# Patient Record
Sex: Male | Born: 1955 | Race: Black or African American | Hispanic: No | Marital: Married | Smoking: Former smoker
Health system: Southern US, Community
[De-identification: ages and names within clinical notes are randomized; demographics above are authoritative.]

---

## 1998-05-21 ENCOUNTER — Emergency Department (HOSPITAL_COMMUNITY): Admission: EM | Admit: 1998-05-21 | Discharge: 1998-05-21 | Payer: Self-pay | Admitting: Emergency Medicine

## 1998-05-21 ENCOUNTER — Encounter: Payer: Self-pay | Admitting: Emergency Medicine

## 1998-08-21 ENCOUNTER — Inpatient Hospital Stay (HOSPITAL_COMMUNITY): Admission: EM | Admit: 1998-08-21 | Discharge: 1998-08-31 | Payer: Self-pay | Admitting: Emergency Medicine

## 1998-08-21 ENCOUNTER — Encounter: Payer: Self-pay | Admitting: Emergency Medicine

## 1998-09-10 ENCOUNTER — Inpatient Hospital Stay (HOSPITAL_COMMUNITY): Admission: EM | Admit: 1998-09-10 | Discharge: 1998-09-25 | Payer: Self-pay | Admitting: Emergency Medicine

## 1998-09-10 ENCOUNTER — Encounter: Admission: RE | Admit: 1998-09-10 | Discharge: 1998-09-10 | Payer: Self-pay | Admitting: Internal Medicine

## 1998-09-10 ENCOUNTER — Ambulatory Visit (HOSPITAL_COMMUNITY): Admission: RE | Admit: 1998-09-10 | Discharge: 1998-09-10 | Payer: Self-pay | Admitting: Internal Medicine

## 1998-10-01 ENCOUNTER — Encounter: Admission: RE | Admit: 1998-10-01 | Discharge: 1998-10-01 | Payer: Self-pay

## 1998-11-12 ENCOUNTER — Encounter: Admission: RE | Admit: 1998-11-12 | Discharge: 1998-11-12 | Payer: Self-pay | Admitting: Internal Medicine

## 1999-01-21 ENCOUNTER — Ambulatory Visit (HOSPITAL_COMMUNITY): Admission: RE | Admit: 1999-01-21 | Discharge: 1999-01-21 | Payer: Self-pay | Admitting: Internal Medicine

## 1999-01-21 ENCOUNTER — Encounter: Admission: RE | Admit: 1999-01-21 | Discharge: 1999-01-21 | Payer: Self-pay | Admitting: Internal Medicine

## 1999-02-18 ENCOUNTER — Encounter: Admission: RE | Admit: 1999-02-18 | Discharge: 1999-02-18 | Payer: Self-pay | Admitting: Internal Medicine

## 1999-03-05 ENCOUNTER — Emergency Department (HOSPITAL_COMMUNITY): Admission: EM | Admit: 1999-03-05 | Discharge: 1999-03-05 | Payer: Self-pay | Admitting: *Deleted

## 1999-03-05 ENCOUNTER — Encounter: Payer: Self-pay | Admitting: *Deleted

## 1999-04-22 ENCOUNTER — Encounter: Admission: RE | Admit: 1999-04-22 | Discharge: 1999-04-22 | Payer: Self-pay | Admitting: Internal Medicine

## 1999-04-22 ENCOUNTER — Ambulatory Visit (HOSPITAL_COMMUNITY): Admission: RE | Admit: 1999-04-22 | Discharge: 1999-04-22 | Payer: Self-pay | Admitting: Internal Medicine

## 1999-04-27 ENCOUNTER — Inpatient Hospital Stay (HOSPITAL_COMMUNITY): Admission: EM | Admit: 1999-04-27 | Discharge: 1999-05-02 | Payer: Self-pay | Admitting: Emergency Medicine

## 1999-04-27 ENCOUNTER — Encounter: Payer: Self-pay | Admitting: Internal Medicine

## 1999-08-28 ENCOUNTER — Inpatient Hospital Stay (HOSPITAL_COMMUNITY): Admission: EM | Admit: 1999-08-28 | Discharge: 1999-08-31 | Payer: Self-pay | Admitting: Emergency Medicine

## 1999-08-28 ENCOUNTER — Encounter: Payer: Self-pay | Admitting: Internal Medicine

## 1999-08-29 ENCOUNTER — Encounter: Payer: Self-pay | Admitting: Internal Medicine

## 1999-10-19 ENCOUNTER — Inpatient Hospital Stay (HOSPITAL_COMMUNITY): Admission: EM | Admit: 1999-10-19 | Discharge: 1999-10-22 | Payer: Self-pay | Admitting: *Deleted

## 1999-10-19 ENCOUNTER — Encounter: Payer: Self-pay | Admitting: Emergency Medicine

## 1999-11-10 ENCOUNTER — Inpatient Hospital Stay (HOSPITAL_COMMUNITY): Admission: EM | Admit: 1999-11-10 | Discharge: 1999-11-13 | Payer: Self-pay | Admitting: Emergency Medicine

## 1999-11-10 ENCOUNTER — Encounter: Payer: Self-pay | Admitting: Emergency Medicine

## 1999-11-14 ENCOUNTER — Inpatient Hospital Stay (HOSPITAL_COMMUNITY): Admission: EM | Admit: 1999-11-14 | Discharge: 1999-11-20 | Payer: Self-pay | Admitting: Emergency Medicine

## 1999-11-25 ENCOUNTER — Encounter: Payer: Self-pay | Admitting: Emergency Medicine

## 1999-11-25 ENCOUNTER — Inpatient Hospital Stay (HOSPITAL_COMMUNITY): Admission: EM | Admit: 1999-11-25 | Discharge: 1999-11-29 | Payer: Self-pay | Admitting: *Deleted

## 1999-11-25 ENCOUNTER — Encounter: Payer: Self-pay | Admitting: *Deleted

## 1999-11-28 ENCOUNTER — Encounter: Payer: Self-pay | Admitting: Internal Medicine

## 2020-03-02 ENCOUNTER — Emergency Department (HOSPITAL_COMMUNITY)
Admission: EM | Admit: 2020-03-02 | Discharge: 2020-03-03 | Disposition: A | Discharge status: Home / Self Care | Payer: Medicaid Other | Attending: Emergency Medicine | Admitting: Emergency Medicine

## 2020-03-02 DIAGNOSIS — F10129 Alcohol abuse with intoxication, unspecified: Secondary | ICD-10-CM | POA: Diagnosis not present

## 2020-03-02 DIAGNOSIS — F1010 Alcohol abuse, uncomplicated: Secondary | ICD-10-CM

## 2020-03-02 DIAGNOSIS — U071 COVID-19: Secondary | ICD-10-CM | POA: Diagnosis not present

## 2020-03-02 DIAGNOSIS — T50901A Poisoning by unspecified drugs, medicaments and biological substances, accidental (unintentional), initial encounter: Secondary | ICD-10-CM

## 2020-03-02 DIAGNOSIS — T40601A Poisoning by unspecified narcotics, accidental (unintentional), initial encounter: Secondary | ICD-10-CM | POA: Diagnosis present

## 2020-03-02 NOTE — ED Provider Notes (Signed)
[facility] EMERGENCY DEPARTMENT Provider Note   CSN: [id number] Arrival date & time: 03/02/20  2341     History Chief Complaint - overdose  Level 5 caveat due to altered mental status  Ricky [name] is a 65 y.o. male.  The history is provided by the EMS personnel and the patient. The history is limited by the condition of the patient.  Drug Overdose This is a new problem. The problem occurs constantly. The problem has been gradually improving. Nothing aggravates the symptoms. Relieved by: Narcan.  Patient presents for presumed opiate overdose.  EMS reports they were called to the house by family and patient was unresponsive.  He may have received compressions from family.  EMS reports the patient had agonal respirations and was given assisted ventilation and then given Narcan.  After several minutes he began to wake up and became mildly agitated  Patient reports he "likes to party" but is unable to provide any other details and appears intoxicated        PMH-unknown Soc hx - unknown Home Medications Prior to Admission medications   Not on File    Allergies    Patient has no allergy information on record.  Review of Systems   Review of Systems  Unable to perform ROS: Mental status change    Physical Exam Updated Vital Signs There were no vitals taken for this visit.  Physical Exam CONSTITUTIONAL: Disheveled, mildly anxious HEAD: Normocephalic/atraumatic EYES: Pinpoint pupils bilaterally ENMT: Mucous membranes moist NECK: supple no meningeal signs SPINE/BACK:entire spine nontender CV: S1/S2 noted, no murmurs/rubs/gallops noted LUNGS: Lungs are clear to auscultation bilaterally, no apparent distress Chest-no tenderness or crepitus ABDOMEN: soft, nontender, no rebound or guarding, bowel sounds noted throughout abdomen GU:no cva tenderness NEURO: Pt is awake/alert, moves all extremitiesx4.  No facial droop.  Patient is slurring his speech and  appears intoxicated EXTREMITIES: pulses normal/equal, full ROM, no deformities SKIN: warm, color normal PSYCH: Anxious  ED Results / Procedures / Treatments   Labs (all labs ordered are listed, but only abnormal results are displayed) Labs Reviewed  SARS CORONAVIRUS 2 BY RT PCR (HOSPITAL ORDER, [facility]) - Abnormal; Notable for the following components:      Result Value   SARS Coronavirus 2 POSITIVE (*)    All other components within normal limits  COMPREHENSIVE METABOLIC PANEL - Abnormal; Notable for the following components:   Glucose, Bld 150 (*)    Calcium 8.7 (*)    Total Protein 8.2 (*)    Albumin 3.3 (*)    AST 45 (*)    Alkaline Phosphatase 132 (*)    All other components within normal limits  SALICYLATE LEVEL - Abnormal; Notable for the following components:   Salicylate Lvl <5.9 (*)    All other components within normal limits  ACETAMINOPHEN LEVEL - Abnormal; Notable for the following components:   Acetaminophen (Tylenol), Serum <10 (*)    All other components within normal limits  ETHANOL - Abnormal; Notable for the following components:   Alcohol, Ethyl (B) 124 (*)    All other components within normal limits  RAPID URINE DRUG SCREEN, HOSP PERFORMED - Abnormal; Notable for the following components:   Cocaine POSITIVE (*)    Amphetamines POSITIVE (*)    All other components within normal limits  CBC WITH DIFFERENTIAL/PLATELET - Abnormal; Notable for the following components:   RBC 3.95 (*)    MCH 34.4 (*)    Lymphs Abs 0.5 (*)  Abs Immature Granulocytes 0.08 (*)    All other components within normal limits    EKG EKG Interpretation  Date/Time:  Saturday March 03 2020 00:06:25 EST Ventricular Rate:  85 PR Interval:    QRS Duration: 84 QT Interval:  372 QTC Calculation: 443 R Axis:   81 Text Interpretation: Sinus rhythm Borderline right axis deviation Borderline repolarization abnormality No significant change since last  tracing Confirmed by Ripley Fraise [phone]) on 03/03/2020 12:12:29 AM   Radiology DG Chest Port 1 View  Result Date: 03/03/2020 CLINICAL DATA:  Pt states has had dizziness, chest pain, shortness of breath and possibly a fever for a week. EXAM: PORTABLE CHEST 1 VIEW.  Patient is rotated. COMPARISON:  Chest x-ray 10/19/1999 report without images FINDINGS: The heart size and mediastinal contours are within normal limits. Aortic arch calcifications. Left upper lobe patchy airspace opacities. Associated bronchial wall cuffing. No focal consolidation. No pulmonary edema. No pleural effusion. No pneumothorax. No acute osseous abnormality. IMPRESSION: Left upper lobe findings likely represent aspiration pneumonia. Electronically Signed   By: Iven Finn M.D.   On: 03/03/2020 00:28    Procedures Procedures   Medications Ordered in ED Medications  remdesivir 200 mg in sodium chloride 0.9% 250 mL IVPB (has no administration in time range)    Followed by  remdesivir 100 mg in sodium chloride 0.9 % 100 mL IVPB (has no administration in time range)  diphenhydrAMINE (BENADRYL) capsule 25 mg (25 mg Oral Given 03/03/20 0228)    ED Course  I have reviewed the triage vital signs and the nursing notes.  Pertinent labs & imaging results that were available during my care of the patient were reviewed by me and considered in my medical decision making (see chart for details).    MDM Rules/Calculators/A&P                          11:59 PM Patient presents after presumed narcotic overdose as he improved with Narcan.  He is awake alert and speaking but appears intoxicated.  Will obtain labs and chest x-ray since he may have received CPR.  We will continue to monitor at this time Prehospital EKG was reviewed and no acute findings 2:18 AM Patient is now awake and alert.  His speech is much more clear.  He is not agitated.  He does report itching throughout his body, will give Benadryl.  X-ray findings are  noted.  Patient will be tested for COVID-19 5:32 AM Patient positive for COVID-19.  This is likely the cause of his pneumonia on CXR.  Overall he feels improved.  He walks around the room in no acute distress. No cp/sob is reported He has no significant sustained hypoxia.  On room air while at rest he is in the mid 90s. He now tells me that he was vaccinated for Covid with [place] vaccine last year I feel he is appropriate for discharge home.  Will give 1 dose of remdesivir and refer to outpatient infusion center Patient advised to isolate for the next 5 days as he is unsure when his symptoms started. Counseled patient to avoid illegal substances  Ricky [name] was evaluated in Emergency Department on 03/03/2020 for the symptoms described in the history of present illness. He was evaluated in the context of the global COVID-19 pandemic, which necessitated consideration that the patient might be at risk for infection with the SARS-CoV-2 virus that causes COVID-19. Institutional protocols and algorithms  that pertain to the evaluation of patients at risk for COVID-19 are in a state of rapid change based on information released by regulatory bodies including the CDC and federal and state organizations. These policies and algorithms were followed during the patient's care in the ED.  Final Clinical Impression(s) / ED Diagnoses Final diagnoses:  Accidental drug overdose, initial encounter  Alcohol abuse  COVID-19    Rx / DC Orders ED Discharge Orders    None       Ripley Fraise, MD 03/03/20 [phone]

## 2020-03-03 ENCOUNTER — Emergency Department (HOSPITAL_COMMUNITY): Payer: Medicaid Other

## 2020-03-03 ENCOUNTER — Other Ambulatory Visit (HOSPITAL_COMMUNITY): Payer: Self-pay

## 2020-03-03 LAB — CBC WITH DIFFERENTIAL/PLATELET
Abs Immature Granulocytes: 0.08 10*3/uL — ABNORMAL HIGH (ref 0.00–0.07)
Basophils Absolute: 0 10*3/uL (ref 0.0–0.1)
Basophils Relative: 0 %
Eosinophils Absolute: 0.1 10*3/uL (ref 0.0–0.5)
Eosinophils Relative: 3 %
HCT: 39.5 % (ref 39.0–52.0)
Hemoglobin: 13.6 g/dL (ref 13.0–17.0)
Immature Granulocytes: 2 %
Lymphocytes Relative: 12 %
Lymphs Abs: 0.5 10*3/uL — ABNORMAL LOW (ref 0.7–4.0)
MCH: 34.4 pg — ABNORMAL HIGH (ref 26.0–34.0)
MCHC: 34.4 g/dL (ref 30.0–36.0)
MCV: 100 fL (ref 80.0–100.0)
Monocytes Absolute: 0.5 10*3/uL (ref 0.1–1.0)
Monocytes Relative: 11 %
Neutro Abs: 3.1 10*3/uL (ref 1.7–7.7)
Neutrophils Relative %: 72 %
Platelets: 311 10*3/uL (ref 150–400)
RBC: 3.95 MIL/uL — ABNORMAL LOW (ref 4.22–5.81)
RDW: 12.6 % (ref 11.5–15.5)
WBC: 4.3 10*3/uL (ref 4.0–10.5)
nRBC: 0 % (ref 0.0–0.2)

## 2020-03-03 LAB — COMPREHENSIVE METABOLIC PANEL
ALT: 22 U/L (ref 0–44)
AST: 45 U/L — ABNORMAL HIGH (ref 15–41)
Albumin: 3.3 g/dL — ABNORMAL LOW (ref 3.5–5.0)
Alkaline Phosphatase: 132 U/L — ABNORMAL HIGH (ref 38–126)
Anion gap: 12 (ref 5–15)
BUN: 10 mg/dL (ref 8–23)
CO2: 23 mmol/L (ref 22–32)
Calcium: 8.7 mg/dL — ABNORMAL LOW (ref 8.9–10.3)
Chloride: 102 mmol/L (ref 98–111)
Creatinine, Ser: 0.96 mg/dL (ref 0.61–1.24)
GFR, Estimated: >60 mL/min (ref >60)
Glucose, Bld: 150 mg/dL — ABNORMAL HIGH (ref 70–99)
Potassium: 3.5 mmol/L (ref 3.5–5.1)
Sodium: 137 mmol/L (ref 135–145)
Total Bilirubin: 0.7 mg/dL (ref 0.3–1.2)
Total Protein: 8.2 g/dL — ABNORMAL HIGH (ref 6.5–8.1)

## 2020-03-03 LAB — ACETAMINOPHEN LEVEL: Acetaminophen (Tylenol), Serum: <10 ug/mL — ABNORMAL LOW (ref 10–30)

## 2020-03-03 LAB — RAPID URINE DRUG SCREEN, HOSP PERFORMED
Amphetamines: POSITIVE — AB
Barbiturates: NOT DETECTED
Benzodiazepines: NOT DETECTED
Cocaine: POSITIVE — AB
Opiates: NOT DETECTED
Tetrahydrocannabinol: NOT DETECTED

## 2020-03-03 LAB — SALICYLATE LEVEL: Salicylate Lvl: <7 mg/dL — ABNORMAL LOW (ref 7.0–30.0)

## 2020-03-03 LAB — ETHANOL: Alcohol, Ethyl (B): 124 mg/dL — ABNORMAL HIGH (ref <10)

## 2020-03-03 LAB — SARS CORONAVIRUS 2 BY RT PCR (HOSPITAL ORDER, PERFORMED IN ~~LOC~~ HOSPITAL LAB): SARS Coronavirus 2: POSITIVE — AB

## 2020-03-03 IMAGING — DX DG CHEST 1V PORT
1 series · 1 of 1 positions shown · non-contrast
Comparison: Chest x-ray [DATE] report without images

CLINICAL DATA: Pt states has had dizziness, chest pain, shortness
of breath and possibly a fever for a week.

EXAM:
PORTABLE CHEST 1 VIEW.  Patient is rotated.

[chest]
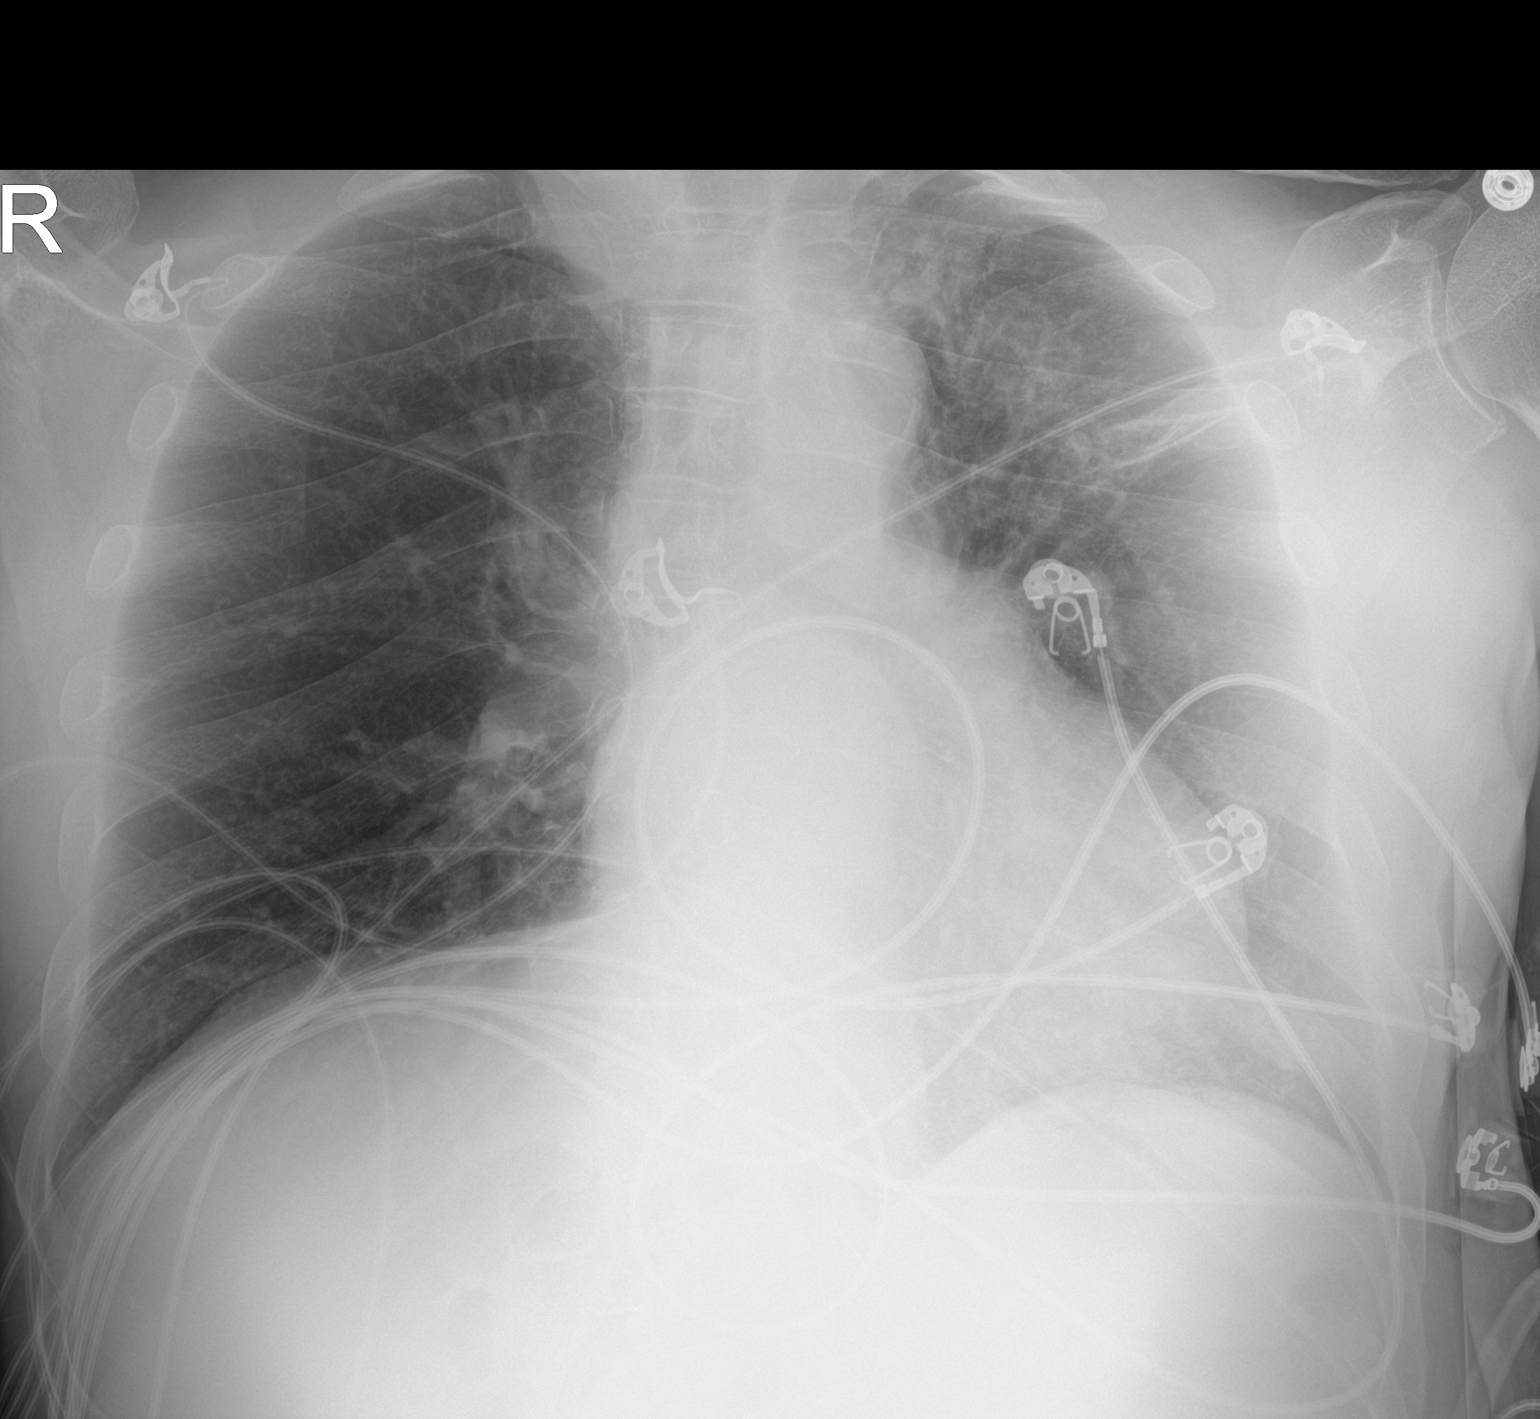

[1 of 1 positions shown; findings below may reference images not displayed]

FINDINGS: The heart size and mediastinal contours are within normal limits.
Aortic arch calcifications.

Left upper lobe patchy airspace opacities. Associated bronchial wall
cuffing. No focal consolidation. No pulmonary edema. No pleural
effusion. No pneumothorax.

No acute osseous abnormality.
IMPRESSION: Left upper lobe findings likely represent aspiration pneumonia.

## 2020-03-03 MED ORDER — DIPHENHYDRAMINE HCL 25 MG PO CAPS
25.0000 mg | ORAL_CAPSULE | Freq: Once | ORAL | Status: AC
  Administered 2020-03-03: 25 mg via ORAL
  Filled 2020-03-03: qty 1

## 2020-03-03 MED ORDER — SODIUM CHLORIDE 0.9 % IV SOLN: 100.0000 mg | Freq: Every day | INTRAVENOUS | Status: DC

## 2020-03-03 MED ORDER — DIPHENHYDRAMINE HCL 50 MG/ML IJ SOLN: 25.0000 mg | Freq: Once | INTRAMUSCULAR | Status: DC

## 2020-03-03 MED ORDER — NYSTATIN 100000 UNIT/ML MT SUSP: 500000.0000 [IU] | Freq: Four times a day (QID) | OROMUCOSAL | 0 refills | Status: DC

## 2020-03-03 MED ORDER — SODIUM CHLORIDE 0.9 % IV SOLN
200.0000 mg | Freq: Once | INTRAVENOUS | Status: AC
  Administered 2020-03-03: 200 mg via INTRAVENOUS
  Filled 2020-03-03: qty 200

## 2020-03-03 NOTE — ED Triage Notes (Signed)
Pt BIB EMS for an overdose, unknown substance. EMS reports that pt was found unresponsive and not breathing, strong carotid pulse. Bagged on scene and given IM narcan 2mg . Pt woke back up after around 6 minutes.  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 BP 152/100 Pulse 80s 94% RA  CBG 233

## 2020-03-03 NOTE — ED Notes (Signed)
This RN ambulated pt in room and monitored his O2. While pt was sitting at bed side his O2 was 93%, when pt ambulated in room his O2 was at 91%. Pt reported that he felt a little lightheaded. When pt sat back down at bed side his O2 was 95%.

## 2020-03-03 NOTE — ED Notes (Signed)
Patient was given his brothers number [name] [phone]. States he lives in the shelter

## 2020-03-03 NOTE — Discharge Instructions (Signed)
Substance Abuse Treatment Programs ° °Intensive Outpatient Programs °[facility]     °[address]      °[place], [place]                   °[phone]      ° °[facility] °[address] °[place], [place] °[phone] ° °[facility]     °(Inpatient and outpatient)     °[address]           °[phone]   ° °[facility] °[phone] (Suboxone and Methadone) ° °[address]      °[place], [place] [address]      °[phone]      ° °[address] °[place], [place] °852-3033 ° °[facility] (Outpatient/Inpatient, Chemical)    °(insurance only) [phone]      °       °[facility] (Groups & Residential) °[place], [place] °[phone] ° °   °[facility]     °[address]     °[place], [place]      °[phone]      ° °[facility] (for caregivers and family) °[address] °[place], [place] °336-299-4655 ° ° ° ° ° °Residential Treatment Programs °[facility]      °[address], [place], [place] [address]  °([phone]      ° °[facility] °[address] [place], [place] [address] °[phone] ° °[facility]        °[phone]      ° °[facility] °1-800-659-3381 ° °[facility]             °[address]                                         °[place], [place]                                                °877-615-2722 or 336-784-9470                              ° °[facility] [place] °[address] °[place], [address] °[phone] ° °[facility]    °[address]      °[place], [place]     °[phone]      ° °[facility] °[address] °, [place] °336-285-9073 ° °[facility]   °[address]     °[place], [place] [address]     °[phone]      °Admissions: 8am-3pm M-F ° °[facility] °[address] °[place],   °336-227-7417 ° °[facility]: Residential Program (90 Days)   °[place],       °336-725-8389 or [phone]    ° °[facility] °[place],  °(Walk in Hours over the weekend or by referral) ° °[facility] °[address], [place],  [address] °([phone] ° °Crisis Mobile: [facility]:  1-877-626-1772 (for crisis response 24 hours a day) °[facility] Hotline:      1-800-256-2452 °Outpatient Psychiatry and Counseling ° °[facility]: Mobile Crisis   Management 24 hours:  1-877-626-1772 ° °[facility] sliding scale fee and walk in schedule: M-F 8am-12pm/1pm-3pm °[address]  °[place], [place] [address] °[phone] ° °[facility] °[address] °[place], [place] [address] °336-703-9650 ° °[facility] (Formerly known [facility])- new patient walk-in appointments available Monday - Friday 8am -3pm.          °[address] °[place], [place] [address] °336-676-6840 or crisis line- 336-676-6905 ° °[facility] Intensive Outpatient Therapy Program °[address] °[place], [place] [address] °336-832-9804 ° °[facility]                  °Crisis Services      °[phone]      °[address]     °[place], [place] [address]                ° °[facility]   °[facility] °[phone] °[address] °[place], [place] [address] ° ° °[facility]          °[address],  °[place], [place] [address]       °([phone] ° °[facility] °[address] °[place], [place] [address] °336-292-1510 ° °[facility]    °[address]    °[place], [place] [address]     °[phone]      ° °Parish McKinney, [place]     °[address]     °[place] [address]     °336-282-1251     °  °[facility] °[address] °[place] [address] ° °[facility]     °[address]     °[place], [place]      °336-542-2076      ° °[facility] °Shamsher Ahluwalia, MD °[address] °[place], [place] [address] °[phone] ° °[facility]     °[address]     °[place], [place] [address]     °336-274-1237      ° °[facility] °[address] °[place], [place] [address] °336-854-4450 °Resources for Temporary Residential Assistance/Crisis Centers ° °DAY CENTERS °[facility] °M-F 8am-3pm   °[address] [place], [place] [address]   336-332-0824 °Services include: laundry, barbering, support groups, case management, phone  & computer access, showers, AA/NA mtgs, mental health/substance abuse nurse, job skills class, disability information, VA assistance, spiritual classes, etc.  ° °HOMELESS SHELTERS ° °[facility]     °[facility]   °[address], [place]     °[phone]       °       °[facility] (women and children)       °[address] °[place], [place] [address] °336-275-0820 °[email or web address] for application and process °Application Required ° °[facility]   °[address]    °[place] [address]     °[phone]       °             °[facility] °[address] °, [place] [address] °[phone] °336-235-0363(schedule application appt.) °Application Required ° °[facility] (women only)    °[address]     °[place], [place] [address]     °336-884-1039      °  Intake starts 6pm daily °Need valid ID, SSC, & Police report °[facility] [place] °[address] °[place], [place] °336-881-5420 °Application Required ° °[facility] (men only)     °[address]      °[place], [place]     °[phone]      ° °[facility] °(Pregnant women only) °[address] °[place], [place] °336-275-0206 ° °[facility]      °[name]      °[place], [place] [address]     °[phone]      °       °[facility] °[address] °[place], [place] °336-723-1848 °90 day commitment/SA/Application process ° °[facility](men only)     °[name]     °[place], [place]     °[phone]       °Check-in at 7pm     °       °[facility] °[address] °[place], [place] [address] °336-248-6684 °Men/Women/Women and Children must be there by 7 pm ° °[facility] °[place], [place] °336-722-8721                ° °

## 2020-03-21 ENCOUNTER — Emergency Department (HOSPITAL_COMMUNITY): Payer: Medicaid Other

## 2020-03-21 ENCOUNTER — Emergency Department (HOSPITAL_COMMUNITY)
Admission: EM | Admit: 2020-03-21 | Discharge: 2020-03-21 | Disposition: A | Discharge status: Home / Self Care | Payer: Medicaid Other | Attending: Emergency Medicine | Admitting: Emergency Medicine

## 2020-03-21 DIAGNOSIS — J189 Pneumonia, unspecified organism: Secondary | ICD-10-CM

## 2020-03-21 DIAGNOSIS — U071 COVID-19: Secondary | ICD-10-CM

## 2020-03-21 DIAGNOSIS — J1282 Pneumonia due to coronavirus disease 2019: Secondary | ICD-10-CM | POA: Insufficient documentation

## 2020-03-21 DIAGNOSIS — R0981 Nasal congestion: Secondary | ICD-10-CM | POA: Diagnosis present

## 2020-03-21 IMAGING — CR DG CHEST 2V
2 series · 3 of 3 positions shown · non-contrast
Comparison: [DATE]

CLINICAL DATA: Cough, recent COVID pneumonia, sinus pressure and
pain, dizziness, smoker, symptoms for 6 days

EXAM:
CHEST - 2 VIEW

[chest lat]
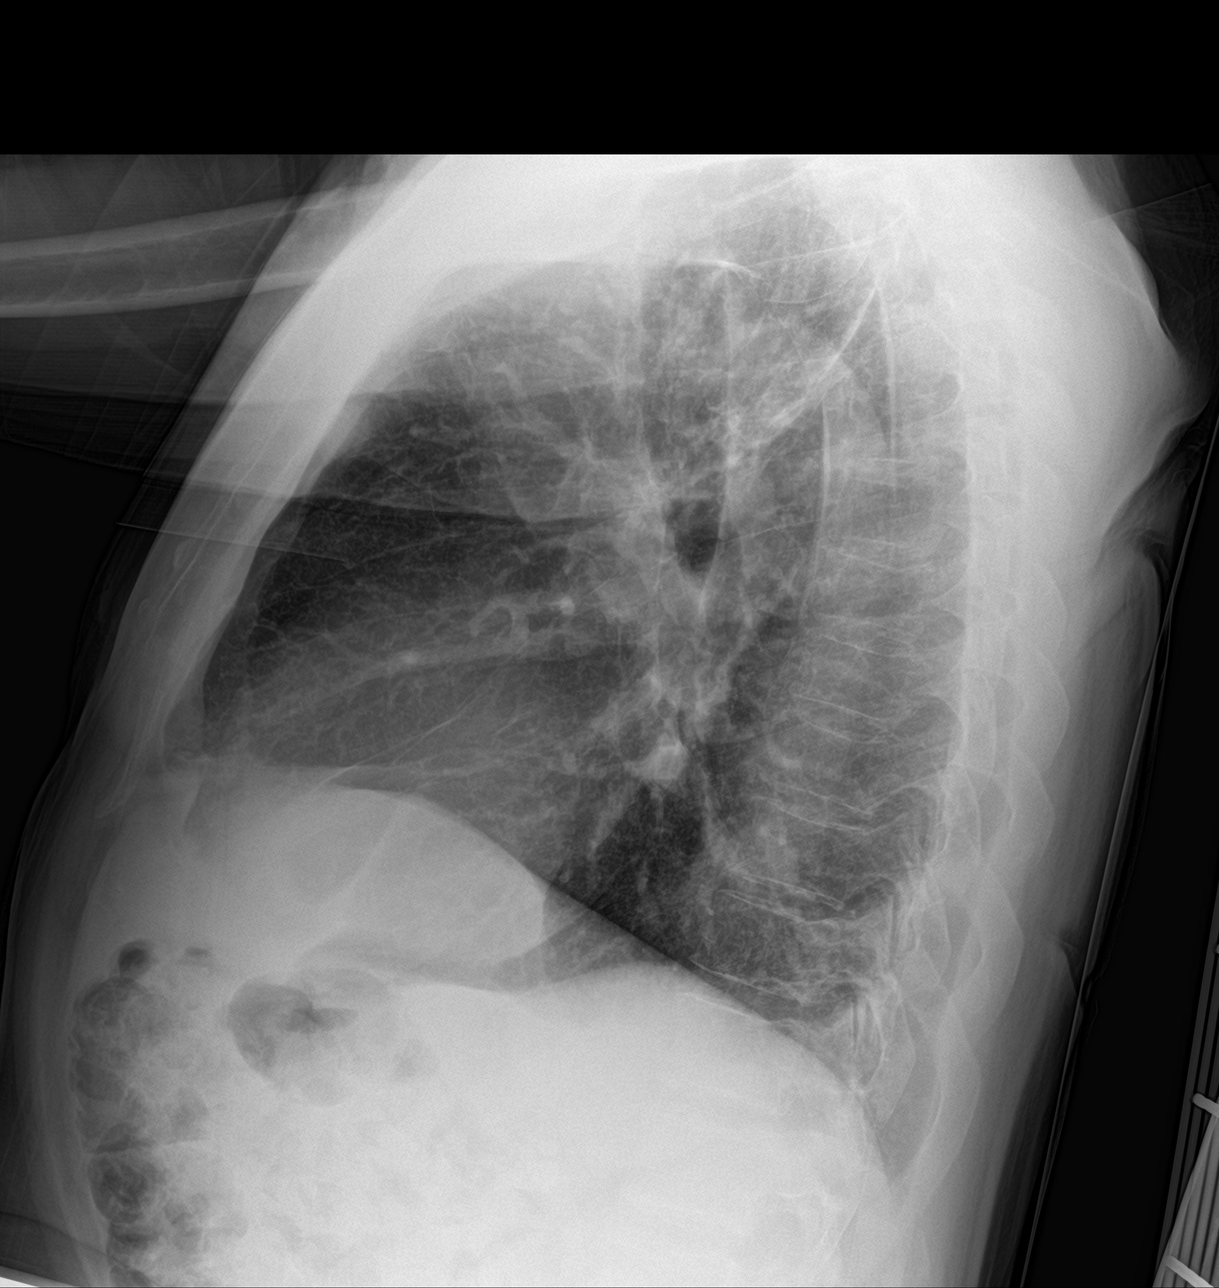

[Series 3: chest ap · 0.14mm/px · 2 of 2 slices shown]
[im 1/2]
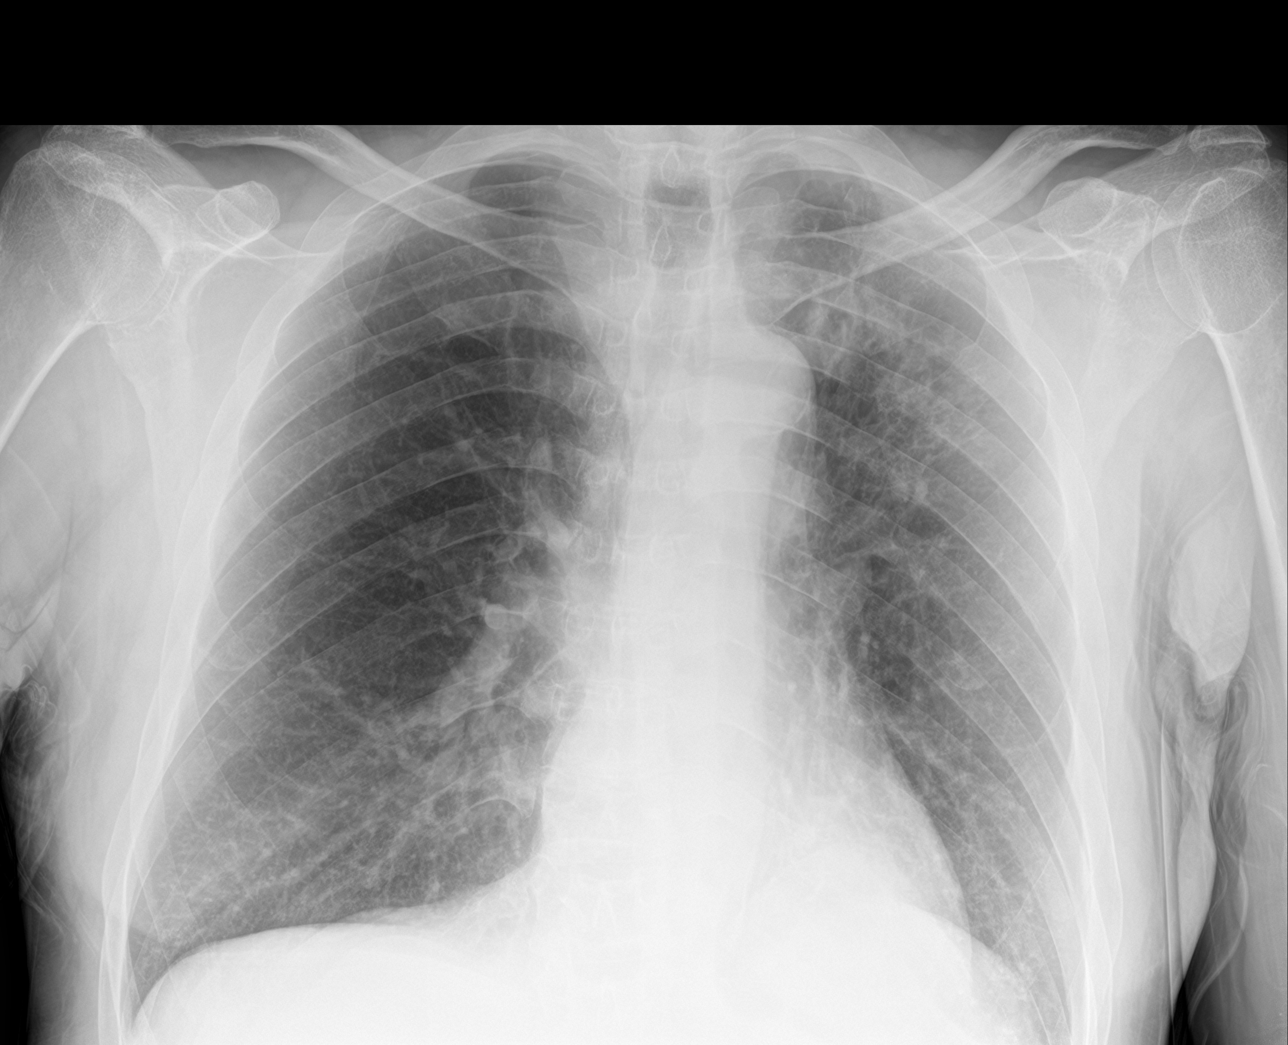
[im 2/2]
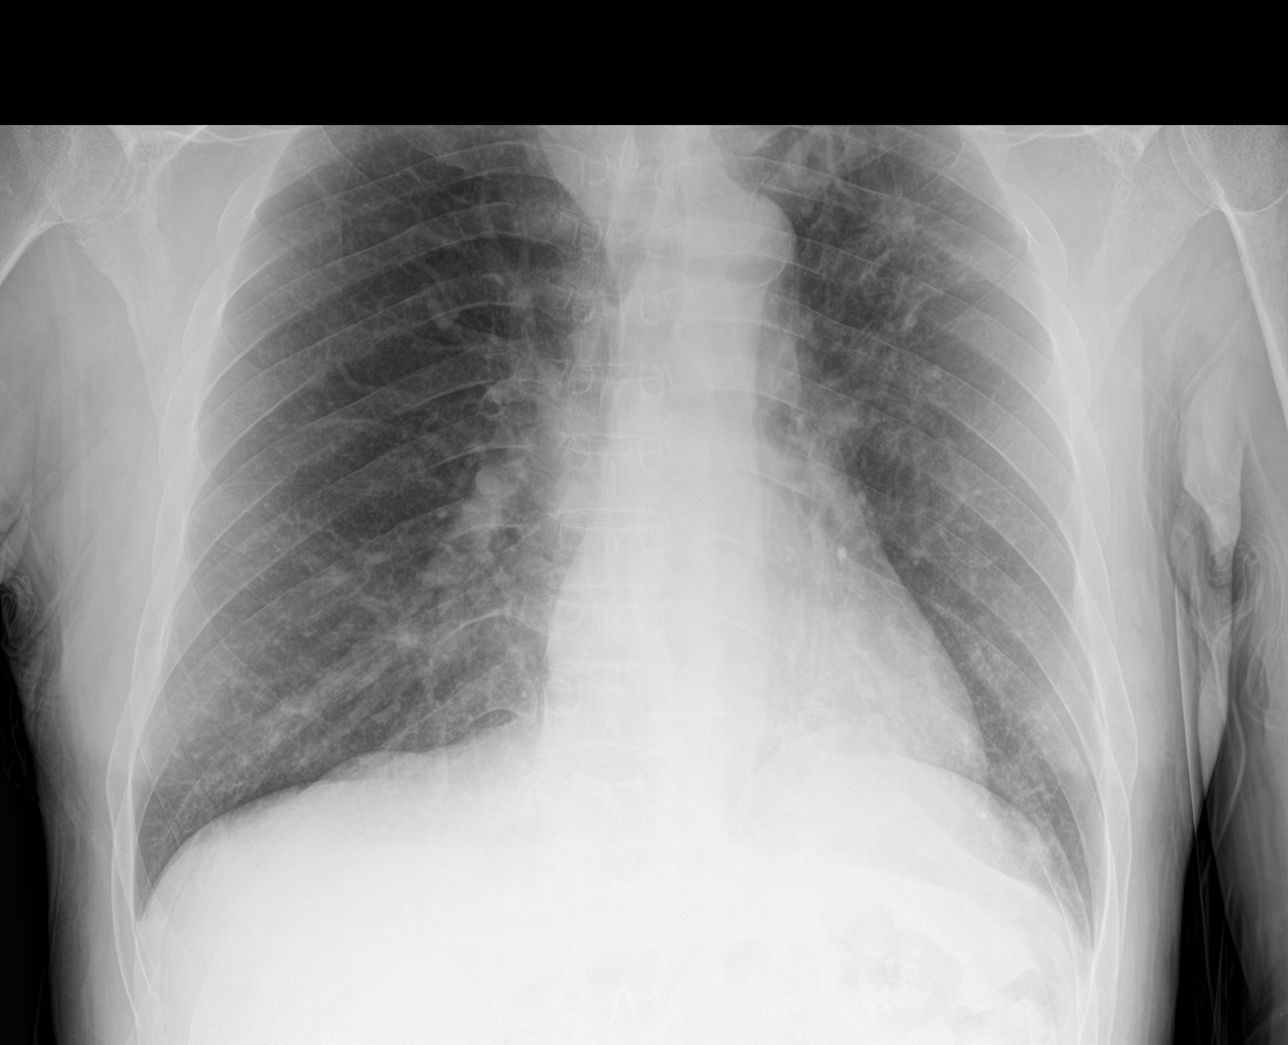

[3 of 3 positions shown; findings below may reference images not displayed]

FINDINGS: Normal heart size, mediastinal contours, and pulmonary vascularity.

Atherosclerotic calcification aorta.

Persistent LEFT upper lobe infiltrate.

Increased markings at lung bases which could reflect atelectasis or
infiltrate.

No pleural effusion or pneumothorax.

Bones demineralized.
IMPRESSION: Persistent LEFT upper lobe infiltrate consistent with pneumonia.

Mild atelectasis versus infiltrate at lung bases.

## 2020-03-21 MED ORDER — ACETAMINOPHEN 500 MG PO TABS
1000.0000 mg | ORAL_TABLET | Freq: Once | ORAL | Status: AC
  Administered 2020-03-21: 1000 mg via ORAL
  Filled 2020-03-21: qty 2

## 2020-03-21 MED ORDER — AMOXICILLIN-POT CLAVULANATE 875-125 MG PO TABS
1.0000 | ORAL_TABLET | Freq: Once | ORAL | Status: AC
  Administered 2020-03-21: 1 via ORAL
  Filled 2020-03-21: qty 1

## 2020-03-21 MED ORDER — BENZONATATE 100 MG PO CAPS: 100.0000 mg | ORAL_CAPSULE | Freq: Three times a day (TID) | ORAL | 0 refills | Status: DC

## 2020-03-21 MED ORDER — AMOXICILLIN-POT CLAVULANATE 875-125 MG PO TABS: 1.0000 | ORAL_TABLET | Freq: Two times a day (BID) | ORAL | 0 refills | Status: DC

## 2020-03-21 NOTE — Discharge Instructions (Addendum)
You have been evaluated for your discomfort.  A repeat chest x-ray shows pneumonia on the left side of your lung.  This is likely due to Covid infection but sometimes you may develop a superimposed bacterial infection.  Take antibiotic as prescribed as treatment for your pneumonia and sinusitis.  Take Tessalon as needed for cough.  Follow instruction below.  Recommendations for at home COVID-19 symptoms management:  Please continue isolation at home. Call [phone] to see whether you might be eligible for therapeutic antibody infusions (leave your name and they will call you back).  If have acute worsening of symptoms please go to ER/urgent care for further evaluation. Check pulse oximetry and if below 90-92% please go to ER. The following supplements MAY help:  Vitamin C 500mg  twice a day and Quercetin 250-500 mg twice a day Vitamin D3 2000 - 4000 u/day B Complex vitamins Zinc 75-100 mg/day Melatonin 6-10 mg at night (the optimal dose is unknown) Aspirin 81mg /day (if no history of bleeding issues)

## 2020-03-21 NOTE — ED Triage Notes (Signed)
Pt reports sinuitis for one month with nasal congestion and nasal drainage. Over the last 1 week he also has a headache that is worse with coughing or bending over.   He has been using OTC cold medication and motrin with no relief. Productive cough with thick white mucus.  * added at end of triage Recent covid in january.

## 2020-03-21 NOTE — ED Provider Notes (Signed)
[facility] EMERGENCY DEPARTMENT Provider Note   CSN: [id number] Arrival date & time: 03/21/20  1030     History Chief Complaint  Patient presents with  . Nasal Congestion    Ricky [name] is a 65 y.o. male.  The history is provided by the patient and medical records. No language interpreter was used.     65 year old male previously diagnosed positive for Covid infection less than a month ago who presents complaining of sinus congestion and cough.  Patient report for the past 2-week he has had fever, chills, sinus congestion, headache, recurrent cough, pain in the left side of his chest.  Symptoms moderate in severity, mildly improved with alternating between Tylenol and ibuprofen as well as over-the-counter sinus medication.  He was diagnosed with Covid infection about 2 weeks ago.  He has had [employer] vaccination last year.  He admits to tobacco and alcohol use.  He also endorsed some dizziness from his congestion.  He denies any significant shortness of breath, loss of taste or smell, vomiting or diarrhea, focal numbness or focal weakness.  No past medical history on file.  There are no problems to display for this patient.   The histories are not reviewed yet. Please review them in the "History" navigator section and refresh this [place].     No family history on file.     Home Medications Prior to Admission medications   Medication Sig Start Date End Date Taking? Authorizing Provider  nystatin (MYCOSTATIN) 100000 UNIT/ML suspension Take 5 mLs (500,000 Units total) by mouth 4 (four) times daily. 03/03/20   Blanchie Dessert, MD    Allergies    Patient has no known allergies.  Review of Systems   Review of Systems  All other systems reviewed and are negative.   Physical Exam Updated Vital Signs BP (!) 150/101 (BP Location: Left Arm)   Pulse 73   Temp 98.6 F (37 C) (Oral)   Resp 17   SpO2 95%   Physical Exam Vitals and  nursing note reviewed.  Constitutional:      General: He is not in acute distress.    Appearance: He is well-developed and well-nourished.  HENT:     Head: Atraumatic.     Comments: Tenderness to percussion of frontal and maxillary sinus    Nose: Nose normal.     Mouth/Throat:     Mouth: Mucous membranes are moist.  Eyes:     Extraocular Movements: Extraocular movements intact.     Conjunctiva/sclera: Conjunctivae normal.     Pupils: Pupils are equal, round, and reactive to light.  Cardiovascular:     Rate and Rhythm: Normal rate and regular rhythm.     Pulses: Normal pulses.     Heart sounds: Normal heart sounds.  Pulmonary:     Effort: Pulmonary effort is normal.     Breath sounds: Normal breath sounds. No wheezing, rhonchi or rales.  Abdominal:     Palpations: Abdomen is soft.     Tenderness: There is no abdominal tenderness.  Musculoskeletal:     Cervical back: Neck supple.     Right lower leg: No edema.     Left lower leg: No edema.  Skin:    Findings: No rash.  Neurological:     Mental Status: He is alert and oriented to person, place, and time.  Psychiatric:        Mood and Affect: Mood and affect and mood normal.  ED Results / Procedures / Treatments   Labs (all labs ordered are listed, but only abnormal results are displayed) Labs Reviewed - No data to display  EKG None  Radiology DG Chest 2 View  Result Date: 03/21/2020 CLINICAL DATA:  Cough, recent COVID pneumonia, sinus pressure and pain, dizziness, smoker, symptoms for 6 days EXAM: CHEST - 2 VIEW COMPARISON:  03/03/2020 FINDINGS: Normal heart size, mediastinal contours, and pulmonary vascularity. Atherosclerotic calcification aorta. Persistent LEFT upper lobe infiltrate. Increased markings at lung bases which could reflect atelectasis or infiltrate. No pleural effusion or pneumothorax. Bones demineralized. IMPRESSION: Persistent LEFT upper lobe infiltrate consistent with pneumonia. Mild atelectasis  versus infiltrate at lung bases. Electronically Signed   By: Lavonia Dana M.D.   On: 03/21/2020 12:12    Procedures Procedures   Medications Ordered in ED Medications - No data to display  ED Course  I have reviewed the triage vital signs and the nursing notes.  Pertinent labs & imaging results that were available during my care of the patient were reviewed by me and considered in my medical decision making (see chart for details).    MDM Rules/Calculators/A&P                          BP (!) 150/101 (BP Location: Left Arm)   Pulse 73   Temp 98.6 F (37 C) (Oral)   Resp 17   SpO2 95%   Final Clinical Impression(s) / ED Diagnoses Final diagnoses:  Community acquired pneumonia of left upper lobe of lung    Rx / DC Orders ED Discharge Orders         Ordered    amoxicillin-clavulanate (AUGMENTIN) 875-125 MG tablet  2 times daily        03/21/20 1350    benzonatate (TESSALON) 100 MG capsule  Every 8 hours        03/21/20 1350         1:06 PM Patient here with 2 weeks ago of congestion cough as well as subjective fever.  He was diagnosed positive for COVID-19 on 03/03/2020.  He had a chest x-ray at that time that shows a left focal infiltrate.  On repeat chest x-ray it demonstrate a persistent left upper lobe infiltrate consistent with pneumonia.  Since patient still have persistent symptom, will prescribe Augmentin as treatment for potential superimposed bacterial lung infection.  Patient is not hypoxic and is stable for discharge.  Ricky [name] was evaluated in Emergency Department on 03/21/2020 for the symptoms described in the history of present illness. He was evaluated in the context of the global COVID-19 pandemic, which necessitated consideration that the patient might be at risk for infection with the SARS-CoV-2 virus that causes COVID-19. Institutional protocols and algorithms that pertain to the evaluation of patients at risk for COVID-19 are in a state of rapid change  based on information released by regulatory bodies including the CDC and federal and state organizations. These policies and algorithms were followed during the patient's care in the ED.     Domenic Moras, PA-C 03/21/20 1355    Truddie Hidden, MD 03/21/20 1538

## 2020-03-26 ENCOUNTER — Emergency Department (HOSPITAL_COMMUNITY): Payer: Medicaid Other

## 2020-03-26 ENCOUNTER — Inpatient Hospital Stay (HOSPITAL_COMMUNITY)
Admission: EM | Admit: 2020-03-26 | Discharge: 2020-04-25 | DRG: 974 | Disposition: A | Discharge status: Home / Self Care | Payer: Medicaid Other | Attending: Internal Medicine | Admitting: Internal Medicine

## 2020-03-26 ENCOUNTER — Encounter (HOSPITAL_COMMUNITY): Payer: Self-pay | Admitting: Emergency Medicine

## 2020-03-26 DIAGNOSIS — F17200 Nicotine dependence, unspecified, uncomplicated: Secondary | ICD-10-CM

## 2020-03-26 DIAGNOSIS — D72819 Decreased white blood cell count, unspecified: Secondary | ICD-10-CM | POA: Insufficient documentation

## 2020-03-26 DIAGNOSIS — B20 Human immunodeficiency virus [HIV] disease: Secondary | ICD-10-CM | POA: Diagnosis present

## 2020-03-26 DIAGNOSIS — E8809 Other disorders of plasma-protein metabolism, not elsewhere classified: Secondary | ICD-10-CM | POA: Diagnosis present

## 2020-03-26 DIAGNOSIS — F1490 Cocaine use, unspecified, uncomplicated: Secondary | ICD-10-CM | POA: Diagnosis not present

## 2020-03-26 DIAGNOSIS — J432 Centrilobular emphysema: Secondary | ICD-10-CM | POA: Diagnosis present

## 2020-03-26 DIAGNOSIS — E440 Moderate protein-calorie malnutrition: Secondary | ICD-10-CM | POA: Diagnosis present

## 2020-03-26 DIAGNOSIS — E861 Hypovolemia: Secondary | ICD-10-CM | POA: Diagnosis present

## 2020-03-26 DIAGNOSIS — E222 Syndrome of inappropriate secretion of antidiuretic hormone: Secondary | ICD-10-CM | POA: Diagnosis present

## 2020-03-26 DIAGNOSIS — I4430 Unspecified atrioventricular block: Secondary | ICD-10-CM | POA: Diagnosis not present

## 2020-03-26 DIAGNOSIS — B9689 Other specified bacterial agents as the cause of diseases classified elsewhere: Secondary | ICD-10-CM | POA: Diagnosis present

## 2020-03-26 DIAGNOSIS — R911 Solitary pulmonary nodule: Secondary | ICD-10-CM | POA: Diagnosis present

## 2020-03-26 DIAGNOSIS — I10 Essential (primary) hypertension: Secondary | ICD-10-CM | POA: Diagnosis not present

## 2020-03-26 DIAGNOSIS — J470 Bronchiectasis with acute lower respiratory infection: Secondary | ICD-10-CM | POA: Diagnosis present

## 2020-03-26 DIAGNOSIS — M545 Low back pain, unspecified: Secondary | ICD-10-CM | POA: Diagnosis present

## 2020-03-26 DIAGNOSIS — Z8616 Personal history of COVID-19: Secondary | ICD-10-CM

## 2020-03-26 DIAGNOSIS — R001 Bradycardia, unspecified: Secondary | ICD-10-CM | POA: Diagnosis present

## 2020-03-26 DIAGNOSIS — R9431 Abnormal electrocardiogram [ECG] [EKG]: Secondary | ICD-10-CM | POA: Diagnosis not present

## 2020-03-26 DIAGNOSIS — K861 Other chronic pancreatitis: Secondary | ICD-10-CM | POA: Diagnosis present

## 2020-03-26 DIAGNOSIS — I700 Atherosclerosis of aorta: Secondary | ICD-10-CM | POA: Diagnosis present

## 2020-03-26 DIAGNOSIS — N179 Acute kidney failure, unspecified: Secondary | ICD-10-CM | POA: Diagnosis present

## 2020-03-26 DIAGNOSIS — A310 Pulmonary mycobacterial infection: Secondary | ICD-10-CM | POA: Diagnosis present

## 2020-03-26 DIAGNOSIS — D7589 Other specified diseases of blood and blood-forming organs: Secondary | ICD-10-CM | POA: Diagnosis present

## 2020-03-26 DIAGNOSIS — R519 Headache, unspecified: Secondary | ICD-10-CM

## 2020-03-26 DIAGNOSIS — F17210 Nicotine dependence, cigarettes, uncomplicated: Secondary | ICD-10-CM | POA: Diagnosis present

## 2020-03-26 DIAGNOSIS — I471 Supraventricular tachycardia, unspecified: Secondary | ICD-10-CM

## 2020-03-26 DIAGNOSIS — J479 Bronchiectasis, uncomplicated: Secondary | ICD-10-CM | POA: Diagnosis not present

## 2020-03-26 DIAGNOSIS — Z91138 Patient's unintentional underdosing of medication regimen for other reason: Secondary | ICD-10-CM

## 2020-03-26 DIAGNOSIS — J984 Other disorders of lung: Secondary | ICD-10-CM | POA: Diagnosis not present

## 2020-03-26 DIAGNOSIS — Z8249 Family history of ischemic heart disease and other diseases of the circulatory system: Secondary | ICD-10-CM

## 2020-03-26 DIAGNOSIS — I1310 Hypertensive heart and chronic kidney disease without heart failure, with stage 1 through stage 4 chronic kidney disease, or unspecified chronic kidney disease: Secondary | ICD-10-CM | POA: Diagnosis present

## 2020-03-26 DIAGNOSIS — Z841 Family history of disorders of kidney and ureter: Secondary | ICD-10-CM

## 2020-03-26 DIAGNOSIS — T375X6A Underdosing of antiviral drugs, initial encounter: Secondary | ICD-10-CM | POA: Diagnosis present

## 2020-03-26 DIAGNOSIS — Z882 Allergy status to sulfonamides status: Secondary | ICD-10-CM

## 2020-03-26 DIAGNOSIS — F1410 Cocaine abuse, uncomplicated: Secondary | ICD-10-CM | POA: Diagnosis present

## 2020-03-26 DIAGNOSIS — Z8611 Personal history of tuberculosis: Secondary | ICD-10-CM | POA: Diagnosis not present

## 2020-03-26 DIAGNOSIS — I441 Atrioventricular block, second degree: Secondary | ICD-10-CM | POA: Diagnosis not present

## 2020-03-26 DIAGNOSIS — I4892 Unspecified atrial flutter: Secondary | ICD-10-CM | POA: Diagnosis present

## 2020-03-26 DIAGNOSIS — N1832 Chronic kidney disease, stage 3b: Secondary | ICD-10-CM | POA: Diagnosis present

## 2020-03-26 DIAGNOSIS — B451 Cerebral cryptococcosis: Secondary | ICD-10-CM | POA: Diagnosis present

## 2020-03-26 DIAGNOSIS — A312 Disseminated mycobacterium avium-intracellulare complex (DMAC): Secondary | ICD-10-CM

## 2020-03-26 DIAGNOSIS — H539 Unspecified visual disturbance: Secondary | ICD-10-CM

## 2020-03-26 DIAGNOSIS — IMO0001 Reserved for inherently not codable concepts without codable children: Secondary | ICD-10-CM

## 2020-03-26 DIAGNOSIS — E875 Hyperkalemia: Secondary | ICD-10-CM | POA: Diagnosis present

## 2020-03-26 DIAGNOSIS — R0602 Shortness of breath: Secondary | ICD-10-CM

## 2020-03-26 DIAGNOSIS — J181 Lobar pneumonia, unspecified organism: Secondary | ICD-10-CM | POA: Diagnosis not present

## 2020-03-26 DIAGNOSIS — H538 Other visual disturbances: Secondary | ICD-10-CM | POA: Diagnosis not present

## 2020-03-26 DIAGNOSIS — D75A Glucose-6-phosphate dehydrogenase (G6PD) deficiency without anemia: Secondary | ICD-10-CM | POA: Diagnosis present

## 2020-03-26 DIAGNOSIS — B370 Candidal stomatitis: Secondary | ICD-10-CM | POA: Diagnosis present

## 2020-03-26 DIAGNOSIS — Z6823 Body mass index (BMI) 23.0-23.9, adult: Secondary | ICD-10-CM

## 2020-03-26 DIAGNOSIS — Z5901 Sheltered homelessness: Secondary | ICD-10-CM

## 2020-03-26 DIAGNOSIS — E876 Hypokalemia: Secondary | ICD-10-CM | POA: Diagnosis not present

## 2020-03-26 HISTORY — DX: Human immunodeficiency virus (HIV) disease: B20

## 2020-03-26 LAB — CBC WITH DIFFERENTIAL/PLATELET
Abs Immature Granulocytes: 0.06 10*3/uL (ref 0.00–0.07)
Basophils Absolute: 0 10*3/uL (ref 0.0–0.1)
Basophils Relative: 1 %
Eosinophils Absolute: 0.2 10*3/uL (ref 0.0–0.5)
Eosinophils Relative: 4 %
HCT: 42.3 % (ref 39.0–52.0)
Hemoglobin: 13.9 g/dL (ref 13.0–17.0)
Immature Granulocytes: 2 %
Lymphocytes Relative: 17 %
Lymphs Abs: 0.6 10*3/uL — ABNORMAL LOW (ref 0.7–4.0)
MCH: 33.3 pg (ref 26.0–34.0)
MCHC: 32.9 g/dL (ref 30.0–36.0)
MCV: 101.2 fL — ABNORMAL HIGH (ref 80.0–100.0)
Monocytes Absolute: 0.7 10*3/uL (ref 0.1–1.0)
Monocytes Relative: 19 %
Neutro Abs: 2.2 10*3/uL (ref 1.7–7.7)
Neutrophils Relative %: 57 %
Platelets: 325 10*3/uL (ref 150–400)
RBC: 4.18 MIL/uL — ABNORMAL LOW (ref 4.22–5.81)
RDW: 12.1 % (ref 11.5–15.5)
WBC: 3.7 10*3/uL — ABNORMAL LOW (ref 4.0–10.5)
nRBC: 0 % (ref 0.0–0.2)

## 2020-03-26 LAB — CSF CELL COUNT WITH DIFFERENTIAL
Eosinophils, CSF: 0 % (ref 0–1)
Lymphs, CSF: 42 % (ref 40–80)
Monocyte-Macrophage-Spinal Fluid: 48 % — ABNORMAL HIGH (ref 15–45)
RBC Count, CSF: 450 /mm3 — ABNORMAL HIGH
Segmented Neutrophils-CSF: 10 % — ABNORMAL HIGH (ref 0–6)
Tube #: 3
WBC, CSF: 17 /mm3 (ref 0–5)

## 2020-03-26 LAB — COMPREHENSIVE METABOLIC PANEL
ALT: 16 U/L (ref 0–44)
AST: 27 U/L (ref 15–41)
Albumin: 2.9 g/dL — ABNORMAL LOW (ref 3.5–5.0)
Alkaline Phosphatase: 104 U/L (ref 38–126)
Anion gap: 9 (ref 5–15)
BUN: 12 mg/dL (ref 8–23)
CO2: 25 mmol/L (ref 22–32)
Calcium: 8.6 mg/dL — ABNORMAL LOW (ref 8.9–10.3)
Chloride: 101 mmol/L (ref 98–111)
Creatinine, Ser: 1.16 mg/dL (ref 0.61–1.24)
GFR, Estimated: >60 mL/min (ref >60)
Glucose, Bld: 117 mg/dL — ABNORMAL HIGH (ref 70–99)
Potassium: 4.2 mmol/L (ref 3.5–5.1)
Sodium: 135 mmol/L (ref 135–145)
Total Bilirubin: 1.1 mg/dL (ref 0.3–1.2)
Total Protein: 7.3 g/dL (ref 6.5–8.1)

## 2020-03-26 LAB — SEDIMENTATION RATE: Sed Rate: 30 mm/hr — ABNORMAL HIGH (ref 0–16)

## 2020-03-26 LAB — HIV ANTIBODY (ROUTINE TESTING W REFLEX): HIV Screen 4th Generation wRfx: REACTIVE — AB

## 2020-03-26 LAB — CRYPTOCOCCAL ANTIGEN, CSF
Crypto Ag: POSITIVE — AB
Cryptococcal Ag Titer: 2560 — AB

## 2020-03-26 LAB — PROTEIN AND GLUCOSE, CSF
Glucose, CSF: 37 mg/dL — ABNORMAL LOW (ref 40–70)
Total  Protein, CSF: 140 mg/dL — ABNORMAL HIGH (ref 15–45)

## 2020-03-26 LAB — MAGNESIUM: Magnesium: 1.8 mg/dL (ref 1.7–2.4)

## 2020-03-26 LAB — LACTIC ACID, PLASMA: Lactic Acid, Venous: 0.9 mmol/L (ref 0.5–1.9)

## 2020-03-26 LAB — PHOSPHORUS: Phosphorus: 2.9 mg/dL (ref 2.5–4.6)

## 2020-03-26 IMAGING — CT CT VENOGRAM HEAD
1 of 10 series · 6 of 47 positions shown · IV contrast (Omni 300)
Comparison: None.

CLINICAL DATA: Dural venous sinus thrombosis suspected. Headache.
On antibiotics for sinusitis.

EXAM:
CT VENOGRAM HEAD
TECHNIQUE: Contiguous axial images were obtained from the base of the skull
through the vertex during the bolus administration of intravenous
contrast using CTV timing. Multiplanar CT image reconstructions and
MIPs were obtained to evaluate the venous anatomy.
CONTRAST:  75mL OMNIPAQUE IOHEXOL 350 MG/ML SOLN

[Series 5: head with · axial · 0.42mm/px · z∈[-78,+32]mm · 6 of 77 slices shown]
[im 11/77  brain]
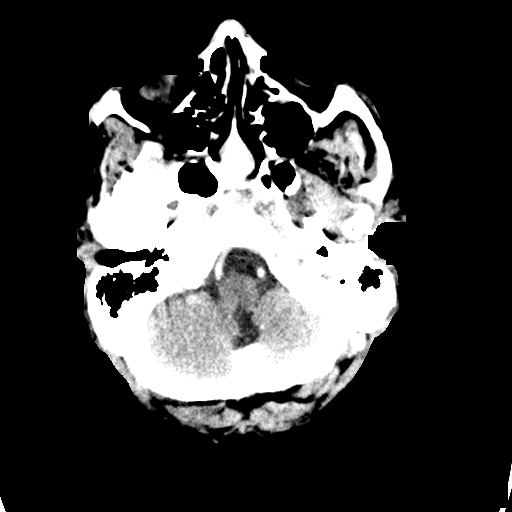
[im 22/77  bone]
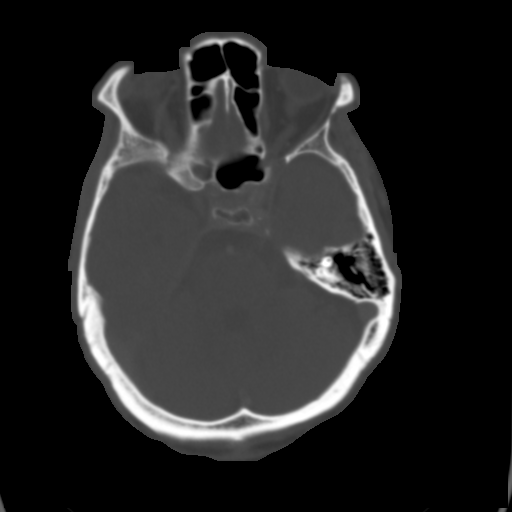
[im 33/77  brain]
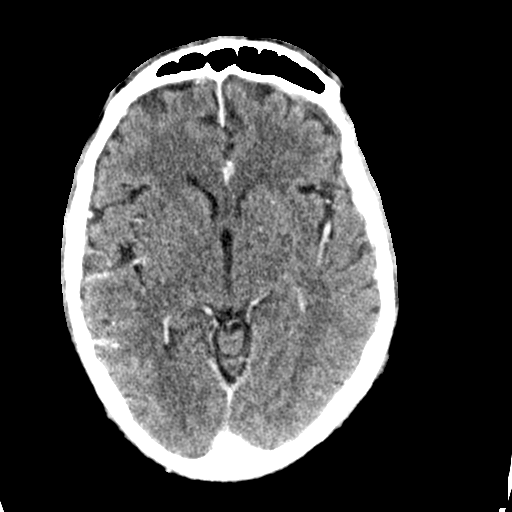
[im 44/77  bone]
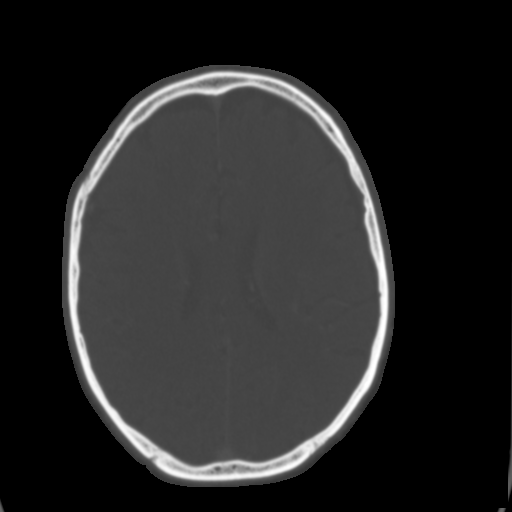
[im 55/77  brain]
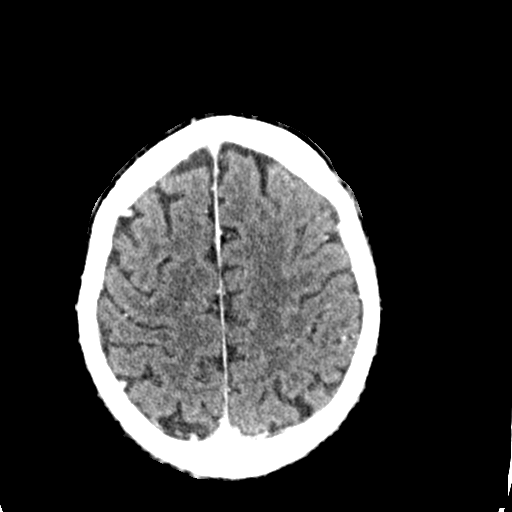
[im 66/77  bone]
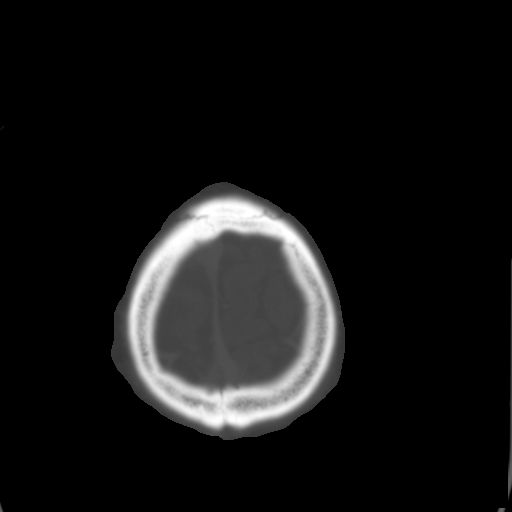

[6 of 47 positions shown; findings below may reference images not displayed]

FINDINGS: Brain: No evidence of acute large vascular territory infarct, acute
hemorrhage, hydrocephalus, extra-axial fluid collection or mass
lesion. Mild patchy white matter hypoattenuation is nonspecific but
most likely related to chronic microvascular ischemic disease.

Vascular: Calcific atherosclerosis. Major arteries are grossly
patent where visualized. Small focal hypodensity along the superior
aspect of the superior sagittal sinus, possibly extradural in
location (see series 10, image 134 and series 5, image 67).
Otherwise, dural sinuses are patent. Small left transverse sinus.

Skull: No acute fracture.

Sinuses/orbits: Scattered ethmoid air cell mucosal thickening
without air-fluid level. Unremarkable orbits.

Other: No mastoid effusions.
IMPRESSION: 1. Small focal hypodensity along the superior aspect of the superior
sagittal sinus, possibly extradural in location and not typical in
appearance/morphology for acute thrombus. Per Dr. NATTOYA an MRI
with and without contrast has already been ordered, which will
further evaluate.
2. Ethmoid air cell mucosal thickening without air-fluid levels.

Findings discussed with Dr. NATTOYA via telephone at [DATE].

## 2020-03-26 MED ORDER — SODIUM CHLORIDE 0.9 % IV BOLUS
1000.0000 mL | Freq: Once | INTRAVENOUS | Status: AC
  Administered 2020-03-26: 1000 mL via INTRAVENOUS

## 2020-03-26 MED ORDER — LABETALOL HCL 5 MG/ML IV SOLN: 10.0000 mg | INTRAVENOUS | Status: DC | PRN

## 2020-03-26 MED ORDER — MORPHINE SULFATE (PF) 4 MG/ML IV SOLN
4.0000 mg | INTRAVENOUS | Status: AC | PRN
  Administered 2020-03-26 – 2020-03-27 (×3): 4 mg via INTRAVENOUS
  Filled 2020-03-26 (×4): qty 1

## 2020-03-26 MED ORDER — HYDRALAZINE HCL 25 MG PO TABS: 25.0000 mg | ORAL_TABLET | ORAL | Status: DC | PRN

## 2020-03-26 MED ORDER — DEXTROSE 5 % IV SOLN
300.0000 mg | INTRAVENOUS | Status: DC
  Filled 2020-03-26: qty 75

## 2020-03-26 MED ORDER — DEXTROSE 5% FOR FLUSHING BEFORE AND AFTER AMBISOME
10.0000 mL | INTRAVENOUS | Status: DC
  Administered 2020-03-27 – 2020-03-28 (×3): 10 mL via INTRAVENOUS
  Filled 2020-03-26 (×4): qty 50

## 2020-03-26 MED ORDER — SODIUM CHLORIDE 0.45 % IV SOLN: INTRAVENOUS | Status: DC

## 2020-03-26 MED ORDER — LABETALOL HCL 5 MG/ML IV SOLN: 20.0000 mg | INTRAVENOUS | Status: DC | PRN

## 2020-03-26 MED ORDER — SODIUM CHLORIDE 0.9 % IV BOLUS FOR AMBISOME
500.0000 mL | INTRAVENOUS | Status: DC
  Administered 2020-03-26 – 2020-03-28 (×3): 500 mL via INTRAVENOUS

## 2020-03-26 MED ORDER — FLUCYTOSINE 500 MG PO CAPS
25.0000 mg/kg | ORAL_CAPSULE | Freq: Four times a day (QID) | ORAL | Status: DC
  Administered 2020-03-26 – 2020-04-11 (×63): 2000 mg via ORAL
  Filled 2020-03-26 (×68): qty 4

## 2020-03-26 MED ORDER — DIPHENHYDRAMINE HCL 50 MG/ML IJ SOLN: 25.0000 mg | Freq: Every day | INTRAMUSCULAR | Status: DC | PRN

## 2020-03-26 MED ORDER — MORPHINE SULFATE (PF) 4 MG/ML IV SOLN
4.0000 mg | Freq: Once | INTRAVENOUS | Status: AC
  Administered 2020-03-26: 4 mg via INTRAVENOUS
  Filled 2020-03-26: qty 1

## 2020-03-26 MED ORDER — LIDOCAINE HCL 2 % IJ SOLN
10.0000 mL | Freq: Once | INTRAMUSCULAR | Status: DC
  Filled 2020-03-26: qty 20

## 2020-03-26 MED ORDER — DEXTROSE 5% FOR FLUSHING BEFORE AND AFTER AMBISOME
10.0000 mL | INTRAVENOUS | Status: DC
  Administered 2020-03-26 – 2020-03-28 (×3): 10 mL via INTRAVENOUS
  Filled 2020-03-26 (×3): qty 50

## 2020-03-26 MED ORDER — METHYLPREDNISOLONE SODIUM SUCC 40 MG IJ SOLR
40.0000 mg | Freq: Once | INTRAMUSCULAR | Status: AC
  Administered 2020-03-26: 40 mg via INTRAVENOUS
  Filled 2020-03-26: qty 1

## 2020-03-26 MED ORDER — ACETAMINOPHEN 650 MG RE SUPP: 650.0000 mg | Freq: Four times a day (QID) | RECTAL | Status: DC | PRN

## 2020-03-26 MED ORDER — DIPHENHYDRAMINE HCL 50 MG/ML IJ SOLN
25.0000 mg | Freq: Once | INTRAMUSCULAR | Status: AC
  Administered 2020-03-26: 25 mg via INTRAVENOUS
  Filled 2020-03-26: qty 1

## 2020-03-26 MED ORDER — LIDOCAINE-EPINEPHRINE 1 %-1:100000 IJ SOLN: 10.0000 mL | Freq: Once | INTRAMUSCULAR | Status: DC

## 2020-03-26 MED ORDER — MAGNESIUM SULFATE 2 GM/50ML IV SOLN
2.0000 g | Freq: Once | INTRAVENOUS | Status: AC
  Administered 2020-03-27: 2 g via INTRAVENOUS
  Filled 2020-03-26: qty 50

## 2020-03-26 MED ORDER — ACETAMINOPHEN 325 MG PO TABS
650.0000 mg | ORAL_TABLET | Freq: Every day | ORAL | Status: DC | PRN
  Administered 2020-03-27 – 2020-04-19 (×2): 650 mg via ORAL
  Filled 2020-03-26 (×11): qty 2

## 2020-03-26 MED ORDER — GADOBUTROL 1 MMOL/ML IV SOLN
8.0000 mL | Freq: Once | INTRAVENOUS | Status: AC | PRN
  Administered 2020-03-26: 8 mL via INTRAVENOUS

## 2020-03-26 MED ORDER — ACETAMINOPHEN 325 MG PO TABS
650.0000 mg | ORAL_TABLET | Freq: Four times a day (QID) | ORAL | Status: DC | PRN
  Administered 2020-03-31 – 2020-04-18 (×16): 650 mg via ORAL
  Filled 2020-03-26 (×7): qty 2

## 2020-03-26 MED ORDER — PROCHLORPERAZINE EDISYLATE 10 MG/2ML IJ SOLN
10.0000 mg | Freq: Once | INTRAMUSCULAR | Status: AC
  Administered 2020-03-26: 10 mg via INTRAVENOUS
  Filled 2020-03-26: qty 2

## 2020-03-26 MED ORDER — SODIUM CHLORIDE 0.9 % IV BOLUS FOR AMBISOME
500.0000 mL | INTRAVENOUS | Status: DC
  Administered 2020-03-27 – 2020-03-28 (×3): 500 mL via INTRAVENOUS

## 2020-03-26 MED ORDER — OXYCODONE HCL 5 MG PO TABS
5.0000 mg | ORAL_TABLET | ORAL | Status: DC | PRN
  Administered 2020-03-26 – 2020-03-29 (×5): 5 mg via ORAL
  Filled 2020-03-26 (×5): qty 1

## 2020-03-26 MED ORDER — DEXTROSE 5 % IV SOLN
300.0000 mg | INTRAVENOUS | Status: DC
  Administered 2020-03-26 – 2020-04-10 (×16): 300 mg via INTRAVENOUS
  Filled 2020-03-26 (×22): qty 75

## 2020-03-26 MED ORDER — DEXTROSE 5 % IV SOLN: 300.0000 mg | INTRAVENOUS | Status: DC

## 2020-03-26 MED ORDER — ONDANSETRON HCL 4 MG/2ML IJ SOLN
4.0000 mg | Freq: Four times a day (QID) | INTRAMUSCULAR | Status: DC | PRN
  Administered 2020-04-13: 4 mg via INTRAVENOUS
  Filled 2020-03-26: qty 2

## 2020-03-26 MED ORDER — IOHEXOL 350 MG/ML SOLN
75.0000 mL | Freq: Once | INTRAVENOUS | Status: AC | PRN
  Administered 2020-03-26: 75 mL via INTRAVENOUS

## 2020-03-26 MED ORDER — ONDANSETRON HCL 4 MG PO TABS: 4.0000 mg | ORAL_TABLET | Freq: Four times a day (QID) | ORAL | Status: DC | PRN

## 2020-03-26 MED ORDER — DIPHENHYDRAMINE HCL 25 MG PO CAPS
25.0000 mg | ORAL_CAPSULE | Freq: Every day | ORAL | Status: DC | PRN
  Administered 2020-04-05 – 2020-04-12 (×4): 25 mg via ORAL
  Filled 2020-03-26 (×4): qty 1

## 2020-03-26 MED ORDER — IPRATROPIUM-ALBUTEROL 0.5-2.5 (3) MG/3ML IN SOLN
3.0000 mL | Freq: Once | RESPIRATORY_TRACT | Status: AC
  Administered 2020-03-26: 3 mL via RESPIRATORY_TRACT

## 2020-03-26 NOTE — ED Provider Notes (Signed)
[facility] EMERGENCY DEPARTMENT Provider Note   CSN: [id number] Arrival date & time: 03/26/20  2683     History Chief Complaint  Patient presents with  . Headache    Ricky [name] is a 65 y.o. male.  The history is provided by the patient and medical records. No language interpreter was used.  Headache Pain location:  R temporal and frontal Quality:  Dull and sharp Severity currently:  10/10 Severity at highest:  10/10 Onset quality:  Gradual Duration:  1 week Timing:  Constant Progression:  Worsening Chronicity:  New Similar to prior headaches: no   Relieved by:  Nothing Worsened by:  Light and sound Associated symptoms: dizziness, facial pain, photophobia, sinus pressure and visual change   Associated symptoms: no abdominal pain, no back pain, no congestion, no cough, no diarrhea, no fatigue, no fever, no focal weakness, no loss of balance, no myalgias, no nausea, no near-syncope, no neck pain, no neck stiffness, no numbness, no paresthesias, no seizures, no syncope, no vomiting and no weakness        History reviewed. No pertinent past medical history.  There are no problems to display for this patient.   History reviewed. No pertinent surgical history.     No family history on file.     Home Medications Prior to Admission medications   Medication Sig Start Date End Date Taking? Authorizing Provider  amoxicillin-clavulanate (AUGMENTIN) 875-125 MG tablet Take 1 tablet by mouth 2 (two) times daily. One po bid x 7 days 03/21/20   Domenic Moras, PA-C  benzonatate (TESSALON) 100 MG capsule Take 1 capsule (100 mg total) by mouth every 8 (eight) hours. 03/21/20   Domenic Moras, PA-C  nystatin (MYCOSTATIN) 100000 UNIT/ML suspension Take 5 mLs (500,000 Units total) by mouth 4 (four) times daily. 03/03/20   Blanchie Dessert, MD    Allergies    Patient has no known allergies.  Review of Systems   Review of Systems  Constitutional: Negative for  chills, fatigue and fever.  HENT: Positive for sinus pressure. Negative for congestion.   Eyes: Positive for photophobia and visual disturbance.  Respiratory: Negative for cough, chest tightness, shortness of breath and wheezing.   Cardiovascular: Negative for chest pain, palpitations, leg swelling, syncope and near-syncope.  Gastrointestinal: Negative for abdominal pain, constipation, diarrhea, nausea and vomiting.  Genitourinary: Negative for dysuria and flank pain.  Musculoskeletal: Negative for back pain, myalgias, neck pain and neck stiffness.  Skin: Negative for rash and wound.  Neurological: Positive for dizziness, light-headedness and headaches. Negative for focal weakness, seizures, facial asymmetry, speech difficulty, weakness, numbness, paresthesias and loss of balance.  Psychiatric/Behavioral: Negative for agitation and confusion.  All other systems reviewed and are negative.   Physical Exam Updated Vital Signs BP (!) 142/95 (BP Location: Right Arm)   Pulse 77   Temp 97.9 F (36.6 C)   Resp 16   SpO2 97%   Physical Exam Vitals and nursing note reviewed.  Constitutional:      General: He is not in acute distress.    Appearance: He is well-developed and well-nourished. He is not ill-appearing, toxic-appearing or diaphoretic.  HENT:     Head: Normocephalic and atraumatic.     Mouth/Throat:     Mouth: Mucous membranes are moist.  Eyes:     General: No visual field deficit.    Extraocular Movements: Extraocular movements intact.     Right eye: Normal extraocular motion and no nystagmus.     Left eye:  Normal extraocular motion and no nystagmus.     Conjunctiva/sclera: Conjunctivae normal.     Pupils: Pupils are equal, round, and reactive to light. Pupils are equal.     Right eye: Pupil is round and reactive.     Left eye: Pupil is round and reactive.  Cardiovascular:     Rate and Rhythm: Normal rate and regular rhythm.     Heart sounds: No murmur  heard.   Pulmonary:     Effort: Pulmonary effort is normal. No respiratory distress.     Breath sounds: Normal breath sounds. No wheezing, rhonchi or rales.  Chest:     Chest wall: No tenderness.  Abdominal:     Palpations: Abdomen is soft.     Tenderness: There is no abdominal tenderness.  Musculoskeletal:        General: No edema.     Cervical back: Neck supple.  Skin:    General: Skin is warm and dry.     Capillary Refill: Capillary refill takes less than 2 seconds.  Neurological:     Mental Status: He is alert.     GCS: GCS eye subscore is 4. GCS verbal subscore is 5. GCS motor subscore is 6.     Cranial Nerves: No cranial nerve deficit, dysarthria or facial asymmetry.     Motor: No weakness, abnormal muscle tone or seizure activity.     Coordination: Coordination normal. Finger-Nose-Finger Test normal.     Comments: No focal neurologic deficits on initial exam.  Normal finger-nose-finger testing, sensation, strength in extremities.  Symmetric smile.  Pupils are symmetric and active normal extraocular movements.  No nystagmus.  Tenderness in the right temporal area and right forehead.  Clear speech.  Psychiatric:        Mood and Affect: Mood and affect and mood normal.     ED Results / Procedures / Treatments   Labs (all labs ordered are listed, but only abnormal results are displayed) Labs Reviewed  CBC WITH DIFFERENTIAL/PLATELET - Abnormal; Notable for the following components:      Result Value   WBC 3.7 (*)    RBC 4.18 (*)    MCV 101.2 (*)    Lymphs Abs 0.6 (*)    All other components within normal limits  COMPREHENSIVE METABOLIC PANEL - Abnormal; Notable for the following components:   Glucose, Bld 117 (*)    Calcium 8.6 (*)    Albumin 2.9 (*)    All other components within normal limits  SEDIMENTATION RATE - Abnormal; Notable for the following components:   Sed Rate 30 (*)    All other components within normal limits  HIV ANTIBODY (ROUTINE TESTING W REFLEX) -  Abnormal; Notable for the following components:   HIV Screen 4th Generation wRfx Reactive (*)    All other components within normal limits  CSF CULTURE W GRAM STAIN  FUNGUS CULTURE WITH STAIN  LACTIC ACID, PLASMA  LACTIC ACID, PLASMA  HIV-1/2 AB - DIFFERENTIATION  CSF CELL COUNT WITH DIFFERENTIAL  PROTEIN AND GLUCOSE, CSF  CRYPTOCOCCAL ANTIGEN, CSF  VDRL, CSF  HERPES SIMPLEX VIRUS(HSV) DNA BY PCR    EKG None  Radiology CT VENOGRAM HEAD  Result Date: 03/26/2020 CLINICAL DATA:  Dural venous sinus thrombosis suspected. Headache. On antibiotics for sinusitis. EXAM: CT VENOGRAM HEAD TECHNIQUE: Contiguous axial images were obtained from the base of the skull through the vertex during the bolus administration of intravenous contrast using CTV timing. Multiplanar CT image reconstructions and MIPs were obtained to evaluate  the venous anatomy. CONTRAST:  55m OMNIPAQUE IOHEXOL 350 MG/ML SOLN COMPARISON:  None. FINDINGS: Brain: No evidence of acute large vascular territory infarct, acute hemorrhage, hydrocephalus, extra-axial fluid collection or mass lesion. Mild patchy white matter hypoattenuation is nonspecific but most likely related to chronic microvascular ischemic disease. Vascular: Calcific atherosclerosis. Major arteries are grossly patent where visualized. Small focal hypodensity along the superior aspect of the superior sagittal sinus, possibly extradural in location (see series 10, image 134 and series 5, image 67). Otherwise, dural sinuses are patent. Small left transverse sinus. Skull: No acute fracture. Sinuses/orbits: Scattered ethmoid air cell mucosal thickening without air-fluid level. Unremarkable orbits. Other: No mastoid effusions. IMPRESSION: 1. Small focal hypodensity along the superior aspect of the superior sagittal sinus, possibly extradural in location and not typical in appearance/morphology for acute thrombus. Per Dr. TSherry Ruffingan MRI with and without contrast has already been  ordered, which will further evaluate. 2. Ethmoid air cell mucosal thickening without air-fluid levels. Findings discussed with Dr. TSherry Ruffingvia telephone at 12:36 PM. Electronically Signed   By: FMargaretha SheffieldMD   On: 03/26/2020 12:42    Procedures Procedures   Medications Ordered in ED Medications  lidocaine-EPINEPHrine (XYLOCAINE W/EPI) 1 %-1:100000 (with pres) injection 10 mL (has no administration in time range)  sodium chloride 0.9 % bolus 1,000 mL (1,000 mLs Intravenous New Bag/Given 03/26/20 1015)  prochlorperazine (COMPAZINE) injection 10 mg (10 mg Intravenous Given 03/26/20 1016)  diphenhydrAMINE (BENADRYL) injection 25 mg (25 mg Intravenous Given 03/26/20 1016)  iohexol (OMNIPAQUE) 350 MG/ML injection 75 mL (75 mLs Intravenous Contrast Given 03/26/20 1143)  gadobutrol (GADAVIST) 1 MMOL/ML injection 8 mL (8 mLs Intravenous Contrast Given 03/26/20 1418)  morphine 4 MG/ML injection 4 mg (4 mg Intravenous Given 03/26/20 1459)    ED Course  I have reviewed the triage vital signs and the nursing notes.  Pertinent labs & imaging results that were available during my care of the patient were reviewed by me and considered in my medical decision making (see chart for details).    MDM Rules/Calculators/A&P                          Ricky WILSEYis a 65y.o. male with no significant past medical history aside from COVID-19 infection last month and recent diagnosis of sinus infection 6 days ago who presents with continued and worsening right-sided headache with now intermittent dizziness and diplopia.  Patient reports that his headache is continued and worsened and is now 10 out of 10 in severity.  He reports he does have some photosensitivity and phonophobia but he reports he does not have history of migraines.  He reports that he is very tender to the touch on his right temple area and has significant headache on the right frontal area.  He denies nausea or vomiting and also denies any new  fevers, chills, ejection, cough, urinary symptoms or GI symptoms.  Denies neck pain or neck stiffness.  Denies recent trauma.  He reports the headache is sharp and aching.  On exam, lungs clear and chest nontender.  Abdomen nontender.  Patient had normal grip strength sensation and pulses in extremities.  Had normal finger-nose-finger testing.  Normal sensation in lower extremities and strength in legs.  Patient had symmetric smile.  He did have some tenderness in the right temporal area, pupils are symmetric and reactive with normal extraocular movements.  Patient had some evidence of thrush on his palate.  Exam otherwise unremarkable.  Given patient's recent diagnosis of Covid, I am concerned that several different etiologies of his discomfort.  We discussed this could still be sinusitis that is not improving with his current antibiotics however with his new neurologic deficits developing, I do feel is reasonable to get some more advanced imaging.  We will get a CTV to look for a venous sinus thrombosis as well as an MRI with and without contrast to look for bleeding, stroke, infection, or other acute abnormalities.  We will give a headache cocktail try to help with his discomfort.  We will get ESR to help rule out giant cell arteritis.  This was felt less likely given his age however if it is significant elevated, will consider further management in this regard.  Low suspicion for meningitis given lack of neck pain or neck stiffness.  Will order HIV test due to the thrush seen in the back of his mouth on exam.  Anticipate reassessment after work-up.  Patient's CT scan showed some abnormality but was not convincing for venous sinus thrombosis.  MRI was also completed and did not show stroke or mass.  There was some artifact we discussed that patient will likely need follow-up CTV in the future to further assess in 1 week.  While awaiting for reassessment, patient's labs also returned showing that he  does have HIV.  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  He reports he was on medications for years but then stopped a little over a year ago because he was "sick of taking them.  He is unsure about his viral load or CD4 count but he did think it was between 500 and 400 at some point in the past.  With the information that he actually has HIV with significant and severe headache and intermittent neurologic deficits with diplopia and dizziness, we do feel that he needs lumbar puncture to further evaluate.  Patient's care will be transferred to Dr. Ayesha Rumpf to help facilitate and complete the lumbar puncture as well as reassessment.  Anticipate touching base with neurology if LP findings are concerning and will disposition per reassessment and completion of work-up.    Final Clinical Impression(s) / ED Diagnoses Final diagnoses:  Acute nonintractable headache, unspecified headache type  Transient vision disturbance     Clinical Impression: 1. Acute nonintractable headache, unspecified headache type   2. Transient vision disturbance     Disposition: Care transferred to oncoming team while awaiting results of lumbar puncture and reassessment.  Anticipate disposition based on these results  This note was prepared with assistance of Dragon voice recognition software. Occasional wrong-word or sound-a-like substitutions may have occurred due to the inherent limitations of voice recognition software.     Savva Beamer, Gwenyth Allegra, MD 03/26/20 [phone]

## 2020-03-26 NOTE — ED Triage Notes (Signed)
Pt arrives to ED with chief complaint of headache.   He was seen here on 2/16 with headache and treated for sinusitis at that time with antibiotic which he is still taking. He has been using tylenol for this headache and states it is unchanged, He point to right frontal side of head and states this has been a constant pain.  Recent covid less than 1 month ago with covid pna.

## 2020-03-26 NOTE — ED Notes (Signed)
PT in MRI.

## 2020-03-26 NOTE — ED Notes (Signed)
MD paged by Network engineer. Patient hypertensive with HR 58-60, labetalol not given r/t same.

## 2020-03-26 NOTE — ED Notes (Signed)
Dr. Olevia Bowens made aware of BP and HR at bedside.

## 2020-03-26 NOTE — H&P (Addendum)
History and Physical    Ricky [name]:[id number] DOB: 07-07-1955 DOA: 03/26/2020  PCP: Pcp, No  Patient coming from: Home.  I have personally briefly reviewed patient's old medical records in [facility]  Chief Complaint: Headache.  HPI: Ricky [name] is a 65 y.o. male with medical history significant of HIV who is coming to the emergency department with complaints of progressively worse headache associated with dizziness, photophobia, sinus pressure and blurry vision for the past 2 weeks.  He denies focal weakness or tingling sensation.  No nuchal rigidity.  No fever, chills, sore throat, rhinorrhea, dyspnea, wheezing or hemoptysis.  No chest pain, palpitations, diaphoresis, PND, orthopnea or pitting edema of the lower extremities.  Denies abdominal pain, nausea, vomiting, diarrhea, constipation, melena hematochezia.  Denies dysuria, frequency or hematuria.  Denies polyuria, believes Acomplia failure blurred vision.  ED Course: Initial vital signs were temperature 97.9 F, pulse 77, pulse 16, BP 142/95 mmHg O2 sat 97% on room air.  The patient received analgesics, 2000 mL of NS bolus, amphotericin B and flucytosine per pharmacy.  Dr. Juleen China from [facility] was contacted and notified the neuro hospitalist service.  Labwork: CBC showed a white count of 3.7, hemoglobin 13.9 g/dL and platelets 325.  Set rate was 30 mm/h.  Lactic acid was normal.  CMP shows an albumin level of 2.9 g/dL, glucose of 117 and calcium of 8.6 mg/dL.  All other values were normal.  HIV screen was positive.  The patient underwent an LP which had results consistent with cryptococcal meningitis.  Imaging: CT venogram shows small focal hypodensity along the superior aspect of the sagittal sinus, possibly extradural in location and not typical in appearance/morphology for acute thrombus.  MRI could not characterize further due to artifact.  Findings not highly suspicious for acute thrombus.  CTV follow-up in about a week  recommended by radiology.  Please see images and full radiology report for further detail.  Review of Systems: As per HPI otherwise all other systems reviewed and are negative.  Past Medical History:  Diagnosis Date  . Human immunodeficiency virus (HIV) disease ([place]) 03/26/2020    History reviewed. No pertinent surgical history.  Social History  reports that he has been smoking cigarettes. He does not have any smokeless tobacco history on file. He reports current alcohol use of about 14.0 - 21.0 standard drinks of alcohol per week. He reports previous drug use.  No Known Allergies  Family History  Problem Relation Age of Onset  . Hypertension Mother   . Kidney disease Mother   . Hypertension Sister   . Hypertension Brother    Prior to Admission medications   Medication Sig Start Date End Date Taking? Authorizing Provider  amoxicillin-clavulanate (AUGMENTIN) 875-125 MG tablet Take 1 tablet by mouth 2 (two) times daily. One po bid x 7 days 03/21/20  Yes Domenic Moras, PA-C  benzonatate (TESSALON) 100 MG capsule Take 1 capsule (100 mg total) by mouth every 8 (eight) hours. 03/21/20  Yes Domenic Moras, PA-C    Physical Exam: Vitals:   03/26/20 1839 03/26/20 2000 03/26/20 2136 03/26/20 2139  BP: (!) 205/108 (!) 198/105 (!) 155/93   Pulse: 68 60 66   Resp: 20 20    Temp:   99.9 F (37.7 C)   TempSrc:   Oral   SpO2: 99% 99% 98% 98%  Weight: 80 kg       Constitutional: NAD, calm, comfortable Eyes: PERRL, lids and conjunctivae mildly injected. ENMT: Mucous membranes are moist.  Posterior pharynx shows oral thrush Neck: normal, supple, no masses, no thyromegaly Respiratory: Decreased breath sounds on bases with mild bilateral wheezing, no rhonchi or crackles. Normal respiratory effort. No accessory muscle use.  Cardiovascular: Regular rate and rhythm, no murmurs / rubs / gallops. No extremity edema. 2+ pedal pulses. No carotid bruits.  Abdomen: No distention.  Bowel sounds positive.   Soft, no tenderness, no masses palpated. No hepatosplenomegaly.  Musculoskeletal: no clubbing / cyanosis. Good ROM, no contractures. Normal muscle tone.  Skin: no rashes, lesions, ulcers on very limited otological examination. Neurologic: CN 2-12 grossly intact. Sensation intact, DTR normal. Strength 5/5 in all 4.  Psychiatric: Normal judgment and insight. Alert and oriented x 3. Normal mood.   Labs on Admission: I have personally reviewed following labs and imaging studies  CBC: Recent Labs  Lab 03/26/20 0958  WBC 3.7*  NEUTROABS 2.2  HGB 13.9  HCT 42.3  MCV 101.2*  PLT 865    Basic Metabolic Panel: Recent Labs  Lab 03/26/20 0958 03/26/20 1829  NA 135  --   K 4.2  --   CL 101  --   CO2 25  --   GLUCOSE 117*  --   BUN 12  --   CREATININE 1.16  --   CALCIUM 8.6*  --   MG  --  1.8  PHOS  --  2.9    GFR: CrCl cannot be calculated (Unknown ideal weight.).  Liver Function Tests: Recent Labs  Lab 03/26/20 0958  AST 27  ALT 16  ALKPHOS 104  BILITOT 1.1  PROT 7.3  ALBUMIN 2.9*    Urine analysis: No results found for: COLORURINE, APPEARANCEUR, LABSPEC, PHURINE, GLUCOSEU, HGBUR, BILIRUBINUR, KETONESUR, PROTEINUR, UROBILINOGEN, NITRITE, LEUKOCYTESUR  Radiological Exams on Admission: MR Brain W and Wo Contrast  Result Date: 03/26/2020 CLINICAL DATA:  Neuro deficit, acute stroke suspected. Vision changes, headache. EXAM: MRI HEAD WITHOUT AND WITH CONTRAST TECHNIQUE: Multiplanar, multiecho pulse sequences of the brain and surrounding structures were obtained without and with intravenous contrast. CONTRAST:  66mL GADAVIST GADOBUTROL 1 MMOL/ML IV SOLN COMPARISON:  Same day CTV. FINDINGS: Motion limited study. Brain: No acute infarction, hemorrhage, hydrocephalus, extra-axial collection or mass lesion. Vascular: Major arterial flow voids are maintained at the skull base. The area of abnormality seen on same-day CTV is poorly characterized on this study secondary to motion and  flow related artifacts. Skull and upper cervical spine: Normal marrow signal. Sinuses/Orbits: Mild ethmoid air cell mucosal thickening without air-fluid levels. Unremarkable orbits. Other: No mastoid effusions. IMPRESSION: 1. No evidence of acute nonvascular abnormality. 2. The area of abnormality seen on same-day CTV is poorly characterized on this study secondary to motion and flow related artifact. While the appearance on the CTV was atypical in appearance/morphology for acute thrombus, a follow up CTV in approximately one week could evaluate for change if the patient's symptoms do not improve or worsen. Electronically Signed   By: Margaretha Sheffield MD   On: 03/26/2020 14:48   CT VENOGRAM HEAD  Result Date: 03/26/2020 CLINICAL DATA:  Dural venous sinus thrombosis suspected. Headache. On antibiotics for sinusitis. EXAM: CT VENOGRAM HEAD TECHNIQUE: Contiguous axial images were obtained from the base of the skull through the vertex during the bolus administration of intravenous contrast using CTV timing. Multiplanar CT image reconstructions and MIPs were obtained to evaluate the venous anatomy. CONTRAST:  51mL OMNIPAQUE IOHEXOL 350 MG/ML SOLN COMPARISON:  None. FINDINGS: Brain: No evidence of acute large vascular territory infarct, acute hemorrhage, hydrocephalus,  extra-axial fluid collection or mass lesion. Mild patchy white matter hypoattenuation is nonspecific but most likely related to chronic microvascular ischemic disease. Vascular: Calcific atherosclerosis. Major arteries are grossly patent where visualized. Small focal hypodensity along the superior aspect of the superior sagittal sinus, possibly extradural in location (see series 10, image 134 and series 5, image 67). Otherwise, dural sinuses are patent. Small left transverse sinus. Skull: No acute fracture. Sinuses/orbits: Scattered ethmoid air cell mucosal thickening without air-fluid level. Unremarkable orbits. Other: No mastoid effusions.  IMPRESSION: 1. Small focal hypodensity along the superior aspect of the superior sagittal sinus, possibly extradural in location and not typical in appearance/morphology for acute thrombus. Per Dr. Sherry Ruffing an MRI with and without contrast has already been ordered, which will further evaluate. 2. Ethmoid air cell mucosal thickening without air-fluid levels. Findings discussed with Dr. Sherry Ruffing via telephone at 12:36 PM. Electronically Signed   By: Margaretha Sheffield MD   On: 03/26/2020 12:42    EKG: Independently reviewed.  Vent. 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 Continue IV fluids. Continue amphotericin B per pharmacy. Continue flucytosine per pharmacy dosing. Analgesics as needed. Consult ID in a.m. Neurology is aware of the patient.  Active Problems:   Atrial flutter CHA?DS?-VASc Score of at least 2. RVR resolved with 5 mg of metoprolol IVP. Diltiazem orders on standby if your recurs. Cardiology will be consulted by [facility] cross coverage. We will defer anticoagulation to then. Check echocardiogram.    Hypertension Continue as needed hydralazine. Monitor blood pressure and heart rate.    Human immunodeficiency virus (HIV) disease ([place]) Infectious diseases aware. HAART puts him at risk for IRIS.    Leukocytopenia In the setting of HIV disease. Monitor WBC.    Macrocytosis Check K53 and folic acid level.    Moderate protein malnutrition (HCC)   Hypoalbuminemia Consult nutritional services. Protein supplementation. Resume HIV treatment.    Aortic atherosclerosis ([place]) Will need statin therapy.   DVT prophylaxis: Lovenox SQ. Code Status:   Full code. Family Communication: Disposition Plan:   Patient is  from:  Home.  Anticipated DC to:  Home.  Anticipated DC date:  TBD.  Anticipated DC barriers: Clinical condition.  Consults called:  Dr. Juleen China (IV) and Dr. Curly [place] (neurology). Admission status:  Inpatient/progressive unit.   Severity of Illness:Very high due to cryptococcal meningitis after the patient has to discontinued antiretroviral therapy for the past year.  The patient will need to remain in the hospital for several days to receive IV antifungals and close monitoring.  Reubin Milan MD [facility]  How to contact the [facility] Attending or Consulting provider [place] or covering provider during after hours [place], for this patient?   1. Check the care team in [facility] and look for a) attending/consulting [facility] provider listed and b) the [facility] team listed 2. Log into [email or web address] and use [place]'s universal password to access. If you do not have the password, please contact the hospital operator. 3. Locate the [facility] provider you are looking for under [facility] and page to a number that you can be directly reached. 4. If you still have difficulty reaching the provider, please page the [facility] (Director on Call) for the Hospitalists listed on amion for assistance.  03/26/2020, 10:27 PM  This document was prepared using Dragon voice recognition software and may contain some unintended transcription errors.

## 2020-03-26 NOTE — ED Notes (Signed)
Lab called with critical  titer- 2560 Cryptococcal- positive

## 2020-03-26 NOTE — Progress Notes (Signed)
Pharmacy Antibiotic Note  Ricky [name] is a 65 y.o. male admitted on 03/26/2020 with meningitis.  Pharmacy has been consulted for Amphotericin B and Flucytosine dosing.  Weight: 80 kg (176 lb 4.8 oz)  Temp (24hrs), Avg:98.9 F (37.2 C), Min:97.9 F (36.6 C), Max:99.8 F (37.7 C)  Recent Labs  Lab 03/26/20 0958  WBC 3.7*  CREATININE 1.16  LATICACIDVEN 0.9    CrCl cannot be calculated (Unknown ideal weight.).    No Known Allergies  Antimicrobials this admission: 2/21 Amphotericin B >>  2/21 Flucytosine >>   Dose adjustments this admission:   Microbiology results: Spinal Cx >> Cryptococcoma  Plan:  - Amphotericin B 300mg  (3.75mg /kg) IV q24h  - Pre and post treatment orders also placed - Flucytosine 2000mg  PO q6h  - Monitor patients renal function, lytes, and liver function    Thank you for allowing pharmacy to be a part of this patient's care.  Duanne Limerick 03/26/2020 6:59 PM

## 2020-03-26 NOTE — ED Provider Notes (Signed)
Patient care assumed at 1500.  Pt with hx HIV here for evaluation of progressive HA.  Plan to obtain LP to further evaluate HA when private room available.    .Lumbar Puncture  Date/Time: 03/26/2020 5:06 PM Performed by: Quintella Reichert, MD Authorized by: Quintella Reichert, MD   Consent:    Consent obtained:  Verbal   Consent given by:  Patient   Risks, benefits, and alternatives were discussed: yes     Risks discussed:  Bleeding, nerve damage, infection, headache, repeat procedure and pain Universal protocol:    Patient identity confirmed:  Verbally with patient Pre-procedure details:    Procedure purpose:  Diagnostic Anesthesia:    Anesthesia method:  Local infiltration   Local anesthetic:  Lidocaine 1% w/o epi Procedure details:    Lumbar space:  L4-L5 interspace   Patient position:  L lateral decubitus   Needle gauge:  20   Needle length (in):  3.5   Ultrasound guidance: no     Number of attempts:  2   Opening pressure (cm H2O):  27   Closing pressure (cm H2O):  21   Fluid appearance:  Clear (21)   Tubes of fluid:  4   Total volume (ml):  6 Post-procedure details:    Puncture site:  Adhesive bandage applied   Procedure completion:  Tolerated well, no immediate complications    CSF is consistent with crypto cockle meningitis. Discussed with Dr. Juleen China with infectious disease, who recommends starting amphotericin B as well as flucytosine and ID will follow the patient in the hospital. Patient updated findings of studies and recommendation for admission and he is in agreement with treatment plan. Hospitalist consulted for admission.  CRITICAL CARE Performed by: Quintella Reichert   Total critical care time: 35 minutes  Critical care time was exclusive of separately billable procedures and treating other patients.  Critical care was necessary to treat or prevent imminent or life-threatening deterioration.  Critical care was time spent personally by me on the following  activities: development of treatment plan with patient and/or surrogate as well as nursing, discussions with consultants, evaluation of patient's response to treatment, examination of patient, obtaining history from patient or surrogate, ordering and performing treatments and interventions, ordering and review of laboratory studies, ordering and review of radiographic studies, pulse oximetry and re-evaluation of patient's condition.     Quintella Reichert, MD 03/27/20 [phone]

## 2020-03-26 NOTE — ED Notes (Signed)
Critical wbc spinal fluid Result- 17 Dr. Ralene Bathe notified.

## 2020-03-26 NOTE — ED Notes (Signed)
Attempted to call report with no answer on unit

## 2020-03-27 ENCOUNTER — Inpatient Hospital Stay (HOSPITAL_COMMUNITY): Payer: Medicaid Other

## 2020-03-27 DIAGNOSIS — E8809 Other disorders of plasma-protein metabolism, not elsewhere classified: Secondary | ICD-10-CM | POA: Diagnosis not present

## 2020-03-27 DIAGNOSIS — I10 Essential (primary) hypertension: Secondary | ICD-10-CM | POA: Diagnosis not present

## 2020-03-27 DIAGNOSIS — I4892 Unspecified atrial flutter: Secondary | ICD-10-CM | POA: Insufficient documentation

## 2020-03-27 DIAGNOSIS — B451 Cerebral cryptococcosis: Secondary | ICD-10-CM

## 2020-03-27 DIAGNOSIS — I700 Atherosclerosis of aorta: Secondary | ICD-10-CM | POA: Diagnosis present

## 2020-03-27 DIAGNOSIS — B20 Human immunodeficiency virus [HIV] disease: Secondary | ICD-10-CM | POA: Diagnosis not present

## 2020-03-27 DIAGNOSIS — J181 Lobar pneumonia, unspecified organism: Secondary | ICD-10-CM

## 2020-03-27 LAB — CBC
HCT: 42.4 % (ref 39.0–52.0)
Hemoglobin: 14.3 g/dL (ref 13.0–17.0)
MCH: 33.6 pg (ref 26.0–34.0)
MCHC: 33.7 g/dL (ref 30.0–36.0)
MCV: 99.8 fL (ref 80.0–100.0)
Platelets: 324 10*3/uL (ref 150–400)
RBC: 4.25 MIL/uL (ref 4.22–5.81)
RDW: 11.8 % (ref 11.5–15.5)
WBC: 5.4 10*3/uL (ref 4.0–10.5)
nRBC: 0 % (ref 0.0–0.2)

## 2020-03-27 LAB — EXPECTORATED SPUTUM ASSESSMENT W GRAM STAIN, RFLX TO RESP C

## 2020-03-27 LAB — ECHOCARDIOGRAM COMPLETE
Area-P 1/2: 3.72 cm2
Height: 73 in
S' Lateral: 3.8 cm
Weight: 2850.11 oz

## 2020-03-27 LAB — CD4/CD8 (T-HELPER/T-SUPPRESSOR CELL)
CD4 absolute: <35 /uL — ABNORMAL LOW (ref 400–1790)
CD4%: 2 % — ABNORMAL LOW (ref 33–65)
CD8 T Cell Abs: 482 /uL (ref 190–1000)
CD8tox: 56 % — ABNORMAL HIGH (ref 12–40)
Total lymphocyte count: 857 /uL — ABNORMAL LOW (ref 1000–4000)

## 2020-03-27 LAB — COMPREHENSIVE METABOLIC PANEL
ALT: 18 U/L (ref 0–44)
AST: 30 U/L (ref 15–41)
Albumin: 3 g/dL — ABNORMAL LOW (ref 3.5–5.0)
Alkaline Phosphatase: 95 U/L (ref 38–126)
Anion gap: 13 (ref 5–15)
BUN: 10 mg/dL (ref 8–23)
CO2: 20 mmol/L — ABNORMAL LOW (ref 22–32)
Calcium: 8.6 mg/dL — ABNORMAL LOW (ref 8.9–10.3)
Chloride: 98 mmol/L (ref 98–111)
Creatinine, Ser: 1.09 mg/dL (ref 0.61–1.24)
GFR, Estimated: >60 mL/min (ref >60)
Glucose, Bld: 234 mg/dL — ABNORMAL HIGH (ref 70–99)
Potassium: 4 mmol/L (ref 3.5–5.1)
Sodium: 131 mmol/L — ABNORMAL LOW (ref 135–145)
Total Bilirubin: 1 mg/dL (ref 0.3–1.2)
Total Protein: 7.6 g/dL (ref 6.5–8.1)

## 2020-03-27 LAB — MAGNESIUM: Magnesium: 2.1 mg/dL (ref 1.7–2.4)

## 2020-03-27 LAB — HIV-1/2 AB - DIFFERENTIATION
HIV 1 Ab: REACTIVE — AB
HIV 2 Ab: NONREACTIVE

## 2020-03-27 LAB — TSH: TSH: 0.628 u[IU]/mL (ref 0.350–4.500)

## 2020-03-27 LAB — T4, FREE: Free T4: 0.75 ng/dL (ref 0.61–1.12)

## 2020-03-27 IMAGING — CT CT CHEST W/ CM
2 of 4 series · 15 of 36 positions shown, 18 images · IV contrast (APPLIED)
Comparison: None.

CLINICAL DATA: Persistent cough.

EXAM:
CT CHEST WITH CONTRAST
TECHNIQUE: Multidetector CT imaging of the chest was performed during
intravenous contrast administration.
CONTRAST:  75mL OMNIPAQUE IOHEXOL 300 MG/ML  SOLN

[Series 3: chest w · axial · 0.78mm/px · z∈[+1163,+1433]mm · 12 of 161 slices shown, 15 images]
[im 13/161  mediastinal]
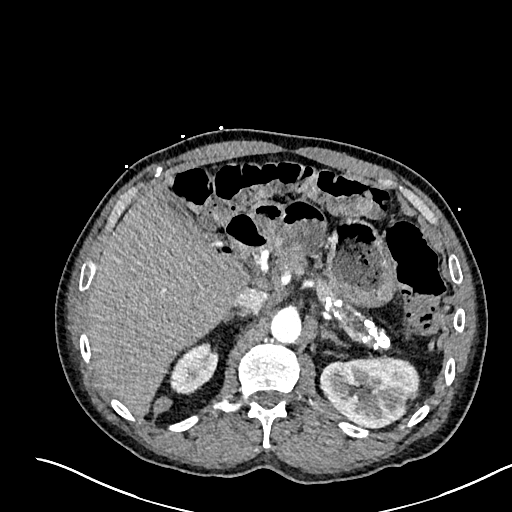
[im 13/161  lung]
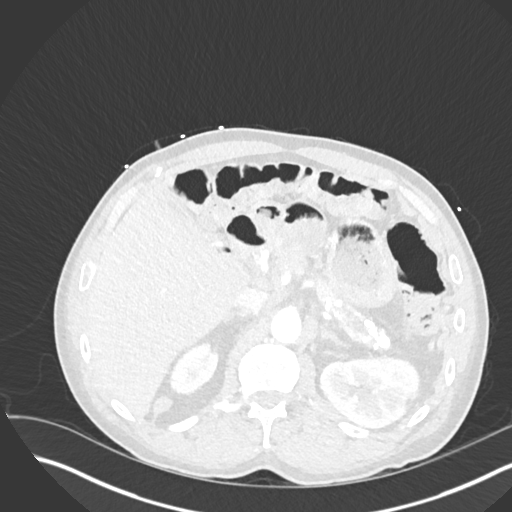
[im 25/161  lung]
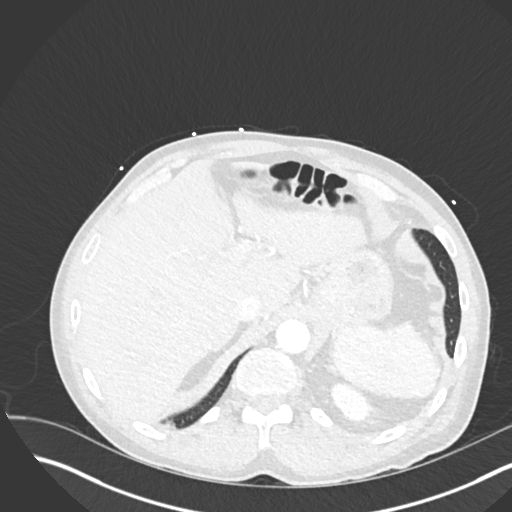
[im 37/161  lung]
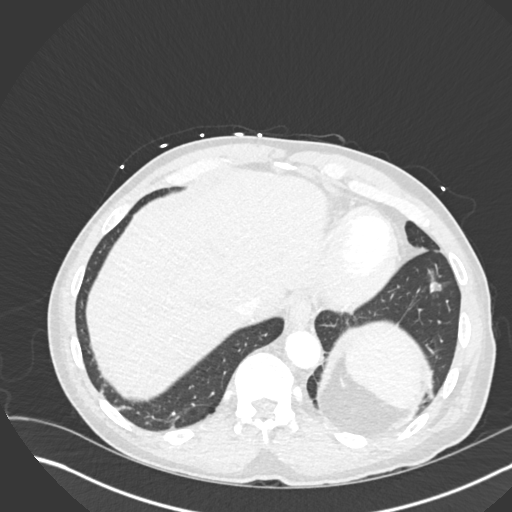
[im 50/161  lung]
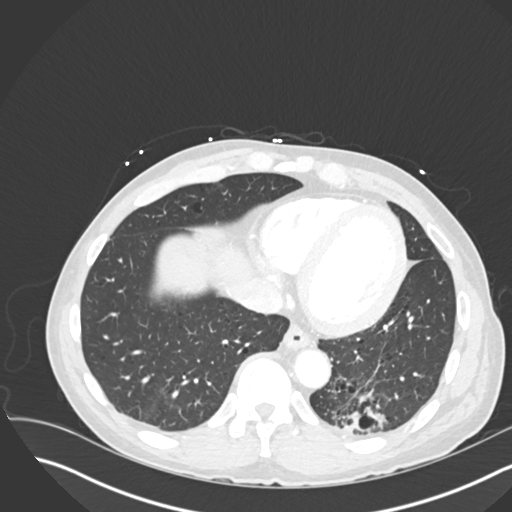
[im 62/161  mediastinal]
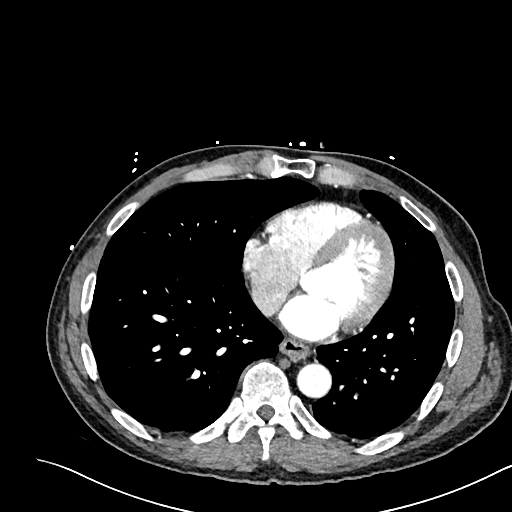
[im 62/161  lung]
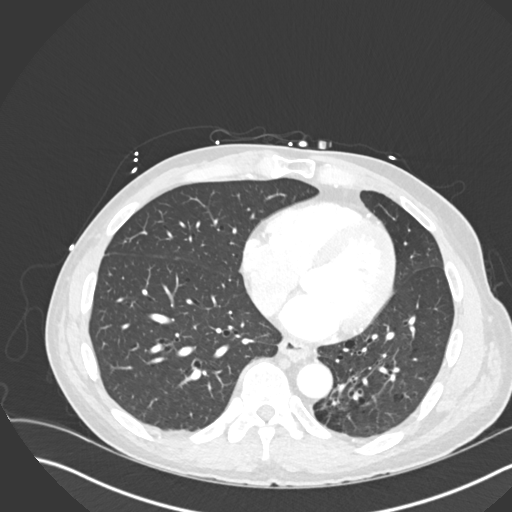
[im 74/161  lung]
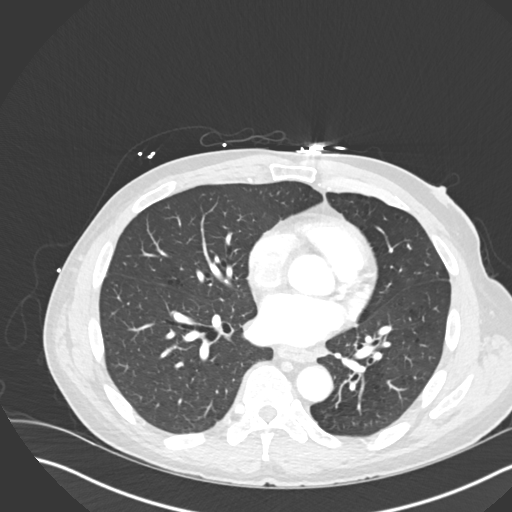
[im 87/161  lung]
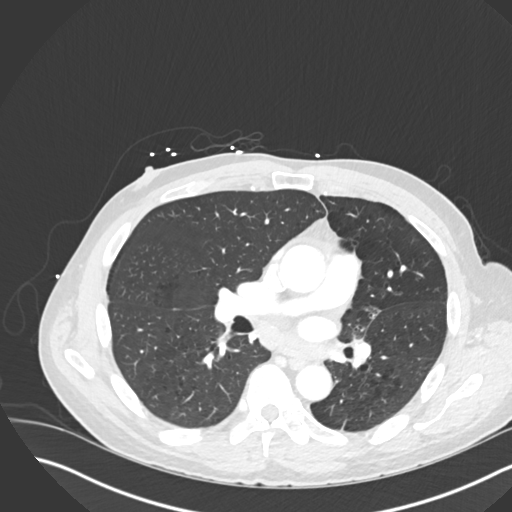
[im 99/161  lung]
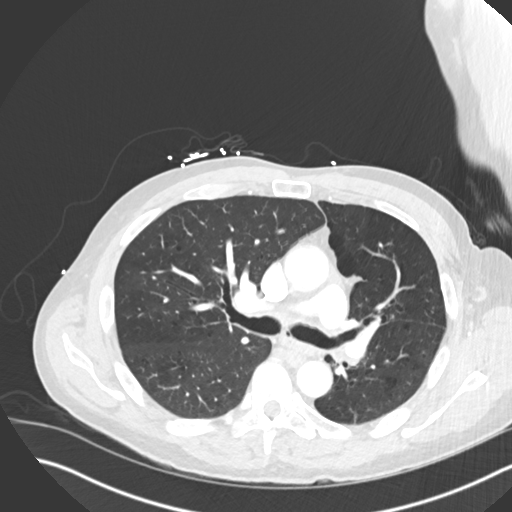
[im 111/161  mediastinal]
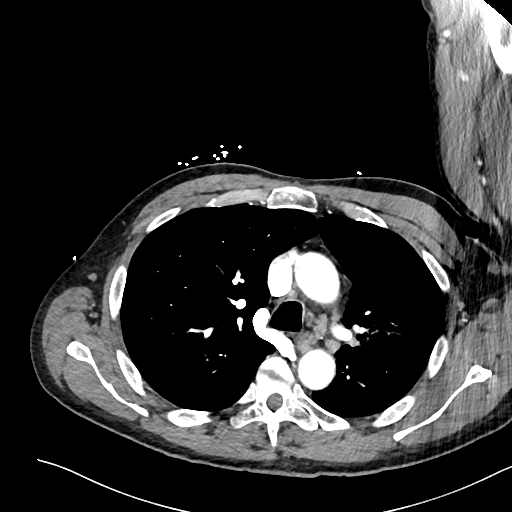
[im 111/161  lung]
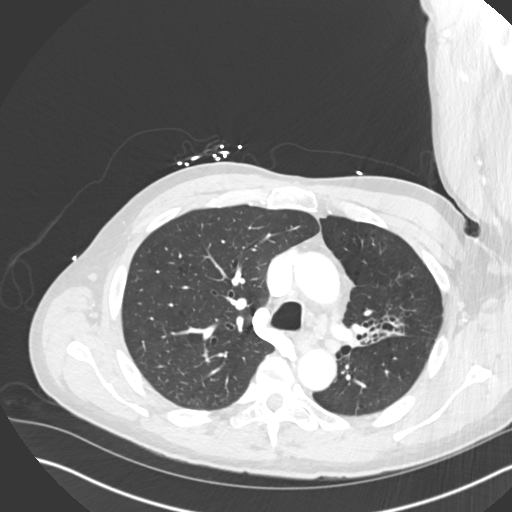
[im 124/161  lung]
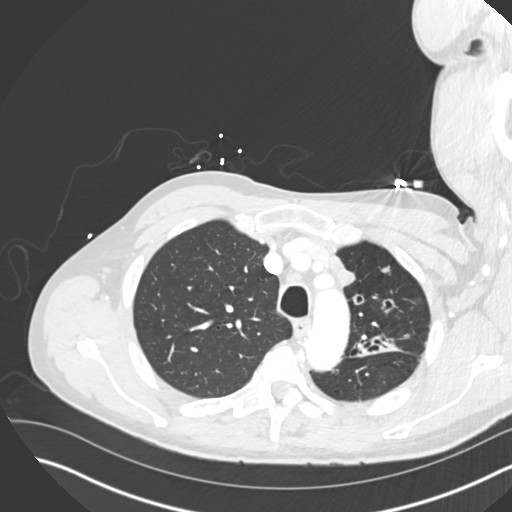
[im 136/161  lung]
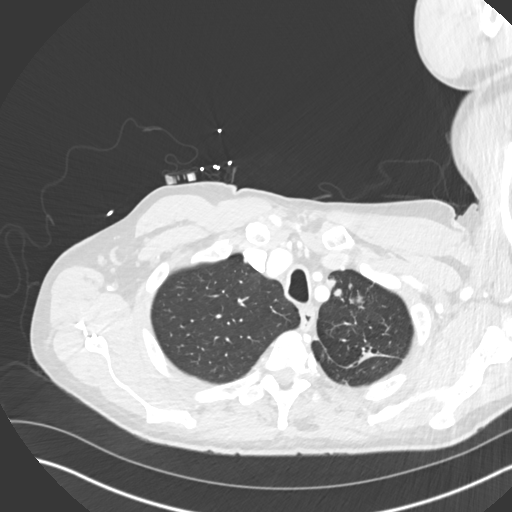
[im 148/161  lung]
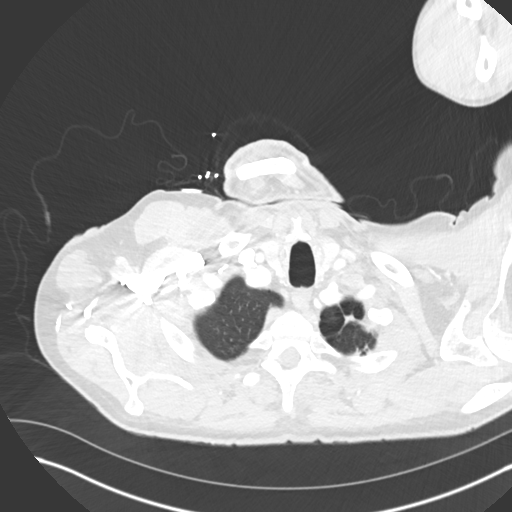

[Series 6: cor · coronal · 0.67mm/px · 3 of 154 slices shown]
[im 31/154  lung]
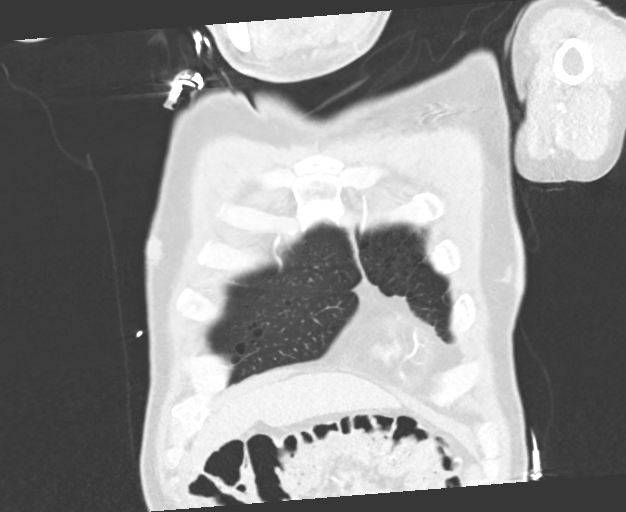
[im 62/154  lung]
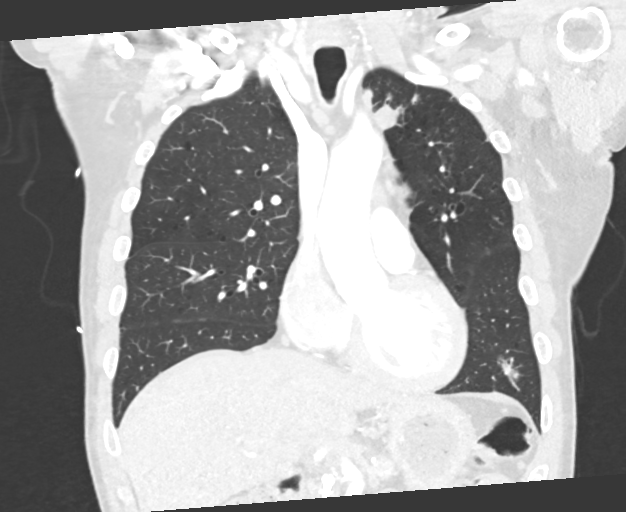
[im 92/154  lung]
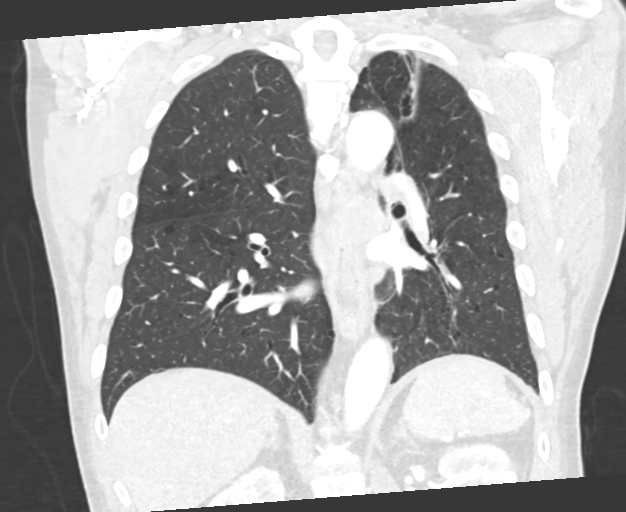

[15 of 36 positions shown; findings below may reference images not displayed]

FINDINGS: Cardiovascular: Atherosclerotic calcification of the aorta. Heart is
at the upper limits of normal to mildly enlarged with left
ventricular dilatation. No pericardial effusion.

Mediastinum/Nodes: Mediastinal lymph nodes measure up to 12 mm in
the low left paratracheal station. Hilar lymph nodes measure up to
11 mm on the left. Subcarinal lymph nodes measure up to 1.7 cm. No
axillary adenopathy. Periesophageal lymph nodes measure up to 12 mm
(3/62). There may be slight distal esophageal wall thickening.

Lungs/Pleura: Centrilobular and paraseptal emphysema. Nodular and
cavitary areas of airspace consolidation in the left upper and left
lower lobes with areas of bronchiectasis and volume loss. Minimal
dependent atelectasis in the right lower lobe. No pleural fluid.
Airway is otherwise unremarkable.

Upper Abdomen: Visualized portions of the liver and adrenal glands
are unremarkable. Scattered scarring in the kidneys. Subcentimeter
low-attenuation lesions in the kidneys are too small to
characterize. Spleen is unremarkable. Chronic calcific pancreatitis.
There may be a small hiatal hernia. Visualized portions of the
stomach and bowel are otherwise grossly unremarkable. Upper
abdominal lymph nodes measure up to 12 mm in the periportal region.

Musculoskeletal: No worrisome lytic or sclerotic lesions. Disc
calcification is seen at T10-11.
IMPRESSION: 1. Nodular and cavitary airspace consolidation in the left upper and
left lower lobes, findings most indicative of an atypical/fungal
infectious process. Underlying malignancy cannot be excluded.
2. Mediastinal and bihilar adenopathy, possibly reactive. Again,
malignancy cannot be excluded.
3. Follow-up CT chest with contrast in 4-6 weeks is recommended in
further evaluation of the above findings, as clinically indicated.
4. Areas of volume loss and bronchiectasis in the left upper and
left lower lobes may be due to resolving infection/post infectious
scarring.
5. Chronic calcific pancreatitis.
6.  Aortic atherosclerosis ([T0]-[T0]).
7.  Emphysema ([T0]-[T0]).

## 2020-03-27 MED ORDER — DILTIAZEM LOAD VIA INFUSION
20.0000 mg | Freq: Once | INTRAVENOUS | Status: DC
  Filled 2020-03-27: qty 20

## 2020-03-27 MED ORDER — LEVALBUTEROL HCL 1.25 MG/0.5ML IN NEBU
1.2500 mg | INHALATION_SOLUTION | Freq: Four times a day (QID) | RESPIRATORY_TRACT | Status: DC | PRN
  Filled 2020-03-27: qty 0.5

## 2020-03-27 MED ORDER — DILTIAZEM HCL-DEXTROSE 125-5 MG/125ML-% IV SOLN (PREMIX)
5.0000 mg/h | INTRAVENOUS | Status: DC
  Filled 2020-03-27: qty 125

## 2020-03-27 MED ORDER — MAGNESIUM SULFATE 2 GM/50ML IV SOLN
2.0000 g | Freq: Once | INTRAVENOUS | Status: DC
  Filled 2020-03-27: qty 50

## 2020-03-27 MED ORDER — AMLODIPINE BESYLATE 5 MG PO TABS
5.0000 mg | ORAL_TABLET | Freq: Every day | ORAL | Status: DC
  Administered 2020-03-27: 5 mg via ORAL
  Filled 2020-03-27 (×2): qty 1

## 2020-03-27 MED ORDER — IOHEXOL 300 MG/ML  SOLN
75.0000 mL | Freq: Once | INTRAMUSCULAR | Status: AC | PRN
  Administered 2020-03-27: 75 mL via INTRAVENOUS

## 2020-03-27 MED ORDER — METOPROLOL TARTRATE 5 MG/5ML IV SOLN
5.0000 mg | Freq: Once | INTRAVENOUS | Status: AC
  Administered 2020-03-27: 5 mg via INTRAVENOUS
  Filled 2020-03-27: qty 5

## 2020-03-27 MED ORDER — ADENOSINE 6 MG/2ML IV SOLN
6.0000 mg | Freq: Once | INTRAVENOUS | Status: DC
  Filled 2020-03-27: qty 2

## 2020-03-27 NOTE — Progress Notes (Signed)
Pt's sister called for update, pt wishes to completely update his sister himself. This RN will not call back to update d/t pt stating "my sister will tell everybody, she can't hold water".

## 2020-03-27 NOTE — Progress Notes (Signed)
Pt given metop as ordered, pt's HR still elevated in the 140's. Dr. Hal Hope informed.

## 2020-03-27 NOTE — Progress Notes (Addendum)
Pt's HR 185 asymptomatic. Dr. Hal Hope informed. EKG being gathered by chrg nurse this shift. Dr. Olevia Bowens informed

## 2020-03-27 NOTE — Progress Notes (Addendum)
PROGRESS NOTE    Ricky [name]   ZOX:[id number]  DOB: [birth year]  DOA: 03/26/2020 PCP: Merryl Hacker, No   Brief Narrative:  Ricky [name] is a 65 year old male with HIV who presented to the hospital with multiple complaints including dizziness, blurred vision, sinus pressure and photophobia.  To above-mentioned symptoms and exam findings, there was a suspicion that the patient may have meningitis and thus an LP was performed in the ED. 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 These findings specifically included a positive cryptococcal antigen and a cryptococcal antigen titer of 2560.  Opening pressure was 27 and closing pressure was 21.  6 cc of CSF was removed. Patient was started on amphotericin B, methylprednisolone and flucytosine in the ED. Also, in the ED, he was found to have a flutter with RVR and started on a Cardizem infusion. He was leukopenic with a WBC count of 3.7.  Subjective: He states all of his symptoms have resolved.    Assessment & Plan:   Principal Problem:   Cryptococcal meningitis  -Continue antifungal treatments per ID -I have ordered neuro checks every 4 hours to assess if his symptoms recur as he may need further drainage of CSF  Active Problems:    Atrial flutter with RVR -He believes he has a prior history of this and thinks he was on medication for it -TSH checked and is within normal limits -2D echo has been ordered and is pending -He has sinus bradycardia at this time-continue to follow on telemetry -Have not ordered any anticoagulation as of yet - CHA2DS2-VASc Score is 1  LUL infiltrate - he states he has a chronic cough and sputums is sometimes clear and sometimes colored - obtain CT chest and sputum culture    Moderate protein malnutrition,  Hypoalbuminemia -Continue dietary supplements    Human immunodeficiency virus (HIV) disease -Awaiting CD4 count and viral load -Patient states that he was previously on medications but  it has been over a year since he has been on them-at this time he does not have a PCP and does not follow-up with an ID physician     Hypertension - BP mildly elevated- start Norvasc 5 mg daily and follow   Time spent in minutes: 35 DVT prophylaxis: SCDs Start: 03/26/20 1946  Code Status: stable Family Communication:  Level of Care: Level of care: Progressive Disposition Plan:  Status is: Inpatient  Remains inpatient appropriate because:IV treatments appropriate due to intensity of illness or inability to take PO   Dispo: The patient is from: Home              Anticipated d/c is to: Home              Anticipated d/c date is: 3 days              Patient currently is not medically stable to d/c.   Difficult to place patient No      Consultants:   ID  Neurology Procedures:   LP Antimicrobials:  Anti-infectives (From admission, onward)   Start     Dose/Rate Route Frequency Ordered Stop   03/27/20 2115  amphotericin B liposome (AMBISOME) 300 mg in dextrose 5 % 500 mL IVPB  Status:  Discontinued        300 mg 287.5 mL/hr over 120 Minutes Intravenous Every 24 hours 03/26/20 2024 03/26/20 2227   03/26/20 2227  amphotericin B liposome (AMBISOME) 300 mg in dextrose 5 % 500 mL IVPB  300 mg 287.5 mL/hr over 120 Minutes Intravenous Every 24 hours 03/26/20 2227     03/26/20 1945  amphotericin B liposome (AMBISOME) 300 mg in dextrose 5 % 500 mL IVPB  Status:  Discontinued        300 mg 287.5 mL/hr over 120 Minutes Intravenous Every 24 hours 03/26/20 1850 03/26/20 2024   03/26/20 1900  flucytosine (ANCOBON) capsule 2,000 mg        25 mg/kg  80 kg Oral Every 6 hours 03/26/20 1850         Objective: Vitals:   03/27/20 0310 03/27/20 0350 03/27/20 0510 03/27/20 0710  BP: 125/70  133/87 (!) 160/93  Pulse: (!) 185 67 60 60  Resp: 18  18   Temp: 98.9 F (37.2 C)  98 F (36.7 C) 97.8 F (36.6 C)  TempSrc: Oral  Oral Oral  SpO2: 96%  96% 96%  Weight:      Height:         Intake/Output Summary (Last 24 hours) at 03/27/2020 0923 Last data filed at 03/27/2020 0631 Gross per 24 hour  Intake 1814.18 ml  Output 1200 ml  Net 614.18 ml   Filed Weights   03/26/20 1839 03/27/20 0141  Weight: 80 kg 80.8 kg    Examination: General exam: Appears comfortable  HEENT: PERRLA, oral mucosa moist, no sclera icterus or thrush Respiratory system: Clear to auscultation. Respiratory effort normal. Cardiovascular system: S1 & S2 heard, RRR.   Gastrointestinal system: Abdomen soft, non-tender, nondistended. Normal bowel sounds. Central nervous system: Alert and oriented. No focal neurological deficits. Extremities: No cyanosis, clubbing or edema Skin: No rashes or ulcers Psychiatry:  Mood & affect appropriate.     Data Reviewed: I have personally reviewed following labs and imaging studies  CBC: Recent Labs  Lab 03/26/20 0958 03/27/20 0336  WBC 3.7* 5.4  NEUTROABS 2.2  --   HGB 13.9 14.3  HCT 42.3 42.4  MCV 101.2* 99.8  PLT 325 962   Basic Metabolic Panel: Recent Labs  Lab 03/26/20 0958 03/26/20 1829 03/27/20 0336  NA 135  --  131*  K 4.2  --  4.0  CL 101  --  98  CO2 25  --  20*  GLUCOSE 117*  --  234*  BUN 12  --  10  CREATININE 1.16  --  1.09  CALCIUM 8.6*  --  8.6*  MG  --  1.8 2.1  PHOS  --  2.9  --    GFR: Estimated Creatinine Clearance: 79.4 mL/min (by C-G formula based on SCr of 1.09 mg/dL). Liver Function Tests: Recent Labs  Lab 03/26/20 0958 03/27/20 0336  AST 27 30  ALT 16 18  ALKPHOS 104 95  BILITOT 1.1 1.0  PROT 7.3 7.6  ALBUMIN 2.9* 3.0*   No results for input(s): LIPASE, AMYLASE in the last 168 hours. No results for input(s): AMMONIA in the last 168 hours. Coagulation Profile: No results for input(s): INR, PROTIME in the last 168 hours. Cardiac Enzymes: No results for input(s): CKTOTAL, CKMB, CKMBINDEX, TROPONINI in the last 168 hours. BNP (last 3 results) No results for input(s): PROBNP in the last 8760  hours. HbA1C: No results for input(s): HGBA1C in the last 72 hours. CBG: No results for input(s): GLUCAP in the last 168 hours. Lipid Profile: No results for input(s): CHOL, HDL, LDLCALC, TRIG, CHOLHDL, LDLDIRECT in the last 72 hours. Thyroid Function Tests: No results for input(s): TSH, T4TOTAL, FREET4, T3FREE, THYROIDAB in the last 72 hours.  Anemia Panel: No results for input(s): VITAMINB12, FOLATE, FERRITIN, TIBC, IRON, RETICCTPCT in the last 72 hours. Urine analysis: No results found for: COLORURINE, APPEARANCEUR, LABSPEC, PHURINE, GLUCOSEU, HGBUR, BILIRUBINUR, KETONESUR, PROTEINUR, UROBILINOGEN, NITRITE, LEUKOCYTESUR Sepsis Labs: @LABRCNTIP (procalcitonin:4,lacticidven:4) ) Recent Results (from the past 240 hour(s))  CSF culture     Status: None (Preliminary result)   Collection Time: 03/26/20  5:05 PM   Specimen: CSF; Cerebrospinal Fluid  Result Value Ref Range Status   Specimen Description CSF  Final   Special Requests NONE  Final   Gram Stain   Final    WBC PRESENT,BOTH PMN AND MONONUCLEAR CRYPTOCOCCUS NEOFORMANS CYTOSPIN SMEAR Performed at [facility], [address] [place], [place] [address]    Culture PENDING  Incomplete   Report Status PENDING  Incomplete         Radiology Studies: MR Brain W and Wo Contrast  Result Date: 03/26/2020 CLINICAL DATA:  Neuro deficit, acute stroke suspected. Vision changes, headache. EXAM: MRI HEAD WITHOUT AND WITH CONTRAST TECHNIQUE: Multiplanar, multiecho pulse sequences of the brain and surrounding structures were obtained without and with intravenous contrast. CONTRAST:  11mL GADAVIST GADOBUTROL 1 MMOL/ML IV SOLN COMPARISON:  Same day CTV. FINDINGS: Motion limited study. Brain: No acute infarction, hemorrhage, hydrocephalus, extra-axial collection or mass lesion. Vascular: Major arterial flow voids are maintained at the skull base. The area of abnormality seen on same-day CTV is poorly characterized on this study secondary to  motion and flow related artifacts. Skull and upper cervical spine: Normal marrow signal. Sinuses/Orbits: Mild ethmoid air cell mucosal thickening without air-fluid levels. Unremarkable orbits. Other: No mastoid effusions. IMPRESSION: 1. No evidence of acute nonvascular abnormality. 2. The area of abnormality seen on same-day CTV is poorly characterized on this study secondary to motion and flow related artifact. While the appearance on the CTV was atypical in appearance/morphology for acute thrombus, a follow up CTV in approximately one week could evaluate for change if the patient's symptoms do not improve or worsen. Electronically Signed   By: Margaretha Sheffield MD   On: 03/26/2020 14:48   CT VENOGRAM HEAD  Result Date: 03/26/2020 CLINICAL DATA:  Dural venous sinus thrombosis suspected. Headache. On antibiotics for sinusitis. EXAM: CT VENOGRAM HEAD TECHNIQUE: Contiguous axial images were obtained from the base of the skull through the vertex during the bolus administration of intravenous contrast using CTV timing. Multiplanar CT image reconstructions and MIPs were obtained to evaluate the venous anatomy. CONTRAST:  79mL OMNIPAQUE IOHEXOL 350 MG/ML SOLN COMPARISON:  None. FINDINGS: Brain: No evidence of acute large vascular territory infarct, acute hemorrhage, hydrocephalus, extra-axial fluid collection or mass lesion. Mild patchy white matter hypoattenuation is nonspecific but most likely related to chronic microvascular ischemic disease. Vascular: Calcific atherosclerosis. Major arteries are grossly patent where visualized. Small focal hypodensity along the superior aspect of the superior sagittal sinus, possibly extradural in location (see series 10, image 134 and series 5, image 67). Otherwise, dural sinuses are patent. Small left transverse sinus. Skull: No acute fracture. Sinuses/orbits: Scattered ethmoid air cell mucosal thickening without air-fluid level. Unremarkable orbits. Other: No mastoid effusions.  IMPRESSION: 1. Small focal hypodensity along the superior aspect of the superior sagittal sinus, possibly extradural in location and not typical in appearance/morphology for acute thrombus. Per Dr. Sherry Ruffing an MRI with and without contrast has already been ordered, which will further evaluate. 2. Ethmoid air cell mucosal thickening without air-fluid levels. Findings discussed with Dr. Sherry Ruffing via telephone at 12:36 PM. Electronically Signed   By:  Margaretha Sheffield MD   On: 03/26/2020 12:42      Scheduled Meds: . dextrose  10 mL Intravenous Q24H  . dextrose  10 mL Intravenous Q24H  . diltiazem  20 mg Intravenous Once  . flucytosine  25 mg/kg Oral Q6H  . lidocaine  10 mL Infiltration Once  . sodium chloride  500 mL Intravenous Q24H  . sodium chloride  500 mL Intravenous Q24H   Continuous Infusions: . sodium chloride 75 mL/hr at 03/27/20 0133  . amphotericin  B  Liposome (AMBISOME) ADULT IV 300 mg (03/26/20 2240)  . diltiazem (CARDIZEM) infusion       LOS: 1 day      Debbe Odea, MD [facility] Pager: [email or web address] 03/27/2020, 9:23 AM

## 2020-03-27 NOTE — Consult Note (Addendum)
Neurology Consultation  Reason for Consult: Headache, dizziness, blurry vision for 2-3 weeks  Referring Physician: Dr. Juleen China  CC: Progressive, constant headache with dizziness and blurry vision for 2-3 weeks  History is obtained from: Patient, chart review  HPI: Ricky [name] is a 65 y.o. male with a medical history significant for HIV not on antiviral medications since the 3154'M, history of illicit drug use, tobacco use, and recent COVID-19 infection (03/03/20), and left upper lobe pneumonia on Augmentin, who presented to the ED 03/26/20 for evaluation of constant, progressive headache with dizziness, photophobia, sinus pressure, diaphoresis, rhinorrhea, productive cough, and blurry vision for the past 2-3 weeks. Patient endorses that his headache had a pulsating quality and states improvement with blood pressure control overnight though he does not take antihypertensive medications at home. He was using over-the-counter acetaminophen for his headaches without relief despite increasing the medication amount and frequency. His headache started as right eye pressure and is worsened with palpation of his sinuses and has spread upwards noting right frontal head pressure. He denies any nuchal rigidity, nausea, vomiting, diarrhea, or shortness of breath.   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    03/27/20: Patient reports subjective improvement in headache. He states headache was 7/10 on hospital arrival and after acetaminophen administration this morning his headache is rated a 2/10 but he remains photophobic. He also reports cessation of visual disturbance today with improvement in headache severity. He does endorse still feeling lightheaded with standing and  feeling uncoordinated with his movements but attributes this to analgesic medications.   ROS: A 14 point ROS was performed and is negative except as noted in the HPI.   Past Medical History:  Diagnosis Date  . Human immunodeficiency virus (HIV) disease ([place]) 03/26/2020   Family History  Problem Relation Age of Onset  . Hypertension Mother   . Kidney disease Mother   . Hypertension Sister   . Hypertension Brother    Social History:   reports that he has been smoking cigarettes. He does not have any smokeless tobacco history on file. He reports current alcohol use of about 14.0 - 21.0 standard drinks of alcohol per week. He reports previous drug use.  Medications Current Outpatient Medications  Medication Instructions  . amoxicillin-clavulanate (AUGMENTIN) 875-125 MG tablet 1 tablet, Oral, 2 times daily, One po bid x 7 days  . benzonatate (TESSALON) 100 mg, Oral, Every 8 hours    Current Facility-Administered Medications:  .  0.45 % sodium chloride infusion, , Intravenous, Continuous, Reubin Milan, MD, Last Rate: 75 mL/hr at 03/27/20 0133, New Bag at 03/27/20 0133 .  acetaminophen (TYLENOL) tablet 650 mg, 650 mg, Oral, Q6H PRN **OR** acetaminophen (TYLENOL) suppository 650 mg, 650 mg, Rectal, Q6H PRN, Reubin Milan, MD .  acetaminophen (TYLENOL) tablet 650 mg, 650 mg, Oral, Daily PRN, Reubin Milan, MD, 650 mg at 03/27/20 0867 .  amphotericin B liposome (AMBISOME) 300 mg in dextrose 5 % 500 mL IVPB, 300 mg, Intravenous, Q24H, Reubin Milan, MD, Last Rate: 287.5 mL/hr at 03/26/20 2240, 300 mg at 03/26/20 2240 .  dextrose 5 % 10 mL, 10 mL, Intravenous, Q24H, Reubin Milan, MD, 10 mL at 03/27/20 0047 .  dextrose 5 % 10 mL, 10 mL, Intravenous, Q24H, Olevia Bowens,  Gerri Lins, MD, 10 mL at 03/26/20 2201 .  diltiazem (CARDIZEM) 1 mg/mL load via infusion 20 mg, 20 mg, Intravenous, Once **AND** diltiazem (CARDIZEM) 125 mg in dextrose 5% 125 mL (1 mg/mL) infusion, 5-15  mg/hr, Intravenous, Continuous, Reubin Milan, MD .  diphenhydrAMINE (BENADRYL) injection 25 mg, 25 mg, Intravenous, Daily PRN **OR** diphenhydrAMINE (BENADRYL) capsule 25 mg, 25 mg, Oral, Daily PRN, Reubin Milan, MD .  flucytosine (ANCOBON) capsule 2,000 mg, 25 mg/kg, Oral, Q6H, Reubin Milan, MD, 2,000 mg at 03/27/20 4765 .  hydrALAZINE (APRESOLINE) tablet 25 mg, 25 mg, Oral, Q4H PRN, Reubin Milan, MD .  levalbuterol Penne Lash) nebulizer solution 1.25 mg, 1.25 mg, Nebulization, Q6H PRN, Reubin Milan, MD .  lidocaine (XYLOCAINE) 2 % (with pres) injection 200 mg, 10 mL, Infiltration, Once, Reubin Milan, MD .  morphine 4 MG/ML injection 4 mg, 4 mg, Intravenous, Q3H PRN, Reubin Milan, MD, 4 mg at 03/27/20 0103 .  ondansetron (ZOFRAN) tablet 4 mg, 4 mg, Oral, Q6H PRN **OR** ondansetron (ZOFRAN) injection 4 mg, 4 mg, Intravenous, Q6H PRN, Reubin Milan, MD .  oxyCODONE (Oxy IR/ROXICODONE) immediate release tablet 5 mg, 5 mg, Oral, Q4H PRN, Reubin Milan, MD, 5 mg at 03/26/20 2251 .  sodium chloride 0.9 % bolus 500 mL, 500 mL, Intravenous, Q24H, Reubin Milan, MD, 500 mL at 03/26/20 2000 .  sodium chloride 0.9 % bolus 500 mL, 500 mL, Intravenous, Q24H, Reubin Milan, MD, 500 mL at 03/27/20 0051  Exam: Current vital signs: BP (!) 160/93 (BP Location: Left Wrist)   Pulse 60   Temp 97.8 F (36.6 C) (Oral)   Resp 18   Ht 6\' 1"  (1.854 m)   Wt 80.8 kg   SpO2 96%   BMI 23.50 kg/m  Vital signs in last 24 hours: Temp:  [97.8 F (36.6 C)-99.9 F (37.7 C)] 97.8 F (36.6 C) (02/22 0710) Pulse Rate:  [53-185] 60 (02/22 0710) Resp:  [17-20] 18 (02/22 0510) BP: (125-205)/(70-120) 160/93 (02/22 0710) SpO2:  [95 %-99 %] 96 % (02/22 0710) Weight:  [80 kg-80.8 kg] 80.8 kg (02/22 0141)  GENERAL: Awake, alert, sitting up in bed eating breakfast, in no acute distress Head: - Normocephalic and atraumatic,without obvious abnormality EENT:  No OP obstruction, normal conjunctiva, photophobia present with assessment of right eye. Oral thrush visualized on posterior pharynx.  LUNGS - Normal respiratory effort, non-labored breathing. Productive cough on assessment.  CV - Extremities warm, without edema ABDOMEN - Soft, non-tender, non-distended Ext: warm, without obvious deformity  NEURO:  Mental Status: alert and oriented to person, place, time, and situation. Patient is able to give a clear and coherent history of present illness. His speech is without dysarthria or aphasia. No neglect is present. Naming, comprehension, and fluency are intact. Repetition is partially intact with omission of 2-3 words in a given phrase. Patient is able to follow all commands.  Cranial Nerves:  II: PERRL 3 mm/brisk. Visual fields full.  III, IV, VI: EOMI. Lid elevation symmetric and full without noted ptosis V: Sensation is intact to light touch and symmetrical to face at the level of the cheeks. Patient reports decreased sensation to light touch of the right forehead compared to the left forehead.  VII: Face is symmetric resting and similing. Able to puff cheeks and raise eyebrows.  VIII: Hearing intact to voice IX, X: Palate elevation is symmetric. Phonation normal.  XI: Normal sternocleidomastoid and trapezius muscle strength XII: Tongue protrudes midline  without fasciculations.   Motor: 5/5 strength is all muscle groups with antigravity movement and without drift.  Tone is normal. Bulk is normal.  Sensation- Intact to light touch bilaterally in all four extremities. Extinction intact. 2 point discrimination.  Coordination: FTN intact bilaterally. HKS intact bilaterally. No pronator drift.  Plantars: Toes downgoing bilaterally  DTRs: 2+ bilateral biceps, 1+ patellae bilaterally.  Gait- deferred  Labs I have reviewed labs in epic and the results pertinent to this consultation are: CBC    Component Value Date/Time   WBC 5.4 03/27/2020 0336    RBC 4.25 03/27/2020 0336   HGB 14.3 03/27/2020 0336   HCT 42.4 03/27/2020 0336   PLT 324 03/27/2020 0336   MCV 99.8 03/27/2020 0336   MCH 33.6 03/27/2020 0336   MCHC 33.7 03/27/2020 0336   RDW 11.8 03/27/2020 0336   LYMPHSABS 0.6 (L) 03/26/2020 0958   MONOABS 0.7 03/26/2020 0958   EOSABS 0.2 03/26/2020 0958   BASOSABS 0.0 03/26/2020 0958  CMP     Component Value Date/Time   NA 131 (L) 03/27/2020 0336   K 4.0 03/27/2020 0336   CL 98 03/27/2020 0336   CO2 20 (L) 03/27/2020 0336   GLUCOSE 234 (H) 03/27/2020 0336   BUN 10 03/27/2020 0336   CREATININE 1.09 03/27/2020 0336   CALCIUM 8.6 (L) 03/27/2020 0336   PROT 7.6 03/27/2020 0336   ALBUMIN 3.0 (L) 03/27/2020 0336   AST 30 03/27/2020 0336   ALT 18 03/27/2020 0336   ALKPHOS 95 03/27/2020 0336   BILITOT 1.0 03/27/2020 0336   GFRNONAA >60 03/27/2020 0336   Results for orders placed or performed during the hospital encounter of 03/26/20  CSF culture     Status: None (Preliminary result)   Collection Time: 03/26/20  5:05 PM   Specimen: CSF; Cerebrospinal Fluid  Result Value Ref Range Status   Specimen Description CSF  Final   Special Requests NONE  Final   Gram Stain   Final    WBC PRESENT,BOTH PMN AND MONONUCLEAR CRYPTOCOCCUS NEOFORMANS CYTOSPIN SMEAR Performed at [facility], [address], [place], [place] [address]    Culture PENDING  Incomplete   Report Status PENDING  Incomplete   HIV: Reactive (02/21 0958)  CSF opening pressure: 27 CSF Crypto Anitgen: positive Cryptococcal Ag Titer: 2,560 CSF glucose 37, RBC 450, WBC 17, Segmented neutrophils 10, lymphocytes 42, monocyte-macrophage 48, total protein 140  Lab Results  Component Value Date   ESRSEDRATE 30 (H) 03/26/2020   Imaging I have reviewed the images obtained: CT Venogram head: 1. Small focal hypodensity along the superior aspect of the superior sagittal sinus, possibly extradural in location and not typical in appearance/morphology for acute  thrombus. Per Dr. Sherry Ruffing an MRI with and without contrast has already been ordered, which will further evaluate. 2. Ethmoid air cell mucosal thickening without air-fluid levels. MRI examination of the brain  MRI brain:  1. No evidence of acute nonvascular abnormality. 2. The area of abnormality seen on same-day CTV is poorly characterized on this study secondary to motion and flow related artifact. While the appearance on the CTV was atypical in appearance/morphology for acute thrombus, a follow up CTV in approximately one week could evaluate for change if the patient's symptoms do not improve or worsen.  Assessment: 65 year old male with history of HIV not on antivirals for over 20-years presented with a progressive, constant headache for 2-3 weeks following COVID-19 infection and was subsequently diagnosed with cryptococcal meningitis. - Examination 2/22  significant for improving headache pressure quality but with persistent photophobia and subjective improvement in visual disturbances. Patient denies current or past nuchal rigidity.  - Brain imaging without acute large vascular territory infarct, acute hemorrhage, hydrocephalus but with mild patchy white matter hypoattenuation that is nonspecific; possibly related to chronic microvascular ischemic disease. CT venogram with small focal hypodensity along the superior aspect of the sagittal sinus, possibly extradural in location and not typical in appearance for acute thrombus. Low suspicion for acute thrombus at this time.  - ID on board; patient on amphotericin B and flucytosine for management of cryptococcal meningitis. Patient also with persistent left upper lobe infiltrate consistent with pneumonia on chest x-ray previously treated with Augmentin.  - Headache felt to be consistent with current infection with improvement after LP and initiation of antifungal therapy without acute intracranial process on initial imaging. Headache also without sudden  onset, nausea, vomiting, persistent visual disturbance, leukocytosis, no evidence of hemorrhage on examination, or resistant hypertension. - Atrial flutter identified on ECG during hospitalization, patient on diltiazem, anticoagulation per cardiology. Pending echocardiogram.   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 I would recommend recalling [place] if he complains of further headaches and at that time decision can be made on performing the LP. -If anticoagulation is warranted, keep him on a heparin drip should he need serial LPs so that heparin drip can be discontinued, tap performed and anticoagulation can be resumed with heparin. - Neurology will be available for further questions on an as needed basis.   Anibal Henderson, AGAC-NP [facility] Pager: [phone]  Attending Neurohospitalist Addendum Patient seen and examined with APP/Resident. Agree with the history and physical as documented above. Agree with the plan as documented, which I helped formulate. I have independently reviewed the chart, obtained history, review of systems and examined the patient.I have personally reviewed pertinent head/neck/spine imaging (CT/MRI).   Case was discussed with me yesterday by the ED provider and provided recommendations for imaging as well as spinal tap. Presentation was very suspicious for cryptococcal  meningitis given headaches, oral thrush and unremarkable imaging studies.   Agree with the plan above. Discussed with Dr. Wynelle Cleveland.   Please feel free to call with any questions. -- Amie Portland, MD Neurologist [facility] Pager: [phone]

## 2020-03-27 NOTE — Progress Notes (Signed)
Around 1910, tele called to say HR is up in the 180s.  I immediately went to room and assessed pt.  He was in no physical distress.  Said he did not really even feel any different.  Full set of vitals were done and his heart rate was in the 170s.  I did call Dr Hal Hope and he called back and said he will order a dose of IV metoprolol and see if that helps.

## 2020-03-27 NOTE — Plan of Care (Signed)

## 2020-03-27 NOTE — Consult Note (Signed)
[facility] for Infectious Disease  Total days of antibiotics 2/ampho plus flucytosine               Reason for Consult:cryptococcal meningitis   Referring Physician: rizwan  Principal Problem:   Cryptococcal meningitis ([place]) Active Problems:   Leukocytopenia   Macrocytosis   Moderate protein malnutrition (HCC)   Hypoalbuminemia   Human immunodeficiency virus (HIV) disease (HCC)   Atrial flutter (HCC)   Aortic atherosclerosis ([place])   Hypertension    HPI: Ricky [name] is a 64 y.o. male with history of HIV disease, last on ART roughly 2-3 years ago. Admitted for 3 week history of headache. Recently recovered from covid, contracted early February 2022. He was admitted to the hospital for suspicion for CM since he has been off of ART, plus elevated OP on LP, with CSF showing +CrAg, titer of 1:2560. CSF fluid analysis only showed WBC of 17, low glucose of 37, and protein of 140. CSF smear + cryptococcus neoformans. He was started on L-ampho plus flucytosine. HIV labs pending. He states that he previously had his HIV care in [place] but has been off of meds for over 2 years. He was unable to recall his full HIV regimen but apears that he was on protease inhibitor regimen. He underwent CXR that showed LUL infiltrate also present in 03/03/20 at time of covid diagnosis, unchanged. Had brain MRI which was limited to movement did not show infarct,but made mention to repeat.  He is homeless/lives in shelter. At the time of covid diagnosis, he was seen in the ED for overdose, treated with narcan. + positive for covid. Where his CXR showed LUL that they attributed to viral pneumonia rather than possible aspiration pneumonia. He did receive amox/clav but unclear if he filled prescription to complete course.   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  Past Medical History:  Diagnosis Date  . Human immunodeficiency virus (HIV) disease ([place]) 03/26/2020    Allergies: No Known  Allergies  MEDICATIONS: . dextrose  10 mL Intravenous Q24H  . dextrose  10 mL Intravenous Q24H  . diltiazem  20 mg Intravenous Once  . flucytosine  25 mg/kg Oral Q6H  . lidocaine  10 mL Infiltration Once  . sodium chloride  500 mL Intravenous Q24H  . sodium chloride  500 mL Intravenous Q24H    Social History   Tobacco Use  . Smoking status: Current Every Day Smoker    Types: Cigarettes  Substance Use Topics  . Alcohol use: Yes    Alcohol/week: 14.0 - 21.0 standard drinks    Types: 14 - 21 Cans of beer per week  . Drug use: Not Currently    Family History  Problem Relation Age of Onset  . Hypertension Mother   . Kidney disease Mother   . Hypertension Sister   . Hypertension Brother      Review of Systems  Constitutional: Negative for fever, chills, diaphoresis, activity change, appetite change, fatigue and unexpected weight change.  HENT: Negative for congestion, sore throat, rhinorrhea, sneezing, trouble swallowing and sinus pressure.  Eyes: Negative for photophobia and visual disturbance.  Respiratory: Negative for cough, chest tightness, shortness of breath, wheezing and stridor.  Cardiovascular: Negative for chest pain, palpitations and leg swelling.  Gastrointestinal: Negative for nausea, vomiting, abdominal pain, diarrhea, constipation, blood in stool, abdominal distention and anal bleeding.  Genitourinary: Negative for dysuria, hematuria, flank pain and difficulty urinating.  Musculoskeletal: Negative for myalgias, back pain,  joint swelling, arthralgias and gait problem.  Skin: Negative for color change, pallor, rash and wound.  Neurological: Positive HA, photophobia. Negative for dizziness, tremors, weakness and light-headedness.  Hematological: Negative for adenopathy. Does not bruise/bleed easily.  Psychiatric/Behavioral: Negative for behavioral problems, confusion, sleep disturbance, dysphoric mood, decreased concentration and agitation.      OBJECTIVE: Temp:  [97.6 F (36.4 C)-99.9 F (37.7 C)] 97.6 F (36.4 C) (02/22 1213) Pulse Rate:  [51-185] 51 (02/22 1213) Resp:  [17-20] 19 (02/22 1213) BP: (125-205)/(70-113) 146/96 (02/22 1213) SpO2:  [95 %-99 %] 97 % (02/22 1213) Weight:  [80 kg-80.8 kg] 80.8 kg (02/22 0141) Physical Exam  Constitutional: He is oriented to person, place, and time. He appears well-developed and well-nourished. No distress.  HENT:  Mouth/Throat: Oropharynx is clear and moist. +mild thrush on tongue and soft palate Cardiovascular: Normal rate, regular rhythm and normal heart sounds. Exam reveals no gallop and no friction rub.  No murmur heard.  Pulmonary/Chest: Effort normal and breath sounds normal. No respiratory distress. He has no wheezes.  Abdominal: Soft. Bowel sounds are normal. He exhibits no distension. There is no tenderness.  Lymphadenopathy:  He has no cervical adenopathy.  Neurological: He is alert and oriented to person, place, and time.  Skin: Skin is warm and dry. No rash noted. No erythema.  Psychiatric: He has a normal mood and affect. His behavior is normal.   LABS: Results for orders placed or performed during the hospital encounter of 03/26/20 (from the past 48 hour(s))  CBC with Differential     Status: Abnormal   Collection Time: 03/26/20  9:58 AM  Result Value Ref Range   WBC 3.7 (L) 4.0 - 10.5 K/uL   RBC 4.18 (L) 4.22 - 5.81 MIL/uL   Hemoglobin 13.9 13.0 - 17.0 g/dL   HCT 42.3 39.0 - 52.0 %   MCV 101.2 (H) 80.0 - 100.0 fL   MCH 33.3 26.0 - 34.0 pg   MCHC 32.9 30.0 - 36.0 g/dL   RDW 12.1 11.5 - 15.5 %   Platelets 325 150 - 400 K/uL   nRBC 0.0 0.0 - 0.2 %   Neutrophils Relative % 57 %   Neutro Abs 2.2 1.7 - 7.7 K/uL   Lymphocytes Relative 17 %   Lymphs Abs 0.6 (L) 0.7 - 4.0 K/uL   Monocytes Relative 19 %   Monocytes Absolute 0.7 0.1 - 1.0 K/uL   Eosinophils Relative 4 %   Eosinophils Absolute 0.2 0.0 - 0.5 K/uL   Basophils Relative 1 %   Basophils  Absolute 0.0 0.0 - 0.1 K/uL   Immature Granulocytes 2 %   Abs Immature Granulocytes 0.06 0.00 - 0.07 K/uL    Comment: Performed at [facility], [address], [place], [place] [address]  Comprehensive metabolic panel     Status: Abnormal   Collection Time: 03/26/20  9:58 AM  Result Value Ref Range   Sodium 135 135 - 145 mmol/L   Potassium 4.2 3.5 - 5.1 mmol/L   Chloride 101 98 - 111 mmol/L   CO2 25 22 - 32 mmol/L   Glucose, Bld 117 (H) 70 - 99 mg/dL    Comment: Glucose reference range applies only to samples taken after fasting for at least 8 hours.   BUN 12 8 - 23 mg/dL   Creatinine, Ser 1.16 0.61 - 1.24 mg/dL   Calcium 8.6 (L) 8.9 - 10.3 mg/dL   Total Protein 7.3 6.5 - 8.1 g/dL   Albumin 2.9 (  L) 3.5 - 5.0 g/dL   AST 27 15 - 41 U/L   ALT 16 0 - 44 U/L   Alkaline Phosphatase 104 38 - 126 U/L   Total Bilirubin 1.1 0.3 - 1.2 mg/dL   GFR, Estimated >60 >60 mL/min    Comment: (NOTE) Calculated using the CKD-EPI Creatinine Equation (2021)    Anion gap 9 5 - 15    Comment: Performed at [facility] [address] [place], [place] [address]  Lactic acid, plasma     Status: None   Collection Time: 03/26/20  9:58 AM  Result Value Ref Range   Lactic Acid, Venous 0.9 0.5 - 1.9 mmol/L    Comment: Performed at [facility] [address], [place], [place] [address]  Sedimentation rate     Status: Abnormal   Collection Time: 03/26/20  9:58 AM  Result Value Ref Range   Sed Rate 30 (H) 0 - 16 mm/hr    Comment: Performed at [facility] [address], [place], [place] [address]  HIV Antibody (routine testing w rflx)     Status: Abnormal   Collection Time: 03/26/20  9:58 AM  Result Value Ref Range   HIV Screen 4th Generation wRfx Reactive (A) Non Reactive    Comment: (NOTE) Reactive result does not distinguish HIV-1 p24 antigen, HIV-1  antibody, HIV-2 antibody, and HIV-1 group O antibody. Results  reactive by HIV Antigen/Antibody EIA must be confirmed. Sent  for  confirmation. Performed at [facility], [place] [address], [place], [place] [address]   CSF cell count with differential     Status: Abnormal   Collection Time: 03/26/20  5:00 PM  Result Value Ref Range   Tube # 3    Color, CSF COLORLESS COLORLESS   Appearance, CSF CLEAR (A) CLEAR   Supernatant NOT INDICATED    RBC Count, CSF 450 (H) 0 /cu mm   WBC, CSF 17 (HH) 0 - 5 /cu mm    Comment: CRITICAL RESULT CALLED TO, READ BACK BY AND VERIFIED WITH: H.ALLONIER,RN @1833  03/26/2020 [email or web address]    Segmented Neutrophils-CSF 10 (H) 0 - 6 %   Lymphs, CSF 42 40 - 80 %   Monocyte-Macrophage-Spinal Fluid 48 (H) 15 - 45 %   Eosinophils, CSF 0 0 - 1 %   Other Cells, CSF SEE MICROBIOLOGY     Comment: Performed at [facility] [address], [place], [place] [address]  Protein and glucose, CSF     Status: Abnormal   Collection Time: 03/26/20  5:00 PM  Result Value Ref Range   Glucose, CSF 37 (L) 40 - 70 mg/dL   Total  Protein, CSF 140 (H) 15 - 45 mg/dL    Comment: Performed at [facility] [address] [place], [place] [address]  Cryptococcal antigen, CSF     Status: Abnormal   Collection Time: 03/26/20  5:00 PM  Result Value Ref Range   Crypto Ag POSITIVE (A) NEGATIVE    Comment: CRITICAL RESULT CALLED TO, READ BACK BY AND VERIFIED WITH: Cecilie Kicks RN [id number] 03/26/20 A BROWNING    Cryptococcal Ag Titer 2,560 (A) NOT INDICATED    Comment: Performed at [facility] [address] [place], [place] [address]  CSF culture     Status: None (Preliminary result)   Collection Time: 03/26/20  5:05 PM   Specimen: CSF; Cerebrospinal Fluid  Result Value Ref Range   Specimen Description  CSF    Special Requests NONE    Gram Stain      WBC PRESENT,BOTH PMN AND MONONUCLEAR CRYPTOCOCCUS NEOFORMANS CYTOSPIN SMEAR    Culture      NO GROWTH < 24 HOURS Performed at [facility], [address] [place], [place] [address]    Report Status PENDING   Magnesium     Status:  None   Collection Time: 03/26/20  6:29 PM  Result Value Ref Range   Magnesium 1.8 1.7 - 2.4 mg/dL    Comment: Performed at [facility], [place] [address] [place], [place] [address]  Phosphorus     Status: None   Collection Time: 03/26/20  6:29 PM  Result Value Ref Range   Phosphorus 2.9 2.5 - 4.6 mg/dL    Comment: Performed at [facility] [address] [place], [place] [address]  Cd4/cd8 (t-helper/t-suppressor cell)     Status: Abnormal   Collection Time: 03/26/20  7:01 PM  Result Value Ref Range   Total lymphocyte count 857 (L) 1,000 - 4,000 /uL   CD4% 2 (L) 33 - 65 %   CD4 absolute <35 (L) 400 - 1,790 /uL   CD8tox 56 (H) 12 - 40 %   CD8 T Cell Abs 482 190 - 1,000 /uL   Ratio NOT CALCULATED 1.0 - 3.0    Comment: Performed at [facility], [place] [address] [place], [place] [address]  Magnesium     Status: None   Collection Time: 03/27/20  3:36 AM  Result Value Ref Range   Magnesium 2.1 1.7 - 2.4 mg/dL    Comment: Performed at [facility], [place] [address], [place],  [address]  Comprehensive metabolic panel     Status: Abnormal   Collection Time: 03/27/20  3:36 AM  Result Value Ref Range   Sodium 131 (L) 135 - 145 mmol/L   Potassium 4.0 3.5 - 5.1 mmol/L   Chloride 98 98 - 111 mmol/L   CO2 20 (L) 22 - 32 mmol/L   Glucose, Bld 234 (H) 70 - 99 mg/dL    Comment: Glucose reference range applies only to samples taken after fasting for at least 8 hours.   BUN 10 8 - 23 mg/dL   Creatinine, Ser 1.09 0.61 - 1.24 mg/dL   Calcium 8.6 (L) 8.9 - 10.3 mg/dL   Total Protein 7.6 6.5 - 8.1 g/dL   Albumin 3.0 (L) 3.5 - 5.0 g/dL   AST 30 15 - 41 U/L   ALT 18 0 - 44 U/L   Alkaline Phosphatase 95 38 - 126 U/L   Total Bilirubin 1.0 0.3 - 1.2 mg/dL   GFR, Estimated >60 >60 mL/min    Comment: (NOTE) Calculated using the CKD-EPI Creatinine Equation (2021)    Anion gap 13 5 - 15    Comment: Performed at [facility], [place] [address]  [place], [place] [address]  CBC     Status: None   Collection Time: 03/27/20  3:36 AM  Result Value Ref Range   WBC 5.4 4.0 - 10.5 K/uL   RBC 4.25 4.22 - 5.81 MIL/uL   Hemoglobin 14.3 13.0 - 17.0 g/dL   HCT 42.4 39.0 - 52.0 %   MCV 99.8 80.0 - 100.0 fL   MCH 33.6 26.0 - 34.0 pg   MCHC 33.7 30.0 - 36.0 g/dL   RDW 11.8 11.5 - 15.5 %   Platelets 324 150 - 400 K/uL  nRBC 0.0 0.0 - 0.2 %    Comment: Performed at [facility], [place] [address] [place], [place] [address]  TSH     Status: None   Collection Time: 03/27/20  9:49 AM  Result Value Ref Range   TSH 0.628 0.350 - 4.500 uIU/mL    Comment: Performed by a 3rd Generation assay with a functional sensitivity of <=0.01 uIU/mL. Performed at [facility], [place] [address], [place], [place] [address]   T4, free     Status: None   Collection Time: 03/27/20  9:49 AM  Result Value Ref Range   Free T4 0.75 0.61 - 1.12 ng/dL    Comment: (NOTE) Biotin ingestion may interfere with free T4 tests. If the results are inconsistent with the TSH level, previous test results, or the clinical presentation, then consider biotin interference. If needed, order repeat testing after stopping biotin. Performed at [facility], [place] [address] [place], [place] [address]     MICRO: reviewed IMAGING: MR Brain W and Wo Contrast  Result Date: 03/26/2020 CLINICAL DATA:  Neuro deficit, acute stroke suspected. Vision changes, headache. EXAM: MRI HEAD WITHOUT AND WITH CONTRAST TECHNIQUE: Multiplanar, multiecho pulse sequences of the brain and surrounding structures were obtained without and with intravenous contrast. CONTRAST:  32mL GADAVIST GADOBUTROL 1 MMOL/ML IV SOLN COMPARISON:  Same day CTV. FINDINGS: Motion limited study. Brain: No acute infarction, hemorrhage, hydrocephalus, extra-axial collection or mass lesion. Vascular: Major arterial flow voids are maintained at the skull base. The area of abnormality seen on same-day CTV is poorly  characterized on this study secondary to motion and flow related artifacts. Skull and upper cervical spine: Normal marrow signal. Sinuses/Orbits: Mild ethmoid air cell mucosal thickening without air-fluid levels. Unremarkable orbits. Other: No mastoid effusions. IMPRESSION: 1. No evidence of acute nonvascular abnormality. 2. The area of abnormality seen on same-day CTV is poorly characterized on this study secondary to motion and flow related artifact. While the appearance on the CTV was atypical in appearance/morphology for acute thrombus, a follow up CTV in approximately one week could evaluate for change if the patient's symptoms do not improve or worsen. Electronically Signed   By: Margaretha Sheffield MD   On: 03/26/2020 14:48   CT VENOGRAM HEAD  Result Date: 03/26/2020 CLINICAL DATA:  Dural venous sinus thrombosis suspected. Headache. On antibiotics for sinusitis. EXAM: CT VENOGRAM HEAD TECHNIQUE: Contiguous axial images were obtained from the base of the skull through the vertex during the bolus administration of intravenous contrast using CTV timing. Multiplanar CT image reconstructions and MIPs were obtained to evaluate the venous anatomy. CONTRAST:  3mL OMNIPAQUE IOHEXOL 350 MG/ML SOLN COMPARISON:  None. FINDINGS: Brain: No evidence of acute large vascular territory infarct, acute hemorrhage, hydrocephalus, extra-axial fluid collection or mass lesion. Mild patchy white matter hypoattenuation is nonspecific but most likely related to chronic microvascular ischemic disease. Vascular: Calcific atherosclerosis. Major arteries are grossly patent where visualized. Small focal hypodensity along the superior aspect of the superior sagittal sinus, possibly extradural in location (see series 10, image 134 and series 5, image 67). Otherwise, dural sinuses are patent. Small left transverse sinus. Skull: No acute fracture. Sinuses/orbits: Scattered ethmoid air cell mucosal thickening without air-fluid level.  Unremarkable orbits. Other: No mastoid effusions. IMPRESSION: 1. Small focal hypodensity along the superior aspect of the superior sagittal sinus, possibly extradural in location and not typical in appearance/morphology for acute thrombus. Per Dr. Sherry Ruffing an MRI with and without contrast has already been ordered,  which will further evaluate. 2. Ethmoid air cell mucosal thickening without air-fluid levels. Findings discussed with Dr. Sherry Ruffing via telephone at 12:36 PM. Electronically Signed   By: Margaretha Sheffield MD   On: 03/26/2020 12:42   Assessment/Plan:  65yo M with advanced HIV disease, CD 4 count <35, off of antiviral therapy admitted for HA found to be consistent with cryptococcal meningitis  - continue with induction therapy with L-amphotericin plus flucytosine x 14 days. We will watch for electrolyte abn, replete potassium wasting. - will check HIV genotype to see if can tailor his antiviral regimen once ready to start. Still likely to be PI based regimen - awaiting VDRL from CSF to see if component of neurosyphilis  hiv disease = will need bactrim ss daily plus azithromycin 1200mg  IV weekly - will check other annual HIV labs including RPR, Hep C ab  LUL pneumonia = per my read, it appears cavitary. Will get sputum culture. Recommend to get chest CT for better evaluation.   Past med hx = will try to get more information when he was treated for mTB  Thrush = currently on antifungals which will treat it

## 2020-03-27 NOTE — Progress Notes (Signed)
  Echocardiogram 2D Echocardiogram has been performed.  Ricky [name] 03/27/2020, 3:39 PM

## 2020-03-28 DIAGNOSIS — E8809 Other disorders of plasma-protein metabolism, not elsewhere classified: Secondary | ICD-10-CM | POA: Diagnosis not present

## 2020-03-28 DIAGNOSIS — I471 Supraventricular tachycardia, unspecified: Secondary | ICD-10-CM

## 2020-03-28 DIAGNOSIS — B20 Human immunodeficiency virus [HIV] disease: Secondary | ICD-10-CM | POA: Diagnosis not present

## 2020-03-28 DIAGNOSIS — I4892 Unspecified atrial flutter: Secondary | ICD-10-CM | POA: Diagnosis not present

## 2020-03-28 DIAGNOSIS — R9431 Abnormal electrocardiogram [ECG] [EKG]: Secondary | ICD-10-CM | POA: Insufficient documentation

## 2020-03-28 DIAGNOSIS — I10 Essential (primary) hypertension: Secondary | ICD-10-CM | POA: Diagnosis not present

## 2020-03-28 LAB — COMPREHENSIVE METABOLIC PANEL
ALT: 16 U/L (ref 0–44)
AST: 25 U/L (ref 15–41)
Albumin: 2.9 g/dL — ABNORMAL LOW (ref 3.5–5.0)
Alkaline Phosphatase: 92 U/L (ref 38–126)
Anion gap: 9 (ref 5–15)
BUN: 13 mg/dL (ref 8–23)
CO2: 25 mmol/L (ref 22–32)
Calcium: 8.6 mg/dL — ABNORMAL LOW (ref 8.9–10.3)
Chloride: 99 mmol/L (ref 98–111)
Creatinine, Ser: 0.96 mg/dL (ref 0.61–1.24)
GFR, Estimated: >60 mL/min (ref >60)
Glucose, Bld: 115 mg/dL — ABNORMAL HIGH (ref 70–99)
Potassium: 4.2 mmol/L (ref 3.5–5.1)
Sodium: 133 mmol/L — ABNORMAL LOW (ref 135–145)
Total Bilirubin: 0.9 mg/dL (ref 0.3–1.2)
Total Protein: 7.1 g/dL (ref 6.5–8.1)

## 2020-03-28 LAB — RAPID URINE DRUG SCREEN, HOSP PERFORMED
Amphetamines: NOT DETECTED
Barbiturates: NOT DETECTED
Benzodiazepines: NOT DETECTED
Cocaine: NOT DETECTED
Opiates: POSITIVE — AB
Tetrahydrocannabinol: NOT DETECTED

## 2020-03-28 LAB — HIV-1 RNA QUANT-NO REFLEX-BLD
HIV 1 RNA Quant: 35500 copies/mL
HIV 1 RNA Quant: 25300 copies/mL
LOG10 HIV-1 RNA: 4.55 log10copy/mL
LOG10 HIV-1 RNA: 4.403 log10copy/mL

## 2020-03-28 LAB — EXPECTORATED SPUTUM ASSESSMENT W GRAM STAIN, RFLX TO RESP C

## 2020-03-28 LAB — HSV 1/2 PCR, CSF
HSV-1 DNA: NEGATIVE
HSV-2 DNA: NEGATIVE

## 2020-03-28 LAB — MAGNESIUM: Magnesium: 1.9 mg/dL (ref 1.7–2.4)

## 2020-03-28 LAB — PATHOLOGIST SMEAR REVIEW

## 2020-03-28 LAB — VDRL, CSF: VDRL Quant, CSF: NONREACTIVE

## 2020-03-28 MED ORDER — MAGNESIUM SULFATE IN D5W 1-5 GM/100ML-% IV SOLN
1.0000 g | Freq: Once | INTRAVENOUS | Status: DC
  Filled 2020-03-28: qty 100

## 2020-03-28 MED ORDER — METOPROLOL TARTRATE 25 MG PO TABS
25.0000 mg | ORAL_TABLET | Freq: Two times a day (BID) | ORAL | Status: DC
  Administered 2020-03-28: 25 mg via ORAL
  Filled 2020-03-28: qty 1

## 2020-03-28 MED ORDER — SODIUM CHLORIDE 3 % IN NEBU
4.0000 mL | INHALATION_SOLUTION | Freq: Once | RESPIRATORY_TRACT | Status: AC | PRN
  Administered 2020-03-28: 4 mL via RESPIRATORY_TRACT
  Filled 2020-03-28: qty 4

## 2020-03-28 MED ORDER — AZITHROMYCIN 600 MG PO TABS
1200.0000 mg | ORAL_TABLET | ORAL | Status: DC
  Administered 2020-03-28 – 2020-04-04 (×2): 1200 mg via ORAL
  Filled 2020-03-28 (×3): qty 2

## 2020-03-28 MED ORDER — MAGNESIUM SULFATE 2 GM/50ML IV SOLN
2.0000 g | Freq: Once | INTRAVENOUS | Status: AC
  Administered 2020-03-28: 2 g via INTRAVENOUS

## 2020-03-28 MED ORDER — DILTIAZEM HCL 30 MG PO TABS
30.0000 mg | ORAL_TABLET | Freq: Three times a day (TID) | ORAL | Status: DC
  Administered 2020-03-28 – 2020-04-07 (×26): 30 mg via ORAL
  Filled 2020-03-28 (×29): qty 1

## 2020-03-28 MED ORDER — SULFAMETHOXAZOLE-TRIMETHOPRIM 400-80 MG PO TABS
1.0000 | ORAL_TABLET | Freq: Every day | ORAL | Status: DC
  Administered 2020-03-28 – 2020-04-02 (×6): 1 via ORAL
  Filled 2020-03-28 (×6): qty 1

## 2020-03-28 NOTE — Progress Notes (Signed)
[name]    Date of Admission:  03/26/2020   Total days of antibiotics 3 of 14 induction          ID: Ricky [name] is a 65 y.o. 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 Has mild productive cough. 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 azithromycin  1,200 mg Oral Weekly  . dextrose  10 mL Intravenous Q24H  . dextrose  10 mL Intravenous Q24H  . flucytosine  25 mg/kg Oral Q6H  . metoprolol tartrate  25 mg Oral BID  . sodium chloride  500 mL Intravenous Q24H  . sodium chloride  500 mL Intravenous Q24H  . sulfamethoxazole-trimethoprim  1 tablet Oral Daily    Objective: Vital signs in last 24 hours: Temp:  [97.6 F (36.4 C)-98.8 F (37.1 C)] 98.8 F (37.1 C) (02/23 0712) Pulse Rate:  [51-180] 58 (02/23 0712) Resp:  [17-19] 18 (02/23 0712) BP: (111-151)/(70-99) 151/97 (02/23 0712) SpO2:  [94 %-100 %] 97 % (02/23 2376) Physical Exam  Constitutional: He is oriented to person, place, and time. He appears well-developed and well-nourished. No distress.  HENT:  Mouth/Throat: Oropharynx is clear and moist. No oropharyngeal exudate.  Cardiovascular: Normal rate, regular rhythm and normal heart sounds. Exam reveals no gallop and no friction rub.  No murmur heard.  Pulmonary/Chest: Effort normal and breath sounds normal. No respiratory distress. He has no wheezes.  Abdominal:  Soft. Bowel sounds are normal. He exhibits no distension. There is no tenderness.  Lymphadenopathy:  He has no cervical adenopathy.  Neurological: He is alert and oriented to person, place, and time.  Skin: Skin is warm and dry. No rash noted. No erythema.  Psychiatric: He has a normal mood and affect. His behavior is normal.     Lab Results Recent Labs    03/26/20 0958 03/27/20 0336 03/28/20 0428  WBC 3.7* 5.4  --   HGB 13.9 14.3  --   HCT 42.3 42.4  --   NA 135 131* 133*  K 4.2 4.0 4.2  CL 101 98 99  CO2 25 20* 25  BUN _0 CREATININE 1.16 1.09 0.96   Liver Panel Recent Labs    03/27/20 0336 03/28/20 0428  PROT 7.6 7.1  ALBUMIN 3.0* 2.9*  AST 30 25  ALT 18 16  ALKPHOS 95 92  BILITOT 1.0 0.9   Sedimentation Rate Recent Labs    03/26/20 0958  ESRSEDRATE 30*   C-Reactive Protein No results for input(s): CRP in the last 72 hours.  Microbiology: reviewed Studies/Results: CT CHEST W CONTRAST  Result Date: 03/28/2020 CLINICAL DATA:  Persistent cough. EXAM: CT CHEST WITH CONTRAST TECHNIQUE: Multidetector CT imaging of the chest was performed during intravenous contrast administration. CONTRAST:  74m OMNIPAQUE IOHEXOL 300 MG/ML  SOLN COMPARISON:  None. FINDINGS: Cardiovascular: Atherosclerotic calcification of the aorta. Heart is at the upper limits of normal to mildly  enlarged with left ventricular dilatation. No pericardial effusion. Mediastinum/Nodes: Mediastinal lymph nodes measure up to 12 mm in the low left paratracheal station. Hilar lymph nodes measure up to 11 mm on the left. Subcarinal lymph nodes measure up to 1.7 cm. No axillary adenopathy. Periesophageal lymph nodes measure up to 12 mm (3/62). There may be slight distal esophageal wall thickening. Lungs/Pleura: Centrilobular and paraseptal emphysema. Nodular and cavitary areas of airspace consolidation in the left upper and left lower lobes with areas of bronchiectasis and volume loss. Minimal dependent  atelectasis in the right lower lobe. No pleural fluid. Airway is otherwise unremarkable. Upper Abdomen: Visualized portions of the liver and adrenal glands are unremarkable. Scattered scarring in the kidneys. Subcentimeter low-attenuation lesions in the kidneys are too small to characterize. Spleen is unremarkable. Chronic calcific pancreatitis. There may be a small hiatal hernia. Visualized portions of the stomach and bowel are otherwise grossly unremarkable. Upper abdominal lymph nodes measure up to 12 mm in the periportal region. Musculoskeletal: No worrisome lytic or sclerotic lesions. Disc calcification is seen at T10-11. IMPRESSION: 1. Nodular and cavitary airspace consolidation in the left upper and left lower lobes, [name], possibly reactive. Again, [name], as clinically indicated. 4. Areas of volume loss and bronchiectasis in the left upper and left lower lobes may be due to resolving infection/post infectious scarring. 5. Chronic calcific pancreatitis. 6.  Aortic atherosclerosis (ICD10-I70.0). 7.  Emphysema (ICD10-J43.9). Electronically Signed   By: Lorin Picket M.D.   On: 03/28/2020 08:22   MR Brain W and Wo Contrast  Result Date: 03/26/2020 CLINICAL DATA:  Neuro deficit, acute stroke suspected. Vision changes, headache. EXAM: MRI HEAD WITHOUT AND WITH CONTRAST TECHNIQUE: Multiplanar, multiecho pulse sequences of the brain and surrounding structures were obtained without and with intravenous contrast. CONTRAST:  37m GADAVIST GADOBUTROL 1 MMOL/ML IV SOLN COMPARISON:  Same day CTV. FINDINGS: Motion limited study. Brain: No acute infarction, hemorrhage, hydrocephalus, extra-axial collection or mass lesion. Vascular: Major arterial  flow voids are maintained at the skull base. The area of abnormality seen on same-day CTV is poorly characterized on this study secondary to motion and flow related artifacts. Skull and upper cervical spine: Normal marrow signal. Sinuses/Orbits: Mild ethmoid air cell mucosal thickening without air-fluid levels. Unremarkable orbits. Other: No mastoid effusions. IMPRESSION: 1. No evidence of acute nonvascular abnormality. 2. The area of abnormality seen on same-day CTV is poorly characterized on this study secondary to motion and flow related artifact. While the appearance on the CTV was atypical in appearance/morphology for acute thrombus, a follow up CTV in approximately one week could evaluate for change if the patient's symptoms do not improve or worsen. Electronically Signed   By: FMargaretha SheffieldMD   On: 03/26/2020 14:48   ECHOCARDIOGRAM COMPLETE  Result Date: 03/27/2020    ECHOCARDIOGRAM REPORT   Patient Name:   Ricky ALLSTONDate of Exam: 03/27/2020 Medical Rec #:  [id number]     Height:       73.0 in Accession #:    [id number]    Weight:       178.1 lb Date of Birth:  105-31-1957    BSA:          2.048 m Patient Age:    89years  BP:           142/70 mmHg Patient Gender: M              HR:           56 bpm. Exam Location:  Inpatient Procedure: 2D Echo, Cardiac Doppler and Color Doppler Indications:    Atrial flutter  History:        Patient has no prior history of Echocardiogram examinations.                 Arrythmias:Atrial Flutter; Risk Factors:Hypertension. HIV+.  Sonographer:    Dustin Flock Referring Phys: [id number] [place]  1. Left ventricular ejection fraction, by estimation, is 50 to 55%. The left ventricle has low normal function. The left ventricle has no regional wall motion abnormalities. There is mild concentric left ventricular hypertrophy. Left ventricular diastolic parameters are indeterminate.  2. Right ventricular systolic function is normal. The right  ventricular size is normal.  3. Left atrial size was mildly dilated.  4. Right atrial size was mildly dilated.  5. The mitral valve is normal in structure. Trivial mitral valve regurgitation. No evidence of mitral stenosis.  6. The aortic valve is tricuspid. Aortic valve regurgitation is not visualized. No aortic stenosis is present.  7. The inferior vena cava is normal in size with greater than 50% respiratory variability, suggesting right atrial pressure of 3 mmHg. Comparison(s): No prior Echocardiogram. Conclusion(s)/Recommendation(s): Normal biventricular function without evidence of hemodynamically significant valvular heart disease. FINDINGS  Left Ventricle: Left ventricular ejection fraction, by estimation, is 50 to 55%. The left ventricle has low normal function. The left ventricle has no regional wall motion abnormalities. The left ventricular internal cavity size was normal in size. There is mild concentric left ventricular hypertrophy. Left ventricular diastolic parameters are indeterminate. Right Ventricle: The right ventricular size is normal. Right vetricular wall thickness was not well visualized. Right ventricular systolic function is normal. Left Atrium: Left atrial size was mildly dilated. Right Atrium: Right atrial size was mildly dilated. Pericardium: There is no evidence of pericardial effusion. Mitral Valve: The mitral valve is normal in structure. Trivial mitral valve regurgitation. No evidence of mitral valve stenosis. Tricuspid Valve: The tricuspid valve is normal in structure. Tricuspid valve regurgitation is not demonstrated. No evidence of tricuspid stenosis. Aortic Valve: The aortic valve is tricuspid. Aortic valve regurgitation is not visualized. No aortic stenosis is present. Pulmonic Valve: The pulmonic valve was grossly normal. Pulmonic valve regurgitation is not visualized. No evidence of pulmonic stenosis. Aorta: The aortic root, ascending aorta and aortic arch are all structurally  normal, with no evidence of dilitation or obstruction. Venous: The inferior vena cava is normal in size with greater than 50% respiratory variability, suggesting right atrial pressure of 3 mmHg. IAS/Shunts: The atrial septum is grossly normal.  LEFT VENTRICLE PLAX 2D LVIDd:         5.00 cm  Diastology LVIDs:         3.80 cm  LV e' medial:    5.77 cm/s LV PW:         1.20 cm  LV E/e' medial:  14.8 LV IVS:        1.30 cm  LV e' lateral:   7.51 cm/s LVOT diam:     2.70 cm  LV E/e' lateral: 11.4 LV SV:         121 LV SV Index:   59 LVOT Area:     5.73 cm  RIGHT VENTRICLE  RV Basal diam:  3.00 cm RV S prime:     13.20 cm/s TAPSE (M-mode): 2.6 cm LEFT ATRIUM             Index       RIGHT ATRIUM           Index LA diam:        3.20 cm 1.56 cm/m  RA Area:     15.60 cm LA Vol (A2C):   42.5 ml 20.75 ml/m RA Volume:   39.30 ml  19.19 ml/m LA Vol (A4C):   44.2 ml 21.58 ml/m LA Biplane Vol: 44.9 ml 21.92 ml/m  AORTIC VALVE LVOT Vmax:   99.80 cm/s LVOT Vmean:  67.700 cm/s LVOT VTI:    0.211 m  AORTA Ao Root diam: 3.20 cm MITRAL VALVE MV Area (PHT): 3.72 cm    SHUNTS MV Decel Time: 204 msec    Systemic VTI:  0.21 m MV E velocity: 85.30 cm/s  Systemic Diam: 2.70 cm MV A velocity: 70.70 cm/s MV E/A ratio:  1.21 Buford Dresser MD Electronically signed by Buford Dresser MD Signature Date/Time: 03/27/2020/7:28:47 PM    Final    CT VENOGRAM HEAD  Result Date: 03/26/2020 CLINICAL DATA:  Dural venous sinus thrombosis suspected. Headache. On antibiotics for sinusitis. EXAM: CT VENOGRAM HEAD TECHNIQUE: Contiguous axial images were obtained from the base of the skull through the vertex during the bolus administration of intravenous contrast using CTV timing. Multiplanar CT image reconstructions and MIPs were obtained to evaluate the venous anatomy. CONTRAST:  65m OMNIPAQUE IOHEXOL 350 MG/ML SOLN COMPARISON:  None. FINDINGS: Brain: No evidence of acute large vascular territory infarct, acute hemorrhage, hydrocephalus,  extra-axial fluid collection or mass lesion. Mild patchy white matter hypoattenuation is nonspecific but most likely related to chronic microvascular ischemic disease. Vascular: Calcific atherosclerosis. Major arteries are grossly patent where visualized. Small focal hypodensity along the superior aspect of the superior sagittal sinus, possibly extradural in location (see series 10, image 134 and series 5, image 67). Otherwise, dural sinuses are patent. Small left transverse sinus. Skull: No acute fracture. Sinuses/orbits: Scattered ethmoid air cell mucosal thickening without air-fluid level. Unremarkable orbits. Other: No mastoid effusions. IMPRESSION: 1. Small focal hypodensity along the superior aspect of the superior sagittal sinus, possibly extradural in location and not typical in appearance/morphology for acute thrombus. Per Dr. TSherry Ruffingan MRI with and without contrast has already been ordered, which will further evaluate. 2. Ethmoid air cell mucosal thickening without air-fluid levels. Findings discussed with Dr. TSherry Ruffingvia telephone at 12:36 PM. 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 Recommend to see if RT can do an induced sputum x 2-3 times. He is at risk for non-tuberculous mycobacterial infection such as MAC given low CD 4 count, also could have disseminated cryptococcal disease, will check fungal culture to see if has cryptococcal lung disease.  Will check with health dept if they have record of his mTB treatment. No need for airborne isolation at this time.  CM = currently on 3rd day of 14 for induction with  L-ampho and flucytosine. Pharmacy is monitoring electrolyte repletion.   [facility] Cell: [phone] Pager: [phone]  03/28/2020, 11:17 AM

## 2020-03-28 NOTE — Progress Notes (Signed)
PROGRESS NOTE    Ricky [name]   YCX:[id number]  DOB: [birth year]  DOA: 03/26/2020 PCP: Merryl Hacker, No   Brief Narrative:  Ricky [name] is a 65 year old male with HIV who presented to the hospital with multiple complaints including dizziness, blurred vision, sinus pressure and photophobia.  To above-mentioned symptoms and exam findings, there was a suspicion that the patient may have meningitis and thus an LP was performed in the ED. 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 These findings specifically included a positive cryptococcal antigen and a cryptococcal antigen titer of 2560.  Opening pressure was 27 and closing pressure was 21.  6 cc of CSF was removed. Patient was started on amphotericin B, methylprednisolone and flucytosine in the ED. Also, in the ED, he was found to have a flutter with RVR and started on a Cardizem infusion. He was leukopenic with a WBC count of 3.7.  Subjective: He has a mild right sided headache and is still coughing. When he coughs, it causes pain in his upper back.     Assessment & Plan:   Principal Problem:   Cryptococcal meningitis  -Continue antifungal treatments per ID -I have ordered neuro checks every 4 hours to assess if his symptoms recur as he may need further drainage of CSF  Active Problems:    SVT -He believes he has a prior history of this and thinks he was on medication for it -TSH checked and is within normal limits -2D echo has been ordered and results reveal a normal EF, undetermined diastolic parameters and dilatation of both atria - Have not ordered any anticoagulation as of yet - CHA2DS2-VASc Score is 1 - As he had another episode of SVT on 2/22, I will start Lopressor BID and follow for further episodes - I will also consult cardiology for an opinion on anticoagulation    Hypertension - BP elevated-  Using Lopressor for SVT and HTN  LUL infiltrate - he states he has a chronic cough and sputums is sometimes  clear and sometimes colored -  CT chest > cavitary airspace consolidation in the left upper and left lower lobes, findings most indicative of an atypical/fungal infectious process. - sputum culture pending - add Delsym for cough supression    Moderate protein malnutrition,  Hypoalbuminemia -Continue dietary supplements Body mass index is 23.5 kg/m.    AIDS -Patient states that he was previously on medications but it has been over a year since he has been on them-at this time he does not have a PCP and does not follow-up with an ID physician - appreciate ID assistance   CD4 absolute   <35     Total lymphocyte count   857    CD4%   2    CD8tox   56    CD8 T Cell Abs   482    Ratio   NOT CA...          Time spent in minutes: 35 DVT prophylaxis: SCDs Start: 03/26/20 1946  Code Status: stable Family Communication:  Level of Care: Level of care: Progressive Disposition Plan:  Status is: Inpatient  Remains inpatient appropriate because:IV treatments appropriate due to intensity of illness or inability to take PO   Dispo: The patient is from: Home              Anticipated d/c is to: Home              Anticipated d/c date is: 3 days  Patient currently is not medically stable to d/c.   Difficult to place patient No      Consultants:   ID  Neurology Procedures:   LP Antimicrobials:  Anti-infectives (From admission, onward)   Start     Dose/Rate Route Frequency Ordered Stop   03/28/20 1000  azithromycin (ZITHROMAX) tablet 1,200 mg        1,200 mg Oral Weekly 03/28/20 0912     03/28/20 1000  sulfamethoxazole-trimethoprim (BACTRIM) 400-80 MG per tablet 1 tablet        1 tablet Oral Daily 03/28/20 0912     03/27/20 2115  amphotericin B liposome (AMBISOME) 300 mg in dextrose 5 % 500 mL IVPB  Status:  Discontinued        300 mg 287.5 mL/hr over 120 Minutes Intravenous Every 24 hours 03/26/20 2024 03/26/20 2227   03/26/20 2227  amphotericin B liposome  (AMBISOME) 300 mg in dextrose 5 % 500 mL IVPB        300 mg 287.5 mL/hr over 120 Minutes Intravenous Every 24 hours 03/26/20 2227     03/26/20 1945  amphotericin B liposome (AMBISOME) 300 mg in dextrose 5 % 500 mL IVPB  Status:  Discontinued        300 mg 287.5 mL/hr over 120 Minutes Intravenous Every 24 hours 03/26/20 1850 03/26/20 2024   03/26/20 1900  flucytosine (ANCOBON) capsule 2,000 mg        25 mg/kg  80 kg Oral Every 6 hours 03/26/20 1850         Objective: Vitals:   03/27/20 2258 03/28/20 0301 03/28/20 0335 03/28/20 0712  BP: (!) 139/99 (!) 151/98 (!) 146/95 (!) 151/97  Pulse: 63 61 (!) 57 (!) 58  Resp: 18 18 17 18   Temp: 98.7 F (37.1 C) 98 F (36.7 C) 97.8 F (36.6 C) 98.8 F (37.1 C)  TempSrc: Oral Oral Oral Oral  SpO2: 94% 98% 97% 97%  Weight:      Height:        Intake/Output Summary (Last 24 hours) at 03/28/2020 1017 Last data filed at 03/28/2020 8563 Gross per 24 hour  Intake 810.38 ml  Output 4000 ml  Net -3189.62 ml   Filed Weights   03/26/20 1839 03/27/20 0141  Weight: 80 kg 80.8 kg    Examination: General exam: Appears comfortable  HEENT: PERRLA, oral mucosa moist, no sclera icterus or thrush Respiratory system: Clear to auscultation. Respiratory effort normal. Cardiovascular system: S1 & S2 heard, RRR.   Gastrointestinal system: Abdomen soft, non-tender, nondistended. Normal bowel sounds. Central nervous system: Alert and oriented. No focal neurological deficits. Extremities: No cyanosis, clubbing or edema Skin: No rashes or ulcers Psychiatry:  Mood & affect appropriate.     Data Reviewed: I have personally reviewed following labs and imaging studies  CBC: Recent Labs  Lab 03/26/20 0958 03/27/20 0336  WBC 3.7* 5.4  NEUTROABS 2.2  --   HGB 13.9 14.3  HCT 42.3 42.4  MCV 101.2* 99.8  PLT 325 149   Basic Metabolic Panel: Recent Labs  Lab 03/26/20 0958 03/26/20 1829 03/27/20 0336 03/28/20 0428  NA 135  --  131* 133*  K 4.2   --  4.0 4.2  CL 101  --  98 99  CO2 25  --  20* 25  GLUCOSE 117*  --  234* 115*  BUN 12  --  10 13  CREATININE 1.16  --  1.09 0.96  CALCIUM 8.6*  --  8.6* 8.6*  MG  --  1.8 2.1 1.9  PHOS  --  2.9  --   --    GFR: Estimated Creatinine Clearance: 90.2 mL/min (by C-G formula based on SCr of 0.96 mg/dL). Liver Function Tests: Recent Labs  Lab 03/26/20 0958 03/27/20 0336 03/28/20 0428  AST 27 30 25   ALT 16 18 16   ALKPHOS 104 95 92  BILITOT 1.1 1.0 0.9  PROT 7.3 7.6 7.1  ALBUMIN 2.9* 3.0* 2.9*   No results for input(s): LIPASE, AMYLASE in the last 168 hours. No results for input(s): AMMONIA in the last 168 hours. Coagulation Profile: No results for input(s): INR, PROTIME in the last 168 hours. Cardiac Enzymes: No results for input(s): CKTOTAL, CKMB, CKMBINDEX, TROPONINI in the last 168 hours. BNP (last 3 results) No results for input(s): PROBNP in the last 8760 hours. HbA1C: No results for input(s): HGBA1C in the last 72 hours. CBG: No results for input(s): GLUCAP in the last 168 hours. Lipid Profile: No results for input(s): CHOL, HDL, LDLCALC, TRIG, CHOLHDL, LDLDIRECT in the last 72 hours. Thyroid Function Tests: Recent Labs    03/27/20 0949  TSH 0.628  FREET4 0.75   Anemia Panel: No results for input(s): VITAMINB12, FOLATE, FERRITIN, TIBC, IRON, RETICCTPCT in the last 72 hours. Urine analysis: No results found for: COLORURINE, APPEARANCEUR, LABSPEC, PHURINE, GLUCOSEU, HGBUR, BILIRUBINUR, KETONESUR, PROTEINUR, UROBILINOGEN, NITRITE, LEUKOCYTESUR Sepsis Labs: @LABRCNTIP (procalcitonin:4,lacticidven:4) ) Recent Results (from the past 240 hour(s))  CSF culture     Status: None (Preliminary result)   Collection Time: 03/26/20  5:05 PM   Specimen: CSF; Cerebrospinal Fluid  Result Value Ref Range Status   Specimen Description CSF  Final   Special Requests NONE  Final   Gram Stain   Final    WBC PRESENT,BOTH PMN AND MONONUCLEAR CRYPTOCOCCUS NEOFORMANS CYTOSPIN  SMEAR    Culture   Final    CULTURE REINCUBATED FOR BETTER GROWTH Performed at [facility], [place] [address] [place], [place] [address]    Report Status PENDING  Incomplete  Expectorated Sputum Assessment w Gram Stain, Rflx to Resp Cult     Status: None   Collection Time: 03/27/20  8:23 PM   Specimen: Sputum  Result Value Ref Range Status   Specimen Description SPUTUM  Final   Special Requests NONE  Final   Sputum evaluation   Final    Sputum specimen not acceptable for testing.  Please recollect.   RESULT CALLED TO, READ BACK BY AND VERIFIED WITH: Radene Knee RN 03/27/20 2225 JDW Performed at [facility], [place] [address] [place], [place] [address]    Report Status 03/27/2020 FINAL  Final  Expectorated Sputum Assessment w Gram Stain, Rflx to Resp Cult     Status: None (Preliminary result)   Collection Time: 03/28/20  1:45 AM   Specimen: Sputum  Result Value Ref Range Status   Specimen Description SPUTUM  Final   Special Requests NONE  Final   Sputum evaluation   Final    Sputum specimen not acceptable for testing.  Please recollect.   RESULT CALLED TO, READ BACK BY AND VERIFIED WITH: P ASIDI RN 03/27/20 0354 JDW Performed at [facility], [place] [address] [place], [place] [address]    Report Status PENDING  Incomplete         Radiology Studies: CT CHEST W CONTRAST  Result Date: 03/28/2020 CLINICAL DATA:  Persistent cough. EXAM: CT CHEST WITH CONTRAST TECHNIQUE: Multidetector CT imaging of the chest was performed during intravenous contrast administration.  CONTRAST:  17mL OMNIPAQUE IOHEXOL 300 MG/ML  SOLN COMPARISON:  None. FINDINGS: Cardiovascular: Atherosclerotic calcification of the aorta. Heart is at the upper limits of normal to mildly enlarged with left ventricular dilatation. No pericardial effusion. Mediastinum/Nodes: Mediastinal lymph nodes measure up to 12 mm in the low left paratracheal station. Hilar lymph nodes measure up to 11 mm on the left. Subcarinal  lymph nodes measure up to 1.7 cm. No axillary adenopathy. Periesophageal lymph nodes measure up to 12 mm (3/62). There may be slight distal esophageal wall thickening. Lungs/Pleura: Centrilobular and paraseptal emphysema. Nodular and cavitary areas of airspace consolidation in the left upper and left lower lobes with areas of bronchiectasis and volume loss. Minimal dependent atelectasis in the right lower lobe. No pleural fluid. Airway is otherwise unremarkable. Upper Abdomen: Visualized portions of the liver and adrenal glands are unremarkable. Scattered scarring in the kidneys. Subcentimeter low-attenuation lesions in the kidneys are too small to characterize. Spleen is unremarkable. Chronic calcific pancreatitis. There may be a small hiatal hernia. Visualized portions of the stomach and bowel are otherwise grossly unremarkable. Upper abdominal lymph nodes measure up to 12 mm in the periportal region. Musculoskeletal: No worrisome lytic or sclerotic lesions. Disc calcification is seen at T10-11. IMPRESSION: 1. Nodular and cavitary airspace consolidation in the left upper and left lower lobes, [name], possibly reactive. Again, [name], as clinically indicated. 4. Areas of volume loss and bronchiectasis in the left upper and left lower lobes may be due to resolving infection/post infectious scarring. 5. Chronic calcific pancreatitis. 6.  Aortic atherosclerosis (ICD10-I70.0). 7.  Emphysema (ICD10-J43.9). Electronically Signed   By: Lorin Picket M.D.   On: 03/28/2020 08:22   MR Brain W and Wo Contrast  Result Date: 03/26/2020 CLINICAL DATA:  Neuro deficit, acute stroke suspected. Vision changes, headache. EXAM: MRI HEAD WITHOUT AND WITH CONTRAST  TECHNIQUE: Multiplanar, multiecho pulse sequences of the brain and surrounding structures were obtained without and with intravenous contrast. CONTRAST:  59mL GADAVIST GADOBUTROL 1 MMOL/ML IV SOLN COMPARISON:  Same day CTV. FINDINGS: Motion limited study. Brain: No acute infarction, hemorrhage, hydrocephalus, extra-axial collection or mass lesion. Vascular: Major arterial flow voids are maintained at the skull base. The area of abnormality seen on same-day CTV is poorly characterized on this study secondary to motion and flow related artifacts. Skull and upper cervical spine: Normal marrow signal. Sinuses/Orbits: Mild ethmoid air cell mucosal thickening without air-fluid levels. Unremarkable orbits. Other: No mastoid effusions. IMPRESSION: 1. No evidence of acute nonvascular abnormality. 2. The area of abnormality seen on same-day CTV is poorly characterized on this study secondary to motion and flow related artifact. While the appearance on the CTV was atypical in appearance/morphology for acute thrombus, a follow up CTV in approximately one week could evaluate for change if the patient's symptoms do not improve or worsen. Electronically Signed   By: Margaretha Sheffield MD   On: 03/26/2020 14:48   ECHOCARDIOGRAM COMPLETE  Result Date: 03/27/2020    ECHOCARDIOGRAM REPORT   Patient Name:   KALIN KYLER Date of Exam: 03/27/2020 Medical Rec #:  [id number]      Height:       73.0 in Accession #:    [id number]     Weight:       178.1 lb Date  of Birth:  20-Sep-1955     BSA:          2.048 m Patient Age:    26 years       BP:           142/70 mmHg Patient Gender: M              HR:           56 bpm. Exam Location:  Inpatient Procedure: 2D Echo, Cardiac Doppler and Color Doppler Indications:    Atrial flutter  History:        Patient has no prior history of Echocardiogram examinations.                 Arrythmias:Atrial Flutter; Risk Factors:Hypertension. HIV+.  Sonographer:    Dustin Flock Referring Phys: [id number]  [place]  1. Left ventricular ejection fraction, by estimation, is 50 to 55%. The left ventricle has low normal function. The left ventricle has no regional wall motion abnormalities. There is mild concentric left ventricular hypertrophy. Left ventricular diastolic parameters are indeterminate.  2. Right ventricular systolic function is normal. The right ventricular size is normal.  3. Left atrial size was mildly dilated.  4. Right atrial size was mildly dilated.  5. The mitral valve is normal in structure. Trivial mitral valve regurgitation. No evidence of mitral stenosis.  6. The aortic valve is tricuspid. Aortic valve regurgitation is not visualized. No aortic stenosis is present.  7. The inferior vena cava is normal in size with greater than 50% respiratory variability, suggesting right atrial pressure of 3 mmHg. Comparison(s): No prior Echocardiogram. Conclusion(s)/Recommendation(s): Normal biventricular function without evidence of hemodynamically significant valvular heart disease. FINDINGS  Left Ventricle: Left ventricular ejection fraction, by estimation, is 50 to 55%. The left ventricle has low normal function. The left ventricle has no regional wall motion abnormalities. The left ventricular internal cavity size was normal in size. There is mild concentric left ventricular hypertrophy. Left ventricular diastolic parameters are indeterminate. Right Ventricle: The right ventricular size is normal. Right vetricular wall thickness was not well visualized. Right ventricular systolic function is normal. Left Atrium: Left atrial size was mildly dilated. Right Atrium: Right atrial size was mildly dilated. Pericardium: There is no evidence of pericardial effusion. Mitral Valve: The mitral valve is normal in structure. Trivial mitral valve regurgitation. No evidence of mitral valve stenosis. Tricuspid Valve: The tricuspid valve is normal in structure. Tricuspid valve regurgitation is not  demonstrated. No evidence of tricuspid stenosis. Aortic Valve: The aortic valve is tricuspid. Aortic valve regurgitation is not visualized. No aortic stenosis is present. Pulmonic Valve: The pulmonic valve was grossly normal. Pulmonic valve regurgitation is not visualized. No evidence of pulmonic stenosis. Aorta: The aortic root, ascending aorta and aortic arch are all structurally normal, with no evidence of dilitation or obstruction. Venous: The inferior vena cava is normal in size with greater than 50% respiratory variability, suggesting right atrial pressure of 3 mmHg. IAS/Shunts: The atrial septum is grossly normal.  LEFT VENTRICLE PLAX 2D LVIDd:         5.00 cm  Diastology LVIDs:         3.80 cm  LV e' medial:    5.77 cm/s LV PW:         1.20 cm  LV E/e' medial:  14.8 LV IVS:        1.30 cm  LV e' lateral:   7.51 cm/s LVOT diam:     2.70 cm  LV E/e' lateral: 11.4 LV SV:         121 LV SV Index:   59 LVOT Area:     5.73 cm  RIGHT VENTRICLE RV Basal diam:  3.00 cm RV S prime:     13.20 cm/s TAPSE (M-mode): 2.6 cm LEFT ATRIUM             Index       RIGHT ATRIUM           Index LA diam:        3.20 cm 1.56 cm/m  RA Area:     15.60 cm LA Vol (A2C):   42.5 ml 20.75 ml/m RA Volume:   39.30 ml  19.19 ml/m LA Vol (A4C):   44.2 ml 21.58 ml/m LA Biplane Vol: 44.9 ml 21.92 ml/m  AORTIC VALVE LVOT Vmax:   99.80 cm/s LVOT Vmean:  67.700 cm/s LVOT VTI:    0.211 m  AORTA Ao Root diam: 3.20 cm MITRAL VALVE MV Area (PHT): 3.72 cm    SHUNTS MV Decel Time: 204 msec    Systemic VTI:  0.21 m MV E velocity: 85.30 cm/s  Systemic Diam: 2.70 cm MV A velocity: 70.70 cm/s MV E/A ratio:  1.21 Buford Dresser MD Electronically signed by Buford Dresser MD Signature Date/Time: 03/27/2020/7:28:47 PM    Final    CT VENOGRAM HEAD  Result Date: 03/26/2020 CLINICAL DATA:  Dural venous sinus thrombosis suspected. Headache. On antibiotics for sinusitis. EXAM: CT VENOGRAM HEAD TECHNIQUE: Contiguous axial images were obtained  from the base of the skull through the vertex during the bolus administration of intravenous contrast using CTV timing. Multiplanar CT image reconstructions and MIPs were obtained to evaluate the venous anatomy. CONTRAST:  11mL OMNIPAQUE IOHEXOL 350 MG/ML SOLN COMPARISON:  None. FINDINGS: Brain: No evidence of acute large vascular territory infarct, acute hemorrhage, hydrocephalus, extra-axial fluid collection or mass lesion. Mild patchy white matter hypoattenuation is nonspecific but most likely related to chronic microvascular ischemic disease. Vascular: Calcific atherosclerosis. Major arteries are grossly patent where visualized. Small focal hypodensity along the superior aspect of the superior sagittal sinus, possibly extradural in location (see series 10, image 134 and series 5, image 67). Otherwise, dural sinuses are patent. Small left transverse sinus. Skull: No acute fracture. Sinuses/orbits: Scattered ethmoid air cell mucosal thickening without air-fluid level. Unremarkable orbits. Other: No mastoid effusions. IMPRESSION: 1. Small focal hypodensity along the superior aspect of the superior sagittal sinus, possibly extradural in location and not typical in appearance/morphology for acute thrombus. Per Dr. Sherry Ruffing an MRI with and without contrast has already been ordered, which will further evaluate. 2. Ethmoid air cell mucosal thickening without air-fluid levels. Findings discussed with Dr. Sherry Ruffing via telephone at 12:36 PM. Electronically Signed   By: Margaretha Sheffield MD   On: 03/26/2020 12:42      Scheduled Meds: . amLODipine  5 mg Oral Daily  . azithromycin  1,200 mg Oral Weekly  . dextrose  10 mL Intravenous Q24H  . dextrose  10 mL Intravenous Q24H  . flucytosine  25 mg/kg Oral Q6H  . lidocaine  10 mL Infiltration Once  . sodium chloride  500 mL Intravenous Q24H  . sodium chloride  500 mL Intravenous Q24H  . sulfamethoxazole-trimethoprim  1 tablet Oral Daily   Continuous Infusions: .  amphotericin  B  Liposome (AMBISOME) ADULT IV 300 mg (03/27/20 2339)  . magnesium sulfate bolus IVPB       LOS: 2 days  Debbe Odea, MD [facility] Pager: [email or web address] 03/28/2020, 10:17 AM

## 2020-03-28 NOTE — Plan of Care (Signed)

## 2020-03-28 NOTE — Progress Notes (Addendum)
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 Patient started on amphotericin B and flucytosine 2/21.   CD4 count <35  Scr 0.96  K 4.2  Mg 1.9 (received 2 g IV)   Antimicrobials: - amphotericin B 2/21-c  - flucytosine 2/21-c - Bactrim SS daily for PJP prophylaxis  - Azithromycin 1,200 mg weekly for MAC prophylaxis   Plan: - continue amphotericin B and flucytosine for 2 weeks of induction (stop date 04/09/20); adjustments per ID team  - Continue 500 mL NS fluid boluses pre- and post- amphotericin B administration- Scr stable, no additional fluids required at this time  - monitor electrolytes (K, Mg) daily and supplement as needed - monitor Scr, and CBC daily (monitor neutropenia)  - hold off on antiretroviral therapy due to IRIS risk  - Follow-up VRDL, HIV viral load, AFB smear   Authorizing provider: Dr. Carlyle Basques, MD   Adria Dill, PharmD-Candidate  03/28/20 Infectious Diseases Consult Service

## 2020-03-28 NOTE — Plan of Care (Signed)
  Problem: Education: Goal: Knowledge of General Education information will improve Description: Including pain rating scale, medication(s)/side effects and non-pharmacologic comfort measures 03/28/2020 0227 by Lennox Grumbles, RN Outcome: Progressing 03/28/2020 0127 by Lennox Grumbles, RN Outcome: Progressing   Problem: Health Behavior/Discharge Planning: Goal: Ability to manage health-related needs will improve 03/28/2020 0227 by Lennox Grumbles, RN Outcome: Progressing 03/28/2020 0127 by Lennox Grumbles, RN Outcome: Progressing   Problem: Clinical Measurements: Goal: Ability to maintain clinical measurements within normal limits will improve 03/28/2020 0227 by Lennox Grumbles, RN Outcome: Progressing 03/28/2020 0127 by Lennox Grumbles, RN Outcome: Progressing Goal: Will remain free from infection 03/28/2020 0227 by Lennox Grumbles, RN Outcome: Progressing 03/28/2020 0127 by Lennox Grumbles, RN Outcome: Progressing Goal: Diagnostic test results will improve 03/28/2020 0227 by Lennox Grumbles, RN Outcome: Progressing 03/28/2020 0127 by Lennox Grumbles, RN Outcome: Progressing Goal: Respiratory complications will improve 03/28/2020 0227 by Lennox Grumbles, RN Outcome: Progressing 03/28/2020 0127 by Lennox Grumbles, RN Outcome: Progressing Goal: Cardiovascular complication will be avoided 03/28/2020 0227 by Lennox Grumbles, RN Outcome: Progressing 03/28/2020 0127 by Lennox Grumbles, RN Outcome: Progressing   Problem: Activity: Goal: Risk for activity intolerance will decrease 03/28/2020 0227 by Lennox Grumbles, RN Outcome: Progressing 03/28/2020 0127 by Lennox Grumbles, RN Outcome: Progressing   Problem: Nutrition: Goal: Adequate nutrition will be maintained 03/28/2020 0227 by Lennox Grumbles, RN Outcome: Progressing 03/28/2020 0127 by Lennox Grumbles, RN Outcome: Progressing   Problem: Coping: Goal: Level of anxiety will decrease 03/28/2020 0227 by Lennox Grumbles, RN Outcome:  Progressing 03/28/2020 0127 by Lennox Grumbles, RN Outcome: Progressing   Problem: Elimination: Goal: Will not experience complications related to bowel motility 03/28/2020 0227 by Lennox Grumbles, RN Outcome: Progressing 03/28/2020 0127 by Lennox Grumbles, RN Outcome: Progressing Goal: Will not experience complications related to urinary retention 03/28/2020 0227 by Lennox Grumbles, RN Outcome: Progressing 03/28/2020 0127 by Lennox Grumbles, RN Outcome: Progressing   Problem: Pain Managment: Goal: General experience of comfort will improve 03/28/2020 0227 by Lennox Grumbles, RN Outcome: Progressing 03/28/2020 0127 by Lennox Grumbles, RN Outcome: Progressing   Problem: Safety: Goal: Ability to remain free from injury will improve 03/28/2020 0227 by Lennox Grumbles, RN Outcome: Progressing 03/28/2020 0127 by Lennox Grumbles, RN Outcome: Progressing   Problem: Skin Integrity: Goal: Risk for impaired skin integrity will decrease 03/28/2020 0227 by Lennox Grumbles, RN Outcome: Progressing 03/28/2020 0127 by Lennox Grumbles, RN Outcome: Progressing

## 2020-03-28 NOTE — Progress Notes (Signed)
HR in 150's per telemetry. HR wnl w/ vs machine. Pt asymptomatic. Dr. Tonie Griffith informed.

## 2020-03-28 NOTE — Consult Note (Signed)
CARDIOLOGY CONSULT NOTE  Patient ID: Ricky [name] MRN: [id number] DOB/AGE: [birth year] 65 y.o.  Admit date: 03/26/2020 Attending physician: Debbe Odea, MD Primary Physician:  Kathyrn Lass Outpatient Cardiologist: NA Inpatient Cardiologist: Rex Kras, DO, [facility]  Chief complaint: Headaches  Reason of consultation: Supraventricular tachycardia Referring physician: Debbe Odea, MD  HPI:  Ricky [name] is a 65 y.o. African-American male who presents with a chief complaint of " headaches." His past medical history and cardiovascular risk factors include: HIV not on antiretroviral medications, noncompliance, cigarette smoking, recent COVID-19 infection (January 2022), hypertension, protein calorie malnutrition, aortic atherosclerosis, cavitary consolidation in the left lung fields on recent CT imaging, emphysema.  Patient presented to the ED on 03/26/2020 for constant/progressive headaches with dizziness, photophobia, sinus pressure, diaphoresis, rhinorrhea, productive cough, blurred vision for weeks prior to seeking medical attention.  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  Cardiology is consulted for further recommendations regarding management of supraventricular tachycardia noted multiple times during this hospitalization.  Review of electronic medical records notes that he had an episode of atrial flutter while in the ED and was started on diltiazem drip.  Per attending physician he is responding very well to beta-blocker therapy and he is restarted on Lopressor as of today.  I reviewed EKGs present in epic which notes supraventricular tachycardia but no clear evidence of atrial flutter.  Rhythm strips documented in the epic from yesterday were also reviewed as a part of this consultation.  Clinically patient is resting in bed comfortably.  He denies any chest pain or shortness  of breath at rest or with effort related activities.  He denies any prior history of CAD, myocardial infarction, DVT or PE.  Patient denies any prior history of atrial fibrillation or atrial flutter.  Patient has a longstanding history of noncompliance with medical therapy in the past as well as for follow-up.  I suspect that he has poor insight regarding his chronic comorbid conditions.  ALLERGIES: No Known Allergies  PAST MEDICAL HISTORY: Past Medical History:  Diagnosis Date  . Human immunodeficiency virus (HIV) disease ([place]) 03/26/2020  COVID-19 infection Hypertension. Protein calorie malnutrition. Aortic atherosclerosis. Cavitary consolidation in the left lung field on the recent CT scan 03/2020 Emphysema Cigarette smoking Alcohol use Recreational cocaine use  PAST SURGICAL HISTORY: None per patient  FAMILY HISTORY: The patient family history includes Hypertension in his brother, mother, and sister; Kidney disease in his mother.   SOCIAL HISTORY:  The patient  reports that he has been smoking cigarettes. He does not have any smokeless tobacco history on file. He reports current alcohol use of about 14.0 - 21.0 standard drinks of alcohol per week. He reports previous drug use.  Recreational use of cocaine.  Last episode 3 weeks ago  MEDICATIONS: Current Outpatient Medications  Medication Instructions  . amoxicillin-clavulanate (AUGMENTIN) 875-125 MG tablet 1 tablet, Oral, 2 times daily, One po bid x 7 days  . benzonatate (TESSALON) 100 mg, Oral, Every 8 hours    REVIEW OF SYSTEMS: Review of Systems  Constitutional: Negative for chills and fever.  HENT: Positive for congestion. Negative for hoarse voice and nosebleeds.   Eyes: Negative for discharge, double vision and pain.  Cardiovascular: Negative for chest pain, claudication, dyspnea on exertion, leg swelling, near-syncope, orthopnea, palpitations, paroxysmal nocturnal dyspnea and syncope.  Respiratory: Positive for  cough. Negative for hemoptysis and shortness of breath.   Musculoskeletal: Negative for muscle  cramps and myalgias.  Gastrointestinal: Negative for abdominal pain, constipation, diarrhea, hematemesis, hematochezia, melena, nausea and vomiting.  Neurological: Positive for dizziness and headaches. Negative for light-headedness.    PHYSICAL EXAM: Vitals with BMI 03/28/2020 03/28/2020 03/28/2020  Height - - -  Weight - - -  BMI - - -  Systolic 563 149 702  Diastolic 97 95 98  Pulse 58 57 61    Intake/Output Summary (Last 24 hours) at 03/28/2020 1046 Last data filed at 03/28/2020 6378 Gross per 24 hour  Intake 810.38 ml  Output 4000 ml  Net -3189.62 ml    Net IO Since Admission: -3,175.44 mL [03/28/20 1046]  CONSTITUTIONAL: Appears older than stated age, hemodynamically stable, no acute distress.   SKIN: Skin is warm and dry. No rash noted. No cyanosis. No pallor. No jaundice HEAD: Normocephalic and atraumatic.  EYES: No scleral icterus MOUTH/THROAT: Dry oral membranes.  NECK: No JVD present. No thyromegaly noted. No carotid bruits  LYMPHATIC: No visible cervical adenopathy.  CHEST Normal respiratory effort. No intercostal retractions  LUNGS: Decreased breath sounds at the bases.  Rhonchi noted left greater than right.  No rales CARDIOVASCULAR: Regular, positive S1-S2, no murmurs rubs or gallops appreciated ABDOMINAL: No apparent ascites.  EXTREMITIES: No peripheral edema  HEMATOLOGIC: No significant bruising NEUROLOGIC: Oriented to person, place, and time. Nonfocal. Normal muscle tone.  PSYCHIATRIC: Normal mood and affect. Normal behavior. Cooperative  RADIOLOGY: CT CHEST W CONTRAST  Result Date: 03/28/2020 CLINICAL DATA:  Persistent cough. EXAM: CT CHEST WITH CONTRAST TECHNIQUE: Multidetector CT imaging of the chest was performed during intravenous contrast administration. CONTRAST:  15mL OMNIPAQUE IOHEXOL 300 MG/ML  SOLN COMPARISON:  None. FINDINGS: Cardiovascular:  Atherosclerotic calcification of the aorta. Heart is at the upper limits of normal to mildly enlarged with left ventricular dilatation. No pericardial effusion. Mediastinum/Nodes: Mediastinal lymph nodes measure up to 12 mm in the low left paratracheal station. Hilar lymph nodes measure up to 11 mm on the left. Subcarinal lymph nodes measure up to 1.7 cm. No axillary adenopathy. Periesophageal lymph nodes measure up to 12 mm (3/62). There may be slight distal esophageal wall thickening. Lungs/Pleura: Centrilobular and paraseptal emphysema. Nodular and cavitary areas of airspace consolidation in the left upper and left lower lobes with areas of bronchiectasis and volume loss. Minimal dependent atelectasis in the right lower lobe. No pleural fluid. Airway is otherwise unremarkable. Upper Abdomen: Visualized portions of the liver and adrenal glands are unremarkable. Scattered scarring in the kidneys. Subcentimeter low-attenuation lesions in the kidneys are too small to characterize. Spleen is unremarkable. Chronic calcific pancreatitis. There may be a small hiatal hernia. Visualized portions of the stomach and bowel are otherwise grossly unremarkable. Upper abdominal lymph nodes measure up to 12 mm in the periportal region. Musculoskeletal: No worrisome lytic or sclerotic lesions. Disc calcification is seen at T10-11. IMPRESSION: 1. Nodular and cavitary airspace consolidation in the left upper and left lower lobes, [name], possibly reactive. Again, [name], as clinically indicated. 4. Areas of volume loss and bronchiectasis in the left upper and left lower lobes may be due to resolving infection/post infectious scarring. 5. Chronic calcific  pancreatitis. 6.  Aortic atherosclerosis (ICD10-I70.0). 7.  Emphysema (ICD10-J43.9). Electronically Signed   By: Lorin Picket M.D.   On: 03/28/2020 08:22  MR Brain W and Wo Contrast  Result Date: 03/26/2020 CLINICAL DATA:  Neuro deficit, acute stroke suspected. Vision changes, headache. EXAM: MRI HEAD WITHOUT AND WITH CONTRAST TECHNIQUE: Multiplanar, multiecho pulse sequences of the brain and surrounding structures were obtained without and with intravenous contrast. CONTRAST:  38mL GADAVIST GADOBUTROL 1 MMOL/ML IV SOLN COMPARISON:  Same day CTV. FINDINGS: Motion limited study. Brain: No acute infarction, hemorrhage, hydrocephalus, extra-axial collection or mass lesion. Vascular: Major arterial flow voids are maintained at the skull base. The area of abnormality seen on same-day CTV is poorly characterized on this study secondary to motion and flow related artifacts. Skull and upper cervical spine: Normal marrow signal. Sinuses/Orbits: Mild ethmoid air cell mucosal thickening without air-fluid levels. Unremarkable orbits. Other: No mastoid effusions. IMPRESSION: 1. No evidence of acute nonvascular abnormality. 2. The area of abnormality seen on same-day CTV is poorly characterized on this study secondary to motion and flow related artifact. While the appearance on the CTV was atypical in appearance/morphology for acute thrombus, a follow up CTV in approximately one week could evaluate for change if the patient's symptoms do not improve or worsen. Electronically Signed   By: Margaretha Sheffield MD   On: 03/26/2020 14:48   ECHOCARDIOGRAM COMPLETE  Result Date: 03/27/2020    ECHOCARDIOGRAM REPORT   Patient Name:   Ricky [name] Date of Exam: 03/27/2020 Medical Rec #:  [id number]      Height:       73.0 in Accession #:    [id number]     Weight:       178.1 lb Date of Birth:  [birth year]     BSA:          2.048 m Patient Age:    10 years       BP:           142/70 mmHg Patient Gender: M              HR:            56 bpm. Exam Location:  Inpatient Procedure: 2D Echo, Cardiac Doppler and Color Doppler Indications:    Atrial flutter  History:        Patient has no prior history of Echocardiogram examinations.                 Arrythmias:Atrial Flutter; Risk Factors:Hypertension. HIV+.  Sonographer:    Dustin Flock Referring Phys: [id number] [place]  1. Left ventricular ejection fraction, by estimation, is 50 to 55%. The left ventricle has low normal function. The left ventricle has no regional wall motion abnormalities. There is mild concentric left ventricular hypertrophy. Left ventricular diastolic parameters are indeterminate.  2. Right ventricular systolic function is normal. The right ventricular size is normal.  3. Left atrial size was mildly dilated.  4. Right atrial size was mildly dilated.  5. The mitral valve is normal in structure. Trivial mitral valve regurgitation. No evidence of mitral stenosis.  6. The aortic valve is tricuspid. Aortic valve regurgitation is not visualized. No aortic stenosis is present.  7. The inferior vena cava is normal in size with greater than 50% respiratory variability, suggesting right atrial pressure of 3 mmHg. Comparison(s): No prior Echocardiogram. Conclusion(s)/Recommendation(s): Normal biventricular function without evidence of hemodynamically significant valvular heart disease. FINDINGS  Left Ventricle: Left ventricular ejection fraction, by estimation, is 50 to 55%. The left ventricle has low normal function. The left ventricle has no regional wall motion abnormalities. The left ventricular internal  cavity size was normal in size. There is mild concentric left ventricular hypertrophy. Left ventricular diastolic parameters are indeterminate. Right Ventricle: The right ventricular size is normal. Right vetricular wall thickness was not well visualized. Right ventricular systolic function is normal. Left Atrium: Left atrial size was mildly dilated. Right  Atrium: Right atrial size was mildly dilated. Pericardium: There is no evidence of pericardial effusion. Mitral Valve: The mitral valve is normal in structure. Trivial mitral valve regurgitation. No evidence of mitral valve stenosis. Tricuspid Valve: The tricuspid valve is normal in structure. Tricuspid valve regurgitation is not demonstrated. No evidence of tricuspid stenosis. Aortic Valve: The aortic valve is tricuspid. Aortic valve regurgitation is not visualized. No aortic stenosis is present. Pulmonic Valve: The pulmonic valve was grossly normal. Pulmonic valve regurgitation is not visualized. No evidence of pulmonic stenosis. Aorta: The aortic root, ascending aorta and aortic arch are all structurally normal, with no evidence of dilitation or obstruction. Venous: The inferior vena cava is normal in size with greater than 50% respiratory variability, suggesting right atrial pressure of 3 mmHg. IAS/Shunts: The atrial septum is grossly normal.  LEFT VENTRICLE PLAX 2D LVIDd:         5.00 cm  Diastology LVIDs:         3.80 cm  LV e' medial:    5.77 cm/s LV PW:         1.20 cm  LV E/e' medial:  14.8 LV IVS:        1.30 cm  LV e' lateral:   7.51 cm/s LVOT diam:     2.70 cm  LV E/e' lateral: 11.4 LV SV:         121 LV SV Index:   59 LVOT Area:     5.73 cm  RIGHT VENTRICLE RV Basal diam:  3.00 cm RV S prime:     13.20 cm/s TAPSE (M-mode): 2.6 cm LEFT ATRIUM             Index       RIGHT ATRIUM           Index LA diam:        3.20 cm 1.56 cm/m  RA Area:     15.60 cm LA Vol (A2C):   42.5 ml 20.75 ml/m RA Volume:   39.30 ml  19.19 ml/m LA Vol (A4C):   44.2 ml 21.58 ml/m LA Biplane Vol: 44.9 ml 21.92 ml/m  AORTIC VALVE LVOT Vmax:   99.80 cm/s LVOT Vmean:  67.700 cm/s LVOT VTI:    0.211 m  AORTA Ao Root diam: 3.20 cm MITRAL VALVE MV Area (PHT): 3.72 cm    SHUNTS MV Decel Time: 204 msec    Systemic VTI:  0.21 m MV E velocity: 85.30 cm/s  Systemic Diam: 2.70 cm MV A velocity: 70.70 cm/s MV E/A ratio:  1.21 Buford Dresser MD Electronically signed by Buford Dresser MD Signature Date/Time: 03/27/2020/7:28:47 PM    Final    CT VENOGRAM HEAD  Result Date: 03/26/2020 CLINICAL DATA:  Dural venous sinus thrombosis suspected. Headache. On antibiotics for sinusitis. EXAM: CT VENOGRAM HEAD TECHNIQUE: Contiguous axial images were obtained from the base of the skull through the vertex during the bolus administration of intravenous contrast using CTV timing. Multiplanar CT image reconstructions and MIPs were obtained to evaluate the venous anatomy. CONTRAST:  26mL OMNIPAQUE IOHEXOL 350 MG/ML SOLN COMPARISON:  None. FINDINGS: Brain: No evidence of acute large vascular territory infarct, acute hemorrhage, hydrocephalus, extra-axial fluid collection or mass  lesion. Mild patchy white matter hypoattenuation is nonspecific but most likely related to chronic microvascular ischemic disease. Vascular: Calcific atherosclerosis. Major arteries are grossly patent where visualized. Small focal hypodensity along the superior aspect of the superior sagittal sinus, possibly extradural in location (see series 10, image 134 and series 5, image 67). Otherwise, dural sinuses are patent. Small left transverse sinus. Skull: No acute fracture. Sinuses/orbits: Scattered ethmoid air cell mucosal thickening without air-fluid level. Unremarkable orbits. Other: No mastoid effusions. IMPRESSION: 1. Small focal hypodensity along the superior aspect of the superior sagittal sinus, possibly extradural in location and not typical in appearance/morphology for acute thrombus. Per Dr. Sherry Ruffing an MRI with and without contrast has already been ordered, which will further evaluate. 2. Ethmoid air cell mucosal thickening without air-fluid levels. Findings discussed with Dr. Sherry Ruffing via telephone at 12:36 PM. Electronically Signed   By: Margaretha Sheffield MD   On: 03/26/2020 12:42    LABORATORY DATA: Lab Results  Component Value Date   WBC 5.4 03/27/2020    HGB 14.3 03/27/2020   HCT 42.4 03/27/2020   MCV 99.8 03/27/2020   PLT 324 03/27/2020    Recent Labs  Lab 03/28/20 0428  NA 133*  K 4.2  CL 99  CO2 25  BUN 13  CREATININE 0.96  CALCIUM 8.6*  PROT 7.1  BILITOT 0.9  ALKPHOS 92  ALT 16  AST 25  GLUCOSE 115*    Lipid Panel  No results found for: CHOL, TRIG, HDL, CHOLHDL, VLDL, LDLCALC  BNP (last 3 results) No results for input(s): BNP in the last 8760 hours.  HEMOGLOBIN A1C No results found for: HGBA1C, MPG  Cardiac Panel (last 3 results) No results for input(s): CKTOTAL, CKMB, TROPONINIHS, RELINDX in the last 72 hours.  TSH Recent Labs    03/27/20 0949  TSH 0.628    CARDIAC DATABASE: EKG: 03/03/2020 00:06: Normal sinus rhythm, 85 bpm, normal axis, borderline repolarization abnormality, without underlying injury pattern.  03/27/2020 at 03:18: Supraventricular tachycardia, 175 bpm, normal axis, LVH per voltage criteria, ST-T changes most likely secondary to repolarization abnormality.  Compared to prior EKG normal sinus rhythm has transition to SVT.  03/27/2020 at 3:19: Supraventricular tachycardia 179 bpm, LVH per voltage criteria, ST-T changes most likely secondary to repolarization abnormality.   03/27/2020 at 20:17: Supraventricular tachycardia 139 bpm, LVH per voltage criteria, ST-T changes most likely secondary to repolarization abnormality and rate dependent.  Echocardiogram: 03/28/2020: 1. Left ventricular ejection fraction, by estimation, is 50 to 55%. The  left ventricle has low normal function. The left ventricle has no regional  wall motion abnormalities. There is mild concentric left ventricular  hypertrophy. Left ventricular  diastolic parameters are indeterminate.  2. Right ventricular systolic function is normal. The right ventricular  size is normal.  3. Left atrial size was mildly dilated.  4. Right atrial size was mildly dilated.  5. The mitral valve is normal in structure. Trivial mitral  valve  regurgitation. No evidence of mitral stenosis.  6. The aortic valve is tricuspid. Aortic valve regurgitation is not  visualized. No aortic stenosis is present.  7. The inferior vena cava is normal in size with greater than 50%  respiratory variability, suggesting right atrial pressure of 3 mmHg.  IMPRESSION & RECOMMENDATIONS: Ricky [name] is a 65 y.o. African-American male whose past medical history and cardiovascular risk factors include: HIV not on antiretroviral medications, noncompliance, cigarette smoking, recent COVID-19 infection (January 2022), hypertension, protein calorie malnutrition, aortic atherosclerosis, cavitary consolidation in the  left lung fields on recent CT imaging, emphysema.  Supraventricular tachycardia: Patient has had multiple EKGs during his hospitalization and the underlying rhythm ranges between normal sinus and supraventricular tachycardia.  There is mention of brief episode of possible atrial flutter in the ED but there is no corresponding EKG or telemetry strips for me to review.  However, given the episodic episodes of SVT and echocardiogram noting preserved LVEF with biatrial enlargement atrial flutter is in the differential diagnoses.  However given the fact that the patient is noncompliant with his medical therapy, a recent lumbar puncture approximately 2 days ago, and no clear evidence of atrial flutter on telemetry or EKG we will hold off on oral anticoagulation at this time as the risks outweigh the benefits.  In addition, patient states that if he did qualify for anticoagulation he would not be too inclined at this time.   Recommend rate control strategy.  He states that he does not recreational cocaine use last episode was 3 weeks ago.   Will discontinue Lopressor and transition him to Cardizem 30 mg p.o. 3 times daily with holding parameters.  In addition, check urine drug screen.  His episodes of SVT/tachycardia may also be associated with his  underlying meningitis nodular/cavitary airspace consolidation in the left lung fields, and AIDS.  Patient does have ST T changes on multiple EKGs which is most likely secondary to LVH and or rate related change.  However given his risk factors and advanced age cannot rule out underlining ischemia. In a patient who has demonstrated noncompliance I suspect that he will not follow-up as outpatient.  Therefore would recommend a Lexiscan prior to discharge to rule out underlying ischemia.  Continue telemetry.  Recommended outpatient follow-up to further evaluate with a extended Holter monitor or mobile cardiac ambulatory telemetry.  Patient verbalizes understanding.  Aortic atherosclerosis on CT imaging:  Recommend aspirin 81 mg po day (when cleared by neurology given the recent LP) and statin therapy.  Recommend checking fasting lipid profile.  Benign essential hypertension: Currently managed by primary team.  Cigarette smoking: Educated on importance of complete smoking cessation.  Cocaine use: Educated on importance of complete cessation of cocaine.  Last use 3 weeks ago per patient. 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 Protein calorie malnutrition: Currently managed by primary team Noncompliance  Total encounter time 87 minutes. *Total Encounter Time as defined by the Centers for Medicare and Medicaid Services includes, in addition to the face-to-face time of a patient visit (documented in the note above) non-face-to-face time: obtaining and reviewing outside history, ordering and reviewing medications, tests or procedures, care coordination (communications with other health care professionals or caregivers) and documentation in the medical record.  Patient's questions and concerns were addressed to his satisfaction. He voices understanding of the instructions provided during this encounter.   This note was created using a voice recognition  software as a result there may be grammatical errors inadvertently enclosed that do not reflect the nature of this encounter. Every attempt is made to correct such errors.  Mechele Claude [facility]  Pager: [phone] Office: [phone] 03/28/2020, 10:46 AM

## 2020-03-28 NOTE — Progress Notes (Signed)
Pt's EKG complete. Dr. Tonie Griffith informed.

## 2020-03-28 NOTE — Progress Notes (Signed)
Pt's sputum sample not correct per Micro lab. Dr. Hal Hope informed and a reorder requested.

## 2020-03-29 ENCOUNTER — Inpatient Hospital Stay (HOSPITAL_COMMUNITY): Payer: Medicaid Other

## 2020-03-29 DIAGNOSIS — R519 Headache, unspecified: Secondary | ICD-10-CM

## 2020-03-29 DIAGNOSIS — E8809 Other disorders of plasma-protein metabolism, not elsewhere classified: Secondary | ICD-10-CM | POA: Diagnosis not present

## 2020-03-29 DIAGNOSIS — F1490 Cocaine use, unspecified, uncomplicated: Secondary | ICD-10-CM

## 2020-03-29 DIAGNOSIS — I10 Essential (primary) hypertension: Secondary | ICD-10-CM | POA: Diagnosis not present

## 2020-03-29 DIAGNOSIS — B20 Human immunodeficiency virus [HIV] disease: Secondary | ICD-10-CM | POA: Diagnosis not present

## 2020-03-29 DIAGNOSIS — F17200 Nicotine dependence, unspecified, uncomplicated: Secondary | ICD-10-CM

## 2020-03-29 DIAGNOSIS — I4892 Unspecified atrial flutter: Secondary | ICD-10-CM | POA: Diagnosis not present

## 2020-03-29 DIAGNOSIS — R9431 Abnormal electrocardiogram [ECG] [EKG]: Secondary | ICD-10-CM

## 2020-03-29 LAB — CSF CELL COUNT WITH DIFFERENTIAL
Eosinophils, CSF: 0 % (ref 0–1)
Eosinophils, CSF: 1 % (ref 0–1)
Lymphs, CSF: 43 % (ref 40–80)
Lymphs, CSF: 54 % (ref 40–80)
Monocyte-Macrophage-Spinal Fluid: 38 % (ref 15–45)
Monocyte-Macrophage-Spinal Fluid: 46 % — ABNORMAL HIGH (ref 15–45)
Other Cells, CSF: 1
Other Cells, CSF: 2
RBC Count, CSF: 153 /mm3 — ABNORMAL HIGH
RBC Count, CSF: 87 /mm3 — ABNORMAL HIGH
Segmented Neutrophils-CSF: 6 % (ref 0–6)
Segmented Neutrophils-CSF: 9 % — ABNORMAL HIGH (ref 0–6)
Tube #: 1
Tube #: 4
WBC, CSF: 87 /mm3 (ref 0–5)
WBC, CSF: 96 /mm3 (ref 0–5)

## 2020-03-29 LAB — COMPREHENSIVE METABOLIC PANEL
ALT: 17 U/L (ref 0–44)
AST: 27 U/L (ref 15–41)
Albumin: 3 g/dL — ABNORMAL LOW (ref 3.5–5.0)
Alkaline Phosphatase: 93 U/L (ref 38–126)
Anion gap: 8 (ref 5–15)
BUN: 8 mg/dL (ref 8–23)
CO2: 27 mmol/L (ref 22–32)
Calcium: 9 mg/dL (ref 8.9–10.3)
Chloride: 96 mmol/L — ABNORMAL LOW (ref 98–111)
Creatinine, Ser: 1.06 mg/dL (ref 0.61–1.24)
GFR, Estimated: >60 mL/min (ref >60)
Glucose, Bld: 105 mg/dL — ABNORMAL HIGH (ref 70–99)
Potassium: 4 mmol/L (ref 3.5–5.1)
Sodium: 131 mmol/L — ABNORMAL LOW (ref 135–145)
Total Bilirubin: 1 mg/dL (ref 0.3–1.2)
Total Protein: 7.7 g/dL (ref 6.5–8.1)

## 2020-03-29 LAB — ACID FAST SMEAR (AFB, MYCOBACTERIA): Acid Fast Smear: NEGATIVE

## 2020-03-29 LAB — PROTIME-INR
INR: 1.1 (ref 0.8–1.2)
Prothrombin Time: 13.5 seconds (ref 11.4–15.2)

## 2020-03-29 LAB — LIPID PANEL
Cholesterol: 141 mg/dL (ref 0–200)
HDL: 31 mg/dL — ABNORMAL LOW (ref >40)
LDL Cholesterol: 86 mg/dL (ref 0–99)
Total CHOL/HDL Ratio: 4.5 RATIO
Triglycerides: 118 mg/dL (ref <150)
VLDL: 24 mg/dL (ref 0–40)

## 2020-03-29 LAB — MAGNESIUM: Magnesium: 1.9 mg/dL (ref 1.7–2.4)

## 2020-03-29 LAB — PROTEIN AND GLUCOSE, CSF
Glucose, CSF: 46 mg/dL (ref 40–70)
Total  Protein, CSF: 112 mg/dL — ABNORMAL HIGH (ref 15–45)

## 2020-03-29 LAB — CSF CULTURE W GRAM STAIN

## 2020-03-29 LAB — CRYPTOCOCCAL ANTIGEN, CSF
Crypto Ag: POSITIVE — AB
Cryptococcal Ag Titer: 640 — AB

## 2020-03-29 IMAGING — CT CT HEAD W/O CM
3 of 4 series · 15 of 47 positions shown, 18 images · non-contrast
Comparison: [DATE] and prior.

CLINICAL DATA: Meningitis/CNS infection suspected

EXAM:
CT HEAD WITHOUT CONTRAST
TECHNIQUE: Contiguous axial images were obtained from the base of the skull
through the vertex without intravenous contrast.

[Series 4: head 2.0 h70h · axial · 0.45mm/px · z∈[+3,+121]mm · 9 of 75 slices shown, 12 images]
[im 8/75  brain]
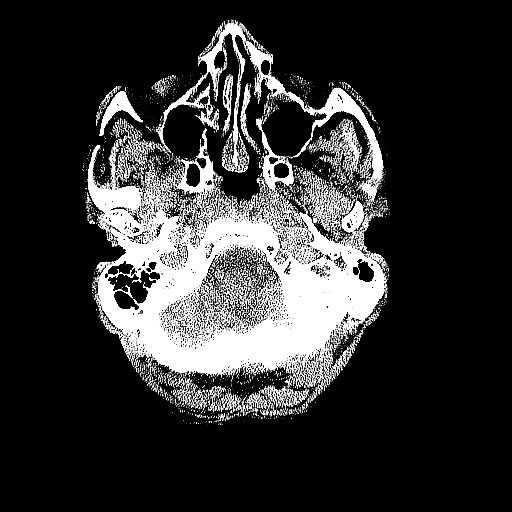
[im 8/75  bone]
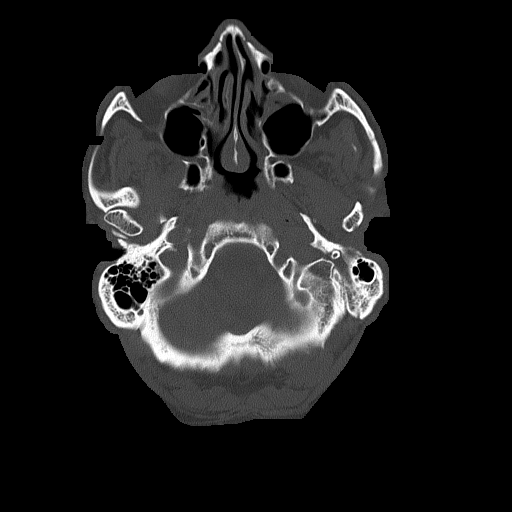
[im 15/75  brain]
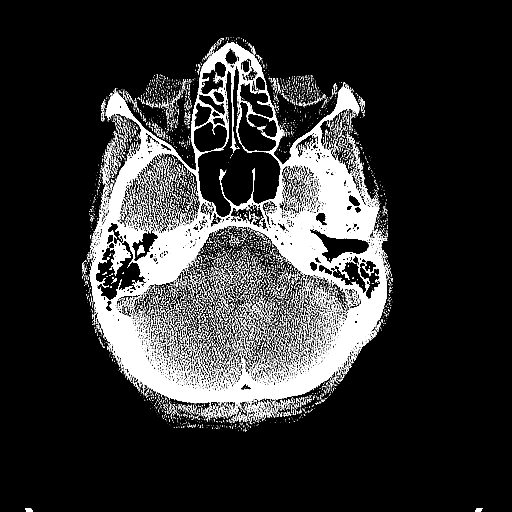
[im 23/75  brain]
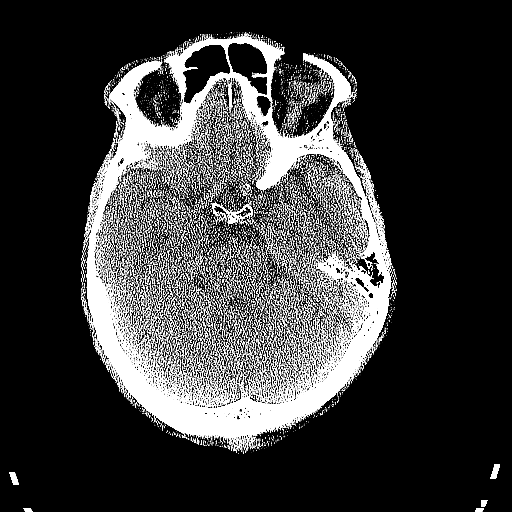
[im 30/75  brain]
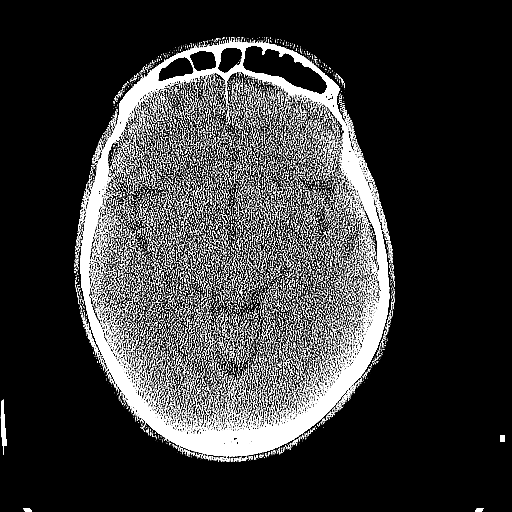
[im 38/75  brain]
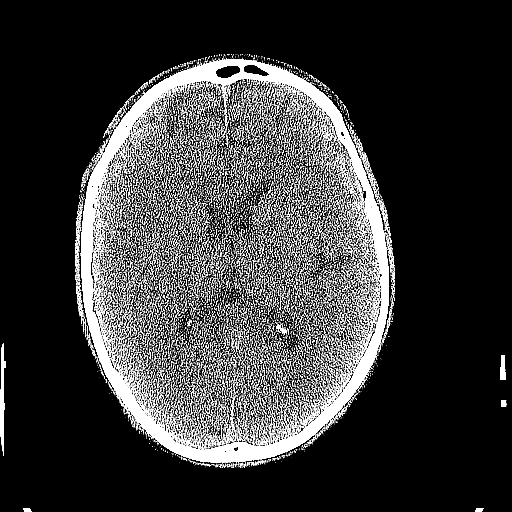
[im 38/75  bone]
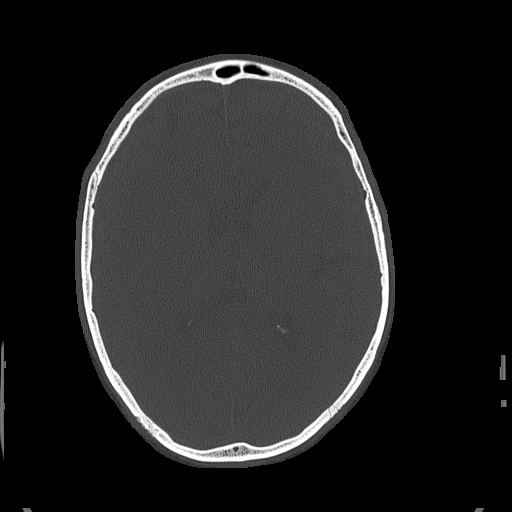
[im 45/75  brain]
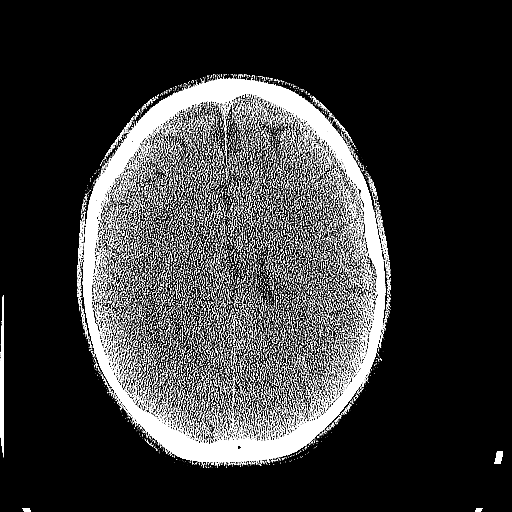
[im 52/75  brain]
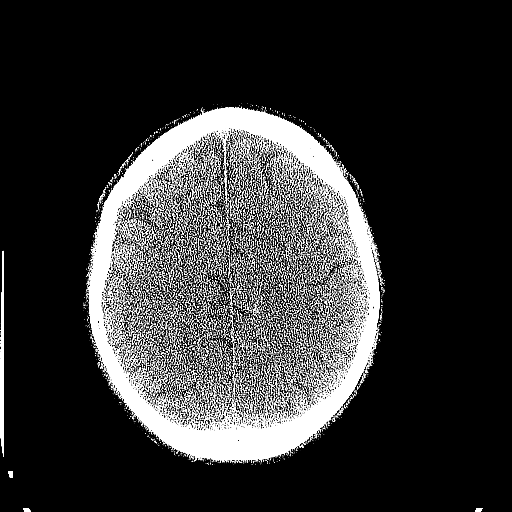
[im 60/75  brain]
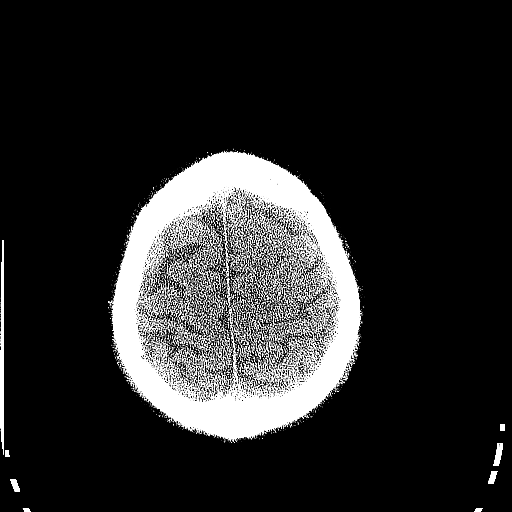
[im 67/75  brain]
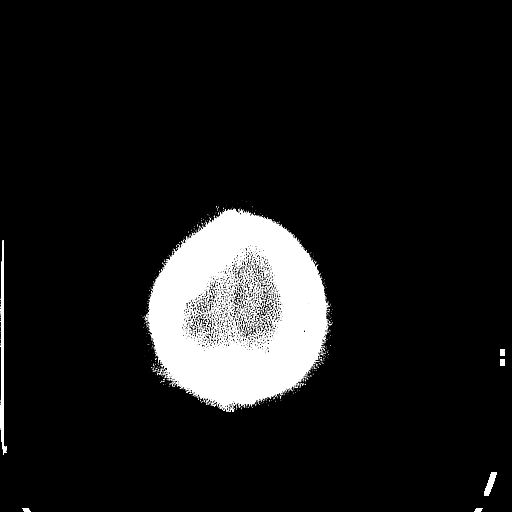
[im 67/75  bone]
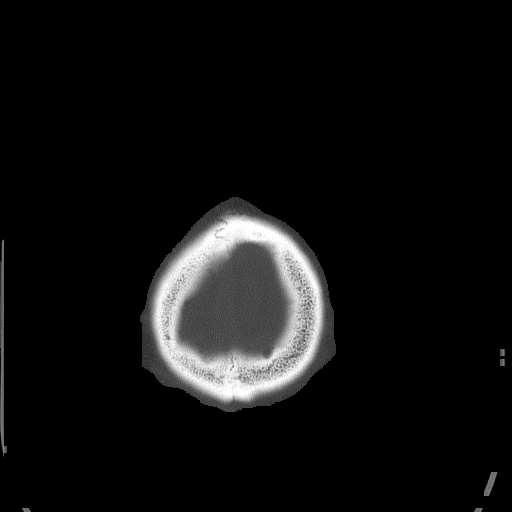

[Series 5: head 3.0 mpr cor · coronal · 0.30mm/px · 3 of 69 slices shown]
[im 23/69  brain]
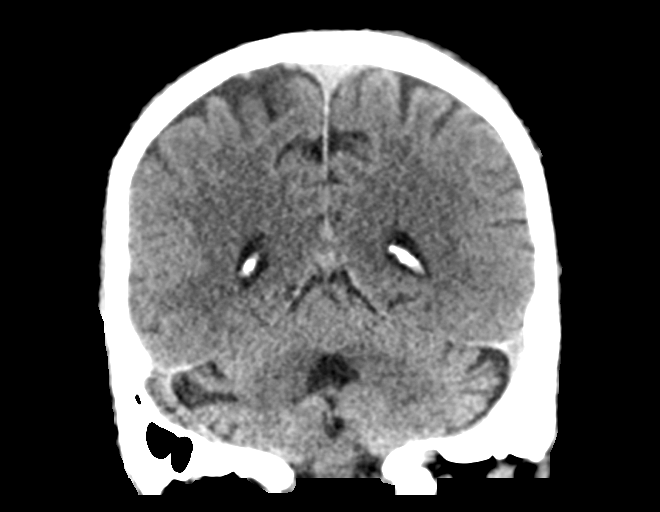
[im 31/69  brain]
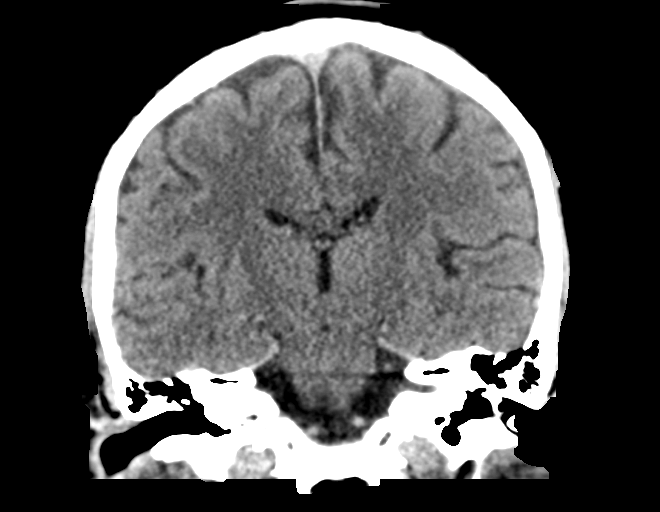
[im 38/69  brain]
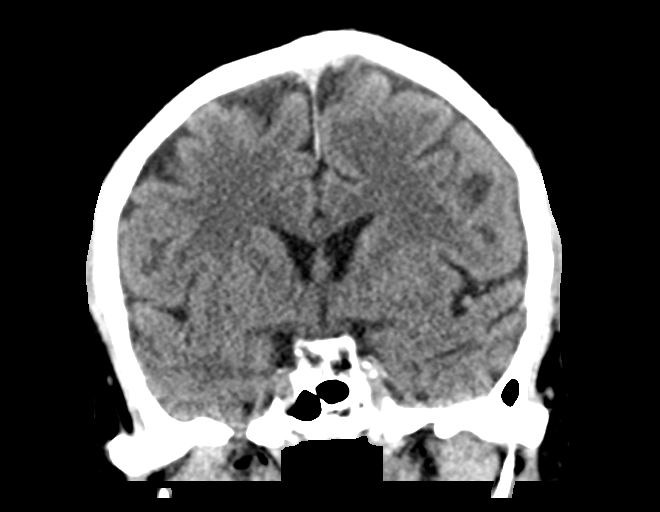

[Series 6: head 3.0 mpr sag · sagittal · 0.32mm/px · 3 of 54 slices shown]
[im 18/54  brain]
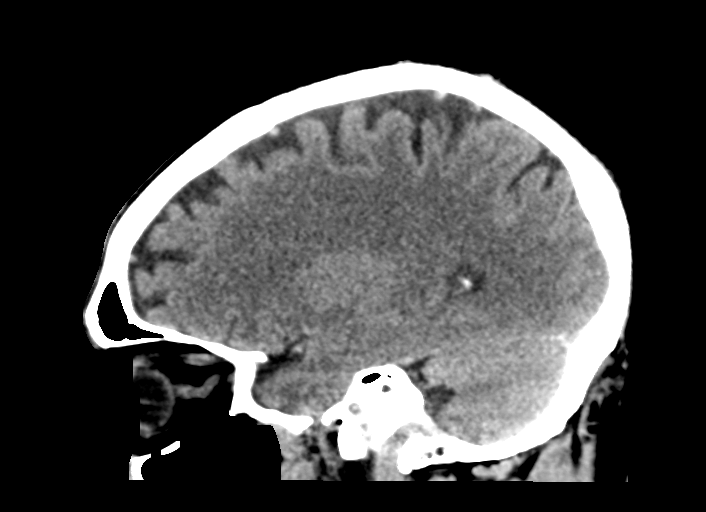
[im 27/54  brain]
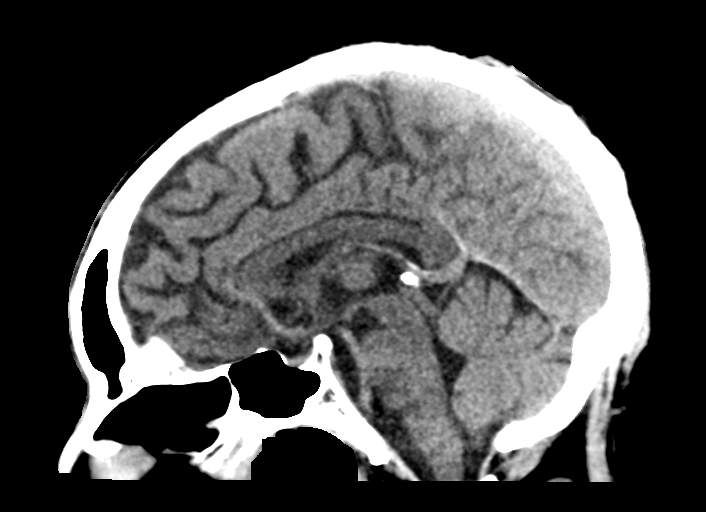
[im 36/54  brain]
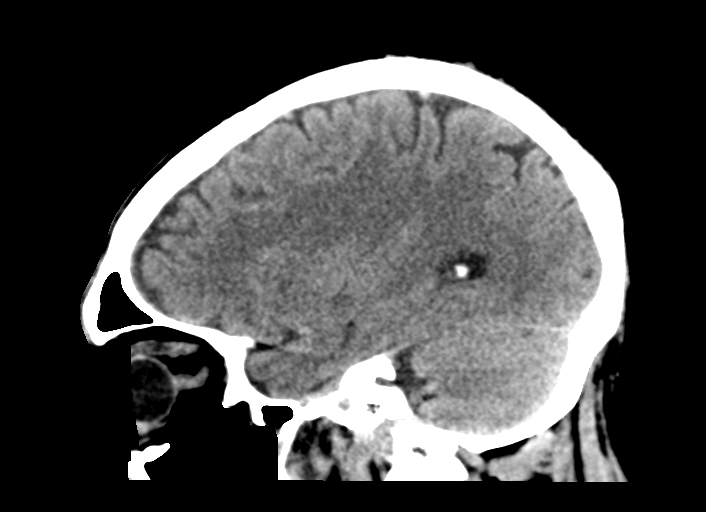

[15 of 47 positions shown; findings below may reference images not displayed]

FINDINGS: Brain: No acute infarct or intracranial hemorrhage. No mass lesion.
No midline shift, ventriculomegaly or extra-axial fluid collection.

Vascular: No hyperdense vessel or unexpected calcification.

Skull: Negative for fracture or focal lesion.

Sinuses/Orbits: Normal orbits. Minimal ethmoid and maxillary sinus
mucosal thickening. No mastoid effusion.

Other: None.
IMPRESSION: No acute intracranial process.

Minimal sinus disease.

## 2020-03-29 MED ORDER — DEXTROSE 5% FOR FLUSHING BEFORE AND AFTER AMBISOME
10.0000 mL | INTRAVENOUS | Status: DC
  Administered 2020-03-29 – 2020-04-10 (×14): 10 mL via INTRAVENOUS
  Filled 2020-03-29 (×16): qty 50

## 2020-03-29 MED ORDER — IBUPROFEN 200 MG PO TABS
400.0000 mg | ORAL_TABLET | ORAL | Status: DC | PRN
  Administered 2020-03-29 – 2020-04-12 (×9): 400 mg via ORAL
  Filled 2020-03-29 (×9): qty 2

## 2020-03-29 MED ORDER — REGADENOSON 0.4 MG/5ML IV SOLN
INTRAVENOUS | Status: AC
  Administered 2020-03-29: 0.4 mg via INTRAVENOUS
  Filled 2020-03-29: qty 5

## 2020-03-29 MED ORDER — MAGNESIUM SULFATE 2 GM/50ML IV SOLN
2.0000 g | Freq: Once | INTRAVENOUS | Status: AC
  Administered 2020-03-29: 2 g via INTRAVENOUS
  Filled 2020-03-29: qty 50

## 2020-03-29 MED ORDER — DEXTROMETHORPHAN POLISTIREX ER 30 MG/5ML PO SUER
30.0000 mg | Freq: Two times a day (BID) | ORAL | Status: DC
  Administered 2020-03-29 – 2020-04-19 (×43): 30 mg via ORAL
  Filled 2020-03-29 (×44): qty 5

## 2020-03-29 MED ORDER — SODIUM CHLORIDE 0.9 % IV BOLUS FOR AMBISOME
500.0000 mL | INTRAVENOUS | Status: DC
  Administered 2020-03-29 – 2020-04-02 (×5): 500 mL via INTRAVENOUS

## 2020-03-29 MED ORDER — OXYCODONE HCL 5 MG PO TABS
5.0000 mg | ORAL_TABLET | Freq: Four times a day (QID) | ORAL | Status: DC | PRN
  Administered 2020-03-29 – 2020-04-19 (×23): 5 mg via ORAL
  Filled 2020-03-29 (×22): qty 1

## 2020-03-29 MED ORDER — DEXTROSE 5% FOR FLUSHING BEFORE AND AFTER AMBISOME
10.0000 mL | INTRAVENOUS | Status: DC
  Filled 2020-03-29: qty 50

## 2020-03-29 MED ORDER — SODIUM CHLORIDE 0.9 % IV BOLUS FOR AMBISOME
500.0000 mL | INTRAVENOUS | Status: DC
  Administered 2020-03-29: 500 mL via INTRAVENOUS

## 2020-03-29 MED ORDER — TECHNETIUM TC 99M TETROFOSMIN IV KIT
30.5000 | PACK | Freq: Once | INTRAVENOUS | Status: AC | PRN
  Administered 2020-03-29: 30.5 via INTRAVENOUS

## 2020-03-29 MED ORDER — REGADENOSON 0.4 MG/5ML IV SOLN: 0.4000 mg | Freq: Once | INTRAVENOUS | Status: AC

## 2020-03-29 MED ORDER — TECHNETIUM TC 99M TETROFOSMIN IV KIT
11.0000 | PACK | Freq: Once | INTRAVENOUS | Status: AC | PRN
  Administered 2020-03-29: 11 via INTRAVENOUS

## 2020-03-29 MED ORDER — DEXTROSE 5% FOR FLUSHING BEFORE AND AFTER AMBISOME
10.0000 mL | INTRAVENOUS | Status: DC
  Administered 2020-03-29 – 2020-04-10 (×17): 10 mL via INTRAVENOUS
  Filled 2020-03-29 (×15): qty 50

## 2020-03-29 NOTE — Procedures (Signed)
LUMBAR PUNCTURE (SPINAL TAP) PROCEDURE NOTE  Indication:  Cryptococcal meningitis   Proceduralists: Amie Portland MD Assistant: Forrest Moron, NP   Risks of the procedure were dicussed with the patient including post-LP headache, bleeding, infection, weakness/numbness of legs(radiculopathy), death.   Consent obtained from: Patient, witnessed by nurse.   Procedure Note The patient was prepped and draped, and using sterile technique a 20 gauge quinke spinal needle was inserted in the L4-5 space.   Opening pressure was 26 cm H2O.    Closing pressure was not obtained because of the monometer at that time being placed out of the sterile field. Approximately 15cc of CSF were obtained and sent for analysis.  Mildly turbid color of the fluid. Patient tolerated the procedure well and blood loss was minimal. Instructions were provided to lay flat for 2 hours post procedure.  CSF sample was walked to the lab.  -- Amie Portland, MD Neurologist [facility] Pager: [phone]

## 2020-03-29 NOTE — Progress Notes (Signed)
[facility] for Infectious Disease    Date of Admission:  03/26/2020   Total days of antibiotics 4/L-ampho & flucytosine           ID: Ricky [name] is a 65 y.o. 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 Hx of covid with LUL infiltrate on CT found to have cavitary nodular lesions with bronchiectasis *Principal Problem:   Cryptococcal meningitis (HCC) Active Problems:   Leukocytopenia   Macrocytosis   Moderate protein malnutrition (HCC)   Hypoalbuminemia   Human immunodeficiency virus (HIV) disease (HCC)   Atrial flutter (HCC)   Aortic atherosclerosis (HCC)   Hypertension   SVT (supraventricular tachycardia) (HCC)   Abnormal EKG   Acute intractable headache    Subjective: Still has significant frontal headache this morning, intermittent. Worse with coughing as well  No fevers.  Medications:  . azithromycin  1,200 mg Oral Weekly  . dextromethorphan  30 mg Oral BID  . dextrose  10 mL Intravenous Q24H  . dextrose  10 mL Intravenous Q24H  . diltiazem  30 mg Oral Q8H  . flucytosine  25 mg/kg Oral Q6H  . sodium chloride  500 mL Intravenous Q24H  . sodium chloride  500 mL Intravenous Q24H  . sulfamethoxazole-trimethoprim  1 tablet Oral Daily    Objective: Vital signs in last 24 hours: Temp:  [98.1 F (36.7 C)-100.3 F (37.9 C)] 98.6 F (37 C) (02/24 1113) Pulse Rate:  [55-79] 64 (02/24 1113) Resp:  [16-18] 16 (02/24 1113) BP: (116-148)/(75-99) 116/80 (02/24 1113) SpO2:  [93 %-100 %] 97 % (02/24 1113) Physical Exam  Constitutional: He is oriented to person, place, and time. He appears chronically ill and well-nourished. No distress.  HENT:  Mouth/Throat: Oropharynx is clear and moist. No oropharyngeal exudate.  Cardiovascular: Normal rate, regular rhythm and normal heart sounds. Exam reveals no gallop and no friction rub.  No murmur heard.  Pulmonary/Chest: Effort normal and  breath sounds normal. No respiratory distress. He has no wheezes.  Abdominal: Soft. Bowel sounds are normal. He exhibits no distension. There is no tenderness.  Lymphadenopathy:  He has no cervical adenopathy.  Neurological: He is alert and oriented to person, place, and time.  Skin: Skin is warm and dry. No rash noted. No erythema.  Psychiatric: He has a normal mood and affect. His behavior is normal.     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 EXAM: CT CHEST WITH CONTRAST TECHNIQUE: Multidetector CT imaging of the chest was performed during intravenous contrast administration. CONTRAST:  60mL OMNIPAQUE IOHEXOL 300 MG/ML  SOLN COMPARISON:  None. FINDINGS: Cardiovascular: Atherosclerotic calcification of the aorta. Heart is at the upper limits of normal to mildly enlarged with left ventricular  dilatation. No pericardial effusion. Mediastinum/Nodes: Mediastinal lymph nodes measure up to 12 mm in the low left paratracheal station. Hilar lymph nodes measure up to 11 mm on the left. Subcarinal lymph nodes measure up to 1.7 cm. No axillary adenopathy. Periesophageal lymph nodes measure up to 12 mm (3/62). There may be slight distal esophageal wall thickening. Lungs/Pleura: Centrilobular and paraseptal emphysema. Nodular and cavitary areas of airspace consolidation in the left upper and left lower lobes with areas of bronchiectasis and volume loss. Minimal dependent atelectasis in the right lower lobe.  No pleural fluid. Airway is otherwise unremarkable. Upper Abdomen: Visualized portions of the liver and adrenal glands are unremarkable. Scattered scarring in the kidneys. Subcentimeter low-attenuation lesions in the kidneys are too small to characterize. Spleen is unremarkable. Chronic calcific pancreatitis. There may be a small hiatal hernia. Visualized portions of the stomach and bowel are otherwise grossly unremarkable. Upper abdominal lymph nodes measure up to 12 mm in the periportal region. Musculoskeletal: No worrisome lytic or sclerotic lesions. Disc calcification is seen at T10-11. IMPRESSION: 1. Nodular and cavitary airspace consolidation in the left upper and left lower lobes, [name], possibly reactive. Again, [name], as clinically indicated. 4. Areas of volume loss and bronchiectasis in the left upper and left lower lobes may be due to resolving infection/post infectious scarring. 5. Chronic calcific pancreatitis. 6.  Aortic atherosclerosis (ICD10-I70.0). 7.  Emphysema (ICD10-J43.9). Electronically Signed   By: Lorin Picket M.D.   On: 03/28/2020 08:22   NM Myocar Multi W/Spect W/Wall Motion / EF  Result Date: 03/29/2020  There was no ST segment deviation noted during stress.  The study is normal.  The left ventricular ejection fraction is mildly decreased (45-54%).  Normal pharmacologic nuclear stress test with no evidence for prior infarct or ischemia. LVEF 51%.  ECHOCARDIOGRAM COMPLETE  Result Date: 03/27/2020    ECHOCARDIOGRAM REPORT   Patient Name:   Ricky [name] Date of Exam: 03/27/2020 Medical Rec #:  [id number]      Height:       73.0 in Accession #:    [id number]     Weight:       178.1 lb Date of Birth:  1955/02/19      BSA:          2.048 m Patient Age:    89 years       BP:           142/70 mmHg Patient Gender: M              HR:           56 bpm. Exam Location:  Inpatient Procedure: 2D Echo, Cardiac Doppler and Color Doppler Indications:    Atrial flutter  History:        Patient has no prior history of Echocardiogram examinations.                 Arrythmias:Atrial Flutter; Risk Factors:Hypertension. HIV+.  Sonographer:    Dustin Flock Referring Phys: [id number] [place]  1. Left ventricular ejection fraction, by estimation, is 50 to 55%. The left ventricle has low normal function. The left ventricle has no regional wall motion abnormalities. There is mild concentric left ventricular hypertrophy. Left ventricular diastolic parameters are indeterminate.  2. Right ventricular  systolic function is normal. The right ventricular size is normal.  3. Left atrial size was mildly dilated.  4. Right atrial size was mildly dilated.  5. The mitral valve is normal in structure. Trivial mitral valve regurgitation. No evidence of mitral stenosis.  6. The aortic valve is tricuspid. Aortic valve regurgitation is not visualized. No aortic stenosis is present.  7. The inferior vena cava is normal in size with greater than 50% respiratory variability, suggesting right atrial pressure of 3 mmHg. Comparison(s): No prior Echocardiogram. Conclusion(s)/Recommendation(s): Normal biventricular function without evidence of hemodynamically significant valvular heart disease. FINDINGS  Left Ventricle: Left ventricular ejection fraction, by estimation, is 50 to 55%. The left ventricle has low normal function. The left ventricle has no regional wall motion abnormalities. The left ventricular internal cavity size was normal in size. There is mild concentric left ventricular hypertrophy. Left ventricular diastolic parameters are indeterminate. Right Ventricle: The right ventricular size is normal. Right vetricular wall thickness was  not well visualized. Right ventricular systolic function is normal. Left Atrium: Left atrial size was mildly dilated. Right Atrium: Right atrial size was mildly dilated. Pericardium: There is no evidence of pericardial effusion. Mitral Valve: The mitral valve is normal in structure. Trivial mitral valve regurgitation. No evidence of mitral valve stenosis. Tricuspid Valve: The tricuspid valve is normal in structure. Tricuspid valve regurgitation is not demonstrated. No evidence of tricuspid stenosis. Aortic Valve: The aortic valve is tricuspid. Aortic valve regurgitation is not visualized. No aortic stenosis is present. Pulmonic Valve: The pulmonic valve was grossly normal. Pulmonic valve regurgitation is not visualized. No evidence of pulmonic stenosis. Aorta: The aortic root, ascending aorta and aortic arch are all structurally normal, with no evidence of dilitation or obstruction. Venous: The inferior vena cava is normal in size with greater than 50% respiratory variability, suggesting right atrial pressure of 3 mmHg. IAS/Shunts: The atrial septum is grossly normal.  LEFT VENTRICLE PLAX 2D LVIDd:         5.00 cm  Diastology LVIDs:         3.80 cm  LV e' medial:    5.77 cm/s LV PW:         1.20 cm  LV E/e' medial:  14.8 LV IVS:        1.30 cm  LV e' lateral:   7.51 cm/s LVOT diam:     2.70 cm  LV E/e' lateral: 11.4 LV SV:         121 LV SV Index:   59 LVOT Area:     5.73 cm  RIGHT VENTRICLE RV Basal diam:  3.00 cm RV S prime:     13.20 cm/s TAPSE (M-mode): 2.6 cm LEFT ATRIUM             Index       RIGHT ATRIUM           Index LA diam:        3.20 cm 1.56 cm/m  RA Area:     15.60 cm LA Vol (A2C):   42.5 ml 20.75 ml/m RA Volume:   39.30 ml  19.19 ml/m LA Vol (A4C):   44.2 ml 21.58 ml/m LA Biplane Vol: 44.9 ml 21.92 ml/m  AORTIC VALVE LVOT Vmax:   99.80 cm/s LVOT Vmean:  67.700 cm/s LVOT VTI:    0.211 m  AORTA Ao Root diam: 3.20 cm MITRAL VALVE MV Area (PHT): 3.72 cm    SHUNTS MV Decel Time: 204 msec     Systemic  VTI:  0.21 m MV E velocity: 85.30 cm/s  Systemic Diam: 2.70 cm MV A velocity: 70.70 cm/s MV E/A ratio:  1.21 Buford Dresser MD Electronically signed by Buford Dresser MD Signature Date/Time: 03/27/2020/7:28:47 PM    Final      Assessment/Plan: hiv disease = awaiting for him to nearly complete induction then to start HIV treatment. In the meantime, will continue on oi proph with bactrim plus azithromycin  Cryptococcal meningitis = currently on day 4 of 14. Continue to replete electrolytes. OP on admit was 27. Recommend to repeat LP since he still has worsening headache, concerning for high OP. Appreciate neurology consult/LP.  Thrush = being treated with antifungal  Underweight/loss of appetite = consider appetite stimulant like remeron  Bronchiectasis/cavitary-nodular pattern = have RT try to do another specimen collection for both aerobic/AFB/fungal culture.   [facility] Cell: [phone] Pager: [phone]  03/29/2020, 2:00 PM

## 2020-03-29 NOTE — Progress Notes (Signed)
Primary hospitalist regarding patient's complaints of headache-has a possible pneumonia and cough and headache gets worse whenever he coughs. Concern for increased ICP and need for LP. I have ordered a PT/INR Have ordered CT head After both those become available, will evaluate the patient and likely perform LP bedside.  -- Amie Portland, MD Neurologist [facility] Pager: [phone]

## 2020-03-29 NOTE — Plan of Care (Signed)

## 2020-03-29 NOTE — Progress Notes (Addendum)
Neurology progress note   Subjective: No acute overnight events. Patient evaluated at bedside this morning, no family present in the room. Patient complains of sinus-like headache started behind eyes spreading to anterior half of head. States he was doing fine Monday Tuesday but yesterday started having this headache. He notes it was 10/10 (10 being the worst) in the morning today but now 7/10 after pain medication. Cough and light exacerbates the headache. Earlier this morning when he tried to get up from the bed, he felt extreme dizziness. He denies any nausea but states he has no appetite because of his headache. Of note: Informed about CT head and lumbar puncture later in the afternoon and he agrees with the plan.  Objective: Current vital signs: BP 116/80 (BP Location: Left Arm)   Pulse 64   Temp 98.6 F (37 C) (Oral)   Resp 16   Ht 6\' 1"  (1.854 m)   Wt 80.8 kg   SpO2 97%   BMI 23.50 kg/m  Vital signs in last 24 hours: Temp:  [98.1 F (36.7 C)-100.3 F (37.9 C)] 98.6 F (37 C) (02/24 1113) Pulse Rate:  [55-79] 64 (02/24 1113) Resp:  [16-18] 16 (02/24 1113) BP: (116-148)/(75-99) 116/80 (02/24 1113) SpO2:  [93 %-100 %] 97 % (02/24 1113)  Physical exam General: NAD HEENT-  Normocephalic, no lesions, without obvious abnormality.   Cardiovascular- S1-S2 audible, regular rate and rhythm Lungs-respiratory effort Abdomen-soft, nontender, nondistended Musculoskeletal-no joint tenderness, deformity or swelling Skin-warm and dry  Neurologic Exam: Mental Status: Alert, oriented, thought content appropriate.  Speech fluent without evidence of aphasia.  Able to follow 3 step commands without difficulty. Cranial Nerves: II:  Visual fields grossly normal, pupils equal, round, reactive to light and accommodation III,IV, VI: ptosis not present, extra-ocular motions intact bilaterally V,VII: smile symmetric, facial light touch sensation  normal bilaterally VIII: hearing normal bilaterally IX,X: uvula rises symmetrically XI: bilateral shoulder shrug XII: midline tongue extension without atrophy or fasciculations  Motor:5/5 in all muscle groups. Tone and bulk:normal tone throughout; no atrophy noted Sensory: Pinprick and light touch intact throughout, bilaterally Deep Tendon Reflexes: 2+ UE. 1+ LE Plantars: Right: downgoing   Left: downgoing Cerebellar: normal finger-to-nose bilaterally Gait: Deferred  Lab Results: Basic Metabolic Panel: Recent Labs  Lab 03/26/20 0958 03/26/20 1829 03/27/20 0336 03/28/20 0428 03/29/20 0255  NA 135  --  131* 133* 131*  K 4.2  --  4.0 4.2 4.0  CL 101  --  98 99 96*  CO2 25  --  20* 25 27  GLUCOSE 117*  --  234* 115* 105*  BUN 12  --  10 13 8   CREATININE 1.16  --  1.09 0.96 1.06  CALCIUM 8.6*  --  8.6* 8.6* 9.0  MG  --  1.8 2.1 1.9 1.9  PHOS  --  2.9  --   --   --    CBC: Recent Labs  Lab 03/26/20 0958 03/27/20 0336  WBC 3.7* 5.4  NEUTROABS 2.2  --   HGB 13.9 14.3  HCT 42.3 42.4  MCV 101.2* 99.8  PLT 325 324   Imaging: CT Venogram 03/26/2020 IMPRESSION: 1. Small focal hypodensity along the superior aspect of the superior sagittal sinus, possibly extradural in location and not typical in appearance/morphology for acute thrombus. Per Dr. Sherry Ruffing an MRI with and without contrast has already been ordered, which will further evaluate. 2. Ethmoid air cell mucosal thickening without air-fluid levels.  MR Brain w wo contrast 03/26/2020 IMPRESSION: 1. No  evidence of acute nonvascular abnormality. 2. The area of abnormality seen on same-day CTV is poorly characterized on this study secondary to motion and flow related artifact. While the appearance on the CTV was atypical in appearance/morphology for acute thrombus, a follow up CTV in approximately one week could evaluate for change if the patient's symptoms do not improve or worsen.  CT Head wo  contrast 2/24/20222  Pending  Medications:  Current Outpatient Medications  Medication Instructions  . amoxicillin-clavulanate (AUGMENTIN) 875-125 MG tablet 1 tablet, Oral, 2 times daily, One po bid x 7 days  . benzonatate (TESSALON) 100 mg, Oral, Every 8 hours    Assessment: 65 year old with h/o HIV for over 20 years admitted after progressive, constant headache for 2 to 3 weeks and admitted with cryptococcal meningitis s/p LP on 03/27/2020. -Patient notes to have headache started yesterday, started behind eyes and aching pain spreading to head. Had improvement in headache on 2/22 and 2/23. On examination today, patient is alert awake and oriented. Photophobia+. No other neurological abnormality noted. -MRI brain on 03/26/2020 did not show any evidence of acute nonvascular abnormality.  CT venogram with small focal hypodensity along the superior aspect of sagittal sinus, possibly extradural in location and not typical in appearance for acute thrombus.  Low suspicion for acute thrombus at this time. -PT 13.5, INR 1.1 on 2/24.  Lumbar puncture on 03/27/2020 elevated pressure, +CrAg, titer of 1: 2560.  CSF fluid analysis showed WBC of 17, low glucose of 37, and protein of 140.  CSF smear + cryptococcus neoformans. -ID on board for treatment of cryptococcal meningitis with antifungals.  Currently on 4th day of 14 for induction of L-ampho and flucytosine.  Headache likely secondary to cryptococcal meningitis and increased ICP.  Impression: -Cryptococcal meningitis -Hypoalbuminemia, moderate protein malnutrition -HIV, was previously on medication, awaiting t/t of CM.   Recommendations: -Follow-up CT head -Orthostatic vital signs as c/o lightheadedness/dizziness. -Lumbar puncture today's afternoon at bedside for the concern of increased ICP -Continue antifungal treatment as per ID and primary team -Neurology will be available for any further questions.  Honor Junes, MD PGY-1,  Resident  03/29/2020, 2:13 PM    Attending addendum Patient seen and examined independently. Exam essentially unremarkable or normal. Reports of headache, but some positional component and especially exacerbated by coughing and heavy breathing. We will benefit from an LP along with measurement of opening pressure We will perform at bedside. Patient consented for the procedure. Please see the procedure note.  -- Amie Portland, MD Neurologist [facility] Pager: [phone]

## 2020-03-29 NOTE — Progress Notes (Signed)
Progress Note  Patient Name: Ricky [name] Date of Encounter: 03/29/2020  Attending physician: Debbe Odea, MD Primary care provider: Pcp, No Primary Cardiologist: NA Consultant:Sunit Terri Skains, DO  Subjective: Ricky [name] is a 65 y.o. male who was seen and examined prior to his nuclear stress test No events overnight. Had a brief episode of SVT. At the time of evaluation patient is sinus bradycardia Denies chest pain or shortness of breath.  Objective: Vital Signs in the last 24 hours: Temp:  [97.8 F (36.6 C)-100.3 F (37.9 C)] 97.8 F (36.6 C) (02/24 1500) Pulse Rate:  [55-79] 58 (02/24 1500) Resp:  [16-18] 16 (02/24 1500) BP: (116-146)/(75-99) 132/76 (02/24 1500) SpO2:  [93 %-100 %] 99 % (02/24 1500)  Intake/Output:  Intake/Output Summary (Last 24 hours) at 03/29/2020 1559 Last data filed at 03/29/2020 1030 Gross per 24 hour  Intake 480 ml  Output 3765 ml  Net -3285 ml    Net IO Since Admission: -6,690.44 mL [03/29/20 1559]  Weights:  Filed Weights   03/26/20 1839 03/27/20 0141  Weight: 80 kg 80.8 kg    Telemetry: Personally reviewed.  Sinus mechanism with rare brief SVT episodes.  Physical examination: PHYSICAL EXAM: Vitals with BMI 03/29/2020 03/29/2020 03/29/2020  Height - - -  Weight - - -  BMI - - -  Systolic 510 258 527  Diastolic 76 80 81  Pulse 58 64 70    CONSTITUTIONAL: Appears older than stated age, hemodynamically stable, no acute distress.   SKIN: Skin is warm and dry. No rash noted. No cyanosis. No pallor. No jaundice HEAD: Normocephalic and atraumatic.  EYES: No scleral icterus MOUTH/THROAT: Dry oral membranes.  NECK: No JVD present. No thyromegaly noted. No carotid bruits  LYMPHATIC: No visible cervical adenopathy.  CHEST Normal respiratory effort. No intercostal retractions  LUNGS: Decreased breath sounds at the bases.  Rhonchi noted left greater than right.  No rales CARDIOVASCULAR: Regular, positive S1-S2, no murmurs rubs or  gallops appreciated ABDOMINAL: No apparent ascites.  EXTREMITIES: No peripheral edema  HEMATOLOGIC: No significant bruising NEUROLOGIC: Oriented to person, place, and time. Nonfocal. Normal muscle tone.  PSYCHIATRIC: Normal mood and affect. Normal behavior. Cooperative   Lab Results: Hematology Recent Labs  Lab 03/26/20 0958 03/27/20 0336  WBC 3.7* 5.4  RBC 4.18* 4.25  HGB 13.9 14.3  HCT 42.3 42.4  MCV 101.2* 99.8  MCH 33.3 33.6  MCHC 32.9 33.7  RDW 12.1 11.8  PLT 325 324    Chemistry Recent Labs  Lab 03/27/20 0336 03/28/20 0428 03/29/20 0255  NA 131* 133* 131*  K 4.0 4.2 4.0  CL 98 99 96*  CO2 20* 25 27  GLUCOSE 234* 115* 105*  BUN 10 13 8   CREATININE 1.09 0.96 1.06  CALCIUM 8.6* 8.6* 9.0  PROT 7.6 7.1 7.7  ALBUMIN 3.0* 2.9* 3.0*  AST 30 25 27   ALT 18 16 17   ALKPHOS 95 92 93  BILITOT 1.0 0.9 1.0  GFRNONAA >60 >60 >60  ANIONGAP 13 9 8      Cardiac Enzymes: Cardiac Panel (last 3 results) No results for input(s): CKTOTAL, CKMB, TROPONINIHS, RELINDX in the last 72 hours.  BNP (last 3 results) No results for input(s): BNP in the last 8760 hours.  ProBNP (last 3 results) No results for input(s): PROBNP in the last 8760 hours.   DDimer No results for input(s): DDIMER in the last 168 hours.   Hemoglobin A1c: No results found for: HGBA1C, MPG  TSH  Recent Labs  03/27/20 0949  TSH 0.628    Lipid Panel     Component Value Date/Time   CHOL 141 03/29/2020 0255   TRIG 118 03/29/2020 0255   HDL 31 (L) 03/29/2020 0255   CHOLHDL 4.5 03/29/2020 0255   VLDL 24 03/29/2020 0255   LDLCALC 86 03/29/2020 0255    Imaging: CT CHEST W CONTRAST  Result Date: 03/28/2020 CLINICAL DATA:  Persistent cough. EXAM: CT CHEST WITH CONTRAST TECHNIQUE: Multidetector CT imaging of the chest was performed during intravenous contrast administration. CONTRAST:  39mL OMNIPAQUE IOHEXOL 300 MG/ML  SOLN COMPARISON:  None. FINDINGS: Cardiovascular: Atherosclerotic  calcification of the aorta. Heart is at the upper limits of normal to mildly enlarged with left ventricular dilatation. No pericardial effusion. Mediastinum/Nodes: Mediastinal lymph nodes measure up to 12 mm in the low left paratracheal station. Hilar lymph nodes measure up to 11 mm on the left. Subcarinal lymph nodes measure up to 1.7 cm. No axillary adenopathy. Periesophageal lymph nodes measure up to 12 mm (3/62). There may be slight distal esophageal wall thickening. Lungs/Pleura: Centrilobular and paraseptal emphysema. Nodular and cavitary areas of airspace consolidation in the left upper and left lower lobes with areas of bronchiectasis and volume loss. Minimal dependent atelectasis in the right lower lobe. No pleural fluid. Airway is otherwise unremarkable. Upper Abdomen: Visualized portions of the liver and adrenal glands are unremarkable. Scattered scarring in the kidneys. Subcentimeter low-attenuation lesions in the kidneys are too small to characterize. Spleen is unremarkable. Chronic calcific pancreatitis. There may be a small hiatal hernia. Visualized portions of the stomach and bowel are otherwise grossly unremarkable. Upper abdominal lymph nodes measure up to 12 mm in the periportal region. Musculoskeletal: No worrisome lytic or sclerotic lesions. Disc calcification is seen at T10-11. IMPRESSION: 1. Nodular and cavitary airspace consolidation in the left upper and left lower lobes, [name], possibly reactive. Again, [name], as clinically indicated. 4. Areas of volume loss and bronchiectasis in the left upper and left lower lobes may be due to resolving infection/post infectious scarring. 5. Chronic calcific pancreatitis. 6.  Aortic  atherosclerosis (ICD10-I70.0). 7.  Emphysema (ICD10-J43.9). Electronically Signed   By: Lorin Picket M.D.   On: 03/28/2020 08:22   NM Myocar Multi W/Spect W/Wall Motion / EF  Result Date: 03/29/2020  There was no ST segment deviation noted during stress.  The study is normal.  The left ventricular ejection fraction is mildly decreased (45-54%).  Normal pharmacologic nuclear stress test with no evidence for prior infarct or ischemia. LVEF 51%.   Cardiac database: EKG: 03/03/2020 00:06: Normal sinus rhythm, 85 bpm, normal axis, borderline repolarization abnormality, without underlying injury pattern.  03/27/2020 at 03:18: Supraventricular tachycardia, 175 bpm, normal axis, LVH per voltage criteria, ST-T changes most likely secondary to repolarization abnormality.  Compared to prior EKG normal sinus rhythm has transition to SVT.  03/27/2020 at 3:19: Supraventricular tachycardia 179 bpm, LVH per voltage criteria, ST-T changes most likely secondary to repolarization abnormality.   03/27/2020 at 20:17: Supraventricular tachycardia 139 bpm, LVH per voltage criteria, ST-T changes most likely secondary to repolarization abnormality and rate dependent.  Echocardiogram: 03/28/2020: 1. Left ventricular ejection fraction, by estimation, is 50 to 55%. The  left ventricle has low normal function. The left ventricle has no regional  wall motion abnormalities. There  is mild concentric left ventricular  hypertrophy. Left ventricular  diastolic parameters are indeterminate.  2. Right ventricular systolic function is normal. The right ventricular  size is normal.  3. Left atrial size was mildly dilated.  4. Right atrial size was mildly dilated.  5. The mitral valve is normal in structure. Trivial mitral valve  regurgitation. No evidence of mitral stenosis.  6. The aortic valve is tricuspid. Aortic valve regurgitation is not  visualized. No aortic stenosis is present.  7. The inferior vena  cava is normal in size with greater than 50%  respiratory variability, suggesting right atrial pressure of 3 mmHg.  Scheduled Meds: . azithromycin  1,200 mg Oral Weekly  . dextromethorphan  30 mg Oral BID  . dextrose  10 mL Intravenous Q24H  . dextrose  10 mL Intravenous Q24H  . diltiazem  30 mg Oral Q8H  . flucytosine  25 mg/kg Oral Q6H  . sodium chloride  500 mL Intravenous Q24H  . sodium chloride  500 mL Intravenous Q24H  . sulfamethoxazole-trimethoprim  1 tablet Oral Daily    Continuous Infusions: . amphotericin  B  Liposome (AMBISOME) ADULT IV 300 mg (03/28/20 1701)    PRN Meds: acetaminophen **OR** acetaminophen, acetaminophen, diphenhydrAMINE **OR** diphenhydrAMINE, ibuprofen, ondansetron **OR** ondansetron (ZOFRAN) IV, oxyCODONE   IMPRESSION & RECOMMENDATIONS: DEMTRIUS ROUNDS is a 65 y.o. male whose past medical history and cardiac risk factors include: HIV not on antiretroviral medications, noncompliance, cigarette smoking, recent COVID-19 infection (January 2022), hypertension, protein calorie malnutrition, aortic atherosclerosis, cavitary consolidation in the left lung fields on recent CT imaging, emphysema.  Supraventricular tachycardia: Patient has been having burst of supraventricular tachycardia during his hospitalization which may be secondary to his underlying meningitis and his underlying immunocompromise state which predisposes him to opportunistic infections.  Given his underlying SVT, echo noting biatrial enlargement, he is at risk of developing atrial flutter/fibrillation.  Would recommend outpatient monitor for further evaluation and management.    Patient has been started on Cardizem 30 mg p.o. 3 times daily and is tolerating well.  His ventricular rate has improved over the last 24 hours.  Max heart rate 79 bpm.  He had a brief episode of SVT at night.  Continue telemetry and monitor  As noted before his episodes of SVT/tachycardia may be associated due to his  underlying infectious process such as meningitis, nodular/cavitary air space consolidation in the left lung fields, and 8.  Abnormal EKG: Multiple EKG shows ST T changes which are most likely secondary to LVH and rate related.  However, given his multiple cardiovascular risk factors, is less likely ability to follow-up as an outpatient, and given his history of HIV/AIDS which predisposes to CAD nuclear stress test was ordered today to rule out reversible ischemia.  As per results, no evidence of prior infarct or ischemia.  Aortic atherosclerosis as per CT imaging: Recommend aspirin 81 mg p.o. daily (when cleared by neurology given the recent LPs) and statin therapy.   Will defer the initiation of statin therapy to primary team after his antiretroviral therapy is determined to decrease drug to drug interactions.  Benign essential hypertension: Currently managed by primary team.  Cigarette smoking: Educated on importance of complete smoking cessation.  Cocaine use: Last use was 3 weeks ago.  Recent drug screen negative for cocaine.  Therefore transition him from beta-blocker therapy to calcium channel blockers.  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 Medical noncompliance  We will sign off for now.  However, please do not hesitate to reach out if any questions or concerns arise.  I will schedule an outpatient follow-up appointment and update his discharge instructions.  He is recommended to follow-up for further evaluation and management.  Patient's questions and concerns were addressed to his satisfaction. He voices understanding of the instructions provided during this encounter.   This note was created using a voice recognition software as a result there may be grammatical errors inadvertently enclosed that do not reflect the nature of this encounter. Every attempt is made to correct such errors.  Total time spent: 35 minutes  Sunit Chapman, DO,  [facility] [place] Office: [phone] 03/29/2020, 3:59 PM

## 2020-03-29 NOTE — Progress Notes (Signed)
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 Patient started on amphotericin B and flucytosine 2/21.   CD4 count <35; VL 25,300 (2/23)   Scr 1.06 K 4  Mg 1.9- scheduled for 2g IV   Antimicrobials: - amphotericin B 2/21-c  - flucytosine 2/21-c - Bactrim SS daily for PJP prophylaxis  - Azithromycin 1,200 mg weekly for MAC prophylaxis   Plan: - continue amphotericin B and flucytosine for 2 weeks of induction (stop date 04/09/20); adjustments per ID team  - Continue 500 mL NS fluid boluses pre- and post- amphotericin B administration- Scr stable, no additional fluids required at this time  - monitor electrolytes (K, Mg) daily and supplement as needed; if patient does not respond to 2g Mg supplementation, increase to 4g IV for Mg<2 - monitor Scr daily - Monitor CBC Q72H for signs of neutropenia  - hold off on antiretroviral therapy due to IRIS risk  - Follow-up  AFB smear   Authorizing provider: Dr. Carlyle Basques, MD   Adria Dill, PharmD-Candidate  03/29/20 Infectious Diseases Consult Service

## 2020-03-29 NOTE — Plan of Care (Signed)

## 2020-03-29 NOTE — Progress Notes (Signed)
PROGRESS NOTE    Ricky [name]   VZC:[id number]  DOB: 27-Dec-1955  DOA: 03/26/2020 PCP: Merryl Hacker, No   Brief Narrative:  Ricky [name] is a 65 year old male with HIV who presented to the hospital with multiple complaints including dizziness, blurred vision, sinus pressure and photophobia.  To above-mentioned symptoms and exam findings, there was a suspicion that the patient may have meningitis and thus an LP was performed in the ED. 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 These findings specifically included a positive cryptococcal antigen and a cryptococcal antigen titer of 2560.  Opening pressure was 27 and closing pressure was 21.  6 cc of CSF was removed. Patient was started on amphotericin B, methylprednisolone and flucytosine in the ED.  Also, in the ED, he was found to have SVT and started on a Cardizem infusion. He was leukopenic with a WBC count of 3.7.  Subjective: He states he still has a headache which is worse with coughing. Pain is severe enough that he does not want to eat.    Assessment & Plan:   Principal Problem:   Cryptococcal meningitis  -Continue antifungal treatments per ID -I have ordered neuro checks every 4 hours to assess if his symptoms recur as he may need further drainage of CSF - as headache appears worse and persistent, will ask for another LP- I have spoken with neurology about this  Active Problems:    SVT - recurrent - spontaneously converted in ED -He believes he has a prior history of this and thinks he was on medication for it -TSH checked and is within normal limits -2D echo has been ordered and results reveal a normal EF, undetermined diastolic parameters and dilatation of both atria - Have not ordered any anticoagulation as of yet - CHA2DS2-VASc Score is 1 - As he had another episode of SVT on 2/22  - consulted cardiology for an opinion on anticoagulation- holding off on anticoagulation as he has been noncompliant in the  past- also, due to possibility for recurrent LPs while receiving treatment for Cryptococcal meningitis  - Cardizem being used to prevent recurrent episodes  LUL infiltrate - he states he has a chronic cough and sputums is sometimes clear and sometimes colored -  CT chest > cavitary airspace consolidation in the left upper and left lower lobes, findings most indicative of an atypical/fungal infectious process. - sputum culture pending - add Delsym for cough supression    Moderate protein malnutrition,  Hypoalbuminemia -Continue dietary supplements Body mass index is 23.5 kg/m.     Hypertension - BP elevated-  Using Lopressor for SVT and HTN    AIDS -Patient states that he was previously on medications but it has been over a year since he has been on them-at this time he does not have a PCP and does not follow-up with an ID physician - appreciate ID assistance   CD4 absolute   <35     Total lymphocyte count   857    CD4%   2    CD8tox   56    CD8 T Cell Abs   482    Ratio   NOT CA...          Time spent in minutes: 35 DVT prophylaxis: SCDs Start: 03/26/20 1946 Code Status: stable Family Communication:  Level of Care: Level of care: Progressive Disposition Plan:  Status is: Inpatient  Remains inpatient appropriate because:IV treatments appropriate due to intensity of illness or inability to take PO  Dispo: The patient is from: Home              Anticipated d/c is to: Home              Anticipated d/c date is: 3 days              Patient currently is not medically stable to d/c.   Difficult to place patient No  Consultants:   ID  Neurology Procedures:   LP Antimicrobials:  Anti-infectives (From admission, onward)   Start     Dose/Rate Route Frequency Ordered Stop   03/28/20 1000  azithromycin (ZITHROMAX) tablet 1,200 mg        1,200 mg Oral Weekly 03/28/20 0912     03/28/20 1000  sulfamethoxazole-trimethoprim (BACTRIM) 400-80 MG per tablet 1 tablet         1 tablet Oral Daily 03/28/20 0912     03/27/20 2115  amphotericin B liposome (AMBISOME) 300 mg in dextrose 5 % 500 mL IVPB  Status:  Discontinued        300 mg 287.5 mL/hr over 120 Minutes Intravenous Every 24 hours 03/26/20 2024 03/26/20 2227   03/26/20 2227  amphotericin B liposome (AMBISOME) 300 mg in dextrose 5 % 500 mL IVPB        300 mg 287.5 mL/hr over 120 Minutes Intravenous Every 24 hours 03/26/20 2227     03/26/20 1945  amphotericin B liposome (AMBISOME) 300 mg in dextrose 5 % 500 mL IVPB  Status:  Discontinued        300 mg 287.5 mL/hr over 120 Minutes Intravenous Every 24 hours 03/26/20 1850 03/26/20 2024   03/26/20 1900  flucytosine (ANCOBON) capsule 2,000 mg        25 mg/kg  80 kg Oral Every 6 hours 03/26/20 1850         Objective: Vitals:   03/29/20 0924 03/29/20 0926 03/29/20 0928 03/29/20 1113  BP: 127/84 124/82 125/81 116/80  Pulse: 75 68 70 64  Resp:    16  Temp:    98.6 F (37 C)  TempSrc:    Oral  SpO2:    97%  Weight:      Height:        Intake/Output Summary (Last 24 hours) at 03/29/2020 1236 Last data filed at 03/29/2020 1030 Gross per 24 hour  Intake 960 ml  Output 4265 ml  Net -3305 ml   Filed Weights   03/26/20 1839 03/27/20 0141  Weight: 80 kg 80.8 kg    Examination: General exam: Appears comfortable  HEENT: PERRLA, oral mucosa moist, no sclera icterus or thrush Respiratory system: Clear to auscultation. Respiratory effort normal. Cardiovascular system: S1 & S2 heard, RRR.   Gastrointestinal system: Abdomen soft, non-tender, nondistended. Normal bowel sounds. Central nervous system: Alert and oriented. No focal neurological deficits. Extremities: No cyanosis, clubbing or edema Skin: No rashes or ulcers Psychiatry:  Mood & affect appropriate.     Data Reviewed: I have personally reviewed following labs and imaging studies  CBC: Recent Labs  Lab 03/26/20 0958 03/27/20 0336  WBC 3.7* 5.4  NEUTROABS 2.2  --   HGB 13.9 14.3   HCT 42.3 42.4  MCV 101.2* 99.8  PLT 325 623   Basic Metabolic Panel: Recent Labs  Lab 03/26/20 0958 03/26/20 1829 03/27/20 0336 03/28/20 0428 03/29/20 0255  NA 135  --  131* 133* 131*  K 4.2  --  4.0 4.2 4.0  CL 101  --  98 99 96*  CO2 25  --  20* 25 27  GLUCOSE 117*  --  234* 115* 105*  BUN 12  --  10 13 8   CREATININE 1.16  --  1.09 0.96 1.06  CALCIUM 8.6*  --  8.6* 8.6* 9.0  MG  --  1.8 2.1 1.9 1.9  PHOS  --  2.9  --   --   --    GFR: Estimated Creatinine Clearance: 81.7 mL/min (by C-G formula based on SCr of 1.06 mg/dL). Liver Function Tests: Recent Labs  Lab 03/26/20 0958 03/27/20 0336 03/28/20 0428 03/29/20 0255  AST 27 30 25 27   ALT 16 18 16 17   ALKPHOS 104 95 92 93  BILITOT 1.1 1.0 0.9 1.0  PROT 7.3 7.6 7.1 7.7  ALBUMIN 2.9* 3.0* 2.9* 3.0*   No results for input(s): LIPASE, AMYLASE in the last 168 hours. No results for input(s): AMMONIA in the last 168 hours. Coagulation Profile: No results for input(s): INR, PROTIME in the last 168 hours. Cardiac Enzymes: No results for input(s): CKTOTAL, CKMB, CKMBINDEX, TROPONINI in the last 168 hours. BNP (last 3 results) No results for input(s): PROBNP in the last 8760 hours. HbA1C: No results for input(s): HGBA1C in the last 72 hours. CBG: No results for input(s): GLUCAP in the last 168 hours. Lipid Profile: Recent Labs    03/29/20 0255  CHOL 141  HDL 31*  LDLCALC 86  TRIG 118  CHOLHDL 4.5   Thyroid Function Tests: Recent Labs    03/27/20 0949  TSH 0.628  FREET4 0.75   Anemia Panel: No results for input(s): VITAMINB12, FOLATE, FERRITIN, TIBC, IRON, RETICCTPCT in the last 72 hours. Urine analysis: No results found for: COLORURINE, APPEARANCEUR, LABSPEC, PHURINE, GLUCOSEU, HGBUR, BILIRUBINUR, KETONESUR, PROTEINUR, UROBILINOGEN, NITRITE, LEUKOCYTESUR Sepsis Labs: @LABRCNTIP (procalcitonin:4,lacticidven:4) ) Recent Results (from the past 240 hour(s))  CSF culture     Status: Abnormal   Collection  Time: 03/26/20  5:05 PM   Specimen: CSF; Cerebrospinal Fluid  Result Value Ref Range Status   Specimen Description CSF  Final   Special Requests NONE  Final   Gram Stain   Final    WBC PRESENT,BOTH PMN AND MONONUCLEAR CRYPTOCOCCUS NEOFORMANS CYTOSPIN SMEAR Performed at [facility], [address], [place], [place] [address]    Culture CRYPTOCOCCUS NEOFORMANS (A)  Final   Report Status 03/29/2020 FINAL  Final  Fungus Culture With Stain     Status: None (Preliminary result)   Collection Time: 03/26/20  5:05 PM   Specimen: Lumbar Puncture; Cerebrospinal Fluid  Result Value Ref Range Status   Fungus Stain Final report  Final    Comment: (NOTE) Performed At: [facility] [address] [place], [place] [id number] [name] MD OV:[id number]    Fungus (Mycology) Culture PENDING  Incomplete   Fungal Source CSF  Final    Comment: Performed at [facility], [place] [address] [place], [place] [address]  Fungus Culture Result     Status: None   Collection Time: 03/26/20  5:05 PM  Result Value Ref Range Status   Result 1 Comment  Final    Comment: (NOTE) KOH/Calcofluor preparation:  no fungus observed. Performed At: [facility] [place], [place] [id number] [name] MD BS:[id number]   Expectorated Sputum Assessment w Gram Stain, Rflx to Resp Cult     Status: None   Collection Time: 03/27/20  8:23 PM   Specimen: Sputum  Result Value Ref Range Status   Specimen Description SPUTUM  Final   Special Requests NONE  Final   Sputum evaluation   Final    Sputum specimen not acceptable for testing.  Please recollect.   RESULT CALLED TO, READ BACK BY AND VERIFIED WITH: Radene Knee RN 03/27/20 2225 JDW Performed at [facility], [place] [address], [place], [place] [address]    Report Status 03/27/2020 FINAL  Final  Expectorated Sputum Assessment w Gram Stain, Rflx to Resp Cult     Status: None   Collection Time: 03/28/20  1:45 AM   Specimen:  Sputum  Result Value Ref Range Status   Specimen Description SPUTUM  Final   Special Requests NONE  Final   Sputum evaluation   Final    Sputum specimen not acceptable for testing.  Please recollect.   RESULT CALLED TO, READ BACK BY AND VERIFIED WITH: P ASIDI RN 03/27/20 0354 JDW Performed at [facility], [place] [address], [place], [place] [address]    Report Status 03/28/2020 FINAL  Final         Radiology Studies: CT CHEST W CONTRAST  Result Date: 03/28/2020 CLINICAL DATA:  Persistent cough. EXAM: CT CHEST WITH CONTRAST TECHNIQUE: Multidetector CT imaging of the chest was performed during intravenous contrast administration. CONTRAST:  82mL OMNIPAQUE IOHEXOL 300 MG/ML  SOLN COMPARISON:  None. FINDINGS: Cardiovascular: Atherosclerotic calcification of the aorta. Heart is at the upper limits of normal to mildly enlarged with left ventricular dilatation. No pericardial effusion. Mediastinum/Nodes: Mediastinal lymph nodes measure up to 12 mm in the low left paratracheal station. Hilar lymph nodes measure up to 11 mm on the left. Subcarinal lymph nodes measure up to 1.7 cm. No axillary adenopathy. Periesophageal lymph nodes measure up to 12 mm (3/62). There may be slight distal esophageal wall thickening. Lungs/Pleura: Centrilobular and paraseptal emphysema. Nodular and cavitary areas of airspace consolidation in the left upper and left lower lobes with areas of bronchiectasis and volume loss. Minimal dependent atelectasis in the right lower lobe. No pleural fluid. Airway is otherwise unremarkable. Upper Abdomen: Visualized portions of the liver and adrenal glands are unremarkable. Scattered scarring in the kidneys. Subcentimeter low-attenuation lesions in the kidneys are too small to characterize. Spleen is unremarkable. Chronic calcific pancreatitis. There may be a small hiatal hernia. Visualized portions of the stomach and bowel are otherwise grossly unremarkable. Upper abdominal lymph nodes  measure up to 12 mm in the periportal region. Musculoskeletal: No worrisome lytic or sclerotic lesions. Disc calcification is seen at T10-11. IMPRESSION: 1. Nodular and cavitary airspace consolidation in the left upper and left lower lobes, [name], possibly reactive. Again, [name], as clinically indicated. 4. Areas of volume loss and bronchiectasis in the left upper and left lower lobes may be due to resolving infection/post infectious scarring. 5. Chronic calcific pancreatitis. 6.  Aortic atherosclerosis (ICD10-I70.0). 7.  Emphysema (ICD10-J43.9). Electronically Signed   By: Lorin Picket M.D.   On: 03/28/2020 08:22   ECHOCARDIOGRAM COMPLETE  Result Date: 03/27/2020    ECHOCARDIOGRAM REPORT   Patient Name:   DERIN GRANQUIST Date of Exam: 03/27/2020 Medical Rec #:  [id number]      Height:       73.0 in Accession #:    [id number]     Weight:  178.1 lb Date of Birth:  [birth year]     BSA:          2.048 m Patient Age:    70 years       BP:           142/70 mmHg Patient Gender: M              HR:           56 bpm. Exam Location:  Inpatient Procedure: 2D Echo, Cardiac Doppler and Color Doppler Indications:    Atrial flutter  History:        Patient has no prior history of Echocardiogram examinations.                 Arrythmias:Atrial Flutter; Risk Factors:Hypertension. HIV+.  Sonographer:    Dustin Flock Referring Phys: [id number] [place]  1. Left ventricular ejection fraction, by estimation, is 50 to 55%. The left ventricle has low normal function. The left ventricle has no regional wall motion abnormalities. There is mild concentric left ventricular hypertrophy. Left ventricular diastolic parameters are indeterminate.   2. Right ventricular systolic function is normal. The right ventricular size is normal.  3. Left atrial size was mildly dilated.  4. Right atrial size was mildly dilated.  5. The mitral valve is normal in structure. Trivial mitral valve regurgitation. No evidence of mitral stenosis.  6. The aortic valve is tricuspid. Aortic valve regurgitation is not visualized. No aortic stenosis is present.  7. The inferior vena cava is normal in size with greater than 50% respiratory variability, suggesting right atrial pressure of 3 mmHg. Comparison(s): No prior Echocardiogram. Conclusion(s)/Recommendation(s): Normal biventricular function without evidence of hemodynamically significant valvular heart disease. FINDINGS  Left Ventricle: Left ventricular ejection fraction, by estimation, is 50 to 55%. The left ventricle has low normal function. The left ventricle has no regional wall motion abnormalities. The left ventricular internal cavity size was normal in size. There is mild concentric left ventricular hypertrophy. Left ventricular diastolic parameters are indeterminate. Right Ventricle: The right ventricular size is normal. Right vetricular wall thickness was not well visualized. Right ventricular systolic function is normal. Left Atrium: Left atrial size was mildly dilated. Right Atrium: Right atrial size was mildly dilated. Pericardium: There is no evidence of pericardial effusion. Mitral Valve: The mitral valve is normal in structure. Trivial mitral valve regurgitation. No evidence of mitral valve stenosis. Tricuspid Valve: The tricuspid valve is normal in structure. Tricuspid valve regurgitation is not demonstrated. No evidence of tricuspid stenosis. Aortic Valve: The aortic valve is tricuspid. Aortic valve regurgitation is not visualized. No aortic stenosis is present. Pulmonic Valve: The pulmonic valve was grossly normal. Pulmonic valve regurgitation is not visualized. No evidence of pulmonic stenosis. Aorta: The  aortic root, ascending aorta and aortic arch are all structurally normal, with no evidence of dilitation or obstruction. Venous: The inferior vena cava is normal in size with greater than 50% respiratory variability, suggesting right atrial pressure of 3 mmHg. IAS/Shunts: The atrial septum is grossly normal.  LEFT VENTRICLE PLAX 2D LVIDd:         5.00 cm  Diastology LVIDs:         3.80 cm  LV e' medial:    5.77 cm/s LV PW:         1.20 cm  LV E/e' medial:  14.8 LV IVS:        1.30 cm  LV e' lateral:   7.51 cm/s LVOT diam:  2.70 cm  LV E/e' lateral: 11.4 LV SV:         121 LV SV Index:   59 LVOT Area:     5.73 cm  RIGHT VENTRICLE RV Basal diam:  3.00 cm RV S prime:     13.20 cm/s TAPSE (M-mode): 2.6 cm LEFT ATRIUM             Index       RIGHT ATRIUM           Index LA diam:        3.20 cm 1.56 cm/m  RA Area:     15.60 cm LA Vol (A2C):   42.5 ml 20.75 ml/m RA Volume:   39.30 ml  19.19 ml/m LA Vol (A4C):   44.2 ml 21.58 ml/m LA Biplane Vol: 44.9 ml 21.92 ml/m  AORTIC VALVE LVOT Vmax:   99.80 cm/s LVOT Vmean:  67.700 cm/s LVOT VTI:    0.211 m  AORTA Ao Root diam: 3.20 cm MITRAL VALVE MV Area (PHT): 3.72 cm    SHUNTS MV Decel Time: 204 msec    Systemic VTI:  0.21 m MV E velocity: 85.30 cm/s  Systemic Diam: 2.70 cm MV A velocity: 70.70 cm/s MV E/A ratio:  1.21 Buford Dresser MD Electronically signed by Buford Dresser MD Signature Date/Time: 03/27/2020/7:28:47 PM    Final       Scheduled Meds: . azithromycin  1,200 mg Oral Weekly  . dextromethorphan  30 mg Oral BID  . dextrose  10 mL Intravenous Q24H  . dextrose  10 mL Intravenous Q24H  . diltiazem  30 mg Oral Q8H  . flucytosine  25 mg/kg Oral Q6H  . sodium chloride  500 mL Intravenous Q24H  . sodium chloride  500 mL Intravenous Q24H  . sulfamethoxazole-trimethoprim  1 tablet Oral Daily   Continuous Infusions: . amphotericin  B  Liposome (AMBISOME) ADULT IV 300 mg (03/28/20 1701)     LOS: 3 days      Debbe Odea, MD [facility] Pager: [email or web address] 03/29/2020, 12:36 PM

## 2020-03-29 NOTE — Progress Notes (Signed)
Lab called with critical labs---Positive cryptococcal antigen titer of 640 and spinal fluid tube 1 wbc 87; tube 4  96.  Paged to notify Dr Rory Percy.

## 2020-03-30 DIAGNOSIS — E8809 Other disorders of plasma-protein metabolism, not elsewhere classified: Secondary | ICD-10-CM

## 2020-03-30 DIAGNOSIS — B451 Cerebral cryptococcosis: Secondary | ICD-10-CM | POA: Diagnosis not present

## 2020-03-30 DIAGNOSIS — I4892 Unspecified atrial flutter: Secondary | ICD-10-CM

## 2020-03-30 DIAGNOSIS — B20 Human immunodeficiency virus [HIV] disease: Secondary | ICD-10-CM | POA: Diagnosis not present

## 2020-03-30 DIAGNOSIS — N179 Acute kidney failure, unspecified: Secondary | ICD-10-CM

## 2020-03-30 DIAGNOSIS — I10 Essential (primary) hypertension: Secondary | ICD-10-CM

## 2020-03-30 LAB — CBC
HCT: 38 % — ABNORMAL LOW (ref 39.0–52.0)
Hemoglobin: 13.3 g/dL (ref 13.0–17.0)
MCH: 33.6 pg (ref 26.0–34.0)
MCHC: 35 g/dL (ref 30.0–36.0)
MCV: 96 fL (ref 80.0–100.0)
Platelets: 321 10*3/uL (ref 150–400)
RBC: 3.96 MIL/uL — ABNORMAL LOW (ref 4.22–5.81)
RDW: 11.1 % — ABNORMAL LOW (ref 11.5–15.5)
WBC: 3.9 10*3/uL — ABNORMAL LOW (ref 4.0–10.5)
nRBC: 0 % (ref 0.0–0.2)

## 2020-03-30 LAB — COMPREHENSIVE METABOLIC PANEL
ALT: 15 U/L (ref 0–44)
AST: 23 U/L (ref 15–41)
Albumin: 2.8 g/dL — ABNORMAL LOW (ref 3.5–5.0)
Alkaline Phosphatase: 82 U/L (ref 38–126)
Anion gap: 8 (ref 5–15)
BUN: 14 mg/dL (ref 8–23)
CO2: 22 mmol/L (ref 22–32)
Calcium: 8.7 mg/dL — ABNORMAL LOW (ref 8.9–10.3)
Chloride: 101 mmol/L (ref 98–111)
Creatinine, Ser: 1.16 mg/dL (ref 0.61–1.24)
GFR, Estimated: >60 mL/min (ref >60)
Glucose, Bld: 107 mg/dL — ABNORMAL HIGH (ref 70–99)
Potassium: 3.8 mmol/L (ref 3.5–5.1)
Sodium: 131 mmol/L — ABNORMAL LOW (ref 135–145)
Total Bilirubin: 1.2 mg/dL (ref 0.3–1.2)
Total Protein: 7.3 g/dL (ref 6.5–8.1)

## 2020-03-30 LAB — PATHOLOGIST SMEAR REVIEW

## 2020-03-30 LAB — MAGNESIUM: Magnesium: 2.2 mg/dL (ref 1.7–2.4)

## 2020-03-30 MED ORDER — POTASSIUM CHLORIDE CRYS ER 20 MEQ PO TBCR
40.0000 meq | EXTENDED_RELEASE_TABLET | Freq: Once | ORAL | Status: AC
  Administered 2020-03-30: 40 meq via ORAL
  Filled 2020-03-30: qty 2

## 2020-03-30 MED ORDER — SODIUM CHLORIDE 0.9 % IV BOLUS FOR AMBISOME
750.0000 mL | INTRAVENOUS | Status: DC
  Administered 2020-03-30 – 2020-04-10 (×14): 750 mL via INTRAVENOUS

## 2020-03-30 NOTE — Plan of Care (Signed)
Pt is alert and oriented. Pt c/o a HA. PRM pain meds given and it decreased the pain. Great urine output.  Problem: Education: Goal: Knowledge of General Education information will improve Description: Including pain rating scale, medication(s)/side effects and non-pharmacologic comfort measures Outcome: Progressing   Problem: Health Behavior/Discharge Planning: Goal: Ability to manage health-related needs will improve Outcome: Progressing   Problem: Clinical Measurements: Goal: Ability to maintain clinical measurements within normal limits will improve Outcome: Progressing Goal: Will remain free from infection Outcome: Progressing Goal: Diagnostic test results will improve Outcome: Progressing Goal: Respiratory complications will improve Outcome: Progressing Goal: Cardiovascular complication will be avoided Outcome: Progressing   Problem: Activity: Goal: Risk for activity intolerance will decrease Outcome: Progressing   Problem: Nutrition: Goal: Adequate nutrition will be maintained Outcome: Progressing   Problem: Coping: Goal: Level of anxiety will decrease Outcome: Progressing   Problem: Elimination: Goal: Will not experience complications related to bowel motility Outcome: Progressing Goal: Will not experience complications related to urinary retention Outcome: Progressing   Problem: Pain Managment: Goal: General experience of comfort will improve Outcome: Progressing   Problem: Safety: Goal: Ability to remain free from injury will improve Outcome: Progressing   Problem: Skin Integrity: Goal: Risk for impaired skin integrity will decrease Outcome: Progressing

## 2020-03-30 NOTE — Progress Notes (Signed)
Antibiotic Monitoring  HPI: Pt is a 37 YOM with PMH significant for HIV who came in 2/21 complaining of 2 week h/o of HA, and increasing sinus pressure, found to have cryptococcal meningitis. Patient started on amphotericin B and flucytosine 2/21.   CD4 count <35; VL 25,300 (2/23)   Scr up to 1.16- Will increase pre-fluids to 750 mL  K 3.8 - Give 40 mEQ X 1  Mg 2.2- No replacement today   Antimicrobials: - amphotericin B 2/21-c  - flucytosine 2/21-c - Bactrim SS daily for PJP prophylaxis  - Azithromycin 1,200 mg weekly for MAC prophylaxis   Plan: - continue amphotericin B and flucytosine for 2 weeks of induction (stop date 04/09/20); adjustments per ID team  - Change pre-fluid NS to 750 mL and keep post fluid at 500 mL  - Potassium 40 mEQ X 1 today  - monitor Scr daily - Monitor CBC Q72H for signs of neutropenia     Jimmy Footman, PharmD, BCPS, BCIDP Infectious Diseases Clinical Pharmacist Phone: [phone] 03/30/2020 3:00 PM

## 2020-03-30 NOTE — Progress Notes (Addendum)
Neurology progress note   Subjective: No acute overnight events.  Patient evaluated at bedside this morning, no family present in the room.  He states his headache is much better now and would put it 5/10 (10 being worse).  Still complains of photophobia and low back pain.  Denies any other complaints.  Objective: Current vital signs: BP 127/80 (BP Location: Right Arm)   Pulse (!) 59   Temp 98 F (36.7 C) (Oral)   Resp 17   Ht 6\' 1"  (1.854 m)   Wt 80.8 kg   SpO2 97%   BMI 23.50 kg/m  Vital signs in last 24 hours: Temp:  [97.7 F (36.5 C)-99.1 F (37.3 C)] 98 F (36.7 C) (02/25 0743) Pulse Rate:  [48-64] 59 (02/25 0743) Resp:  [16-18] 17 (02/25 0743) BP: (116-132)/(76-85) 127/80 (02/25 0743) SpO2:  [97 %-99 %] 97 % (02/25 0743)  Physical exam General: NAD HEENT-  Normocephalic, no lesions, without obvious abnormality.   Cardiovascular- S1-S2 audible, regular rate and rhythm Lungs-respiratory effort Abdomen-soft, nontender, nondistended Musculoskeletal-no joint tenderness, deformity or swelling Skin-warm and dry  Neurologic Exam: Mental Status: Alert, oriented, thought content appropriate.  Speech fluent without evidence of aphasia.  Able to follow 3 step commands without difficulty. Cranial Nerves: II:  Visual fields grossly normal, pupils equal, round, reactive to light and accommodation III,IV, VI: ptosis not present, extra-ocular motions intact bilaterally V,VII: smile symmetric, facial light touch sensation normal bilaterally VIII: hearing normal bilaterally IX,X: uvula rises symmetrically XI: bilateral shoulder shrug XII: midline tongue extension without atrophy or fasciculations  Motor:5/5 in all muscle groups. Tone and bulk:normal tone throughout; no atrophy noted Sensory: Pinprick and light touch intact throughout, bilaterally Deep Tendon Reflexes: 2+ UE. 1+ LE Plantars: Right: downgoing   Left:  downgoing Cerebellar: normal finger-to-nose bilaterally Gait: Deferred  Lab Results: Basic Metabolic Panel: Recent Labs  Lab 03/26/20 0958 03/26/20 1829 03/27/20 0336 03/28/20 0428 03/29/20 0255 03/30/20 0401  NA 135  --  131* 133* 131* 131*  K 4.2  --  4.0 4.2 4.0 3.8  CL 101  --  98 99 96* 101  CO2 25  --  20* 25 27 22   GLUCOSE 117*  --  234* 115* 105* 107*  BUN 12  --  10 13 8 14   CREATININE 1.16  --  1.09 0.96 1.06 1.16  CALCIUM 8.6*  --  8.6* 8.6* 9.0 8.7*  MG  --  1.8 2.1 1.9 1.9  --   PHOS  --  2.9  --   --   --   --    CBC: Recent Labs  Lab 03/26/20 0958 03/27/20 0336 03/30/20 0401  WBC 3.7* 5.4 3.9*  NEUTROABS 2.2  --   --   HGB 13.9 14.3 13.3  HCT 42.3 42.4 38.0*  MCV 101.2* 99.8 96.0  PLT 325 324 321   Imaging: CT Venogram 03/26/2020 IMPRESSION: 1. Small focal hypodensity along the superior aspect of the superior sagittal sinus, possibly extradural in location and not typical in appearance/morphology for acute thrombus. Per Dr. Sherry Ruffing an MRI with and without contrast has already been ordered, which will further evaluate. 2. Ethmoid air cell mucosal thickening without air-fluid levels.  MR Brain w wo contrast 03/26/2020 IMPRESSION: 1. No evidence of acute nonvascular abnormality. 2. The area of abnormality seen on same-day CTV is poorly characterized on this study secondary to motion and flow related artifact. While the appearance on the CTV was atypical in appearance/morphology for acute thrombus, a follow  up CTV in approximately one week could evaluate for change if the patient's symptoms do not improve or worsen.  CT Head wo contrast 2/24/20222 IMPRESSION: No acute intracranial process. Minimal sinus disease.   Medications:  Current Outpatient Medications  Medication Instructions  . amoxicillin-clavulanate (AUGMENTIN) 875-125 MG tablet 1 tablet, Oral, 2 times daily, One po bid x 7 days  . 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 Headache likely secondary to cryptococcal meningitis and increased ICP. -Patient feels significant improvement in headache, denies pain behind his eyes.  Still photophobic and complains of throbbing.  No other neurological abnormality noted. -MRI brain on 03/26/2020 did not show any evidence of acute nonvascular abnormality.  CT venogram with small focal hypodensity along the superior aspect of sagittal sinus, possibly extradural in location and not typical in appearance for acute thrombus.  Low suspicion for acute thrombus at this time.  CT head yesterday did not show any acute intracranial process. -PT 13.5, INR 1.1 on 2/24.  Lumbar puncture on 03/27/2020 elevated pressure, +CrAg, titer of 1: 2560.  CSF fluid analysis showed WBC of 17, low glucose of 37, and protein of 140.  CSF smear + cryptococcus neoformans. Lumbar puncture on 03/29/2020  +CrAG 1:640. CSF fluid analysis shows WBC 87, normal glucose 46, and protein of 112.  -ID on board for treatment of cryptococcal meningitis with antifungals.  Currently on 5th day of 14 for induction of L-ampho and flucytosine.   Impression: -Headache, likely due to Cryptococcal meningitis -Hypoalbuminemia, moderate protein malnutrition -HIV, was previously on medication, awaiting t/t of CM.   Recommendations: -Orthostatic vital signs as c/o of lightheadedness. -Continue antifungal treatment as per ID and primary team -May need serial LPs of headaches keep recurring and worsening. -If he needs further more LPs, consideration might have to be given to a lumbar drain.   Honor Junes, MD PGY-1, Resident  03/30/2020, 9:35 AM   Attending Neurohospitalist Addendum Patient seen and examined with APP/Resident. Agree with the history and physical as  documented above. Agree with the plan as documented, which I helped formulate. I have independently reviewed the chart, obtained history, review of systems and examined the patient.I have personally reviewed pertinent head/neck/spine imaging (CT/MRI). Please feel free to call with any questions. --- Amie Portland, MD [facility] Pager: [phone]  If 7pm to 7am, please call on call as listed on AMION.

## 2020-03-30 NOTE — Progress Notes (Addendum)
Ricky [name] for Infectious Disease    Date of Admission:  03/26/2020   Total days of antibiotics 5          ID: Ricky [name] is a 65 y.o. male with  Principal Problem:   Cryptococcal meningitis ([place]) Active Problems:   Leukocytopenia   Macrocytosis   Moderate protein malnutrition (HCC)   Hypoalbuminemia   Human immunodeficiency virus (HIV) disease (HCC)   Atrial flutter (HCC)   Aortic atherosclerosis (HCC)   Hypertension   SVT (supraventricular tachycardia) (HCC)   Abnormal EKG   Acute intractable headache   Smoking   Cocaine use    Subjective: Felt better with LP yesterday, still slight headache today.   LP yesterday CSF: OP 26: WBC 97, glu 46, pro 112  Medications:  . azithromycin  1,200 mg Oral Weekly  . dextromethorphan  30 mg Oral BID  . dextrose  10 mL Intravenous Q24H  . dextrose  10 mL Intravenous Q24H  . diltiazem  30 mg Oral Q8H  . flucytosine  25 mg/kg Oral Q6H  . potassium chloride  40 mEq Oral Once  . sodium chloride  500 mL Intravenous Q24H  . sodium chloride  750 mL Intravenous Q24H  . sulfamethoxazole-trimethoprim  1 tablet Oral Daily    Objective: Vital signs in last 24 hours: Temp:  [97.7 F (36.5 C)-99.1 F (37.3 C)] 98.3 F (36.8 C) (02/25 1202) Pulse Rate:  [48-62] 62 (02/25 1202) Resp:  [16-18] 18 (02/25 1202) BP: (116-132)/(75-85) 129/75 (02/25 1202) SpO2:  [97 %-99 %] 97 % (02/25 1202)  Physical Exam  Constitutional: He is oriented to person, place, and time. He appears well-developed and well-nourished. No distress.  HENT:  Mouth/Throat: Oropharynx is clear and moist. No oropharyngeal exudate.  Cardiovascular: Normal rate, regular rhythm and normal heart sounds. Exam reveals no gallop and no friction rub.  No murmur heard.  Pulmonary/Chest: Effort normal and breath sounds normal. No respiratory distress. He has no wheezes.  Abdominal: Soft. Bowel sounds are normal. He exhibits no distension. There is no tenderness.   Lymphadenopathy:  He has no cervical adenopathy.  Neurological: He is alert and oriented to person, place, and time.  Skin: Skin is warm and dry. No rash noted. No erythema.  Psychiatric: He has a normal mood and affect. His behavior is normal.     Lab Results Recent Labs    03/29/20 0255 03/30/20 0401  WBC  --  3.9*  HGB  --  13.3  HCT  --  38.0*  NA 131* 131*  K 4.0 3.8  CL 96* 101  CO2 27 22  BUN 8 14  CREATININE 1.06 1.16   Liver Panel Recent Labs    03/29/20 0255 03/30/20 0401  PROT 7.7 7.3  ALBUMIN 3.0* 2.8*  AST 27 23  ALT 17 15  ALKPHOS 93 82  BILITOT 1.0 1.2   Sedimentation Rate No results for input(s): ESRSEDRATE in the last 72 hours. C-Reactive Protein No results for input(s): CRP in the last 72 hours.  Microbiology:  Studies/Results: CT HEAD WO CONTRAST  Result Date: 03/29/2020 CLINICAL DATA:  Meningitis/CNS infection suspected EXAM: CT HEAD WITHOUT CONTRAST TECHNIQUE: Contiguous axial images were obtained from the base of the skull through the vertex without intravenous contrast. COMPARISON:  03/26/2020 and prior. FINDINGS: Brain: No acute infarct or intracranial hemorrhage. No mass lesion. No midline shift, ventriculomegaly or extra-axial fluid collection. Vascular: No hyperdense vessel or unexpected calcification. Skull: Negative for fracture or focal  lesion. Sinuses/Orbits: Normal orbits. Minimal ethmoid and maxillary sinus mucosal thickening. No mastoid effusion. Other: None. IMPRESSION: No acute intracranial process. Minimal sinus disease. Electronically Signed   By: Primitivo Gauze M.D.   On: 03/29/2020 16:58   NM Myocar Multi W/Spect W/[place] Motion / EF  Result Date: 03/29/2020  There was no ST segment deviation noted during stress.  The study is normal.  The left ventricular ejection fraction is mildly decreased (45-54%).  Normal pharmacologic nuclear stress test with no evidence for prior infarct or ischemia. LVEF  51%.    Assessment/Plan: Cryptococcal meningitis = still having elevated OP on LP suggesting that he may need serial LP. Would repeat LP on Monday (sooner if he has worsening symptoms) with consideration for lumbar drain. Continue on L-ampho and flucytosine, currently day 5 of 14.  Medical management = will replete electrolytes  aki = will increase IVF  hiv disease= continue on oi proph of bactrim plus weekly azithromycin. 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  [name] for Infectious Diseases Cell: [phone] Pager: [phone]  03/30/2020, 12:30 PM

## 2020-03-30 NOTE — Plan of Care (Signed)

## 2020-03-30 NOTE — TOC Initial Note (Signed)
Transition of Care [facility]) - Initial/Assessment Note    Patient Details  Name: Ricky [name] MRN: [id number] Date of Birth: Feb 02, 1956  [facility] CM/SW Contact:    Pollie Friar, RN Phone Number: 03/30/2020, 10:16 AM  Clinical Narrative:                 Pt lives with sister who works so doesn't have 24 hour supervision.  Pt is without a PCP. He will need this arranged prior to d/c.  Pt denies any DME in the home.  Pt not taking any medications at home. Pt states he may have issues with transportation. CM provided him number for medicaid transport on [place].  TOC following.  Expected Discharge Plan: Home/Self Care Barriers to Discharge: Continued Medical Work up   Patient Goals and CMS Choice        Expected Discharge Plan and Services Expected Discharge Plan: Home/Self Care   Discharge Planning Services: CM Consult   Living arrangements for the past 2 months: Apartment                                      Prior Living Arrangements/Services Living arrangements for the past 2 months: Apartment Lives with:: Siblings (Sister) Patient language and need for interpreter reviewed:: Yes Do you feel safe going back to the place where you live?: Yes        Care giver support system in place?: No (comment)   Criminal Activity/Legal Involvement Pertinent to Current Situation/Hospitalization: No - Comment as needed  Activities of Daily Living      Permission Sought/Granted                  Emotional Assessment Appearance:: Appears stated age Attitude/Demeanor/Rapport: Engaged Affect (typically observed): Accepting Orientation: : Oriented to Self,Oriented to Place,Oriented to  Time   Psych Involvement: No (comment)  Admission diagnosis:  Transient vision disturbance [H53.9] Cryptococcal meningitis (HCC) [B45.1] Acute nonintractable headache, unspecified headache type [R51.9] Patient Active Problem List   Diagnosis Date Noted  . Acute  intractable headache   . Smoking   . Cocaine use   . SVT (supraventricular tachycardia) ([place])   . Abnormal EKG   . Atrial flutter ([place]) 03/27/2020  . Aortic atherosclerosis ([place]) 03/27/2020  . Hypertension 03/27/2020  . Cryptococcal meningitis ([place]) 03/26/2020  . Leukocytopenia 03/26/2020  . Macrocytosis 03/26/2020  . Moderate protein malnutrition ([place]) 03/26/2020  . Hypoalbuminemia 03/26/2020  . Human immunodeficiency virus (HIV) disease ([place]) 03/26/2020   PCP:  Pcp, No Pharmacy:   [id number] - [place], [place] [address] Phone: [phone] Fax: [phone]     Social Determinants of Health (SDOH) Interventions    Readmission Risk Interventions No flowsheet data found.

## 2020-03-30 NOTE — Progress Notes (Addendum)
PROGRESS NOTE  Ricky [name] JEH:[id number] DOB: 30-Mar-1955 DOA: 03/26/2020 PCP: Pcp, No  HPI/Recap of past 24 hours: Ricky [name] is a 65 year old male with HIV who presented to the hospital with multiple complaints including dizziness, blurred vision, sinus pressure and photophobia.  To above-mentioned symptoms and exam findings, there was a suspicion that the patient may have meningitis and thus an LP was performed in the ED. 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 These findings specifically included a positive cryptococcal antigen and a cryptococcal antigen titer of 2560.  Opening pressure was 27 and closing pressure was 21.  6 cc of CSF was removed. Patient was started on amphotericin B, methylprednisolone and flucytosine in the ED.  Also, in the ED, he was found to have SVT and started on a Cardizem infusion. He was leukopenic with a WBC count of 3.7.  03/30/20: Seen and examined at his bedside.  He reports his headache is much improved this morning.  Assessment/Plan: Principal Problem:   Cryptococcal meningitis (HCC) Active Problems:   Leukocytopenia   Macrocytosis   Moderate protein malnutrition (HCC)   Hypoalbuminemia   Human immunodeficiency virus (HIV) disease (HCC)   Atrial flutter (HCC)   Aortic atherosclerosis (HCC)   Hypertension   SVT (supraventricular tachycardia) (HCC)   Abnormal EKG   Acute intractable headache   Smoking   Cocaine use  Cryptococcal meningitis  -Continue antifungal treatments per ID -Is negative is improving this morning -If recurrent may need repeat LP or LP drain.    Resolved SVT - recurrent - spontaneously converted in ED -TSH within normal range. -2D echo has been ordered and results reveal a normal EF, undetermined diastolic parameters and dilatation of both atria - CHA2DS2-VASc Score is 1 -Continue p.o. Cardizem 30 mg 3 times daily for rate control  LUL infiltrate - he states he has a chronic cough and  sputums is sometimes clear and sometimes colored -  CT chest > cavitary airspace consolidation in the left upper and left lower lobes, findings most indicative of an atypical/fungal infectious process. - sputum culture pending -Antitussives  Mild hypovolemic hyponatremia Serum magnesium 131 Encourage increase oral intake. Repeat labs in the morning    Moderate protein malnutrition,  Hypoalbuminemia -Continue dietary supplements Body mass index is 23.5 kg/m.     Hypertension BP is currently at goal 129/75. Continue p.o. Cardizem   AIDS -Patient states that he was previously on medications but it has been over a year since he has been on them-at this time he does not have a PCP and does not follow-up with an ID physician -Continue medications as recommended by ID.           CD4 absolute   <35     Total lymphocyte count   857    CD4%   2    CD8tox   56    CD8 T Cell Abs   482    Ratio   NOT CA...                DVT prophylaxis: SCDs Start: 03/26/20 1946 Code Status: stable Family Communication:  Level of Care: Progressive. Disposition Plan:  Status is: Inpatient  Remains inpatient appropriate because:IV treatments appropriate due to intensity of illness or inability to take PO   Dispo: The patient is from: Home  Anticipated d/c is to: Home  Anticipated d/c date is: 3 days  Patient currently is not medically stable to d/c.  Difficult to place patient No  Consultants:   ID  Neurology  Procedures:   LP Antimicrobials:             Anti-infectives (From admission, onward)     Start     Dose/Rate Route Frequency Ordered Stop   03/28/20 1000  azithromycin (ZITHROMAX) tablet 1,200 mg        1,200 mg Oral Weekly 03/28/20 0912     03/28/20 1000  sulfamethoxazole-trimethoprim (BACTRIM) 400-80 MG per tablet 1 tablet        1 tablet Oral Daily 03/28/20 0912     03/27/20  2115  amphotericin B liposome (AMBISOME) 300 mg in dextrose 5 % 500 mL IVPB  Status:  Discontinued        300 mg 287.5 mL/hr over 120 Minutes Intravenous Every 24 hours 03/26/20 2024 03/26/20 2227   03/26/20 2227  amphotericin B liposome (AMBISOME) 300 mg in dextrose 5 % 500 mL IVPB        300 mg 287.5 mL/hr over 120 Minutes Intravenous Every 24 hours 03/26/20 2227     03/26/20 1945  amphotericin B liposome (AMBISOME) 300 mg in dextrose 5 % 500 mL IVPB  Status:  Discontinued        300 mg 287.5 mL/hr over 120 Minutes Intravenous Every 24 hours 03/26/20 1850 03/26/20 2024   03/26/20 1900  flucytosine (ANCOBON) capsule 2,000 mg        25 mg/kg  80 kg Oral Every 6 hours 03/26/20 1850            Status is: Inpatient    Dispo:  Patient From: Home  Planned Disposition: Home  Medically stable for discharge: No          Objective: Vitals:   03/29/20 2347 03/30/20 0358 03/30/20 0743 03/30/20 1202  BP: 128/85 116/78 127/80 129/75  Pulse: 62 (!) 48 (!) 59 62  Resp: 18 18 17 18   Temp: 98 F (36.7 C) 97.7 F (36.5 C) 98 F (36.7 C) 98.3 F (36.8 C)  TempSrc: Oral  Oral Oral  SpO2: 98% 97% 97% 97%  Weight:      Height:        Intake/Output Summary (Last 24 hours) at 03/30/2020 1520 Last data filed at 03/30/2020 1300 Gross per 24 hour  Intake 550 ml  Output 2775 ml  Net -2225 ml   Filed Weights   03/26/20 1839 03/27/20 0141  Weight: 80 kg 80.8 kg    Exam:  . General: 65 y.o. year-old male well developed well nourished in no acute distress.  Alert and oriented x3. . Cardiovascular: Regular rate and rhythm with no rubs or gallops.  No thyromegaly or JVD noted.   Marland Kitchen Respiratory: Clear to auscultation with no wheezes or rales. Good inspiratory effort. . Abdomen: Soft nontender nondistended with normal bowel sounds x4 quadrants. . Musculoskeletal: No lower extremity edema bilaterally. . Skin: No ulcerative lesions noted or rashes . Psychiatry: Mood is  appropriate for condition and setting   Data Reviewed: CBC: Recent Labs  Lab 03/26/20 0958 03/27/20 0336 03/30/20 0401  WBC 3.7* 5.4 3.9*  NEUTROABS 2.2  --   --   HGB 13.9 14.3 13.3  HCT 42.3 42.4 38.0*  MCV 101.2* 99.8 96.0  PLT 325 324 158   Basic Metabolic Panel: Recent Labs  Lab 03/26/20 0958 03/26/20 1829 03/27/20 0336 03/28/20 0428 03/29/20 0255 03/30/20 0401  NA 135  --  131* 133* 131* 131*  K 4.2  --  4.0 4.2 4.0 3.8  CL 101  --  98 99 96* 101  CO2 25  --  20* 25 27 22   GLUCOSE 117*  --  234* 115* 105* 107*  BUN 12  --  10 13 8 14   CREATININE 1.16  --  1.09 0.96 1.06 1.16  CALCIUM 8.6*  --  8.6* 8.6* 9.0 8.7*  MG  --  1.8 2.1 1.9 1.9 2.2  PHOS  --  2.9  --   --   --   --    GFR: Estimated Creatinine Clearance: 74.6 mL/min (by C-G formula based on SCr of 1.16 mg/dL). Liver Function Tests: Recent Labs  Lab 03/26/20 0958 03/27/20 0336 03/28/20 0428 03/29/20 0255 03/30/20 0401  AST 27 30 25 27 23   ALT 16 18 16 17 15   ALKPHOS 104 95 92 93 82  BILITOT 1.1 1.0 0.9 1.0 1.2  PROT 7.3 7.6 7.1 7.7 7.3  ALBUMIN 2.9* 3.0* 2.9* 3.0* 2.8*   No results for input(s): LIPASE, AMYLASE in the last 168 hours. No results for input(s): AMMONIA in the last 168 hours. Coagulation Profile: Recent Labs  Lab 03/29/20 1312  INR 1.1   Cardiac Enzymes: No results for input(s): CKTOTAL, CKMB, CKMBINDEX, TROPONINI in the last 168 hours. BNP (last 3 results) No results for input(s): PROBNP in the last 8760 hours. HbA1C: No results for input(s): HGBA1C in the last 72 hours. CBG: No results for input(s): GLUCAP in the last 168 hours. Lipid Profile: Recent Labs    03/29/20 0255  CHOL 141  HDL 31*  LDLCALC 86  TRIG 118  CHOLHDL 4.5   Thyroid Function Tests: No results for input(s): TSH, T4TOTAL, FREET4, T3FREE, THYROIDAB in the last 72 hours. Anemia Panel: No results for input(s): VITAMINB12, FOLATE, FERRITIN, TIBC, IRON, RETICCTPCT in the last 72 hours. Urine  analysis: No results found for: COLORURINE, APPEARANCEUR, LABSPEC, PHURINE, GLUCOSEU, HGBUR, BILIRUBINUR, KETONESUR, PROTEINUR, UROBILINOGEN, NITRITE, LEUKOCYTESUR Sepsis Labs: @LABRCNTIP (procalcitonin:4,lacticidven:4)  ) Recent Results (from the past 240 hour(s))  CSF culture     Status: Abnormal   Collection Time: 03/26/20  5:05 PM   Specimen: CSF; Cerebrospinal Fluid  Result Value Ref Range Status   Specimen Description CSF  Final   Special Requests NONE  Final   Gram Stain   Final    WBC PRESENT,BOTH PMN AND MONONUCLEAR CRYPTOCOCCUS NEOFORMANS CYTOSPIN SMEAR Performed at [facility], [address] [place], [place] [address]    Culture CRYPTOCOCCUS NEOFORMANS (A)  Final   Report Status 03/29/2020 FINAL  Final  Fungus Culture With Stain     Status: None (Preliminary result)   Collection Time: 03/26/20  5:05 PM   Specimen: Lumbar Puncture; Cerebrospinal Fluid  Result Value Ref Range Status   Fungus Stain Final report  Final    Comment: (NOTE) Performed At: [facility] [address] [place], [place] [id number] [name] MD WU:[id number]    Fungus (Mycology) Culture PENDING  Incomplete   Fungal Source CSF  Final    Comment: Performed at [facility], [place] [address], [place], [place] [address]  Fungus Culture Result     Status: None   Collection Time: 03/26/20  5:05 PM  Result Value Ref Range Status   Result 1 Comment  Final    Comment: (NOTE) KOH/Calcofluor preparation:  no fungus observed. Performed At: [facility] [place], [place] [id number] [name] MD ZD:[id number]   Expectorated Sputum Assessment w Gram Stain, Rflx to  Resp Cult     Status: None   Collection Time: 03/27/20  8:23 PM   Specimen: Sputum  Result Value Ref Range Status   Specimen Description SPUTUM  Final   Special Requests NONE  Final   Sputum evaluation   Final    Sputum specimen not acceptable for testing.  Please recollect.   RESULT  CALLED TO, READ BACK BY AND VERIFIED WITH: Radene Knee RN 03/27/20 2225 JDW Performed at [facility], [place] [address], [place], [place] [address]    Report Status 03/27/2020 FINAL  Final  Expectorated Sputum Assessment w Gram Stain, Rflx to Resp Cult     Status: None   Collection Time: 03/28/20  1:45 AM   Specimen: Sputum  Result Value Ref Range Status   Specimen Description SPUTUM  Final   Special Requests NONE  Final   Sputum evaluation   Final    Sputum specimen not acceptable for testing.  Please recollect.   RESULT CALLED TO, READ BACK BY AND VERIFIED WITH: P ASIDI RN 03/27/20 0354 JDW Performed at [facility], [place] [address], [place], [place] [address]    Report Status 03/28/2020 FINAL  Final  Acid Fast Smear (AFB)     Status: None   Collection Time: 03/28/20 12:00 PM   Specimen: Sputum  Result Value Ref Range Status   AFB Specimen Processing Concentration  Final   Acid Fast Smear Negative  Final    Comment: (NOTE) Performed At: [facility] [address] [place], [place] [id number] [name] MD [id number]    Source (AFB) SPU  Final    Comment: Performed at [facility], [place] [address], [place], [place] [address]  CSF culture w Gram Stain     Status: None (Preliminary result)   Collection Time: 03/29/20  4:24 PM   Specimen: CSF; Cerebrospinal Fluid  Result Value Ref Range Status   Specimen Description CSF  Final   Special Requests SAMPLE IN FRIDGE PRIOR TO ADDING ON CSF CULTURE  Final   Gram Stain   Final    WBC PRESENT,BOTH PMN AND MONONUCLEAR CYTOSPIN SMEAR CORRECTED RESULTS NO ORGANISMS SEEN PREVIOUSLY REPORTED AS: CRYPTOCOCCUS NEOFORMANS CORRECTED RESULTS CALLED TO: RN SONYA B 0820 [id number] FCP    Culture   Final    NO GROWTH < 12 HOURS Performed at [facility], [place] [address], [place], [place] [address]    Report Status PENDING  Incomplete      Studies: CT HEAD WO CONTRAST  Result Date: 03/29/2020 CLINICAL DATA:   Meningitis/CNS infection suspected EXAM: CT HEAD WITHOUT CONTRAST TECHNIQUE: Contiguous axial images were obtained from the base of the skull through the vertex without intravenous contrast. COMPARISON:  03/26/2020 and prior. FINDINGS: Brain: No acute infarct or intracranial hemorrhage. No mass lesion. No midline shift, ventriculomegaly or extra-axial fluid collection. Vascular: No hyperdense vessel or unexpected calcification. Skull: Negative for fracture or focal lesion. Sinuses/Orbits: Normal orbits. Minimal ethmoid and maxillary sinus mucosal thickening. No mastoid effusion. Other: None. IMPRESSION: No acute intracranial process. Minimal sinus disease. Electronically Signed   By: Primitivo Gauze M.D.   On: 03/29/2020 16:58    Scheduled Meds: . azithromycin  1,200 mg Oral Weekly  . dextromethorphan  30 mg Oral BID  . dextrose  10 mL Intravenous Q24H  . dextrose  10 mL Intravenous Q24H  . diltiazem  30 mg Oral Q8H  . flucytosine  25 mg/kg Oral Q6H  . sodium chloride  500 mL Intravenous Q24H  . sodium chloride  750 mL Intravenous Q24H  . sulfamethoxazole-trimethoprim  1 tablet Oral Daily    Continuous Infusions: . amphotericin  B  Liposome (AMBISOME) ADULT IV 300 mg (03/29/20 1728)     LOS: 4 days     Kayleen Memos, MD [facility] Pager [phone]  If 7PM-7AM, please contact night-coverage [email or web address] Password [email or web address] 03/30/2020, 3:20 PM

## 2020-03-31 DIAGNOSIS — R519 Headache, unspecified: Secondary | ICD-10-CM | POA: Diagnosis not present

## 2020-03-31 DIAGNOSIS — E440 Moderate protein-calorie malnutrition: Secondary | ICD-10-CM | POA: Diagnosis not present

## 2020-03-31 DIAGNOSIS — I471 Supraventricular tachycardia: Secondary | ICD-10-CM

## 2020-03-31 DIAGNOSIS — I10 Essential (primary) hypertension: Secondary | ICD-10-CM | POA: Diagnosis not present

## 2020-03-31 LAB — EXPECTORATED SPUTUM ASSESSMENT W GRAM STAIN, RFLX TO RESP C

## 2020-03-31 LAB — COMPREHENSIVE METABOLIC PANEL
ALT: 17 U/L (ref 0–44)
AST: 24 U/L (ref 15–41)
Albumin: 2.8 g/dL — ABNORMAL LOW (ref 3.5–5.0)
Alkaline Phosphatase: 100 U/L (ref 38–126)
Anion gap: 7 (ref 5–15)
BUN: 13 mg/dL (ref 8–23)
CO2: 21 mmol/L — ABNORMAL LOW (ref 22–32)
Calcium: 8.8 mg/dL — ABNORMAL LOW (ref 8.9–10.3)
Chloride: 102 mmol/L (ref 98–111)
Creatinine, Ser: 1.05 mg/dL (ref 0.61–1.24)
GFR, Estimated: >60 mL/min (ref >60)
Glucose, Bld: 102 mg/dL — ABNORMAL HIGH (ref 70–99)
Potassium: 4 mmol/L (ref 3.5–5.1)
Sodium: 130 mmol/L — ABNORMAL LOW (ref 135–145)
Total Bilirubin: 0.8 mg/dL (ref 0.3–1.2)
Total Protein: 7.2 g/dL (ref 6.5–8.1)

## 2020-03-31 LAB — CBC
HCT: 35.3 % — ABNORMAL LOW (ref 39.0–52.0)
Hemoglobin: 12.6 g/dL — ABNORMAL LOW (ref 13.0–17.0)
MCH: 33.9 pg (ref 26.0–34.0)
MCHC: 35.7 g/dL (ref 30.0–36.0)
MCV: 94.9 fL (ref 80.0–100.0)
Platelets: 329 10*3/uL (ref 150–400)
RBC: 3.72 MIL/uL — ABNORMAL LOW (ref 4.22–5.81)
RDW: 11.4 % — ABNORMAL LOW (ref 11.5–15.5)
WBC: 4.6 10*3/uL (ref 4.0–10.5)
nRBC: 0 % (ref 0.0–0.2)

## 2020-03-31 LAB — ACID FAST SMEAR (AFB, MYCOBACTERIA): Acid Fast Smear: NEGATIVE

## 2020-03-31 LAB — OSMOLALITY: Osmolality: 283 mOsm/kg (ref 275–295)

## 2020-03-31 LAB — MAGNESIUM: Magnesium: 1.8 mg/dL (ref 1.7–2.4)

## 2020-03-31 LAB — OSMOLALITY, URINE: Osmolality, Ur: 367 mOsm/kg (ref 300–900)

## 2020-03-31 MED ORDER — MAGNESIUM SULFATE 2 GM/50ML IV SOLN
2.0000 g | Freq: Once | INTRAVENOUS | Status: AC
  Administered 2020-03-31: 2 g via INTRAVENOUS
  Filled 2020-03-31: qty 50

## 2020-03-31 NOTE — Progress Notes (Signed)
[facility]  PROGRESS NOTE  Ricky [name] WIO:[id number] DOB: July 06, 1955 DOA: 03/26/2020 PCP: Pcp, No   Brief HPI:   65 year old male with history of HIV who presented to hospital with multiple complaints including dizziness, blurred vision, sinus pressure and photophobia.  There was suspicion that patient may have meningitis, LP was performed in the ED.  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  LP findings were consistent with positive cryptococcal antigen and cryptococcal antigen titer of 2560.  Opening pressure was 27, closing pressure 21.  Patient started on amphotericin B, methylprednisolone and flucytosine in the ED. Also in the ED he was found to have SVT and was started on Cardizem infusion.  He was mildly leukopenic with WBC count of 3.7.    Subjective   Patient seen and examined, denies headache at this time.   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  Headache has improved.  If headache recurs may need repeat LP or LP drain as per ID. 2. SVT-resolved, spontaneously converted to normal sinus rhythm.  TSH within normal range.  2D echo showed normal EF, undetermined diastolic parameters and dilatation of both atria.  Continue Cardizem 30 mg 3 times daily for rate control. 3. Left upper lobe infiltrate-CT chest showed cavitary airspace consolidation in the left upper and left lower lobes.  Most indicative of atypical/fungal infectious process.  Sputum culture is pending.  Patient is already on antifungal treatment as above.  ID following. 4. Hypovolemic hyponatremia-serum sodium level 130.  Will obtain serum and urine osmolality. 5. Hypertension-blood pressure stable, continue Cardizem 6. Moderate protein calorie malnutrition-continue diet dietary supplements.  BMI 23.5 kg/m scale. 7. AIDS-CD4 count less than 35, patient says he was on medications previously but over the past 1 year he has not taken  medications.  He does not have a PCP and does not follow ID physician.  ID following.       COVID-19 Labs  No results for input(s): DDIMER, FERRITIN, LDH, CRP in the last 72 hours.  Lab Results  Component Value Date   SARSCOV2NAA POSITIVE (A) 03/03/2020     Scheduled medications:   . azithromycin  1,200 mg Oral Weekly  . dextromethorphan  30 mg Oral BID  . dextrose  10 mL Intravenous Q24H  . dextrose  10 mL Intravenous Q24H  . diltiazem  30 mg Oral Q8H  . flucytosine  25 mg/kg Oral Q6H  . sodium chloride  500 mL Intravenous Q24H  . sodium chloride  750 mL Intravenous Q24H  . sulfamethoxazole-trimethoprim  1 tablet Oral Daily         CBG: No results for input(s): GLUCAP in the last 168 hours.  SpO2: 99 %    CBC: Recent Labs  Lab 03/26/20 0958 03/27/20 0336 03/30/20 0401 03/31/20 0259  WBC 3.7* 5.4 3.9* 4.6  NEUTROABS 2.2  --   --   --   HGB 13.9 14.3 13.3 12.6*  HCT 42.3 42.4 38.0* 35.3*  MCV 101.2* 99.8 96.0 94.9  PLT 325 324 321 384    Basic Metabolic Panel: Recent Labs  Lab 03/26/20 1829 03/27/20 0336 03/28/20 0428 03/29/20 0255 03/30/20 0401 03/31/20 0259  NA  --  131* 133* 131* 131* 130*  K  --  4.0 4.2 4.0 3.8 4.0  CL  --  98 99 96* 101 102  CO2  --  20* 25 27 22  21*  GLUCOSE  --  234* 115* 105* 107* 102*  BUN  --  10 13 8 14 13   CREATININE  --  1.09 0.96 1.06 1.16 1.05  CALCIUM  --  8.6* 8.6* 9.0 8.7* 8.8*  MG 1.8 2.1 1.9 1.9 2.2 1.8  PHOS 2.9  --   --   --   --   --      Liver Function Tests: Recent Labs  Lab 03/27/20 0336 03/28/20 0428 03/29/20 0255 03/30/20 0401 03/31/20 0259  AST 30 25 27 23 24   ALT 18 16 17 15 17   ALKPHOS 95 92 93 82 100  BILITOT 1.0 0.9 1.0 1.2 0.8  PROT 7.6 7.1 7.7 7.3 7.2  ALBUMIN 3.0* 2.9* 3.0* 2.8* 2.8*     Antibiotics: Anti-infectives (From admission, onward)   Start     Dose/Rate Route Frequency Ordered Stop   03/28/20 1000  azithromycin (ZITHROMAX) tablet 1,200 mg        1,200 mg Oral  Weekly 03/28/20 0912     03/28/20 1000  sulfamethoxazole-trimethoprim (BACTRIM) 400-80 MG per tablet 1 tablet        1 tablet Oral Daily 03/28/20 0912     03/27/20 2115  amphotericin B liposome (AMBISOME) 300 mg in dextrose 5 % 500 mL IVPB  Status:  Discontinued        300 mg 287.5 mL/hr over 120 Minutes Intravenous Every 24 hours 03/26/20 2024 03/26/20 2227   03/26/20 2227  amphotericin B liposome (AMBISOME) 300 mg in dextrose 5 % 500 mL IVPB        300 mg 287.5 mL/hr over 120 Minutes Intravenous Every 24 hours 03/26/20 2227     03/26/20 1945  amphotericin B liposome (AMBISOME) 300 mg in dextrose 5 % 500 mL IVPB  Status:  Discontinued        300 mg 287.5 mL/hr over 120 Minutes Intravenous Every 24 hours 03/26/20 1850 03/26/20 2024   03/26/20 1900  flucytosine (ANCOBON) capsule 2,000 mg        25 mg/kg  80 kg Oral Every 6 hours 03/26/20 1850         DVT prophylaxis: SCDs  Code Status: Full code  Family Communication: No family at bedside   Consultants:  ID  Nephrology  Procedures:  Lumbar puncture    Objective   Vitals:   03/30/20 2349 03/31/20 0337 03/31/20 0754 03/31/20 1138  BP: (!) 145/91 128/82 126/83 127/81  Pulse: (!) 59 (!) 59 69 61  Resp: 18 17 19 20   Temp: 98.6 F (37 C) 98.6 F (37 C) 98 F (36.7 C) 97.9 F (36.6 C)  TempSrc: Oral Oral Oral Oral  SpO2: 99% 96% 98% 99%  Weight:      Height:        Intake/Output Summary (Last 24 hours) at 03/31/2020 1451 Last data filed at 03/31/2020 1413 Gross per 24 hour  Intake 6078.67 ml  Output 3195 ml  Net 2883.67 ml    02/24 1901 - 02/26 0700 In: 5680.7 [P.O.:1390] Out: 2094 [Urine:4395]  Filed Weights   03/26/20 1839 03/27/20 0141  Weight: 80 kg 80.8 kg    Physical Examination:    General-appears in no acute distress  Heart-S1-S2, regular, no murmur auscultated  Lungs-clear to auscultation bilaterally, no wheezing or crackles auscultated  Abdomen-soft, nontender, no  organomegaly  Extremities-no edema in the lower extremities  Neuro-alert, oriented x3, no focal deficit noted   Status is: Inpatient  Dispo: The patient is from: Home  Anticipated d/c is to: Home              Anticipated d/c date is: 3 days              Patient currently not medically stable for discharge  Barrier to discharge-ongoing treatment for cryptococcal meningitis       Data Reviewed:   Recent Results (from the past 240 hour(s))  CSF culture     Status: Abnormal   Collection Time: 03/26/20  5:05 PM   Specimen: CSF; Cerebrospinal Fluid  Result Value Ref Range Status   Specimen Description CSF  Final   Special Requests NONE  Final   Gram Stain   Final    WBC PRESENT,BOTH PMN AND MONONUCLEAR CRYPTOCOCCUS NEOFORMANS CYTOSPIN SMEAR Performed at [facility], [address], [place], [place] [address]    Culture CRYPTOCOCCUS NEOFORMANS (A)  Final   Report Status 03/29/2020 FINAL  Final  Fungus Culture With Stain     Status: None (Preliminary result)   Collection Time: 03/26/20  5:05 PM   Specimen: Lumbar Puncture; Cerebrospinal Fluid  Result Value Ref Range Status   Fungus Stain Final report  Final    Comment: (NOTE) Performed At: [facility] [address] [place], [place] [id number] [name] MD [id number]    Fungus (Mycology) Culture PENDING  Incomplete   Fungal Source CSF  Final    Comment: Performed at [facility], [place] [address] [place], [place] [address]  Fungus Culture Result     Status: None   Collection Time: 03/26/20  5:05 PM  Result Value Ref Range Status   Result 1 Comment  Final    Comment: (NOTE) KOH/Calcofluor preparation:  no fungus observed. Performed At: [facility] [place], [place] [id number] [name] MD [id number]   Expectorated Sputum Assessment w Gram Stain, Rflx to Resp Cult     Status: None   Collection Time: 03/27/20  8:23 PM   Specimen: Sputum  Result  Value Ref Range Status   Specimen Description SPUTUM  Final   Special Requests NONE  Final   Sputum evaluation   Final    Sputum specimen not acceptable for testing.  Please recollect.   RESULT CALLED TO, READ BACK BY AND VERIFIED WITH: Radene Knee RN 03/27/20 2225 JDW Performed at [facility], [place] [address] [place], [place] [address]    Report Status 03/27/2020 FINAL  Final  Expectorated Sputum Assessment w Gram Stain, Rflx to Resp Cult     Status: None   Collection Time: 03/28/20  1:45 AM   Specimen: Sputum  Result Value Ref Range Status   Specimen Description SPUTUM  Final   Special Requests NONE  Final   Sputum evaluation   Final    Sputum specimen not acceptable for testing.  Please recollect.   RESULT CALLED TO, READ BACK BY AND VERIFIED WITH: P ASIDI RN 03/27/20 0354 JDW Performed at [facility], [place] [address], [place], [place] [address]    Report Status 03/28/2020 FINAL  Final  Acid Fast Smear (AFB)     Status: None   Collection Time: 03/28/20 12:00 PM   Specimen: Sputum  Result Value Ref Range Status   AFB Specimen Processing Concentration  Final   Acid Fast Smear Negative  Final    Comment: (NOTE) Performed At: [facility] [address] [place], [place] [id number] [name] MD [id number]    Source (AFB) SPU  Final    Comment: Performed at [facility], [place] [address] [place], [place] [address]  Acid Fast Smear (AFB)     Status: None   Collection Time: 03/29/20  1:56 PM   Specimen: Sputum  Result Value Ref Range Status   AFB Specimen Processing Concentration  Final   Acid Fast Smear Negative  Final    Comment: (NOTE) Performed At: [facility] [place], [place] [id number] [name] MD [id number]    Source (AFB) SPUTUM  Final    Comment: Performed at [facility], [place] [address], [place], [place] [address]  CSF culture w Gram Stain     Status: None (Preliminary result)   Collection Time:  03/29/20  4:24 PM   Specimen: CSF; Cerebrospinal Fluid  Result Value Ref Range Status   Specimen Description CSF  Final   Special Requests SAMPLE IN FRIDGE PRIOR TO ADDING ON CSF CULTURE  Final   Gram Stain   Final    WBC PRESENT,BOTH PMN AND MONONUCLEAR CYTOSPIN SMEAR CORRECTED RESULTS NO ORGANISMS SEEN PREVIOUSLY REPORTED AS: CRYPTOCOCCUS NEOFORMANS CORRECTED RESULTS CALLED TO: RN Davy Pique B 0820 [id number] FCP    Culture   Final    NO GROWTH 2 DAYS Performed at [facility], [place] [address] [place], [place] [address]    Report Status PENDING  Incomplete  Expectorated Sputum Assessment w Gram Stain, Rflx to Resp Cult     Status: None   Collection Time: 03/30/20 10:50 AM   Specimen: Expectorated Sputum  Result Value Ref Range Status   Specimen Description EXPECTORATED SPUTUM  Final   Special Requests NONE  Final   Sputum evaluation   Final    Sputum specimen not acceptable for testing.  Please recollect.   RESULT CALLED TO, READ BACK BY AND VERIFIED WITH: ORODOSA RN 03/31/20 0539 JDW Performed at [facility] [address], [place], [place] [address]    Report Status 03/31/2020 FINAL  Final    No results for input(s): LIPASE, AMYLASE in the last 168 hours. No results for input(s): AMMONIA in the last 168 hours.  Cardiac Enzymes: No results for input(s): CKTOTAL, CKMB, CKMBINDEX, TROPONINI in the last 168 hours. BNP (last 3 results) No results for input(s): BNP in the last 8760 hours.  ProBNP (last 3 results) No results for input(s): PROBNP in the last 8760 hours.  Studies:  CT HEAD WO CONTRAST  Result Date: 03/29/2020 CLINICAL DATA:  Meningitis/CNS infection suspected EXAM: CT HEAD WITHOUT CONTRAST TECHNIQUE: Contiguous axial images were obtained from the base of the skull through the vertex without intravenous contrast. COMPARISON:  03/26/2020 and prior. FINDINGS: Brain: No acute infarct or intracranial hemorrhage. No mass lesion. No midline shift,  ventriculomegaly or extra-axial fluid collection. Vascular: No hyperdense vessel or unexpected calcification. Skull: Negative for fracture or focal lesion. Sinuses/Orbits: Normal orbits. Minimal ethmoid and maxillary sinus mucosal thickening. No mastoid effusion. Other: None. IMPRESSION: No acute intracranial process. Minimal sinus disease. Electronically Signed   By: Primitivo Gauze M.D.   On: 03/29/2020 16:58       [place]   [facility] If 7PM-7AM, please contact night-coverage at [email or web address], Office  [phone]   03/31/2020, 2:51 PM  LOS: 5 days

## 2020-03-31 NOTE — Progress Notes (Addendum)
Patient briefly seen today. No focal deficits on exam. Reports of minor discomfort behind the right eye.  No gross headache. We will check back on him tomorrow and if headaches start to happen, will have to consider another spinal tap.   -- Amie Portland, MD Neurologist [facility] Pager: [phone]

## 2020-04-01 DIAGNOSIS — Z8611 Personal history of tuberculosis: Secondary | ICD-10-CM

## 2020-04-01 DIAGNOSIS — I10 Essential (primary) hypertension: Secondary | ICD-10-CM | POA: Diagnosis not present

## 2020-04-01 DIAGNOSIS — E440 Moderate protein-calorie malnutrition: Secondary | ICD-10-CM | POA: Diagnosis not present

## 2020-04-01 DIAGNOSIS — B20 Human immunodeficiency virus [HIV] disease: Secondary | ICD-10-CM | POA: Diagnosis not present

## 2020-04-01 DIAGNOSIS — N179 Acute kidney failure, unspecified: Secondary | ICD-10-CM | POA: Diagnosis not present

## 2020-04-01 DIAGNOSIS — B451 Cerebral cryptococcosis: Secondary | ICD-10-CM | POA: Diagnosis not present

## 2020-04-01 DIAGNOSIS — R519 Headache, unspecified: Secondary | ICD-10-CM | POA: Diagnosis not present

## 2020-04-01 DIAGNOSIS — J479 Bronchiectasis, uncomplicated: Secondary | ICD-10-CM

## 2020-04-01 DIAGNOSIS — I471 Supraventricular tachycardia: Secondary | ICD-10-CM | POA: Diagnosis not present

## 2020-04-01 LAB — BASIC METABOLIC PANEL
Anion gap: 8 (ref 5–15)
BUN: 14 mg/dL (ref 8–23)
CO2: 22 mmol/L (ref 22–32)
Calcium: 8.8 mg/dL — ABNORMAL LOW (ref 8.9–10.3)
Chloride: 103 mmol/L (ref 98–111)
Creatinine, Ser: 1.08 mg/dL (ref 0.61–1.24)
GFR, Estimated: >60 mL/min (ref >60)
Glucose, Bld: 112 mg/dL — ABNORMAL HIGH (ref 70–99)
Potassium: 4.2 mmol/L (ref 3.5–5.1)
Sodium: 133 mmol/L — ABNORMAL LOW (ref 135–145)

## 2020-04-01 LAB — MAGNESIUM: Magnesium: 2.2 mg/dL (ref 1.7–2.4)

## 2020-04-01 NOTE — Progress Notes (Signed)
[facility]  PROGRESS NOTE  Ricky [name] HWE:[id number] DOB: Jun 24, 1955 DOA: 03/26/2020 PCP: Pcp, No   Brief HPI:   65 year old male with history of HIV who presented to hospital with multiple complaints including dizziness, blurred vision, sinus pressure and photophobia.  There was suspicion that patient may have meningitis, LP was performed in the ED.  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  LP findings were consistent with positive cryptococcal antigen and cryptococcal antigen titer of 2560.  Opening pressure was 27, closing pressure 21.  Patient started on amphotericin B, methylprednisolone and flucytosine in the ED. Also in the ED he was found to have SVT and was started on Cardizem infusion.  He was mildly leukopenic with WBC count of 3.7.    Subjective   Patient seen and examined, denies headache this morning.   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  Headache has improved.  If headache recurs may need repeat LP or LP drain as per ID. 2. SVT-resolved, spontaneously converted to normal sinus rhythm.  TSH within normal range.  2D echo showed normal EF, undetermined diastolic parameters and dilatation of both atria.  Continue Cardizem 30 mg 3 times daily for rate control. 3. Left upper lobe infiltrate-CT chest showed cavitary airspace consolidation in the left upper and left lower lobes.  Most indicative of atypical/fungal infectious process.  Sputum culture is pending.  Patient is already on antifungal treatment as above.  ID following. 4. Hypovolemic hyponatremia-serum sodium level has improved to 133.  Serum osmolality 283, urine osmolality 367.  5. Hypertension-blood pressure stable, continue Cardizem 6. Moderate protein calorie malnutrition-continue diet dietary supplements.  BMI 23.5 kg/m scale. 7. AIDS-CD4 count less than 35, patient says he was on medications previously but over the past 1 year he  has not taken medications.  He does not have a PCP and does not follow ID physician.  ID following.  Continue Bactrim for PCP prophylaxis, azithromycin for MAC prophylaxis.     COVID-19 Labs  No results for input(s): DDIMER, FERRITIN, LDH, CRP in the last 72 hours.  Lab Results  Component Value Date   SARSCOV2NAA POSITIVE (A) 03/03/2020     Scheduled medications:   . azithromycin  1,200 mg Oral Weekly  . dextromethorphan  30 mg Oral BID  . dextrose  10 mL Intravenous Q24H  . dextrose  10 mL Intravenous Q24H  . diltiazem  30 mg Oral Q8H  . flucytosine  25 mg/kg Oral Q6H  . sodium chloride  500 mL Intravenous Q24H  . sodium chloride  750 mL Intravenous Q24H  . sulfamethoxazole-trimethoprim  1 tablet Oral Daily         CBG: No results for input(s): GLUCAP in the last 168 hours.  SpO2: 97 %    CBC: Recent Labs  Lab 03/26/20 0958 03/27/20 0336 03/30/20 0401 03/31/20 0259  WBC 3.7* 5.4 3.9* 4.6  NEUTROABS 2.2  --   --   --   HGB 13.9 14.3 13.3 12.6*  HCT 42.3 42.4 38.0* 35.3*  MCV 101.2* 99.8 96.0 94.9  PLT 325 324 321 893    Basic Metabolic Panel: Recent Labs  Lab 03/26/20 1829 03/27/20 0336 03/28/20 0428 03/29/20 0255 03/30/20 0401 03/31/20 0259 04/01/20 0227  NA  --    < > 133* 131* 131* 130* 133*  K  --    < > 4.2 4.0 3.8 4.0 4.2  CL  --    < > 99  96* 101 102 103  CO2  --    < > _0 21* 22  GLUCOSE  --    < > 115* 105* 107* 102* 112*  BUN  --    < > _1 CREATININE  --    < > 0.96 1.06 1.16 1.05 1.08  CALCIUM  --    < > 8.6* 9.0 8.7* 8.8* 8.8*  MG 1.8   < > 1.9 1.9 2.2 1.8 2.2  PHOS 2.9  --   --   --   --   --   --    < > = values in this interval not displayed.     Liver Function Tests: Recent Labs  Lab 03/27/20 0336 03/28/20 0428 03/29/20 0255 03/30/20 0401 03/31/20 0259  AST _2 ALT _3 ALKPHOS 95 92 93 82 100  BILITOT 1.0 0.9 1.0 1.2 0.8  PROT 7.6 7.1 7.7 7.3 7.2  ALBUMIN 3.0* 2.9* 3.0*  2.8* 2.8*     Antibiotics: Anti-infectives (From admission, onward)   Start     Dose/Rate Route Frequency Ordered Stop   03/28/20 1000  azithromycin (ZITHROMAX) tablet 1,200 mg        1,200 mg Oral Weekly 03/28/20 0912     03/28/20 1000  sulfamethoxazole-trimethoprim (BACTRIM) 400-80 MG per tablet 1 tablet        1 tablet Oral Daily 03/28/20 0912     03/27/20 2115  amphotericin B liposome (AMBISOME) 300 mg in dextrose 5 % 500 mL IVPB  Status:  Discontinued        300 mg 287.5 mL/hr over 120 Minutes Intravenous Every 24 hours 03/26/20 2024 03/26/20 2227   03/26/20 2227  amphotericin B liposome (AMBISOME) 300 mg in dextrose 5 % 500 mL IVPB        300 mg 287.5 mL/hr over 120 Minutes Intravenous Every 24 hours 03/26/20 2227     03/26/20 1945  amphotericin B liposome (AMBISOME) 300 mg in dextrose 5 % 500 mL IVPB  Status:  Discontinued        300 mg 287.5 mL/hr over 120 Minutes Intravenous Every 24 hours 03/26/20 1850 03/26/20 2024   03/26/20 1900  flucytosine (ANCOBON) capsule 2,000 mg        25 mg/kg  80 kg Oral Every 6 hours 03/26/20 1850         DVT prophylaxis: SCDs  Code Status: Full code  Family Communication: No family at bedside   Consultants:  ID  Nephrology  Procedures:  Lumbar puncture    Objective   Vitals:   03/31/20 2136 04/01/20 0000 04/01/20 0546 04/01/20 0807  BP: (!) 129/99 124/88 130/81 124/83  Pulse: (!) 57 62 60 61  Resp:  _4 Temp:  97.9 F (36.6 C) 97.8 F (36.6 C) 98.5 F (36.9 C)  TempSrc:  Oral Oral Oral  SpO2:  99% 98% 97%  Weight:      Height:        Intake/Output Summary (Last 24 hours) at 04/01/2020 1110 Last data filed at 04/01/2020 0753 Gross per 24 hour  Intake 290 ml  Output 4555 ml  Net -4265 ml    02/25 1901 - 02/27 0700 In: 3933.7 [P.O.:1308] Out: 8315 [Urine:5475]  Filed Weights   03/26/20 1839 03/27/20 0141  Weight: 80 kg 80.8 kg    Physical Examination:   General-appears in no acute  distress  Heart-S1-S2,  regular, no murmur auscultated  Lungs-clear to auscultation bilaterally, no wheezing or crackles auscultated  Abdomen-soft, nontender, no organomegaly  Extremities-no edema in the lower extremities  Neuro-alert, oriented x3, no focal deficit noted   Status is: Inpatient  Dispo: The patient is from: Home              Anticipated d/c is to: Home              Anticipated d/c date is: 3 days              Patient currently not medically stable for discharge  Barrier to discharge-ongoing treatment for cryptococcal meningitis       Data Reviewed:   Recent Results (from the past 240 hour(s))  CSF culture     Status: Abnormal   Collection Time: 03/26/20  5:05 PM   Specimen: CSF; Cerebrospinal Fluid  Result Value Ref Range Status   Specimen Description CSF  Final   Special Requests NONE  Final   Gram Stain   Final    WBC PRESENT,BOTH PMN AND MONONUCLEAR CRYPTOCOCCUS NEOFORMANS CYTOSPIN SMEAR Performed at [facility], [address] [place], [place] [address]    Culture CRYPTOCOCCUS NEOFORMANS (A)  Final   Report Status 03/29/2020 FINAL  Final  Fungus Culture With Stain     Status: None (Preliminary result)   Collection Time: 03/26/20  5:05 PM   Specimen: Lumbar Puncture; Cerebrospinal Fluid  Result Value Ref Range Status   Fungus Stain Final report  Final    Comment: (NOTE) Performed At: [facility] [address] [place], [place] [id number] [name] MD [id number]    Fungus (Mycology) Culture PENDING  Incomplete   Fungal Source CSF  Final    Comment: Performed at [facility], [place] [address] [place], [place] [address]  Fungus Culture Result     Status: None   Collection Time: 03/26/20  5:05 PM  Result Value Ref Range Status   Result 1 Comment  Final    Comment: (NOTE) KOH/Calcofluor preparation:  no fungus observed. Performed At: [facility] [place], [place] [id number] [name]  MD [id number]   Expectorated Sputum Assessment w Gram Stain, Rflx to Resp Cult     Status: None   Collection Time: 03/27/20  8:23 PM   Specimen: Sputum  Result Value Ref Range Status   Specimen Description SPUTUM  Final   Special Requests NONE  Final   Sputum evaluation   Final    Sputum specimen not acceptable for testing.  Please recollect.   RESULT CALLED TO, READ BACK BY AND VERIFIED WITH: Radene Knee RN 03/27/20 2225 JDW Performed at [facility], [place] [address], [place], [place] [address]    Report Status 03/27/2020 FINAL  Final  Expectorated Sputum Assessment w Gram Stain, Rflx to Resp Cult     Status: None   Collection Time: 03/28/20  1:45 AM   Specimen: Sputum  Result Value Ref Range Status   Specimen Description SPUTUM  Final   Special Requests NONE  Final   Sputum evaluation   Final    Sputum specimen not acceptable for testing.  Please recollect.   RESULT CALLED TO, READ BACK BY AND VERIFIED WITH: P ASIDI RN 03/27/20 0354 JDW Performed at [facility], [place] [address] [place], [place] [address]    Report Status 03/28/2020 FINAL  Final  Acid Fast Smear (AFB)     Status: None  Collection Time: 03/28/20 12:00 PM   Specimen: Sputum  Result Value Ref Range Status   AFB Specimen Processing Concentration  Final   Acid Fast Smear Negative  Final    Comment: (NOTE) Performed At: [facility] [place], [place] [id number] [name] MD [id number]    Source (AFB) SPU  Final    Comment: Performed at [facility], [place] [address], [place], [place] [address]  Acid Fast Smear (AFB)     Status: None   Collection Time: 03/29/20  1:56 PM   Specimen: Sputum  Result Value Ref Range Status   AFB Specimen Processing Concentration  Final   Acid Fast Smear Negative  Final    Comment: (NOTE) Performed At: [facility] [place], [place] [id number] [name] MD [id number]    Source (AFB) SPUTUM  Final     Comment: Performed at [facility], [place] [address], [place], [place] [address]  CSF culture w Gram Stain     Status: None (Preliminary result)   Collection Time: 03/29/20  4:24 PM   Specimen: CSF; Cerebrospinal Fluid  Result Value Ref Range Status   Specimen Description CSF  Final   Special Requests SAMPLE IN FRIDGE PRIOR TO ADDING ON CSF CULTURE  Final   Gram Stain   Final    WBC PRESENT,BOTH PMN AND MONONUCLEAR CYTOSPIN SMEAR CORRECTED RESULTS NO ORGANISMS SEEN PREVIOUSLY REPORTED AS: CRYPTOCOCCUS NEOFORMANS CORRECTED RESULTS CALLED TO: RN Davy Pique B 0820 [id number] FCP    Culture   Final    NO GROWTH 3 DAYS Performed at [facility], [place] [address] [place], [place] [address]    Report Status PENDING  Incomplete  Expectorated Sputum Assessment w Gram Stain, Rflx to Resp Cult     Status: None   Collection Time: 03/30/20 10:50 AM   Specimen: Expectorated Sputum  Result Value Ref Range Status   Specimen Description EXPECTORATED SPUTUM  Final   Special Requests NONE  Final   Sputum evaluation   Final    Sputum specimen not acceptable for testing.  Please recollect.   RESULT CALLED TO, READ BACK BY AND VERIFIED WITH: ORODOSA RN 03/31/20 0539 JDW Performed at [facility] [address] [place], [place] [address]    Report Status 03/31/2020 FINAL  Final  Expectorated Sputum Assessment w Gram Stain, Rflx to Resp Cult     Status: None   Collection Time: 03/31/20  9:12 AM   Specimen: Expectorated Sputum  Result Value Ref Range Status   Specimen Description EXPECTORATED SPUTUM  Final   Special Requests Immunocompromised  Final   Sputum evaluation   Final    Sputum specimen not acceptable for testing.  Please recollect.   Gram Stain Report Called to,Read Back By and Verified With: RN A LEWIS AT 1717 03/31/2020 BY L BENFIELD Performed at [facility], [place] [address] [place], [place] [address]    Report Status 03/31/2020 FINAL  Final      Oswald Hillock   [facility] If 7PM-7AM, please contact night-coverage at [email or web address], Office  [phone]   04/01/2020, 11:10 AM  LOS: 6 days

## 2020-04-01 NOTE — Progress Notes (Signed)
°   ° °  [facility]  Date of Admission:  03/26/2020           Reason for visit: Follow up on cryptococcal meningitis  Current antibiotics: Amphotericin B 2/21--present Flucytosine 2/21--present TMP-SMX SS daily for PCP prophylaxis Azithromycin 1200 mg weekly for MAC prophylaxis  Principal Problem:   Cryptococcal meningitis ([place]) Active Problems:   Leukocytopenia   Macrocytosis   Moderate protein malnutrition (HCC)   Hypoalbuminemia   Human immunodeficiency virus (HIV) disease (HCC)   Atrial flutter (HCC)   Aortic atherosclerosis (HCC)   Hypertension   SVT (supraventricular tachycardia) (HCC)   Abnormal EKG   Acute intractable headache   Smoking   Cocaine use   ASSESSMENT:    Cryptococcal meningitis HIV disease AKI 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  Still has elevated opening pressure on last LP with some continued headache complaints.  Would repeat LP tomorrow and sooner if he has any worsening symptoms today. Replete electrolytes as needed.  Continue fluids with amphotericin Continue OI prophylaxis Respiratory cultures pending Holding ART until after completion of induction phase Dr. Gale Journey to assume care tomorrow  SUBJECTIVE:   24 hour events:  No acute events noted overnight Afebrile Renal function improved Continues on treatment for cryptococcal meningitis and prophylaxis for PCP and MAC  Patient complains of some minor discomfort behind his eye.  He had a headache last night but this is improved this morning.  He currently feels pretty good.   OBJECTIVE:   Blood pressure 124/83, pulse 61, temperature 98.5 F (36.9 C), temperature source Oral, resp. rate 20, height _0  (1.854 m), weight 80.8 kg, SpO2 97 %. Body mass index is 23.5 kg/m.  Physical Exam  General: Alert,  cooperative, [name], non-tender, [name], clubbing, [name], [name], strength grossly intact   Lab Results: Lab Results  Component Value Date   WBC 4.6 03/31/2020   HGB 12.6 (L) 03/31/2020   HCT 35.3 (L) 03/31/2020   MCV 94.9 03/31/2020   PLT 329 03/31/2020    Lab Results  Component Value Date   NA 133 (L) 04/01/2020   K 4.2 04/01/2020   CO2 22 04/01/2020   GLUCOSE 112 (H) 04/01/2020   BUN 14 04/01/2020   CREATININE 1.08 04/01/2020   CALCIUM 8.8 (L) 04/01/2020   GFRNONAA >60 04/01/2020    Lab Results  Component Value Date   ALT 17 03/31/2020   AST 24 03/31/2020   ALKPHOS 100 03/31/2020   BILITOT 0.8 03/31/2020    No results found for: CRP     Component Value Date/Time   ESRSEDRATE 30 (H) 03/26/2020 5449     I have reviewed the micro and lab results in Epic.  Imaging: No results found.   Imaging  independently reviewed in Epic.    Raynelle Highland for Infectious Disease [facility] [phone] pager 04/01/2020, 10:41 AM

## 2020-04-01 NOTE — Plan of Care (Signed)

## 2020-04-01 NOTE — Progress Notes (Signed)
Neurology progress note   Subjective: Overall feeling better.  Reports some pressure behind the right eye but no throbbing headaches.  Objective: Current vital signs: BP 124/83 (BP Location: Right Arm)   Pulse 61   Temp 98.5 F (36.9 C) (Oral)   Resp 20   Ht 6\' 1"  (1.854 m)   Wt 80.8 kg   SpO2 97%   BMI 23.50 kg/m  Vital signs in last 24 hours: Temp:  [97.8 F (36.6 C)-98.5 F (36.9 C)] 98.5 F (36.9 C) (02/27 0807) Pulse Rate:  [55-62] 61 (02/27 0807) Resp:  [18-20] 20 (02/27 0807) BP: (119-130)/(73-99) 124/83 (02/27 0807) SpO2:  [97 %-99 %] 97 % (02/27 0807)  Physical exam General: NAD HEENT-  Normocephalic, no lesions, without obvious abnormality.   Cardiovascular- S1-S2 audible, regular rate and rhythm Lungs-respiratory effort Abdomen-soft, nontender, nondistended Musculoskeletal-no joint tenderness, deformity or swelling Skin-warm and dry  Neurologic Exam: Mental Status: Alert, oriented, thought content appropriate.  Speech fluent without evidence of aphasia.  Able to follow 3 step commands without difficulty. Cranial Nerves: II:  Visual fields grossly normal, pupils equal, round, reactive to light and accommodation III,IV, VI: ptosis not present, extra-ocular motions intact bilaterally V,VII: smile symmetric, facial light touch sensation normal bilaterally VIII: hearing normal bilaterally IX,X: uvula rises symmetrically XI: bilateral shoulder shrug XII: midline tongue extension without atrophy or fasciculations  Motor:5/5 in all muscle groups. Tone and bulk:normal tone throughout; no atrophy noted Sensory: Pinprick and light touch intact throughout, bilaterally Deep Tendon Reflexes: 2+ UE. 1+ LE Plantars: Right: downgoing   Left: downgoing Cerebellar: normal finger-to-nose bilaterally Gait: Deferred  Lab Results: Basic Metabolic Panel: Recent Labs  Lab 03/26/20 1829 03/27/20 0336 03/28/20 0428  03/29/20 0255 03/30/20 0401 03/31/20 0259 04/01/20 0227  NA  --    < > 133* 131* 131* 130* 133*  K  --    < > 4.2 4.0 3.8 4.0 4.2  CL  --    < > 99 96* 101 102 103  CO2  --    < > 25 27 22  21* 22  GLUCOSE  --    < > 115* 105* 107* 102* 112*  BUN  --    < > 13 8 14 13 14   CREATININE  --    < > 0.96 1.06 1.16 1.05 1.08  CALCIUM  --    < > 8.6* 9.0 8.7* 8.8* 8.8*  MG 1.8   < > 1.9 1.9 2.2 1.8 2.2  PHOS 2.9  --   --   --   --   --   --    < > = values in this interval not displayed.   CBC: Recent Labs  Lab 03/26/20 0958 03/27/20 0336 03/30/20 0401 03/31/20 0259  WBC 3.7* 5.4 3.9* 4.6  NEUTROABS 2.2  --   --   --   HGB 13.9 14.3 13.3 12.6*  HCT 42.3 42.4 38.0* 35.3*  MCV 101.2* 99.8 96.0 94.9  PLT 325 324 321 329   Imaging: CT Venogram 03/26/2020 IMPRESSION: 1. Small focal hypodensity along the superior aspect of the superior sagittal sinus, possibly extradural in location and not typical in appearance/morphology for acute thrombus. Per Dr. Sherry Ruffing an MRI with and without contrast has already been ordered, which will further evaluate. 2. Ethmoid air cell mucosal thickening without air-fluid levels.  MR Brain w wo contrast 03/26/2020 IMPRESSION: 1. No evidence of acute nonvascular abnormality. 2. The area of abnormality seen on same-day CTV is poorly characterized on  this study secondary to motion and flow related artifact. While the appearance on the CTV was atypical in appearance/morphology for acute thrombus, a follow up CTV in approximately one week could evaluate for change if the patient's symptoms do not improve or worsen.  CT Head wo contrast 2/24/20222 IMPRESSION: No acute intracranial process. Minimal sinus disease.   Medications:  Current Outpatient Medications  Medication Instructions  . amoxicillin-clavulanate (AUGMENTIN) 875-125 MG tablet 1 tablet, Oral, 2 times daily, One po bid x 7 days  . benzonatate (TESSALON) 100 mg, Oral, Every 8 hours     Assessment: 65 year old with h/o HIV for over 20 years admitted after progressive, constant headache for 2 to 3 weeks and admitted with cryptococcal meningitis s/p LP on 03/27/2020 and 03/29/2020.  Do not see a need for LP today. Headache likely secondary to cryptococcal meningitis and increased ICP.  Last spinal tap with opening pressure of 26 done on 03/29/2020.  Crypto antigen remains positive on last CSF.   Impression: -Cryptococcal meningitis -Headache, likely due to Cryptococcal meningitis -HIV, was previously on medication, awaiting t/t of CM.   Recommendations: -Continue antifungal treatment as per ID and primary team -If has worsening headache, might need repeat LP.  No indication of an LP from an increased ICP or headache worsening standpoint clinically today. -If he needs further more LPs, consideration might have to be given to a lumbar drain.  Discussed with Dr. Juleen China, ID via secure chat.  -- Amie Portland, MD Neurologist [facility] Pager: [phone]

## 2020-04-02 ENCOUNTER — Telehealth: Payer: Self-pay

## 2020-04-02 DIAGNOSIS — B20 Human immunodeficiency virus [HIV] disease: Secondary | ICD-10-CM | POA: Diagnosis not present

## 2020-04-02 DIAGNOSIS — I10 Essential (primary) hypertension: Secondary | ICD-10-CM | POA: Diagnosis not present

## 2020-04-02 DIAGNOSIS — B451 Cerebral cryptococcosis: Secondary | ICD-10-CM | POA: Diagnosis not present

## 2020-04-02 LAB — CSF CELL COUNT WITH DIFFERENTIAL
Eosinophils, CSF: 0 % (ref 0–1)
Lymphs, CSF: 82 % — ABNORMAL HIGH (ref 40–80)
Monocyte-Macrophage-Spinal Fluid: 18 % (ref 15–45)
RBC Count, CSF: 3 /mm3 — ABNORMAL HIGH
Segmented Neutrophils-CSF: 0 % (ref 0–6)
Tube #: 3
WBC, CSF: 45 /mm3 (ref 0–5)

## 2020-04-02 LAB — BASIC METABOLIC PANEL
Anion gap: 8 (ref 5–15)
BUN: 15 mg/dL (ref 8–23)
CO2: 22 mmol/L (ref 22–32)
Calcium: 8.7 mg/dL — ABNORMAL LOW (ref 8.9–10.3)
Chloride: 104 mmol/L (ref 98–111)
Creatinine, Ser: 1.13 mg/dL (ref 0.61–1.24)
GFR, Estimated: >60 mL/min (ref >60)
Glucose, Bld: 123 mg/dL — ABNORMAL HIGH (ref 70–99)
Potassium: 4 mmol/L (ref 3.5–5.1)
Sodium: 134 mmol/L — ABNORMAL LOW (ref 135–145)

## 2020-04-02 LAB — MAGNESIUM: Magnesium: 1.8 mg/dL (ref 1.7–2.4)

## 2020-04-02 MED ORDER — MAGNESIUM SULFATE 2 GM/50ML IV SOLN
2.0000 g | Freq: Once | INTRAVENOUS | Status: AC
  Administered 2020-04-02: 2 g via INTRAVENOUS
  Filled 2020-04-02: qty 50

## 2020-04-02 NOTE — Progress Notes (Signed)
Neurology Progress Note  S: No overnight events. He feels a lot better compared to admission. Patient is a poor historian and vague when asked to define his symptoms. C/o pain behind right eye and calls this leftover pain from original pain. HAs, if he has them, occur in the pm. These feel more throbbing in nature than sharp. He says he has blurred vision in the OD, but states he has always had issues with his OD from getting hit in his eye playing basketball years ago. No n/v or pain elsewhere. Last had Tylenol and Oxycodone at 2029 hrs and and 1900 hrs 04/01/20, respectively.   Patient states he is open to r/p LP.   O: Current vital signs: BP 126/83 (BP Location: Right Arm)   Pulse 71   Temp 98 F (36.7 C) (Oral)   Resp 18   Ht 6\' 1"  (1.854 m)   Wt 80.8 kg   SpO2 97%   BMI 23.50 kg/m  Vital signs in last 24 hours: Temp:  [97.5 F (36.4 C)-98.7 F (37.1 C)] 98 F (36.7 C) (02/28 0724) Pulse Rate:  [59-71] 71 (02/28 0724) Resp:  [17-20] 18 (02/28 0724) BP: (114-150)/(70-102) 126/83 (02/28 0724) SpO2:  [96 %-99 %] 97 % (02/28 0724)  GENERAL: Awake, alert in NAD HEENT: Normocephalic and atraumatic. No pain with palpation to forehead.  LUNGS: Normal respiratory effort.  CV: RRR Ext: warm  NEURO:  Mental Status: AA&Ox3  Speech/Language: speech is without aphasia or dysarthria.  Naming, repetition, fluency, and comprehension intact.  Cranial Nerves:  II: PERRL. Visual fields full.  III, IV, VI: EOMI. Eyelids elevate symmetrically.  V: Sensation is intact to light touch and symmetrical to face.  VII: Smile is symmetrical. Able to puff cheeks and raise eyebrows.  VIII: hearing intact to voice. IX, X: Palate elevates symmetrically. Phonation is normal.  ZO:[id number] shrug 5/5. XII: tongue is midline without fasciculations. Motor: 5/5 strength to all muscle groups tested.  Tone: is normal and bulk is normal Sensation- Intact to light touch bilaterally. Extinction absent to light  touch to DSS.    Coordination: FTN intact bilaterally. No drift.  Gait- deferred  Medications  Current Facility-Administered Medications:  .  acetaminophen (TYLENOL) tablet 650 mg, 650 mg, Oral, Q6H PRN, 650 mg at 04/01/20 2029 **OR** acetaminophen (TYLENOL) suppository 650 mg, 650 mg, Rectal, Q6H PRN, Reubin Milan, MD .  acetaminophen (TYLENOL) tablet 650 mg, 650 mg, Oral, Daily PRN, Reubin Milan, MD, 650 mg at 03/27/20 0981 .  amphotericin B liposome (AMBISOME) 300 mg in dextrose 5 % 500 mL IVPB, 300 mg, Intravenous, Q24H, Reubin Milan, MD, Last Rate: 287.5 mL/hr at 04/01/20 2010, 300 mg at 04/01/20 2010 .  azithromycin (ZITHROMAX) tablet 1,200 mg, 1,200 mg, Oral, Weekly, Carlyle Basques, MD, 1,200 mg at 03/28/20 1100 .  dextromethorphan (DELSYM) 30 MG/5ML liquid 30 mg, 30 mg, Oral, BID, Rizwan, Saima, MD, 30 mg at 04/01/20 2141 .  dextrose 5 % 10 mL, 10 mL, Intravenous, Q24H, Rizwan, Saima, MD, 10 mL at 04/01/20 1855 .  dextrose 5 % 10 mL, 10 mL, Intravenous, Q24H, Rizwan, Saima, MD, 10 mL at 04/01/20 2221 .  diltiazem (CARDIZEM) tablet 30 mg, 30 mg, Oral, Q8H, Tolia, Sunit, DO, 30 mg at 04/02/20 0520 .  diphenhydrAMINE (BENADRYL) injection 25 mg, 25 mg, Intravenous, Daily PRN **OR** diphenhydrAMINE (BENADRYL) capsule 25 mg, 25 mg, Oral, Daily PRN, Reubin Milan, MD .  flucytosine (ANCOBON) capsule 2,000 mg, 25 mg/kg, Oral, Q6H, Olevia Bowens,  Gerri Lins, MD, 2,000 mg at 04/02/20 0520 .  ibuprofen (ADVIL) tablet 400 mg, 400 mg, Oral, Q4H PRN, Debbe Odea, MD, 400 mg at 03/31/20 1503 .  ondansetron (ZOFRAN) tablet 4 mg, 4 mg, Oral, Q6H PRN **OR** ondansetron (ZOFRAN) injection 4 mg, 4 mg, Intravenous, Q6H PRN, Reubin Milan, MD .  oxyCODONE (Oxy IR/ROXICODONE) immediate release tablet 5 mg, 5 mg, Oral, Q6H PRN, Debbe Odea, MD, 5 mg at 04/01/20 1900 .  sodium chloride 0.9 % bolus 500 mL, 500 mL, Intravenous, Q24H, Millen, Jessica B, RPH, 500 mL at 04/01/20 2325 .   sodium chloride 0.9 % bolus 750 mL, 750 mL, Intravenous, Q24H, Hall, Carole N, DO, 750 mL at 04/01/20 1748 .  sulfamethoxazole-trimethoprim (BACTRIM) 400-80 MG per tablet 1 tablet, 1 tablet, Oral, Daily, Carlyle Basques, MD, 1 tablet at 04/01/20 0929  Pertinent Labs: CSF on 03/29/20 WBC PRESENT,BOTH PMN AND MONONUCLEAR  CYTOSPIN SMEAR  CORRECTED RESULTS NO ORGANISMS SEEN  PREVIOUSLY REPORTED AS: CRYPTOCOCCUS NEOFORMANS  CORRECTED RESULTS CALLED TO: RN SONYA B 0820 [id number] FCP    Culture NO GROWTH 3 DAYS   Opening pressure 26 on 03/29/20  Imaging MD has reviewed images in epic and the results pertinent to this consultation are:  CT Head--no acute abnormality  MRI Brain  1. No evidence of acute nonvascular abnormality. 2. The area of abnormality seen on same-day CTV is poorly characterized on this study secondary to motion and flow related artifact. While the appearance on the CTV was atypical in appearance/morphology for acute thrombus, a follow up CTV in approximately one week could evaluate for change if the patient's symptoms do not improve or worsen.  Assessment: 65 yo male who was admitted one week ago with HA and found on LP to be CSF cryptococcal antigen positive. His infection and HIV has been managed by primary team and ID. His HAs have decreased overall with a chronic blurred vision in the OD. He has had 2 LPs with opening pressure falling from 27 to 26.   Impression: 1. Cryptococcal meningitis  2. HIV  Plan:  1. We will r/p LP today as Up to Date recommends r/p LPs until opening pressure is normal/<25.   2. Continue management of HIV and infection by primary team and ID as you are doing.    Pt seen by Clance Boll, MSN, APN-BC/Nurse Practitioner/Neuro and later by MD. Note and plan to be edited as needed by MD.  Pager: [phone]  Is seen the patient reviewed the above note.  Given that his previous LPs have all demonstrated elevated pressures, I do think that I  would favor continuing until he has a normal opening pressure.  Roland Rack, MD [facility] [phone]  If 7pm- 7am, please page neurology on call as listed in [place].

## 2020-04-02 NOTE — Telephone Encounter (Signed)
-----   Message from Kem Boroughs sent at 03/30/2020 11:59 AM EST ----- Regarding: toc Scheduled for 05/11/20 10:30

## 2020-04-02 NOTE — Progress Notes (Signed)
Antibiotic Monitoring  HPI: Pt is a 64 YOM with PMH significant for HIV who came in 2/21 complaining of 2 week h/o of HA, and increasing sinus pressure, found to have cryptococcal meningitis. Patient started on amphotericin B and flucytosine 2/21.   CD4 count <35; VL 25,300 (2/23)   Scr stable at 1.13 K 4.0 - No replacement today Mg 1.8  - 2 gm IV x 1   Antimicrobials: - amphotericin B 2/21-c  - flucytosine 2/21-c - Azithromycin 1,200 mg weekly for MAC prophylaxis   Plan: - continue amphotericin B and flucytosine for 2 weeks of induction (stop date 04/09/20); adjustments per ID team  - Continue  pre-fluid NS  750 mL and post fluid at 500 mL  - Mg 2 gm IV x 1  - monitor Scr daily - Monitor CBC Q72H for signs of neutropenia     Jimmy Footman, PharmD, BCPS, BCIDP Infectious Diseases Clinical Pharmacist Phone: [phone] 04/02/2020 10:40 AM

## 2020-04-02 NOTE — Procedures (Signed)
LUMBAR PUNCTURE (SPINAL TAP) PROCEDURE NOTE  Indication: cryptococcal meningitis   Proceduralists: Dr. Leonel Ramsay, Clance Boll, NP   Risks of the procedure were dicussed with the patient including post-LP headache, bleeding, infection, weakness/numbness of legs(radiculopathy), death.    Consent obtained from: patient   Procedure Note The patient was prepped and draped, and using sterile technique a 20 gauge quinke spinal needle was inserted in the L4-5 space.   Opening pressure was 14 cm H2O.  Approximately 4 cc of CSF were obtained and sent for analysis.  Patient tolerated the procedure well and blood loss was minimal.   I was present for the entire procedure.  Roland Rack, MD [facility] [phone]  If 7pm- 7am, please page neurology on call as listed in [place].

## 2020-04-02 NOTE — Plan of Care (Signed)

## 2020-04-02 NOTE — Progress Notes (Signed)
[facility]  Date of Admission:  03/26/2020           Reason for visit: Follow up on cryptococcal meningitis  ABX: 2/21-c Amphotericin B 2/21-c Flucytosine 2/21  TMP-SMX SS daily for PCP prophylaxis; hold 2/28 while on amphotericin Azithromycin 1200 mg weekly for MAC prophylaxis  Principal Problem:   Cryptococcal meningitis ([place]) Active Problems:   Leukocytopenia   Macrocytosis   Moderate protein malnutrition (HCC)   Hypoalbuminemia   Human immunodeficiency virus (HIV) disease (HCC)   Atrial flutter (HCC)   Aortic atherosclerosis (HCC)   Hypertension   SVT (supraventricular tachycardia) (HCC)   Abnormal EKG   Acute intractable headache   Smoking   Cocaine use   Assessment 65 yo male with hiv/aids here for cryptococcal meningitis  #Cryptococcal meningitis On induction phase of tx with ampho/flucytosine. Plan 2 weeks at least prior to transitioning to consolidation fluconazole; day 1 = 2/21. 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   -send  histo/blasto serology; send hepatitis serology -daily magnesium/potassium level/renal function while on amphotericin -hold bactrim prophy for now while on amphotericin -continue azith weekly for mac prophylaxis (although could hold for now if severe gi sx precludes intake of other meds/nutrition) -given headache/crypt meningitis, holding ART until after completion of induction phase   --------------------------------- Subjective Afebrile No n/v No headache, visual/hearing disturbance Labs reviewed Cr stable 1.1; mg, k acceptable levels  Getting another LP today by neurology    Objective Blood pressure 126/83, pulse 71, temperature 98 F (36.7 C), temperature source Oral, resp. rate 18, height _0  (1.854 m), weight 80.8 kg, SpO2 97 %. Body mass index is 23.5 kg/m.  Physical Exam  General: Alert, cooperative, [name], non-tender, [name], clubbing, [name], [name], strength grossly intact   Lab Results: Lab Results  Component Value Date   WBC 4.6 03/31/2020   HGB 12.6 (L) 03/31/2020   HCT 35.3 (L) 03/31/2020   MCV 94.9 03/31/2020   PLT 329 03/31/2020    Lab Results  Component Value Date   NA 134 (L) 04/02/2020   K 4.0 04/02/2020   CO2 22 04/02/2020   GLUCOSE 123 (H) 04/02/2020   BUN 15 04/02/2020   CREATININE 1.13 04/02/2020   CALCIUM 8.7 (L) 04/02/2020   GFRNONAA >60 04/02/2020    Lab Results  Component Value Date   ALT 17 03/31/2020   AST 24 03/31/2020  ALKPHOS 100 03/31/2020   BILITOT 0.8 03/31/2020    No results found for: CRP     Component Value Date/Time   ESRSEDRATE 30 (H) 03/26/2020 0958     Serology: 2/24 csf CrAg titer 1:640 2/21 csf vdrl, hsv pcr negative 2/21 csf CrAg titer 1:2560  HIV            VL   /    CD4 (%) 02/22        25k   /    <35 (2)  HIV genotype pending from 2/24  Micro: 2/26 (& 2/22, 23, 25) sputum cx not acceptable 2/24 csf cx yeast 2/24 sputum afb smear negative; cx in progress 2/24 sputum fungal stain negative; fungal cx in progress 2/23 sputum afb smear negative; cx in progress 2/23 sputum fungal stain negative; cx in progress 2/21 csf bacterial cx cryptococcus neoforman; [name]; [name], [name], possibly reactive. Again, [name], as clinically indicated. 4. Areas of volume loss and bronchiectasis in the left upper and left lower lobes may be due to resolving infection/post infectious scarring. 5. Chronic calcific pancreatitis. 6.  Aortic atherosclerosis 7.  Emphysema  [facility] [phone] pager 04/02/2020, 10:05 AM

## 2020-04-02 NOTE — Progress Notes (Signed)
[facility]  PROGRESS NOTE  Ricky [name] BJY:[id number] DOB: 1955/03/13 DOA: 03/26/2020 PCP: Pcp, No   Brief HPI:   65 year old male with history of HIV who presented to hospital with multiple complaints including dizziness, blurred vision, sinus pressure and photophobia.  There was suspicion that patient may have meningitis, LP was performed in the ED.  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  LP findings were consistent with positive cryptococcal antigen and cryptococcal antigen titer of 2560.  Opening pressure was 27, closing pressure 21.  Patient started on amphotericin B, methylprednisolone and flucytosine in the ED. Also in the ED he was found to have SVT and was started on Cardizem infusion.  He was mildly leukopenic with WBC count of 3.7.  Subjective   No acute issues or events overnight denies nausea vomiting diarrhea constipation headache fevers or chills.   Assessment/Plan:   Cryptococcal meningitis, POA - Continue antifungal treatment with Ampho B per ID. Headache has improved. If headache recurs may need repeat LP or LP drain as per ID.  SVT-resolved - Spontaneously converted to normal sinus rhythm. TSH within normal range. 2D echo showed normal EF, undetermined diastolic parameters and dilatation of both atria.  Continue Cardizem 30 mg 3 times daily for rate control.  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 - Azithromycin/Bactrim as below for prophylaxis given AIDS diagnosis and low CD4 count.  Hypovolemic hyponatremia - Serum sodium level has improved to 133.  Serum osmolality 283, urine osmolality 367.   Hypertension - Blood pressure stable, continue Cardizem  Moderate protein calorie malnutrition - Continue diet dietary supplements.  BMI 23.5 kg/m  scale.  AIDS  - CD4 count less than 35, patient says he was on medications previously but over the past 1 year he has not taken medications.  He does not have a PCP and does not follow ID physician.  ID following.  Continue Bactrim for PCP prophylaxis, azithromycin for MAC prophylaxis.  Covid 19 positive history - Positive 03/03/20 - outside of quarantine window at this time, no indication for treatment  Scheduled medications:   . azithromycin  1,200 mg Oral Weekly  . dextromethorphan  30 mg Oral BID  . dextrose  10 mL Intravenous Q24H  . dextrose  10 mL Intravenous Q24H  . diltiazem  30 mg Oral Q8H  . flucytosine  25 mg/kg Oral Q6H  . sodium chloride  500 mL Intravenous Q24H  . sodium chloride  750 mL Intravenous Q24H  . sulfamethoxazole-trimethoprim  1 tablet Oral Daily   CBC: Recent Labs  Lab 03/26/20 0958 03/27/20 0336 03/30/20 0401 03/31/20 0259  WBC 3.7* 5.4 3.9* 4.6  NEUTROABS 2.2  --   --   --   HGB 13.9 14.3 13.3 12.6*  HCT 42.3 42.4 38.0* 35.3*  MCV 101.2* 99.8 96.0 94.9  PLT 325 324 321 086    Basic Metabolic Panel: Recent Labs  Lab 03/26/20 1829 03/27/20 0336 03/29/20 0255 03/30/20 0401 03/31/20 0259 04/01/20 0227 04/02/20 0208  NA  --    < > 131* 131* 130* 133* 134*  K  --    < > 4.0 3.8 4.0 4.2 4.0  CL  --    < > 96* 101 102 103 104  CO2  --    < > 27 22 21* 22 22  GLUCOSE  --    < > 105* 107* 102* 112* 123*  BUN  --    < > _0 CREATININE  --    < > 1.06 1.16 1.05 1.08 1.13  CALCIUM  --    < > 9.0 8.7* 8.8* 8.8* 8.7*  MG 1.8   < > 1.9 2.2 1.8 2.2 1.8  PHOS 2.9  --   --   --   --   --   --    < > = values in this interval not displayed.   Liver Function Tests: Recent Labs  Lab 03/27/20 0336 03/28/20 0428 03/29/20 0255 03/30/20 0401 03/31/20 0259  AST _1 ALT _2 ALKPHOS 95 92 93 82 100  BILITOT 1.0 0.9 1.0 1.2 0.8  PROT 7.6 7.1 7.7 7.3 7.2  ALBUMIN 3.0* 2.9* 3.0* 2.8* 2.8*    Antibiotics: Anti-infectives (From admission, onward)   Start     Dose/Rate Route Frequency Ordered Stop   03/28/20 1000  azithromycin (ZITHROMAX) tablet 1,200 mg        1,200 mg Oral Weekly 03/28/20 0912     03/28/20 1000  sulfamethoxazole-trimethoprim (BACTRIM) 400-80 MG per tablet 1 tablet        1 tablet Oral Daily 03/28/20 0912     03/27/20 2115  amphotericin B liposome (AMBISOME) 300 mg in dextrose 5 % 500 mL IVPB  Status:  Discontinued        300 mg 287.5 mL/hr over 120 Minutes Intravenous Every 24 hours 03/26/20 2024 03/26/20 2227   03/26/20 2227  amphotericin B liposome (AMBISOME) 300 mg in dextrose 5 % 500 mL IVPB        300 mg 287.5 mL/hr over 120 Minutes Intravenous Every 24 hours 03/26/20 2227     03/26/20 1945  amphotericin B liposome (AMBISOME) 300 mg in dextrose 5 % 500 mL IVPB  Status:  Discontinued        300 mg 287.5 mL/hr over 120 Minutes Intravenous Every 24 hours 03/26/20 1850 03/26/20 2024   03/26/20 1900  flucytosine (ANCOBON) capsule 2,000 mg        25 mg/kg  80 kg Oral Every 6 hours 03/26/20 1850       DVT prophylaxis: SCDs Code Status: Full code Family Communication: No family present  Consultants:  ID  Nephrology  Procedures:  Lumbar puncture 03/27/20  Objective   Vitals:   04/01/20 1957 04/01/20 2335 04/02/20 0349 04/02/20 0724  BP: (!) 150/97 (!) 144/102 132/75 126/83  Pulse: 69 68 63 71  Resp: _3 Temp: 98.7 F (37.1 C) 98 F (36.7 C) 98 F (36.7 C) 98 F (36.7 C)  TempSrc: Oral Oral Oral Oral  SpO2: 99% 96% 97% 97%  Weight:      Height:        Intake/Output Summary (Last 24 hours) at 04/02/2020 0727 Last data filed at 04/02/2020 0725 Gross per 24 hour  Intake 297 ml  Output 4275 ml  Net -3978 ml    02/26 1901 - 02/28 0700 In: 297 [P.O.:297] Out: 6755 [Urine:5755]  Filed Weights   03/26/20 1839 03/27/20 0141  Weight: 80 kg 80.8 kg    Physical Examination:   General-appears in no acute  distress  Heart-S1-S2, regular, no murmur auscultated  Lungs-clear to auscultation bilaterally, no wheezing or crackles auscultated  Abdomen-soft, nontender, no organomegaly  Extremities-no edema in the lower extremities  Neuro-alert, oriented x3, no focal deficit noted  Status is: Inpatient  Dispo: The patient is from: Home              Anticipated d/c is to: Home              Anticipated d/c date is: >3 days              Patient currently not medically stable for discharge  Barrier to discharge - ongoing treatment for cryptococcal meningitis  Data Reviewed:   Recent Results (from the past 240 hour(s))  CSF culture     Status: Abnormal   Collection Time: 03/26/20  5:05 PM   Specimen: CSF; Cerebrospinal Fluid  Result Value Ref Range Status   Specimen Description CSF  Final   Special Requests NONE  Final   Gram Stain   Final    WBC PRESENT,BOTH PMN AND MONONUCLEAR CRYPTOCOCCUS NEOFORMANS CYTOSPIN SMEAR Performed at [facility], [address] [place], [place] [address]    Culture CRYPTOCOCCUS NEOFORMANS (A)  Final   Report Status 03/29/2020 FINAL  Final  Fungus Culture With Stain     Status: None (Preliminary result)   Collection Time: 03/26/20  5:05 PM   Specimen: Lumbar Puncture; Cerebrospinal Fluid  Result Value Ref Range Status   Fungus Stain Final report  Final    Comment: (NOTE) Performed At: [facility] [address] [place], [place] [id number] [name] MD [id number]    Fungus (Mycology) Culture PENDING  Incomplete   Fungal Source CSF  Final    Comment: Performed at [facility], [place] [address] [place], [place] [address]  Fungus Culture Result     Status: None   Collection Time: 03/26/20  5:05 PM  Result Value Ref Range Status   Result 1 Comment  Final    Comment: (NOTE) KOH/Calcofluor preparation:  no fungus observed. Performed At: [facility] [place], [place] [id number] [name] MD  [id number]   Expectorated Sputum Assessment w Gram Stain, Rflx to Resp Cult     Status: None   Collection Time: 03/27/20  8:23 PM   Specimen: Sputum  Result Value Ref Range Status   Specimen Description SPUTUM  Final   Special Requests NONE  Final   Sputum evaluation   Final    Sputum specimen not acceptable for testing.  Please recollect.   RESULT CALLED TO, READ BACK BY AND VERIFIED WITH: Radene Knee RN 03/27/20 2225 JDW Performed at [facility], [place] [address], [place], [place] [address]    Report Status 03/27/2020 FINAL  Final  Expectorated Sputum Assessment w Gram Stain, Rflx to Resp Cult     Status: None   Collection Time: 03/28/20  1:45 AM   Specimen: Sputum  Result Value Ref Range Status   Specimen Description SPUTUM  Final   Special Requests NONE  Final   Sputum evaluation   Final    Sputum specimen not acceptable for testing.  Please recollect.   RESULT CALLED TO, READ BACK BY AND VERIFIED WITH: P ASIDI RN 03/27/20 0354 JDW Performed at [facility], [place] [address] [place], [place] [address]    Report Status 03/28/2020 FINAL  Final  Acid Fast Smear (AFB)     Status: None   Collection Time: 03/28/20 12:00 PM   Specimen: Sputum  Result Value Ref Range Status   AFB Specimen Processing Concentration  Final   Acid Fast Smear Negative  Final    Comment: (NOTE) Performed At: [facility] [place], [name]    Source (AFB) SPU  Final    Comment: Performed at [facility], [place] [address] [place], [place] [address]  Acid Fast Smear (AFB)     Status: None   Collection Time: 03/29/20  1:56 PM   Specimen: Sputum  Result Value Ref Range Status   AFB Specimen Processing Concentration  Final   Acid Fast Smear Negative  Final    Comment: (NOTE) Performed At: [facility] [place], [name]    Source (AFB) SPUTUM  Final    Comment:  Performed at [facility], [place] [address], [place], [place] [address]  CSF culture w Gram Stain     Status: None (Preliminary result)   Collection Time: 03/29/20  4:24 PM   Specimen: CSF; Cerebrospinal Fluid  Result Value Ref Range Status   Specimen Description CSF  Final   Special Requests SAMPLE IN FRIDGE PRIOR TO ADDING ON CSF CULTURE  Final   Gram Stain   Final    WBC PRESENT,BOTH PMN AND MONONUCLEAR CYTOSPIN SMEAR CORRECTED RESULTS NO ORGANISMS SEEN PREVIOUSLY REPORTED AS: CRYPTOCOCCUS NEOFORMANS CORRECTED RESULTS CALLED TO: RN Davy Pique B 0820 [id number] FCP    Culture   Final    NO GROWTH 3 DAYS Performed at [facility], [place] [address], [place], [place] [address]    Report Status PENDING  Incomplete  Expectorated Sputum Assessment w Gram Stain, Rflx to Resp Cult     Status: None   Collection Time: 03/30/20 10:50 AM   Specimen: Expectorated Sputum  Result Value Ref Range Status   Specimen Description EXPECTORATED SPUTUM  Final   Special Requests NONE  Final   Sputum evaluation   Final    Sputum specimen not acceptable for testing.  Please recollect.   RESULT CALLED TO, READ BACK BY AND VERIFIED WITH: ORODOSA RN 03/31/20 0539 JDW Performed at [place] [address] [place], [place] [address]    Report Status 03/31/2020 FINAL  Final  Expectorated Sputum Assessment w Gram Stain, Rflx to Resp Cult     Status: None   Collection Time: 03/31/20  9:12 AM   Specimen: Expectorated Sputum  Result Value Ref Range Status   Specimen Description EXPECTORATED SPUTUM  Final   Special Requests Immunocompromised  Final   Sputum evaluation   Final    Sputum specimen not acceptable for testing.  Please recollect.   Gram Stain Report Called to,Read Back By and Verified With: RN A LEWIS AT 1717 03/31/2020 BY L BENFIELD Performed at [facility], [place] [address] [place], [place] [address]    Report Status 03/31/2020 FINAL  Final    Little Ishikawa DO  [facility]  If 7PM-7AM, please contact night-coverage at [email or web address],  Office  [phone]  04/02/2020, 7:27 AM  LOS: 7 days

## 2020-04-03 DIAGNOSIS — N179 Acute kidney failure, unspecified: Secondary | ICD-10-CM | POA: Diagnosis not present

## 2020-04-03 DIAGNOSIS — J984 Other disorders of lung: Secondary | ICD-10-CM | POA: Diagnosis not present

## 2020-04-03 DIAGNOSIS — B451 Cerebral cryptococcosis: Secondary | ICD-10-CM | POA: Diagnosis not present

## 2020-04-03 DIAGNOSIS — B20 Human immunodeficiency virus [HIV] disease: Secondary | ICD-10-CM | POA: Diagnosis not present

## 2020-04-03 DIAGNOSIS — I10 Essential (primary) hypertension: Secondary | ICD-10-CM | POA: Diagnosis not present

## 2020-04-03 LAB — HEPATITIS A ANTIBODY, TOTAL: hep A Total Ab: REACTIVE — AB

## 2020-04-03 LAB — HEPATITIS B SURFACE ANTIGEN: Hepatitis B Surface Ag: NONREACTIVE

## 2020-04-03 LAB — PATHOLOGIST SMEAR REVIEW

## 2020-04-03 LAB — CBC
HCT: 36.2 % — ABNORMAL LOW (ref 39.0–52.0)
Hemoglobin: 13 g/dL (ref 13.0–17.0)
MCH: 34.2 pg — ABNORMAL HIGH (ref 26.0–34.0)
MCHC: 35.9 g/dL (ref 30.0–36.0)
MCV: 95.3 fL (ref 80.0–100.0)
Platelets: 384 10*3/uL (ref 150–400)
RBC: 3.8 MIL/uL — ABNORMAL LOW (ref 4.22–5.81)
RDW: 11.4 % — ABNORMAL LOW (ref 11.5–15.5)
WBC: 3.6 10*3/uL — ABNORMAL LOW (ref 4.0–10.5)
nRBC: 0 % (ref 0.0–0.2)

## 2020-04-03 LAB — BASIC METABOLIC PANEL
Anion gap: 8 (ref 5–15)
BUN: 15 mg/dL (ref 8–23)
CO2: 22 mmol/L (ref 22–32)
Calcium: 9.1 mg/dL (ref 8.9–10.3)
Chloride: 103 mmol/L (ref 98–111)
Creatinine, Ser: 1.27 mg/dL — ABNORMAL HIGH (ref 0.61–1.24)
GFR, Estimated: >60 mL/min (ref >60)
Glucose, Bld: 109 mg/dL — ABNORMAL HIGH (ref 70–99)
Potassium: 4.7 mmol/L (ref 3.5–5.1)
Sodium: 133 mmol/L — ABNORMAL LOW (ref 135–145)

## 2020-04-03 LAB — HEPATITIS C ANTIBODY: HCV Ab: NONREACTIVE

## 2020-04-03 LAB — HEPATITIS B SURFACE ANTIBODY, QUANTITATIVE: Hep B S AB Quant (Post): <3.1 m[IU]/mL — ABNORMAL LOW (ref >9.9)

## 2020-04-03 LAB — MAGNESIUM: Magnesium: 2 mg/dL (ref 1.7–2.4)

## 2020-04-03 LAB — HEPATITIS B CORE ANTIBODY, TOTAL: Hep B Core Total Ab: REACTIVE — AB

## 2020-04-03 MED ORDER — SODIUM CHLORIDE 0.9 % IV BOLUS FOR AMBISOME
750.0000 mL | INTRAVENOUS | Status: DC
  Administered 2020-04-03 – 2020-04-11 (×10): 750 mL via INTRAVENOUS

## 2020-04-03 NOTE — Progress Notes (Signed)
[facility]  Date of Admission:  03/26/2020           Reason for visit: Follow up on cryptococcal meningitis  ABX: 2/21-c Amphotericin B 2/21-c Flucytosine 2/21  TMP-SMX SS daily for PCP prophylaxis; hold 2/28 while on amphotericin Azithromycin 1200 mg weekly for MAC prophylaxis  Principal Problem:   Cryptococcal meningitis ([place]) Active Problems:   Leukocytopenia   Macrocytosis   Moderate protein malnutrition (HCC)   Hypoalbuminemia   AIDS (acquired immune deficiency syndrome) (HCC)   Atrial flutter (HCC)   Aortic atherosclerosis (HCC)   Hypertension   SVT (supraventricular tachycardia) (HCC)   Abnormal EKG   Acute intractable headache   Smoking   Cocaine use   Assessment 65 yo male with hiv/aids here for cryptococcal meningitis  #Cryptococcal meningitis On induction phase of tx with ampho/flucytosine. Plan 2 weeks at least prior to transitioning to consolidation fluconazole; day 1 = 2/21. 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  #nodular cavitary process on chest ct Wide ddx in this patient with aids including infectious, hiv related noninfectious ([place]) and malignancy Unclear prior TB process if was cavitary/nodular as well Pending fungal/afb sputum cx Will add serology for endemic fungi as well  #AIDS/HIV disease Long dx; [name]; previously seeking care in [place], [name]; vl at dx 35k Delaying early ART in setting cryptococcal meningitis  #AKI Would hold bactrim while on amphotericin  #History of TB Treated through the [facility], 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   -f/u histo/blasto serology;  send hepatitis serology -daily magnesium/potassium level/renal function while on amphotericin -hold bactrim prophy for now while on amphotericin -continue azith weekly for mac prophylaxis (although could hold for now if severe gi sx precludes intake of other meds/nutrition)   --------------------------------- Subjective Afebrile No n/v No headache, visual/hearing disturbance LP yesterday OP 14    Objective Blood pressure 132/89, pulse 67, temperature 98 F (36.7 C), temperature source Oral, resp. rate 18, height _0  (1.854 m), weight 80.8 kg, SpO2 97 %. Body mass index is 23.5 kg/m.  Physical Exam  General: Alert, cooperative, [name], non-tender, [name], clubbing, [name], [name], strength grossly intact   Lab Results: Lab Results  Component Value Date   WBC 3.6 (L) 04/03/2020   HGB 13.0 04/03/2020   HCT 36.2 (L) 04/03/2020   MCV 95.3 04/03/2020   PLT 384 04/03/2020    Lab Results  Component Value Date   NA 133 (L) 04/03/2020   K 4.7 04/03/2020   CO2 22 04/03/2020   GLUCOSE 109 (H) 04/03/2020   BUN 15 04/03/2020   CREATININE 1.27 (H) 04/03/2020   CALCIUM 9.1 04/03/2020   GFRNONAA >60 04/03/2020    Lab Results  Component Value Date   ALT 17 03/31/2020   AST 24 03/31/2020   ALKPHOS 100 03/31/2020   BILITOT 0.8 03/31/2020    No results found for: CRP     Component Value Date/Time   ESRSEDRATE 30 (H)  03/26/2020 2683     Serology: 2/24 csf CrAg titer 1:640 2/21 csf vdrl, [name], 23, 25) sputum cx not acceptable 2/24 csf cx yeast 2/24 sputum afb smear negative; cx  in progress 2/24 sputum fungal stain negative; fungal cx in progress 2/23 sputum afb smear negative; cx in progress 2/23 sputum fungal stain negative; cx in progress 2/21 csf bacterial cx cryptococcus neoforman; [name]; [name], [name], possibly reactive. Again, [name], as clinically indicated. 4. Areas of volume loss and bronchiectasis in the left upper and left lower lobes may be due to resolving infection/post infectious scarring. 5. Chronic calcific pancreatitis. 6.  Aortic atherosclerosis 7.  Emphysema  [facility] [phone] pager 04/03/2020, 1:11 PM

## 2020-04-03 NOTE — Progress Notes (Signed)
Neurology Progress Note  S: No overnight events. Overall, he says he is much better than when he arrived. HAs none except maybe one in the afternoon. He is pleased with his progress. No new vision changes. C/o some dizziness associated with standing.   O: Current vital signs: BP 125/87 (BP Location: Right Arm)   Pulse (!) 54   Temp 97.6 F (36.4 C) (Oral)   Resp 18   Ht 6\' 1"  (1.854 m)   Wt 80.8 kg   SpO2 98%   BMI 23.50 kg/m  Vital signs in last 24 hours: Temp:  [97.6 F (36.4 C)-98.3 F (36.8 C)] 97.6 F (36.4 C) (03/01 0329) Pulse Rate:  [54-71] 54 (03/01 0329) Resp:  [18] 18 (03/01 0329) BP: (117-134)/(74-92) 125/87 (03/01 0329) SpO2:  [97 %-100 %] 98 % (03/01 0329)  GENERAL: Awake, alert in NAD HEENT: Normocephalic and atraumatic LUNGS: Normal respiratory effort.  CV: RRR ABDOMEN: Soft, nontender Ext: warm   NEURO:  Mental Status: AA&Ox3  Speech/Language: speech is without aphasia or dysarthria.  Naming, repetition, fluency, and comprehension intact.  Cranial Nerves:  II: PERRL. Visual fields full.  III, IV, VI: EOMI. Eyelids elevate symmetrically.  V: Sensation is intact to light touch and symmetrical to face.  VII: Smile is symmetrical. Able to puff cheeks and raise eyebrows.  VIII: hearing intact to voice. IX, X: Palate elevates symmetrically. Phonation is normal.  SW:NIOEVOJJ shrug 5/5. XII: tongue is midline without fasciculations. Motor: 5/5 strength to all muscle groups tested.  Tone: is normal and bulk is normal Sensation- Intact to light touch bilaterally. Extinction absent to light touch to DSS.    Coordination: FTN intact bilaterally. No drift.  Gait- deferred  Medications  Current Facility-Administered Medications:  .  acetaminophen (TYLENOL) tablet 650 mg, 650 mg, Oral, Q6H PRN, 650 mg at 04/01/20 2029 **OR** acetaminophen (TYLENOL) suppository 650 mg, 650 mg, Rectal, Q6H PRN, Reubin Milan, MD .  acetaminophen (TYLENOL) tablet 650 mg,  650 mg, Oral, Daily PRN, Reubin Milan, MD, 650 mg at 03/27/20 0093 .  amphotericin B liposome (AMBISOME) 300 mg in dextrose 5 % 500 mL IVPB, 300 mg, Intravenous, Q24H, Reubin Milan, MD, Stopped at 04/02/20 2221 .  azithromycin (ZITHROMAX) tablet 1,200 mg, 1,200 mg, Oral, Weekly, Carlyle Basques, MD, 1,200 mg at 03/28/20 1100 .  dextromethorphan (DELSYM) 30 MG/5ML liquid 30 mg, 30 mg, Oral, BID, Rizwan, Saima, MD, 30 mg at 04/02/20 2020 .  dextrose 5 % 10 mL, 10 mL, Intravenous, Q24H, Rizwan, Saima, MD, 10 mL at 04/02/20 1912 .  dextrose 5 % 10 mL, 10 mL, Intravenous, Q24H, Rizwan, Saima, MD, 10 mL at 04/02/20 2225 .  diltiazem (CARDIZEM) tablet 30 mg, 30 mg, Oral, Q8H, Tolia, Sunit, DO, 30 mg at 04/02/20 2020 .  diphenhydrAMINE (BENADRYL) injection 25 mg, 25 mg, Intravenous, Daily PRN **OR** diphenhydrAMINE (BENADRYL) capsule 25 mg, 25 mg, Oral, Daily PRN, Reubin Milan, MD .  flucytosine (ANCOBON) capsule 2,000 mg, 25 mg/kg, Oral, Q6H, Reubin Milan, MD, 2,000 mg at 04/03/20 0514 .  ibuprofen (ADVIL) tablet 400 mg, 400 mg, Oral, Q4H PRN, Debbe Odea, MD, 400 mg at 04/02/20 2020 .  ondansetron (ZOFRAN) tablet 4 mg, 4 mg, Oral, Q6H PRN **OR** ondansetron (ZOFRAN) injection 4 mg, 4 mg, Intravenous, Q6H PRN, Reubin Milan, MD .  oxyCODONE (Oxy IR/ROXICODONE) immediate release tablet 5 mg, 5 mg, Oral, Q6H PRN, Debbe Odea, MD, 5 mg at 04/02/20 2311 .  sodium chloride 0.9 %  bolus 500 mL, 500 mL, Intravenous, Q24H, Millen, Jessica B, RPH, 500 mL at 04/02/20 2314 .  sodium chloride 0.9 % bolus 750 mL, 750 mL, Intravenous, Q24H, Hall, Carole N, DO, 750 mL at 04/02/20 1807  Pertinent Labs 04/02/20 CSF-Opening pressure 14    Clear   WBCC 45    RBCC  3    Lymphs   82  Culture pending  Imaging No new imaging  Assessment: 65 yo male who presented with HA, dizziness and blurry vision for 2-3 weeks. Found to have cryptococcal meningitis. Has had 3 LPs with opening pressure of  27, 26, 14 respectively. His HA has almost completely dissipated. Vast improvement overall compared to when he arrived. He has chronic blurred vision to the OD due to basketball injury. He also had HIV and ID has followed.    Impression:  1. Cryptococcal meningitis-with fast improvement of HA 2. HIV  Plan:  F/up culture of CSF performed 04/02/20. He will need no further LPs as opening pressure is normal now.  HIV and treatment for meningitis per ID and primary team.  Check orthostatic VS since dizzy with standing.   Neurology will be available for questions or if patient declines.    Pt seen by Clance Boll, MSN, APN-BC/Nurse Practitioner/Neuro

## 2020-04-03 NOTE — Progress Notes (Signed)
Antibiotic Monitoring  HPI: Pt is a 27 YOM with PMH significant for HIV who came in 2/21 complaining of 2 week h/o of HA, and increasing sinus pressure, found to have cryptococcal meningitis. Patient started on amphotericin B and flucytosine 2/21.   CD4 count <35; VL 25,300 (2/23)   Scr slightly increased from 1.13 to 1.27 today will continue to monitor closely K 4.7 - No replacement today Mg 2.0 - No replacement today  Antimicrobials: - amphotericin B 2/21-c  - flucytosine 2/21-c - Azithromycin 1,200 mg weekly for MAC prophylaxis   Plan: - continue amphotericin B and flucytosine for 2 weeks of induction (stop date 04/09/20); adjustments per ID team  - Continue  pre-fluid NS  750 mL and increase post fluid to 750 mL  - monitor Scr daily - Monitor CBC Q72H for signs of neutropenia    Nicoletta Dress, PharmD, BCIDP Infectious Disease Pharmacist  Phone: [phone] 04/03/2020 1:24 PM

## 2020-04-03 NOTE — Progress Notes (Signed)
[facility]  PROGRESS NOTE  Ricky [name] BEM:[id number] DOB: [birth year] DOA: 03/26/2020 PCP: Pcp, No   Brief HPI:   65 year old male with history of HIV who presented to hospital with multiple complaints including dizziness, blurred vision, sinus pressure and photophobia.  There was suspicion that patient may have meningitis, LP was performed in the ED.  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  LP findings were consistent with positive cryptococcal antigen and cryptococcal antigen titer of 2560.  Opening pressure was 27, closing pressure 21.  Patient started on amphotericin B, methylprednisolone and flucytosine in the ED. Also in the ED he was found to have SVT and was started on Cardizem infusion.  He was mildly leukopenic with WBC count of 3.7.  Subjective   No acute issues or events overnight denies nausea vomiting diarrhea constipation headache fevers or chills.   Assessment/Plan:   Cryptococcal meningitis, POA - Continue antifungal treatment with Ampho B per ID. Headache has improved -status post 3 LPs since admission with decreasing opening pressure.   -Neurology recommending against further LP at this time given decreased opening pressure. - LP 2/28 results/culture pending  SVT-resolved - Spontaneously converted to normal sinus rhythm. TSH within normal range. 2D echo showed normal EF, undetermined diastolic parameters and dilatation of both atria.  Continue Cardizem 30 mg 3 times daily for rate control.  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 - Azithromycin/Bactrim as below for prophylaxis given AIDS diagnosis and low CD4 count.  Hypovolemic hyponatremia - Serum sodium level has improved to 133, continue to follow -Increasing p.o. intake daily essentially back to  baseline   Hypertension - Blood pressure stable, continue Cardizem  Moderate protein calorie malnutrition - Continue diet dietary supplements.  BMI 23.5 kg/m scale.  AIDS  - CD4 count less than 35, patient says he was on medications previously but over the past 1 year he has not taken medications.  He does not have a PCP and does not follow ID physician.  ID following -recommend holding Bactrim while on ampho (PCP prophylaxis), azithromycin 1.2g weekly for MAC prophylaxis ongoing.  Covid 19 positive history; not acutely infected or contagious - Positive 03/03/20 - outside of quarantine window at this time, no indication for treatment  Scheduled medications:   . azithromycin  1,200 mg Oral Weekly  . dextromethorphan  30 mg Oral BID  . dextrose  10 mL Intravenous Q24H  . dextrose  10 mL Intravenous Q24H  . diltiazem  30 mg Oral Q8H  . flucytosine  25 mg/kg Oral Q6H  . sodium chloride  500 mL Intravenous Q24H  . sodium chloride  750 mL Intravenous Q24H   CBC: Recent Labs  Lab 03/30/20 0401 03/31/20 0259 04/03/20 0442  WBC 3.9* 4.6 3.6*  HGB 13.3 12.6* 13.0  HCT 38.0* 35.3* 36.2*  MCV 96.0 94.9 95.3  PLT 321 329 071    Basic Metabolic Panel: Recent Labs  Lab 03/30/20 0401 03/31/20 0259 04/01/20 0227 04/02/20 0208 04/03/20 0442  NA 131* 130* 133* 134* 133*  K 3.8 4.0 4.2 4.0 4.7  CL 101 102 103 104 103  CO2 22 21* _0 GLUCOSE 107* 102* 112* 123* 109*  BUN _1 CREATININE 1.16 1.05 1.08 1.13 1.27*  CALCIUM 8.7* 8.8* 8.8* 8.7* 9.1  MG 2.2 1.8 2.2 1.8 2.0   Liver Function Tests: Recent Labs  Lab 03/28/20 0428 03/29/20 0255 03/30/20 0401 03/31/20 0259  AST _0 ALT _1 ALKPHOS 92 93 82 100  BILITOT 0.9 1.0 1.2 0.8  PROT 7.1 7.7 7.3 7.2  ALBUMIN 2.9* 3.0* 2.8* 2.8*   Antibiotics: Anti-infectives (From admission, onward)   Start     Dose/Rate Route Frequency Ordered Stop   03/28/20 1000  azithromycin (ZITHROMAX) tablet  1,200 mg        1,200 mg Oral Weekly 03/28/20 0912     03/28/20 1000  sulfamethoxazole-trimethoprim (BACTRIM) 400-80 MG per tablet 1 tablet  Status:  Discontinued        1 tablet Oral Daily 03/28/20 0912 04/02/20 0919   03/27/20 2115  amphotericin B liposome (AMBISOME) 300 mg in dextrose 5 % 500 mL IVPB  Status:  Discontinued        300 mg 287.5 mL/hr over 120 Minutes Intravenous Every 24 hours 03/26/20 2024 03/26/20 2227   03/26/20 2227  amphotericin B liposome (AMBISOME) 300 mg in dextrose 5 % 500 mL IVPB        300 mg 287.5 mL/hr over 120 Minutes Intravenous Every 24 hours 03/26/20 2227     03/26/20 1945  amphotericin B liposome (AMBISOME) 300 mg in dextrose 5 % 500 mL IVPB  Status:  Discontinued        300 mg 287.5 mL/hr over 120 Minutes Intravenous Every 24 hours 03/26/20 1850 03/26/20 2024   03/26/20 1900  flucytosine (ANCOBON) capsule 2,000 mg        25 mg/kg  80 kg Oral Every 6 hours 03/26/20 1850       DVT prophylaxis: SCDs Code Status: Full code Family Communication: No family present  Consultants:  ID  Nephrology  Procedures:  Lumbar puncture 03/27/20  Objective   Vitals:   04/02/20 1545 04/02/20 2014 04/02/20 2331 04/03/20 0329  BP: 117/79 (!) 134/92 128/74 125/87  Pulse: 62 68 (!) 55 (!) 54  Resp: _2 Temp: 98 F (36.7 C) 97.7 F (36.5 C) 98.1 F (36.7 C) 97.6 F (36.4 C)  TempSrc: Oral Oral Oral Oral  SpO2: 98% 100% 98% 98%  Weight:      Height:        Intake/Output Summary (Last 24 hours) at 04/03/2020 0737 Last data filed at 04/03/2020 0517 Gross per 24 hour  Intake 2824.61 ml  Output 4300 ml  Net -1475.39 ml    02/27 1901 - 03/01 0700 In: 2944.6 [P.O.:1140] Out: 7000 [Urine:7000]  Filed Weights   03/26/20 1839 03/27/20 0141  Weight: 80 kg 80.8 kg    Physical Examination:  General:  Pleasantly resting in bed, No acute distress. HEENT:  Normocephalic atraumatic.  Sclerae nonicteric, noninjected.  Extraocular movements intact  bilaterally. Neck:  Without mass or deformity.  Trachea is midline. Lungs:  Clear to auscultate bilaterally without rhonchi, wheeze, or rales. Heart:  Regular rate and rhythm.  Without murmurs, rubs, or gallops. Abdomen:  Soft, nontender, nondistended.  Without guarding or rebound. Extremities: Without cyanosis, clubbing, edema, or obvious deformity. Vascular:  Dorsalis pedis and posterior tibial pulses palpable bilaterally. Skin:  Warm and dry, no erythema, no ulcerations.   Status is: Inpatient  Dispo: The patient is from: Home              Anticipated d/c is to: Home  Anticipated d/c date is: >3 days              Patient currently not medically stable for discharge  Barrier to discharge - ongoing treatment with IV antifungal for cryptococcal meningitis  Data Reviewed:   Recent Results (from the past 240 hour(s))  CSF culture     Status: Abnormal   Collection Time: 03/26/20  5:05 PM   Specimen: CSF; Cerebrospinal Fluid  Result Value Ref Range Status   Specimen Description CSF  Final   Special Requests NONE  Final   Gram Stain   Final    WBC PRESENT,BOTH PMN AND MONONUCLEAR CRYPTOCOCCUS NEOFORMANS CYTOSPIN SMEAR Performed at [facility], [address], [place], [place] [address]    Culture CRYPTOCOCCUS NEOFORMANS (A)  Final   Report Status 03/29/2020 FINAL  Final  Fungus Culture With Stain     Status: None (Preliminary result)   Collection Time: 03/26/20  5:05 PM   Specimen: Lumbar Puncture; Cerebrospinal Fluid  Result Value Ref Range Status   Fungus Stain Final report  Final    Comment: (NOTE) Performed At: [facility] [address] [place], [place] [id number] [name] MD IR:[id number]    Fungus (Mycology) Culture PENDING  Incomplete   Fungal Source CSF  Final    Comment: Performed at [facility], [place] [address] [place], [place] [address]  Fungus Culture Result     Status: None   Collection Time: 03/26/20  5:05 PM   Result Value Ref Range Status   Result 1 Comment  Final    Comment: (NOTE) KOH/Calcofluor preparation:  no fungus observed. Performed At: [facility] [place], [place] [id number] [name] MD RC:[id number]   Expectorated Sputum Assessment w Gram Stain, Rflx to Resp Cult     Status: None   Collection Time: 03/27/20  8:23 PM   Specimen: Sputum  Result Value Ref Range Status   Specimen Description SPUTUM  Final   Special Requests NONE  Final   Sputum evaluation   Final    Sputum specimen not acceptable for testing.  Please recollect.   RESULT CALLED TO, READ BACK BY AND VERIFIED WITH: Radene Knee RN 03/27/20 2225 JDW Performed at [facility], [place] [address], [place], [place] [address]    Report Status 03/27/2020 FINAL  Final  Expectorated Sputum Assessment w Gram Stain, Rflx to Resp Cult     Status: None   Collection Time: 03/28/20  1:45 AM   Specimen: Sputum  Result Value Ref Range Status   Specimen Description SPUTUM  Final   Special Requests NONE  Final   Sputum evaluation   Final    Sputum specimen not acceptable for testing.  Please recollect.   RESULT CALLED TO, READ BACK BY AND VERIFIED WITH: P ASIDI RN 03/27/20 0354 JDW Performed at [facility], [place] [address], [place], [place] [address]    Report Status 03/28/2020 FINAL  Final  Acid Fast Smear (AFB)     Status: None   Collection Time: 03/28/20 12:00 PM   Specimen: Sputum  Result Value Ref Range Status   AFB Specimen Processing Concentration  Final   Acid Fast Smear Negative  Final    Comment: (NOTE) Performed At: [facility] [address] [place], [place] [id number] [name] MD ER:[id number]    Source (AFB) SPU  Final    Comment: Performed at [facility], [place] [address] [place], [place] [address]  Fungus Culture With Stain     Status: Abnormal (Preliminary result)   Collection Time: 03/28/20 12:00 PM   Specimen: Sputum  Result Value Ref Range Status    Fungus Stain Final report (A)  Final    Comment: (NOTE) Performed At: [facility] [place], [place] [id number] [name] MD [id number]    Fungus (Mycology) Culture PENDING  Incomplete   Fungal Source SPU  Final    Comment: Performed at [facility], [place] [address] [place], [place] [address]  Fungus Culture Result     Status: Abnormal   Collection Time: 03/28/20 12:00 PM  Result Value Ref Range Status   Result 1 Comment (A)  Final    Comment: (NOTE) Fungal elements, such as arthroconidia, hyphal fragments, chlamydoconidia, observed. Performed At: [facility] [place], [place] [id number] [name] MD DZ:[id number]   Acid Fast Smear (AFB)     Status: None   Collection Time: 03/29/20  1:56 PM   Specimen: Sputum  Result Value Ref Range Status   AFB Specimen Processing Concentration  Final   Acid Fast Smear Negative  Final    Comment: (NOTE) Performed At: [facility] [place], [place] [id number] [name] MD LG:[id number]    Source (AFB) SPUTUM  Final    Comment: Performed at [facility], [place] [address] [place], [place] [address]  Fungus Culture With Stain     Status: None (Preliminary result)   Collection Time: 03/29/20  1:56 PM  Result Value Ref Range Status   Fungus Stain Final report  Final    Comment: (NOTE) Performed At: [facility] [place], [place] [id number] [name] MD WY:[id number]    Fungus (Mycology) Culture PENDING  Incomplete   Fungal Source SPUTUM  Final    Comment: Performed at [facility], [place] [address] [place], [place] [address]  Fungus Culture Result     Status: None   Collection Time: 03/29/20  1:56 PM  Result Value Ref Range Status   Result 1 Comment  Final    Comment: (NOTE) KOH/Calcofluor preparation:  no fungus observed. Performed At: [facility] [place], [place] [id number] [name] MD NO:[id number]   CSF culture w Gram Stain     Status: None (Preliminary result)   Collection Time: 03/29/20  4:24 PM   Specimen: CSF; Cerebrospinal Fluid  Result Value Ref Range Status   Specimen Description CSF  Final   Special Requests SAMPLE IN FRIDGE PRIOR TO ADDING ON CSF CULTURE  Final   Gram Stain   Final    WBC PRESENT,BOTH PMN AND MONONUCLEAR CYTOSPIN SMEAR CORRECTED RESULTS NO ORGANISMS SEEN PREVIOUSLY REPORTED AS: CRYPTOCOCCUS NEOFORMANS CORRECTED RESULTS CALLED TO: RN Davy Pique B 0820 [id number] FCP    Culture   Final    YEAST CULTURE REINCUBATED FOR BETTER GROWTH Performed at [facility], [place] [address] [place], [place] [address]    Report Status PENDING  Incomplete  Expectorated Sputum Assessment w Gram Stain, Rflx to Resp Cult     Status: None   Collection Time: 03/30/20 10:50 AM   Specimen: Expectorated Sputum  Result Value Ref Range Status   Specimen Description EXPECTORATED SPUTUM  Final   Special Requests NONE  Final   Sputum evaluation   Final    Sputum specimen not acceptable for testing.  Please recollect.   RESULT CALLED TO, READ BACK BY AND VERIFIED WITH:  ORODOSA RN 03/31/20 0539 JDW Performed at [place] [address], [place], [place] [address]    Report Status 03/31/2020 FINAL  Final  Expectorated Sputum Assessment w Gram Stain, Rflx to Resp Cult     Status: None   Collection Time: 03/31/20  9:12 AM   Specimen: Expectorated Sputum  Result Value Ref Range Status   Specimen Description EXPECTORATED SPUTUM  Final   Special Requests Immunocompromised  Final   Sputum evaluation   Final    Sputum specimen not acceptable for testing.  Please recollect.   Gram Stain Report Called to,Read Back By and Verified With: RN A LEWIS AT 1717 03/31/2020 BY L BENFIELD Performed at [facility], [place] [address], [place], [place] [address]    Report Status 03/31/2020 FINAL  Final  CSF culture w Gram Stain     Status: None (Preliminary result)    Collection Time: 04/02/20  3:00 PM   Specimen: CSF  Result Value Ref Range Status   Specimen Description CSF  Final   Special Requests NONE  Final   Gram Stain   Final    WBC PRESENT, PREDOMINANTLY MONONUCLEAR NO ORGANISMS SEEN CYTOSPIN SMEAR Performed at [facility], [place] [address], [place], [place] [address]    Culture PENDING  Incomplete   Report Status PENDING  Incomplete    Little Ishikawa DO  [facility]  If 7PM-7AM, please contact night-coverage at [email or web address],  Office  [phone]  04/03/2020, 7:37 AM  LOS: 8 days

## 2020-04-03 NOTE — Telephone Encounter (Signed)
Patient still admitted in the hospital. No Vmbox to leave a message.

## 2020-04-04 DIAGNOSIS — B451 Cerebral cryptococcosis: Secondary | ICD-10-CM | POA: Diagnosis not present

## 2020-04-04 DIAGNOSIS — I10 Essential (primary) hypertension: Secondary | ICD-10-CM | POA: Diagnosis not present

## 2020-04-04 DIAGNOSIS — B20 Human immunodeficiency virus [HIV] disease: Secondary | ICD-10-CM | POA: Diagnosis not present

## 2020-04-04 LAB — CSF CULTURE W GRAM STAIN

## 2020-04-04 LAB — BASIC METABOLIC PANEL
Anion gap: 9 (ref 5–15)
BUN: 19 mg/dL (ref 8–23)
CO2: 19 mmol/L — ABNORMAL LOW (ref 22–32)
Calcium: 8.8 mg/dL — ABNORMAL LOW (ref 8.9–10.3)
Chloride: 103 mmol/L (ref 98–111)
Creatinine, Ser: 1.21 mg/dL (ref 0.61–1.24)
GFR, Estimated: >60 mL/min (ref >60)
Glucose, Bld: 128 mg/dL — ABNORMAL HIGH (ref 70–99)
Potassium: 5.5 mmol/L — ABNORMAL HIGH (ref 3.5–5.1)
Sodium: 131 mmol/L — ABNORMAL LOW (ref 135–145)

## 2020-04-04 LAB — POTASSIUM: Potassium: 3.9 mmol/L (ref 3.5–5.1)

## 2020-04-04 LAB — CBC
HCT: 38.2 % — ABNORMAL LOW (ref 39.0–52.0)
Hemoglobin: 13.1 g/dL (ref 13.0–17.0)
MCH: 33.2 pg (ref 26.0–34.0)
MCHC: 34.3 g/dL (ref 30.0–36.0)
MCV: 96.7 fL (ref 80.0–100.0)
Platelets: 379 10*3/uL (ref 150–400)
RBC: 3.95 MIL/uL — ABNORMAL LOW (ref 4.22–5.81)
RDW: 11.4 % — ABNORMAL LOW (ref 11.5–15.5)
WBC: 4.8 10*3/uL (ref 4.0–10.5)
nRBC: 0 % (ref 0.0–0.2)

## 2020-04-04 LAB — HISTOPLASMA ANTIGEN, URINE: Histoplasma Antigen, urine: <0.5 (ref <0.5)

## 2020-04-04 LAB — MAGNESIUM: Magnesium: 2 mg/dL (ref 1.7–2.4)

## 2020-04-04 MED ORDER — POTASSIUM CHLORIDE CRYS ER 20 MEQ PO TBCR
40.0000 meq | EXTENDED_RELEASE_TABLET | Freq: Once | ORAL | Status: AC
  Administered 2020-04-04: 40 meq via ORAL
  Filled 2020-04-04: qty 4

## 2020-04-04 NOTE — Progress Notes (Signed)
Antibiotic Monitoring  HPI: Pt is a 9 YOM with PMH significant for HIV who came in 2/21 complaining of 2 week h/o of HA, and increasing sinus pressure, found to have cryptococcal meningitis. Patient started on amphotericin B and flucytosine 2/21.   CD4 count <35; VL 25,300 (2/23)   Scr stable at 1.21 today will continue to monitor closely K 3.9 on repeat, was originally 5.5 but likely hemolyzed - K 40 mEq x1 Mg 2.0 - No replacement today  Antimicrobials: - amphotericin B 2/21-c  - flucytosine 2/21-c - Azithromycin 1,200 mg weekly for MAC prophylaxis   Plan: - continue amphotericin B and flucytosine for 2 weeks of induction (stop date 04/09/20); adjustments per ID team  - Continue  pre-fluid NS  750 mL and post fluid to 750 mL  - K 40 mEq x1 - monitor Scr daily - Monitor CBC Q72H for signs of neutropenia    Nicoletta Dress, PharmD, BCIDP Infectious Disease Pharmacist  Phone: [phone] 04/04/2020 8:57 AM

## 2020-04-04 NOTE — Progress Notes (Signed)
PROGRESS NOTE        PATIENT DETAILS Name: Ricky [name] Age: 65 y.o. Sex: male Date of Birth: [birth year] Admit Date: 03/26/2020 Admitting Physician Reubin Milan, MD PCP:Pcp, No  Brief Narrative: Patient is a 65 y.o. 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 2/22>> CT chest with contrast: Nodular/cavitary airspace consolidation in the left upper/lower lobes 2/22>> Echo: EF 50-55% 2/24>> CT head: No acute intracranial process 2/24>> nuclear stress test: No ischemia  Antimicrobial therapy: Amphotericin/flucytosine: 2/21>>  Microbiology data: 2/21>> CSF culture: Cryptococcus neoformans 2/23>> sputum AFB smear negative 2/24>> sputum AFB smear: Negative 2/24>> CSF culture: Cryptococcus neoformans 2/28>> CSF culture: No growth  Procedures : LP>> 2/22, 2/24, 2/28  Consults: Neurology, cardiology Terri Skains), ID  DVT Prophylaxis : SCDs Start: 03/26/20 1946   Subjective: No headache  Assessment/Plan: Cryptococcal meningitis: Headache better-on induction therapy with IV ampho B/flucytosine-ID following.  Left lung cavitary lesion: AFB x2 - ID following-awaiting further work-up-May require bronchoscopy at some point.  HIV/AIDS: Noncompliant-timing for initiation of ART defer to ID.  SVT: Continue telemetry monitoring-on Cardizem.  Nuclear stress test negative-echo with preserved EF.  TSH within normal limits.  Recent COVID-19 infection: Asymptomatic-out of quarantine window.   Lab Results  Component Value Date   SARSCOV2NAA POSITIVE (A) 03/03/2020    Chronic  calcific pancreatitis: Seen incidentally on CT chest  Moderate protein calorie malnutrition  History of cocaine use  Tobacco abuse   Diet: Diet Order            Diet Heart Room service appropriate? Yes; Fluid consistency: Thin  Diet effective now                  Code Status: Full code  Family Communication: Will reach out to family over the next few days.  Disposition Plan: Status is: Inpatient  Remains inpatient appropriate because:Inpatient level of care appropriate due to severity of illness   Dispo:  Patient From: Home  Planned Disposition: Home  Medically stable for discharge: No     Barriers to Discharge: Cryptococcal meningitis-induction therapy with IV amphotericin B.   Antimicrobial agents: Anti-infectives (From admission, onward)   Start     Dose/Rate Route Frequency Ordered Stop   03/28/20 1000  azithromycin (ZITHROMAX) tablet 1,200 mg        1,200 mg Oral Weekly 03/28/20 0912     03/28/20 1000  sulfamethoxazole-trimethoprim (BACTRIM) 400-80 MG per tablet 1 tablet  Status:  Discontinued        1 tablet Oral Daily 03/28/20 0912 04/02/20 0919   03/27/20 2115  amphotericin B liposome (AMBISOME) 300 mg in dextrose 5 % 500 mL IVPB  Status:  Discontinued        300 mg 287.5 mL/hr over 120 Minutes Intravenous Every 24 hours 03/26/20 2024 03/26/20 2227   03/26/20 2227  amphotericin B liposome (AMBISOME) 300 mg in dextrose 5 % 500 mL IVPB        300 mg 287.5 mL/hr over 120 Minutes Intravenous Every 24 hours 03/26/20 2227     03/26/20 1945  amphotericin B liposome (AMBISOME) 300 mg in dextrose 5 %  500 mL IVPB  Status:  Discontinued        300 mg 287.5 mL/hr over 120 Minutes Intravenous Every 24 hours 03/26/20 1850 03/26/20 2024   03/26/20 1900  flucytosine (ANCOBON) capsule 2,000 mg        25 mg/kg  80 kg Oral Every 6 hours 03/26/20 1850         Time spent: 25- minutes-Greater than 50% of this time was spent in counseling, explanation of diagnosis,  planning of further management, and coordination of care.  MEDICATIONS: Scheduled Meds: . azithromycin  1,200 mg Oral Weekly  . dextromethorphan  30 mg Oral BID  . dextrose  10 mL Intravenous Q24H  . dextrose  10 mL Intravenous Q24H  . diltiazem  30 mg Oral Q8H  . flucytosine  25 mg/kg Oral Q6H  . sodium chloride  750 mL Intravenous Q24H  . sodium chloride  750 mL Intravenous Q24H   Continuous Infusions: . amphotericin  B  Liposome (AMBISOME) ADULT IV Stopped (04/03/20 2335)   PRN Meds:.acetaminophen **OR** acetaminophen, acetaminophen, diphenhydrAMINE **OR** diphenhydrAMINE, ibuprofen, ondansetron **OR** ondansetron (ZOFRAN) IV, oxyCODONE   PHYSICAL EXAM: Vital signs: Vitals:   04/03/20 1946 04/03/20 2328 04/04/20 0400 04/04/20 0755  BP: 126/85 125/81 (!) 141/85 123/74  Pulse: 66 (!) 55 (!) 54 63  Resp: 18 18 18 20   Temp: 97.8 F (36.6 C) 97.9 F (36.6 C) 97.6 F (36.4 C) 98.1 F (36.7 C)  TempSrc: Oral Oral  Oral  SpO2: 100% 98% 98% 99%  Weight:      Height:       Filed Weights   03/26/20 1839 03/27/20 0141  Weight: 80 kg 80.8 kg   Body mass index is 23.5 kg/m.   Gen Exam:Alert awake-not in any distress HEENT:atraumatic, normocephalic Chest: B/L clear to auscultation anteriorly CVS:S1S2 regular Abdomen:soft non tender, non distended Extremities:no edema Neurology: Non focal Skin: no rash  I have personally reviewed following labs and imaging studies  LABORATORY DATA: CBC: Recent Labs  Lab 03/30/20 0401 03/31/20 0259 04/03/20 0442 04/04/20 0304  WBC 3.9* 4.6 3.6* 4.8  HGB 13.3 12.6* 13.0 13.1  HCT 38.0* 35.3* 36.2* 38.2*  MCV 96.0 94.9 95.3 96.7  PLT 321 329 384 659    Basic Metabolic Panel: Recent Labs  Lab 03/31/20 0259 04/01/20 0227 04/02/20 0208 04/03/20 0442 04/04/20 0304 04/04/20 0613  NA 130* 133* 134* 133* 131*  --   K 4.0 4.2 4.0 4.7 5.5* 3.9  CL 102 103 104 103 103  --   CO2 21* 22 22 22  19*  --   GLUCOSE 102* 112* 123* 109*  128*  --   BUN 13 14 15 15 19   --   CREATININE 1.05 1.08 1.13 1.27* 1.21  --   CALCIUM 8.8* 8.8* 8.7* 9.1 8.8*  --   MG 1.8 2.2 1.8 2.0 2.0  --     GFR: Estimated Creatinine Clearance: 71.5 mL/min (by C-G formula based on SCr of 1.21 mg/dL).  Liver Function Tests: Recent Labs  Lab 03/29/20 0255 03/30/20 0401 03/31/20 0259  AST 27 23 24   ALT 17 15 17   ALKPHOS 93 82 100  BILITOT 1.0 1.2 0.8  PROT 7.7 7.3 7.2  ALBUMIN 3.0* 2.8* 2.8*   No results for input(s): LIPASE, AMYLASE in the last 168 hours. No results for input(s): AMMONIA in the last 168 hours.  Coagulation Profile: Recent Labs  Lab 03/29/20 1312  INR 1.1    Cardiac Enzymes: No results for input(s):  CKTOTAL, CKMB, CKMBINDEX, TROPONINI in the last 168 hours.  BNP (last 3 results) No results for input(s): PROBNP in the last 8760 hours.  Lipid Profile: No results for input(s): CHOL, HDL, LDLCALC, TRIG, CHOLHDL, LDLDIRECT in the last 72 hours.  Thyroid Function Tests: No results for input(s): TSH, T4TOTAL, FREET4, T3FREE, THYROIDAB in the last 72 hours.  Anemia Panel: No results for input(s): VITAMINB12, FOLATE, FERRITIN, TIBC, IRON, RETICCTPCT in the last 72 hours.  Urine analysis: No results found for: COLORURINE, APPEARANCEUR, LABSPEC, PHURINE, GLUCOSEU, HGBUR, BILIRUBINUR, KETONESUR, PROTEINUR, UROBILINOGEN, NITRITE, LEUKOCYTESUR  Sepsis Labs: Lactic Acid, Venous    Component Value Date/Time   LATICACIDVEN 0.9 03/26/2020 0958    MICROBIOLOGY: Recent Results (from the past 240 hour(s))  CSF culture     Status: Abnormal   Collection Time: 03/26/20  5:05 PM   Specimen: CSF; Cerebrospinal Fluid  Result Value Ref Range Status   Specimen Description CSF  Final   Special Requests NONE  Final   Gram Stain   Final    WBC PRESENT,BOTH PMN AND MONONUCLEAR CRYPTOCOCCUS NEOFORMANS CYTOSPIN SMEAR Performed at [facility], [address] [place], [place] [address]    Culture CRYPTOCOCCUS  NEOFORMANS (A)  Final   Report Status 03/29/2020 FINAL  Final  Fungus Culture With Stain     Status: None (Preliminary result)   Collection Time: 03/26/20  5:05 PM   Specimen: Lumbar Puncture; Cerebrospinal Fluid  Result Value Ref Range Status   Fungus Stain Final report  Final    Comment: (NOTE) Performed At: [facility] [address] [place], [place] [id number] [name] MD [id number]    Fungus (Mycology) Culture PENDING  Incomplete   Fungal Source CSF  Final    Comment: Performed at [facility], [place] [address] [place], [place] [address]  Fungus Culture Result     Status: None   Collection Time: 03/26/20  5:05 PM  Result Value Ref Range Status   Result 1 Comment  Final    Comment: (NOTE) KOH/Calcofluor preparation:  no fungus observed. Performed At: [facility] [place], [place] [id number] [name] MD YP:[id number]   Expectorated Sputum Assessment w Gram Stain, Rflx to Resp Cult     Status: None   Collection Time: 03/27/20  8:23 PM   Specimen: Sputum  Result Value Ref Range Status   Specimen Description SPUTUM  Final   Special Requests NONE  Final   Sputum evaluation   Final    Sputum specimen not acceptable for testing.  Please recollect.   RESULT CALLED TO, READ BACK BY AND VERIFIED WITH: Radene Knee RN 03/27/20 2225 JDW Performed at [facility], [place] [address] [place], [place] [address]    Report Status 03/27/2020 FINAL  Final  Expectorated Sputum Assessment w Gram Stain, Rflx to Resp Cult     Status: None   Collection Time: 03/28/20  1:45 AM   Specimen: Sputum  Result Value Ref Range Status   Specimen Description SPUTUM  Final   Special Requests NONE  Final   Sputum evaluation   Final    Sputum specimen not acceptable for testing.  Please recollect.   RESULT CALLED TO, READ BACK BY AND VERIFIED WITH: P ASIDI RN 03/27/20 0354 JDW Performed at [facility], [place] [address], [place], [place] [address]     Report Status 03/28/2020 FINAL  Final  Acid Fast Smear (AFB)     Status: None  Collection Time: 03/28/20 12:00 PM   Specimen: Sputum  Result Value Ref Range Status   AFB Specimen Processing Concentration  Final   Acid Fast Smear Negative  Final    Comment: (NOTE) Performed At: [facility] [place], [place] [id number] [name] MD PQ:[id number]    Source (AFB) SPU  Final    Comment: Performed at [facility], [place] [address], [place], [place] [address]  Fungus Culture With Stain     Status: Abnormal (Preliminary result)   Collection Time: 03/28/20 12:00 PM   Specimen: Sputum  Result Value Ref Range Status   Fungus Stain Final report (A)  Final    Comment: (NOTE) Performed At: [facility] [address] [place], [place] [id number] [name] MD DS:[id number]    Fungus (Mycology) Culture PENDING  Incomplete   Fungal Source SPU  Final    Comment: Performed at [facility], [place] [address], [place], [place] [address]  Fungus Culture Result     Status: Abnormal   Collection Time: 03/28/20 12:00 PM  Result Value Ref Range Status   Result 1 Comment (A)  Final    Comment: (NOTE) Fungal elements, such as arthroconidia, hyphal fragments, chlamydoconidia, observed. Performed At: [facility] [place], [place] [id number] [name] MD [id number]   Acid Fast Smear (AFB)     Status: None   Collection Time: 03/29/20  1:56 PM   Specimen: Sputum  Result Value Ref Range Status   AFB Specimen Processing Concentration  Final   Acid Fast Smear Negative  Final    Comment: (NOTE) Performed At: [facility] [place], [place] [id number] [name] MD WU:[id number]    Source (AFB) SPUTUM  Final    Comment: Performed at [facility], [place] [address], [place], [place] [address]  Fungus Culture With Stain     Status: None (Preliminary result)   Collection Time: 03/29/20  1:56 PM   Result Value Ref Range Status   Fungus Stain Final report  Final    Comment: (NOTE) Performed At: [facility] [place], [place] [id number] [name] MD TA:[id number]    Fungus (Mycology) Culture PENDING  Incomplete   Fungal Source SPUTUM  Final    Comment: Performed at [facility], [place] [address] [place], [place] [address]  Fungus Culture Result     Status: None   Collection Time: 03/29/20  1:56 PM  Result Value Ref Range Status   Result 1 Comment  Final    Comment: (NOTE) KOH/Calcofluor preparation:  no fungus observed. Performed At: [facility] [address] [place], [place] [id number] [name] MD QG:[id number]   Fungus Culture With Stain     Status: None (Preliminary result)   Collection Time: 03/29/20  4:24 PM  Result Value Ref Range Status   Fungus Stain Final report  Final    Comment: (NOTE) Performed At: [facility] [address] [place], [place] [id number] [name] MD EB:[id number]    Fungus (Mycology) Culture PENDING  Incomplete   Fungal Source CSF  Final    Comment: Performed at [facility], [place] [address], [place],  [address]  CSF culture w Gram Stain     Status: Abnormal   Collection Time: 03/29/20  4:24 PM   Specimen: CSF; Cerebrospinal Fluid  Result Value Ref Range Status   Specimen Description CSF  Final   Special Requests SAMPLE IN FRIDGE  PRIOR TO ADDING ON CSF CULTURE  Final   Gram Stain   Final    WBC PRESENT,BOTH PMN AND MONONUCLEAR CYTOSPIN SMEAR CORRECTED RESULTS NO ORGANISMS SEEN PREVIOUSLY REPORTED AS: CRYPTOCOCCUS NEOFORMANS CORRECTED RESULTS CALLED TO: RN SONYA B [id number] FCP    Culture (A)  Final    CRYPTOCOCCUS NEOFORMANS CRITICAL VALUE NOTED.  VALUE IS CONSISTENT WITH PREVIOUSLY REPORTED AND CALLED VALUE. Performed at [facility], [place] [address], [place], [place] [address]    Report Status 04/04/2020 FINAL  Final  Fungus Culture Result      Status: None   Collection Time: 03/29/20  4:24 PM  Result Value Ref Range Status   Result 1 Comment  Final    Comment: (NOTE) KOH/Calcofluor preparation:  no fungus observed. Performed At: [facility] [place], [place] [id number] [name] MD EF:[id number]   Expectorated Sputum Assessment w Gram Stain, Rflx to Resp Cult     Status: None   Collection Time: 03/30/20 10:50 AM   Specimen: Expectorated Sputum  Result Value Ref Range Status   Specimen Description EXPECTORATED SPUTUM  Final   Special Requests NONE  Final   Sputum evaluation   Final    Sputum specimen not acceptable for testing.  Please recollect.   RESULT CALLED TO, READ BACK BY AND VERIFIED WITH: ORODOSA RN 03/31/20 0539 JDW Performed at [facility] [address], [place], [place] [address]    Report Status 03/31/2020 FINAL  Final  Expectorated Sputum Assessment w Gram Stain, Rflx to Resp Cult     Status: None   Collection Time: 03/31/20  9:12 AM   Specimen: Expectorated Sputum  Result Value Ref Range Status   Specimen Description EXPECTORATED SPUTUM  Final   Special Requests Immunocompromised  Final   Sputum evaluation   Final    Sputum specimen not acceptable for testing.  Please recollect.   Gram Stain Report Called to,Read Back By and Verified With: RN A LEWIS AT 1717 03/31/2020 BY L BENFIELD Performed at [facility], [place] [address], [place], [place] [address]    Report Status 03/31/2020 FINAL  Final  CSF culture w Gram Stain     Status: None (Preliminary result)   Collection Time: 04/02/20  3:00 PM   Specimen: CSF  Result Value Ref Range Status   Specimen Description CSF  Final   Special Requests NONE  Final   Gram Stain   Final    WBC PRESENT, PREDOMINANTLY MONONUCLEAR NO ORGANISMS SEEN CYTOSPIN SMEAR    Culture   Final    NO GROWTH 2 DAYS Performed at [facility], [place] [address], [place], [place] [address]    Report Status PENDING  Incomplete     RADIOLOGY STUDIES/RESULTS: No results found.   LOS: 9 days   Oren Binet, MD  [facility]    To contact the attending provider between 7A-7P or the covering provider during after hours 7P-7A, please log into the web site [email or web address] and access using universal [place] password for that web site. If you do not have the password, please call the hospital operator.  04/04/2020, 10:16 AM

## 2020-04-04 NOTE — Progress Notes (Signed)
Pt K noted to be 5.5. MD on-call paged and notified. MD returned call back and ordered for lab to recollect sample. Pt asymptomatic and resting in bed with call light within reach. Will continue to closely monitor. Delia Heady RN

## 2020-04-04 NOTE — Telephone Encounter (Signed)
3rd attempt: Patient still admitted in the hospital. No Vmbox set up to leave a message.

## 2020-04-04 NOTE — Progress Notes (Signed)
[facility] for Infectious Disease  Date of Admission:  03/26/2020           Reason for visit: Follow up on cryptococcal meningitis  ABX: 2/21-c Amphotericin B 2/21-c Flucytosine 2/21  TMP-SMX SS daily for PCP prophylaxis; hold 2/28 while on amphotericin Azithromycin 1200 mg weekly for MAC prophylaxis  Principal Problem:   Cryptococcal meningitis (Ricky [name]) Active Problems:   Leukocytopenia   Macrocytosis   Moderate protein malnutrition (HCC)   Hypoalbuminemia   AIDS (acquired immune deficiency syndrome) (HCC)   Atrial flutter (HCC)   Aortic atherosclerosis (HCC)   Hypertension   SVT (supraventricular tachycardia) (HCC)   Abnormal EKG   Acute intractable headache   Smoking   Cocaine use   Cavitary lesion of lung   Assessment 65 yo Ricky [name] with hiv/aids here for cryptococcal meningitis  #Cryptococcal meningitis On induction phase of tx with ampho/flucytosine. Plan 2 weeks at least prior to transitioning to consolidation fluconazole; day 1 = 2/21. 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  #nodular cavitary process on chest ct Wide ddx in this patient with aids including infectious, [name]; [name]; [name], [name]; 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; [name], visual/hearing disturbance   Reviewed labs    Objective Blood pressure 123/74, pulse 63, [name], temperature source Oral, resp. rate 20, height _0  (1.854 m), weight 80.8 kg, [name], cooperative, [name], non-tender, [name], clubbing, [name], [name], strength grossly intact   Lab Results: Lab Results  Component Value Date   WBC 4.8 04/04/2020   HGB 13.1 04/04/2020   HCT 38.2 (L) 04/04/2020   MCV 96.7 04/04/2020   PLT 379 04/04/2020    Lab Results  Component Value Date   NA 131 (L) 04/04/2020   K 3.9 04/04/2020   CO2 19 (L) 04/04/2020   GLUCOSE 128 (H) 04/04/2020   BUN 19 04/04/2020   CREATININE 1.21 04/04/2020   CALCIUM 8.8 (L) 04/04/2020   GFRNONAA >60 04/04/2020    Lab Results  Component Value Date   ALT 17 03/31/2020   AST 24 03/31/2020   ALKPHOS 100 03/31/2020   BILITOT 0.8 03/31/2020    No results found for: CRP  Component Value Date/Time   ESRSEDRATE 30 (H) 03/26/2020 0958     Serology: 2/24 csf CrAg titer 1:640 2/21 csf vdrl, [name], 23, 25) sputum cx not  acceptable 2/24 csf cx yeast 2/24 sputum afb smear negative; cx in progress 2/24 sputum fungal stain negative; fungal cx in progress 2/23 sputum afb smear negative; cx in progress 2/23 sputum fungal stain negative; cx in progress 2/21 csf bacterial cx cryptococcus neoforman; [name]; [name], [name], possibly reactive. Again, [name], as clinically indicated. 4. Areas of volume loss and bronchiectasis in the left upper and left lower lobes may be due to resolving infection/post infectious scarring. 5. Chronic calcific pancreatitis. 6.  Aortic atherosclerosis 7.  Emphysema  [facility] [phone] pager 04/04/2020, 11:23 AM

## 2020-04-04 NOTE — Progress Notes (Signed)
Pt recollected potassium is 3.9. reported off to oncoming RN. Delia Heady RN

## 2020-04-05 DIAGNOSIS — B451 Cerebral cryptococcosis: Secondary | ICD-10-CM | POA: Diagnosis not present

## 2020-04-05 DIAGNOSIS — J984 Other disorders of lung: Secondary | ICD-10-CM | POA: Diagnosis not present

## 2020-04-05 DIAGNOSIS — B20 Human immunodeficiency virus [HIV] disease: Secondary | ICD-10-CM | POA: Diagnosis not present

## 2020-04-05 DIAGNOSIS — I10 Essential (primary) hypertension: Secondary | ICD-10-CM | POA: Diagnosis not present

## 2020-04-05 LAB — BASIC METABOLIC PANEL
Anion gap: 8 (ref 5–15)
BUN: 15 mg/dL (ref 8–23)
CO2: 20 mmol/L — ABNORMAL LOW (ref 22–32)
Calcium: 8.8 mg/dL — ABNORMAL LOW (ref 8.9–10.3)
Chloride: 104 mmol/L (ref 98–111)
Creatinine, Ser: 1.23 mg/dL (ref 0.61–1.24)
GFR, Estimated: >60 mL/min (ref >60)
Glucose, Bld: 168 mg/dL — ABNORMAL HIGH (ref 70–99)
Potassium: 4.2 mmol/L (ref 3.5–5.1)
Sodium: 132 mmol/L — ABNORMAL LOW (ref 135–145)

## 2020-04-05 LAB — CBC
HCT: 34.3 % — ABNORMAL LOW (ref 39.0–52.0)
Hemoglobin: 12 g/dL — ABNORMAL LOW (ref 13.0–17.0)
MCH: 33.9 pg (ref 26.0–34.0)
MCHC: 35 g/dL (ref 30.0–36.0)
MCV: 96.9 fL (ref 80.0–100.0)
Platelets: 367 10*3/uL (ref 150–400)
RBC: 3.54 MIL/uL — ABNORMAL LOW (ref 4.22–5.81)
RDW: 11.5 % (ref 11.5–15.5)
WBC: 4.8 10*3/uL (ref 4.0–10.5)
nRBC: 0 % (ref 0.0–0.2)

## 2020-04-05 LAB — MAGNESIUM: Magnesium: 1.6 mg/dL — ABNORMAL LOW (ref 1.7–2.4)

## 2020-04-05 LAB — CSF CULTURE W GRAM STAIN: Culture: NO GROWTH

## 2020-04-05 MED ORDER — MAGNESIUM SULFATE 2 GM/50ML IV SOLN
2.0000 g | Freq: Once | INTRAVENOUS | Status: AC
  Administered 2020-04-05: 2 g via INTRAVENOUS
  Filled 2020-04-05: qty 50

## 2020-04-05 NOTE — Progress Notes (Signed)
Ricky Ricky [name] for Infectious Disease  Date of Admission:  03/26/2020           Reason for visit: Follow up on cryptococcal meningitis  ABX: 2/21-c Amphotericin B 2/21-c Flucytosine 2/21  TMP-SMX SS daily for PCP prophylaxis; hold 2/28 while on amphotericin Azithromycin 1200 mg weekly for MAC prophylaxis  Principal Problem:   Cryptococcal meningitis ([place]) Active Problems:   Leukocytopenia   Macrocytosis   Moderate protein malnutrition (HCC)   Hypoalbuminemia   AIDS (acquired immune deficiency syndrome) (HCC)   Atrial flutter (HCC)   Aortic atherosclerosis (HCC)   Hypertension   SVT (supraventricular tachycardia) (HCC)   Abnormal EKG   Acute intractable headache   Smoking   Cocaine use   Cavitary lesion of lung   Assessment 65 yo Ricky [name] with hiv/aids here for cryptococcal meningitis  #Cryptococcal meningitis Brain mri no acute pathology  On induction phase of tx with ampho/flucytosine. Plan 2 weeks at least prior to transitioning to consolidation fluconazole; day 1 = 2/21. 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  #nodular cavitary process on chest ct Wide ddx in this patient with aids including infectious, hiv related noninfectious ([place]) and malignancy Unclear prior TB process if was cavitary/nodular as well await fungal/afb sputum cx -- ngtd No quantiferon gold sent given prior hx tb and afb cx already cooking Urine histoplasma ag negative; blastomyces ag in progress  #AIDS/HIV disease Long dx; [name]; previously seeking care in [place], [name]; vl at dx 35k Delaying early ART in setting cryptococcal meningitis  #[employer] prophylactic bactrim while on amphotericin Cr stable  #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   -f/u histo/blasto serology; send hepatitis serology -continue to monitor daily magnesium/potassium level/renal function while on amphotericin -repeat LP on Monday 04/09/2020 and send for csf culture along with cell count/glucose/protein -hold bactrim prophy for now while on amphotericin -continue azith weekly for mac prophylaxis (although could hold for now if severe gi sx precludes intake of other meds/nutrition)  I spent more than 30 minute reviewing data/chart, and coordinating care and >50% direct face to face time providing counseling/discussing diagnostics/treatment plan with patient  --------------------------------- Subjective Afebrile No n/v No headache, visual/hearing disturbance  Remains on room air for the duration of this admission No cough, chest pain, sob   Reviewed labs    Objective Blood pressure 129/77, pulse 64, temperature (!) 97.4 F (36.3 C), temperature source Oral, resp. rate 20, height _0  (1.854 m), weight 80.8 kg, SpO2 100 %. Body mass index is 23.5 kg/m.  Physical Exam  General: Alert, cooperative, [name], non-tender, [name], clubbing, [name], [name], strength grossly intact   Lab Results: Lab Results  Component Value Date   WBC 4.8 04/05/2020   HGB 12.0 (L) 04/05/2020   HCT 34.3 (L) 04/05/2020   MCV 96.9 04/05/2020   PLT 367 04/05/2020    Lab Results  Component Value Date   NA 132 (L) 04/05/2020   K 4.2 04/05/2020   CO2 20 (L) 04/05/2020   GLUCOSE 168 (H) 04/05/2020   BUN 15 04/05/2020   CREATININE 1.23  04/05/2020   CALCIUM 8.8 (L) 04/05/2020   GFRNONAA >60 04/05/2020    Lab Results  Component Value Date   ALT 17 03/31/2020   AST 24 03/31/2020   ALKPHOS 100 03/31/2020   BILITOT 0.8 03/31/2020    No results found for: CRP     Component Value Date/Time   ESRSEDRATE 30 (H) 03/26/2020 0958     Serology: Endemic fungal serology in progress 2/24 csf CrAg titer 1:640 2/21 csf vdrl, [name], 23, 25) sputum cx not acceptable 2/24 csf cx yeast 2/24 sputum afb smear negative; cx in progress 2/24 sputum fungal stain negative; fungal cx in progress 2/23 sputum afb smear negative; cx in progress 2/23 sputum fungal stain negative; cx in progress 2/21 csf bacterial cx cryptococcus neoforman; fungal cx ngtd     Imaging: I have personally reviewed imaging/results in epic  2/21 brain mri 1. No evidence of acute nonvascular abnormality. 2. The area of abnormality seen on same-day CTV is poorly characterized on this study secondary to motion and flow related artifact. While the appearance on the CTV was atypical in appearance/morphology for acute thrombus, a follow up CTV in approximately one week could evaluate for change if the patient's symptoms do not improve or worsen.  2/24 ct head No acute process; [name], [name], possibly reactive. Again, [name], as clinically indicated. 4. Areas of volume loss and bronchiectasis in the left upper  and left lower lobes may be due to resolving infection/post infectious scarring. 5. Chronic calcific pancreatitis. 6.  Aortic atherosclerosis 7.  Emphysema  Tiney Rouge for Infectious Disease [facility] [phone] pager 04/05/2020, 12:44 PM

## 2020-04-05 NOTE — Progress Notes (Signed)
Pt brady-sinus on monitor during shift and HR down to 45 but sustained in the 50's and remained stable through the shift. Will report off to oncoming RN. Delia Heady RN

## 2020-04-05 NOTE — Progress Notes (Signed)
Antibiotic Monitoring  HPI: Pt is a 20 YOM with PMH significant for HIV who came in 2/21 complaining of 2 week h/o of HA, and increasing sinus pressure, found to have cryptococcal meningitis. Patient started on amphotericin B and flucytosine 2/21.   CD4 count <35; VL 25,300 (2/23)   Scr stable at 1.23 today will continue to monitor closely K 4.2 after 40 mEQ yesterday Mg 1.6 - 2g Mg replacement today  Antimicrobials: - amphotericin B 2/21-c  - flucytosine 2/21-c - Azithromycin 1,200 mg weekly for MAC prophylaxis   Plan: - continue amphotericin B and flucytosine for 2 weeks of induction (stop date 04/09/20); adjustments per ID team  - Continue  pre-fluid NS  750 mL and post fluid to 750 mL  - Mg 2 g x1 - monitor Scr daily - Monitor CBC Q72H for signs of neutropenia    Nicoletta Dress, PharmD, BCIDP Infectious Disease Pharmacist  Phone: [phone] 04/05/2020 9:15 AM

## 2020-04-05 NOTE — Progress Notes (Signed)
PROGRESS NOTE        PATIENT DETAILS Name: Ricky [name] Age: 65 y.o. Sex: male Date of Birth: [birth year] Admit Date: 03/26/2020 Admitting Physician Reubin Milan, MD PCP:Pcp, No  Brief Narrative: Patient is a 65 y.o. 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 2/22>> CT chest with contrast: Nodular/cavitary airspace consolidation in the left upper/lower lobes 2/22>> Echo: EF 50-55% 2/24>> CT head: No acute intracranial process 2/24>> nuclear stress test: No ischemia  Antimicrobial therapy: Amphotericin/flucytosine: 2/21>>  Microbiology data: 2/21>> CSF culture: Cryptococcus neoformans 2/23>> sputum AFB smear negative 2/24>> sputum AFB smear: Negative 2/24>> CSF culture: Cryptococcus neoformans 2/28>> CSF culture: No growth  Procedures : LP>> 2/22, 2/24, 2/28  Consults: Neurology, cardiology Terri Skains), ID  DVT Prophylaxis : SCDs Start: 03/26/20 1946   Subjective: Lying comfortably in bed-no headache.  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    Left lung cavitary lesion: AFB x2 - ID following-awaiting further work-up-May require bronchoscopy at some point.  HIV/AIDS: Noncompliant-timing for initiation of ART defer to ID.  SVT: Continue telemetry monitoring-on Cardizem.  Nuclear stress test negative-echo with preserved EF.  TSH within normal limits.  Recent COVID-19 infection: Asymptomatic-out of quarantine window.   Lab Results  Component Value Date   SARSCOV2NAA  POSITIVE (A) 03/03/2020    Chronic calcific pancreatitis: Seen incidentally on CT chest  Moderate protein calorie malnutrition  History of cocaine use  Tobacco abuse   Diet: Diet Order            Diet Heart Room service appropriate? Yes; Fluid consistency: Thin  Diet effective now                  Code Status: Full code  Family Communication: Will reach out to family over the next few days.  Disposition Plan: Status is: Inpatient  Remains inpatient appropriate because:Inpatient level of care appropriate due to severity of illness   Dispo:  Patient From: Home  Planned Disposition: Home  Medically stable for discharge: No     Barriers to Discharge: Cryptococcal meningitis-induction therapy with IV amphotericin B.   Antimicrobial agents: Anti-infectives (From admission, onward)   Start     Dose/Rate Route Frequency Ordered Stop   03/28/20 1000  azithromycin (ZITHROMAX) tablet 1,200 mg        1,200 mg Oral Weekly 03/28/20 0912     03/28/20 1000  sulfamethoxazole-trimethoprim (BACTRIM) 400-80 MG per tablet 1 tablet  Status:  Discontinued        1 tablet Oral Daily 03/28/20 0912 04/02/20 0919   03/27/20 2115  amphotericin B liposome (AMBISOME) 300 mg in dextrose 5 % 500 mL IVPB  Status:  Discontinued        300 mg 287.5 mL/hr over 120 Minutes Intravenous Every 24 hours 03/26/20 2024 03/26/20 2227   03/26/20 2227  amphotericin B liposome (AMBISOME) 300 mg in dextrose 5 % 500 mL IVPB        300 mg 287.5 mL/hr over 120 Minutes Intravenous Every 24 hours 03/26/20 2227     03/26/20 1945  amphotericin B liposome (AMBISOME)  300 mg in dextrose 5 % 500 mL IVPB  Status:  Discontinued        300 mg 287.5 mL/hr over 120 Minutes Intravenous Every 24 hours 03/26/20 1850 03/26/20 2024   03/26/20 1900  flucytosine (ANCOBON) capsule 2,000 mg        25 mg/kg  80 kg Oral Every 6 hours 03/26/20 1850         Time spent: 15- minutes-Greater than 50% of this time was spent in  counseling, explanation of diagnosis, planning of further management, and coordination of care.  MEDICATIONS: Scheduled Meds: . azithromycin  1,200 mg Oral Weekly  . dextromethorphan  30 mg Oral BID  . dextrose  10 mL Intravenous Q24H  . dextrose  10 mL Intravenous Q24H  . diltiazem  30 mg Oral Q8H  . flucytosine  25 mg/kg Oral Q6H  . sodium chloride  750 mL Intravenous Q24H  . sodium chloride  750 mL Intravenous Q24H   Continuous Infusions: . amphotericin  B  Liposome (AMBISOME) ADULT IV Stopped (04/04/20 2326)  . magnesium sulfate bolus IVPB 2 g (04/05/20 1040)   PRN Meds:.acetaminophen **OR** acetaminophen, acetaminophen, diphenhydrAMINE **OR** diphenhydrAMINE, ibuprofen, ondansetron **OR** ondansetron (ZOFRAN) IV, oxyCODONE   PHYSICAL EXAM: Vital signs: Vitals:   04/04/20 2040 04/05/20 0003 04/05/20 0442 04/05/20 0801  BP: 126/80 128/76 140/82 134/75  Pulse: 65 (!) 56 61 63  Resp: 18 18 18 18   Temp: 98.2 F (36.8 C) 97.7 F (36.5 C) 97.6 F (36.4 C) 98.2 F (36.8 C)  TempSrc: Oral Oral Oral   SpO2: 100% 99% 99% 100%  Weight:      Height:       Filed Weights   03/26/20 1839 03/27/20 0141  Weight: 80 kg 80.8 kg   Body mass index is 23.5 kg/m.   Gen Exam:Alert awake-not in any distress HEENT:atraumatic, normocephalic Chest: B/L clear to auscultation anteriorly CVS:S1S2 regular Abdomen:soft non tender, non distended Extremities:no edema Neurology: Non focal Skin: no rash  I have personally reviewed following labs and imaging studies  LABORATORY DATA: CBC: Recent Labs  Lab 03/30/20 0401 03/31/20 0259 04/03/20 0442 04/04/20 0304 04/05/20 0407  WBC 3.9* 4.6 3.6* 4.8 4.8  HGB 13.3 12.6* 13.0 13.1 12.0*  HCT 38.0* 35.3* 36.2* 38.2* 34.3*  MCV 96.0 94.9 95.3 96.7 96.9  PLT 321 329 384 379 196    Basic Metabolic Panel: Recent Labs  Lab 04/01/20 0227 04/02/20 0208 04/03/20 0442 04/04/20 0304 04/04/20 0613 04/05/20 0407  NA 133* 134* 133* 131*   --  132*  K 4.2 4.0 4.7 5.5* 3.9 4.2  CL 103 104 103 103  --  104  CO2 22 22 22  19*  --  20*  GLUCOSE 112* 123* 109* 128*  --  168*  BUN 14 15 15 19   --  15  CREATININE 1.08 1.13 1.27* 1.21  --  1.23  CALCIUM 8.8* 8.7* 9.1 8.8*  --  8.8*  MG 2.2 1.8 2.0 2.0  --  1.6*    GFR: Estimated Creatinine Clearance: 70.4 mL/min (by C-G formula based on SCr of 1.23 mg/dL).  Liver Function Tests: Recent Labs  Lab 03/30/20 0401 03/31/20 0259  AST 23 24  ALT 15 17  ALKPHOS 82 100  BILITOT 1.2 0.8  PROT 7.3 7.2  ALBUMIN 2.8* 2.8*   No results for input(s): LIPASE, AMYLASE in the last 168 hours. No results for input(s): AMMONIA in the last 168 hours.  Coagulation Profile: Recent Labs  Lab 03/29/20  1312  INR 1.1    Cardiac Enzymes: No results for input(s): CKTOTAL, CKMB, CKMBINDEX, TROPONINI in the last 168 hours.  BNP (last 3 results) No results for input(s): PROBNP in the last 8760 hours.  Lipid Profile: No results for input(s): CHOL, HDL, LDLCALC, TRIG, CHOLHDL, LDLDIRECT in the last 72 hours.  Thyroid Function Tests: No results for input(s): TSH, T4TOTAL, FREET4, T3FREE, THYROIDAB in the last 72 hours.  Anemia Panel: No results for input(s): VITAMINB12, FOLATE, FERRITIN, TIBC, IRON, RETICCTPCT in the last 72 hours.  Urine analysis: No results found for: COLORURINE, APPEARANCEUR, LABSPEC, PHURINE, GLUCOSEU, HGBUR, BILIRUBINUR, KETONESUR, PROTEINUR, UROBILINOGEN, NITRITE, LEUKOCYTESUR  Sepsis Labs: Lactic Acid, Venous    Component Value Date/Time   LATICACIDVEN 0.9 03/26/2020 0958    MICROBIOLOGY: Recent Results (from the past 240 hour(s))  CSF culture     Status: Abnormal   Collection Time: 03/26/20  5:05 PM   Specimen: CSF; Cerebrospinal Fluid  Result Value Ref Range Status   Specimen Description CSF  Final   Special Requests NONE  Final   Gram Stain   Final    WBC PRESENT,BOTH PMN AND MONONUCLEAR CRYPTOCOCCUS NEOFORMANS CYTOSPIN SMEAR Performed at [facility], [address] [place], [place] [address]    Culture CRYPTOCOCCUS NEOFORMANS (A)  Final   Report Status 03/29/2020 FINAL  Final  Fungus Culture With Stain     Status: None (Preliminary result)   Collection Time: 03/26/20  5:05 PM   Specimen: Lumbar Puncture; Cerebrospinal Fluid  Result Value Ref Range Status   Fungus Stain Final report  Final    Comment: (NOTE) Performed At: [facility] [address] [place], [place] [id number] [name] MD [id number]    Fungus (Mycology) Culture PENDING  Incomplete   Fungal Source CSF  Final    Comment: Performed at [facility], [place] [address], [place], [place] [address]  Fungus Culture Result     Status: None   Collection Time: 03/26/20  5:05 PM  Result Value Ref Range Status   Result 1 Comment  Final    Comment: (NOTE) KOH/Calcofluor preparation:  no fungus observed. Performed At: [facility] [place], [place] [id number] [name] MD FU:[id number]   Expectorated Sputum Assessment w Gram Stain, Rflx to Resp Cult     Status: None   Collection Time: 03/27/20  8:23 PM   Specimen: Sputum  Result Value Ref Range Status   Specimen Description SPUTUM  Final   Special Requests NONE  Final   Sputum evaluation   Final    Sputum specimen not acceptable for testing.  Please recollect.   RESULT CALLED TO, READ BACK BY AND VERIFIED WITH: Radene Knee RN 03/27/20 2225 JDW Performed at [facility], [place] [address], [place], [place] [address]    Report Status 03/27/2020 FINAL  Final  Expectorated Sputum Assessment w Gram Stain, Rflx to Resp Cult     Status: None   Collection Time: 03/28/20  1:45 AM   Specimen: Sputum  Result Value Ref Range Status   Specimen Description SPUTUM  Final   Special Requests NONE  Final   Sputum evaluation   Final    Sputum specimen not acceptable for testing.  Please recollect.   RESULT CALLED TO, READ BACK BY AND VERIFIED WITH: P ASIDI RN 03/27/20 0354  JDW Performed at [facility], [place] [address], [place],  [address]    Report Status 03/28/2020 FINAL  Final  Acid Fast Smear (AFB)     Status: None   Collection Time: 03/28/20 12:00 PM   Specimen: Sputum  Result Value Ref Range Status   AFB Specimen Processing Concentration  Final   Acid Fast Smear Negative  Final    Comment: (NOTE) Performed At: [facility] [place], [place] [id number] [name] MD DJ:[id number]    Source (AFB) SPU  Final    Comment: Performed at [facility], [place] [address], [place], [place] [address]  Fungus Culture With Stain     Status: Abnormal (Preliminary result)   Collection Time: 03/28/20 12:00 PM   Specimen: Sputum  Result Value Ref Range Status   Fungus Stain Final report (A)  Final    Comment: (NOTE) Performed At: [facility] [address] [place], [place] [id number] [name] MD VE:[id number]    Fungus (Mycology) Culture PENDING  Incomplete   Fungal Source SPU  Final    Comment: Performed at [facility], [place] [address] [place], [place] [address]  Fungus Culture Result     Status: Abnormal   Collection Time: 03/28/20 12:00 PM  Result Value Ref Range Status   Result 1 Comment (A)  Final    Comment: (NOTE) Fungal elements, such as arthroconidia, hyphal fragments, chlamydoconidia, observed. Performed At: [facility] [place], [place] [id number] [name] MD [id number]   Acid Fast Smear (AFB)     Status: None   Collection Time: 03/29/20  1:56 PM   Specimen: Sputum  Result Value Ref Range Status   AFB Specimen Processing Concentration  Final   Acid Fast Smear Negative  Final    Comment: (NOTE) Performed At: [facility] [place], [place] [id number] [name] MD [id number]    Source (AFB) SPUTUM  Final    Comment: Performed at [facility], [place] [address], [place], [place] [address]  Fungus Culture With  Stain     Status: None (Preliminary result)   Collection Time: 03/29/20  1:56 PM  Result Value Ref Range Status   Fungus Stain Final report  Final    Comment: (NOTE) Performed At: [facility] [place], [place] [id number] [name] MD [id number]    Fungus (Mycology) Culture PENDING  Incomplete   Fungal Source SPUTUM  Final    Comment: Performed at [facility], [place] [address], [place], [place] [address]  Fungus Culture Result     Status: None   Collection Time: 03/29/20  1:56 PM  Result Value Ref Range Status   Result 1 Comment  Final    Comment: (NOTE) KOH/Calcofluor preparation:  no fungus observed. Performed At: [facility] [address] [place], [place] [id number] [name] MD LH:[id number]   Fungus Culture With Stain     Status: None (Preliminary result)   Collection Time: 03/29/20  4:24 PM  Result Value Ref Range Status   Fungus Stain Final report  Final    Comment: (NOTE) Performed At: [facility] [address] [place], [place] [id number] [name] MD [id number]    Fungus (Mycology) Culture PENDING  Incomplete   Fungal Source CSF  Final    Comment: Performed at [facility], [place] [address], [place], [place] [address]  CSF culture w Gram Stain     Status: Abnormal   Collection Time: 03/29/20  4:24 PM   Specimen: CSF; Cerebrospinal Fluid  Result Value Ref Range Status  Specimen Description CSF  Final   Special Requests SAMPLE IN FRIDGE PRIOR TO ADDING ON CSF CULTURE  Final   Gram Stain   Final    WBC PRESENT,BOTH PMN AND MONONUCLEAR CYTOSPIN SMEAR CORRECTED RESULTS NO ORGANISMS SEEN PREVIOUSLY REPORTED AS: CRYPTOCOCCUS NEOFORMANS CORRECTED RESULTS CALLED TO: RN SONYA B 90 [id number] FCP    Culture (A)  Final    CRYPTOCOCCUS NEOFORMANS CRITICAL VALUE NOTED.  VALUE IS CONSISTENT WITH PREVIOUSLY REPORTED AND CALLED VALUE. Performed at [facility], [place] [address] [place], [place]  [address]    Report Status 04/04/2020 FINAL  Final  Fungus Culture Result     Status: None   Collection Time: 03/29/20  4:24 PM  Result Value Ref Range Status   Result 1 Comment  Final    Comment: (NOTE) KOH/Calcofluor preparation:  no fungus observed. Performed At: [facility] [place], [place] [id number] [name] MD JK:[id number]   Expectorated Sputum Assessment w Gram Stain, Rflx to Resp Cult     Status: None   Collection Time: 03/30/20 10:50 AM   Specimen: Expectorated Sputum  Result Value Ref Range Status   Specimen Description EXPECTORATED SPUTUM  Final   Special Requests NONE  Final   Sputum evaluation   Final    Sputum specimen not acceptable for testing.  Please recollect.   RESULT CALLED TO, READ BACK BY AND VERIFIED WITH: ORODOSA RN 03/31/20 0539 JDW Performed at [place] [address] [place], [place] [address]    Report Status 03/31/2020 FINAL  Final  Expectorated Sputum Assessment w Gram Stain, Rflx to Resp Cult     Status: None   Collection Time: 03/31/20  9:12 AM   Specimen: Expectorated Sputum  Result Value Ref Range Status   Specimen Description EXPECTORATED SPUTUM  Final   Special Requests Immunocompromised  Final   Sputum evaluation   Final    Sputum specimen not acceptable for testing.  Please recollect.   Gram Stain Report Called to,Read Back By and Verified With: RN A LEWIS AT 1717 03/31/2020 BY L BENFIELD Performed at [facility], [place] [address], [place], [place] [address]    Report Status 03/31/2020 FINAL  Final  CSF culture w Gram Stain     Status: None   Collection Time: 04/02/20  3:00 PM   Specimen: CSF  Result Value Ref Range Status   Specimen Description CSF  Final   Special Requests NONE  Final   Gram Stain   Final    WBC PRESENT, PREDOMINANTLY MONONUCLEAR NO ORGANISMS SEEN CYTOSPIN SMEAR    Culture   Final    NO GROWTH 3 DAYS Performed at [facility], [place] [address], [place],  [place] [address]    Report Status 04/05/2020 FINAL  Final    RADIOLOGY STUDIES/RESULTS: No results found.   LOS: 10 days   Oren Binet, MD  [facility]    To contact the attending provider between 7A-7P or the covering provider during after hours 7P-7A, please log into the web site [email or web address] and access using universal [place] password for that web site. If you do not have the password, please call the hospital operator.  04/05/2020, 10:53 AM

## 2020-04-06 DIAGNOSIS — N179 Acute kidney failure, unspecified: Secondary | ICD-10-CM

## 2020-04-06 DIAGNOSIS — B20 Human immunodeficiency virus [HIV] disease: Secondary | ICD-10-CM | POA: Diagnosis not present

## 2020-04-06 DIAGNOSIS — B451 Cerebral cryptococcosis: Secondary | ICD-10-CM | POA: Diagnosis not present

## 2020-04-06 DIAGNOSIS — R519 Headache, unspecified: Secondary | ICD-10-CM | POA: Diagnosis not present

## 2020-04-06 DIAGNOSIS — I4892 Unspecified atrial flutter: Secondary | ICD-10-CM | POA: Diagnosis not present

## 2020-04-06 LAB — CBC
HCT: 35.9 % — ABNORMAL LOW (ref 39.0–52.0)
Hemoglobin: 12.2 g/dL — ABNORMAL LOW (ref 13.0–17.0)
MCH: 32.8 pg (ref 26.0–34.0)
MCHC: 34 g/dL (ref 30.0–36.0)
MCV: 96.5 fL (ref 80.0–100.0)
Platelets: 374 10*3/uL (ref 150–400)
RBC: 3.72 MIL/uL — ABNORMAL LOW (ref 4.22–5.81)
RDW: 11.4 % — ABNORMAL LOW (ref 11.5–15.5)
WBC: 4 10*3/uL (ref 4.0–10.5)
nRBC: 0 % (ref 0.0–0.2)

## 2020-04-06 LAB — BASIC METABOLIC PANEL
Anion gap: 8 (ref 5–15)
BUN: 14 mg/dL (ref 8–23)
CO2: 18 mmol/L — ABNORMAL LOW (ref 22–32)
Calcium: 9 mg/dL (ref 8.9–10.3)
Chloride: 107 mmol/L (ref 98–111)
Creatinine, Ser: 1.11 mg/dL (ref 0.61–1.24)
GFR, Estimated: >60 mL/min (ref >60)
Glucose, Bld: 107 mg/dL — ABNORMAL HIGH (ref 70–99)
Potassium: 4.3 mmol/L (ref 3.5–5.1)
Sodium: 133 mmol/L — ABNORMAL LOW (ref 135–145)

## 2020-04-06 LAB — BLASTOMYCES ANTIGEN: Blastomyces Antigen: NOT DETECTED ng/mL

## 2020-04-06 LAB — MAGNESIUM: Magnesium: 1.8 mg/dL (ref 1.7–2.4)

## 2020-04-06 MED ORDER — MAGNESIUM SULFATE 2 GM/50ML IV SOLN
2.0000 g | Freq: Once | INTRAVENOUS | Status: AC
  Administered 2020-04-07: 2 g via INTRAVENOUS
  Filled 2020-04-06: qty 50

## 2020-04-06 MED ORDER — MAGNESIUM SULFATE 2 GM/50ML IV SOLN
2.0000 g | Freq: Once | INTRAVENOUS | Status: AC
  Administered 2020-04-06: 2 g via INTRAVENOUS
  Filled 2020-04-06: qty 50

## 2020-04-06 NOTE — Progress Notes (Signed)
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 Patient started on amphotericin B and flucytosine 2/21.   CD4 count <35; VL 25,300 (2/23)   Scr stable at 1.11 today will continue to monitor closely K 4.2 after 40 mEQ yesterday Mg 1.8 after 2 g replacement yesterday - 2g Mg replacement today  Antimicrobials: - amphotericin B 2/21-c  - flucytosine 2/21-c - Azithromycin 1,200 mg weekly for MAC prophylaxis   Plan: - continue amphotericin B and flucytosine for 2 weeks of induction (stop date 04/09/20); adjustments per ID team  - Continue  pre-fluid NS  750 mL and post fluid to 750 mL  - Mg 2 g x1 - monitor Scr daily - Monitor CBC Q72H for signs of neutropenia    Nicoletta Dress, PharmD, BCIDP Infectious Disease Pharmacist  Phone: [phone] 04/06/2020 7:48 AM

## 2020-04-06 NOTE — Progress Notes (Signed)
PROGRESS NOTE        PATIENT DETAILS Name: Ricky [name] Age: 65 y.o. Sex: male Date of Birth: November 29, 1955 Admit Date: 03/26/2020 Admitting Physician Reubin Milan, MD PCP:Pcp, No  Brief Narrative: Patient is a 65 y.o. 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 2/22>> CT chest with contrast: Nodular/cavitary airspace consolidation in the left upper/lower lobes 2/22>> Echo: EF 50-55% 2/24>> CT head: No acute intracranial process 2/24>> nuclear stress test: No ischemia  Antimicrobial therapy: Amphotericin/flucytosine: 2/21>>  Microbiology data: 2/21>> CSF culture: Cryptococcus neoformans 2/23>> sputum AFB smear negative 2/24>> sputum AFB smear: Negative 2/24>> CSF culture: Cryptococcus neoformans 2/28>> CSF culture: No growth  Procedures : LP>> 2/22, 2/24, 2/28  Consults: Neurology, cardiology Terri Skains), ID  DVT Prophylaxis : SCDs Start: 03/26/20 1946   Subjective: Lying comfortably in bed-no major issues.  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  ID recommending repeat LP on Monday.  Left lung cavitary lesion: AFB x2 - ID following-awaiting further work-up-May require bronchoscopy at some point.  HIV/AIDS: Noncompliant-timing for initiation of ART defer to ID.  SVT: Continue telemetry monitoring-on Cardizem.  Nuclear stress test negative-echo with preserved EF.  TSH within normal limits.  Recent COVID-19 infection: Asymptomatic-out of quarantine window.   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 Yes; Fluid consistency: Thin  Diet effective now                  Code Status: Full code  Family Communication: Will reach out to family over the next few days.  Disposition Plan: Status is: Inpatient  Remains inpatient appropriate because:Inpatient level of care appropriate due to severity of illness   Dispo:  Patient From: Home  Planned Disposition: Home  Medically stable for discharge: No     Barriers to Discharge: Cryptococcal meningitis-induction therapy with IV amphotericin B.   Antimicrobial agents: Anti-infectives (From admission, onward)   Start     Dose/Rate Route Frequency Ordered Stop   03/28/20 1000  azithromycin (ZITHROMAX) tablet 1,200 mg        1,200 mg Oral Weekly 03/28/20 0912     03/28/20 1000  sulfamethoxazole-trimethoprim (BACTRIM) 400-80 MG per tablet 1 tablet  Status:  Discontinued        1 tablet Oral Daily 03/28/20 0912 04/02/20 0919   03/27/20 2115  amphotericin B liposome (AMBISOME) 300 mg in dextrose 5 % 500 mL IVPB  Status:  Discontinued        300 mg 287.5 mL/hr over 120 Minutes Intravenous Every 24 hours 03/26/20 2024 03/26/20 2227   03/26/20 2227  amphotericin B liposome (AMBISOME) 300 mg in dextrose 5 % 500 mL IVPB        300 mg 287.5 mL/hr over 120 Minutes Intravenous Every 24 hours 03/26/20 2227     03/26/20  1945  amphotericin B liposome (AMBISOME) 300 mg in dextrose 5 % 500 mL IVPB  Status:  Discontinued        300 mg 287.5 mL/hr over 120 Minutes Intravenous Every 24 hours 03/26/20 1850 03/26/20 2024   03/26/20 1900  flucytosine (ANCOBON) capsule 2,000 mg        25 mg/kg  80 kg Oral Every 6 hours 03/26/20 1850         Time spent: 15- minutes-Greater  than 50% of this time was spent in counseling, explanation of diagnosis, planning of further management, and coordination of care.  MEDICATIONS: Scheduled Meds: . azithromycin  1,200 mg Oral Weekly  . dextromethorphan  30 mg Oral BID  . dextrose  10 mL Intravenous Q24H  . dextrose  10 mL Intravenous Q24H  . diltiazem  30 mg Oral Q8H  . flucytosine  25 mg/kg Oral Q6H  . sodium chloride  750 mL Intravenous Q24H  . sodium chloride  750 mL Intravenous Q24H   Continuous Infusions: . amphotericin  B  Liposome (AMBISOME) ADULT IV Stopped (04/05/20 2319)   PRN Meds:.acetaminophen **OR** acetaminophen, acetaminophen, diphenhydrAMINE **OR** diphenhydrAMINE, ibuprofen, ondansetron **OR** ondansetron (ZOFRAN) IV, oxyCODONE   PHYSICAL EXAM: Vital signs: Vitals:   04/05/20 2003 04/05/20 2358 04/06/20 0420 04/06/20 0810  BP: 132/82 136/88 128/84 136/77  Pulse: 69 61 (!) 54 64  Resp:  18 18 16   Temp: (!) 97.5 F (36.4 C) 98.3 F (36.8 C) 97.7 F (36.5 C) 98 F (36.7 C)  TempSrc: Oral Oral Oral Oral  SpO2: 100% 98% 99% 100%  Weight:      Height:       Filed Weights   03/26/20 1839 03/27/20 0141  Weight: 80 kg 80.8 kg   Body mass index is 23.5 kg/m.   Gen Exam:Alert awake-not in any distress HEENT:atraumatic, normocephalic Chest: B/L clear to auscultation anteriorly CVS:S1S2 regular Abdomen:soft non tender, non distended Extremities:no edema Neurology: Non focal Skin: no rash  I have personally reviewed following labs and imaging studies  LABORATORY DATA: CBC: Recent Labs  Lab 03/31/20 0259 04/03/20 0442 04/04/20 0304 04/05/20 0407 04/06/20 0457  WBC 4.6 3.6* 4.8 4.8 4.0  HGB 12.6* 13.0 13.1 12.0* 12.2*  HCT 35.3* 36.2* 38.2* 34.3* 35.9*  MCV 94.9 95.3 96.7 96.9 96.5  PLT 329 384 379 367 761    Basic Metabolic Panel: Recent Labs  Lab 04/02/20 0208 04/03/20 0442 04/04/20 0304 04/04/20 0613 04/05/20 0407 04/06/20 0457  NA 134* 133* 131*  --  132* 133*  K  4.0 4.7 5.5* 3.9 4.2 4.3  CL 104 103 103  --  104 107  CO2 22 22 19*  --  20* 18*  GLUCOSE 123* 109* 128*  --  168* 107*  BUN 15 15 19   --  15 14  CREATININE 1.13 1.27* 1.21  --  1.23 1.11  CALCIUM 8.7* 9.1 8.8*  --  8.8* 9.0  MG 1.8 2.0 2.0  --  1.6* 1.8    GFR: Estimated Creatinine Clearance: 78 mL/min (by C-G formula based on SCr of 1.11 mg/dL).  Liver Function Tests: Recent Labs  Lab 03/31/20 0259  AST 24  ALT 17  ALKPHOS 100  BILITOT 0.8  PROT 7.2  ALBUMIN 2.8*   No results for input(s): LIPASE, AMYLASE in the last 168 hours. No results for input(s): AMMONIA in the last 168 hours.  Coagulation Profile: No results for input(s): INR, PROTIME in the last 168 hours.  Cardiac Enzymes: No results  for input(s): CKTOTAL, CKMB, CKMBINDEX, TROPONINI in the last 168 hours.  BNP (last 3 results) No results for input(s): PROBNP in the last 8760 hours.  Lipid Profile: No results for input(s): CHOL, HDL, LDLCALC, TRIG, CHOLHDL, LDLDIRECT in the last 72 hours.  Thyroid Function Tests: No results for input(s): TSH, T4TOTAL, FREET4, T3FREE, THYROIDAB in the last 72 hours.  Anemia Panel: No results for input(s): VITAMINB12, FOLATE, FERRITIN, TIBC, IRON, RETICCTPCT in the last 72 hours.  Urine analysis: No results found for: COLORURINE, APPEARANCEUR, LABSPEC, PHURINE, GLUCOSEU, HGBUR, BILIRUBINUR, KETONESUR, PROTEINUR, UROBILINOGEN, NITRITE, LEUKOCYTESUR  Sepsis Labs: Lactic Acid, Venous    Component Value Date/Time   LATICACIDVEN 0.9 03/26/2020 0958    MICROBIOLOGY: Recent Results (from the past 240 hour(s))  Expectorated Sputum Assessment w Gram Stain, Rflx to Resp Cult     Status: None   Collection Time: 03/27/20  8:23 PM   Specimen: Sputum  Result Value Ref Range Status   Specimen Description SPUTUM  Final   Special Requests NONE  Final   Sputum evaluation   Final    Sputum specimen not acceptable for testing.  Please recollect.   RESULT CALLED TO, READ BACK BY  AND VERIFIED WITH: Radene Knee RN 03/27/20 2225 JDW Performed at [facility], [place] [address] [place], [place] [address]    Report Status 03/27/2020 FINAL  Final  Expectorated Sputum Assessment w Gram Stain, Rflx to Resp Cult     Status: None   Collection Time: 03/28/20  1:45 AM   Specimen: Sputum  Result Value Ref Range Status   Specimen Description SPUTUM  Final   Special Requests NONE  Final   Sputum evaluation   Final    Sputum specimen not acceptable for testing.  Please recollect.   RESULT CALLED TO, READ BACK BY AND VERIFIED WITH: P ASIDI RN 03/27/20 0354 JDW Performed at [facility], [place] [address] [place], [place] [address]    Report Status 03/28/2020 FINAL  Final  Acid Fast Smear (AFB)     Status: None   Collection Time: 03/28/20 12:00 PM   Specimen: Sputum  Result Value Ref Range Status   AFB Specimen Processing Concentration  Final   Acid Fast Smear Negative  Final    Comment: (NOTE) Performed At: [facility] [address] [place], [place] [id number] [name] MD [id number]    Source (AFB) SPU  Final    Comment: Performed at [facility], [place] [address], [place], [place] [address]  Fungus Culture With Stain     Status: Abnormal (Preliminary result)   Collection Time: 03/28/20 12:00 PM   Specimen: Sputum  Result Value Ref Range Status   Fungus Stain Final report (A)  Final    Comment: (NOTE) Performed At: [facility] [address] [place], [place] [id number] [name] MD [id number]    Fungus (Mycology) Culture PENDING  Incomplete   Fungal Source SPU  Final    Comment: Performed at [facility], [place] [address] [place], [place] [address]  Fungus Culture Result     Status: Abnormal   Collection Time: 03/28/20 12:00 PM  Result Value Ref Range Status   Result 1 Comment (A)  Final    Comment: (NOTE) Fungal elements, such as arthroconidia, hyphal fragments, chlamydoconidia, observed. Performed At: [facility] [place], [place] [id number] [name] MD [id number]   Acid Fast Smear (AFB)     Status: None   Collection  Time: 03/29/20  1:56 PM   Specimen: Sputum  Result Value Ref Range Status   AFB Specimen Processing Concentration  Final   Acid Fast Smear Negative  Final    Comment: (NOTE) Performed At: [facility] [place], [place] [id number] [name] MD [id number]    Source (AFB) SPUTUM  Final    Comment: Performed at [facility], [place] [address], [place], [place] [address]  Fungus Culture With Stain     Status: None (Preliminary result)   Collection Time: 03/29/20  1:56 PM  Result Value Ref Range Status   Fungus Stain Final report  Final    Comment: (NOTE) Performed At: [facility] [place], [place] [id number] [name] MD [id number]    Fungus (Mycology) Culture PENDING  Incomplete   Fungal Source SPUTUM  Final    Comment: Performed at [facility], [place] [address], [place], [place] [address]  Fungus Culture Result     Status: None   Collection Time: 03/29/20  1:56 PM  Result Value Ref Range Status   Result 1 Comment  Final    Comment: (NOTE) KOH/Calcofluor preparation:  no fungus observed. Performed At: [facility] [address] [place], [place] [id number] [name] MD [id number]   Fungus Culture With Stain     Status: None (Preliminary result)   Collection Time: 03/29/20  4:24 PM  Result Value Ref Range Status   Fungus Stain Final report  Final    Comment: (NOTE) Performed At: [facility] [address] [place], [place] [id number] [name] MD [id number]    Fungus (Mycology) Culture PENDING  Incomplete   Fungal Source CSF  Final    Comment: Performed at [facility], [place] [address] [place], [place] [address]  CSF culture w Gram Stain     Status: Abnormal   Collection Time: 03/29/20  4:24 PM   Specimen: CSF; Cerebrospinal  Fluid  Result Value Ref Range Status   Specimen Description CSF  Final   Special Requests SAMPLE IN FRIDGE PRIOR TO ADDING ON CSF CULTURE  Final   Gram Stain   Final    WBC PRESENT,BOTH PMN AND MONONUCLEAR CYTOSPIN SMEAR CORRECTED RESULTS NO ORGANISMS SEEN PREVIOUSLY REPORTED AS: CRYPTOCOCCUS NEOFORMANS CORRECTED RESULTS CALLED TO: RN SONYA B [id number] FCP    Culture (A)  Final    CRYPTOCOCCUS NEOFORMANS CRITICAL VALUE NOTED.  VALUE IS CONSISTENT WITH PREVIOUSLY REPORTED AND CALLED VALUE. Performed at [facility], [place] [address], [place], [place] [address]    Report Status 04/04/2020 FINAL  Final  Fungus Culture Result     Status: None   Collection Time: 03/29/20  4:24 PM  Result Value Ref Range Status   Result 1 Comment  Final    Comment: (NOTE) KOH/Calcofluor preparation:  no fungus observed. Performed At: [facility] [place], [place] [id number] [name] MD [id number]   Expectorated Sputum Assessment w Gram Stain, Rflx to Resp Cult     Status: None   Collection Time: 03/30/20 10:50 AM   Specimen: Expectorated Sputum  Result Value Ref Range Status   Specimen Description EXPECTORATED SPUTUM  Final   Special Requests NONE  Final   Sputum evaluation   Final    Sputum specimen not acceptable for testing.  Please recollect.   RESULT CALLED TO, READ BACK BY AND VERIFIED WITH: ORODOSA RN 03/31/20 0539 JDW Performed at [place]  [address] [place], [place] [address]    Report Status 03/31/2020 FINAL  Final  Expectorated Sputum Assessment w Gram Stain, Rflx to Resp Cult     Status: None   Collection Time: 03/31/20  9:12 AM   Specimen: Expectorated Sputum  Result Value Ref Range Status   Specimen Description EXPECTORATED SPUTUM  Final   Special Requests Immunocompromised  Final   Sputum evaluation   Final    Sputum specimen not acceptable for testing.  Please recollect.   Gram Stain Report Called to,Read Back By and  Verified With: RN A LEWIS AT 1717 03/31/2020 BY L BENFIELD Performed at [facility], [place] [address], [place], [place] [address]    Report Status 03/31/2020 FINAL  Final  CSF culture w Gram Stain     Status: None   Collection Time: 04/02/20  3:00 PM   Specimen: CSF  Result Value Ref Range Status   Specimen Description CSF  Final   Special Requests NONE  Final   Gram Stain   Final    WBC PRESENT, PREDOMINANTLY MONONUCLEAR NO ORGANISMS SEEN CYTOSPIN SMEAR    Culture   Final    NO GROWTH 3 DAYS Performed at [facility], [place] [address] [place], [place] [address]    Report Status 04/05/2020 FINAL  Final    RADIOLOGY STUDIES/RESULTS: No results found.   LOS: 11 days   Oren Binet, MD  [facility]    To contact the attending provider between 7A-7P or the covering provider during after hours 7P-7A, please log into the web site [email or web address] and access using universal [place] password for that web site. If you do not have the password, please call the hospital operator.  04/06/2020, 11:25 AM

## 2020-04-06 NOTE — Progress Notes (Addendum)
Subjective: No new complaints   Antibiotics:  Anti-infectives (From admission, onward)   Start     Dose/Rate Route Frequency Ordered Stop   03/28/20 1000  azithromycin (ZITHROMAX) tablet 1,200 mg        1,200 mg Oral Weekly 03/28/20 0912     03/28/20 1000  sulfamethoxazole-trimethoprim (BACTRIM) 400-80 MG per tablet 1 tablet  Status:  Discontinued        1 tablet Oral Daily 03/28/20 0912 04/02/20 0919   03/27/20 2115  amphotericin B liposome (AMBISOME) 300 mg in dextrose 5 % 500 mL IVPB  Status:  Discontinued        300 mg 287.5 mL/hr over 120 Minutes Intravenous Every 24 hours 03/26/20 2024 03/26/20 2227   03/26/20 2227  amphotericin B liposome (AMBISOME) 300 mg in dextrose 5 % 500 mL IVPB        300 mg 287.5 mL/hr over 120 Minutes Intravenous Every 24 hours 03/26/20 2227     03/26/20 1945  amphotericin B liposome (AMBISOME) 300 mg in dextrose 5 % 500 mL IVPB  Status:  Discontinued        300 mg 287.5 mL/hr over 120 Minutes Intravenous Every 24 hours 03/26/20 1850 03/26/20 2024   03/26/20 1900  flucytosine (ANCOBON) capsule 2,000 mg        25 mg/kg  80 kg Oral Every 6 hours 03/26/20 1850        Medications: Scheduled Meds: . azithromycin  1,200 mg Oral Weekly  . dextromethorphan  30 mg Oral BID  . dextrose  10 mL Intravenous Q24H  . dextrose  10 mL Intravenous Q24H  . diltiazem  30 mg Oral Q8H  . flucytosine  25 mg/kg Oral Q6H  . sodium chloride  750 mL Intravenous Q24H  . sodium chloride  750 mL Intravenous Q24H   Continuous Infusions: . amphotericin  B  Liposome (AMBISOME) ADULT IV Stopped (04/05/20 2319)   PRN Meds:.acetaminophen **OR** acetaminophen, acetaminophen, diphenhydrAMINE **OR** diphenhydrAMINE, ibuprofen, ondansetron **OR** ondansetron (ZOFRAN) IV, oxyCODONE    Objective: Weight change:   Intake/Output Summary (Last 24 hours) at 04/06/2020 1538 Last data filed at 04/05/2020 2327 Gross per 24 hour  Intake 4667.63 ml  Output 200 ml  Net  4467.63 ml   Blood pressure 133/85, pulse 63, temperature 98.5 F (36.9 C), temperature source Oral, resp. rate 18, height 6\' 1"  (1.854 m), weight 80.8 kg, SpO2 100 %. Temp:  [97.5 F (36.4 C)-98.6 F (37 C)] 98.5 F (36.9 C) (03/04 1236) Pulse Rate:  [54-80] 63 (03/04 1236) Resp:  [16-20] 18 (03/04 1236) BP: (128-136)/(77-88) 133/85 (03/04 1236) SpO2:  [98 %-100 %] 100 % (03/04 1236)  Physical Exam: Physical Exam Constitutional:      Appearance: He is well-developed and well-nourished.  HENT:     Head: Normocephalic and atraumatic.  Eyes:     Extraocular Movements: EOM normal.     Conjunctiva/sclera: Conjunctivae normal.  Cardiovascular:     Rate and Rhythm: Normal rate and regular rhythm.  Pulmonary:     Effort: Pulmonary effort is normal. No respiratory distress.     Breath sounds: No wheezing.  Abdominal:     General: There is no distension.     Palpations: Abdomen is soft.  Musculoskeletal:        General: No edema. Normal range of motion.     Cervical back: Normal range of motion and neck supple.  Skin:    General: Skin is warm and dry.  Findings: No erythema or rash.  Neurological:     Mental Status: He is alert and oriented to person, place, and time.  Psychiatric:        Mood and Affect: Mood and affect and mood normal. Mood is not anxious or depressed.        Speech: Speech normal.        Behavior: Behavior normal. Behavior is not agitated.        Thought Content: Thought content normal.        Cognition and Memory: Cognition is not impaired. Memory is not impaired.        Judgment: Judgment normal.      CBC:    BMET Recent Labs    04/05/20 0407 04/06/20 0457  NA 132* 133*  K 4.2 4.3  CL 104 107  CO2 20* 18*  GLUCOSE 168* 107*  BUN 15 14  CREATININE 1.23 1.11  CALCIUM 8.8* 9.0     Liver Panel  No results for input(s): PROT, ALBUMIN, AST, ALT, ALKPHOS, BILITOT, BILIDIR, IBILI in the last 72 hours.     Sedimentation Rate No  results for input(s): ESRSEDRATE in the last 72 hours. C-Reactive Protein No results for input(s): CRP in the last 72 hours.  Micro Results: Recent Results (from the past 720 hour(s))  CSF culture     Status: Abnormal   Collection Time: 03/26/20  5:05 PM   Specimen: CSF; Cerebrospinal Fluid  Result Value Ref Range Status   Specimen Description CSF  Final   Special Requests NONE  Final   Gram Stain   Final    WBC PRESENT,BOTH PMN AND MONONUCLEAR CRYPTOCOCCUS NEOFORMANS CYTOSPIN SMEAR Performed at [facility], [address] [place], [place] [address]    Culture CRYPTOCOCCUS NEOFORMANS (A)  Final   Report Status 03/29/2020 FINAL  Final  Fungus Culture With Stain     Status: Abnormal   Collection Time: 03/26/20  5:05 PM   Specimen: Lumbar Puncture; Cerebrospinal Fluid  Result Value Ref Range Status   Fungus Stain Final report  Final   Fungus (Mycology) Culture Preliminary report (A)  Final    Comment: (NOTE) Called prelim result of yeast isolated to Camelia on 04-06-20 at 0950. Faxed to [phone]. LL Performed At: [facility] [place], [place] [id number] [name] MD VP:[id number]    Fungal Source CSF  Final    Comment: Performed at [facility], [place] [address] [place], [place] [address]  Fungus Culture Result     Status: None   Collection Time: 03/26/20  5:05 PM  Result Value Ref Range Status   Result 1 Comment  Final    Comment: (NOTE) KOH/Calcofluor preparation:  no fungus observed. Performed At: [facility] [place], [place] [id number] [name] MD EX:[id number]   Fungal organism reflex     Status: Abnormal   Collection Time: 03/26/20  5:05 PM  Result Value Ref Range Status   Fungal result 1 Comment (A)  Final    Comment: (NOTE) Yeast isolated, identification in progress. Performed At: [facility] [place], [place] [id number] [name] MD [id number]    Expectorated Sputum Assessment w Gram Stain, Rflx to Resp Cult     Status: None   Collection Time: 03/27/20  8:23 PM   Specimen: Sputum  Result Value Ref Range Status   Specimen Description SPUTUM  Final   Special Requests NONE  Final   Sputum evaluation  Final    Sputum specimen not acceptable for testing.  Please recollect.   RESULT CALLED TO, READ BACK BY AND VERIFIED WITH: Radene Knee RN 03/27/20 2225 JDW Performed at [facility], [place] [address] [place], [place] [address]    Report Status 03/27/2020 FINAL  Final  Expectorated Sputum Assessment w Gram Stain, Rflx to Resp Cult     Status: None   Collection Time: 03/28/20  1:45 AM   Specimen: Sputum  Result Value Ref Range Status   Specimen Description SPUTUM  Final   Special Requests NONE  Final   Sputum evaluation   Final    Sputum specimen not acceptable for testing.  Please recollect.   RESULT CALLED TO, READ BACK BY AND VERIFIED WITH: P ASIDI RN 03/27/20 0354 JDW Performed at [facility], [place] [address], [place], [place] [address]    Report Status 03/28/2020 FINAL  Final  Acid Fast Smear (AFB)     Status: None   Collection Time: 03/28/20 12:00 PM   Specimen: Sputum  Result Value Ref Range Status   AFB Specimen Processing Concentration  Final   Acid Fast Smear Negative  Final    Comment: (NOTE) Performed At: [facility] [address] [place], [place] [id number] [name] MD [id number]    Source (AFB) SPU  Final    Comment: Performed at [facility], [place] [address], [place], [place] [address]  Fungus Culture With Stain     Status: Abnormal (Preliminary result)   Collection Time: 03/28/20 12:00 PM   Specimen: Sputum  Result Value Ref Range Status   Fungus Stain Final report (A)  Final    Comment: (NOTE) Performed At: [facility] [address] [place], [place] [id number] [name] MD [id number]    Fungus (Mycology) Culture PENDING  Incomplete   Fungal Source SPU   Final    Comment: Performed at [facility], [place] [address], [place], [place] [address]  Fungus Culture Result     Status: Abnormal   Collection Time: 03/28/20 12:00 PM  Result Value Ref Range Status   Result 1 Comment (A)  Final    Comment: (NOTE) Fungal elements, such as arthroconidia, hyphal fragments, chlamydoconidia, observed. Performed At: [facility] [place], [place] [id number] [name] MD OE:[id number]   Acid Fast Smear (AFB)     Status: None   Collection Time: 03/29/20  1:56 PM   Specimen: Sputum  Result Value Ref Range Status   AFB Specimen Processing Concentration  Final   Acid Fast Smear Negative  Final    Comment: (NOTE) Performed At: [facility] [place], [place] [id number] [name] MD CV:[id number]    Source (AFB) SPUTUM  Final    Comment: Performed at [facility], [place] [address] [place], [place] [address]  Fungus Culture With Stain     Status: None (Preliminary result)   Collection Time: 03/29/20  1:56 PM  Result Value Ref Range Status   Fungus Stain Final report  Final    Comment: (NOTE) Performed At: [facility] [place], [place] [id number] [name] MD [id number]    Fungus (Mycology) Culture PENDING  Incomplete   Fungal Source SPUTUM  Final    Comment: Performed at [facility], [place] [address], [place], [place] [address]  Fungus Culture Result     Status: None   Collection Time: 03/29/20  1:56 PM  Result  Value Ref Range Status   Result 1 Comment  Final    Comment: (NOTE) KOH/Calcofluor preparation:  no fungus observed. Performed At: [facility] [address] [place], [place] [id number] [name] MD VH:[id number]   Fungus Culture With Stain     Status: None (Preliminary result)   Collection Time: 03/29/20  4:24 PM  Result Value Ref Range Status   Fungus Stain Final report  Final    Comment: (NOTE) Performed At: [facility] [address] [place], [place] [id number] [name] MD [id number]    Fungus (Mycology) Culture PENDING  Incomplete   Fungal Source CSF  Final    Comment: Performed at [facility], [place] [address], [place], [place] [address]  CSF culture w Gram Stain     Status: Abnormal   Collection Time: 03/29/20  4:24 PM   Specimen: CSF; Cerebrospinal Fluid  Result Value Ref Range Status   Specimen Description CSF  Final   Special Requests SAMPLE IN FRIDGE PRIOR TO ADDING ON CSF CULTURE  Final   Gram Stain   Final    WBC PRESENT,BOTH PMN AND MONONUCLEAR CYTOSPIN SMEAR CORRECTED RESULTS NO ORGANISMS SEEN PREVIOUSLY REPORTED AS: CRYPTOCOCCUS NEOFORMANS CORRECTED RESULTS CALLED TO: RN SONYA B [id number] FCP    Culture (A)  Final    CRYPTOCOCCUS NEOFORMANS CRITICAL VALUE NOTED.  VALUE IS CONSISTENT WITH PREVIOUSLY REPORTED AND CALLED VALUE. Performed at [facility], [place] [address], [place], [place] [address]    Report Status 04/04/2020 FINAL  Final  Fungus Culture Result     Status: None   Collection Time: 03/29/20  4:24 PM  Result Value Ref Range Status   Result 1 Comment  Final    Comment: (NOTE) KOH/Calcofluor preparation:  no fungus observed. Performed At: [facility] [place], [place] [id number] [name] MD YS:[id number]   Expectorated Sputum Assessment w Gram Stain, Rflx to Resp Cult     Status: None   Collection Time: 03/30/20 10:50 AM   Specimen: Expectorated Sputum  Result Value Ref Range Status   Specimen Description EXPECTORATED SPUTUM  Final   Special Requests NONE  Final   Sputum evaluation   Final    Sputum specimen not acceptable for testing.  Please recollect.   RESULT CALLED TO, READ BACK BY AND VERIFIED WITH: ORODOSA RN 03/31/20 0539 JDW Performed at [place] [address], [place], [place] [address]    Report Status 03/31/2020 FINAL  Final  Expectorated Sputum Assessment w Gram Stain, Rflx to Resp  Cult     Status: None   Collection Time: 03/31/20  9:12 AM   Specimen: Expectorated Sputum  Result Value Ref Range Status   Specimen Description EXPECTORATED SPUTUM  Final   Special Requests Immunocompromised  Final   Sputum evaluation   Final    Sputum specimen not acceptable for testing.  Please recollect.   Gram Stain Report Called to,Read Back By and Verified With: RN A LEWIS AT 1717 03/31/2020 BY L BENFIELD Performed at [facility], [place] [address], [place], [place] [address]    Report Status 03/31/2020 FINAL  Final  CSF culture w Gram Stain     Status: None   Collection Time: 04/02/20  3:00 PM   Specimen: CSF  Result Value Ref Range Status   Specimen Description CSF  Final   Special Requests NONE  Final   Gram Stain   Final    WBC PRESENT,  PREDOMINANTLY MONONUCLEAR NO ORGANISMS SEEN CYTOSPIN SMEAR    Culture   Final    NO GROWTH 3 DAYS Performed at [facility], [place] [address], [place], [place] [address]    Report Status 04/05/2020 FINAL  Final  Blastomyces Antigen     Status: None   Collection Time: 04/03/20  4:42 AM   Specimen: Blood  Result Value Ref Range Status   Blastomyces Antigen None Detected None Detected ng/mL Final    Comment: (NOTE) Results reported as ng/mL in 0.2 - 14.7 ng/mL range Results above the limit of detection but below 0.2 ng/mL are reported as 'Positive, Below the Limit of Quantification' Results above 14.7 ng/mL are reported as 'Positive, Above the Limit of Quantification'    Specimen Type SERUM  Final    Comment: (NOTE) Performed At: [facility] [place], [place] [id number] Bruce Donath MD BM:[id number]     Studies/Results: No results found.    Assessment/Plan:  INTERVAL HISTORY:  Dr Leonel Ramsay called and we discussed case   Principal Problem:   Cryptococcal meningitis ([place]) Active Problems:   Leukocytopenia   Macrocytosis   Moderate protein malnutrition ([place])    Hypoalbuminemia   AIDS (acquired immune deficiency syndrome) ([facility])   Atrial flutter ([facility])   Aortic atherosclerosis ([facility])   Hypertension   SVT (supraventricular tachycardia) (H[facility])   Abnormal EKG   Acute intractable headache   Smoking   Cocaine use   Cavitary lesion of lung    Ricky [name] is a 65 y.o. 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  He has a history of prior tuberculosis that was treated.  He has acute kidney injury the context of amphotericin.  1.  Cryptococcal meningitis:  He had high intracranial pressures on the first 2 lumbar punctures Dr. Leonel Ramsay pointed out that his pressure was normal at 14 cm of water 4 days after his second lumbar puncture.  The patient currently tells me he is continuing to feel better.  He initially had severe headaches and visual changes with his vision having a waving appearance to him that he described to me as being like when a TV is giving out.  He currently does not have a headache but says at the end of the day he sometimes develops a little bit of a headache particular behind his right eye.  I do not think we need to do a lumbar puncture today and we may not have to do one next week either if he continues to improve.  Please monitor him closely for any evidence of worsening headache or visual changes.  We will continue amphotericin and flucytosine.  I will send a G6PD level in case you want to use dapsone as an alternative for PCP prevention.  Want to defer his antiretroviral therapy to at least 5 weeks post treatment of his cryptococcal meningitis.  My partner Dr. Linus Salmons is on call this week and will ask him to check in on the patient on Sunday  I spent greater than 35 minutes with the patient including greater than 50% of time in face to face counsel of the patient and in coordination of his care.   LOS: 11 days    Alcide Evener 04/06/2020, 3:38 PM

## 2020-04-07 LAB — BASIC METABOLIC PANEL
Anion gap: 6 (ref 5–15)
BUN: 12 mg/dL (ref 8–23)
CO2: 23 mmol/L (ref 22–32)
Calcium: 9.2 mg/dL (ref 8.9–10.3)
Chloride: 104 mmol/L (ref 98–111)
Creatinine, Ser: 1.12 mg/dL (ref 0.61–1.24)
GFR, Estimated: >60 mL/min (ref >60)
Glucose, Bld: 126 mg/dL — ABNORMAL HIGH (ref 70–99)
Potassium: 4.3 mmol/L (ref 3.5–5.1)
Sodium: 133 mmol/L — ABNORMAL LOW (ref 135–145)

## 2020-04-07 LAB — CBC
HCT: 36.4 % — ABNORMAL LOW (ref 39.0–52.0)
Hemoglobin: 12.9 g/dL — ABNORMAL LOW (ref 13.0–17.0)
MCH: 33.9 pg (ref 26.0–34.0)
MCHC: 35.4 g/dL (ref 30.0–36.0)
MCV: 95.5 fL (ref 80.0–100.0)
Platelets: 380 10*3/uL (ref 150–400)
RBC: 3.81 MIL/uL — ABNORMAL LOW (ref 4.22–5.81)
RDW: 11.5 % (ref 11.5–15.5)
WBC: 4.4 10*3/uL (ref 4.0–10.5)
nRBC: 0 % (ref 0.0–0.2)

## 2020-04-07 LAB — MAGNESIUM: Magnesium: 1.8 mg/dL (ref 1.7–2.4)

## 2020-04-07 MED ORDER — PRAVASTATIN SODIUM 10 MG PO TABS
20.0000 mg | ORAL_TABLET | Freq: Every evening | ORAL | Status: DC
  Administered 2020-04-07 – 2020-04-24 (×16): 20 mg via ORAL
  Filled 2020-04-07 (×3): qty 1
  Filled 2020-04-07: qty 2
  Filled 2020-04-07 (×5): qty 1
  Filled 2020-04-07: qty 2
  Filled 2020-04-07 (×8): qty 1

## 2020-04-07 MED ORDER — DILTIAZEM HCL ER COATED BEADS 180 MG PO CP24
180.0000 mg | ORAL_CAPSULE | Freq: Every day | ORAL | Status: DC
  Administered 2020-04-07 – 2020-04-15 (×9): 180 mg via ORAL
  Filled 2020-04-07 (×9): qty 1

## 2020-04-07 NOTE — Progress Notes (Signed)
Telemetry called to inform RN pt HR brady down to 41 and rhythm is second degree heart block type 2. Pt was resting comfortably in bed asymptomatic. MD notified, new order received for EKG. Completed and placed in pt chart. Will continue to closely monitor. Delia Heady RN

## 2020-04-07 NOTE — Progress Notes (Signed)
Antibiotic Monitoring  HPI: Pt is a 66 YOM with PMH significant for HIV who came in 2/21 complaining of 2 week h/o of HA, and increasing sinus pressure, found to have cryptococcal meningitis. Patient started on amphotericin B and flucytosine 2/21.   CD4 count <35; VL 25,300 (2/23)   Scr stable at 1.12 today -will continue to monitor closely K 4.3  Mg 1.8 after 2 g replacement yesterday - 2g Mg replacement today  Antimicrobials: - amphotericin B 2/21-c  - flucytosine 2/21-c - Azithromycin 1,200 mg weekly for MAC prophylaxis   Plan: - continue amphotericin B and flucytosine for 2 weeks of induction (stop date 04/09/20); adjustments per ID team  - Continue pre-fluid NS  750 mL and post fluid NS 750 mL  - Mg 2 g x1 - monitor Scr daily - Monitor CBC Q72H for signs of neutropenia    Dimple Nanas, PharmD PGY-1 Acute Care Pharmacy Resident Office: [phone] 04/07/2020 8:11 AM

## 2020-04-07 NOTE — Progress Notes (Signed)
PROGRESS NOTE        PATIENT DETAILS Name: Ricky [name] Age: 65 y.o. Sex: male Date of Birth: December 07, 1955 Admit Date: 03/26/2020 Admitting Physician Reubin Milan, MD PCP:Pcp, No  Brief Narrative: Patient is a 65 y.o. 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 2/22>> CT chest with contrast: Nodular/cavitary airspace consolidation in the left upper/lower lobes 2/22>> Echo: EF 50-55% 2/24>> CT head: No acute intracranial process 2/24>> nuclear stress test: No ischemia  Antimicrobial therapy: Amphotericin/flucytosine: 2/21>>  Microbiology data: 2/21>> CSF culture: Cryptococcus neoformans 2/23>> sputum AFB smear negative 2/24>> sputum AFB smear: Negative 2/24>> CSF culture: Cryptococcus neoformans 2/28>> CSF culture: No growth  Procedures : LP>> 2/22, 2/24, 2/28  Consults: Neurology, cardiology Terri Skains), ID  DVT Prophylaxis : SCDs Start: 03/26/20 1946   Subjective: Lying comfortably in bed-no major issues.  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  ID recommending repeat LP on Monday.  Left lung cavitary lesion: AFB x2 - ID following-awaiting further work-up-May require bronchoscopy at some point.  Transient second-degree heart block: Asymptomatic-spoke with cardiology-Dr. Gardiner Fanti will evaluate and provide further recommendations.  HIV/AIDS: Noncompliant-timing for initiation of ART defer to ID.  SVT: Continue telemetry monitoring-on Cardizem-now with transient second-degree  heart block-await cardiology eval.  Nuclear stress test negative-echo with preserved EF.  TSH within normal limit  Recent COVID-19 infection: Asymptomatic-out of quarantine window.   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 Yes; Fluid consistency: Thin  Diet effective now                  Code Status: Full code  Family Communication: Will reach out to family over the next few days.  Disposition Plan: Status is: Inpatient  Remains inpatient appropriate because:Inpatient level of care appropriate due to severity of illness   Dispo:  Patient From: Home  Planned Disposition: Home  Medically stable for discharge: No     Barriers to Discharge: Cryptococcal meningitis-induction therapy with IV amphotericin B.   Antimicrobial agents: Anti-infectives (From admission, onward)   Start     Dose/Rate Route Frequency Ordered Stop   03/28/20 1000  azithromycin (ZITHROMAX) tablet 1,200 mg        1,200 mg Oral Weekly 03/28/20 0912     03/28/20 1000  sulfamethoxazole-trimethoprim (BACTRIM) 400-80 MG per tablet 1 tablet  Status:  Discontinued        1 tablet Oral Daily 03/28/20 0912 04/02/20 0919   03/27/20 2115  amphotericin B liposome (AMBISOME) 300 mg in dextrose 5 % 500 mL IVPB  Status:  Discontinued        300 mg 287.5 mL/hr over 120 Minutes Intravenous Every 24 hours 03/26/20 2024 03/26/20 2227   03/26/20 2227  amphotericin B liposome (AMBISOME) 300 mg in dextrose 5 % 500 mL IVPB  300 mg 287.5 mL/hr over 120 Minutes Intravenous Every 24 hours 03/26/20 2227     03/26/20 1945  amphotericin B liposome (AMBISOME) 300 mg in dextrose 5 % 500 mL IVPB  Status:  Discontinued        300 mg 287.5 mL/hr over 120 Minutes Intravenous  Every 24 hours 03/26/20 1850 03/26/20 2024   03/26/20 1900  flucytosine (ANCOBON) capsule 2,000 mg        25 mg/kg  80 kg Oral Every 6 hours 03/26/20 1850         Time spent: 15- minutes-Greater than 50% of this time was spent in counseling, explanation of diagnosis, planning of further management, and coordination of care.  MEDICATIONS: Scheduled Meds: . azithromycin  1,200 mg Oral Weekly  . dextromethorphan  30 mg Oral BID  . dextrose  10 mL Intravenous Q24H  . dextrose  10 mL Intravenous Q24H  . diltiazem  30 mg Oral Q8H  . flucytosine  25 mg/kg Oral Q6H  . sodium chloride  750 mL Intravenous Q24H  . sodium chloride  750 mL Intravenous Q24H   Continuous Infusions: . amphotericin  B  Liposome (AMBISOME) ADULT IV Stopped (04/07/20 0105)   PRN Meds:.acetaminophen **OR** acetaminophen, acetaminophen, diphenhydrAMINE **OR** diphenhydrAMINE, ibuprofen, ondansetron **OR** ondansetron (ZOFRAN) IV, oxyCODONE   PHYSICAL EXAM: Vital signs: Vitals:   04/06/20 2017 04/06/20 2339 04/07/20 0425 04/07/20 0753  BP: (!) 137/95 (!) 143/94 127/89 119/78  Pulse: 61 64 66 84  Resp: 16 16 15 16   Temp: 97.6 F (36.4 C) 98 F (36.7 C) 98.1 F (36.7 C) 97.9 F (36.6 C)  TempSrc: Oral Oral Oral Oral  SpO2: 100% 100% 99% 99%  Weight:      Height:       Filed Weights   03/26/20 1839 03/27/20 0141  Weight: 80 kg 80.8 kg   Body mass index is 23.5 kg/m.   Gen Exam:Alert awake-not in any distress HEENT:atraumatic, normocephalic Chest: B/L clear to auscultation anteriorly CVS:S1S2 regular Abdomen:soft non tender, non distended Extremities:no edema Neurology: Non focal Skin: no rash  I have personally reviewed following labs and imaging studies  LABORATORY DATA: CBC: Recent Labs  Lab 04/03/20 0442 04/04/20 0304 04/05/20 0407 04/06/20 0457 04/07/20 0301  WBC 3.6* 4.8 4.8 4.0 4.4  HGB 13.0 13.1 12.0* 12.2* 12.9*  HCT 36.2* 38.2* 34.3* 35.9* 36.4*  MCV 95.3 96.7 96.9 96.5 95.5   PLT 384 379 367 374 235    Basic Metabolic Panel: Recent Labs  Lab 04/03/20 0442 04/04/20 0304 04/04/20 0613 04/05/20 0407 04/06/20 0457 04/07/20 0301  NA 133* 131*  --  132* 133* 133*  K 4.7 5.5* 3.9 4.2 4.3 4.3  CL 103 103  --  104 107 104  CO2 22 19*  --  20* 18* 23  GLUCOSE 109* 128*  --  168* 107* 126*  BUN 15 19  --  15 14 12   CREATININE 1.27* 1.21  --  1.23 1.11 1.12  CALCIUM 9.1 8.8*  --  8.8* 9.0 9.2  MG 2.0 2.0  --  1.6* 1.8 1.8    GFR: Estimated Creatinine Clearance: 77.3 mL/min (by C-G formula based on SCr of 1.12 mg/dL).  Liver Function Tests: No results for input(s): AST, ALT, ALKPHOS, BILITOT, PROT, ALBUMIN in the last 168 hours. No results for input(s): LIPASE, AMYLASE in the last 168 hours. No results for input(s): AMMONIA in the last 168 hours.  Coagulation Profile: No results for input(s): INR, PROTIME in the last  168 hours.  Cardiac Enzymes: No results for input(s): CKTOTAL, CKMB, CKMBINDEX, TROPONINI in the last 168 hours.  BNP (last 3 results) No results for input(s): PROBNP in the last 8760 hours.  Lipid Profile: No results for input(s): CHOL, HDL, LDLCALC, TRIG, CHOLHDL, LDLDIRECT in the last 72 hours.  Thyroid Function Tests: No results for input(s): TSH, T4TOTAL, FREET4, T3FREE, THYROIDAB in the last 72 hours.  Anemia Panel: No results for input(s): VITAMINB12, FOLATE, FERRITIN, TIBC, IRON, RETICCTPCT in the last 72 hours.  Urine analysis: No results found for: COLORURINE, APPEARANCEUR, LABSPEC, [place], GLUCOSEU, HGBUR, BILIRUBINUR, KETONESUR, PROTEINUR, UROBILINOGEN, NITRITE, LEUKOCYTESUR  Sepsis Labs: Lactic Acid, Venous    Component Value Date/Time   LATICACIDVEN 0.9 03/26/2020 0958    MICROBIOLOGY: Recent Results (from the past 240 hour(s))  Acid Fast Smear (AFB)     Status: None   Collection Time: 03/28/20 12:00 PM   Specimen: Sputum  Result Value Ref Range Status   AFB Specimen Processing Concentration  Final   Acid  Fast Smear Negative  Final    Comment: (NOTE) Performed At: [facility] [address] [place], [place] [id number] [name] MD [id number]    Source (AFB) SPU  Final    Comment: Performed at [facility], [place] [address], [place], [place] [address]  Fungus Culture With Stain     Status: Abnormal (Preliminary result)   Collection Time: 03/28/20 12:00 PM   Specimen: Sputum  Result Value Ref Range Status   Fungus Stain Final report (A)  Final    Comment: (NOTE) Performed At: [facility] [address] [place], [place] [id number] [name] MD [id number]    Fungus (Mycology) Culture PENDING  Incomplete   Fungal Source SPU  Final    Comment: Performed at [facility], [place] [address] [place], [place] [address]  Fungus Culture Result     Status: Abnormal   Collection Time: 03/28/20 12:00 PM  Result Value Ref Range Status   Result 1 Comment (A)  Final    Comment: (NOTE) Fungal elements, such as arthroconidia, hyphal fragments, chlamydoconidia, observed. Performed At: [facility] [place], [place] [id number] [name] MD [id number]   Acid Fast Smear (AFB)     Status: None   Collection Time: 03/29/20  1:56 PM   Specimen: Sputum  Result Value Ref Range Status   AFB Specimen Processing Concentration  Final   Acid Fast Smear Negative  Final    Comment: (NOTE) Performed At: [facility] [place], [place] [id number] [name] MD [id number]    Source (AFB) SPUTUM  Final    Comment: Performed at [facility], [place] [address] [place], [place] [address]  Fungus Culture With Stain     Status: None (Preliminary result)   Collection Time: 03/29/20  1:56 PM  Result Value Ref Range Status   Fungus Stain Final report  Final    Comment: (NOTE) Performed At: [facility] [place], [place] [id number] [name] MD [id number]    Fungus (Mycology)  Culture PENDING  Incomplete   Fungal Source SPUTUM  Final    Comment: Performed at [facility], [place] [address], [place], [place] [address]  Fungus Culture Result     Status: None   Collection Time: 03/29/20  1:56 PM  Result Value Ref Range Status   Result 1 Comment  Final    Comment: (NOTE) KOH/Calcofluor preparation:  no fungus observed. Performed  At: [facility] [address] [place], [place] [id number] [name] MD [id number]   Fungus Culture With Stain     Status: None (Preliminary result)   Collection Time: 03/29/20  4:24 PM  Result Value Ref Range Status   Fungus Stain Final report  Final    Comment: (NOTE) Performed At: [facility] [address] [place], [place] [id number] [name] MD [id number]    Fungus (Mycology) Culture PENDING  Incomplete   Fungal Source CSF  Final    Comment: Performed at [facility], [place] [address], [place], [place] [address]  CSF culture w Gram Stain     Status: Abnormal   Collection Time: 03/29/20  4:24 PM   Specimen: CSF; Cerebrospinal Fluid  Result Value Ref Range Status   Specimen Description CSF  Final   Special Requests SAMPLE IN FRIDGE PRIOR TO ADDING ON CSF CULTURE  Final   Gram Stain   Final    WBC PRESENT,BOTH PMN AND MONONUCLEAR CYTOSPIN SMEAR CORRECTED RESULTS NO ORGANISMS SEEN PREVIOUSLY REPORTED AS: CRYPTOCOCCUS NEOFORMANS CORRECTED RESULTS CALLED TO: RN SONYA B [id number] FCP    Culture (A)  Final    CRYPTOCOCCUS NEOFORMANS CRITICAL VALUE NOTED.  VALUE IS CONSISTENT WITH PREVIOUSLY REPORTED AND CALLED VALUE. Performed at [facility], [place] [address], [place], [place] [address]    Report Status 04/04/2020 FINAL  Final  Fungus Culture Result     Status: None   Collection Time: 03/29/20  4:24 PM  Result Value Ref Range Status   Result 1 Comment  Final    Comment: (NOTE) KOH/Calcofluor preparation:  no fungus observed. Performed At: [facility] [place], [place] [id number] [name] MD [id number]   Expectorated Sputum Assessment w Gram Stain, Rflx to Resp Cult     Status: None   Collection Time: 03/30/20 10:50 AM   Specimen: Expectorated Sputum  Result Value Ref Range Status   Specimen Description EXPECTORATED SPUTUM  Final   Special Requests NONE  Final   Sputum evaluation   Final    Sputum specimen not acceptable for testing.  Please recollect.   RESULT CALLED TO, READ BACK BY AND VERIFIED WITH: ORODOSA RN 03/31/20 0539 JDW Performed at [place] [address] [place], [place] [address]    Report Status 03/31/2020 FINAL  Final  Expectorated Sputum Assessment w Gram Stain, Rflx to Resp Cult     Status: None   Collection Time: 03/31/20  9:12 AM   Specimen: Expectorated Sputum  Result Value Ref Range Status   Specimen Description EXPECTORATED SPUTUM  Final   Special Requests Immunocompromised  Final   Sputum evaluation   Final    Sputum specimen not acceptable for testing.  Please recollect.   Gram Stain Report Called to,Read Back By and Verified With: RN A LEWIS AT 1717 03/31/2020 BY L BENFIELD Performed at [facility], [place] [address], [place], [place] [address]    Report Status 03/31/2020 FINAL  Final  CSF culture w Gram Stain     Status: None   Collection Time: 04/02/20  3:00 PM   Specimen: CSF  Result Value Ref Range Status   Specimen Description CSF  Final   Special Requests NONE  Final   Gram Stain   Final    WBC PRESENT, PREDOMINANTLY MONONUCLEAR NO ORGANISMS SEEN CYTOSPIN SMEAR    Culture   Final    NO GROWTH 3 DAYS Performed at [facility], [place] [address] [place], [place] [address]    Report Status 04/05/2020 FINAL  Final  Blastomyces Antigen     Status: None   Collection Time: 04/03/20  4:42 AM   Specimen: Blood  Result Value Ref Range Status   Blastomyces Antigen None Detected None Detected ng/mL Final    Comment: (NOTE) Results reported as ng/mL in 0.2 - 14.7  ng/mL range Results above the limit of detection but below 0.2 ng/mL are reported as 'Positive, Below the Limit of Quantification' Results above 14.7 ng/mL are reported as 'Positive, Above the Limit of Quantification'    Specimen Type SERUM  Final    Comment: (NOTE) Performed At: [facility] [place], [place] [id number] Bruce Donath MD GR:[id number]     RADIOLOGY STUDIES/RESULTS: No results found.   LOS: 12 days   Oren Binet, MD  [facility]    To contact the attending provider between 7A-7P or the covering provider during after hours 7P-7A, please log into the web site [email or web address] and access using universal [place] password for that web site. If you do not have the password, please call the hospital operator.  04/07/2020, 10:29 AM

## 2020-04-07 NOTE — Progress Notes (Signed)
Subjective:  Presently doing well, no dyspnea, complaints of cough.  States that he slept well.  No dizziness or syncope or palpitations.  Intake/Output from previous day:  I/O last 3 completed shifts: In: 4667.6 [IV Piggyback:4667.6] Out: 200 [Urine:200] Total I/O In: 24696.1 [IV Piggyback:24696.1] Out: -   Blood pressure 119/78, pulse 84, temperature 97.9 F (36.6 C), temperature source Oral, resp. rate 16, height 6' 1"  (1.854 m), weight 80.8 kg, SpO2 99 %. Physical Exam Constitutional:      Appearance: He is normal weight.  HENT:     Head: Atraumatic.  Cardiovascular:     Rate and Rhythm: Normal rate and regular rhythm.     Pulses: Intact distal pulses.     Heart sounds: Normal heart sounds. No murmur heard. No gallop.      Comments: No leg edema, no JVD. Pulmonary:     Effort: Pulmonary effort is normal.     Breath sounds: Wheezing (left base) and rales (left base) present.  Abdominal:     General: Bowel sounds are normal.     Palpations: Abdomen is soft.  Musculoskeletal:        General: Normal range of motion.     Cervical back: Neck supple.  Neurological:     Mental Status: He is alert.     Lab Results: BNP (last 3 results) No results for input(s): BNP in the last 8760 hours.  ProBNP (last 3 results) No results for input(s): PROBNP in the last 8760 hours. BMP Latest Ref Rng & Units 04/07/2020 04/06/2020 04/05/2020  Glucose 70 - 99 mg/dL 126(H) 107(H) 168(H)  BUN 8 - 23 mg/dL 12 14 15   Creatinine 0.61 - 1.24 mg/dL 1.12 1.11 1.23  Sodium 135 - 145 mmol/L 133(L) 133(L) 132(L)  Potassium 3.5 - 5.1 mmol/L 4.3 4.3 4.2  Chloride 98 - 111 mmol/L 104 107 104  CO2 22 - 32 mmol/L 23 18(L) 20(L)  Calcium 8.9 - 10.3 mg/dL 9.2 9.0 8.8(L)   Hepatic Function Latest Ref Rng & Units 03/31/2020 03/30/2020 03/29/2020  Total Protein 6.5 - 8.1 g/dL 7.2 7.3 7.7  Albumin 3.5 - 5.0 g/dL 2.8(L) 2.8(L) 3.0(L)  AST 15 - 41 U/L 24 23 27   ALT 0 - 44 U/L 17 15 17   Alk Phosphatase 38 - 126  U/L 100 82 93  Total Bilirubin 0.3 - 1.2 mg/dL 0.8 1.2 1.0   CBC Latest Ref Rng & Units 04/07/2020 04/06/2020 04/05/2020  WBC 4.0 - 10.5 K/uL 4.4 4.0 4.8  Hemoglobin 13.0 - 17.0 g/dL 12.9(L) 12.2(L) 12.0(L)  Hematocrit 39.0 - 52.0 % 36.4(L) 35.9(L) 34.3(L)  Platelets 150 - 400 K/uL 380 374 367   Lipid Panel     Component Value Date/Time   CHOL 141 03/29/2020 0255   TRIG 118 03/29/2020 0255   HDL 31 (L) 03/29/2020 0255   CHOLHDL 4.5 03/29/2020 0255   VLDL 24 03/29/2020 0255   LDLCALC 86 03/29/2020 0255   Cardiac Panel (last 3 results) No results for input(s): CKTOTAL, CKMB, TROPONINI, RELINDX in the last 72 hours.  HEMOGLOBIN A1C No results found for: HGBA1C, MPG TSH Recent Labs    03/27/20 0949  TSH 0.628   Cardiac Studies:  Telemetry: Normal sinus rhythm, sinus arrhythmia, occasional PACs.  No further SVT as of 04/07/2020.  EKG 03/27/2020 at 3:19: Supraventricular tachycardia 179 bpm, LVH per voltage criteria, ST-T changes most likely secondary to repolarization abnormality.   Echocardiogram 03/28/2020: 1. Left ventricular ejection fraction, by estimation, is 50 to 55%. The  left ventricle has low normal function. The left ventricle has no regional  wall motion abnormalities. There is mild concentric left ventricular  hypertrophy. Left ventricular  diastolic parameters are indeterminate.  2. Right ventricular systolic function is normal. The right ventricular  size is normal.  3. Left atrial size was mildly dilated.  4. Right atrial size was mildly dilated.  5. The mitral valve is normal in structure. Trivial mitral valve  regurgitation. No evidence of mitral stenosis.  6. The aortic valve is tricuspid. Aortic valve regurgitation is not  visualized. No aortic stenosis is present.  7. The inferior vena cava is normal in size with greater than 50%  respiratory variability, suggesting right atrial pressure of 3 mmHg.  Scheduled Meds: . azithromycin  1,200 mg Oral  Weekly  . dextromethorphan  30 mg Oral BID  . dextrose  10 mL Intravenous Q24H  . dextrose  10 mL Intravenous Q24H  . diltiazem  30 mg Oral Q8H  . flucytosine  25 mg/kg Oral Q6H  . sodium chloride  750 mL Intravenous Q24H  . sodium chloride  750 mL Intravenous Q24H   Continuous Infusions: . amphotericin  B  Liposome (AMBISOME) ADULT IV Stopped (04/07/20 0105)   PRN Meds:.acetaminophen **OR** acetaminophen, acetaminophen, diphenhydrAMINE **OR** diphenhydrAMINE, ibuprofen, ondansetron **OR** ondansetron (ZOFRAN) IV, oxyCODONE  Assessment/Plan:  Ricky [name] is a 65 y.o. AAM patient male whose past medical history and cardiac risk factors include: HIV, noncompliance, cigarette smoking, recent COVID-19 infection (January 2022), hypertension, protein calorie malnutrition, aortic atherosclerosis, cavitary consolidation in the left lung fields on recent CT imaging, emphysema.  1.  PSVT presently stable on diltiazem, would recommend switching to long-acting diltiazem to improve compliance in a patient who has got noncompliance issues. I reviewed the telemetry today, there is no significant heart block, no Mobitz block, over the past 24 hours he has not had any recurrence of SVT.  Telemetry rhythm strips reveal sinus arrhythmia, PACs and patient was asleep and there is no significant pause or missed beat.  Order placed. 2.  Aortic atherosclerosis, with regard to aspirin or statin, in view of no established significant atherosclerotic cardiovascular disease except for aortic atherosclerosis, do not feel it is necessary and also potentially can increase risk of GI bleed, statin therapy has been deferred however we could certainly consider starting him on pravastatin 20 mg daily at a low dose for primary prevention.  Order placed. 3.  Primary hypertension, well controlled on diltiazem.  Continue the same. Call if questions.   Adrian Prows, MD, [facility] 04/07/2020, 11:05 AM Office: [phone] Pager:  [phone]

## 2020-04-07 NOTE — Progress Notes (Signed)
Telemetry called to report pt having 6 beats run of SVT. Pt asymptomatic in bed watching TV. MD notified, no new orders received. Will continue to closely monitor. Delia Heady RN

## 2020-04-07 NOTE — Progress Notes (Signed)
Notified On call MD of HR 38 unsustained, Magnesium stopped per provider. MD advised event to be passed to Day RN to notify MD for Cardiology consult. Patient has no abnormal symptoms at this time, resting in bed . Will continue to monitor patient.

## 2020-04-08 DIAGNOSIS — B20 Human immunodeficiency virus [HIV] disease: Secondary | ICD-10-CM | POA: Diagnosis not present

## 2020-04-08 DIAGNOSIS — B451 Cerebral cryptococcosis: Secondary | ICD-10-CM | POA: Diagnosis not present

## 2020-04-08 LAB — BASIC METABOLIC PANEL
Anion gap: 10 (ref 5–15)
BUN: 16 mg/dL (ref 8–23)
CO2: 21 mmol/L — ABNORMAL LOW (ref 22–32)
Calcium: 9.3 mg/dL (ref 8.9–10.3)
Chloride: 102 mmol/L (ref 98–111)
Creatinine, Ser: 1.03 mg/dL (ref 0.61–1.24)
GFR, Estimated: >60 mL/min (ref >60)
Glucose, Bld: 112 mg/dL — ABNORMAL HIGH (ref 70–99)
Potassium: 4.1 mmol/L (ref 3.5–5.1)
Sodium: 133 mmol/L — ABNORMAL LOW (ref 135–145)

## 2020-04-08 LAB — CBC
HCT: 35.8 % — ABNORMAL LOW (ref 39.0–52.0)
Hemoglobin: 12.4 g/dL — ABNORMAL LOW (ref 13.0–17.0)
MCH: 33.2 pg (ref 26.0–34.0)
MCHC: 34.6 g/dL (ref 30.0–36.0)
MCV: 96 fL (ref 80.0–100.0)
Platelets: 352 10*3/uL (ref 150–400)
RBC: 3.73 MIL/uL — ABNORMAL LOW (ref 4.22–5.81)
RDW: 11.4 % — ABNORMAL LOW (ref 11.5–15.5)
WBC: 5.2 10*3/uL (ref 4.0–10.5)
nRBC: 0 % (ref 0.0–0.2)

## 2020-04-08 LAB — MAGNESIUM: Magnesium: 1.6 mg/dL — ABNORMAL LOW (ref 1.7–2.4)

## 2020-04-08 MED ORDER — MAGNESIUM SULFATE 4 GM/100ML IV SOLN
4.0000 g | Freq: Once | INTRAVENOUS | Status: AC
  Administered 2020-04-08: 4 g via INTRAVENOUS
  Filled 2020-04-08: qty 100

## 2020-04-08 NOTE — Progress Notes (Signed)
[place] for Infectious Disease   Reason for visit: Follow up on cryptococcal meningitis  Interval History: no headache, WBC wnl, creat, K wnl; Mag near normal and replaced.  No new vision changes.  Some dizziness with standing up but does not lose his balance.  Day 14 induction with ampho/flucytosine   Physical Exam: Constitutional:  Vitals:   04/08/20 0422 04/08/20 0757  BP: 114/71 109/76  Pulse: (!) 59 80  Resp: 15 16  Temp: 98.2 F (36.8 C) 98 F (36.7 C)  SpO2: 97% 98%   patient appears in NAD Respiratory: Normal respiratory effort; CTA B Cardiovascular: RRR GI: soft, nt, nd  Review of Systems: Constitutional: negative for fevers and chills Respiratory: negative for cough Gastrointestinal: negative for nausea and diarrhea Integument/breast: negative for rash Neurological: positive for dizziness, negative for headaches  Lab Results  Component Value Date   WBC 5.2 04/08/2020   HGB 12.4 (L) 04/08/2020   HCT 35.8 (L) 04/08/2020   MCV 96.0 04/08/2020   PLT 352 04/08/2020    Lab Results  Component Value Date   CREATININE 1.03 04/08/2020   BUN 16 04/08/2020   NA 133 (L) 04/08/2020   K 4.1 04/08/2020   CL 102 04/08/2020   CO2 21 (L) 04/08/2020    Lab Results  Component Value Date   ALT 17 03/31/2020   AST 24 03/31/2020   ALKPHOS 100 03/31/2020     Microbiology: Recent Results (from the past 240 hour(s))  Acid Fast Smear (AFB)     Status: None   Collection Time: 03/29/20  1:56 PM   Specimen: Sputum  Result Value Ref Range Status   AFB Specimen Processing Concentration  Final   Acid Fast Smear Negative  Final    Comment: (NOTE) Performed At: [facility] [address] [place], [place] [id number] [name] MD GD:[id number]    Source (AFB) SPUTUM  Final    Comment: Performed at [facility], [place] [address], [place], [place] [address]  Fungus Culture With Stain     Status: None (Preliminary result)   Collection Time:  03/29/20  1:56 PM  Result Value Ref Range Status   Fungus Stain Final report  Final    Comment: (NOTE) Performed At: [facility] [place], [place] [id number] [name] MD [id number]    Fungus (Mycology) Culture PENDING  Incomplete   Fungal Source SPUTUM  Final    Comment: Performed at [facility], [place] [address], [place], [place] [address]  Fungus Culture Result     Status: None   Collection Time: 03/29/20  1:56 PM  Result Value Ref Range Status   Result 1 Comment  Final    Comment: (NOTE) KOH/Calcofluor preparation:  no fungus observed. Performed At: [facility] [address] [place], [place] [id number] [name] MD IF:[id number]   Fungus Culture With Stain     Status: None (Preliminary result)   Collection Time: 03/29/20  4:24 PM  Result Value Ref Range Status   Fungus Stain Final report  Final    Comment: (NOTE) Performed At: [facility] [address], [place] [id number] [name] MD EZ:[id number]    Fungus (Mycology) Culture PENDING  Incomplete   Fungal Source CSF  Final    Comment: Performed at [facility], [place] [address], [place],  [address]  CSF culture w Gram Stain     Status: Abnormal   Collection Time: 03/29/20  4:24 PM  Specimen: CSF; Cerebrospinal Fluid  Result Value Ref Range Status   Specimen Description CSF  Final   Special Requests SAMPLE IN FRIDGE PRIOR TO ADDING ON CSF CULTURE  Final   Gram Stain   Final    WBC PRESENT,BOTH PMN AND MONONUCLEAR CYTOSPIN SMEAR CORRECTED RESULTS NO ORGANISMS SEEN PREVIOUSLY REPORTED AS: CRYPTOCOCCUS NEOFORMANS CORRECTED RESULTS CALLED TO: RN SONYA B 44 [id number] FCP    Culture (A)  Final    CRYPTOCOCCUS NEOFORMANS CRITICAL VALUE NOTED.  VALUE IS CONSISTENT WITH PREVIOUSLY REPORTED AND CALLED VALUE. Performed at [facility], [place] [address], [place], [place] [address]    Report Status 04/04/2020 FINAL  Final  Fungus Culture  Result     Status: None   Collection Time: 03/29/20  4:24 PM  Result Value Ref Range Status   Result 1 Comment  Final    Comment: (NOTE) KOH/Calcofluor preparation:  no fungus observed. Performed At: [facility] [place], [place] [id number] [name] MD AC:[id number]   Expectorated Sputum Assessment w Gram Stain, Rflx to Resp Cult     Status: None   Collection Time: 03/30/20 10:50 AM   Specimen: Expectorated Sputum  Result Value Ref Range Status   Specimen Description EXPECTORATED SPUTUM  Final   Special Requests NONE  Final   Sputum evaluation   Final    Sputum specimen not acceptable for testing.  Please recollect.   RESULT CALLED TO, READ BACK BY AND VERIFIED WITH: ORODOSA RN 03/31/20 0539 JDW Performed at [facility] [address] [place], [place] [address]    Report Status 03/31/2020 FINAL  Final  Expectorated Sputum Assessment w Gram Stain, Rflx to Resp Cult     Status: None   Collection Time: 03/31/20  9:12 AM   Specimen: Expectorated Sputum  Result Value Ref Range Status   Specimen Description EXPECTORATED SPUTUM  Final   Special Requests Immunocompromised  Final   Sputum evaluation   Final    Sputum specimen not acceptable for testing.  Please recollect.   Gram Stain Report Called to,Read Back By and Verified With: RN A LEWIS AT 1717 03/31/2020 BY L BENFIELD Performed at [facility], [place] [address] [place], [place] [address]    Report Status 03/31/2020 FINAL  Final  CSF culture w Gram Stain     Status: None   Collection Time: 04/02/20  3:00 PM   Specimen: CSF  Result Value Ref Range Status   Specimen Description CSF  Final   Special Requests NONE  Final   Gram Stain   Final    WBC PRESENT, PREDOMINANTLY MONONUCLEAR NO ORGANISMS SEEN CYTOSPIN SMEAR    Culture   Final    NO GROWTH 3 DAYS Performed at [facility], [place] [address], [place], [place] [address]    Report Status 04/05/2020 FINAL  Final  Blastomyces  Antigen     Status: None   Collection Time: 04/03/20  4:42 AM   Specimen: Blood  Result Value Ref Range Status   Blastomyces Antigen None Detected None Detected ng/mL Final    Comment: (NOTE) Results reported as ng/mL in 0.2 - 14.7 ng/mL range Results above the limit of detection but below 0.2 ng/mL are reported as 'Positive, Below the Limit of Quantification' Results above 14.7 ng/mL are reported as 'Positive, Above the Limit of Quantification'    Specimen Type SERUM  Final    Comment: (NOTE) Performed At: [facility], [place] [id number] Bruce Donath MD RC:[id number]     Impression/Plan:  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  Will continue with ampho + flucytosine and can reevaluate CSF this week to see if he can transition to maintenance.    2. HIV - holding on ARVs due to #1.    3.  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   Dr. Tommy Medal to follow up tomorrow.

## 2020-04-08 NOTE — Progress Notes (Signed)
PROGRESS NOTE        PATIENT DETAILS Name: Ricky [name] Age: 65 y.o. Sex: male Date of Birth: 10-27-1955 Admit Date: 03/26/2020 Admitting Physician Reubin Milan, MD PCP:Pcp, No  Brief Narrative: Patient is a 65 y.o. 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 2/22>> CT chest with contrast: Nodular/cavitary airspace consolidation in the left upper/lower lobes 2/22>> Echo: EF 50-55% 2/24>> CT head: No acute intracranial process 2/24>> nuclear stress test: No ischemia  Antimicrobial therapy: Amphotericin/flucytosine: 2/21>>  Microbiology data: 2/21>> CSF culture: Cryptococcus neoformans 2/23>> sputum AFB smear negative 2/24>> sputum AFB smear: Negative 2/24>> CSF culture: Cryptococcus neoformans 2/28>> CSF culture: No growth  Procedures : LP>> 2/22, 2/24, 2/28  Consults: Neurology, cardiology Terri Skains), ID  DVT Prophylaxis : SCDs Start: 03/26/20 1946   Subjective: Lying comfortably in bed-no major issues.  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  HIV/AIDS: Noncompliant-timing for initiation of ART defer to ID.  SVT: Continue telemetry monitoring-on Cardizem-now with transient second-degree heart block-await cardiology eval.  Nuclear stress test negative-echo  with preserved EF.  TSH within normal limit  Recent COVID-19 infection: Asymptomatic-out of quarantine window.   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 Yes; Fluid consistency: Thin  Diet effective now                  Code Status: Full code  Family Communication: Will reach out to family over the next few days.  Disposition Plan: Status is: Inpatient  Remains inpatient appropriate because:Inpatient level of care appropriate due to severity of illness   Dispo:  Patient From: Home  Planned Disposition: Home  Medically stable for discharge: No     Barriers to Discharge: Cryptococcal meningitis-induction therapy with IV amphotericin B.   Antimicrobial agents: Anti-infectives (From admission, onward)   Start     Dose/Rate Route Frequency Ordered Stop   03/28/20 1000  azithromycin (ZITHROMAX) tablet 1,200 mg        1,200 mg Oral Weekly 03/28/20 0912     03/28/20 1000  sulfamethoxazole-trimethoprim (BACTRIM) 400-80 MG per tablet 1 tablet  Status:  Discontinued        1 tablet Oral Daily 03/28/20 0912 04/02/20 0919   03/27/20 2115  amphotericin B liposome (AMBISOME) 300 mg in dextrose 5 % 500 mL IVPB  Status:  Discontinued        300 mg 287.5 mL/hr over 120 Minutes Intravenous Every 24 hours 03/26/20 2024 03/26/20 2227   03/26/20 2227  amphotericin B liposome (AMBISOME) 300 mg in dextrose 5 % 500 mL IVPB        300 mg 287.5 mL/hr over 120 Minutes Intravenous Every  24 hours 03/26/20 2227 04/09/20 2359   03/26/20 1945  amphotericin B liposome (AMBISOME) 300 mg in dextrose 5 % 500 mL IVPB  Status:  Discontinued        300 mg 287.5 mL/hr over 120 Minutes Intravenous Every 24 hours 03/26/20 1850 03/26/20 2024   03/26/20 1900   flucytosine (ANCOBON) capsule 2,000 mg        25 mg/kg  80 kg Oral Every 6 hours 03/26/20 1850 04/09/20 2359       Time spent: 15- minutes-Greater than 50% of this time was spent in counseling, explanation of diagnosis, planning of further management, and coordination of care.  MEDICATIONS: Scheduled Meds: . azithromycin  1,200 mg Oral Weekly  . dextromethorphan  30 mg Oral BID  . dextrose  10 mL Intravenous Q24H  . dextrose  10 mL Intravenous Q24H  . diltiazem  180 mg Oral Daily  . flucytosine  25 mg/kg Oral Q6H  . pravastatin  20 mg Oral QPM  . sodium chloride  750 mL Intravenous Q24H  . sodium chloride  750 mL Intravenous Q24H   Continuous Infusions: . amphotericin  B  Liposome (AMBISOME) ADULT IV 300 mg (04/07/20 2104)   PRN Meds:.acetaminophen **OR** acetaminophen, acetaminophen, diphenhydrAMINE **OR** diphenhydrAMINE, ibuprofen, ondansetron **OR** ondansetron (ZOFRAN) IV, oxyCODONE   PHYSICAL EXAM: Vital signs: Vitals:   04/07/20 2002 04/08/20 0035 04/08/20 0422 04/08/20 0757  BP: 127/86 132/85 114/71 109/76  Pulse: 65 63 (!) 59 80  Resp: 15 14 15 16   Temp: 98.3 F (36.8 C) 98.1 F (36.7 C) 98.2 F (36.8 C) 98 F (36.7 C)  TempSrc: Oral Oral Oral Oral  SpO2: 100% 98% 97% 98%  Weight:      Height:       Filed Weights   03/26/20 1839 03/27/20 0141  Weight: 80 kg 80.8 kg   Body mass index is 23.5 kg/m.   Gen Exam:Alert awake-not in any distress HEENT:atraumatic, normocephalic Chest: B/L clear to auscultation anteriorly CVS:S1S2 regular Abdomen:soft non tender, non distended Extremities:no edema Neurology: Non focal Skin: no rash  I have personally reviewed following labs and imaging studies  LABORATORY DATA: CBC: Recent Labs  Lab 04/04/20 0304 04/05/20 0407 04/06/20 0457 04/07/20 0301 04/08/20 0235  WBC 4.8 4.8 4.0 4.4 5.2  HGB 13.1 12.0* 12.2* 12.9* 12.4*  HCT 38.2* 34.3* 35.9* 36.4* 35.8*  MCV 96.7 96.9 96.5 95.5 96.0  PLT 379 367 374  380 132    Basic Metabolic Panel: Recent Labs  Lab 04/04/20 0304 04/04/20 0613 04/05/20 0407 04/06/20 0457 04/07/20 0301 04/08/20 0235  NA 131*  --  132* 133* 133* 133*  K 5.5* 3.9 4.2 4.3 4.3 4.1  CL 103  --  104 107 104 102  CO2 19*  --  20* 18* 23 21*  GLUCOSE 128*  --  168* 107* 126* 112*  BUN 19  --  15 14 12 16   CREATININE 1.21  --  1.23 1.11 1.12 1.03  CALCIUM 8.8*  --  8.8* 9.0 9.2 9.3  MG 2.0  --  1.6* 1.8 1.8 1.6*    GFR: Estimated Creatinine Clearance: 84 mL/min (by C-G formula based on SCr of 1.03 mg/dL).  Liver Function Tests: No results for input(s): AST, ALT, ALKPHOS, BILITOT, PROT, ALBUMIN in the last 168 hours. No results for input(s): LIPASE, AMYLASE in the last 168 hours. No results for input(s): AMMONIA in the last 168 hours.  Coagulation Profile: No results for input(s): INR, PROTIME in the last 168  hours.  Cardiac Enzymes: No results for input(s): CKTOTAL, CKMB, CKMBINDEX, TROPONINI in the last 168 hours.  BNP (last 3 results) No results for input(s): PROBNP in the last 8760 hours.  Lipid Profile: No results for input(s): CHOL, HDL, LDLCALC, TRIG, CHOLHDL, LDLDIRECT in the last 72 hours.  Thyroid Function Tests: No results for input(s): TSH, T4TOTAL, FREET4, T3FREE, THYROIDAB in the last 72 hours.  Anemia Panel: No results for input(s): VITAMINB12, FOLATE, FERRITIN, TIBC, IRON, RETICCTPCT in the last 72 hours.  Urine analysis: No results found for: COLORURINE, APPEARANCEUR, LABSPEC, PHURINE, GLUCOSEU, HGBUR, BILIRUBINUR, KETONESUR, PROTEINUR, UROBILINOGEN, NITRITE, LEUKOCYTESUR  Sepsis Labs: Lactic Acid, Venous    Component Value Date/Time   LATICACIDVEN 0.9 03/26/2020 0958    MICROBIOLOGY: Recent Results (from the past 240 hour(s))  Acid Fast Smear (AFB)     Status: None   Collection Time: 03/29/20  1:56 PM   Specimen: Sputum  Result Value Ref Range Status   AFB Specimen Processing Concentration  Final   Acid Fast Smear Negative   Final    Comment: (NOTE) Performed At: [facility] [address] [place], [place] [id number] [name] MD PO:[id number]    Source (AFB) SPUTUM  Final    Comment: Performed at [facility], [place] [address], [place], [place] [address]  Fungus Culture With Stain     Status: None (Preliminary result)   Collection Time: 03/29/20  1:56 PM  Result Value Ref Range Status   Fungus Stain Final report  Final    Comment: (NOTE) Performed At: [facility] [place], [place] [id number] [name] MD [id number]    Fungus (Mycology) Culture PENDING  Incomplete   Fungal Source SPUTUM  Final    Comment: Performed at [facility], [place] [address], [place], [place] [address]  Fungus Culture Result     Status: None   Collection Time: 03/29/20  1:56 PM  Result Value Ref Range Status   Result 1 Comment  Final    Comment: (NOTE) KOH/Calcofluor preparation:  no fungus observed. Performed At: [facility] [address] [place], [place] [id number] [name] MD HA:[id number]   Fungus Culture With Stain     Status: None (Preliminary result)   Collection Time: 03/29/20  4:24 PM  Result Value Ref Range Status   Fungus Stain Final report  Final    Comment: (NOTE) Performed At: [facility] [address] [place], [place] [id number] [name] MD [id number]    Fungus (Mycology) Culture PENDING  Incomplete   Fungal Source CSF  Final    Comment: Performed at [facility], [place] [address] [place], [place] [address]  CSF culture w Gram Stain     Status: Abnormal   Collection Time: 03/29/20  4:24 PM   Specimen: CSF; Cerebrospinal Fluid  Result Value Ref Range Status   Specimen Description CSF  Final   Special Requests SAMPLE IN FRIDGE PRIOR TO ADDING ON CSF CULTURE  Final   Gram Stain   Final    WBC PRESENT,BOTH PMN AND MONONUCLEAR CYTOSPIN SMEAR CORRECTED RESULTS NO ORGANISMS SEEN PREVIOUSLY REPORTED AS:  CRYPTOCOCCUS NEOFORMANS CORRECTED RESULTS CALLED TO: RN SONYA B 35 [id number] FCP    Culture (A)  Final    CRYPTOCOCCUS NEOFORMANS CRITICAL VALUE NOTED.  VALUE IS CONSISTENT WITH PREVIOUSLY REPORTED AND CALLED VALUE. Performed at [facility], [place] [address], [place], [place] [address]    Report Status 04/04/2020 FINAL  Final  Fungus  Culture Result     Status: None   Collection Time: 03/29/20  4:24 PM  Result Value Ref Range Status   Result 1 Comment  Final    Comment: (NOTE) KOH/Calcofluor preparation:  no fungus observed. Performed At: [facility] [place], [place] [id number] [name] MD [id number]   Expectorated Sputum Assessment w Gram Stain, Rflx to Resp Cult     Status: None   Collection Time: 03/30/20 10:50 AM   Specimen: Expectorated Sputum  Result Value Ref Range Status   Specimen Description EXPECTORATED SPUTUM  Final   Special Requests NONE  Final   Sputum evaluation   Final    Sputum specimen not acceptable for testing.  Please recollect.   RESULT CALLED TO, READ BACK BY AND VERIFIED WITH: ORODOSA RN 03/31/20 0539 JDW Performed at [facility] [address], [place], [place] [address]    Report Status 03/31/2020 FINAL  Final  Expectorated Sputum Assessment w Gram Stain, Rflx to Resp Cult     Status: None   Collection Time: 03/31/20  9:12 AM   Specimen: Expectorated Sputum  Result Value Ref Range Status   Specimen Description EXPECTORATED SPUTUM  Final   Special Requests Immunocompromised  Final   Sputum evaluation   Final    Sputum specimen not acceptable for testing.  Please recollect.   Gram Stain Report Called to,Read Back By and Verified With: RN A LEWIS AT 1717 03/31/2020 BY L BENFIELD Performed at [facility], [place] [address] [place], [place] [address]    Report Status 03/31/2020 FINAL  Final  CSF culture w Gram Stain     Status: None   Collection Time: 04/02/20  3:00 PM   Specimen: CSF  Result Value Ref  Range Status   Specimen Description CSF  Final   Special Requests NONE  Final   Gram Stain   Final    WBC PRESENT, PREDOMINANTLY MONONUCLEAR NO ORGANISMS SEEN CYTOSPIN SMEAR    Culture   Final    NO GROWTH 3 DAYS Performed at [facility], [place] [address] [place],  [address]    Report Status 04/05/2020 FINAL  Final  Blastomyces Antigen     Status: None   Collection Time: 04/03/20  4:42 AM   Specimen: Blood  Result Value Ref Range Status   Blastomyces Antigen None Detected None Detected ng/mL Final    Comment: (NOTE) Results reported as ng/mL in 0.2 - 14.7 ng/mL range Results above the limit of detection but below 0.2 ng/mL are reported as 'Positive, Below the Limit of Quantification' Results above 14.7 ng/mL are reported as 'Positive, Above the Limit of Quantification'    Specimen Type SERUM  Final    Comment: (NOTE) Performed At: [facility] [place],  [id number] Bruce Donath MD SJ:[id number]     RADIOLOGY STUDIES/RESULTS: No results found.   LOS: 13 days   Oren Binet, MD  [facility]    To contact the attending provider between 7A-7P or the covering provider during after hours 7P-7A, please log into the web site [email or web address] and access using universal [facility] password for that web site. If you do not have the password, please call the hospital operator.  04/08/2020, 11:33 AM

## 2020-04-08 NOTE — Progress Notes (Signed)
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 Patient started on amphotericin B and flucytosine 2/21.   CD4 count <35; VL 25,300 (2/23)   Scr stable at 1.03 today -will continue to monitor closely K 4.1 Mg 1.6 after 2 g replacement yesterday - 4g Mg replacement today  Antimicrobials: - amphotericin B 2/21-c  - flucytosine 2/21-c - Azithromycin 1,200 mg weekly for MAC prophylaxis   Plan: - continue amphotericin B and flucytosine for 2 weeks of induction (stop date 04/09/20 in place); adjustments per ID team  - Continue pre-fluid NS  750 mL and post fluid NS 750 mL  - Mg 4 g x1 - monitor Scr daily - Monitor CBC QD for signs of neutropenia    Dimple Nanas, PharmD PGY-1 Acute Care Pharmacy Resident Office: [phone] 04/08/2020 7:51 AM

## 2020-04-09 DIAGNOSIS — N179 Acute kidney failure, unspecified: Secondary | ICD-10-CM | POA: Diagnosis not present

## 2020-04-09 DIAGNOSIS — B451 Cerebral cryptococcosis: Secondary | ICD-10-CM | POA: Diagnosis not present

## 2020-04-09 DIAGNOSIS — R519 Headache, unspecified: Secondary | ICD-10-CM | POA: Diagnosis not present

## 2020-04-09 DIAGNOSIS — B20 Human immunodeficiency virus [HIV] disease: Secondary | ICD-10-CM | POA: Diagnosis not present

## 2020-04-09 DIAGNOSIS — H538 Other visual disturbances: Secondary | ICD-10-CM | POA: Insufficient documentation

## 2020-04-09 DIAGNOSIS — F1490 Cocaine use, unspecified, uncomplicated: Secondary | ICD-10-CM

## 2020-04-09 LAB — BASIC METABOLIC PANEL
Anion gap: 8 (ref 5–15)
BUN: 20 mg/dL (ref 8–23)
CO2: 20 mmol/L — ABNORMAL LOW (ref 22–32)
Calcium: 9 mg/dL (ref 8.9–10.3)
Chloride: 103 mmol/L (ref 98–111)
Creatinine, Ser: 1.21 mg/dL (ref 0.61–1.24)
GFR, Estimated: >60 mL/min (ref >60)
Glucose, Bld: 111 mg/dL — ABNORMAL HIGH (ref 70–99)
Potassium: 4.3 mmol/L (ref 3.5–5.1)
Sodium: 131 mmol/L — ABNORMAL LOW (ref 135–145)

## 2020-04-09 LAB — CBC
HCT: 33.4 % — ABNORMAL LOW (ref 39.0–52.0)
Hemoglobin: 11.9 g/dL — ABNORMAL LOW (ref 13.0–17.0)
MCH: 33.7 pg (ref 26.0–34.0)
MCHC: 35.6 g/dL (ref 30.0–36.0)
MCV: 94.6 fL (ref 80.0–100.0)
Platelets: 324 10*3/uL (ref 150–400)
RBC: 3.53 MIL/uL — ABNORMAL LOW (ref 4.22–5.81)
RDW: 11.4 % — ABNORMAL LOW (ref 11.5–15.5)
WBC: 5.7 10*3/uL (ref 4.0–10.5)
nRBC: 0 % (ref 0.0–0.2)

## 2020-04-09 LAB — MAGNESIUM: Magnesium: 2.1 mg/dL (ref 1.7–2.4)

## 2020-04-09 MED ORDER — TROPICAMIDE 1 % OP SOLN
1.0000 [drp] | Freq: Once | OPHTHALMIC | Status: AC
  Administered 2020-04-09: 1 [drp] via OPHTHALMIC
  Filled 2020-04-09: qty 15

## 2020-04-09 MED ORDER — PHENYLEPHRINE HCL 2.5 % OP SOLN
1.0000 [drp] | Freq: Once | OPHTHALMIC | Status: AC
  Administered 2020-04-09: 1 [drp] via OPHTHALMIC
  Filled 2020-04-09: qty 2

## 2020-04-09 MED ORDER — TROPICAMIDE 0.5 % OP SOLN
1.0000 [drp] | Freq: Once | OPHTHALMIC | Status: DC
  Filled 2020-04-09: qty 0.1

## 2020-04-09 NOTE — Progress Notes (Addendum)
PROGRESS NOTE        PATIENT DETAILS Name: Ricky [name] Age: 65 y.o. Sex: male Date of Birth: November 09, 1955 Admit Date: 03/26/2020 Admitting Physician Reubin Milan, MD PCP:Pcp, No  Brief Narrative: Patient is a 65 y.o. 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 2/22>> CT chest with contrast: Nodular/cavitary airspace consolidation in the left upper/lower lobes 2/22>> Echo: EF 50-55% 2/24>> CT head: No acute intracranial process 2/24>> nuclear stress test: No ischemia  Antimicrobial therapy: Amphotericin/flucytosine: 2/21>>  Microbiology data: 2/21>> CSF culture: Cryptococcus neoformans 2/23>> sputum AFB smear negative 2/24>> sputum AFB smear: Negative 2/24>> CSF culture: Cryptococcus neoformans 2/28>> CSF culture: No growth  Procedures : LP>> 2/22, 2/24, 2/28  Consults: Neurology, cardiology Terri Skains), ID  DVT Prophylaxis : SCDs Start: 03/26/20 1946   Subjective: Sitting at bedside-having breakfast-no headache.  No fever.  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  HIV/AIDS: Noncompliant-timing for initiation of ART defer to ID.  SVT: Continue telemetry monitoring-on Cardizem-now with transient second-degree heart  block-await cardiology eval.  Nuclear stress test negative-echo with preserved EF.  TSH within normal limit  Recent COVID-19 infection: Asymptomatic-out of quarantine window.   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 Yes; Fluid consistency: Thin  Diet effective now                  Code Status: Full code  Family Communication: Will reach out to family over the next few days.  Disposition Plan: Status is: Inpatient  Remains inpatient appropriate because:Inpatient level of care appropriate due to severity of illness   Dispo:  Patient From: Home  Planned Disposition: Home  Medically stable for discharge: No     Barriers to Discharge: Cryptococcal meningitis-induction therapy with IV amphotericin B.   Antimicrobial agents: Anti-infectives (From admission, onward)   Start     Dose/Rate Route Frequency Ordered Stop   03/28/20 1000  azithromycin (ZITHROMAX) tablet 1,200 mg        1,200 mg Oral Weekly 03/28/20 0912     03/28/20 1000  sulfamethoxazole-trimethoprim (BACTRIM) 400-80 MG per tablet 1 tablet  Status:  Discontinued        1 tablet Oral Daily 03/28/20 0912 04/02/20 0919   03/27/20 2115  amphotericin B liposome (AMBISOME) 300 mg in dextrose 5 % 500 mL IVPB  Status:  Discontinued        300 mg 287.5 mL/hr over 120 Minutes Intravenous Every 24 hours 03/26/20 2024 03/26/20 2227   03/26/20 2227  amphotericin B liposome (AMBISOME) 300 mg in dextrose 5 % 500 mL IVPB  300 mg 287.5 mL/hr over 120 Minutes Intravenous Every 24 hours 03/26/20 2227 04/09/20 2359   03/26/20 1945  amphotericin B liposome (AMBISOME) 300 mg in dextrose 5 % 500 mL IVPB  Status:  Discontinued        300 mg 287.5 mL/hr over 120 Minutes  Intravenous Every 24 hours 03/26/20 1850 03/26/20 2024   03/26/20 1900  flucytosine (ANCOBON) capsule 2,000 mg        25 mg/kg  80 kg Oral Every 6 hours 03/26/20 1850 04/09/20 2359       Time spent: 15- minutes-Greater than 50% of this time was spent in counseling, explanation of diagnosis, planning of further management, and coordination of care.  MEDICATIONS: Scheduled Meds: . azithromycin  1,200 mg Oral Weekly  . dextromethorphan  30 mg Oral BID  . dextrose  10 mL Intravenous Q24H  . dextrose  10 mL Intravenous Q24H  . diltiazem  180 mg Oral Daily  . flucytosine  25 mg/kg Oral Q6H  . pravastatin  20 mg Oral QPM  . sodium chloride  750 mL Intravenous Q24H  . sodium chloride  750 mL Intravenous Q24H   Continuous Infusions: . amphotericin  B  Liposome (AMBISOME) ADULT IV Stopped (04/08/20 2132)   PRN Meds:.acetaminophen **OR** acetaminophen, acetaminophen, diphenhydrAMINE **OR** diphenhydrAMINE, ibuprofen, ondansetron **OR** ondansetron (ZOFRAN) IV, oxyCODONE   PHYSICAL EXAM: Vital signs: Vitals:   04/08/20 1955 04/09/20 0015 04/09/20 0413 04/09/20 0754  BP: 108/79 128/85 119/76 136/82  Pulse: 62 (!) 59 62 71  Resp: 14 16 15 16   Temp: 97.8 F (36.6 C) 98.2 F (36.8 C) 98.3 F (36.8 C) 98 F (36.7 C)  TempSrc: Oral Oral Oral Oral  SpO2: 100% 100% 98% 98%  Weight:      Height:       Filed Weights   03/26/20 1839 03/27/20 0141  Weight: 80 kg 80.8 kg   Body mass index is 23.5 kg/m.   Gen Exam:Alert awake-not in any distress HEENT:atraumatic, normocephalic Chest: B/L clear to auscultation anteriorly CVS:S1S2 regular Abdomen:soft non tender, non distended Extremities:no edema Neurology: Non focal Skin: no rash  I have personally reviewed following labs and imaging studies  LABORATORY DATA: CBC: Recent Labs  Lab 04/05/20 0407 04/06/20 0457 04/07/20 0301 04/08/20 0235 04/09/20 0343  WBC 4.8 4.0 4.4 5.2 5.7  HGB 12.0* 12.2* 12.9* 12.4* 11.9*  HCT  34.3* 35.9* 36.4* 35.8* 33.4*  MCV 96.9 96.5 95.5 96.0 94.6  PLT 367 374 380 352 001    Basic Metabolic Panel: Recent Labs  Lab 04/05/20 0407 04/06/20 0457 04/07/20 0301 04/08/20 0235 04/09/20 0343  NA 132* 133* 133* 133* 131*  K 4.2 4.3 4.3 4.1 4.3  CL 104 107 104 102 103  CO2 20* 18* 23 21* 20*  GLUCOSE 168* 107* 126* 112* 111*  BUN 15 14 12 16 20   CREATININE 1.23 1.11 1.12 1.03 1.21  CALCIUM 8.8* 9.0 9.2 9.3 9.0  MG 1.6* 1.8 1.8 1.6* 2.1    GFR: Estimated Creatinine Clearance: 71.5 mL/min (by C-G formula based on SCr of 1.21 mg/dL).  Liver Function Tests: No results for input(s): AST, ALT, ALKPHOS, BILITOT, PROT, ALBUMIN in the last 168 hours. No results for input(s): LIPASE, AMYLASE in the last 168 hours. No results for input(s): AMMONIA in the last 168 hours.  Coagulation Profile: No results for input(s): INR, PROTIME in the last 168 hours.  Cardiac Enzymes: No results for input(s): CKTOTAL, CKMB, CKMBINDEX, TROPONINI in the last 168 hours.  BNP (  last 3 results) No results for input(s): PROBNP in the last 8760 hours.  Lipid Profile: No results for input(s): CHOL, HDL, LDLCALC, TRIG, CHOLHDL, LDLDIRECT in the last 72 hours.  Thyroid Function Tests: No results for input(s): TSH, T4TOTAL, FREET4, T3FREE, THYROIDAB in the last 72 hours.  Anemia Panel: No results for input(s): VITAMINB12, FOLATE, FERRITIN, TIBC, IRON, RETICCTPCT in the last 72 hours.  Urine analysis: No results found for: COLORURINE, APPEARANCEUR, LABSPEC, PHURINE, GLUCOSEU, HGBUR, BILIRUBINUR, KETONESUR, PROTEINUR, UROBILINOGEN, NITRITE, LEUKOCYTESUR  Sepsis Labs: Lactic Acid, Venous    Component Value Date/Time   LATICACIDVEN 0.9 03/26/2020 0958    MICROBIOLOGY: Recent Results (from the past 240 hour(s))  Expectorated Sputum Assessment w Gram Stain, Rflx to Resp Cult     Status: None   Collection Time: 03/30/20 10:50 AM   Specimen: Expectorated Sputum  Result Value Ref Range Status    Specimen Description EXPECTORATED SPUTUM  Final   Special Requests NONE  Final   Sputum evaluation   Final    Sputum specimen not acceptable for testing.  Please recollect.   RESULT CALLED TO, READ BACK BY AND VERIFIED WITH: ORODOSA RN 03/31/20 0539 JDW Performed at [facility] [address], [place], [place] [address]    Report Status 03/31/2020 FINAL  Final  Expectorated Sputum Assessment w Gram Stain, Rflx to Resp Cult     Status: None   Collection Time: 03/31/20  9:12 AM   Specimen: Expectorated Sputum  Result Value Ref Range Status   Specimen Description EXPECTORATED SPUTUM  Final   Special Requests Immunocompromised  Final   Sputum evaluation   Final    Sputum specimen not acceptable for testing.  Please recollect.   Gram Stain Report Called to,Read Back By and Verified With: RN A LEWIS AT 1717 03/31/2020 BY L BENFIELD Performed at [facility], [place] [address] [place], [place] [address]    Report Status 03/31/2020 FINAL  Final  CSF culture w Gram Stain     Status: None   Collection Time: 04/02/20  3:00 PM   Specimen: CSF  Result Value Ref Range Status   Specimen Description CSF  Final   Special Requests NONE  Final   Gram Stain   Final    WBC PRESENT, PREDOMINANTLY MONONUCLEAR NO ORGANISMS SEEN CYTOSPIN SMEAR    Culture   Final    NO GROWTH 3 DAYS Performed at [facility], [place] [address] [place], [place] [address]    Report Status 04/05/2020 FINAL  Final  Blastomyces Antigen     Status: None   Collection Time: 04/03/20  4:42 AM   Specimen: Blood  Result Value Ref Range Status   Blastomyces Antigen None Detected None Detected ng/mL Final    Comment: (NOTE) Results reported as ng/mL in 0.2 - 14.7 ng/mL range Results above the limit of detection but below 0.2 ng/mL are reported as 'Positive, Below the Limit of Quantification' Results above 14.7 ng/mL are reported as 'Positive, Above the Limit of Quantification'    Specimen Type SERUM  Final     Comment: (NOTE) Performed At: [facility] [place], [place] [id number] Bruce Donath MD OZ:[id number]     RADIOLOGY STUDIES/RESULTS: No results found.   LOS: 14 days   Oren Binet, MD  [facility]    To contact the attending provider between 7A-7P or the covering provider during after hours 7P-7A, please log into the web site [email or web address] and access using  universal [place] password for that web site. If you do not have the password, please call the hospital operator.  04/09/2020, 10:40 AM

## 2020-04-09 NOTE — Consult Note (Signed)
OPHTHALMOLOGY CONSULT NOTE  Date: 04/09/20 Time: 4:48 PM  Patient Name: Ricky [name]  DOB: 1955-08-09 MRN: [id number]  Reason for Consult: Blurred vision  HPI:  This is a 65 y.o. male with HIV admitted for cryptococcal meningitis for whom the primary team consulted for 2-3 weeks worth of blurred vision. The patient states that his vision has been blurrier since he developed a headache but does not note any dramatic changes. The patient endorses a lazy eye on the right with best vision of 20/60. Infectious disease recommended funduscopic examination.   Prior to Admission medications   Medication Sig Start Date End Date Taking? Authorizing Provider  amoxicillin-clavulanate (AUGMENTIN) 875-125 MG tablet Take 1 tablet by mouth 2 (two) times daily. One po bid x 7 days 03/21/20  Yes Domenic Moras, PA-C  benzonatate (TESSALON) 100 MG capsule Take 1 capsule (100 mg total) by mouth every 8 (eight) hours. 03/21/20  Yes Domenic Moras, PA-C    Past Medical History:  Diagnosis Date  . Human immunodeficiency virus (HIV) disease ([place]) 03/26/2020    family history includes Hypertension in his brother, mother, and sister; Kidney disease in his mother.  Social History   Occupational History  . Not on file  Tobacco Use  . Smoking status: Current Every Day Smoker    Types: Cigarettes  . Smokeless tobacco: Not on file  Substance and Sexual Activity  . Alcohol use: Yes    Alcohol/week: 14.0 - 21.0 standard drinks    Types: 14 - 21 Cans of beer per week  . Drug use: Not Currently  . Sexual activity: Not on file    No Known Allergies  ROS: Positive as above, otherwise negative.  EXAM:  Mental Status: A&O x 3   Base Exam: Right Eye Left Eye  Visual Acuity (At near) 20/100 20/80  IOP ([place]open) 13 15  Pupillary Exam Dilated   Motility Full  Full   Confrontation VF Full  Full    Anterior Segment Exam    Lids/Lashes WNL WNL  Conjuctiva White and Quiet, ping ou White and Quiet  Cornea Clear Clear   Anterior Chamber Deep and Quiet Deep and Quiet  Iris Round Round  Lens NSC NSC  Vitreous WNL WNL   Poster Segment Exam    Disc Sharp, no edema or pallor No edema or pallor  CD ratio 0.3 0.3  Macula Flat Flat  Vessels WNL WNL  Periphery Attached, no retinitis ou Attached   Radiographic Studies Reviewed: None  Assessment and Recommendation: 1. HIV with cryptococcal meningitis: No signs of nerve pallor or edema at this point.  Patient given office information with instructions to follow up for visual field testing and full examination. With regards to the patients HIV, there are no signs of opportunistic infection at this time. The cause of the patients blurred vision is unable to be assessed at the bedside but I suspect it is a combination of refractive error and mild cataract formation.  Given that discharge date is unclear, primary team should call office to set up follow up appointment at discharge.   Please call with any questions.  Jola Schmidt MD [facility] Ophthalmology [phone]

## 2020-04-09 NOTE — Progress Notes (Signed)
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 Patient started on amphotericin B and flucytosine 2/21.   CD4 count <35; VL 25,300 (2/23)   Scr slightly up from 1.03 to 1.21 today -will continue to monitor closely K 4.3 Mg 2.1 after 4 g replacement yesterday  Antimicrobials: - amphotericin B 2/21-c  - flucytosine 2/21-c - Azithromycin 1,200 mg weekly for MAC prophylaxis   Plan: - continue amphotericin B and flucytosine for 2 weeks of induction (stop date 04/09/20 in place); adjustments per ID team  - Considering transitioning to fluconazole tomorrow vs getting another LP to ensure sterilization of CSF - Continue pre-fluid NS  750 mL and post fluid NS 750 mL  - monitor Scr daily - Monitor CBC QD for signs of neutropenia    Nicoletta Dress, PharmD, BCIDP Infectious Disease Pharmacist  Phone: [phone] 04/09/2020 4:02 PM

## 2020-04-09 NOTE — Progress Notes (Signed)
Subjective:  Still having blurry vision in his right eye.  Antibiotics:  Anti-infectives (From admission, onward)   Start     Dose/Rate Route Frequency Ordered Stop   03/28/20 1000  azithromycin (ZITHROMAX) tablet 1,200 mg        1,200 mg Oral Weekly 03/28/20 0912     03/28/20 1000  sulfamethoxazole-trimethoprim (BACTRIM) 400-80 MG per tablet 1 tablet  Status:  Discontinued        1 tablet Oral Daily 03/28/20 0912 04/02/20 0919   03/27/20 2115  amphotericin B liposome (AMBISOME) 300 mg in dextrose 5 % 500 mL IVPB  Status:  Discontinued        300 mg 287.5 mL/hr over 120 Minutes Intravenous Every 24 hours 03/26/20 2024 03/26/20 2227   03/26/20 2227  amphotericin B liposome (AMBISOME) 300 mg in dextrose 5 % 500 mL IVPB        300 mg 287.5 mL/hr over 120 Minutes Intravenous Every 24 hours 03/26/20 2227 04/09/20 2359   03/26/20 1945  amphotericin B liposome (AMBISOME) 300 mg in dextrose 5 % 500 mL IVPB  Status:  Discontinued        300 mg 287.5 mL/hr over 120 Minutes Intravenous Every 24 hours 03/26/20 1850 03/26/20 2024   03/26/20 1900  flucytosine (ANCOBON) capsule 2,000 mg        25 mg/kg  80 kg Oral Every 6 hours 03/26/20 1850 04/09/20 2359      Medications: Scheduled Meds: . azithromycin  1,200 mg Oral Weekly  . dextromethorphan  30 mg Oral BID  . dextrose  10 mL Intravenous Q24H  . dextrose  10 mL Intravenous Q24H  . diltiazem  180 mg Oral Daily  . flucytosine  25 mg/kg Oral Q6H  . pravastatin  20 mg Oral QPM  . sodium chloride  750 mL Intravenous Q24H  . sodium chloride  750 mL Intravenous Q24H   Continuous Infusions: . amphotericin  B  Liposome (AMBISOME) ADULT IV Stopped (04/08/20 2132)   PRN Meds:.acetaminophen **OR** acetaminophen, acetaminophen, diphenhydrAMINE **OR** diphenhydrAMINE, ibuprofen, ondansetron **OR** ondansetron (ZOFRAN) IV, oxyCODONE    Objective: Weight change:   Intake/Output Summary (Last 24 hours) at 04/09/2020 1233 Last data  filed at 04/09/2020 0600 Gross per 24 hour  Intake 4045.17 ml  Output --  Net 4045.17 ml   Blood pressure 113/72, pulse 60, temperature 97.8 F (36.6 C), temperature source Oral, resp. rate 16, height 6\' 1"  (1.854 m), weight 80.8 kg, SpO2 99 %. Temp:  [97.4 F (36.3 C)-98.3 F (36.8 C)] 97.8 F (36.6 C) (03/07 1136) Pulse Rate:  [53-71] 60 (03/07 1136) Resp:  [14-18] 16 (03/07 1136) BP: (108-136)/(71-85) 113/72 (03/07 1136) SpO2:  [98 %-100 %] 99 % (03/07 1136)  Physical Exam: Physical Exam Constitutional:      Appearance: He is well-developed.  HENT:     Head: Normocephalic and atraumatic.  Eyes:     Conjunctiva/sclera: Conjunctivae normal.  Cardiovascular:     Rate and Rhythm: Normal rate and regular rhythm.  Pulmonary:     Effort: Pulmonary effort is normal. No respiratory distress.     Breath sounds: No wheezing.  Abdominal:     General: There is no distension.     Palpations: Abdomen is soft.  Musculoskeletal:        General: Normal range of motion.     Cervical back: Normal range of motion and neck supple.  Skin:    General: Skin is warm and dry.  Findings: No erythema or rash.  Neurological:     Mental Status: He is alert and oriented to person, place, and time.  Psychiatric:        Mood and Affect: Mood normal. Mood is not anxious or depressed.        Speech: Speech normal.        Behavior: Behavior normal. Behavior is not agitated.        Thought Content: Thought content normal.        Cognition and Memory: Cognition is not impaired. Memory is not impaired.        Judgment: Judgment normal.     BV right eye CBC:    BMET Recent Labs    04/08/20 0235 04/09/20 0343  NA 133* 131*  K 4.1 4.3  CL 102 103  CO2 21* 20*  GLUCOSE 112* 111*  BUN 16 20  CREATININE 1.03 1.21  CALCIUM 9.3 9.0     Liver Panel  No results for input(s): PROT, ALBUMIN, AST, ALT, ALKPHOS, BILITOT, BILIDIR, IBILI in the last 72 hours.     Sedimentation Rate No  results for input(s): ESRSEDRATE in the last 72 hours. C-Reactive Protein No results for input(s): CRP in the last 72 hours.  Micro Results: Recent Results (from the past 720 hour(s))  CSF culture     Status: Abnormal   Collection Time: 03/26/20  5:05 PM   Specimen: CSF; Cerebrospinal Fluid  Result Value Ref Range Status   Specimen Description CSF  Final   Special Requests NONE  Final   Gram Stain   Final    WBC PRESENT,BOTH PMN AND MONONUCLEAR CRYPTOCOCCUS NEOFORMANS CYTOSPIN SMEAR Performed at [name], [name] [name], [name]    Culture CRYPTOCOCCUS NEOFORMANS (A)  Final   Report Status 03/29/2020 FINAL  Final  Fungus Culture With Stain     Status: Abnormal   Collection Time: 03/26/20  5:05 PM   Specimen: Lumbar Puncture; Cerebrospinal Fluid  Result Value Ref Range Status   Fungus Stain Final report  Final   Fungus (Mycology) Culture Preliminary report (A)  Final    Comment: (NOTE) Called prelim result of yeast isolated to Camelia on 04-06-20 at 0950. Faxed to [phone]. LL Performed At: [name], [name]    Fungal Source CSF  Final    Comment: Performed at [name], [name] [name], [name]  Fungus Culture Result     Status: None   Collection Time: 03/26/20  5:05 PM  Result Value Ref Range Status   Result 1 Comment  Final    Comment: (NOTE) KOH/Calcofluor preparation:  no fungus observed. Performed At: [name], [name]   Fungal organism reflex     Status: Abnormal   Collection Time: 03/26/20  5:05 PM  Result Value Ref Range Status   Fungal result 1 Comment (A)  Final    Comment: (NOTE) Yeast isolated, identification in progress. Performed At: [name], [name]    Expectorated Sputum Assessment w Gram Stain, Rflx to Resp Cult     Status: None   Collection Time: 03/27/20  8:23 PM   Specimen: Sputum  Result Value Ref Range Status   Specimen Description SPUTUM  Final   Special Requests NONE  Final   Sputum evaluation   Final  Sputum specimen not acceptable for testing.  Please recollect.   RESULT CALLED TO, READ BACK BY AND VERIFIED WITH: Radene Knee RN 03/27/20 2225 JDW Performed at [facility], [place] [address] [place], [place] [address]    Report Status 03/27/2020 FINAL  Final  Expectorated Sputum Assessment w Gram Stain, Rflx to Resp Cult     Status: None   Collection Time: 03/28/20  1:45 AM   Specimen: Sputum  Result Value Ref Range Status   Specimen Description SPUTUM  Final   Special Requests NONE  Final   Sputum evaluation   Final    Sputum specimen not acceptable for testing.  Please recollect.   RESULT CALLED TO, READ BACK BY AND VERIFIED WITH: P ASIDI RN 03/27/20 0354 JDW Performed at [facility], [place] [address], [place], [place] [address]    Report Status 03/28/2020 FINAL  Final  Acid Fast Smear (AFB)     Status: None   Collection Time: 03/28/20 12:00 PM   Specimen: Sputum  Result Value Ref Range Status   AFB Specimen Processing Concentration  Final   Acid Fast Smear Negative  Final    Comment: (NOTE) Performed At: [facility] [address] [place], [place] [id number] [name] MD PT:[id number]    Source (AFB) SPU  Final    Comment: Performed at [facility], [place] [address] [place], [place] [address]  Fungus Culture With Stain     Status: Abnormal (Preliminary result)   Collection Time: 03/28/20 12:00 PM   Specimen: Sputum  Result Value Ref Range Status   Fungus Stain Final report (A)  Final    Comment: (NOTE) Performed At: [facility] [address] [place], [place] [id number] [name] MD FM:[id number]    Fungus (Mycology) Culture PENDING  Incomplete   Fungal Source SPU   Final    Comment: Performed at [facility], [place] [address] [place], [place] [address]  Fungus Culture Result     Status: Abnormal   Collection Time: 03/28/20 12:00 PM  Result Value Ref Range Status   Result 1 Comment (A)  Final    Comment: (NOTE) Fungal elements, such as arthroconidia, hyphal fragments, chlamydoconidia, observed. Performed At: [facility] [place], [place] [id number] [name] MD ZR:[id number]   Acid Fast Smear (AFB)     Status: None   Collection Time: 03/29/20  1:56 PM   Specimen: Sputum  Result Value Ref Range Status   AFB Specimen Processing Concentration  Final   Acid Fast Smear Negative  Final    Comment: (NOTE) Performed At: [facility] [place], [place] [id number] [name] MD LH:[id number]    Source (AFB) SPUTUM  Final    Comment: Performed at [facility], [place] [address] [place], [place] [address]  Fungus Culture With Stain     Status: None (Preliminary result)   Collection Time: 03/29/20  1:56 PM  Result Value Ref Range Status   Fungus Stain Final report  Final    Comment: (NOTE) Performed At: [facility] [place], [place] [id number] [name] MD [id number]    Fungus (Mycology) Culture PENDING  Incomplete   Fungal Source SPUTUM  Final    Comment: Performed at [facility], [place] [address], [place], [place] [address]  Fungus Culture Result     Status: None   Collection Time: 03/29/20  1:56 PM  Result Value Ref Range Status  Result 1 Comment  Final    Comment: (NOTE) KOH/Calcofluor preparation:  no fungus observed. Performed At: [facility] [address] [place], [place] [id number] [name] MD ID:[id number]   Fungus Culture With Stain     Status: None (Preliminary result)   Collection Time: 03/29/20  4:24 PM  Result Value Ref Range Status   Fungus Stain Final report  Final    Comment: (NOTE) Performed At: [facility] [address] [place], [place] [id number] [name] MD PJ:[id number]    Fungus (Mycology) Culture PENDING  Incomplete   Fungal Source CSF  Final    Comment: Performed at [facility], [place] [address] [place], [place] [address]  CSF culture w Gram Stain     Status: Abnormal   Collection Time: 03/29/20  4:24 PM   Specimen: CSF; Cerebrospinal Fluid  Result Value Ref Range Status   Specimen Description CSF  Final   Special Requests SAMPLE IN FRIDGE PRIOR TO ADDING ON CSF CULTURE  Final   Gram Stain   Final    WBC PRESENT,BOTH PMN AND MONONUCLEAR CYTOSPIN SMEAR CORRECTED RESULTS NO ORGANISMS SEEN PREVIOUSLY REPORTED AS: CRYPTOCOCCUS NEOFORMANS CORRECTED RESULTS CALLED TO: RN SONYA B 3 [id number] FCP    Culture (A)  Final    CRYPTOCOCCUS NEOFORMANS CRITICAL VALUE NOTED.  VALUE IS CONSISTENT WITH PREVIOUSLY REPORTED AND CALLED VALUE. Performed at [facility], [place] [address], [place], [place] [address]    Report Status 04/04/2020 FINAL  Final  Fungus Culture Result     Status: None   Collection Time: 03/29/20  4:24 PM  Result Value Ref Range Status   Result 1 Comment  Final    Comment: (NOTE) KOH/Calcofluor preparation:  no fungus observed. Performed At: [facility] [place], [place] [id number] [name] MD LP:[id number]   Expectorated Sputum Assessment w Gram Stain, Rflx to Resp Cult     Status: None   Collection Time: 03/30/20 10:50 AM   Specimen: Expectorated Sputum  Result Value Ref Range Status   Specimen Description EXPECTORATED SPUTUM  Final   Special Requests NONE  Final   Sputum evaluation   Final    Sputum specimen not acceptable for testing.  Please recollect.   RESULT CALLED TO, READ BACK BY AND VERIFIED WITH: ORODOSA RN 03/31/20 0539 JDW Performed at [place] [address] [place], [place] [address]    Report Status 03/31/2020 FINAL  Final  Expectorated Sputum Assessment w Gram Stain, Rflx to Resp  Cult     Status: None   Collection Time: 03/31/20  9:12 AM   Specimen: Expectorated Sputum  Result Value Ref Range Status   Specimen Description EXPECTORATED SPUTUM  Final   Special Requests Immunocompromised  Final   Sputum evaluation   Final    Sputum specimen not acceptable for testing.  Please recollect.   Gram Stain Report Called to,Read Back By and Verified With: RN A LEWIS AT 1717 03/31/2020 BY L BENFIELD Performed at [facility], [place] [address] [place],  [address]    Report Status 03/31/2020 FINAL  Final  CSF culture w Gram Stain     Status: None   Collection Time: 04/02/20  3:00 PM   Specimen: CSF  Result Value Ref Range Status   Specimen Description CSF  Final   Special Requests NONE  Final   Gram Stain   Final    WBC PRESENT, PREDOMINANTLY MONONUCLEAR NO ORGANISMS SEEN CYTOSPIN  SMEAR    Culture   Final    NO GROWTH 3 DAYS Performed at [facility], [place] [address], [place], [place] [address]    Report Status 04/05/2020 FINAL  Final  Blastomyces Antigen     Status: None   Collection Time: 04/03/20  4:42 AM   Specimen: Blood  Result Value Ref Range Status   Blastomyces Antigen None Detected None Detected ng/mL Final    Comment: (NOTE) Results reported as ng/mL in 0.2 - 14.7 ng/mL range Results above the limit of detection but below 0.2 ng/mL are reported as 'Positive, Below the Limit of Quantification' Results above 14.7 ng/mL are reported as 'Positive, Above the Limit of Quantification'    Specimen Type SERUM  Final    Comment: (NOTE) Performed At: [facility] [place], [place] [id number] Bruce Donath MD ZR:[id number]     Studies/Results: No results found.    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  He has a history of prior tuberculosis that was treated.  He has acute kidney injury the context of amphotericin.  1.  Cryptococcal meningitis:  He had high intracranial pressures on the first 2 lumbar punctures Dr. Leonel Ramsay pointed out that his pressure was normal at 14 cm of water 4 days after his second lumbar puncture.  Last week I was under the impression that his visual field abnormalities had gone but he still has blurry vision on the right.  His headaches have improved and are nearly gone today.  I think it would be prudent to ask Neurology for repeat LP to ensure opening pressure still normal and to send CSF for cultures to ensure his CSF is sterile post "induction"  While he already sterilize his most recent culture that was taken on lumbar puncture the persistent blurry vision bothers me be nice to be reassured that he has normal intracranial pressures.  Want to defer his antiretroviral therapy to at least 5 weeks post treatment of his cryptococcal meningitis.  #2 blurry vision with thinking this was related to his cryptococcal disease but perhaps he has a different opportunistic infection on top of his cryptococcal infection such as ocular toxoplasmosis versus CMV HSV or VZV infection.  PLEASE CONSULT OPHTHALMOLOGY FOR FORMAL DILATED FUNDOSCOPIC EXAM  I spent greater than 35 minutes with the patient including greater than 50% of time in face to face counsel of the patient and in coordination of his  care.   LOS: 14 days   Ricky [name] 04/09/2020, 12:33 PM

## 2020-04-10 DIAGNOSIS — A310 Pulmonary mycobacterial infection: Secondary | ICD-10-CM | POA: Diagnosis not present

## 2020-04-10 DIAGNOSIS — A312 Disseminated mycobacterium avium-intracellulare complex (DMAC): Secondary | ICD-10-CM

## 2020-04-10 DIAGNOSIS — B451 Cerebral cryptococcosis: Secondary | ICD-10-CM | POA: Diagnosis not present

## 2020-04-10 DIAGNOSIS — J984 Other disorders of lung: Secondary | ICD-10-CM | POA: Diagnosis not present

## 2020-04-10 DIAGNOSIS — B20 Human immunodeficiency virus [HIV] disease: Secondary | ICD-10-CM | POA: Diagnosis not present

## 2020-04-10 LAB — BASIC METABOLIC PANEL
Anion gap: 8 (ref 5–15)
BUN: 13 mg/dL (ref 8–23)
CO2: 21 mmol/L — ABNORMAL LOW (ref 22–32)
Calcium: 9.1 mg/dL (ref 8.9–10.3)
Chloride: 103 mmol/L (ref 98–111)
Creatinine, Ser: 1.04 mg/dL (ref 0.61–1.24)
GFR, Estimated: >60 mL/min (ref >60)
Glucose, Bld: 163 mg/dL — ABNORMAL HIGH (ref 70–99)
Potassium: 4.1 mmol/L (ref 3.5–5.1)
Sodium: 132 mmol/L — ABNORMAL LOW (ref 135–145)

## 2020-04-10 LAB — CBC
HCT: 32.6 % — ABNORMAL LOW (ref 39.0–52.0)
Hemoglobin: 11.4 g/dL — ABNORMAL LOW (ref 13.0–17.0)
MCH: 33.4 pg (ref 26.0–34.0)
MCHC: 35 g/dL (ref 30.0–36.0)
MCV: 95.6 fL (ref 80.0–100.0)
Platelets: 307 10*3/uL (ref 150–400)
RBC: 3.41 MIL/uL — ABNORMAL LOW (ref 4.22–5.81)
RDW: 11.4 % — ABNORMAL LOW (ref 11.5–15.5)
WBC: 4.8 10*3/uL (ref 4.0–10.5)
nRBC: 0 % (ref 0.0–0.2)

## 2020-04-10 LAB — MAGNESIUM: Magnesium: 1.7 mg/dL (ref 1.7–2.4)

## 2020-04-10 MED ORDER — MAGNESIUM SULFATE 4 GM/100ML IV SOLN
4.0000 g | Freq: Once | INTRAVENOUS | Status: AC
  Administered 2020-04-10: 4 g via INTRAVENOUS
  Filled 2020-04-10: qty 100

## 2020-04-10 MED ORDER — RIFABUTIN 150 MG PO CAPS
300.0000 mg | ORAL_CAPSULE | Freq: Every day | ORAL | Status: DC
  Administered 2020-04-10 – 2020-04-25 (×16): 300 mg via ORAL
  Filled 2020-04-10 (×16): qty 2

## 2020-04-10 MED ORDER — AZITHROMYCIN 250 MG PO TABS
500.0000 mg | ORAL_TABLET | Freq: Every day | ORAL | Status: DC
  Administered 2020-04-11 – 2020-04-25 (×14): 500 mg via ORAL
  Filled 2020-04-10 (×3): qty 1
  Filled 2020-04-10: qty 2
  Filled 2020-04-10 (×10): qty 1
  Filled 2020-04-10: qty 2

## 2020-04-10 MED ORDER — ETHAMBUTOL HCL 400 MG PO TABS
1200.0000 mg | ORAL_TABLET | Freq: Every day | ORAL | Status: DC
  Administered 2020-04-10 – 2020-04-25 (×16): 1200 mg via ORAL
  Filled 2020-04-10 (×16): qty 3

## 2020-04-10 NOTE — Progress Notes (Signed)
PROGRESS NOTE        PATIENT DETAILS Name: Ricky [name] Age: 65 y.o. Sex: male Date of Birth: 03/24/1955 Admit Date: 03/26/2020 Admitting Physician Reubin Milan, MD PCP:Pcp, No  Brief Narrative: Patient is a 65 y.o. 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 2/22>> CT chest with contrast: Nodular/cavitary airspace consolidation in the left upper/lower lobes 2/22>> Echo: EF 50-55% 2/24>> CT head: No acute intracranial process 2/24>> nuclear stress test: No ischemia  Antimicrobial therapy: Amphotericin/flucytosine: 2/21>> Ethambutol: 3/8>> Rifabutin: 3/8>> Zithromax: 3/8>>  Microbiology data: 2/21>> CSF culture: Cryptococcus neoformans 2/23>> sputum AFB smear negative 2/23>> sputum AFB culture positive 2/24>> sputum AFB smear: Negative 2/24>> CSF culture: Cryptococcus neoformans 2/28>> CSF culture: No growth  Procedures : LP>> 2/22, 2/24, 2/28  Consults: Neurology, cardiology Terri Skains), ID, ophthalmology  DVT Prophylaxis : SCDs Start: 03/26/20 1946   Subjective: Lying comfortably in bed-no major issues overnight.  Assessment/Plan: Cryptococcal meningitis: Overall improved-no headache-has chronic right eye blurriness-remains on IV amphotericin B/flucytosine-ID/neurology following-with plans for repeat LP later this week.    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  Histoplasma/blastomycosis antigen negative.   HIV/AIDS: Noncompliant-timing for initiation of ART defer to  ID.  SVT: Continue telemetry monitoring-on Cardizem-now with transient second-degree heart block-await cardiology eval.  Nuclear stress test negative-echo with preserved EF.  TSH within normal limit  Recent COVID-19 infection: Asymptomatic-out of quarantine window.  Chronic right eye blurriness: Evaluated by ophthalmology on 3/7-no evidence of infection.  History of tuberculosis-apparently treated at the [facility] and April 2001   Lab Results  Component Value Date   [place] (A) 03/03/2020    Chronic calcific pancreatitis: Seen incidentally on CT chest  Moderate protein calorie malnutrition  History of cocaine use  Tobacco abuse   Diet: Diet Order            Diet Heart Room service appropriate? Yes; Fluid consistency: Thin  Diet effective now                  Code Status: Full code  Family Communication: Will reach out to family over the next few days.  Disposition Plan: Status is: Inpatient  Remains inpatient appropriate because:Inpatient level of care appropriate due to severity of illness   Dispo:  Patient From: Home  Planned Disposition: Home  Medically stable for discharge: No     Barriers to Discharge: Cryptococcal meningitis-induction therapy with IV amphotericin B.   Antimicrobial agents: Anti-infectives (From admission, onward)   Start     Dose/Rate Route Frequency Ordered Stop   04/11/20 1000  azithromycin (ZITHROMAX) tablet 500 mg        500 mg Oral Daily 04/10/20 1038     04/10/20 1130  ethambutol (MYAMBUTOL) tablet 1,200 mg        1,200 mg Oral Daily 04/10/20 1038     04/10/20 1130  rifabutin (MYCOBUTIN) capsule 300 mg        300 mg Oral Daily 04/10/20 1038     03/28/20 1000  azithromycin (ZITHROMAX) tablet 1,200 mg  Status:  Discontinued        1,200 mg Oral Weekly 03/28/20 0912 04/10/20 1038   03/28/20 1000  sulfamethoxazole-trimethoprim (BACTRIM) 400-80 MG per tablet 1 tablet  Status:  Discontinued         1 tablet Oral Daily 03/28/20 0912 04/02/20 0919   03/27/20 2115  amphotericin B liposome (AMBISOME) 300 mg in dextrose 5 % 500 mL IVPB  Status:  Discontinued        300 mg 287.5 mL/hr over 120 Minutes Intravenous Every 24 hours 03/26/20 2024 03/26/20 2227   03/26/20 2227  amphotericin B liposome (AMBISOME) 300 mg in dextrose 5 % 500 mL IVPB        300 mg 287.5 mL/hr over 120 Minutes Intravenous Every 24 hours 03/26/20 2227 04/10/20 2359   03/26/20 1945  amphotericin B liposome (AMBISOME) 300 mg in dextrose 5 % 500 mL IVPB  Status:  Discontinued        300 mg 287.5 mL/hr over 120 Minutes Intravenous Every 24 hours 03/26/20 1850 03/26/20 2024   03/26/20 1900  flucytosine (ANCOBON) capsule 2,000 mg        25 mg/kg  80 kg Oral Every 6 hours 03/26/20 1850 04/10/20 2359       Time spent: 15- minutes-Greater than 50% of this time was spent in counseling, explanation of diagnosis, planning of further management, and coordination of care.  MEDICATIONS: Scheduled Meds: . [START ON 04/11/2020] azithromycin  500 mg Oral Daily  . dextromethorphan  30 mg Oral BID  . dextrose  10 mL Intravenous Q24H  . dextrose  10 mL Intravenous Q24H  . diltiazem  180 mg Oral Daily  . ethambutol  1,200 mg Oral Daily  . flucytosine  25 mg/kg Oral Q6H  . pravastatin  20 mg Oral QPM  . rifabutin  300 mg Oral Daily  . sodium chloride  750 mL Intravenous Q24H  . sodium chloride  750 mL Intravenous Q24H   Continuous Infusions: . amphotericin  B  Liposome (AMBISOME) ADULT IV Stopped (04/09/20 2359)   PRN Meds:.acetaminophen **OR** acetaminophen, acetaminophen, diphenhydrAMINE **OR** diphenhydrAMINE, ibuprofen, ondansetron **OR** ondansetron (ZOFRAN) IV, oxyCODONE   PHYSICAL EXAM: Vital signs: Vitals:   04/09/20 2003 04/10/20 0004 04/10/20 0317 04/10/20 0845  BP: 133/83 128/84 138/85 115/78  Pulse: (!) 58 63 60 67  Resp: _0 Temp: 97.8 F (36.6 C) 97.8 F (36.6 C) 98.1 F (36.7 C) 98 F (36.7  C)  TempSrc: Oral Oral Oral Oral  SpO2: 100% 99% 99% 99%  Weight:      Height:       Filed Weights   03/26/20 1839 03/27/20 0141  Weight: 80 kg 80.8 kg   Body mass index is 23.5 kg/m.   Gen Exam:Alert awake-not in any distress HEENT:atraumatic, normocephalic Chest: B/L clear to auscultation anteriorly CVS:S1S2 regular Abdomen:soft non tender, non distended Extremities:no edema Neurology: Non focal Skin: no rash  I have personally reviewed following labs and imaging studies  LABORATORY DATA: CBC: Recent Labs  Lab 04/06/20 0457 04/07/20 0301 04/08/20 0235 04/09/20 0343 04/10/20 0352  WBC 4.0 4.4 5.2 5.7 4.8  HGB 12.2* 12.9* 12.4* 11.9* 11.4*  HCT 35.9* 36.4* 35.8* 33.4* 32.6*  MCV 96.5 95.5 96.0 94.6 95.6  PLT 374 380 352 324 161    Basic Metabolic Panel: Recent Labs  Lab 04/06/20 0457 04/07/20 0301 04/08/20 0235 04/09/20 0343 04/10/20 0352  NA 133* 133* 133* 131* 132*  K 4.3 4.3 4.1 4.3 4.1  CL 107 104 102 103 103  CO2 18* 23 21* 20* 21*  GLUCOSE 107* 126* 112* 111* 163*  BUN _0 CREATININE 1.11 1.12 1.03 1.21 1.04  CALCIUM 9.0 9.2 9.3 9.0 9.1  MG 1.8 1.8 1.6* 2.1 1.7    GFR: Estimated Creatinine Clearance: 83.2 mL/min (by C-G formula based on SCr of 1.04 mg/dL).  Liver Function Tests: No results for input(s): AST, ALT, ALKPHOS, BILITOT, PROT, ALBUMIN in the last 168 hours. No results for input(s): LIPASE, AMYLASE in the last 168 hours. No results for input(s): AMMONIA in the last 168 hours.  Coagulation Profile: No results for input(s): INR, PROTIME in the last 168 hours.  Cardiac Enzymes: No results for input(s): CKTOTAL, CKMB, CKMBINDEX, TROPONINI in the last 168 hours.  BNP (last 3 results) No results for input(s): PROBNP in the last 8760 hours.  Lipid Profile: No results for input(s): CHOL, HDL, LDLCALC, TRIG, CHOLHDL, LDLDIRECT in the last 72 hours.  Thyroid Function Tests: No results for input(s): TSH, T4TOTAL, FREET4,  T3FREE, THYROIDAB in the last 72 hours.  Anemia Panel: No results for input(s): VITAMINB12, FOLATE, FERRITIN, TIBC, IRON, RETICCTPCT in the last 72 hours.  Urine analysis: No results found for: COLORURINE, APPEARANCEUR, LABSPEC, PHURINE, GLUCOSEU, HGBUR, BILIRUBINUR, KETONESUR, PROTEINUR, UROBILINOGEN, NITRITE, LEUKOCYTESUR  Sepsis Labs: Lactic Acid, Venous    Component Value Date/Time   LATICACIDVEN 0.9 03/26/2020 0958    MICROBIOLOGY: Recent Results (from the past 240 hour(s))  CSF culture w Gram Stain     Status: None   Collection Time: 04/02/20  3:00 PM   Specimen: CSF  Result Value Ref Range Status   Specimen Description CSF  Final   Special Requests NONE  Final   Gram Stain   Final    WBC PRESENT, PREDOMINANTLY MONONUCLEAR NO ORGANISMS SEEN CYTOSPIN SMEAR    Culture   Final    NO GROWTH 3 DAYS Performed at [facility], [place] [address], [place], [place] [address]    Report Status 04/05/2020 FINAL  Final  Blastomyces Antigen     Status: None   Collection Time: 04/03/20  4:42 AM   Specimen: Blood  Result Value Ref Range Status   Blastomyces Antigen None Detected None Detected ng/mL Final    Comment: (NOTE) Results reported as ng/mL in 0.2 - 14.7 ng/mL range Results above the limit of detection but below 0.2 ng/mL are reported as 'Positive, Below the Limit of Quantification' Results above 14.7 ng/mL are reported as 'Positive, Above the Limit of Quantification'    Specimen Type SERUM  Final    Comment: (NOTE) Performed At: [facility] [place], [place] [id number] Bruce Donath MD OZ:[id number]     RADIOLOGY STUDIES/RESULTS: No results found.   LOS: 15 days   Oren Binet, MD  [facility]    To contact the attending provider between 7A-7P or the covering provider during after hours 7P-7A, please log into the web site [email or web address] and access using universal [place] password for that web site. If you do  not have the password, please call the hospital operator.  04/10/2020, 10:48 AM

## 2020-04-10 NOTE — Procedures (Signed)
LUMBAR PUNCTURE (SPINAL TAP) PROCEDURE NOTE  Indication: Cryptococcal meningitis, treatment follow-up   Proceduralists: Amie Portland, MD, Anibal Henderson, NP   Risks of the procedure were dicussed with the patient including post-LP headache, bleeding, infection, weakness/numbness of legs(radiculopathy), death.    Consent obtained from: patient    Procedure Note The patient was prepped and draped, and using sterile technique a 20 gauge quinke spinal needle was inserted in the L4-5 space.  Clear fluid return on the 4th attempt. Opening pressure was 18 cm H2O.  Closing pressure was 11 cm H2O. Approximately 16 cc of CSF were obtained and sent for analysis.  Patient tolerated the procedure well and blood loss was minimal.  Attending MD at bedside for entire procedure.     Anibal Henderson, AGAC-NP [facility] Pager: [phone] If 7pm- 7am, please page neurology on call as listed in [place].  Attending Neurohospitalist Addendum Present for the entire duration of the procedure - scrubbed in. Agree with the note above.  I confirmed the above findings.  --- Amie Portland, MD [facility]

## 2020-04-10 NOTE — Plan of Care (Signed)

## 2020-04-10 NOTE — Progress Notes (Addendum)
[facility]  Date of Admission:  03/26/2020           Reason for visit: Follow up on cryptococcal meningitis  ABX: 3/08-c ethambutol 1200 mg po daily 3/08-c rifabutin 300 mg po daily 2/21-c Amphotericin B 2/21-c Flucytosine 2/21 2/21-c azith (changed from prophy dose weekly to treatment dose daily on 3/08)  TMP-SMX SS daily for PCP prophylaxis; hold 2/28 while on amphotericin  Principal Problem:   Cryptococcal meningitis (HCC) Active Problems:   Leukocytopenia   Macrocytosis   Moderate protein malnutrition (HCC)   Hypoalbuminemia   AIDS (acquired immune deficiency syndrome) (HCC)   Atrial flutter (HCC)   Aortic atherosclerosis (HCC)   Hypertension   SVT (supraventricular tachycardia) (HCC)   Abnormal EKG   Acute nonintractable headache   Smoking   Cocaine use   Cavitary lesion of lung   AKI (acute kidney injury) ([place])   Blurry vision, left eye   Assessment 65 yo male with hiv/aids here for cryptococcal meningitis  #Cryptococcal meningitis Brain mri no acute pathology/cryptococcoma On induction phase of tx with ampho/flucytosine. Plan 2 weeks at least prior to transitioning to consolidation fluconazole; day 1 = 2/21. Headache improving with LP x3 Last lp 2/28 by neurology: OP 14 Csf culture 2/21 and 2/24 with cryptococcus neoforman Will need repeat LP this week Has chronic right eye blurriness from distant trauma; ophthalmology exam no intraocular/retinal lesion  #nodular cavitary process on chest ct #MAC #fungal stain positive sputum cx Wide ddx in this patient with aids including infectious, hiv related noninfectious ([place]) and malignancy Unclear prior TB process if was cavitary/nodular as well 2/23 sputum fungal cx ngtd -- stain showed fungal element 2/23 and 2/24 afb sputum cx -- MAC; stain afb positive --> will start full tx dose 3/08 with azith and ethambutol and rifabutin (given severely depressed cd4 and likely high  burden of disease) No quantiferon gold sent given prior hx tb and afb cx already cooking Urine histoplasma ag negative; blastomyces ag negative Mucor and aspergillus infection in hiv is rather low in prevalence/incidence. As endemic fungi and fungal cx remains negative and he appears well, will hold empiric antifungal  #AIDS/HIV disease Long dx; [name]; previously seeking care in [place], [name]; vl at dx 35k Delaying early ART in setting cryptococcal meningitis -- if 2week LP is negative, will start ART in 1-2 weeks given multiple other OI   #AKI Holding prophylactic bactrim while on amphotericin Cr stable aki resolved  #History of TB Treated through the [facility] health department, 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   -discussed with neurology to repeat LP today or sometimes this week to assess sterility of csf cx  -continue to monitor daily magnesium/potassium level/renal function while on amphotericin -continue to hold bactrim prophy while on amphotericin -start azithromycin 500 mg daily; ethambutol 78m/kg daily; rifabutin 300 mg po daily  -plan to start biktarvy in 1-2 weeks from 04/10/2020   I spent more than 30 minute reviewing data/chart, and coordinating care and >50% direct face to face time providing counseling/discussing diagnostics/treatment plan with patient  --------------------------------- Subjective Afebrile No n/v No headache  He has chronic right eye blurriness from distant trauma. Ophthalmology exam without retinal/intraocular concern  Tolerating ambisome; he is a little more than 2 weeks out now  since starting induction abx   Reviewed labs Sputum cx growing mac and has fungal element on fungal stain (cx ngtd    Objective Blood pressure 115/78, pulse 67,  temperature 98 F (36.7 C), temperature source Oral, resp. rate 16, height _0  (1.854 m), weight 80.8 kg, [name], cooperative, [name], non-tender, [name], clubbing, [name], [name], strength grossly intact   Lab Results: Lab Results  Component Value Date   WBC 4.8 04/10/2020   HGB 11.4 (L) 04/10/2020   HCT 32.6 (L) 04/10/2020   MCV 95.6 04/10/2020   PLT 307 04/10/2020    Lab Results  Component Value Date   NA 132 (L) 04/10/2020   K 4.1 04/10/2020   CO2 21 (L) 04/10/2020   GLUCOSE 163 (H) 04/10/2020   BUN 13 04/10/2020   CREATININE 1.04 04/10/2020   CALCIUM 9.1 04/10/2020   GFRNONAA >60 04/10/2020    Lab Results  Component Value Date   ALT 17 03/31/2020   AST 24 03/31/2020   ALKPHOS 100 03/31/2020   BILITOT 0.8 03/31/2020    No results found for: CRP     Component Value Date/Time   ESRSEDRATE 30 (H) 03/26/2020 0958     Serology: Histo/blasto serology negative 2/24 csf CrAg titer 1:640 2/21 csf vdrl, [name], 23, 25) sputum cx not acceptable 2/24 csf cx cryptococcus neoforman; fungal cx negative 2/24 sputum afb smear positive; cx MAC 2/24 sputum fungal stain negative; fungal cx in progress 2/23 sputum afb smear positive; cx ngtd 2/23 sputum fungal stain positive (fungal element); cx ngtd 2/21 csf bacterial cx cryptococcus neoforman; fungal cx negative     Imaging: I have personally reviewed imaging/results in epic  2/21 brain mri 1. No evidence of acute nonvascular abnormality. 2. The area of  abnormality seen on same-day CTV is poorly characterized on this study secondary to motion and flow related artifact. While the appearance on the CTV was atypical in appearance/morphology for acute thrombus, a follow up CTV in approximately one week could evaluate for change if the patient's symptoms do not improve or worsen.  2/24 ct head No acute process; [name], [name], possibly reactive. Again, [name], as clinically indicated. 4. Areas of volume loss and bronchiectasis in the left upper and left lower lobes may be due to resolving infection/post infectious scarring. 5. Chronic calcific pancreatitis. 6.  Aortic atherosclerosis 7.  Emphysema  [facility] [phone] pager 04/10/2020, 9:14 AM

## 2020-04-10 NOTE — Progress Notes (Signed)
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 Patient started on amphotericin B and flucytosine 2/21.   Patient is now positive for MAC infection. Started on azithromycin, ethambutol and rifampin. ID is recommending a repeat lumbar puncture to ensure clearance of the CSF at the end of induction therapy.   CD4 count <35; VL 25,300 (2/23)   Scr stable at 1.04 today -will continue to monitor closely K 4.1 Mg 1.7- plan for 4 g replacement today  Antimicrobials: - amphotericin B 2/21-c  - flucytosine 2/21-c - Azithromycin 500 daily - ethambultol 1261m daily - rifabutin 3042mdaily   Plan: - continue amphotericin B and flucytosine for now - Considering transitioning to fluconazole in the next few days  - Continue pre-fluid NS  750 mL and post fluid NS 750 mL  - Mg 4g IV x1 - monitor Scr daily - Monitor CBC QD for signs of neutropenia    TyNicoletta DressPharmD, BCIDP Infectious Disease Pharmacist  Phone: [phone]/09/2020 11:55 AM

## 2020-04-10 NOTE — Progress Notes (Addendum)
Neurology Progress Note Brief HPI: 65 year old male with HIV/AIDS found to have cryptococcal meningitis with high intracranial pressures s/p 3 lumbar punctures with normalization of pressures and relief of headache. ID requested neurology follow-up for lumbar puncture to ensure opening pressures remain normal and for evaluation of CSF cultures post-treatment.  Subjective: Patient with persistent blurry vision in the right eye but states that this is chronic. Visual fields remain full, states vision is initially clear and with prolonged focus on single object, vision loses clarity OD. He claims subjective improvement in headache compared to admission without further pressure. He now endorses that he only has his residual "normal" headaches that are much less bothersome and typically resolve with Tylenol.  Patient is a poor historian with vague descriptions of impairment of eyesight and headaches.  Exam: Vitals:   04/10/20 0317 04/10/20 0845  BP: 138/85 115/78  Pulse: 60 67  Resp: 18 16  Temp: 98.1 F (36.7 C) 98 F (36.7 C)  SpO2: 99% 99%   Gen: In bed, watching television, in no acute distress Resp: non-labored breathing, no respiratory distress Abd: soft, non-distended, non-tender  Neuro: MS: alert and oriented to person, place, time, and situation. Speech is without aphasia or dysarthria. Naming, comprehension, and fluency are intact. No signs of neglect are present. CN:  II: PERRL. Visual fields full.  III, IV, VI: EOMI. Lid elevation symmetric and full.  V: Sensation is intact to light touch and symmetrical to face.  VII: Face is symmetric resting and smiling  VIII: Hearing intact to voice. IX, X: Palate elevates symmetrically. Phonation is normal.  XI: Shoulder shrug 5/5. XII: Tongue protrudes midline Motor: 5/5 strength to all muscle groups tested.  Tone: is normal and bulk is normal Sensation- Intact and symmetric to light touch in upper and lower extremities.   Coordination: FTN intact bilaterally.  Pertinent Labs: CBC    Component Value Date/Time   WBC 4.8 04/10/2020 0352   RBC 3.41 (L) 04/10/2020 0352   HGB 11.4 (L) 04/10/2020 0352   HCT 32.6 (L) 04/10/2020 0352   PLT 307 04/10/2020 0352   MCV 95.6 04/10/2020 0352   MCH 33.4 04/10/2020 0352   MCHC 35.0 04/10/2020 0352   RDW 11.4 (L) 04/10/2020 0352   LYMPHSABS 0.6 (L) 03/26/2020 0958   MONOABS 0.7 03/26/2020 0958   EOSABS 0.2 03/26/2020 0958   BASOSABS 0.0 03/26/2020 0958   CMP     Component Value Date/Time   NA 132 (L) 04/10/2020 0352   K 4.1 04/10/2020 0352   CL 103 04/10/2020 0352   CO2 21 (L) 04/10/2020 0352   GLUCOSE 163 (H) 04/10/2020 0352   BUN 13 04/10/2020 0352   CREATININE 1.04 04/10/2020 0352   CALCIUM 9.1 04/10/2020 0352   PROT 7.2 03/31/2020 0259   ALBUMIN 2.8 (L) 03/31/2020 0259   AST 24 03/31/2020 0259   ALT 17 03/31/2020 0259   ALKPHOS 100 03/31/2020 0259   BILITOT 0.8 03/31/2020 0259   GFRNONAA >60 04/10/2020 0352   Imaging: CT head- no acute abnormality MRI Brain:  1. No evidence of acute nonvascular abnormality. 2. The area of abnormality seen on same-day CTV is poorly characterized on this study secondary to motion and flow related artifact. While the appearance on the CTV was atypical in appearance/morphology for acute thrombus, a follow up CTV in approximately one week could evaluate for change if the patient's symptoms do not improve or worsen.  Assessment: 65 year old male with HIV initially admitted with HA found to have CSF  with cryptococcal antigen positive. His infection and HIV has been managed by primary team and ID. His HAs have decreased overall with a chronic blurred vision in the OD- per ophthalmology suspect combination of refractive error and mild cataract formation and recommended outpatient follow-up. He has had 3 LPs with opening pressures of 27, 26, and 14 respectively with last LP on 2/28. Per ID 3/8 would like post-treatment CSF  cultures and opening pressure and neurology was asked to assist with this procedure.   Impression: 1. Cryptococcal meningitis  2. HIV  Plan:  1. Plan to repeat LP for CSF bacterial and fungal cultures and evaluation of opening pressures per ID recommendation.    - LP complete 3/8 with opening pressure of 18 cm H2O  - Closing pressure 20 cm H2O  - CSF fungal and bacterial culture ordered per ID 2. Continue management of HIV and infection by primary team and ID.  Anibal Henderson, AGACNP-BC [facility] [phone]  Neurology will be available on an as-needed basis going forward.  CSF studies to be followed up by infectious disease.  Please reconsult [place] if new neurological concerns arise.  Attending Neurologist's note:  I personally saw this patient, gathering history, performing a neurologic examination, reviewing relevant labs, and formulated the assessment and plan, adding the note above for completeness and clarity to accurately reflect my thoughts  Lesleigh Noe MD-PhD [facility] [phone] Available 7 AM to 7 PM, outside these hours please contact Neurologist on call listed on AMION

## 2020-04-11 DIAGNOSIS — B20 Human immunodeficiency virus [HIV] disease: Secondary | ICD-10-CM | POA: Diagnosis not present

## 2020-04-11 DIAGNOSIS — B451 Cerebral cryptococcosis: Secondary | ICD-10-CM | POA: Diagnosis not present

## 2020-04-11 DIAGNOSIS — A310 Pulmonary mycobacterial infection: Secondary | ICD-10-CM | POA: Diagnosis not present

## 2020-04-11 LAB — BASIC METABOLIC PANEL
Anion gap: 10 (ref 5–15)
BUN: 17 mg/dL (ref 8–23)
CO2: 17 mmol/L — ABNORMAL LOW (ref 22–32)
Calcium: 9 mg/dL (ref 8.9–10.3)
Chloride: 104 mmol/L (ref 98–111)
Creatinine, Ser: 0.98 mg/dL (ref 0.61–1.24)
GFR, Estimated: >60 mL/min (ref >60)
Glucose, Bld: 137 mg/dL — ABNORMAL HIGH (ref 70–99)
Potassium: 4.5 mmol/L (ref 3.5–5.1)
Sodium: 131 mmol/L — ABNORMAL LOW (ref 135–145)

## 2020-04-11 LAB — CBC
HCT: 33.9 % — ABNORMAL LOW (ref 39.0–52.0)
Hemoglobin: 11.7 g/dL — ABNORMAL LOW (ref 13.0–17.0)
MCH: 33.2 pg (ref 26.0–34.0)
MCHC: 34.5 g/dL (ref 30.0–36.0)
MCV: 96.3 fL (ref 80.0–100.0)
Platelets: 290 10*3/uL (ref 150–400)
RBC: 3.52 MIL/uL — ABNORMAL LOW (ref 4.22–5.81)
RDW: 11.4 % — ABNORMAL LOW (ref 11.5–15.5)
WBC: 3.9 10*3/uL — ABNORMAL LOW (ref 4.0–10.5)
nRBC: 0 % (ref 0.0–0.2)

## 2020-04-11 LAB — AFB ORGANISM ID BY DNA PROBE
M avium complex: 8 — AB
M tuberculosis complex: NEGATIVE

## 2020-04-11 LAB — MAGNESIUM: Magnesium: 2 mg/dL (ref 1.7–2.4)

## 2020-04-11 LAB — ACID FAST CULTURE WITH REFLEXED SENSITIVITIES (MYCOBACTERIA): Acid Fast Culture: POSITIVE — AB

## 2020-04-11 MED ORDER — SULFAMETHOXAZOLE-TRIMETHOPRIM 800-160 MG PO TABS
1.0000 | ORAL_TABLET | ORAL | Status: DC
  Administered 2020-04-11 – 2020-04-13 (×2): 1 via ORAL
  Filled 2020-04-11 (×2): qty 1

## 2020-04-11 MED ORDER — FLUCONAZOLE 200 MG PO TABS
800.0000 mg | ORAL_TABLET | Freq: Every day | ORAL | Status: DC
  Administered 2020-04-11 – 2020-04-15 (×5): 800 mg via ORAL
  Filled 2020-04-11 (×5): qty 4

## 2020-04-11 NOTE — Progress Notes (Addendum)
[facility]  Date of Admission:  03/26/2020           Reason for visit: Follow up on cryptococcal meningitis  ABX: 3/08-c ethambutol 1200 mg po daily 3/08-c rifabutin 300 mg po daily 2/21-c Amphotericin B 2/21-c Flucytosine 2/21 2/21-c azith (changed from prophy dose weekly to treatment dose daily on 3/08)  TMP-SMX SS daily for PCP prophylaxis; hold 2/28 while on amphotericin  Principal Problem:   Cryptococcal meningitis (HCC) Active Problems:   Leukocytopenia   Macrocytosis   Moderate protein malnutrition (HCC)   Hypoalbuminemia   AIDS (acquired immune deficiency syndrome) (HCC)   Atrial flutter (HCC)   Aortic atherosclerosis (HCC)   Hypertension   SVT (supraventricular tachycardia) (HCC)   Abnormal EKG   Acute nonintractable headache   Smoking   Cocaine use   Cavitary lesion of lung   AKI (acute kidney injury) ([place])   Blurry vision, left eye   Mycobacterium avium infection ([place])   Assessment 66 yo male with hiv/aids here for cryptococcal meningitis  #Cryptococcal meningitis Brain mri no acute pathology/cryptococcoma On induction phase of tx with ampho/flucytosine. Plan 2 weeks at least prior to transitioning to consolidation fluconazole; day 1 = 2/21. Headache improving with LP x3 Last lp 2/28 by neurology: OP 14 Csf culture 2/21 and 2/24 with cryptococcus neoforman Repeat LPL 3/08 culture in progress.  Has chronic right eye blurriness from distant trauma; ophthalmology exam no intraocular/retinal lesion Will transition to high dose fluconazole pending 3/08 csf cx  #nodular cavitary process on chest ct #MAC #fungal stain positive sputum cx Wide ddx in this patient with aids including infectious, hiv related noninfectious ([place]) and malignancy Unclear prior TB process if was cavitary/nodular as well 2/23 sputum fungal cx ngtd -- stain showed fungal element 2/23 and 2/24 afb sputum cx -- MAC; stain afb positive --> will start  full tx dose 3/08 with azith and ethambutol and rifabutin (given severely depressed cd4 and likely high burden of disease) No quantiferon gold sent given prior hx tb and afb cx already cooking Urine histoplasma ag negative; blastomyces ag negative Mucor and aspergillus infection in hiv is rather low in prevalence/incidence. As endemic fungi and fungal cx remains negative and he appears well, will hold empiric antifungal  #AIDS/HIV disease Long dx; [name]; previously seeking care in [place], [name]; vl at dx 35k Delaying early ART in setting cryptococcal meningitis -- if 2week LP is negative, will start ART in 1-2 weeks given multiple other OI    #AKI Holding prophylactic bactrim while on amphotericin Cr stable aki resolved  #History of TB Treated through the [facility], completed treatment April 2001 after 29 weeks  #hcm Will send hepatitis serology   Recommendation  -stop ambisome/flucytosine; start high dose fluconazole 817m -if csf cx negative for cryptococcus on 3/08, will transition to consolidation therapy fluconazole 400 mg -continue azithromycin 500 mg daily; ethambutol 160mkg daily; rifabutin 300 mg po daily  -plan to start biktarvy in 1-2 weeks from 04/10/2020 if csf cx sterilized -will resume bactrim prophy as well   I spent more than 30 minute reviewing data/chart, and coordinating care and >50% direct face to face time providing counseling/discussing diagnostics/treatment plan with patient  --------------------------------- Subjective Afebrile No n/v No headache Stable right eye blurriness Tolerating azith/ethambutol/rifabutin   Objective Blood pressure 135/77, pulse (!) 59, temperature 97.6 F (36.4  C), temperature source Oral, resp. rate 18, height _0  (1.854 m), weight 80.8 kg, SpO2 99 %. Body mass index is 23.5 kg/m.  Physical  Exam  General: Alert, cooperative, [name], non-tender, [name], clubbing, [name], [name], strength grossly intact   Lab Results: Lab Results  Component Value Date   WBC 3.9 (L) 04/11/2020   HGB 11.7 (L) 04/11/2020   HCT 33.9 (L) 04/11/2020   MCV 96.3 04/11/2020   PLT 290 04/11/2020    Lab Results  Component Value Date   NA 131 (L) 04/11/2020   K 4.5 04/11/2020   CO2 17 (L) 04/11/2020   GLUCOSE 137 (H) 04/11/2020   BUN 17 04/11/2020   CREATININE 0.98 04/11/2020   CALCIUM 9.0 04/11/2020   GFRNONAA >60 04/11/2020    Lab Results  Component Value Date   ALT 17 03/31/2020   AST 24 03/31/2020   ALKPHOS 100 03/31/2020   BILITOT 0.8 03/31/2020    No results found for: CRP     Component Value Date/Time   ESRSEDRATE 30 (H) 03/26/2020 0958     Serology: Histo/blasto serology negative 2/24 csf CrAg titer 1:640 2/21 csf vdrl, [name], 23, 25) sputum cx not acceptable 2/24 csf cx cryptococcus neoforman; fungal cx negative 2/24 sputum afb smear positive; cx MAC 2/24 sputum fungal stain negative; fungal cx in progress 2/23 sputum afb smear positive; cx ngtd 2/23 sputum fungal stain positive (fungal element); cx ngtd 2/21 csf bacterial cx cryptococcus neoforman; fungal cx negative     Imaging: I have personally reviewed imaging/results in epic  2/21 brain mri 1. No evidence of acute nonvascular abnormality. 2. The area of abnormality seen on same-day CTV is poorly characterized on this study secondary to motion and flow related artifact. While the appearance on the CTV was atypical  in appearance/morphology for acute thrombus, a follow up CTV in approximately one week could evaluate for change if the patient's symptoms do not improve or worsen.  2/24 ct head No acute process; [name], [name], possibly reactive. Again, [name], as clinically indicated. 4. Areas of volume loss and bronchiectasis in the left upper and left lower lobes may be due to resolving infection/post infectious scarring. 5. Chronic calcific pancreatitis. 6.  Aortic atherosclerosis 7.  Emphysema  [facility] [phone] pager 04/11/2020, 5:19 PM

## 2020-04-11 NOTE — Progress Notes (Signed)
PROGRESS NOTE        PATIENT DETAILS Name: Ricky [name] Age: 65 y.o. Sex: male Date of Birth: [birth year] Admit Date: 03/26/2020 Admitting Physician Reubin Milan, MD PCP:Pcp, No  Brief Narrative: Patient is a 65 y.o. 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 2/22>> CT chest with contrast: Nodular/cavitary airspace consolidation in the left upper/lower lobes 2/22>> Echo: EF 50-55% 2/24>> CT head: No acute intracranial process 2/24>> nuclear stress test: No ischemia  Antimicrobial therapy: Amphotericin/flucytosine: 2/21>>3/9 Ethambutol: 3/8>> Rifabutin: 3/8>> Zithromax: 3/8>> Fluconazole: 3/9>>  Microbiology data: 2/21>> CSF culture: Cryptococcus neoformans 2/23>> sputum AFB smear negative 2/23>> sputum AFB culture positive 2/24>> sputum AFB smear: Negative 2/24>> CSF culture: Cryptococcus neoformans 2/28>> CSF culture: No growth 3/8>> CSF Gram stain: Cryptococcus neoformans 3/8>> CSF culture: No growth  Procedures : LP>> 2/22, 2/24, 2/28, 3/8  Consults: Neurology, cardiology Terri Skains), ID, ophthalmology  DVT Prophylaxis : SCDs Start: 03/26/20 1946   Subjective: Lying comfortably in bed-no major issues overnight.  Assessment/Plan: Cryptococcal meningitis: Overall improved-completed a course of IV amphotericin/flucytosine-has been transitioned to fluconazole.  Await final culture results of LP from 3/8.  Await further recommendations from infectious disease.    Left lung cavitary lesion-: AFB smear negative-however AFB culture on 2/23+ for MAC-started on  ethambutol/rifabutin and Zithromax.  Histoplasma/blastomycosis antigen negative.   HIV/AIDS: Noncompliant-timing for initiation of ART defer to ID.  SVT: Continue telemetry monitoring-on Cardizem-now with transient second-degree heart block-await cardiology eval.  Nuclear stress test negative-echo with preserved EF.  TSH within normal limit  Recent COVID-19 infection: Asymptomatic-out of quarantine window.  Chronic right eye blurriness: Evaluated by ophthalmology on 3/7-no evidence of infection.  History of tuberculosis-apparently treated at the [facility] and April 2001   Lab Results  Component Value Date   [place] (A) 03/03/2020    Chronic calcific pancreatitis: Seen incidentally on CT chest  Moderate protein calorie malnutrition  History of cocaine use  Tobacco abuse   Diet: Diet Order            Diet Heart Room service appropriate? Yes; Fluid consistency: Thin  Diet effective now                  Code Status: Full code  Family Communication: Will reach out to family over the next few days.  Disposition Plan: Status is: Inpatient  Remains inpatient appropriate because:Inpatient level of care appropriate due to severity of illness   Dispo:  Patient From: Home  Planned Disposition: Home  Medically stable for discharge: No     Barriers to Discharge: Cryptococcal meningitis-induction therapy with IV amphotericin B.   Antimicrobial agents: Anti-infectives (From admission, onward)   Start     Dose/Rate Route Frequency Ordered Stop   04/11/20 1200  fluconazole (DIFLUCAN) tablet 800 mg        800 mg Oral Daily 04/11/20 1052     04/11/20 1000  azithromycin (ZITHROMAX) tablet 500 mg        500 mg Oral Daily 04/10/20 1038     04/10/20 1130  ethambutol (MYAMBUTOL) tablet 1,200 mg  1,200 mg Oral Daily 04/10/20 1038     04/10/20 1130  rifabutin (MYCOBUTIN) capsule 300 mg        300 mg Oral Daily 04/10/20 1038      03/28/20 1000  azithromycin (ZITHROMAX) tablet 1,200 mg  Status:  Discontinued        1,200 mg Oral Weekly 03/28/20 0912 04/10/20 1038   03/28/20 1000  sulfamethoxazole-trimethoprim (BACTRIM) 400-80 MG per tablet 1 tablet  Status:  Discontinued        1 tablet Oral Daily 03/28/20 0912 04/02/20 0919   03/27/20 2115  amphotericin B liposome (AMBISOME) 300 mg in dextrose 5 % 500 mL IVPB  Status:  Discontinued        300 mg 287.5 mL/hr over 120 Minutes Intravenous Every 24 hours 03/26/20 2024 03/26/20 2227   03/26/20 2227  amphotericin B liposome (AMBISOME) 300 mg in dextrose 5 % 500 mL IVPB  Status:  Discontinued        300 mg 287.5 mL/hr over 120 Minutes Intravenous Every 24 hours 03/26/20 2227 04/11/20 1052   03/26/20 1945  amphotericin B liposome (AMBISOME) 300 mg in dextrose 5 % 500 mL IVPB  Status:  Discontinued        300 mg 287.5 mL/hr over 120 Minutes Intravenous Every 24 hours 03/26/20 1850 03/26/20 2024   03/26/20 1900  flucytosine (ANCOBON) capsule 2,000 mg  Status:  Discontinued        25 mg/kg  80 kg Oral Every 6 hours 03/26/20 1850 04/11/20 1052       Time spent: 15- minutes-Greater than 50% of this time was spent in counseling, explanation of diagnosis, planning of further management, and coordination of care.  MEDICATIONS: Scheduled Meds: . azithromycin  500 mg Oral Daily  . dextromethorphan  30 mg Oral BID  . dextrose  10 mL Intravenous Q24H  . dextrose  10 mL Intravenous Q24H  . diltiazem  180 mg Oral Daily  . ethambutol  1,200 mg Oral Daily  . fluconazole  800 mg Oral Daily  . pravastatin  20 mg Oral QPM  . rifabutin  300 mg Oral Daily   Continuous Infusions:  PRN Meds:.acetaminophen **OR** acetaminophen, acetaminophen, diphenhydrAMINE **OR** diphenhydrAMINE, ibuprofen, ondansetron **OR** ondansetron (ZOFRAN) IV, oxyCODONE   PHYSICAL EXAM: Vital signs: Vitals:   04/10/20 2348 04/11/20 0423 04/11/20 0808 04/11/20 1141  BP: 131/88 135/85 (!) 136/92 (!) 133/93   Pulse: 63 60 70 63  Resp: _0 Temp: 98 F (36.7 C) 97.8 F (36.6 C) 98.2 F (36.8 C) 97.8 F (36.6 C)  TempSrc: Oral Oral Oral Oral  SpO2: 97% 97% 100% 99%  Weight:      Height:       Filed Weights   03/26/20 1839 03/27/20 0141  Weight: 80 kg 80.8 kg   Body mass index is 23.5 kg/m.   Gen Exam:Alert awake-not in any distress HEENT:atraumatic, normocephalic Chest: B/L clear to auscultation anteriorly CVS:S1S2 regular Abdomen:soft non tender, non distended Extremities:no edema Neurology: Non focal Skin: no rash  I have personally reviewed following labs and imaging studies  LABORATORY DATA: CBC: Recent Labs  Lab 04/07/20 0301 04/08/20 0235 04/09/20 0343 04/10/20 0352 04/11/20 0434  WBC 4.4 5.2 5.7 4.8 3.9*  HGB 12.9* 12.4* 11.9* 11.4* 11.7*  HCT 36.4* 35.8* 33.4* 32.6* 33.9*  MCV 95.5 96.0 94.6 95.6 96.3  PLT 380 352 324 307 947    Basic Metabolic Panel: Recent Labs  Lab 04/07/20 0301 04/08/20 0235 04/09/20 0343  04/10/20 0352 04/11/20 0434  NA 133* 133* 131* 132* 131*  K 4.3 4.1 4.3 4.1 4.5  CL 104 102 103 103 104  CO2 23 21* 20* 21* 17*  GLUCOSE 126* 112* 111* 163* 137*  BUN _0 CREATININE 1.12 1.03 1.21 1.04 0.98  CALCIUM 9.2 9.3 9.0 9.1 9.0  MG 1.8 1.6* 2.1 1.7 2.0    GFR: Estimated Creatinine Clearance: 88.3 mL/min (by C-G formula based on SCr of 0.98 mg/dL).  Liver Function Tests: No results for input(s): AST, ALT, ALKPHOS, BILITOT, PROT, ALBUMIN in the last 168 hours. No results for input(s): LIPASE, AMYLASE in the last 168 hours. No results for input(s): AMMONIA in the last 168 hours.  Coagulation Profile: No results for input(s): INR, PROTIME in the last 168 hours.  Cardiac Enzymes: No results for input(s): CKTOTAL, CKMB, CKMBINDEX, TROPONINI in the last 168 hours.  BNP (last 3 results) No results for input(s): PROBNP in the last 8760 hours.  Lipid Profile: No results for input(s): CHOL, HDL, LDLCALC, TRIG,  CHOLHDL, LDLDIRECT in the last 72 hours.  Thyroid Function Tests: No results for input(s): TSH, T4TOTAL, FREET4, T3FREE, THYROIDAB in the last 72 hours.  Anemia Panel: No results for input(s): VITAMINB12, FOLATE, FERRITIN, TIBC, IRON, RETICCTPCT in the last 72 hours.  Urine analysis: No results found for: COLORURINE, APPEARANCEUR, LABSPEC, PHURINE, GLUCOSEU, HGBUR, BILIRUBINUR, KETONESUR, PROTEINUR, UROBILINOGEN, NITRITE, LEUKOCYTESUR  Sepsis Labs: Lactic Acid, Venous    Component Value Date/Time   LATICACIDVEN 0.9 03/26/2020 0958    MICROBIOLOGY: Recent Results (from the past 240 hour(s))  CSF culture w Gram Stain     Status: None   Collection Time: 04/02/20  3:00 PM   Specimen: CSF  Result Value Ref Range Status   Specimen Description CSF  Final   Special Requests NONE  Final   Gram Stain   Final    WBC PRESENT, PREDOMINANTLY MONONUCLEAR NO ORGANISMS SEEN CYTOSPIN SMEAR    Culture   Final    NO GROWTH 3 DAYS Performed at [facility], [place] [address], [place], [place] [address]    Report Status 04/05/2020 FINAL  Final  Blastomyces Antigen     Status: None   Collection Time: 04/03/20  4:42 AM   Specimen: Blood  Result Value Ref Range Status   Blastomyces Antigen None Detected None Detected ng/mL Final    Comment: (NOTE) Results reported as ng/mL in 0.2 - 14.7 ng/mL range Results above the limit of detection but below 0.2 ng/mL are reported as 'Positive, Below the Limit of Quantification' Results above 14.7 ng/mL are reported as 'Positive, Above the Limit of Quantification'    Specimen Type SERUM  Final    Comment: (NOTE) Performed At: [facility], [place] [id number] Bruce Donath MD QM:[id number]   CSF culture w Stat Gram Stain     Status: None (Preliminary result)   Collection Time: 04/10/20  5:18 PM   Specimen: CSF; Cerebrospinal Fluid  Result Value Ref Range Status   Specimen Description CSF  Final   Special  Requests NONE  Final   Gram Stain   Final    WBC PRESENT, PREDOMINANTLY MONONUCLEAR CRYPTOCOCCUS NEOFORMANS CYTOSPIN SMEAR    Culture   Final    NO GROWTH < 24 HOURS Performed at [facility], [address] [place], [place] [address]    Report Status PENDING  Incomplete    RADIOLOGY STUDIES/RESULTS: No results found.  LOS: 16 days   Oren Binet, MD  [facility]    To contact the attending provider between 7A-7P or the covering provider during after hours 7P-7A, please log into the web site [email or web address] and access using universal [place] password for that web site. If you do not have the password, please call the hospital operator.  04/11/2020, 11:56 AM

## 2020-04-11 NOTE — Progress Notes (Signed)
Antibiotic Monitoring  HPI: Pt is a 16 YOM with PMH significant for HIV who came in 2/21 complaining of 2 week h/o of HA, and increasing sinus pressure, found to have cryptococcal meningitis. Patient started on amphotericin B and flucytosine 2/21.   Patient is now positive for MAC infection. Started on azithromycin, ethambutol and rifampin. Repeat lumbar puncture has been performed to ensure sterilization of the CSF at the end of induction therapy. Will now begin the consolidation phase of therapy with high dose fluconazole. Will continue this dose at least until his repeat lumbar puncture is confirmed clear, then can consider going to 477m PO daily to complete the 2 months of consolidation. The final phase will be maintenance where the dose can be reduced to 2080mdaily and will likely be continued for at least a year.   Will hold on starting [place] a few more weeks for the concern of IRIS in this infection.   CD4 count <35; VL 25,300 (2/23)   Scr stable at 0.98 K 4.5 Mg 2.0  Antimicrobials: - amphotericin B 2/21-3/8 - flucytosine 2/21-3/8 - fluconazole 8008mO daily  - Azithromycin 500 daily - ethambultol 1200m35mily - rifabutin 300mg23mly   Plan: - Stop amphotericin B and flucytosine for now - Start high dose fluconazole 800mg 72maily  - Stop pre-fluid NS  750 mL and post fluid NS 750 mL  - Mg 4g IV x1 - monitor Scr, qtc, LFTs, and signs of worsening vision    Loletha Bertini Nicoletta DressmD, BCIDP [facility] Pharmacist  Phone: [phone] 10:52 AM

## 2020-04-12 DIAGNOSIS — B451 Cerebral cryptococcosis: Secondary | ICD-10-CM | POA: Diagnosis not present

## 2020-04-12 DIAGNOSIS — J984 Other disorders of lung: Secondary | ICD-10-CM | POA: Diagnosis not present

## 2020-04-12 DIAGNOSIS — A310 Pulmonary mycobacterial infection: Secondary | ICD-10-CM | POA: Diagnosis not present

## 2020-04-12 DIAGNOSIS — B20 Human immunodeficiency virus [HIV] disease: Secondary | ICD-10-CM | POA: Diagnosis not present

## 2020-04-12 LAB — BASIC METABOLIC PANEL
Anion gap: 8 (ref 5–15)
BUN: 20 mg/dL (ref 8–23)
CO2: 22 mmol/L (ref 22–32)
Calcium: 9.4 mg/dL (ref 8.9–10.3)
Chloride: 104 mmol/L (ref 98–111)
Creatinine, Ser: 1.41 mg/dL — ABNORMAL HIGH (ref 0.61–1.24)
GFR, Estimated: 56 mL/min — ABNORMAL LOW (ref >60)
Glucose, Bld: 102 mg/dL — ABNORMAL HIGH (ref 70–99)
Potassium: 4.6 mmol/L (ref 3.5–5.1)
Sodium: 134 mmol/L — ABNORMAL LOW (ref 135–145)

## 2020-04-12 LAB — CBC
HCT: 33.9 % — ABNORMAL LOW (ref 39.0–52.0)
Hemoglobin: 12.2 g/dL — ABNORMAL LOW (ref 13.0–17.0)
MCH: 34 pg (ref 26.0–34.0)
MCHC: 36 g/dL (ref 30.0–36.0)
MCV: 94.4 fL (ref 80.0–100.0)
Platelets: 278 10*3/uL (ref 150–400)
RBC: 3.59 MIL/uL — ABNORMAL LOW (ref 4.22–5.81)
RDW: 11.4 % — ABNORMAL LOW (ref 11.5–15.5)
WBC: 3.3 10*3/uL — ABNORMAL LOW (ref 4.0–10.5)
nRBC: 0 % (ref 0.0–0.2)

## 2020-04-12 LAB — MAGNESIUM: Magnesium: 1.8 mg/dL (ref 1.7–2.4)

## 2020-04-12 MED ORDER — SODIUM CHLORIDE 0.9 % IV SOLN: INTRAVENOUS | Status: AC

## 2020-04-12 NOTE — Progress Notes (Addendum)
PROGRESS NOTE        PATIENT DETAILS Name: Ricky [name] Age: 65 y.o. Sex: male Date of Birth: 1955/06/16 Admit Date: 03/26/2020 Admitting Physician Reubin Milan, MD PCP:Pcp, No  Brief Narrative: Patient is a 65 y.o. 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 2/22>> CT chest with contrast: Nodular/cavitary airspace consolidation in the left upper/lower lobes 2/22>> Echo: EF 50-55% 2/24>> CT head: No acute intracranial process 2/24>> nuclear stress test: No ischemia  Antimicrobial therapy: Amphotericin/flucytosine: 2/21>>3/9 Ethambutol: 3/8>> Rifabutin: 3/8>> Zithromax: 3/8>> Fluconazole: 3/9>>  Microbiology data: 2/21>> CSF culture: Cryptococcus neoformans 2/23>> sputum AFB smear negative 2/23>> sputum AFB DNA probe: Positive for M avium complex 2/24>> sputum AFB smear: Negative 2/24>> CSF culture: Cryptococcus neoformans 2/28>> CSF culture: No growth 3/8>> CSF Gram stain: Cryptococcus neoformans 3/8>> CSF culture: No growth  Procedures : LP>> 2/22, 2/24, 2/28, 3/8  Consults: Neurology, cardiology Terri Skains), ID, ophthalmology  DVT Prophylaxis : SCDs Start: 03/26/20 1946   Subjective: Lying comfortably in bed-no major issues overnight.  Assessment/Plan: Cryptococcal meningitis: Overall improved-completed a course of IV amphotericin/flucytosine-has been transitioned to fluconazole.  Await final culture results of LP from 3/8.  Discussed with ID-Dr. Johnny Bridge on 3/10-recommending continued inpatient until 3/14-to ensure that CSF cultures are negative before  consideration of discharge.    Mild AKI: Start IV fluids today-reevaluate electrolytes tomorrow.  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  Histoplasma/blastomycosis antigen negative.   HIV/AIDS: Noncompliant-timing for initiation of ART defer to ID.  SVT: Continue telemetry monitoring-on Cardizem-now with transient second-degree heart block-await cardiology eval.  Nuclear stress test negative-echo with preserved EF.  TSH within normal limit  Recent COVID-19 infection: Asymptomatic-out of quarantine window.  Chronic right eye blurriness: Evaluated by ophthalmology on 3/7-no evidence of infection.  History of tuberculosis-apparently treated at the [facility] and April 2001   Lab Results  Component Value Date   Lynn (A) 03/03/2020    Chronic calcific pancreatitis: Seen incidentally on CT chest  Moderate protein calorie malnutrition  History of cocaine use  Tobacco abuse   Diet: Diet Order            Diet Heart Room service appropriate? Yes; Fluid consistency: Thin  Diet effective now                  Code Status: Full code  Family Communication: Will reach out to family over the next few days.  Disposition Plan: Status is: Inpatient  Remains inpatient appropriate because:Inpatient level of care appropriate due to severity of illness   Dispo:  Patient From: Home  Planned Disposition: Home  Medically stable for discharge: No     Barriers to Discharge: Cryptococcal meningitis-awaiting CSF cultures to be final before consideration of discharge-Per discussion with ID on 3/10-potential discharge on 3/14.  Antimicrobial agents: Anti-infectives (From admission, onward)   Start     Dose/Rate Route Frequency Ordered Stop   04/11/20 1815  sulfamethoxazole-trimethoprim (BACTRIM DS) 800-160 MG per tablet 1 tablet        1 tablet Oral Once per day on  Mon Wed Fri 04/11/20  1722     04/11/20 1200  fluconazole (DIFLUCAN) tablet 800 mg        800 mg Oral Daily 04/11/20 1052     04/11/20 1000  azithromycin (ZITHROMAX) tablet 500 mg        500 mg Oral Daily 04/10/20 1038     04/10/20 1130  ethambutol (MYAMBUTOL) tablet 1,200 mg        1,200 mg Oral Daily 04/10/20 1038     04/10/20 1130  rifabutin (MYCOBUTIN) capsule 300 mg        300 mg Oral Daily 04/10/20 1038     03/28/20 1000  azithromycin (ZITHROMAX) tablet 1,200 mg  Status:  Discontinued        1,200 mg Oral Weekly 03/28/20 0912 04/10/20 1038   03/28/20 1000  sulfamethoxazole-trimethoprim (BACTRIM) 400-80 MG per tablet 1 tablet  Status:  Discontinued        1 tablet Oral Daily 03/28/20 0912 04/02/20 0919   03/27/20 2115  amphotericin B liposome (AMBISOME) 300 mg in dextrose 5 % 500 mL IVPB  Status:  Discontinued        300 mg 287.5 mL/hr over 120 Minutes Intravenous Every 24 hours 03/26/20 2024 03/26/20 2227   03/26/20 2227  amphotericin B liposome (AMBISOME) 300 mg in dextrose 5 % 500 mL IVPB  Status:  Discontinued        300 mg 287.5 mL/hr over 120 Minutes Intravenous Every 24 hours 03/26/20 2227 04/11/20 1052   03/26/20 1945  amphotericin B liposome (AMBISOME) 300 mg in dextrose 5 % 500 mL IVPB  Status:  Discontinued        300 mg 287.5 mL/hr over 120 Minutes Intravenous Every 24 hours 03/26/20 1850 03/26/20 2024   03/26/20 1900  flucytosine (ANCOBON) capsule 2,000 mg  Status:  Discontinued        25 mg/kg  80 kg Oral Every 6 hours 03/26/20 1850 04/11/20 1052       Time spent: 15- minutes-Greater than 50% of this time was spent in counseling, explanation of diagnosis, planning of further management, and coordination of care.  MEDICATIONS: Scheduled Meds: . azithromycin  500 mg Oral Daily  . dextromethorphan  30 mg Oral BID  . diltiazem  180 mg Oral Daily  . ethambutol  1,200 mg Oral Daily  . fluconazole  800 mg Oral Daily  . pravastatin  20 mg Oral QPM  . rifabutin  300 mg Oral Daily  .  sulfamethoxazole-trimethoprim  1 tablet Oral Once per day on Mon Wed Fri   Continuous Infusions: . sodium chloride     PRN Meds:.acetaminophen **OR** acetaminophen, acetaminophen, diphenhydrAMINE **OR** diphenhydrAMINE, ibuprofen, ondansetron **OR** ondansetron (ZOFRAN) IV, oxyCODONE   PHYSICAL EXAM: Vital signs: Vitals:   04/11/20 2010 04/11/20 2321 04/12/20 0414 04/12/20 0840  BP: (!) 144/100 131/83 127/82 127/80  Pulse: 66 61 63 (!) 59  Resp: _0 Temp: 97.6 F (36.4 C) 97.9 F (36.6 C) 97.9 F (36.6 C) 97.7 F (36.5 C)  TempSrc: Oral Oral Oral Oral  SpO2: 100% 99% 98% 98%  Weight:      Height:       Filed Weights   03/26/20 1839 03/27/20 0141  Weight: 80 kg 80.8 kg   Body mass index is 23.5 kg/m.   Gen Exam:Alert awake-not in any distress HEENT:atraumatic, normocephalic Chest: B/L clear to auscultation anteriorly CVS:S1S2 regular Abdomen:soft non tender, non distended Extremities:no edema Neurology: Non focal Skin: no rash  I have personally reviewed following labs and imaging studies  LABORATORY DATA: CBC: Recent Labs  Lab 04/08/20 0235 04/09/20 0343 04/10/20 0352 04/11/20 0434 04/12/20 0409  WBC 5.2 5.7 4.8 3.9* 3.3*  HGB 12.4* 11.9* 11.4* 11.7* 12.2*  HCT 35.8* 33.4* 32.6* 33.9* 33.9*  MCV 96.0 94.6 95.6 96.3 94.4  PLT 352 324 307 290 675    Basic Metabolic Panel: Recent Labs  Lab 04/08/20 0235 04/09/20 0343 04/10/20 0352 04/11/20 0434 04/12/20 0409  NA 133* 131* 132* 131* 134*  K 4.1 4.3 4.1 4.5 4.6  CL 102 103 103 104 104  CO2 21* 20* 21* 17* 22  GLUCOSE 112* 111* 163* 137* 102*  BUN _0 CREATININE 1.03 1.21 1.04 0.98 1.41*  CALCIUM 9.3 9.0 9.1 9.0 9.4  MG 1.6* 2.1 1.7 2.0 1.8    GFR: Estimated Creatinine Clearance: 61.4 mL/min (A) (by C-G formula based on SCr of 1.41 mg/dL (H)).  Liver Function Tests: No results for input(s): AST, ALT, ALKPHOS, BILITOT, PROT, ALBUMIN in the last 168 hours. No results for  input(s): LIPASE, AMYLASE in the last 168 hours. No results for input(s): AMMONIA in the last 168 hours.  Coagulation Profile: No results for input(s): INR, PROTIME in the last 168 hours.  Cardiac Enzymes: No results for input(s): CKTOTAL, CKMB, CKMBINDEX, TROPONINI in the last 168 hours.  BNP (last 3 results) No results for input(s): PROBNP in the last 8760 hours.  Lipid Profile: No results for input(s): CHOL, HDL, LDLCALC, TRIG, CHOLHDL, LDLDIRECT in the last 72 hours.  Thyroid Function Tests: No results for input(s): TSH, T4TOTAL, FREET4, T3FREE, THYROIDAB in the last 72 hours.  Anemia Panel: No results for input(s): VITAMINB12, FOLATE, FERRITIN, TIBC, IRON, RETICCTPCT in the last 72 hours.  Urine analysis: No results found for: COLORURINE, APPEARANCEUR, LABSPEC, PHURINE, GLUCOSEU, HGBUR, BILIRUBINUR, KETONESUR, PROTEINUR, UROBILINOGEN, NITRITE, LEUKOCYTESUR  Sepsis Labs: Lactic Acid, Venous    Component Value Date/Time   LATICACIDVEN 0.9 03/26/2020 0958    MICROBIOLOGY: Recent Results (from the past 240 hour(s))  CSF culture w Gram Stain     Status: None   Collection Time: 04/02/20  3:00 PM   Specimen: CSF  Result Value Ref Range Status   Specimen Description CSF  Final   Special Requests NONE  Final   Gram Stain   Final    WBC PRESENT, PREDOMINANTLY MONONUCLEAR NO ORGANISMS SEEN CYTOSPIN SMEAR    Culture   Final    NO GROWTH 3 DAYS Performed at [facility], [place] [address], [place], [place] [address]    Report Status 04/05/2020 FINAL  Final  Blastomyces Antigen     Status: None   Collection Time: 04/03/20  4:42 AM   Specimen: Blood  Result Value Ref Range Status   Blastomyces Antigen None Detected None Detected ng/mL Final    Comment: (NOTE) Results reported as ng/mL in 0.2 - 14.7 ng/mL range Results above the limit of detection but below 0.2 ng/mL are reported as 'Positive, Below the Limit of Quantification' Results above 14.7 ng/mL are reported  as 'Positive, Above the Limit of Quantification'    Specimen Type SERUM  Final    Comment: (NOTE) Performed At: [facility] [place], [place] [id number] Bruce Donath MD JO:[id number]   CSF culture w Stat Gram Stain     Status: None (Preliminary result)   Collection Time: 04/10/20  5:18 PM   Specimen: CSF; Cerebrospinal Fluid  Result Value  Ref Range Status   Specimen Description CSF  Final   Special Requests NONE  Final   Gram Stain   Final    WBC PRESENT, PREDOMINANTLY MONONUCLEAR CRYPTOCOCCUS NEOFORMANS CYTOSPIN SMEAR    Culture   Final    NO GROWTH 2 DAYS Performed at [facility], [address], [place], [place] [address]    Report Status PENDING  Incomplete  Culture, fungus without smear     Status: None (Preliminary result)   Collection Time: 04/10/20  5:18 PM   Specimen: CSF; Other  Result Value Ref Range Status   Specimen Description CSF  Final   Special Requests NONE  Final   Culture   Final    NO GROWTH 2 DAYS Performed at [facility], [address] [place], [place] [address]    Report Status PENDING  Incomplete    RADIOLOGY STUDIES/RESULTS: No results found.   LOS: 17 days   Oren Binet, MD  [facility]    To contact the attending provider between 7A-7P or the covering provider during after hours 7P-7A, please log into the web site [email or web address] and access using universal [place] password for that web site. If you do not have the password, please call the hospital operator.  04/12/2020, 11:40 AM

## 2020-04-12 NOTE — Progress Notes (Signed)
[facility]  Date of Admission:  03/26/2020           Reason for visit: Follow up on cryptococcal meningitis  ABX: 3/08-c ethambutol 1200 mg po daily 3/08-c rifabutin 300 mg po daily 2/21-c Amphotericin B 2/21-c Flucytosine 2/21 2/21-c azith (changed from prophy dose weekly to treatment dose daily on 3/08)  TMP-SMX SS daily for PCP prophylaxis; hold 2/28 while on amphotericin  Principal Problem:   Cryptococcal meningitis (HCC) Active Problems:   Leukocytopenia   Macrocytosis   Moderate protein malnutrition (HCC)   Hypoalbuminemia   AIDS (acquired immune deficiency syndrome) (HCC)   Atrial flutter (HCC)   Aortic atherosclerosis (HCC)   Hypertension   SVT (supraventricular tachycardia) (HCC)   Abnormal EKG   Acute nonintractable headache   Smoking   Cocaine use   Cavitary lesion of lung   AKI (acute kidney injury) ([place])   Blurry vision, left eye   Mycobacterium avium infection ([place])   Assessment 65 yo male with hiv/aids here for cryptococcal meningitis  #Cryptococcal meningitis Brain mri no acute pathology/cryptococcoma On induction phase of tx with ampho/flucytosine. Plan 2 weeks at least prior to transitioning to consolidation fluconazole; day 1 = 2/21. Headache improving with LP x3 Last lp 2/28 by neurology: OP 14 Csf culture 2/21 and 2/24 with cryptococcus neoforman Repeat LPL 3/08 culture in progress.  Has chronic right eye blurriness from distant trauma; ophthalmology exam no intraocular/retinal lesion Will transition to high dose fluconazole pending 3/08 csf cx  #nodular cavitary process on chest ct #MAC #fungal stain positive sputum cx Wide ddx in this patient with aids including infectious, hiv related noninfectious ([place]) and malignancy Unclear prior TB process if was cavitary/nodular as well 2/23 sputum fungal cx ngtd -- stain showed fungal element 2/23 and 2/24 afb sputum cx -- MAC; stain afb positive --> will start  full tx dose 3/08 with azith and ethambutol and rifabutin (given severely depressed cd4 and likely high burden of disease) No quantiferon gold sent given prior hx tb and afb cx already cooking Urine histoplasma ag negative; blastomyces ag negative Mucor and aspergillus infection in hiv is rather low in prevalence/incidence. As endemic fungi and fungal cx remains negative and he appears well, will hold empiric antifungal  #AIDS/HIV disease Long dx; [name]; previously seeking care in [place], [name]; vl at dx 35k Delaying early ART in setting cryptococcal meningitis -- if 2week LP is negative, will start ART in 1-2 weeks given multiple other OI    #AKI aki resolved Now off amphotericin, restarted bactrim prophy for pjp  #History of TB Treated through the [facility] health department, completed treatment April 2001 after 29 weeks  #hcm Prior hep b infection, cleared hcv ab nonreactiev   Recommendation -continue prophy bactrim ds tiw -3/08 csf cx ngtd (stain positive could be just dead cryptococcus); continue high dose fluconazole 888m pending final csf culture -if csf cx negative for cryptococcus on 3/08, will transition to consolidation therapy fluconazole 400 mg -continue azithromycin 500 mg daily; ethambutol 173mkg daily; rifabutin 300 mg po daily -plan to start biktarvy in 1-2 weeks from 04/10/2020 if csf cx sterilized    I spent more than 30 minute reviewing data/chart, and coordinating care and >50% direct face to face time providing counseling/discussing diagnostics/treatment plan with patient  --------------------------------- Subjective Afebrile No n/v No headache Stable right eye blurriness  Tolerating azith/ethambutol/rifabutin and since yesterday fluconazole   Objective Blood pressure 127/80, pulse (!) 59, temperature 97.7 F (36.5 C), temperature source Oral, resp. rate 16,  height _0  (1.854 m), weight 80.8 kg, SpO2 98 %. Body mass index is 23.5 kg/m.  Physical Exam  General: Alert, cooperative, [name], non-tender, [name], clubbing, [name], nonfocal   Lab Results: Lab Results  Component Value Date   WBC 3.3 (L) 04/12/2020   HGB 12.2 (L) 04/12/2020   HCT 33.9 (L) 04/12/2020   MCV 94.4 04/12/2020   PLT 278 04/12/2020    Lab Results  Component Value Date   NA 134 (L) 04/12/2020   K 4.6 04/12/2020   CO2 22 04/12/2020   GLUCOSE 102 (H) 04/12/2020   BUN 20 04/12/2020   CREATININE 1.41 (H) 04/12/2020   CALCIUM 9.4 04/12/2020   GFRNONAA 56 (L) 04/12/2020    Lab Results  Component Value Date   ALT 17 03/31/2020   AST 24 03/31/2020   ALKPHOS 100 03/31/2020   BILITOT 0.8 03/31/2020    No results found for: CRP     Component Value Date/Time   ESRSEDRATE 30 (H) 03/26/2020 0958     Serology: Histo/blasto serology negative 2/24 csf CrAg titer 1:640 2/21 csf vdrl, hsv pcr negative 2/21 csf CrAg titer 1:2560  HIV            VL   /   CD4 (%) 02/22        25k   /    <35 (2)  HIV genotype pending from 2/24  Micro: 3/08 csf cx ngtd (stain crypto) 2/26 (& 2/22, 23, 25) sputum cx not acceptable 2/24 csf cx cryptococcus neoforman; fungal cx negative 2/24 sputum afb smear positive; cx MAC 2/24 sputum fungal stain negative; fungal cx in progress 2/23 sputum afb smear positive; cx ngtd 2/23 sputum fungal stain positive (fungal element); cx ngtd 2/21 csf bacterial cx cryptococcus neoforman; fungal cx negative     Imaging: I have personally reviewed imaging/results in epic  2/21 brain mri 1. No evidence of acute nonvascular abnormality. 2. The area of abnormality seen on same-day CTV is poorly characterized on this study  secondary to motion and flow related artifact. While the appearance on the CTV was atypical in appearance/morphology for acute thrombus, a follow up CTV in approximately one week could evaluate for change if the patient's symptoms do not improve or worsen.  2/24 ct head No acute process; [name], [name], possibly reactive. Again, [name], as clinically indicated. 4. Areas of volume loss and bronchiectasis in the left upper and left lower lobes may be due to resolving infection/post infectious scarring. 5. Chronic calcific pancreatitis. 6.  Aortic atherosclerosis 7.  Emphysema  [facility] [phone] pager 04/12/2020, 9:56 AM

## 2020-04-12 NOTE — Plan of Care (Signed)

## 2020-04-13 ENCOUNTER — Inpatient Hospital Stay (HOSPITAL_COMMUNITY): Payer: Medicaid Other

## 2020-04-13 DIAGNOSIS — B451 Cerebral cryptococcosis: Secondary | ICD-10-CM | POA: Diagnosis not present

## 2020-04-13 DIAGNOSIS — B20 Human immunodeficiency virus [HIV] disease: Secondary | ICD-10-CM | POA: Diagnosis not present

## 2020-04-13 DIAGNOSIS — A310 Pulmonary mycobacterial infection: Secondary | ICD-10-CM | POA: Diagnosis not present

## 2020-04-13 DIAGNOSIS — J984 Other disorders of lung: Secondary | ICD-10-CM | POA: Diagnosis not present

## 2020-04-13 LAB — MAGNESIUM: Magnesium: 1.8 mg/dL (ref 1.7–2.4)

## 2020-04-13 LAB — CBC
HCT: 34.6 % — ABNORMAL LOW (ref 39.0–52.0)
Hemoglobin: 12 g/dL — ABNORMAL LOW (ref 13.0–17.0)
MCH: 33.5 pg (ref 26.0–34.0)
MCHC: 34.7 g/dL (ref 30.0–36.0)
MCV: 96.6 fL (ref 80.0–100.0)
Platelets: 282 10*3/uL (ref 150–400)
RBC: 3.58 MIL/uL — ABNORMAL LOW (ref 4.22–5.81)
RDW: 11.5 % (ref 11.5–15.5)
WBC: 2.8 10*3/uL — ABNORMAL LOW (ref 4.0–10.5)
nRBC: 0 % (ref 0.0–0.2)

## 2020-04-13 LAB — REFLEX TO GENOSURE(R) MG EDI: HIV GenoSure(R): 1

## 2020-04-13 LAB — HIV GENOSURE(R) MG

## 2020-04-13 LAB — URINALYSIS, ROUTINE W REFLEX MICROSCOPIC
Bilirubin Urine: NEGATIVE
Glucose, UA: NEGATIVE mg/dL
Hgb urine dipstick: NEGATIVE
Ketones, ur: NEGATIVE mg/dL
Leukocytes,Ua: NEGATIVE
Nitrite: NEGATIVE
Protein, ur: NEGATIVE mg/dL
Specific Gravity, Urine: 1.009 (ref 1.005–1.030)
pH: 5 (ref 5.0–8.0)

## 2020-04-13 LAB — GENOSURE INTEGRASE HIV EDI: HIV Genosure Integrase PDF 2: 1

## 2020-04-13 LAB — HIV GENOSURE REFLEX - HIVGTY - ELECTRONIC RECORD

## 2020-04-13 LAB — BASIC METABOLIC PANEL
Anion gap: 6 (ref 5–15)
BUN: 23 mg/dL (ref 8–23)
CO2: 23 mmol/L (ref 22–32)
Calcium: 9.3 mg/dL (ref 8.9–10.3)
Chloride: 105 mmol/L (ref 98–111)
Creatinine, Ser: 1.61 mg/dL — ABNORMAL HIGH (ref 0.61–1.24)
GFR, Estimated: 48 mL/min — ABNORMAL LOW (ref >60)
Glucose, Bld: 117 mg/dL — ABNORMAL HIGH (ref 70–99)
Potassium: 5.1 mmol/L (ref 3.5–5.1)
Sodium: 134 mmol/L — ABNORMAL LOW (ref 135–145)

## 2020-04-13 LAB — HIV-1 RNA, PCR (GRAPH) RFX/GENO EDI
HIV-1 RNA BY PCR: 68400 copies/mL
HIV-1 RNA Quant, Log: 4.835 log10copy/mL

## 2020-04-13 IMAGING — US US RENAL
1 series · 14 of 25 positions shown · non-contrast
Comparison: None.

CLINICAL DATA: Acute kidney injury.

EXAM:
RENAL / URINARY TRACT ULTRASOUND COMPLETE

[Series 1: us renal · 14 of 34 slices shown]
[im 1/34]
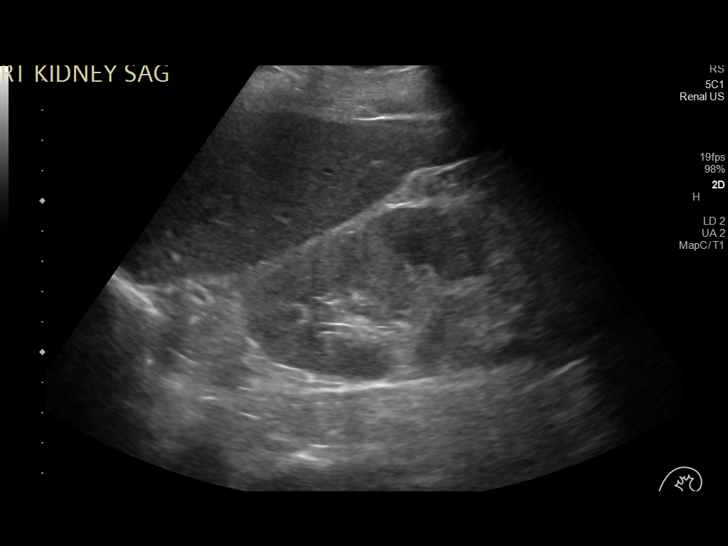
[im 3/34]
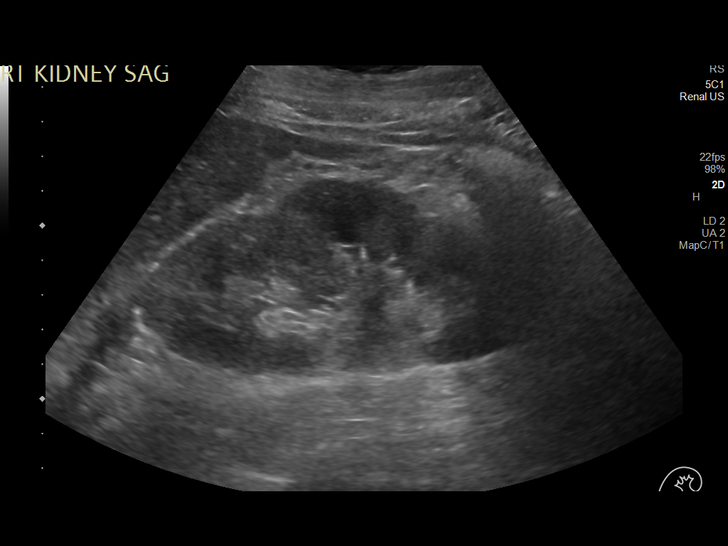
[im 6/34]
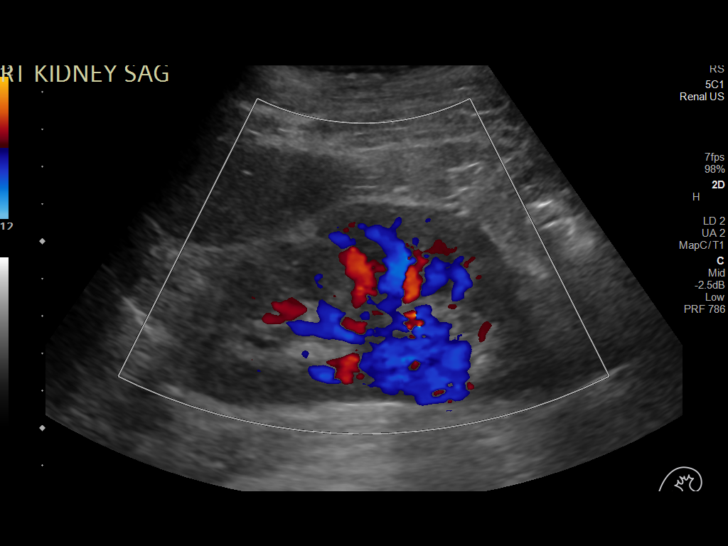
[im 9/34]
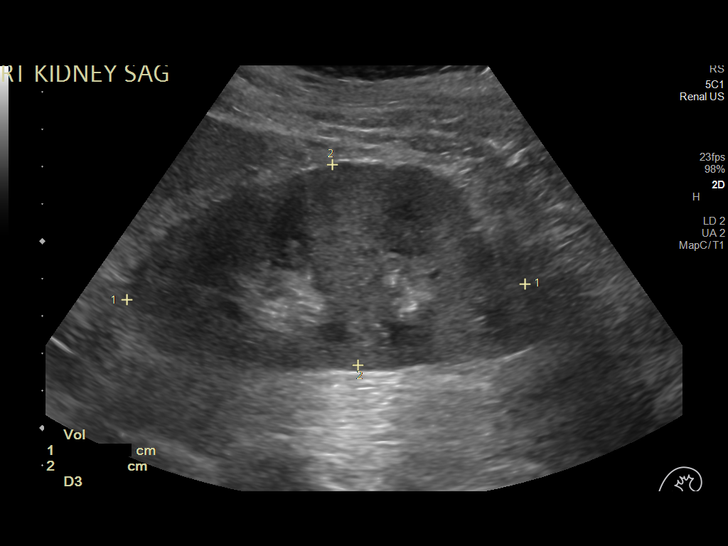
[im 12/34]
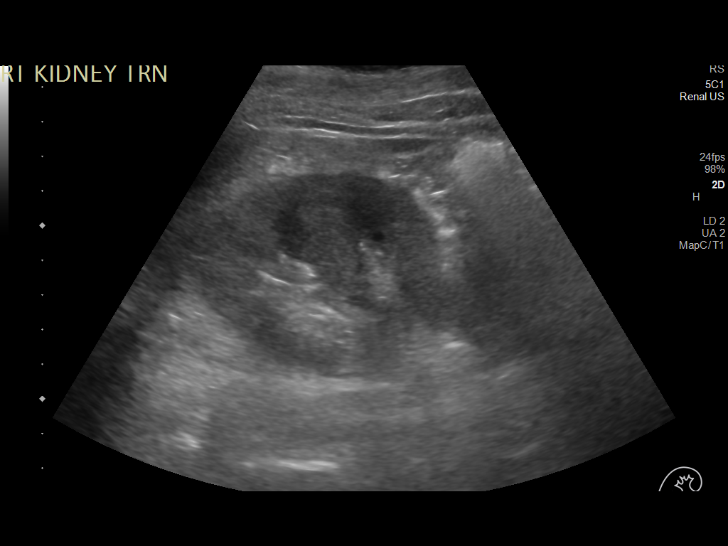
[im 13/34]
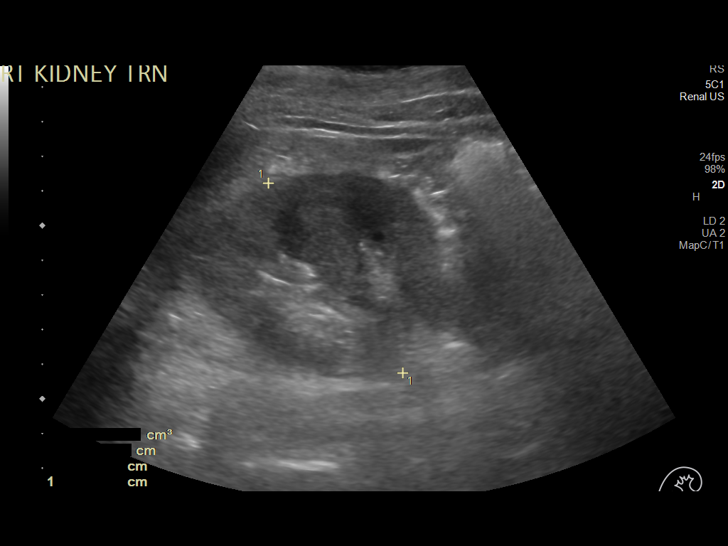
[im 16/34]
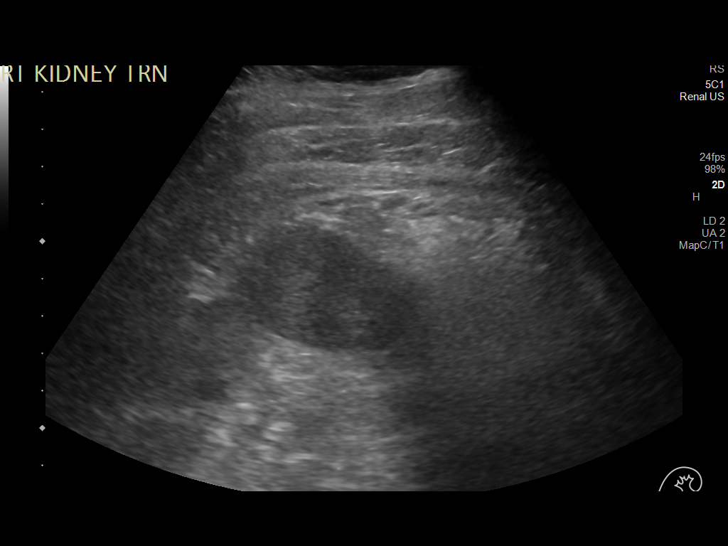
[im 18/34]
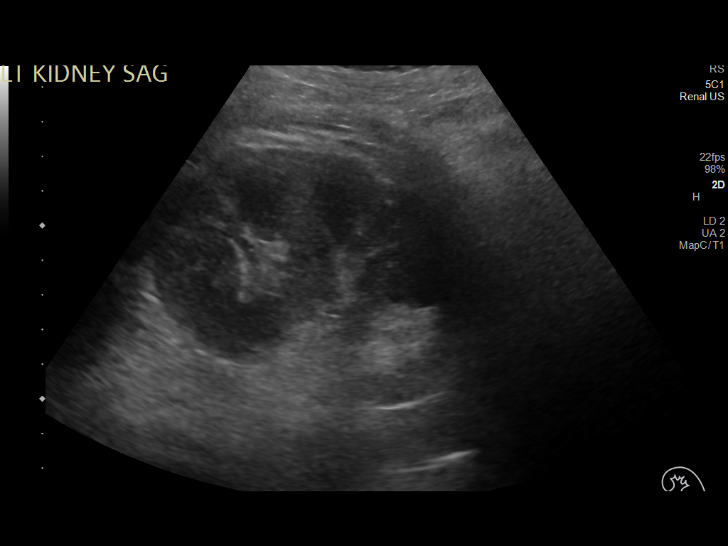
[im 21/34]
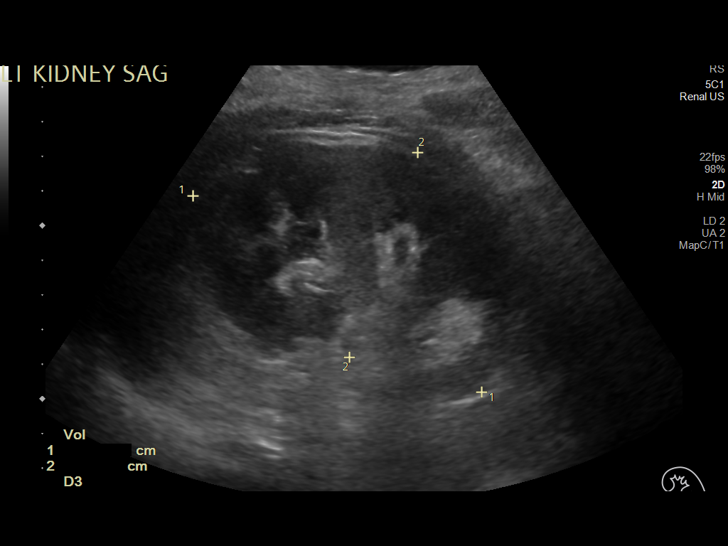
[im 23/34]
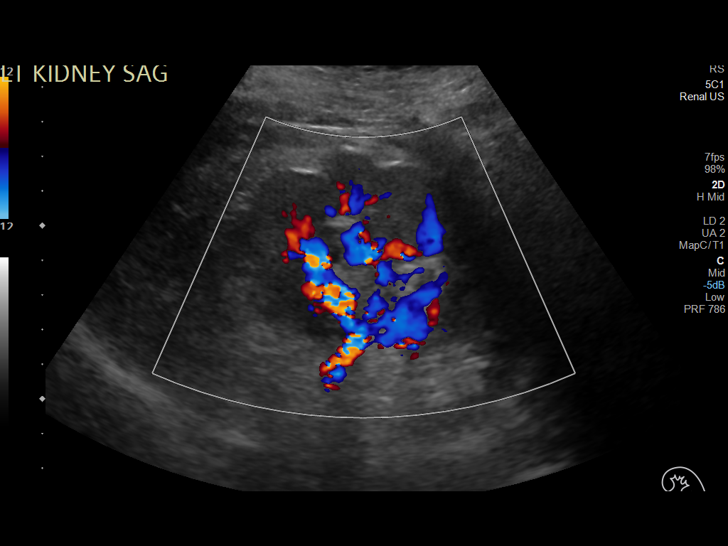
[im 25/34]
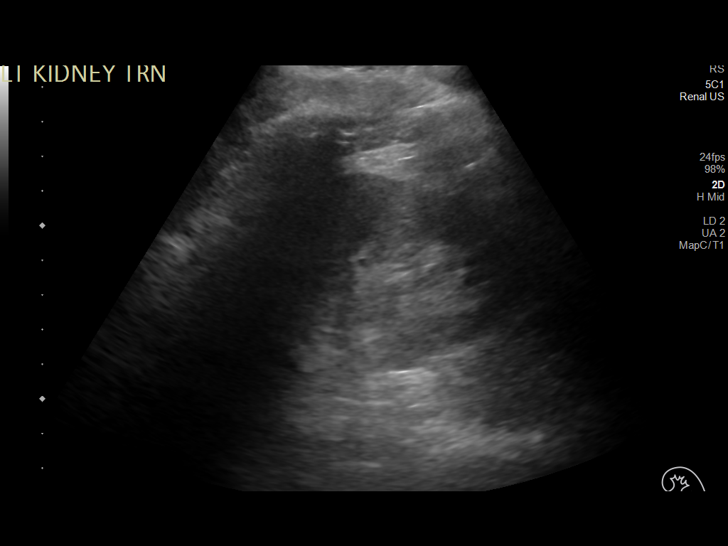
[im 28/34]
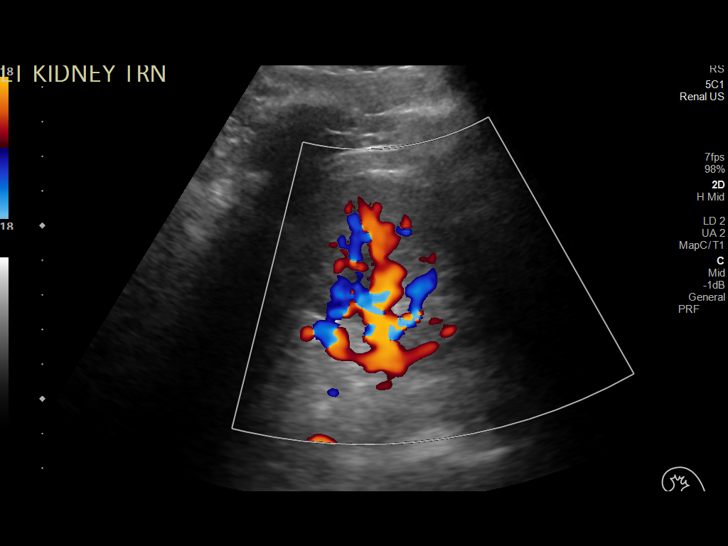
[im 31/34]
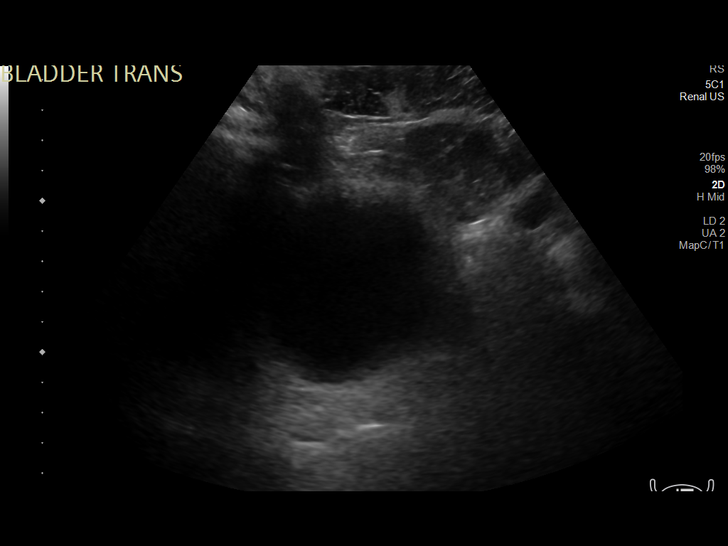
[im 34/34]
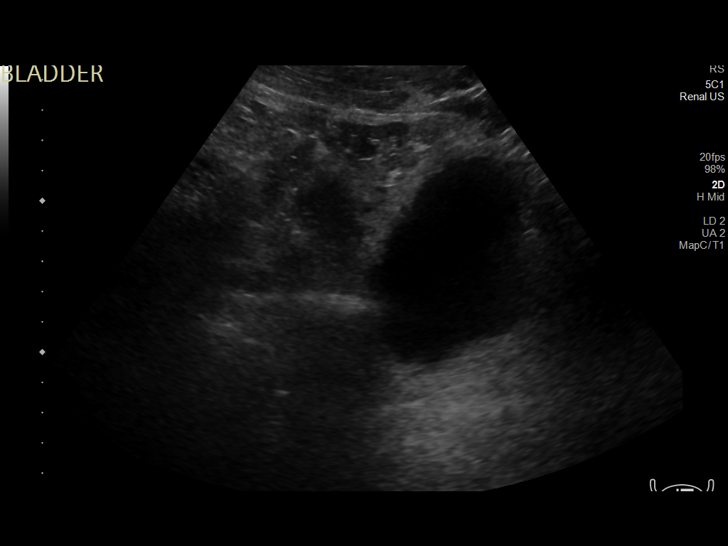

[14 of 25 positions shown; findings below may reference images not displayed]

FINDINGS: Right Kidney:

Renal measurements: 10.7 x 5.4 x 6.7 cm = volume: 203 mL. Borderline
increased renal parenchymal echogenicity. Renal contours are
lobulated. No hydronephrosis. No visualized calculi or focal renal
lesion.

Left Kidney:

Renal measurements: 10.1 x 6.2 x 6.0 cm = volume: 197 mL. Borderline
increased renal parenchymal echogenicity. Lobulated renal contours.
No hydronephrosis. No visualized stone or focal lesion.

Bladder:

Appears normal for degree of bladder distention.

Other:

None.
IMPRESSION: 1. No obstructive uropathy.
2. Lobulated renal contours with borderline increased parenchymal
echogenicity suggesting mild chronic medical renal disease.

## 2020-04-13 MED ORDER — DIPHENHYDRAMINE HCL 50 MG/ML IJ SOLN: 25.0000 mg | Freq: Four times a day (QID) | INTRAMUSCULAR | Status: DC | PRN

## 2020-04-13 MED ORDER — MAGNESIUM SULFATE IN D5W 1-5 GM/100ML-% IV SOLN
1.0000 g | Freq: Once | INTRAVENOUS | Status: AC
  Administered 2020-04-13: 1 g via INTRAVENOUS
  Filled 2020-04-13: qty 100

## 2020-04-13 MED ORDER — SODIUM CHLORIDE 0.9 % IV SOLN: INTRAVENOUS | Status: AC

## 2020-04-13 MED ORDER — CLOBETASOL PROPIONATE 0.05 % EX CREA
TOPICAL_CREAM | Freq: Two times a day (BID) | CUTANEOUS | Status: DC
  Administered 2020-04-20: 1 via TOPICAL
  Filled 2020-04-13 (×3): qty 15

## 2020-04-13 MED ORDER — DIPHENHYDRAMINE HCL 25 MG PO CAPS
25.0000 mg | ORAL_CAPSULE | Freq: Four times a day (QID) | ORAL | Status: DC | PRN
  Administered 2020-04-14: 25 mg via ORAL
  Filled 2020-04-13: qty 1

## 2020-04-13 MED ORDER — MAGNESIUM SULFATE 2 GM/50ML IV SOLN
2.0000 g | Freq: Once | INTRAVENOUS | Status: AC
  Administered 2020-04-13: 2 g via INTRAVENOUS
  Filled 2020-04-13: qty 50

## 2020-04-13 NOTE — Plan of Care (Signed)

## 2020-04-13 NOTE — Progress Notes (Signed)
Telemetry called to report pt having pauses, 2.23secs and 2.17secs but pt remained asymptomatic watching TV in bed. MD on call paged and notified. New order received and MD requested to be notified if pauses hit 4secs or higher. Will continue to closely monitor pt. Delia Heady RN

## 2020-04-13 NOTE — Progress Notes (Signed)
Pt c/o itching to arms and legs, prn benadryl was ineffective and MD on call was notified. New order received. Delia Heady RN

## 2020-04-13 NOTE — Progress Notes (Signed)
PROGRESS NOTE        PATIENT DETAILS Name: Ricky [name] Age: 65 y.o. Sex: male Date of Birth: August 03, 1955 Admit Date: 03/26/2020 Admitting Physician Reubin Milan, MD PCP:Pcp, No  Brief Narrative: Patient is a 65 y.o. 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 2/22>> CT chest with contrast: Nodular/cavitary airspace consolidation in the left upper/lower lobes 2/22>> Echo: EF 50-55% 2/24>> CT head: No acute intracranial process 2/24>> nuclear stress test: No ischemia  Antimicrobial therapy: Amphotericin/flucytosine: 2/21>>3/9 Ethambutol: 3/8>> Rifabutin: 3/8>> Zithromax: 3/8>> Fluconazole: 3/9>>  Microbiology data: 2/21>> CSF culture: Cryptococcus neoformans 2/23>> sputum AFB smear negative 2/23>> sputum AFB DNA probe: Positive for M avium complex 2/24>> sputum AFB smear: Negative 2/24>> CSF culture: Cryptococcus neoformans 2/28>> CSF culture: No growth 3/8>> CSF Gram stain: Cryptococcus neoformans 3/8>> CSF culture: No growth  Procedures : LP>> 2/22, 2/24, 2/28, 3/8  Consults: Neurology, cardiology Terri Skains), ID, ophthalmology  DVT Prophylaxis : SCDs Start: 03/26/20 1946   Subjective: Lying comfortably in bed-no major issues overnight.  Assessment/Plan: Cryptococcal meningitis: Overall improved-completed a course of IV amphotericin/flucytosine-has been transitioned to fluconazole.  Await final culture results of LP from 3/8.  Discussed with ID-Dr. Johnny Bridge on 3/10-recommending continued inpatient until 3/14-to ensure that CSF cultures are negative before  consideration of discharge.    AKI: Probably secondary to-?  Bactrim-check UA-renal ultrasound-continue IVF-if renal failure persists-we will discuss with ID tomorrow.    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  Histoplasma/blastomycosis antigen negative.   HIV/AIDS: Noncompliant-timing for initiation of ART defer to ID.  SVT: Continue telemetry monitoring-on Cardizem-now with transient second-degree heart block-await cardiology eval.  Nuclear stress test negative-echo with preserved EF.  TSH within normal limit  Recent COVID-19 infection: Asymptomatic-out of quarantine window.  Chronic right eye blurriness: Evaluated by ophthalmology on 3/7-no evidence of infection.  History of tuberculosis-apparently treated at the [facility] and April 2001   Lab Results  Component Value Date   [place] (A) 03/03/2020    Chronic calcific pancreatitis: Seen incidentally on CT chest  Moderate protein calorie malnutrition  History of cocaine use  Tobacco abuse   Diet: Diet Order            Diet Heart Room service appropriate? Yes; Fluid consistency: Thin  Diet effective now                  Code Status: Full code  Family Communication: Will reach out to family over the next few days.  Disposition Plan: Status is: Inpatient  Remains inpatient appropriate because:Inpatient level of care appropriate due to severity of illness   Dispo:  Patient From: Home  Planned Disposition: Home  Medically stable for discharge: No     Barriers to Discharge: Cryptococcal meningitis-awaiting CSF cultures to be final before consideration of discharge-Per discussion with ID on 3/10-potential discharge on 3/14.  Antimicrobial agents: Anti-infectives (From admission, onward)   Start     Dose/Rate Route Frequency Ordered Stop   04/11/20 1815  sulfamethoxazole-trimethoprim (BACTRIM DS) 800-160 MG per  tablet 1 tablet  1 tablet Oral Once per day on Mon Wed Fri 04/11/20 1722     04/11/20 1200  fluconazole (DIFLUCAN) tablet 800 mg        800 mg Oral Daily 04/11/20 1052     04/11/20 1000  azithromycin (ZITHROMAX) tablet 500 mg        500 mg Oral Daily 04/10/20 1038     04/10/20 1130  ethambutol (MYAMBUTOL) tablet 1,200 mg        1,200 mg Oral Daily 04/10/20 1038     04/10/20 1130  rifabutin (MYCOBUTIN) capsule 300 mg        300 mg Oral Daily 04/10/20 1038     03/28/20 1000  azithromycin (ZITHROMAX) tablet 1,200 mg  Status:  Discontinued        1,200 mg Oral Weekly 03/28/20 0912 04/10/20 1038   03/28/20 1000  sulfamethoxazole-trimethoprim (BACTRIM) 400-80 MG per tablet 1 tablet  Status:  Discontinued        1 tablet Oral Daily 03/28/20 0912 04/02/20 0919   03/27/20 2115  amphotericin B liposome (AMBISOME) 300 mg in dextrose 5 % 500 mL IVPB  Status:  Discontinued        300 mg 287.5 mL/hr over 120 Minutes Intravenous Every 24 hours 03/26/20 2024 03/26/20 2227   03/26/20 2227  amphotericin B liposome (AMBISOME) 300 mg in dextrose 5 % 500 mL IVPB  Status:  Discontinued        300 mg 287.5 mL/hr over 120 Minutes Intravenous Every 24 hours 03/26/20 2227 04/11/20 1052   03/26/20 1945  amphotericin B liposome (AMBISOME) 300 mg in dextrose 5 % 500 mL IVPB  Status:  Discontinued        300 mg 287.5 mL/hr over 120 Minutes Intravenous Every 24 hours 03/26/20 1850 03/26/20 2024   03/26/20 1900  flucytosine (ANCOBON) capsule 2,000 mg  Status:  Discontinued        25 mg/kg  80 kg Oral Every 6 hours 03/26/20 1850 04/11/20 1052       Time spent: 15- minutes-Greater than 50% of this time was spent in counseling, explanation of diagnosis, planning of further management, and coordination of care.  MEDICATIONS: Scheduled Meds: . azithromycin  500 mg Oral Daily  . clobetasol cream   Topical BID  . dextromethorphan  30 mg Oral BID  . diltiazem  180 mg Oral Daily  . ethambutol  1,200 mg Oral  Daily  . fluconazole  800 mg Oral Daily  . pravastatin  20 mg Oral QPM  . rifabutin  300 mg Oral Daily  . sulfamethoxazole-trimethoprim  1 tablet Oral Once per day on Mon Wed Fri   Continuous Infusions: . sodium chloride    . magnesium sulfate bolus IVPB     PRN Meds:.acetaminophen **OR** acetaminophen, acetaminophen, diphenhydrAMINE **OR** diphenhydrAMINE, ibuprofen, ondansetron **OR** ondansetron (ZOFRAN) IV, oxyCODONE   PHYSICAL EXAM: Vital signs: Vitals:   04/12/20 1954 04/12/20 2337 04/13/20 0324 04/13/20 0824  BP: 134/90 135/81 112/71 116/81  Pulse: (!) 59 (!) 57 (!) 54 (!) 53  Resp: _0 Temp: 97.6 F (36.4 C) (!) 97.5 F (36.4 C) 98.2 F (36.8 C) (!) 97.4 F (36.3 C)  TempSrc: Oral Axillary Axillary Oral  SpO2: 100% 100% 99% 99%  Weight:      Height:       Filed Weights   03/26/20 1839 03/27/20 0141  Weight: 80 kg 80.8 kg   Body mass index is 23.5 kg/m.   Gen Exam:Alert awake-not in  any distress HEENT:atraumatic, normocephalic Chest: B/L clear to auscultation anteriorly CVS:S1S2 regular Abdomen:soft non tender, non distended Extremities:no edema Neurology: Non focal Skin: no rash  I have personally reviewed following labs and imaging studies  LABORATORY DATA: CBC: Recent Labs  Lab 04/09/20 0343 04/10/20 0352 04/11/20 0434 04/12/20 0409 04/13/20 0411  WBC 5.7 4.8 3.9* 3.3* 2.8*  HGB 11.9* 11.4* 11.7* 12.2* 12.0*  HCT 33.4* 32.6* 33.9* 33.9* 34.6*  MCV 94.6 95.6 96.3 94.4 96.6  PLT 324 307 290 278 332    Basic Metabolic Panel: Recent Labs  Lab 04/09/20 0343 04/10/20 0352 04/11/20 0434 04/12/20 0409 04/13/20 0411  NA 131* 132* 131* 134* 134*  K 4.3 4.1 4.5 4.6 5.1  CL 103 103 104 104 105  CO2 20* 21* 17* 22 23  GLUCOSE 111* 163* 137* 102* 117*  BUN _0 CREATININE 1.21 1.04 0.98 1.41* 1.61*  CALCIUM 9.0 9.1 9.0 9.4 9.3  MG 2.1 1.7 2.0 1.8 1.8    GFR: Estimated Creatinine Clearance: 53.8 mL/min (A) (by C-G  formula based on SCr of 1.61 mg/dL (H)).  Liver Function Tests: No results for input(s): AST, ALT, ALKPHOS, BILITOT, PROT, ALBUMIN in the last 168 hours. No results for input(s): LIPASE, AMYLASE in the last 168 hours. No results for input(s): AMMONIA in the last 168 hours.  Coagulation Profile: No results for input(s): INR, PROTIME in the last 168 hours.  Cardiac Enzymes: No results for input(s): CKTOTAL, CKMB, CKMBINDEX, TROPONINI in the last 168 hours.  BNP (last 3 results) No results for input(s): PROBNP in the last 8760 hours.  Lipid Profile: No results for input(s): CHOL, HDL, LDLCALC, TRIG, CHOLHDL, LDLDIRECT in the last 72 hours.  Thyroid Function Tests: No results for input(s): TSH, T4TOTAL, FREET4, T3FREE, THYROIDAB in the last 72 hours.  Anemia Panel: No results for input(s): VITAMINB12, FOLATE, FERRITIN, TIBC, IRON, RETICCTPCT in the last 72 hours.  Urine analysis: No results found for: COLORURINE, APPEARANCEUR, LABSPEC, PHURINE, GLUCOSEU, HGBUR, BILIRUBINUR, KETONESUR, PROTEINUR, UROBILINOGEN, NITRITE, LEUKOCYTESUR  Sepsis Labs: Lactic Acid, Venous    Component Value Date/Time   LATICACIDVEN 0.9 03/26/2020 0958    MICROBIOLOGY: Recent Results (from the past 240 hour(s))  CSF culture w Stat Gram Stain     Status: None (Preliminary result)   Collection Time: 04/10/20  5:18 PM   Specimen: CSF; Cerebrospinal Fluid  Result Value Ref Range Status   Specimen Description CSF  Final   Special Requests NONE  Final   Gram Stain   Final    WBC PRESENT, PREDOMINANTLY MONONUCLEAR CORRECTED RESULTS NO ORGANISMS SEEN PREVIOUSLY REPORTED AS: CRYPTOCOCCUS NEOFORMANS CYTOSPIN SMEAR CRITICAL RESULT CALLED TO, READ BACK BY AND VERIFIED WITH: RN Ethlyn Daniels [phone] FCP    Culture   Final    NO GROWTH 2 DAYS Performed at [facility], [place] [address] [place], [place] [address]    Report Status PENDING  Incomplete  Culture, fungus without smear     Status: None  (Preliminary result)   Collection Time: 04/10/20  5:18 PM   Specimen: CSF; Other  Result Value Ref Range Status   Specimen Description CSF  Final   Special Requests NONE  Final   Culture   Final    NO FUNGUS ISOLATED AFTER 3 DAYS Performed at [facility], [address] [place], [place] [address]    Report Status PENDING  Incomplete    RADIOLOGY STUDIES/RESULTS: No results found.   LOS: 18  days   Oren Binet, MD  [facility]    To contact the attending provider between 7A-7P or the covering provider during after hours 7P-7A, please log into the web site [email or web address] and access using universal [place] password for that web site. If you do not have the password, please call the hospital operator.  04/13/2020, 12:08 PM

## 2020-04-13 NOTE — Progress Notes (Signed)
[facility]  Date of Admission:  03/26/2020           Reason for visit: Follow up on cryptococcal meningitis  ABX: 3/10-c fluconazole 800 mg po daily 3/08-c ethambutol 1200 mg po daily 3/08-c rifabutin 300 mg po daily 2/21-c azith (changed from prophy dose weekly to treatment dose daily on 3/08) TMP-SMX ds PCP prophylaxis; hold 2/28 while on amphotericin; restarted 3/09   2/21-3/10 Amphotericin B 2/21-3/10 Flucytosine 2/21    Principal Problem:   Cryptococcal meningitis (HCC) Active Problems:   Leukocytopenia   Macrocytosis   Moderate protein malnutrition (HCC)   Hypoalbuminemia   AIDS (acquired immune deficiency syndrome) (HCC)   Atrial flutter (HCC)   Aortic atherosclerosis (HCC)   Hypertension   SVT (supraventricular tachycardia) (HCC)   Abnormal EKG   Acute nonintractable headache   Smoking   Cocaine use   Cavitary lesion of lung   AKI (acute kidney injury) ([place])   Blurry vision, left eye   Mycobacterium avium infection ([place])   Assessment 65 yo male with hiv/aids here for cryptococcal meningitis  #Cryptococcal meningitis Brain mri no acute pathology/cryptococcoma S/p induction phase, started fluconazole 3/10 high dose awaiting culture result Csf culture 2/21 and 2/24 with cryptococcus neoforman Repeat LPL 3/08 culture in progress; stain reported as crypto but corrected to no organism Has chronic right eye blurriness from distant trauma; ophthalmology exam no intraocular/retinal lesion Plan to transition to fluconazole consolidation 400 mg once 3/08 csf cx confirmed negative  #nodular cavitary process on chest ct #MAC #fungal stain positive sputum cx Wide ddx in this patient with aids including infectious, hiv related noninfectious ([place]) and malignancy Unclear prior TB process if was cavitary/nodular as well 2/23 sputum fungal cx ngtd -- stain showed fungal element unlikely IFI present 2/23 and 2/24 afb sputum cx --  MAC; stain afb positive --> will start full tx dose 3/08 with azith and ethambutol and rifabutin (given severely depressed cd4 and likely high burden of disease) No quantiferon gold sent given prior hx tb and afb cx already cooking Urine histoplasma ag negative; blastomyces ag negative Mucor and aspergillus infection in hiv is rather low in prevalence/incidence. As endemic fungi and fungal cx remains negative and he appears well, will hold empiric antifungal  #AIDS/HIV disease Long dx; [name]; previously seeking care in [place], [name]; vl at dx 35k Delaying early ART in setting cryptococcal meningitis -- if 2week LP is negative, plan to restart biktarvy once cns cx sterilized after induction therapy Restarted bactrim pjp prophy here   #AKI aki resolved off amphotericin, restarted bactrim prophy for pjp  #History of TB Treated through the [facility], completed treatment April 2001 after 29 weeks  #hcm Prior hep b infection, cleared hcv ab nonreactiev   Recommendation -continue prophy bactrim ds tiw -3/08 csf cx ngtd (stain positive could be just dead cryptococcus); continue high dose fluconazole 851m pending final csf culture -csf cx for crypto typically result in around a week: on 3/14 if 3/08 csf cx remains negative for cryptococcus, will transition to consolidation therapy fluconazole 400 mg  -continue azithromycin 500 mg daily; ethambutol 114mkg daily; rifabutin 300 mg po daily -plan to start biktarvy in 1 weeks from 04/10/2020 if csf cx sterilized  -from id standpoint, if no other complication and patient transitioned to consolidation therapy for cns crypto and starting biktarvy, can  discharge with id f/u   -id f/u with dr Gale Journey @ 330 on 3/22 at [facility] clinic  Address: [address], [place], [place] [address] Phone: [phone]   I spent more than 30 minute  reviewing data/chart, and coordinating care and >50% direct face to face time providing counseling/discussing diagnostics/treatment plan with patient  --------------------------------- Subjective Afebrile No n/v/rash/diarrhea No headache Stable right eye blurriness    Objective Blood pressure 116/81, pulse (!) 53, temperature (!) 97.4 F (36.3 C), temperature source Oral, resp. rate 16, height _0  (1.854 m), weight 80.8 kg, [name], cooperative, [name], non-tender, [name], clubbing, [name], nonfocal   Lab Results: Lab Results  Component Value Date   WBC 2.8 (L) 04/13/2020   HGB 12.0 (L) 04/13/2020   HCT 34.6 (L) 04/13/2020   MCV 96.6 04/13/2020   PLT 282 04/13/2020    Lab Results  Component Value Date   NA 134 (L) 04/13/2020   K 5.1 04/13/2020   CO2 23 04/13/2020   GLUCOSE 117 (H) 04/13/2020   BUN 23 04/13/2020   CREATININE 1.61 (H) 04/13/2020   CALCIUM 9.3 04/13/2020   GFRNONAA 48 (L) 04/13/2020    Lab Results  Component Value Date   ALT 17 03/31/2020   AST 24 03/31/2020   ALKPHOS 100 03/31/2020   BILITOT 0.8 03/31/2020    No results found for: CRP     Component Value Date/Time   ESRSEDRATE 30 (H) 03/26/2020 0958     Serology: Histo/blasto serology negative 2/24 csf CrAg titer 1:640 2/21 csf vdrl, hsv pcr negative 2/21 csf CrAg titer 1:2560  HIV            VL   /   CD4 (%) 02/22        25k   /    <35 (2)  HIV genotype pending from 2/24  Micro: 3/08 csf cx ngtd (stain corrected to no organism) 2/26 (& 2/22, 23, 25) sputum cx not acceptable 2/24 csf cx cryptococcus neoforman; fungal cx negative 2/24 sputum afb smear positive; cx MAC 2/24 sputum fungal stain negative;  fungal cx in progress 2/23 sputum afb smear positive; cx ngtd 2/23 sputum fungal stain positive (fungal element); cx ngtd 2/21 csf bacterial cx cryptococcus neoforman; fungal cx negative     Imaging: I have personally reviewed imaging/results in epic  2/21 brain mri 1. No evidence of acute nonvascular abnormality. 2. The area of abnormality seen on same-day CTV is poorly characterized on this study secondary to motion and flow related artifact. While the appearance on the CTV was atypical in appearance/morphology for acute thrombus, a follow up CTV in approximately one week could evaluate for change if the patient's symptoms do not improve or worsen.  2/24 ct head No acute process; [name], [name], possibly reactive. Again, [name], as clinically indicated. 4. Areas of volume loss and bronchiectasis in the left upper and left  lower lobes may be due to resolving infection/post infectious scarring. 5. Chronic calcific pancreatitis. 6.  Aortic atherosclerosis 7.  Emphysema  [facility] [phone] pager 04/13/2020, 9:56 AM

## 2020-04-14 LAB — MAGNESIUM: Magnesium: 2.1 mg/dL (ref 1.7–2.4)

## 2020-04-14 LAB — BASIC METABOLIC PANEL
Anion gap: 5 (ref 5–15)
BUN: 24 mg/dL — ABNORMAL HIGH (ref 8–23)
CO2: 24 mmol/L (ref 22–32)
Calcium: 9.4 mg/dL (ref 8.9–10.3)
Chloride: 102 mmol/L (ref 98–111)
Creatinine, Ser: 1.46 mg/dL — ABNORMAL HIGH (ref 0.61–1.24)
GFR, Estimated: 54 mL/min — ABNORMAL LOW (ref >60)
Glucose, Bld: 107 mg/dL — ABNORMAL HIGH (ref 70–99)
Potassium: 5.4 mmol/L — ABNORMAL HIGH (ref 3.5–5.1)
Sodium: 131 mmol/L — ABNORMAL LOW (ref 135–145)

## 2020-04-14 LAB — CSF CULTURE W GRAM STAIN: Culture: NO GROWTH

## 2020-04-14 MED ORDER — SODIUM ZIRCONIUM CYCLOSILICATE 10 G PO PACK
10.0000 g | PACK | Freq: Two times a day (BID) | ORAL | Status: AC
  Administered 2020-04-14 (×2): 10 g via ORAL
  Filled 2020-04-14 (×3): qty 1

## 2020-04-14 NOTE — Progress Notes (Signed)
PROGRESS NOTE        PATIENT DETAILS Name: Ricky [name] Age: 65 y.o. Sex: male Date of Birth: 1955/08/10 Admit Date: 03/26/2020 Admitting Physician Reubin Milan, MD PCP:Pcp, No  Brief Narrative: Patient is a 65 y.o. 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 2/22>> CT chest with contrast: Nodular/cavitary airspace consolidation in the left upper/lower lobes 2/22>> Echo: EF 50-55% 2/24>> CT head: No acute intracranial process 2/24>> nuclear stress test: No ischemia  Antimicrobial therapy: Amphotericin/flucytosine: 2/21>>3/9 Ethambutol: 3/8>> Rifabutin: 3/8>> Zithromax: 3/8>> Fluconazole: 3/9>>  Microbiology data: 2/21>> CSF culture: Cryptococcus neoformans 2/23>> sputum AFB smear negative 2/23>> sputum AFB DNA probe: Positive for M avium complex 2/24>> sputum AFB smear: Negative 2/24>> CSF culture: Cryptococcus neoformans 2/28>> CSF culture: No growth 3/8>> CSF Gram stain: Cryptococcus neoformans 3/8>> CSF culture: No growth  Procedures : LP>> 2/22, 2/24, 2/28, 3/8  Consults: Neurology, cardiology Terri Skains), ID, ophthalmology  DVT Prophylaxis : SCDs Start: 03/26/20 1946   Subjective: Lying comfortably in bed-no major issues overnight.  Assessment/Plan: Cryptococcal meningitis: Overall improved-completed a course of IV amphotericin/flucytosine-has been transitioned to fluconazole.  Await final culture results of LP from 3/8.  Discussed with ID-Dr. Johnny Bridge on 3/10-recommending continued inpatient until 3/14-to ensure that CSF cultures are negative before  consideration of discharge.    AKI: Probably secondary to-?  Bactrim-UA unremarkable-renal ultrasound without hydronephrosis-have discussed with ID-Dr. Thurnell Garbe will review chart and provide further recommendations  Hyperkalemia: Probably due to Bactrim-Lokelma x2 doses-repeat electrolytes tomorrow.  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  Histoplasma/blastomycosis antigen negative.   HIV/AIDS: Noncompliant-timing for initiation of ART defer to ID.  SVT: Continue telemetry monitoring-on Cardizem-now with transient second-degree heart block-await cardiology eval.  Nuclear stress test negative-echo with preserved EF.  TSH within normal limit  Recent COVID-19 infection: Asymptomatic-out of quarantine window.  Chronic right eye blurriness: Evaluated by ophthalmology on 3/7-no evidence of infection.  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 Yes; Fluid consistency: Thin  Diet effective now                  Code Status: Full code  Family Communication: Will reach out to family over the next few days.  Disposition Plan: Status is: Inpatient  Remains inpatient appropriate because:Inpatient level of care appropriate due to severity of illness   Dispo:  Patient From: Home  Planned Disposition: Home  Medically stable for discharge: No     Barriers to Discharge: Cryptococcal meningitis-awaiting CSF cultures to be final before consideration of discharge-Per discussion with ID on 3/10-potential discharge on 3/14.  Antimicrobial agents: Anti-infectives (From admission,  onward)   Start     Dose/Rate Route Frequency Ordered Stop   04/11/20 1815  sulfamethoxazole-trimethoprim (BACTRIM DS) 800-160 MG per tablet 1 tablet        1 tablet Oral Once per day on Mon Wed Fri 04/11/20 1722     04/11/20 1200  fluconazole (DIFLUCAN) tablet 800 mg        800 mg Oral Daily 04/11/20 1052     04/11/20 1000  azithromycin (ZITHROMAX) tablet 500 mg        500 mg Oral Daily 04/10/20 1038     04/10/20 1130  ethambutol (MYAMBUTOL) tablet 1,200 mg        1,200 mg Oral Daily 04/10/20 1038     04/10/20 1130  rifabutin (MYCOBUTIN) capsule 300 mg        300 mg Oral Daily 04/10/20 1038     03/28/20 1000  azithromycin (ZITHROMAX) tablet 1,200 mg  Status:  Discontinued        1,200 mg Oral Weekly 03/28/20 0912 04/10/20 1038   03/28/20 1000  sulfamethoxazole-trimethoprim (BACTRIM) 400-80 MG per tablet 1 tablet  Status:  Discontinued        1 tablet Oral Daily 03/28/20 0912 04/02/20 0919   03/27/20 2115  amphotericin B liposome (AMBISOME) 300 mg in dextrose 5 % 500 mL IVPB  Status:  Discontinued        300 mg 287.5 mL/hr over 120 Minutes Intravenous Every 24 hours 03/26/20 2024 03/26/20 2227   03/26/20 2227  amphotericin B liposome (AMBISOME) 300 mg in dextrose 5 % 500 mL IVPB  Status:  Discontinued        300 mg 287.5 mL/hr over 120 Minutes Intravenous Every 24 hours 03/26/20 2227 04/11/20 1052   03/26/20 1945  amphotericin B liposome (AMBISOME) 300 mg in dextrose 5 % 500 mL IVPB  Status:  Discontinued        300 mg 287.5 mL/hr over 120 Minutes Intravenous Every 24 hours 03/26/20 1850 03/26/20 2024   03/26/20 1900  flucytosine (ANCOBON) capsule 2,000 mg  Status:  Discontinued        25 mg/kg  80 kg Oral Every 6 hours 03/26/20 1850 04/11/20 1052       Time spent: 15- minutes-Greater than 50% of this time was spent in counseling, explanation of diagnosis, planning of further management, and coordination of care.  MEDICATIONS: Scheduled Meds: . azithromycin  500 mg Oral Daily  .  clobetasol cream   Topical BID  . dextromethorphan  30 mg Oral BID  . diltiazem  180 mg Oral Daily  . ethambutol  1,200 mg Oral Daily  . fluconazole  800 mg Oral Daily  . pravastatin  20 mg Oral QPM  . rifabutin  300 mg Oral Daily  . sodium zirconium cyclosilicate  10 g Oral BID  . sulfamethoxazole-trimethoprim  1 tablet Oral Once per day on Mon Wed Fri   Continuous Infusions:  PRN Meds:.acetaminophen **OR** acetaminophen, acetaminophen, diphenhydrAMINE **OR** diphenhydrAMINE, ondansetron **OR** ondansetron (ZOFRAN) IV, oxyCODONE   PHYSICAL EXAM: Vital signs: Vitals:   04/13/20 1930 04/14/20 0003 04/14/20 0425 04/14/20 0915  BP: (!) 134/93 133/84 (!) 125/92 125/77  Pulse: (!) 59 (!) 58 (!) 54 (!) 55  Resp: _0 Temp: 97.6 F (36.4 C) 97.9 F (36.6 C) 97.6 F (36.4 C) 97.6 F (36.4 C)  TempSrc:  Oral Oral Oral  SpO2: 100% 99% 98% 98%  Weight:      Height:       Filed Weights   03/26/20 1839 03/27/20 0141  Weight: 80 kg 80.8 kg   Body  mass index is 23.5 kg/m.   Gen Exam:Alert awake-not in any distress HEENT:atraumatic, normocephalic Chest: B/L clear to auscultation anteriorly CVS:S1S2 regular Abdomen:soft non tender, non distended Extremities:no edema Neurology: Non focal Skin: no rash  I have personally reviewed following labs and imaging studies  LABORATORY DATA: CBC: Recent Labs  Lab 04/09/20 0343 04/10/20 0352 04/11/20 0434 04/12/20 0409 04/13/20 0411  WBC 5.7 4.8 3.9* 3.3* 2.8*  HGB 11.9* 11.4* 11.7* 12.2* 12.0*  HCT 33.4* 32.6* 33.9* 33.9* 34.6*  MCV 94.6 95.6 96.3 94.4 96.6  PLT 324 307 290 278 716    Basic Metabolic Panel: Recent Labs  Lab 04/10/20 0352 04/11/20 0434 04/12/20 0409 04/13/20 0411 04/14/20 0236  NA 132* 131* 134* 134* 131*  K 4.1 4.5 4.6 5.1 5.4*  CL 103 104 104 105 102  CO2 21* 17* _0 GLUCOSE 163* 137* 102* 117* 107*  BUN _1 24*  CREATININE 1.04 0.98 1.41* 1.61* 1.46*  CALCIUM 9.1 9.0 9.4 9.3  9.4  MG 1.7 2.0 1.8 1.8 2.1    GFR: Estimated Creatinine Clearance: 59.3 mL/min (A) (by C-G formula based on SCr of 1.46 mg/dL (H)).  Liver Function Tests: No results for input(s): AST, ALT, ALKPHOS, BILITOT, PROT, ALBUMIN in the last 168 hours. No results for input(s): LIPASE, AMYLASE in the last 168 hours. No results for input(s): AMMONIA in the last 168 hours.  Coagulation Profile: No results for input(s): INR, PROTIME in the last 168 hours.  Cardiac Enzymes: No results for input(s): CKTOTAL, CKMB, CKMBINDEX, TROPONINI in the last 168 hours.  BNP (last 3 results) No results for input(s): PROBNP in the last 8760 hours.  Lipid Profile: No results for input(s): CHOL, HDL, LDLCALC, TRIG, CHOLHDL, LDLDIRECT in the last 72 hours.  Thyroid Function Tests: No results for input(s): TSH, T4TOTAL, FREET4, T3FREE, THYROIDAB in the last 72 hours.  Anemia Panel: No results for input(s): VITAMINB12, FOLATE, FERRITIN, TIBC, IRON, RETICCTPCT in the last 72 hours.  Urine analysis:    Component Value Date/Time   COLORURINE YELLOW 04/13/2020 1209   APPEARANCEUR CLEAR 04/13/2020 1209   LABSPEC 1.009 04/13/2020 1209   PHURINE 5.0 04/13/2020 1209   GLUCOSEU NEGATIVE 04/13/2020 1209   HGBUR NEGATIVE 04/13/2020 1209   [place] 04/13/2020 1209   KETONESUR NEGATIVE 04/13/2020 1209   PROTEINUR NEGATIVE 04/13/2020 1209   NITRITE NEGATIVE 04/13/2020 1209   LEUKOCYTESUR NEGATIVE 04/13/2020 1209    Sepsis Labs: Lactic Acid, Venous    Component Value Date/Time   LATICACIDVEN 0.9 03/26/2020 0958    MICROBIOLOGY: Recent Results (from the past 240 hour(s))  CSF culture w Stat Gram Stain     Status: None   Collection Time: 04/10/20  5:18 PM   Specimen: CSF; Cerebrospinal Fluid  Result Value Ref Range Status   Specimen Description CSF  Final   Special Requests NONE  Final   Gram Stain   Final    WBC PRESENT, PREDOMINANTLY MONONUCLEAR CORRECTED RESULTS NO ORGANISMS  SEEN PREVIOUSLY REPORTED AS: CRYPTOCOCCUS NEOFORMANS CYTOSPIN SMEAR CRITICAL RESULT CALLED TO, READ BACK BY AND VERIFIED WITH: RN Ethlyn Daniels 1050 [id number] FCP    Culture   Final    NO GROWTH 3 DAYS Performed at [facility], [place] [address] [place], [place] [address]    Report Status 04/14/2020 FINAL  Final  Culture, fungus without smear     Status: None (Preliminary result)   Collection Time: 04/10/20  5:18 PM   Specimen: CSF; Other  Result Value Ref Range Status   Specimen Description CSF  Final   Special Requests NONE  Final   Culture   Final    NO FUNGUS ISOLATED AFTER 3 DAYS Performed at [facility], [address], [place], [place] [address]    Report Status PENDING  Incomplete    RADIOLOGY STUDIES/RESULTS: US RENAL  Result Date: 04/13/2020 CLINICAL DATA:  Acute kidney injury. EXAM: RENAL / URINARY TRACT ULTRASOUND COMPLETE COMPARISON:  None. FINDINGS: Right Kidney: Renal measurements: 10.7 x 5.4 x 6.7 cm = volume: 203 mL. Borderline increased renal parenchymal echogenicity. Renal contours are lobulated. No hydronephrosis. No visualized calculi or focal renal lesion. Left Kidney: Renal measurements: 10.1 x 6.2 x 6.0 cm = volume: 197 mL. Borderline increased renal parenchymal echogenicity. Lobulated renal contours. No hydronephrosis. No visualized stone or focal lesion. Bladder: Appears normal for degree of bladder distention. Other: None. IMPRESSION: 1. No obstructive uropathy. 2. Lobulated renal contours with borderline increased parenchymal echogenicity suggesting mild chronic medical renal disease. Electronically Signed   By: Keith Rake M.D.   On: 04/13/2020 17:34     LOS: 19 days   Oren Binet, MD  [facility]    To contact the attending provider between 7A-7P or the covering provider during after hours 7P-7A, please log into the web site [email or web address] and access using universal [place] password for that web site. If you do not have the  password, please call the hospital operator.  04/14/2020, 11:28 AM

## 2020-04-15 DIAGNOSIS — E876 Hypokalemia: Secondary | ICD-10-CM | POA: Diagnosis not present

## 2020-04-15 DIAGNOSIS — B20 Human immunodeficiency virus [HIV] disease: Secondary | ICD-10-CM | POA: Diagnosis not present

## 2020-04-15 DIAGNOSIS — B451 Cerebral cryptococcosis: Secondary | ICD-10-CM | POA: Diagnosis not present

## 2020-04-15 LAB — BASIC METABOLIC PANEL
Anion gap: 5 (ref 5–15)
BUN: 21 mg/dL (ref 8–23)
CO2: 23 mmol/L (ref 22–32)
Calcium: 9.4 mg/dL (ref 8.9–10.3)
Chloride: 100 mmol/L (ref 98–111)
Creatinine, Ser: 1.39 mg/dL — ABNORMAL HIGH (ref 0.61–1.24)
GFR, Estimated: 57 mL/min — ABNORMAL LOW (ref >60)
Glucose, Bld: 102 mg/dL — ABNORMAL HIGH (ref 70–99)
Potassium: 5.4 mmol/L — ABNORMAL HIGH (ref 3.5–5.1)
Sodium: 128 mmol/L — ABNORMAL LOW (ref 135–145)

## 2020-04-15 LAB — SODIUM, URINE, RANDOM: Sodium, Ur: 59 mmol/L

## 2020-04-15 LAB — URINALYSIS, ROUTINE W REFLEX MICROSCOPIC
Bilirubin Urine: NEGATIVE
Glucose, UA: NEGATIVE mg/dL
Ketones, ur: NEGATIVE mg/dL
Nitrite: NEGATIVE
Protein, ur: NEGATIVE mg/dL
Specific Gravity, Urine: 1.006 (ref 1.005–1.030)
WBC, UA: >50 WBC/hpf — ABNORMAL HIGH (ref 0–5)
pH: 5 (ref 5.0–8.0)

## 2020-04-15 LAB — OSMOLALITY: Osmolality: 284 mOsm/kg (ref 275–295)

## 2020-04-15 LAB — OSMOLALITY, URINE: Osmolality, Ur: 279 mOsm/kg — ABNORMAL LOW (ref 300–900)

## 2020-04-15 LAB — URIC ACID: Uric Acid, Serum: 5.2 mg/dL (ref 3.7–8.6)

## 2020-04-15 LAB — MAGNESIUM: Magnesium: 1.8 mg/dL (ref 1.7–2.4)

## 2020-04-15 LAB — CREATININE, URINE, RANDOM: Creatinine, Urine: 48.35 mg/dL

## 2020-04-15 MED ORDER — FLUCONAZOLE 200 MG PO TABS
400.0000 mg | ORAL_TABLET | Freq: Every day | ORAL | Status: DC
  Administered 2020-04-16 – 2020-04-25 (×10): 400 mg via ORAL
  Filled 2020-04-15 (×10): qty 2

## 2020-04-15 MED ORDER — DAPSONE 100 MG PO TABS
100.0000 mg | ORAL_TABLET | Freq: Every day | ORAL | Status: DC
Start: 2020-04-15 — End: 2020-04-17
  Administered 2020-04-15 – 2020-04-16 (×2): 100 mg via ORAL
  Filled 2020-04-15 (×3): qty 1

## 2020-04-15 MED ORDER — SODIUM ZIRCONIUM CYCLOSILICATE 10 G PO PACK
10.0000 g | PACK | Freq: Three times a day (TID) | ORAL | Status: AC
  Administered 2020-04-15 (×3): 10 g via ORAL
  Filled 2020-04-15 (×3): qty 1

## 2020-04-15 NOTE — Progress Notes (Signed)
Patient's nurse texted nurse to visit with patient because he is transitioning to end of life care. Chaplain visited with patient who seemed to be in high spirits and asked if Chaplain could come back and visit again. Chaplain is available when needed. There was no family present.

## 2020-04-15 NOTE — Progress Notes (Addendum)
PROGRESS NOTE        PATIENT DETAILS Name: Ricky [name] Age: 65 y.o. Sex: male Date of Birth: Oct 14, 1955 Admit Date: 03/26/2020 Admitting Physician Reubin Milan, MD PCP:Pcp, No  Brief Narrative: Patient is a 65 y.o. 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 2/22>> CT chest with contrast: Nodular/cavitary airspace consolidation in the left upper/lower lobes 2/22>> Echo: EF 50-55% 2/24>> CT head: No acute intracranial process 2/24>> nuclear stress test: No ischemia  Antimicrobial therapy: Amphotericin/flucytosine: 2/21>>3/9 Ethambutol: 3/8>> Rifabutin: 3/8>> Zithromax: 3/8>> Fluconazole: 3/9>>  Microbiology data: 2/21>> CSF culture: Cryptococcus neoformans 2/23>> sputum AFB smear negative 2/23>> sputum AFB DNA probe: Positive for M avium complex 2/24>> sputum AFB smear: Negative 2/24>> CSF culture: Cryptococcus neoformans 2/28>> CSF culture: No growth 3/8>> CSF Gram stain: Cryptococcus neoformans 3/8>> CSF culture: No growth  Procedures : LP>> 2/22, 2/24, 2/28, 3/8  Consults: Neurology, cardiology Terri Skains), ID, ophthalmology  DVT Prophylaxis : SCDs Start: 03/26/20 1946   Subjective:  Patient in bed, appears comfortable, denies any headache, no fever, no chest pain or pressure, no shortness of breath , no abdominal pain. No focal weakness.   Assessment/Plan:  Cryptococcal meningitis: Overall improved-completed a course of IV amphotericin/flucytosine-has been transitioned to fluconazole.  Await final culture results of LP from 3/8.  Discussed with ID-Dr. Johnny Bridge on  3/10-recommending continued inpatient until 3/14-to ensure that CSF cultures are negative before consideration of discharge.  I have requested ID to please clarify HIV and cryptococcal treatment on 04/15/2020 prior to discharge  HIV/AIDS: Noncompliant-timing for initiation of ART defer to ID.  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  Histoplasma/blastomycosis antigen negative.   History of tuberculosis-apparently treated at the [facility] and April 2001  AKI with hyponatremia: Is on Bactrim and also at risk for SIADH, will check serum osmolality along with urine electrolytes and osmolality, uric acid and monitor.  ID has been informed about AKI and hyperkalemia by previous MD on 04/14/2020  Hyperkalemia: Probably due to Bactrim-we will repeat Lokelma 3 doses on 04/15/2020.  SVT: Continue telemetry monitoring-on Cardizem-now with transient second-degree heart block-await cardiology eval.  Nuclear stress test negative-echo with preserved EF.  TSH within normal limit, DW Cards Dr Einar Gip 04/15/20.  Recent COVID-19 infection: Asymptomatic-out of quarantine window.  Chronic right eye blurriness: Evaluated by ophthalmology on 3/7-no evidence of infection.    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  History of cocaine use & Tobacco abuse - counseled to quit.   Diet: Diet Order            Diet Heart Room service appropriate? Yes; Fluid consistency: Thin  Diet effective now                  Code Status: Full code  Family Communication: Will reach out to family over the next few days.  Disposition Plan: Status is: Inpatient  Remains inpatient appropriate because:Inpatient level of care appropriate  due to severity of  illness   Dispo:  Patient From: Home  Planned Disposition: Home  Medically stable for discharge: No     Barriers to Discharge: Cryptococcal meningitis-awaiting CSF cultures to be final before consideration of discharge-Per discussion with ID on 3/10-potential discharge on 3/14.  Antimicrobial agents: Anti-infectives (From admission, onward)   Start     Dose/Rate Route Frequency Ordered Stop   04/15/20 1015  dapsone tablet 100 mg        100 mg Oral Daily 04/15/20 0925     04/11/20 1815  sulfamethoxazole-trimethoprim (BACTRIM DS) 800-160 MG per tablet 1 tablet  Status:  Discontinued        1 tablet Oral Once per day on Mon Wed Fri 04/11/20 1722 04/15/20 0925   04/11/20 1200  fluconazole (DIFLUCAN) tablet 800 mg        800 mg Oral Daily 04/11/20 1052     04/11/20 1000  azithromycin (ZITHROMAX) tablet 500 mg        500 mg Oral Daily 04/10/20 1038     04/10/20 1130  ethambutol (MYAMBUTOL) tablet 1,200 mg        1,200 mg Oral Daily 04/10/20 1038     04/10/20 1130  rifabutin (MYCOBUTIN) capsule 300 mg        300 mg Oral Daily 04/10/20 1038     03/28/20 1000  azithromycin (ZITHROMAX) tablet 1,200 mg  Status:  Discontinued        1,200 mg Oral Weekly 03/28/20 0912 04/10/20 1038   03/28/20 1000  sulfamethoxazole-trimethoprim (BACTRIM) 400-80 MG per tablet 1 tablet  Status:  Discontinued        1 tablet Oral Daily 03/28/20 0912 04/02/20 0919   03/27/20 2115  amphotericin B liposome (AMBISOME) 300 mg in dextrose 5 % 500 mL IVPB  Status:  Discontinued        300 mg 287.5 mL/hr over 120 Minutes Intravenous Every 24 hours 03/26/20 2024 03/26/20 2227   03/26/20 2227  amphotericin B liposome (AMBISOME) 300 mg in dextrose 5 % 500 mL IVPB  Status:  Discontinued        300 mg 287.5 mL/hr over 120 Minutes Intravenous Every 24 hours 03/26/20 2227 04/11/20 1052   03/26/20 1945  amphotericin B liposome (AMBISOME) 300 mg in dextrose 5 % 500 mL IVPB  Status:  Discontinued        300 mg 287.5 mL/hr over  120 Minutes Intravenous Every 24 hours 03/26/20 1850 03/26/20 2024   03/26/20 1900  flucytosine (ANCOBON) capsule 2,000 mg  Status:  Discontinued        25 mg/kg  80 kg Oral Every 6 hours 03/26/20 1850 04/11/20 1052       Time spent: 15- minutes-Greater than 50% of this time was spent in counseling, explanation of diagnosis, planning of further management, and coordination of care.  MEDICATIONS: Scheduled Meds: . azithromycin  500 mg Oral Daily  . clobetasol cream   Topical BID  . dapsone  100 mg Oral Daily  . dextromethorphan  30 mg Oral BID  . diltiazem  180 mg Oral Daily  . ethambutol  1,200 mg Oral Daily  . fluconazole  800 mg Oral Daily  . pravastatin  20 mg Oral QPM  . rifabutin  300 mg Oral Daily  . sodium zirconium cyclosilicate  10 g Oral TID   Continuous Infusions:  PRN Meds:.acetaminophen, acetaminophen **OR** [DISCONTINUED] acetaminophen, diphenhydrAMINE **OR** [DISCONTINUED] diphenhydrAMINE, [DISCONTINUED] ondansetron **OR** ondansetron (ZOFRAN) IV, oxyCODONE   PHYSICAL EXAM:  Vital signs: Vitals:   04/14/20 1956 04/15/20 0000 04/15/20 0402 04/15/20 0900  BP: 129/83 138/89 119/77 126/79  Pulse: (!) 56 60 (!) 51 66  Resp: _0 Temp: 97.7 F (36.5 C) 98.1 F (36.7 C) 97.9 F (36.6 C) 98.2 F (36.8 C)  TempSrc: Oral Oral Oral Oral  SpO2: 100% 99% 98% 98%  Weight:      Height:       Filed Weights   03/26/20 1839 03/27/20 0141  Weight: 80 kg 80.8 kg   Body mass index is 23.5 kg/m.   Gen Exam:  Awake Alert, Oriented X 3, No new F.N deficits, Normal affect [place].AT,PERRAL Supple Neck,No JVD, No cervical lymphadenopathy appriciated.  Symmetrical Chest wall movement, Good air movement bilaterally, CTAB RRR,No Gallops, Rubs or new Murmurs, No Parasternal Heave +ve B.Sounds, Abd Soft, No tenderness, No organomegaly appriciated, No rebound - guarding or rigidity. No Cyanosis, Clubbing or edema, No new Rash or bruise  I have personally reviewed  following labs and imaging studies  LABORATORY DATA: CBC: Recent Labs  Lab 04/09/20 0343 04/10/20 0352 04/11/20 0434 04/12/20 0409 04/13/20 0411  WBC 5.7 4.8 3.9* 3.3* 2.8*  HGB 11.9* 11.4* 11.7* 12.2* 12.0*  HCT 33.4* 32.6* 33.9* 33.9* 34.6*  MCV 94.6 95.6 96.3 94.4 96.6  PLT 324 307 290 278 496    Basic Metabolic Panel: Recent Labs  Lab 04/11/20 0434 04/12/20 0409 04/13/20 0411 04/14/20 0236 04/15/20 0405  NA 131* 134* 134* 131* 128*  K 4.5 4.6 5.1 5.4* 5.4*  CL 104 104 105 102 100  CO2 17* _1 GLUCOSE 137* 102* 117* 107* 102*  BUN _2 24* 21  CREATININE 0.98 1.41* 1.61* 1.46* 1.39*  CALCIUM 9.0 9.4 9.3 9.4 9.4  MG 2.0 1.8 1.8 2.1 1.8    GFR: Estimated Creatinine Clearance: 62.3 mL/min (A) (by C-G formula based on SCr of 1.39 mg/dL (H)).  Liver Function Tests: No results for input(s): AST, ALT, ALKPHOS, BILITOT, PROT, ALBUMIN in the last 168 hours. No results for input(s): LIPASE, AMYLASE in the last 168 hours. No results for input(s): AMMONIA in the last 168 hours.  Coagulation Profile: No results for input(s): INR, PROTIME in the last 168 hours.  Cardiac Enzymes: No results for input(s): CKTOTAL, CKMB, CKMBINDEX, TROPONINI in the last 168 hours.  BNP (last 3 results) No results for input(s): PROBNP in the last 8760 hours.  Lipid Profile: No results for input(s): CHOL, HDL, LDLCALC, TRIG, CHOLHDL, LDLDIRECT in the last 72 hours.  Thyroid Function Tests: No results for input(s): TSH, T4TOTAL, FREET4, T3FREE, THYROIDAB in the last 72 hours.  Anemia Panel: No results for input(s): VITAMINB12, FOLATE, FERRITIN, TIBC, IRON, RETICCTPCT in the last 72 hours.  Urine analysis:    Component Value Date/Time   COLORURINE YELLOW 04/13/2020 1209   APPEARANCEUR CLEAR 04/13/2020 1209   LABSPEC 1.009 04/13/2020 1209   PHURINE 5.0 04/13/2020 1209   GLUCOSEU NEGATIVE 04/13/2020 1209   HGBUR NEGATIVE 04/13/2020 1209   BILIRUBINUR NEGATIVE  04/13/2020 1209   KETONESUR NEGATIVE 04/13/2020 1209   PROTEINUR NEGATIVE 04/13/2020 1209   NITRITE NEGATIVE 04/13/2020 1209   LEUKOCYTESUR NEGATIVE 04/13/2020 1209    Sepsis Labs: Lactic Acid, Venous    Component Value Date/Time   LATICACIDVEN 0.9 03/26/2020 0958    MICROBIOLOGY: Recent Results (from the past 240 hour(s))  CSF culture w Stat Gram Stain     Status: None   Collection Time: 04/10/20  5:18  PM   Specimen: CSF; Cerebrospinal Fluid  Result Value Ref Range Status   Specimen Description CSF  Final   Special Requests NONE  Final   Gram Stain   Final    WBC PRESENT, PREDOMINANTLY MONONUCLEAR CORRECTED RESULTS NO ORGANISMS SEEN PREVIOUSLY REPORTED AS: CRYPTOCOCCUS NEOFORMANS CYTOSPIN SMEAR CRITICAL RESULT CALLED TO, READ BACK BY AND VERIFIED WITH: RN Ethlyn Daniels 1050 [id number] FCP    Culture   Final    NO GROWTH 3 DAYS Performed at [facility], [place] [address] [place], [place] [address]    Report Status 04/14/2020 FINAL  Final  Culture, fungus without smear     Status: None (Preliminary result)   Collection Time: 04/10/20  5:18 PM   Specimen: CSF; Other  Result Value Ref Range Status   Specimen Description CSF  Final   Special Requests NONE  Final   Culture   Final    No Fungi Isolated in 4 Weeks Performed at [facility], [address] [place], [place] [address]    Report Status PENDING  Incomplete    RADIOLOGY STUDIES/RESULTS: US RENAL  Result Date: 04/13/2020 CLINICAL DATA:  Acute kidney injury. EXAM: RENAL / URINARY TRACT ULTRASOUND COMPLETE COMPARISON:  None. FINDINGS: Right Kidney: Renal measurements: 10.7 x 5.4 x 6.7 cm = volume: 203 mL. Borderline increased renal parenchymal echogenicity. Renal contours are lobulated. No hydronephrosis. No visualized calculi or focal renal lesion. Left Kidney: Renal measurements: 10.1 x 6.2 x 6.0 cm = volume: 197 mL. Borderline increased renal parenchymal echogenicity. Lobulated renal contours. No  hydronephrosis. No visualized stone or focal lesion. Bladder: Appears normal for degree of bladder distention. Other: None. IMPRESSION: 1. No obstructive uropathy. 2. Lobulated renal contours with borderline increased parenchymal echogenicity suggesting mild chronic medical renal disease. Electronically Signed   By: Keith Rake M.D.   On: 04/13/2020 17:34     LOS: 20 days   Lala Lund, MD  [facility]   04/15/2020, 11:46 AM

## 2020-04-15 NOTE — Progress Notes (Signed)
Due to hyperkalemia associated with Bactrim, Dr. [place] Bali has ordered dapsone for PJP prophylaxis. G6PD has been ordered.   Dapsone 100mg  PO qday F/u with G6PD result  Onnie Boer, PharmD, BCIDP, AAHIVP, CPP Infectious Disease Pharmacist 04/15/2020 9:27 AM

## 2020-04-15 NOTE — Plan of Care (Signed)
  Problem: Clinical Measurements: Goal: Diagnostic test results will improve Outcome: Progressing   Problem: Clinical Measurements: Goal: Will remain free from infection Outcome: Progressing   Problem: Clinical Measurements: Goal: Ability to maintain clinical measurements within normal limits will improve Outcome: Progressing   Problem: Health Behavior/Discharge Planning: Goal: Ability to manage health-related needs will improve Outcome: Progressing   Problem: Education: Goal: Knowledge of General Education information will improve Description: Including pain rating scale, medication(s)/side effects and non-pharmacologic comfort measures 04/15/2020 2341 by Carin Hock I, RN Outcome: Progressing 04/15/2020 2341 by Marcos Eke, RN Outcome: Progressing

## 2020-04-15 NOTE — Progress Notes (Signed)
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 Problems:   Leukocytopenia   Macrocytosis   Moderate protein malnutrition (HCC)   Hypoalbuminemia   AIDS (acquired immune deficiency syndrome) (HCC)   Atrial flutter (HCC)   Aortic atherosclerosis (HCC)   Hypertension   SVT (supraventricular tachycardia) (HCC)   Abnormal EKG   Acute nonintractable headache   Smoking   Cocaine use   Cavitary lesion of lung   AKI (acute kidney injury) ([place])   Blurry vision, left eye   Mycobacterium avium infection ([place])   Antibiotics:  3/10-c fluconazole 800 mg po daily 3/08-c ethambutol 1200 mg po daily 3/08-c rifabutin 300 mg po daily 2/21-c azith (changed from prophy dose weekly to treatment dose daily on 3/08) TMP-SMX ds PCP prophylaxis; hold 2/28 while on amphotericin; restarted 3/09 - stopped 3/13 due to hyperkalemia and increaisng Cr    2/21-3/10 Amphotericin B 2/21-3/10 Flucytosine 2/21   Lines/Tubes: PIV   Interval Events: afebrile, Cr gradually downtrending with IVF but hyperkalemia noted.    Assessment Cryptococcal meningitis  MAC/nodular cavitary lesions in Chest CT - on tx as above  AIDS H/o TB, treated   Recommendations -CSF cx 3/9 with no organisms on Gram stain and cx NG in 3 days ( finalized). Patient has already completed more than 2 weeks of induction therapy. Can transition to consolidation therapy with Fluconazole 447m PO daily  -Continue MAC therapy as is per Dr VGale Journey-Given worsening hyperkalemia and fluctuating Serum Cr, Will change Bactrim ppx to dapsone. F/u g6pd levels ( ordered) -Refer to Dr VHart Rochesternote on 3/11 for ID follow up and plan to start ART   New ID team  will follow starting tomorrow.   Rest of the management as per the primary team. Thank you for the consult. Please page with pertinent questions or concerns.  ______________________________________________________________________ Subjective patient seen and examined at the bedside. Lying in bed. Headache is mainly aorund his eyes and sinuses - thinks related to sinus issue but definitely thinks his headache is way better than when he came in No worsening eye symptoms ( Rt eye blurriness) Denies N/V/D/rashes   Vitals BP 126/79 (BP Location: Right Arm)   Pulse 66   Temp 98.2 F (36.8 C) (Oral)   Resp 18   Ht _0  (1.854 m)   Wt 80.8 kg   SpO2 98%   BMI 23.50 kg/m     Physical Exam lyin in bed with no acute distress PERRLA, no pallor and icterus, no thrush Chest - breathing without difficulty CVS- Normal heart sounds Extremities - no edema,  Neuro - awake, alert, oriented and non focal Skin - no rashes   Pertinent Microbiology Results for orders placed or performed during the hospital encounter of 03/26/20  CSF culture     Status: Abnormal   Collection Time: 03/26/20  5:05 PM   Specimen: CSF; Cerebrospinal Fluid  Result Value Ref Range Status   Specimen Description CSF  Final   Special Requests NONE  Final   Gram Stain   Final    WBC PRESENT,BOTH PMN AND MONONUCLEAR CRYPTOCOCCUS NEOFORMANS CYTOSPIN SMEAR [facility]formed [facility], [address], [place] [address]   Culture CRYPTOCOCCUS NEOFORMANS (A)  Final   Report Status 03/29/2020 FINAL  Final  Fungus Culture With Stain     Status: Abnormal   Collection Time: 03/26/20  5:05 PM   Specimen: Lumbar Puncture; Cerebrospinal Fluid  Result Value Ref Range Status   Fungus Stain Final report  Final   Fungus (Mycology) Culture Preliminary report (A)  Final    Comment: (NOTE) Called prelim result of yeast isolated to [place] on 04-06-20 at 0950. Faxed to [phone]. LL Performed At: [facility] [place], [place] [id number] [name] MD YO:[id number]    Fungal Source CSF  Final    Comment: Performed at [facility], [place] [address] [place], [place] [address]  Fungus Culture Result     Status: None   Collection Time: 03/26/20  5:05 PM  Result Value Ref Range Status   Result 1 Comment  Final    Comment: (NOTE) KOH/Calcofluor preparation:  no fungus observed. Performed At: [facility] [place], [place] [id number] [name] MD OB:[id number]   Fungal organism reflex     Status: Abnormal   Collection Time: 03/26/20  5:05 PM  Result Value Ref Range Status   Fungal result 1 Comment (A)  Final    Comment: (NOTE) Cryptococcus neoformans Light growth Performed At: [facility] [place], [place] [id number] [name] MD TW:[id number]   Expectorated Sputum Assessment w Gram Stain, Rflx to Resp Cult     Status: None   Collection Time: 03/27/20  8:23 PM   Specimen: Sputum  Result Value Ref Range Status   Specimen Description SPUTUM  Final   Special Requests NONE  Final   Sputum evaluation   Final    Sputum specimen not acceptable for testing.  Please recollect.   RESULT CALLED TO, READ BACK BY AND VERIFIED WITH: Radene Knee RN 03/27/20 2225 JDW Performed at [facility], [place] [address] [place], [place] [address]    Report Status 03/27/2020 FINAL  Final  Expectorated Sputum Assessment w Gram Stain, Rflx to Resp Cult     Status: None   Collection Time: 03/28/20  1:45 AM   Specimen: Sputum  Result Value Ref Range Status   Specimen Description SPUTUM  Final   Special Requests NONE  Final   Sputum evaluation   Final    Sputum specimen not acceptable for testing.  Please recollect.   RESULT CALLED TO, READ BACK BY AND VERIFIED WITH: P ASIDI RN 03/27/20 0354 JDW Performed at [facility], [place] [address], [place], [place] [address]    Report Status 03/28/2020 FINAL  Final  Acid Fast  Smear (AFB)     Status: None   Collection Time: 03/28/20 12:00 PM   Specimen: Sputum  Result Value Ref Range Status   AFB Specimen Processing Concentration  Final   Acid Fast Smear Negative  Final    Comment: (NOTE) Performed At: [facility] [address] [place], [place] [id number] [name] MD KZ:[id number]    Source (AFB) SPU  Final    Comment: Performed at [facility], [place] [address], [place], [place] [address]  Acid Fast Culture with reflexed sensitivities     Status: Abnormal   Collection Time: 03/28/20 12:00 PM   Specimen: Sputum  Result Value Ref Range Status   Acid Fast Culture Positive (A)  Final    Comment: (NOTE) Acid-fast bacilli have been detected in culture at 2 weeks; see AFB Organism ID by DNA probe Performed At: [place] [address]  [place], [place] [id number] [name] MD AS:[id number]    Source of Sample SPU  Final    Comment: Performed at [facility], [place] [address], [place], [place] [address]  Fungus Culture With Stain     Status: Abnormal (Preliminary result)   Collection Time: 03/28/20 12:00 PM   Specimen: Sputum  Result Value Ref Range Status   Fungus Stain Final report (A)  Final    Comment: (NOTE) Performed At: [facility] [address] [place], [place] [id number] [name] MD MB:[id number]    Fungus (Mycology) Culture PENDING  Incomplete   Fungal Source SPU  Final    Comment: Performed at [facility], [place] [address], [place], [place] [address]  Fungus Culture Result     Status: Abnormal   Collection Time: 03/28/20 12:00 PM  Result Value Ref Range Status   Result 1 Comment (A)  Final    Comment: (NOTE) Fungal elements, such as arthroconidia, hyphal fragments, chlamydoconidia, observed. Performed At: [facility] [place], [place] [id number] [name] MD OV:[id number]   AFB Organism ID By DNA Probe     Status: Abnormal   Collection Time:  03/28/20 12:00 PM  Result Value Ref Range Status   M tuberculosis complex Negative  Final   M avium complex 8 (A)  Final    Comment: Positive NOTIFIED ON 03 22 @ 44 DS    M kansasii Not Indicated  Final   M gordonae Not Indicated  Final    Comment: (NOTE) Performed At: [facility] [place], [place] [id number] [name] MD ET:[id number]    OtherElnora Morrison  Final    Comment: No additional identification testing is necessary.   Susceptibility Testing REFERT  Corrected    Comment: (NOTE) Please refer to the following specimen for additional lab results. Susceptibility testing performed under accession #[id number] CORRECTED ON 03/09 AT 0837: PREVIOUSLY REPORTED AS See reflex.   Acid Fast Smear (AFB)     Status: None   Collection Time: 03/29/20  1:56 PM   Specimen: Sputum  Result Value Ref Range Status   AFB Specimen Processing Concentration  Final   Acid Fast Smear Negative  Final    Comment: (NOTE) Performed At: [facility] [place], [place] [id number] [name] MD FP:[id number]    Source (AFB) SPUTUM  Final    Comment: Performed at [facility], [place] [address], [place], [place] [address]  Acid Fast Culture with reflexed sensitivities     Status: Abnormal   Collection Time: 03/29/20  1:56 PM   Specimen: Sputum  Result Value Ref Range Status   Acid Fast Culture Positive (A)  Final    Comment: (NOTE) Acid-fast bacilli have been detected in culture at 2 weeks; see AFB Organism ID by DNA probe REPORTED TO TOMADER S. ON 04-10-2020 AT 2:10 PM BY AG Performed At: [facility] [place], [place] [id number] [name] MD VG:[id number]    Source of Sample SPUTUM  Final    Comment: Performed at [facility], [place] [address], [place], [place] [address]  Fungus Culture With Stain     Status: None (Preliminary result)   Collection Time: 03/29/20  1:56 PM  Result Value Ref Range Status    Fungus Stain Final report  Final    Comment: (NOTE) Performed At: [facility] [place], [place] [id number] [name] MD OE:[id number]    Fungus (Mycology)  Culture PENDING  Incomplete   Fungal Source SPUTUM  Final    Comment: Performed at [facility], [place] [address] [place], [place] [address]  Fungus Culture Result     Status: None   Collection Time: 03/29/20  1:56 PM  Result Value Ref Range Status   Result 1 Comment  Final    Comment: (NOTE) KOH/Calcofluor preparation:  no fungus observed. Performed At: [facility] [place], [place] [id number] [name] MD VQ:[id number]   AFB Organism ID By DNA Probe     Status: Abnormal   Collection Time: 03/29/20  1:56 PM  Result Value Ref Range Status   M tuberculosis complex Negative  Final   M avium complex Positive (A)  Final   M kansasii Not Indicated  Final   M gordonae Not Indicated  Final   Other: NAIDN  Final    Comment: No additional identification testing is necessary.   Susceptibility Testing See reflex.  Final    Comment: (NOTE) Performed At: [facility] [place], [place] [id number] [name] MD YK:[id number]   Fungus Culture With Stain     Status: None (Preliminary result)   Collection Time: 03/29/20  4:24 PM  Result Value Ref Range Status   Fungus Stain Final report  Final    Comment: (NOTE) Performed At: [facility] [address] [place], [place] [id number] [name] MD WI:[id number]    Fungus (Mycology) Culture PENDING  Incomplete   Fungal Source CSF  Final    Comment: Performed at [facility], [place] [address] [place], [place] [address]  CSF culture w Gram Stain     Status: Abnormal   Collection Time: 03/29/20  4:24 PM   Specimen: CSF; Cerebrospinal Fluid  Result Value Ref Range Status   Specimen Description CSF  Final   Special Requests SAMPLE IN [place]IDGE PRIOR TO ADDING ON CSF CULTURE  Final   Gram  Stain   Final    WBC PRESENT,BOTH PMN AND MONONUCLEAR CYTOSPIN SMEAR CORRECTED RESULTS NO ORGANISMS SEEN PREVIOUSLY REPORTED AS: CRYPTOCOCCUS NEOFORMANS CORRECTED RESULTS CALLED TO: RN SONYA B 52 [id number] FCP    Culture (A)  Final    CRYPTOCOCCUS NEOFORMANS CRITICAL VALUE NOTED.  VALUE IS CONSISTENT WITH PREVIOUSLY REPORTED AND CALLED VALUE. Performed at [facility], [place] [address], [place], [place] [address]    Report Status 04/04/2020 FINAL  Final  Fungus Culture Result     Status: None   Collection Time: 03/29/20  4:24 PM  Result Value Ref Range Status   Result 1 Comment  Final    Comment: (NOTE) KOH/Calcofluor preparation:  no fungus observed. Performed At: [facility] [place], [place] [id number] [name] MD JH:[id number]   Expectorated Sputum Assessment w Gram Stain, Rflx to Resp Cult     Status: None   Collection Time: 03/30/20 10:50 AM   Specimen: Expectorated Sputum  Result Value Ref Range Status   Specimen Description EXPECTORATED SPUTUM  Final   Special Requests NONE  Final   Sputum evaluation   Final    Sputum specimen not acceptable for testing.  Please recollect.   RESULT CALLED TO, READ BACK BY AND VERIFIED WITH: ORODOSA RN 03/31/20 0539 JDW Performed at [place] [address] [place], [place] [address]    Report Status 03/31/2020 FINAL  Final  Expectorated Sputum Assessment w Gram Stain, Rflx to Resp Cult     Status: None  Collection Time: 03/31/20  9:12 AM   Specimen: Expectorated Sputum  Result Value Ref Range Status   Specimen Description EXPECTORATED SPUTUM  Final   Special Requests Immunocompromised  Final   Sputum evaluation   Final    Sputum specimen not acceptable for testing.  Please recollect.   Gram Stain Report Called to,Read Back By and Verified With: RN A LEWIS AT 1717 03/31/2020 BY L BENFIELD Performed at [facility], [place] [address] [place], [place] [address]    Report Status  03/31/2020 FINAL  Final  CSF culture w Gram Stain     Status: None   Collection Time: 04/02/20  3:00 PM   Specimen: CSF  Result Value Ref Range Status   Specimen Description CSF  Final   Special Requests NONE  Final   Gram Stain   Final    WBC PRESENT, PREDOMINANTLY MONONUCLEAR NO ORGANISMS SEEN CYTOSPIN SMEAR    Culture   Final    NO GROWTH 3 DAYS Performed at [facility], [place] [address] [place], [place] [address]    Report Status 04/05/2020 FINAL  Final  Blastomyces Antigen     Status: None   Collection Time: 04/03/20  4:42 AM   Specimen: Blood  Result Value Ref Range Status   Blastomyces Antigen None Detected None Detected ng/mL Final    Comment: (NOTE) Results reported as ng/mL in 0.2 - 14.7 ng/mL range Results above the limit of detection but below 0.2 ng/mL are reported as 'Positive, Below the Limit of Quantification' Results above 14.7 ng/mL are reported as 'Positive, Above the Limit of Quantification'    Specimen Type SERUM  Final    Comment: (NOTE) Performed At: [place], [place] [id number] Bruce Donath MD ZD:[id number]   CSF culture w Stat Gram Stain     Status: None   Collection Time: 04/10/20  5:18 PM   Specimen: CSF; Cerebrospinal Fluid  Result Value Ref Range Status   Specimen Description CSF  Final   Special Requests NONE  Final   Gram Stain   Final    WBC PRESENT, PREDOMINANTLY MONONUCLEAR CORRECTED RESULTS NO ORGANISMS SEEN PREVIOUSLY REPORTED AS: CRYPTOCOCCUS NEOFORMANS CYTOSPIN SMEAR CRITICAL RESULT CALLED TO, READ BACK BY AND VERIFIED WITH: RN Ethlyn Daniels 1050 [id number] FCP    Culture   Final    NO GROWTH 3 DAYS Performed at [facility], [place] [address], [place], [place] [address]    Report Status 04/14/2020 FINAL  Final  Culture, fungus without smear     Status: None (Preliminary result)   Collection Time: 04/10/20  5:18 PM   Specimen: CSF; Other  Result Value Ref Range Status   Specimen  Description CSF  Final   Special Requests NONE  Final   Culture   Final    No Fungi Isolated in 4 Weeks Performed at [facility], [address] [place], [place] [address]    Report Status PENDING  Incomplete    Pertinent Lab. CBC Latest Ref Rng & Units 04/13/2020 04/12/2020 04/11/2020  WBC 4.0 - 10.5 K/uL 2.8(L) 3.3(L) 3.9(L)  Hemoglobin 13.0 - 17.0 g/dL 12.0(L) 12.2(L) 11.7(L)  Hematocrit 39.0 - 52.0 % 34.6(L) 33.9(L) 33.9(L)  Platelets 150 - 400 K/uL 282 278 290   CMP Latest Ref Rng & Units 04/15/2020 04/14/2020 04/13/2020  Glucose 70 - 99 mg/dL 102(H) 107(H) 117(H)  BUN 8 - 23 mg/dL 21 24(H) 23  Creatinine 0.61 -  1.24 mg/dL 1.39(H) 1.46(H) 1.61(H)  Sodium 135 - 145 mmol/L 128(L) 131(L) 134(L)  Potassium 3.5 - 5.1 mmol/L 5.4(H) 5.4(H) 5.1  Chloride 98 - 111 mmol/L 100 102 105  CO2 22 - 32 mmol/L _0 Calcium 8.9 - 10.3 mg/dL 9.4 9.4 9.3  Total Protein 6.5 - 8.1 g/dL - - -  Total Bilirubin 0.3 - 1.2 mg/dL - - -  Alkaline Phos 38 - 126 U/L - - -  AST 15 - 41 U/L - - -  ALT 0 - 44 U/L - - -     Pertinent Imaging today Plain films and CT images have been personally visualized and interpreted; radiology reports have been reviewed. Decision making incorporated into the Impression / Recommendations.  I have spent approx 30 minutes for this patient encounter including review of prior medical records, coordination of care  with greater than 50% of time being face to face/counseling and discussing diagnostics/treatment plan with the patient/family.  Electronically signed by:   Rosiland Oz, MD Infectious Disease Physician [facility] for Infectious Disease Pager: [phone]

## 2020-04-16 ENCOUNTER — Other Ambulatory Visit (HOSPITAL_COMMUNITY): Payer: Medicaid Other

## 2020-04-16 DIAGNOSIS — B20 Human immunodeficiency virus [HIV] disease: Secondary | ICD-10-CM | POA: Diagnosis not present

## 2020-04-16 DIAGNOSIS — B451 Cerebral cryptococcosis: Secondary | ICD-10-CM | POA: Diagnosis not present

## 2020-04-16 DIAGNOSIS — A310 Pulmonary mycobacterial infection: Secondary | ICD-10-CM | POA: Diagnosis not present

## 2020-04-16 LAB — BASIC METABOLIC PANEL
Anion gap: 7 (ref 5–15)
BUN: 26 mg/dL — ABNORMAL HIGH (ref 8–23)
CO2: 22 mmol/L (ref 22–32)
Calcium: 9.3 mg/dL (ref 8.9–10.3)
Chloride: 99 mmol/L (ref 98–111)
Creatinine, Ser: 1.63 mg/dL — ABNORMAL HIGH (ref 0.61–1.24)
GFR, Estimated: 47 mL/min — ABNORMAL LOW (ref >60)
Glucose, Bld: 126 mg/dL — ABNORMAL HIGH (ref 70–99)
Potassium: 4.7 mmol/L (ref 3.5–5.1)
Sodium: 128 mmol/L — ABNORMAL LOW (ref 135–145)

## 2020-04-16 LAB — MAGNESIUM: Magnesium: 1.8 mg/dL (ref 1.7–2.4)

## 2020-04-16 LAB — GLUCOSE 6 PHOSPHATE DEHYDROGENASE
G6PDH: 1 U/g{Hb} — ABNORMAL LOW (ref 4.8–15.7)
Hemoglobin: 12.9 g/dL — ABNORMAL LOW (ref 13.0–17.7)

## 2020-04-16 MED ORDER — FUROSEMIDE 40 MG PO TABS: 40.0000 mg | ORAL_TABLET | Freq: Once | ORAL | Status: DC

## 2020-04-16 MED ORDER — LACTATED RINGERS IV SOLN: INTRAVENOUS | Status: AC

## 2020-04-16 NOTE — Progress Notes (Signed)
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 Problems:   Leukocytopenia   Macrocytosis   Moderate protein malnutrition (HCC)   Hypoalbuminemia   AIDS (acquired immune deficiency syndrome) (HCC)   Atrial flutter (HCC)   Aortic atherosclerosis (HCC)   Hypertension   SVT (supraventricular tachycardia) (HCC)   Abnormal EKG   Acute nonintractable headache   Smoking   Cocaine use   Cavitary lesion of lung   AKI (acute kidney injury) ([place])   Blurry vision, left eye   Mycobacterium avium infection ([place])   ASSESSMENT & PLAN:    #Cryptooccal meningitis S/P induction phase with amphotericin/flucytosine 03/26/20-04/11/20; fluconazole 860m daily 04/12/20-04/15/20.  Most recent LP done 04/10/20 no growth and opening pressure of 18cm H2O.  Transitioned 04/15/20 to consolidation phase of treatment with fluconazole 4010mdaily. . Continue consolidation phase treatment fluconazole 40060maily.  He will be on this for at least 8 weeks (through 06/10/20) before beginning maintenance therapy   #Pulmonary MAC Started on 3-drug regimen with azithromycin, ethambutol, and rifabutin by Dr Vu Gale Journey 04/10/20 given patients immunosuppression and burden of disease. . Continue current MAC therapy . Azithromycin 500m19mily . Rifabutin 300mg22mly . Ethambutol 1200mg 39my . He will continue this regimen until outpatient follow up and repeat sputum cultures (to be done outpatient)   #HIV/AIDS [place] 2-4 years prior to this admission with CD4 count <35 (2%) and viral load 35,500 copies.  ART being held in setting of cryptococcal meningitis.  Currently on Dapsone for PCP prophylaxis in setting of hyperkalemia, AKI on Bactrim. . Continue to hold ART  . Continue Dapsone for PCP prophylaxis.   Follow up G6PD level . Check HLA-[id number] in anticipation of possibly starting Triumeq at a later date . [facility] to [facility] . Will continue to follow   SUBJECTIVE:   24 hour events:  No acute events  Patient reports stable sinus congestion, left eye discomfort that is unchanged.  Reports no cough, chest pain, SOB.   OBJECTIVE:   Blood pressure (!) 119/56, pulse 72, temperature 98.2 F (36.8 C), temperature source Oral, resp. rate 18, height _0  (1.854 m), weight 80.8 kg, SpO2 98 %. Body mass index is 23.5 kg/m.  Physical Exam Constitutional:      General: He is not in acute distress.    Appearance: He is well-developed.  HENT:     Head: Normocephalic and atraumatic.  Neurological:     General: No focal deficit present.     Mental Status: He is alert and oriented to person, place, and time.  Psychiatric:        Mood and Affect: Mood normal.        Behavior: Behavior normal.      Lab Results: Lab Results  Component Value Date   WBC 2.8 (L) 04/13/2020   HGB 12.0 (L) 04/13/2020   HCT 34.6 (L) 04/13/2020   MCV 96.6 04/13/2020   PLT 282 04/13/2020    Lab Results  Component Value Date   NA 128 (L) 04/16/2020   K 4.7 04/16/2020   CO2 22 04/16/2020   GLUCOSE 126 (H) 04/16/2020   BUN 26 (H) 04/16/2020   CREATININE 1.63 (H) 04/16/2020   CALCIUM  9.3 04/16/2020   GFRNONAA 47 (L) 04/16/2020    Lab Results  Component Value Date   ALT 17 03/31/2020   AST 24 03/31/2020   ALKPHOS 100 03/31/2020   BILITOT 0.8 03/31/2020    No results found for: CRP     Component Value Date/Time   ESRSEDRATE 30 (H) 03/26/2020 4888     I have reviewed the micro and lab results in Epic.  Imaging: No results found.   Imaging  independently reviewed in Epic.    Raynelle Highland for Infectious Disease [place] [phone] pager 04/16/2020, 1:01 PM  I spent greater than 35 minutes with the patient including  greater than 50% of time in face to face counsel of the patient and in coordination of their care.

## 2020-04-16 NOTE — Progress Notes (Signed)
Subjective:  I was asked by Dr. Candiss Norse to evaluate heart block.  Patient had a 2.4-second pause.  Presently doing well, no dyspnea, complaints of cough.  States that he slept well.  No dizziness or syncope or palpitations.  He has been trying to walk in the room and except for generalized weakness in his legs, no specific complaints today.  Intake/Output from previous day:  I/O last 3 completed shifts: In: 480 [P.O.:480] Out: 1 [Urine:1] No intake/output data recorded.  Blood pressure 108/77, pulse 68, temperature 97.7 F (36.5 C), temperature source Oral, resp. rate 18, height 6' 1"  (1.854 m), weight 80.8 kg, SpO2 98 %. Physical Exam Constitutional:      Appearance: He is normal weight.  HENT:     Head: Atraumatic.  Cardiovascular:     Rate and Rhythm: Normal rate and regular rhythm.     Pulses: Intact distal pulses.     Heart sounds: Normal heart sounds. No murmur heard. No gallop.      Comments: No leg edema, no JVD. Pulmonary:     Effort: Pulmonary effort is normal.     Breath sounds: No wheezing (left base) or rales.  Abdominal:     General: Bowel sounds are normal.     Palpations: Abdomen is soft.  Musculoskeletal:        General: Normal range of motion.     Cervical back: Neck supple.  Neurological:     Mental Status: He is alert.     Lab Results: BNP (last 3 results) No results for input(s): BNP in the last 8760 hours.  ProBNP (last 3 results) No results for input(s): PROBNP in the last 8760 hours. BMP Latest Ref Rng & Units 04/16/2020 04/15/2020 04/14/2020  Glucose 70 - 99 mg/dL 126(H) 102(H) 107(H)  BUN 8 - 23 mg/dL 26(H) 21 24(H)  Creatinine 0.61 - 1.24 mg/dL 1.63(H) 1.39(H) 1.46(H)  Sodium 135 - 145 mmol/L 128(L) 128(L) 131(L)  Potassium 3.5 - 5.1 mmol/L 4.7 5.4(H) 5.4(H)  Chloride 98 - 111 mmol/L 99 100 102  CO2 22 - 32 mmol/L 22 23 24   Calcium 8.9 - 10.3 mg/dL 9.3 9.4 9.4   Hepatic Function Latest Ref Rng & Units 03/31/2020 03/30/2020 03/29/2020  Total  Protein 6.5 - 8.1 g/dL 7.2 7.3 7.7  Albumin 3.5 - 5.0 g/dL 2.8(L) 2.8(L) 3.0(L)  AST 15 - 41 U/L 24 23 27   ALT 0 - 44 U/L 17 15 17   Alk Phosphatase 38 - 126 U/L 100 82 93  Total Bilirubin 0.3 - 1.2 mg/dL 0.8 1.2 1.0   CBC Latest Ref Rng & Units 04/13/2020 04/12/2020 04/11/2020  WBC 4.0 - 10.5 K/uL 2.8(L) 3.3(L) 3.9(L)  Hemoglobin 13.0 - 17.0 g/dL 12.0(L) 12.2(L) 11.7(L)  Hematocrit 39.0 - 52.0 % 34.6(L) 33.9(L) 33.9(L)  Platelets 150 - 400 K/uL 282 278 290   Lipid Panel     Component Value Date/Time   CHOL 141 03/29/2020 0255   TRIG 118 03/29/2020 0255   HDL 31 (L) 03/29/2020 0255   CHOLHDL 4.5 03/29/2020 0255   VLDL 24 03/29/2020 0255   LDLCALC 86 03/29/2020 0255   Cardiac Panel (last 3 results) No results for input(s): CKTOTAL, CKMB, TROPONINI, RELINDX in the last 72 hours.  HEMOGLOBIN A1C No results found for: HGBA1C, MPG TSH Recent Labs    03/27/20 0949  TSH 0.628   Cardiac Studies:  Echocardiogram 03/28/2020: 1. Left ventricular ejection fraction, by estimation, is 50 to 55%. The  left ventricle has low normal function.  The left ventricle has no regional  wall motion abnormalities. There is mild concentric left ventricular  hypertrophy. Left ventricular  diastolic parameters are indeterminate.  2. Right ventricular systolic function is normal. The right ventricular  size is normal.  3. Left atrial size was mildly dilated.  4. Right atrial size was mildly dilated.  5. The mitral valve is normal in structure. Trivial mitral valve  regurgitation. No evidence of mitral stenosis.  6. The aortic valve is tricuspid. Aortic valve regurgitation is not  visualized. No aortic stenosis is present.  7. The inferior vena cava is normal in size with greater than 50%  respiratory variability, suggesting right atrial pressure of 3 mmHg.  Telemetry: Normal sinus rhythm, sinus arrhythmia, occasional PACs.  No further SVT as of 04/07/2020.  Telemetry 04/16/2020: Normal sinus  rhythm.  Brief episodes of atrial tachycardia, followed by  EKG 03/27/2020 at 3:19: Supraventricular tachycardia 179 bpm, LVH per voltage criteria, ST-T changes most likely secondary to repolarization abnormality.   Scheduled Meds: . azithromycin  500 mg Oral Daily  . clobetasol cream   Topical BID  . dapsone  100 mg Oral Daily  . dextromethorphan  30 mg Oral BID  . ethambutol  1,200 mg Oral Daily  . fluconazole  400 mg Oral Daily  . furosemide  40 mg Oral Once  . pravastatin  20 mg Oral QPM  . rifabutin  300 mg Oral Daily   Continuous Infusions:  PRN Meds:.acetaminophen, acetaminophen **OR** [DISCONTINUED] acetaminophen, diphenhydrAMINE **OR** [DISCONTINUED] diphenhydrAMINE, [DISCONTINUED] ondansetron **OR** ondansetron (ZOFRAN) IV, oxyCODONE  Assessment/Plan:  Ricky [name] is a 65 y.o. AAM patient male whose past medical history and cardiac risk factors include: HIV, noncompliance, cigarette smoking, recent COVID-19 infection (January 2022), hypertension, protein calorie malnutrition, aortic atherosclerosis, cavitary consolidation in the left lung fields on recent CT imaging, emphysema.  1.  PSVT, most consistent with probably atrial tachycardia.  He has not had any further sustained episodes.  Over the past 2 weeks he has had brief episodes of atrial tachycardia followed by conversion pauses and also episodes of nonconducted PACs.  There is no significant high degree AV block.   2.  Primary hypertension, diltiazem discontinued yesterday due to pauses although asymptomatic.  Recommendations: Would not recommend any change in therapy for now, we could certainly watch and if he has persistence or recurrence of SVT, then consider restarting diltiazem.  In view of his age at 57, concern for high degree AV block is very low.  Please call if questions.   Adrian Prows, MD, [facility] 04/16/2020, 8:17 AM Office: [phone] Pager: [phone]

## 2020-04-16 NOTE — Progress Notes (Addendum)
PROGRESS NOTE        PATIENT DETAILS Name: Ricky [name] Age: 65 y.o. Sex: male Date of Birth: February 25, 1955 Admit Date: 03/26/2020 Admitting Physician Reubin Milan, MD PCP:Pcp, No  Brief Narrative: Patient is a 65 y.o. 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 2/22>> CT chest with contrast: Nodular/cavitary airspace consolidation in the left upper/lower lobes 2/22>> Echo: EF 50-55% 2/24>> CT head: No acute intracranial process 2/24>> nuclear stress test: No ischemia  Antimicrobial therapy: Amphotericin/flucytosine: 2/21>>3/9 Ethambutol: 3/8>> Rifabutin: 3/8>> Zithromax: 3/8>> Fluconazole: 3/9>>  Microbiology data: 2/21>> CSF culture: Cryptococcus neoformans 2/23>> sputum AFB smear negative 2/23>> sputum AFB DNA probe: Positive for M avium complex 2/24>> sputum AFB smear: Negative 2/24>> CSF culture: Cryptococcus neoformans 2/28>> CSF culture: No growth 3/8>> CSF Gram stain: Cryptococcus neoformans 3/8>> CSF culture: No growth  Procedures : LP>> 2/22, 2/24, 2/28, 3/8  Consults: Neurology, cardiology Terri Skains), ID, ophthalmology  DVT Prophylaxis : SCDs Start: 03/26/20 1946   Subjective: Patient in bed appears to be in no discomfort, no significant headache except mild pressure behind his eyeballs which has been present for several weeks, no chest or abdominal pain, no shortness of breath or cough   Assessment/Plan:  Cryptococcal meningitis: Overall improved-completed a course of IV amphotericin/flucytosine-has been transitioned to fluconazole.  Await final culture  results of LP from 3/8.  Discussed with ID-Dr. Johnny Bridge on 3/10-recommending continued inpatient until 3/14-to ensure that CSF cultures are negative before consideration of discharge.   We will request ID to please clarify oral antifungals and antibiotics that he needs to be on upon discharge with the desired duration  HIV/AIDS: Noncompliant-timing for initiation of ART defer to ID.  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  Histoplasma/blastomycosis antigen negative.   History of tuberculosis-apparently treated at the [facility] and April 2001  AKI with hyponatremia:  AKI could have been due to Bactrim, this was stopped by ID on 04/15/2020, however there is a possibility he might have developed some dehydration, urine electrolytes inconclusive, will challenge with IV fluids on 04/16/2020 and monitor, will check renal ultrasound as well.  Hyperkalemia: Probably due to Bactrim-we will repeat Lokelma 3 doses on 04/15/2020.  SVT: Some sinus pauses, discussed with Dr. Einar Gip cardiologist on 04/15/2020, for now monitor patient is asymptomatic, if SVT becomes an issue will be placed on Cardizem, currently SVT has resolved.  Chronic right eye blurriness: Evaluated by ophthalmology on 3/7-no evidence of infection.  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  History of cocaine use & Tobacco abuse - counseled to quit.  Recent COVID-19 infection: Asymptomatic-out of quarantine window.    Lab Results  Component Value Date   SARSCOV2NAA POSITIVE (A) 03/03/2020   Diet: Diet Order            Diet Heart Room service appropriate? Yes; Fluid consistency: Thin  Diet effective now                  Code Status: Full code  Family Communication: Patient will  update his family himself.  Disposition Plan: Status is: Inpatient  Remains  inpatient appropriate because:Inpatient level of care appropriate due to severity of illness   Dispo:  Patient From: Home  Planned Disposition: Home  Medically stable for discharge: No     Barriers to Discharge: Cryptococcal meningitis-awaiting CSF cultures to be final before consideration of discharge-Per discussion with ID on 3/10-potential discharge on 04/17/2020  Antimicrobial agents: Anti-infectives (From admission, onward)   Start     Dose/Rate Route Frequency Ordered Stop   04/16/20 1000  fluconazole (DIFLUCAN) tablet 400 mg        400 mg Oral Daily 04/15/20 1741     04/15/20 1015  dapsone tablet 100 mg        100 mg Oral Daily 04/15/20 0925     04/11/20 1815  sulfamethoxazole-trimethoprim (BACTRIM DS) 800-160 MG per tablet 1 tablet  Status:  Discontinued        1 tablet Oral Once per day on Mon Wed Fri 04/11/20 1722 04/15/20 0925   04/11/20 1200  fluconazole (DIFLUCAN) tablet 800 mg  Status:  Discontinued        800 mg Oral Daily 04/11/20 1052 04/15/20 1741   04/11/20 1000  azithromycin (ZITHROMAX) tablet 500 mg        500 mg Oral Daily 04/10/20 1038     04/10/20 1130  ethambutol (MYAMBUTOL) tablet 1,200 mg        1,200 mg Oral Daily 04/10/20 1038     04/10/20 1130  rifabutin (MYCOBUTIN) capsule 300 mg        300 mg Oral Daily 04/10/20 1038     03/28/20 1000  azithromycin (ZITHROMAX) tablet 1,200 mg  Status:  Discontinued        1,200 mg Oral Weekly 03/28/20 0912 04/10/20 1038   03/28/20 1000  sulfamethoxazole-trimethoprim (BACTRIM) 400-80 MG per tablet 1 tablet  Status:  Discontinued        1 tablet Oral Daily 03/28/20 0912 04/02/20 0919   03/27/20 2115  amphotericin B liposome (AMBISOME) 300 mg in dextrose 5 % 500 mL IVPB  Status:  Discontinued        300 mg 287.5 mL/hr over 120 Minutes Intravenous Every 24 hours 03/26/20 2024 03/26/20 2227   03/26/20 2227  amphotericin B liposome (AMBISOME) 300 mg in dextrose 5 % 500 mL IVPB  Status:  Discontinued        300 mg 287.5  mL/hr over 120 Minutes Intravenous Every 24 hours 03/26/20 2227 04/11/20 1052   03/26/20 1945  amphotericin B liposome (AMBISOME) 300 mg in dextrose 5 % 500 mL IVPB  Status:  Discontinued        300 mg 287.5 mL/hr over 120 Minutes Intravenous Every 24 hours 03/26/20 1850 03/26/20 2024   03/26/20 1900  flucytosine (ANCOBON) capsule 2,000 mg  Status:  Discontinued        25 mg/kg  80 kg Oral Every 6 hours 03/26/20 1850 04/11/20 1052       Time spent: 15- minutes-Greater than 50% of this time was spent in counseling, explanation of diagnosis, planning of further management, and coordination of care.  MEDICATIONS: Scheduled Meds: . azithromycin  500 mg Oral Daily  . clobetasol cream   Topical BID  . dapsone  100 mg Oral Daily  . dextromethorphan  30 mg Oral BID  . ethambutol  1,200 mg Oral Daily  . fluconazole  400 mg Oral Daily  . furosemide  40 mg Oral Once  . pravastatin  20 mg Oral QPM  . rifabutin  300 mg Oral Daily   Continuous Infusions:  PRN Meds:.acetaminophen, acetaminophen **OR** [DISCONTINUED] acetaminophen, diphenhydrAMINE **OR** [DISCONTINUED] diphenhydrAMINE, [DISCONTINUED] ondansetron **OR** ondansetron (ZOFRAN) IV, oxyCODONE   PHYSICAL EXAM: Vital signs: Vitals:   04/15/20 1952 04/16/20 0100 04/16/20 0437 04/16/20 0812  BP: 120/68 110/82 120/66 108/77  Pulse: 61 63 60 68  Resp: _0 Temp: 97.9 F (36.6 C) 98 F (36.7 C) 97.7 F (36.5 C) 97.7 F (36.5 C)  TempSrc: Oral Oral Oral Oral  SpO2: 98% 97% 98% 98%  Weight:      Height:       Filed Weights   03/26/20 1839 03/27/20 0141  Weight: 80 kg 80.8 kg   Body mass index is 23.5 kg/m.   Gen Exam:  Awake Alert, No new F.N deficits, Normal affect [place].AT,PERRAL Supple Neck,No JVD, No cervical lymphadenopathy appriciated.  Symmetrical Chest wall movement, Good air movement bilaterally, CTAB RRR,No Gallops, Rubs or new Murmurs, No Parasternal Heave +ve B.Sounds, Abd Soft, No tenderness, No  organomegaly appriciated, No rebound - guarding or rigidity. No Cyanosis, Clubbing or edema, No new Rash or bruise   I have personally reviewed following labs and imaging studies  LABORATORY DATA: CBC: Recent Labs  Lab 04/10/20 0352 04/11/20 0434 04/12/20 0409 04/13/20 0411  WBC 4.8 3.9* 3.3* 2.8*  HGB 11.4* 11.7* 12.2* 12.0*  HCT 32.6* 33.9* 33.9* 34.6*  MCV 95.6 96.3 94.4 96.6  PLT 307 290 278 175    Basic Metabolic Panel: Recent Labs  Lab 04/12/20 0409 04/13/20 0411 04/14/20 0236 04/15/20 0405 04/16/20 0227  NA 134* 134* 131* 128* 128*  K 4.6 5.1 5.4* 5.4* 4.7  CL 104 105 102 100 99  CO2 _1 GLUCOSE 102* 117* 107* 102* 126*  BUN 20 23 24* 21 26*  CREATININE 1.41* 1.61* 1.46* 1.39* 1.63*  CALCIUM 9.4 9.3 9.4 9.4 9.3  MG 1.8 1.8 2.1 1.8 1.8    GFR: Estimated Creatinine Clearance: 53.1 mL/min (A) (by C-G formula based on SCr of 1.63 mg/dL (H)).  Liver Function Tests: No results for input(s): AST, ALT, ALKPHOS, BILITOT, PROT, ALBUMIN in the last 168 hours. No results for input(s): LIPASE, AMYLASE in the last 168 hours. No results for input(s): AMMONIA in the last 168 hours.  Coagulation Profile: No results for input(s): INR, PROTIME in the last 168 hours.  Cardiac Enzymes: No results for input(s): CKTOTAL, CKMB, CKMBINDEX, TROPONINI in the last 168 hours.  BNP (last 3 results) No results for input(s): PROBNP in the last 8760 hours.  Lipid Profile: No results for input(s): CHOL, HDL, LDLCALC, TRIG, CHOLHDL, LDLDIRECT in the last 72 hours.  Thyroid Function Tests: No results for input(s): TSH, T4TOTAL, FREET4, T3FREE, THYROIDAB in the last 72 hours.  Anemia Panel: No results for input(s): VITAMINB12, FOLATE, FERRITIN, TIBC, IRON, RETICCTPCT in the last 72 hours.  Urine analysis:    Component Value Date/Time   COLORURINE YELLOW 04/15/2020 1455   APPEARANCEUR HAZY (A) 04/15/2020 1455   LABSPEC 1.006 04/15/2020 1455   PHURINE 5.0  04/15/2020 1455   GLUCOSEU NEGATIVE 04/15/2020 1455   HGBUR SMALL (A) 04/15/2020 1455   BILIRUBINUR NEGATIVE 04/15/2020 1455   KETONESUR NEGATIVE 04/15/2020 1455   PROTEINUR NEGATIVE 04/15/2020 1455   NITRITE NEGATIVE 04/15/2020 1455   LEUKOCYTESUR LARGE (A) 04/15/2020 1455    Sepsis Labs: Lactic Acid, Venous    Component Value Date/Time   LATICACIDVEN 0.9 03/26/2020 1025  MICROBIOLOGY: Recent Results (from the past 240 hour(s))  CSF culture w Stat Gram Stain     Status: None   Collection Time: 04/10/20  5:18 PM   Specimen: CSF; Cerebrospinal Fluid  Result Value Ref Range Status   Specimen Description CSF  Final   Special Requests NONE  Final   Gram Stain   Final    WBC PRESENT, PREDOMINANTLY MONONUCLEAR CORRECTED RESULTS NO ORGANISMS SEEN PREVIOUSLY REPORTED AS: CRYPTOCOCCUS NEOFORMANS CYTOSPIN SMEAR CRITICAL RESULT CALLED TO, READ BACK BY AND VERIFIED WITH: RN Ethlyn Daniels 1050 [id number] FCP    Culture   Final    NO GROWTH 3 DAYS Performed at [facility], [place] [address] [place], [place] [address]    Report Status 04/14/2020 FINAL  Final  Culture, fungus without smear     Status: None (Preliminary result)   Collection Time: 04/10/20  5:18 PM   Specimen: CSF; Other  Result Value Ref Range Status   Specimen Description CSF  Final   Special Requests NONE  Final   Culture   Final    NO FUNGUS ISOLATED AFTER 5 DAYS Performed at [facility], [address] [place], [place] [address]    Report Status PENDING  Incomplete    RADIOLOGY STUDIES/RESULTS: No results found.   LOS: 21 days   Lala Lund, MD  [facility]   04/16/2020, 9:57 AM

## 2020-04-17 ENCOUNTER — Other Ambulatory Visit: Payer: Self-pay | Admitting: Internal Medicine

## 2020-04-17 DIAGNOSIS — B20 Human immunodeficiency virus [HIV] disease: Secondary | ICD-10-CM | POA: Diagnosis not present

## 2020-04-17 DIAGNOSIS — R9431 Abnormal electrocardiogram [ECG] [EKG]: Secondary | ICD-10-CM | POA: Diagnosis not present

## 2020-04-17 DIAGNOSIS — F17200 Nicotine dependence, unspecified, uncomplicated: Secondary | ICD-10-CM

## 2020-04-17 DIAGNOSIS — I700 Atherosclerosis of aorta: Secondary | ICD-10-CM

## 2020-04-17 DIAGNOSIS — B451 Cerebral cryptococcosis: Secondary | ICD-10-CM | POA: Diagnosis not present

## 2020-04-17 DIAGNOSIS — R519 Headache, unspecified: Secondary | ICD-10-CM | POA: Diagnosis not present

## 2020-04-17 LAB — BASIC METABOLIC PANEL
Anion gap: 8 (ref 5–15)
BUN: 27 mg/dL — ABNORMAL HIGH (ref 8–23)
CO2: 23 mmol/L (ref 22–32)
Calcium: 9.2 mg/dL (ref 8.9–10.3)
Chloride: 97 mmol/L — ABNORMAL LOW (ref 98–111)
Creatinine, Ser: 1.29 mg/dL — ABNORMAL HIGH (ref 0.61–1.24)
GFR, Estimated: >60 mL/min (ref >60)
Glucose, Bld: 134 mg/dL — ABNORMAL HIGH (ref 70–99)
Potassium: 4.4 mmol/L (ref 3.5–5.1)
Sodium: 128 mmol/L — ABNORMAL LOW (ref 135–145)

## 2020-04-17 LAB — HEPATIC FUNCTION PANEL
ALT: 26 U/L (ref 0–44)
AST: 30 U/L (ref 15–41)
Albumin: 3.1 g/dL — ABNORMAL LOW (ref 3.5–5.0)
Alkaline Phosphatase: 162 U/L — ABNORMAL HIGH (ref 38–126)
Bilirubin, Direct: 0.1 mg/dL (ref 0.0–0.2)
Indirect Bilirubin: 0.8 mg/dL (ref 0.3–0.9)
Total Bilirubin: 0.9 mg/dL (ref 0.3–1.2)
Total Protein: 8 g/dL (ref 6.5–8.1)

## 2020-04-17 LAB — SODIUM: Sodium: 132 mmol/L — ABNORMAL LOW (ref 135–145)

## 2020-04-17 LAB — MAGNESIUM: Magnesium: 1.6 mg/dL — ABNORMAL LOW (ref 1.7–2.4)

## 2020-04-17 MED ORDER — FLUCONAZOLE 200 MG PO TABS: 400.0000 mg | ORAL_TABLET | Freq: Every day | ORAL | 1 refills | Status: AC

## 2020-04-17 MED ORDER — MAGNESIUM SULFATE 2 GM/50ML IV SOLN
2.0000 g | Freq: Once | INTRAVENOUS | Status: AC
  Administered 2020-04-17: 2 g via INTRAVENOUS
  Filled 2020-04-17: qty 50

## 2020-04-17 MED ORDER — ETHAMBUTOL HCL 400 MG PO TABS
1200.0000 mg | ORAL_TABLET | Freq: Every day | ORAL | 11 refills | Status: DC
Start: 2020-04-17 — End: 2020-10-26

## 2020-04-17 MED ORDER — TOLVAPTAN 15 MG PO TABS
15.0000 mg | ORAL_TABLET | Freq: Once | ORAL | Status: AC
  Administered 2020-04-17: 15 mg via ORAL
  Filled 2020-04-17: qty 1

## 2020-04-17 MED ORDER — SULFAMETHOXAZOLE-TRIMETHOPRIM 400-80 MG PO TABS
1.0000 | ORAL_TABLET | Freq: Every day | ORAL | Status: DC
  Administered 2020-04-17 – 2020-04-18 (×2): 1 via ORAL
  Filled 2020-04-17 (×2): qty 1

## 2020-04-17 MED ORDER — AZITHROMYCIN 500 MG PO TABS: 500.0000 mg | ORAL_TABLET | Freq: Every day | ORAL | 11 refills | Status: DC

## 2020-04-17 MED ORDER — SULFAMETHOXAZOLE-TRIMETHOPRIM 400-80 MG PO TABS: 1.0000 | ORAL_TABLET | Freq: Every day | ORAL | 11 refills | Status: DC

## 2020-04-17 MED ORDER — RIFABUTIN 150 MG PO CAPS: 300.0000 mg | ORAL_CAPSULE | Freq: Every day | ORAL | 11 refills | Status: DC

## 2020-04-17 NOTE — Progress Notes (Signed)
[facility]  Date of Admission:  03/26/2020     Total days of antibiotics 22         ASSESSMENT:  Ricky [name] has completed induction therapy for cryptococcal meningitis and currently on consolidation therapy with fluconazole. Course is complicated by findings of MAC infection which is currently being treated with azithromycin, ethambutol and rifampin. For PCP prophylaxis he has G6PD deficiency and unable to use dapsone. Interactions with other medications with atovaquone. Will retry single strength Bactrim daily and monitor renal function. Thus will be discharged with cryptococcal treatment with fluconazole, MAC treatment with ethambutol, azithromycin, and rifampin, and PCP prophylaxis with Bactrim. Arrange close follow up in 1 week with Dr. Gale Journey. Pharmacy working on medications for discharge.   PLAN:  1. Continue fluconazole, ethambutol, azithromycin, rifampin, and Bactrim. 2. Hold ART medications.  3. Sodium management per primary team 4. Close follow up with ID scheduled in 1 week with Dr. Gale Journey.  Principal Problem:   Cryptococcal meningitis ([place]) Active Problems:   Leukocytopenia   Macrocytosis   Moderate protein malnutrition (H[facility])   Hypoalbuminemia   AIDS (acquired immune deficiency syndrome) (HCC)   Atrial flutter (HCC)   Aortic atherosclerosis (HCC)   Hypertension   SVT (supraventricular tachycardia) (HCC)   Abnormal EKG   Acute nonintractable headache   Smoking   Cocaine use   Cavitary lesion of lung   AKI (acute kidney injury) ([place])   Blurry vision, left eye   Mycobacterium avium infection ([place])   . azithromycin  500 mg Oral Daily  . clobetasol cream   Topical BID  . dextromethorphan  30 mg Oral BID  . ethambutol  1,200 mg Oral Daily  . fluconazole  400 mg Oral Daily  . pravastatin  20 mg Oral QPM  . rifabutin  300 mg Oral Daily  . sulfamethoxazole-trimethoprim  1 tablet Oral Daily    SUBJECTIVE:  Afebrile overnight with no acute  events. Having pressure around left eye.   No Known Allergies   Review of Systems: Review of Systems  Constitutional: Negative for chills, fever and weight loss.  Respiratory: Negative for cough, shortness of breath and wheezing.   Cardiovascular: Negative for chest pain and leg swelling.  Gastrointestinal: Negative for abdominal pain, constipation, diarrhea, nausea and vomiting.  Skin: Negative for rash.      OBJECTIVE: Vitals:   04/16/20 2339 04/17/20 0402 04/17/20 0828 04/17/20 1219  BP: 126/86 123/89 130/87 133/85  Pulse: 67 73 74 76  Resp: _0 Temp: 97.6 F (36.4 C) (!) 97.4 F (36.3 C) 98 F (36.7 C) 98 F (36.7 C)  TempSrc: Oral Oral Oral Oral  SpO2: 96% 95% 98% 98%  Weight:      Height:       Body mass index is 23.5 kg/m.  Physical Exam Constitutional:      General: He is not in acute distress.    Appearance: He is well-developed.  Cardiovascular:     Rate and Rhythm: Normal rate and regular rhythm.     Heart sounds: Normal heart sounds.  Pulmonary:     Effort: Pulmonary effort is normal.     Breath sounds: Normal breath sounds.  Skin:    General: Skin is warm and dry.  Neurological:     Mental Status: He is alert and oriented to person, place, and time.  Psychiatric:        Behavior: Behavior normal.  Thought Content: Thought content normal.        Judgment: Judgment normal.     Lab Results Lab Results  Component Value Date   WBC 2.8 (L) 04/13/2020   HGB 12.9 (L) 04/15/2020   HCT 34.6 (L) 04/13/2020   MCV 96.6 04/13/2020   PLT 282 04/13/2020    Lab Results  Component Value Date   CREATININE 1.29 (H) 04/17/2020   BUN 27 (H) 04/17/2020   NA 128 (L) 04/17/2020   K 4.4 04/17/2020   CL 97 (L) 04/17/2020   CO2 23 04/17/2020    Lab Results  Component Value Date   ALT 26 04/17/2020   AST 30 04/17/2020   ALKPHOS 162 (H) 04/17/2020   BILITOT 0.9 04/17/2020     Microbiology: Recent Results (from the past 240 hour(s))   CSF culture w Stat Gram Stain     Status: None   Collection Time: 04/10/20  5:18 PM   Specimen: CSF; Cerebrospinal Fluid  Result Value Ref Range Status   Specimen Description CSF  Final   Special Requests NONE  Final   Gram Stain   Final    WBC PRESENT, PREDOMINANTLY MONONUCLEAR CORRECTED RESULTS NO ORGANISMS SEEN PREVIOUSLY REPORTED AS: CRYPTOCOCCUS NEOFORMANS CYTOSPIN SMEAR CRITICAL RESULT CALLED TO, READ BACK BY AND VERIFIED WITH: RN Ethlyn Daniels 1050 [id number] FCP    Culture   Final    NO GROWTH 3 DAYS Performed at [facility], [address] [place], [place] [address]    Report Status 04/14/2020 FINAL  Final  Culture, fungus without smear     Status: None (Preliminary result)   Collection Time: 04/10/20  5:18 PM   Specimen: CSF; Other  Result Value Ref Range Status   Specimen Description CSF  Final   Special Requests NONE  Final   Culture   Final    NO FUNGUS ISOLATED AFTER 7 DAYS Performed at [facility], [address], [place], [place] [address]    Report Status PENDING  Incomplete     Terri Piedra, [facility]  04/17/2020  12:34 PM

## 2020-04-17 NOTE — Plan of Care (Signed)

## 2020-04-17 NOTE — Progress Notes (Signed)
PROGRESS NOTE        PATIENT DETAILS Name: Ricky [name] Age: 65 y.o. Sex: male Date of Birth: 24-Aug-1955 Admit Date: 03/26/2020 Admitting Physician Reubin Milan, MD PCP:Pcp, No  Brief Narrative: Patient is a 65 y.o. 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 2/22>> CT chest with contrast: Nodular/cavitary airspace consolidation in the left upper/lower lobes 2/22>> Echo: EF 50-55% 2/24>> CT head: No acute intracranial process 2/24>> nuclear stress test: No ischemia  Antimicrobial therapy: Amphotericin/flucytosine: 2/21>>3/9 Ethambutol: 3/8>> Rifabutin: 3/8>> Zithromax: 3/8>> Fluconazole: 3/9>>  Microbiology data: 2/21>> CSF culture: Cryptococcus neoformans 2/23>> sputum AFB smear negative 2/23>> sputum AFB DNA probe: Positive for M avium complex 2/24>> sputum AFB smear: Negative 2/24>> CSF culture: Cryptococcus neoformans 2/28>> CSF culture: No growth 3/8>> CSF Gram stain: Cryptococcus neoformans 3/8>> CSF culture: No growth  Procedures : LP>> 2/22, 2/24, 2/28, 3/8  Consults: Neurology, cardiology Terri Skains), ID, ophthalmology  DVT Prophylaxis : SCDs Start: 03/26/20 1946   Subjective: Patient in bed, appears comfortable, denies any headache currently continues to have some fullness above her eyes bilaterally, no fever, no chest pain or pressure, no shortness of breath , no abdominal pain. No focal weakness.  Assessment/Plan:  Cryptococcal meningitis: Overall improved-completed a course of IV amphotericin/flucytosine-has been transitioned to fluconazole.  Await  final culture results of LP from 3/8.  Appreciate ID follow-up currently on fluconazole with end date 06/10/2020.  Outpatient follow-up with Dr. Gale Journey ID.   HIV/AIDS: Noncompliant-timing for initiation of ART defer to ID.  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  Currently on triple therapy.  Will follow with ID in 2 to 3 weeks post discharge for follow-up post DC.   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  SVT: Some sinus pauses, discussed with Dr. GanEinar Giprdiologist on 04/15/2020, for now monitor patient is asymptomatic, if SVT becomes an issue will be placed on Cardizem, currently SVT has resolved.  Chronic right eye blurriness: Evaluated by ophthalmology on 3/7-no evidence of infection.  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  History of cocaine use & Tobacco abuse - counseled to quit.  Recent COVID-19 infection: Asymptomatic-out of quarantine window.    Lab Results  Component Value Date   SARSCOV2NAA POSITIVE (A) 03/03/2020   Diet: Diet Order            Diet Heart Room service appropriate? Yes; Fluid consistency: Thin  Diet effective now                  Code Status: Full code  Family Communication: Patient will update his family himself.  Disposition Plan: Status is: Inpatient  Remains inpatient appropriate because:Inpatient level of care appropriate due to severity of illness  Dispo:  Patient From:    Planned Disposition:     Medically stable for discharge:       Barriers to Discharge: Cryptococcal meningitis-awaiting CSF cultures to be final before consideration of discharge-Per discussion with ID on 3/10-potential discharge on 04/18/2020 if sodium levels are better.  Antimicrobial agents: Anti-infectives (From admission, onward)   Start     Dose/Rate Route Frequency Ordered Stop   04/16/20 1000  fluconazole (DIFLUCAN) tablet 400 mg        400 mg Oral Daily 04/15/20 1741     04/15/20 1015  dapsone tablet 100 mg        100 mg Oral Daily 04/15/20 0925     04/11/20 1815  sulfamethoxazole-trimethoprim (BACTRIM DS) 800-160 MG per tablet 1 tablet  Status:  Discontinued        1 tablet Oral Once per day on Mon Wed Fri 04/11/20 1722 04/15/20 0925   04/11/20 1200  fluconazole (DIFLUCAN) tablet 800 mg  Status:  Discontinued        800 mg Oral Daily 04/11/20 1052 04/15/20 1741   04/11/20 1000  azithromycin (ZITHROMAX) tablet 500 mg        500 mg Oral Daily 04/10/20 1038     04/10/20 1130  ethambutol (MYAMBUTOL) tablet 1,200 mg        1,200 mg Oral Daily 04/10/20 1038     04/10/20 1130  rifabutin (MYCOBUTIN) capsule 300 mg        300 mg Oral Daily 04/10/20 1038     03/28/20 1000  azithromycin (ZITHROMAX) tablet 1,200 mg  Status:  Discontinued        1,200 mg Oral Weekly 03/28/20 0912 04/10/20 1038   03/28/20 1000  sulfamethoxazole-trimethoprim (BACTRIM) 400-80 MG per tablet 1 tablet  Status:  Discontinued        1 tablet Oral Daily 03/28/20 0912 04/02/20 0919   03/27/20 2115  amphotericin B liposome (AMBISOME) 300 mg in dextrose 5 % 500 mL IVPB  Status:  Discontinued        300 mg 287.5 mL/hr over 120 Minutes Intravenous Every 24 hours 03/26/20 2024 03/26/20 2227   03/26/20 2227  amphotericin B liposome (AMBISOME) 300 mg in dextrose 5 % 500 mL IVPB  Status:  Discontinued        300 mg 287.5 mL/hr over 120 Minutes Intravenous Every 24 hours 03/26/20 2227 04/11/20 1052   03/26/20 1945  amphotericin B liposome  (AMBISOME) 300 mg in dextrose 5 % 500 mL IVPB  Status:  Discontinued        300 mg 287.5 mL/hr over 120 Minutes Intravenous Every 24 hours 03/26/20 1850 03/26/20 2024   03/26/20 1900  flucytosine (ANCOBON) capsule 2,000 mg  Status:  Discontinued        25 mg/kg  80 kg Oral Every 6 hours 03/26/20 1850 04/11/20 1052       Time spent: 15- minutes-Greater than 50% of this time was spent in counseling, explanation of diagnosis, planning of further management, and coordination of care.  MEDICATIONS: Scheduled Meds: . azithromycin  500 mg Oral Daily  . clobetasol cream   Topical BID  . dapsone  100 mg Oral Daily  . dextromethorphan  30 mg Oral BID  . ethambutol  1,200 mg Oral Daily  . fluconazole  400 mg Oral Daily  . pravastatin  20 mg Oral QPM  . rifabutin  300 mg Oral Daily   Continuous Infusions: . lactated ringers 100 mL/hr at 04/17/20 0141  .  magnesium sulfate bolus IVPB 2 g (04/17/20 0823)   PRN Meds:.acetaminophen, acetaminophen **OR** [DISCONTINUED] acetaminophen, diphenhydrAMINE **OR** [DISCONTINUED] diphenhydrAMINE, [DISCONTINUED] ondansetron **OR** ondansetron (ZOFRAN) IV, oxyCODONE   PHYSICAL EXAM: Vital signs: Vitals:   04/16/20 2005 04/16/20 2339 04/17/20 0402 04/17/20 0828  BP: 118/84 126/86 123/89 130/87  Pulse: 66 67 73 74  Resp: _0 Temp: 97.9 F (36.6 C) 97.6 F (36.4 C) (!) 97.4 F (36.3 C) 98 F (36.7 C)  TempSrc: Oral Oral Oral Oral  SpO2: 98% 96% 95% 98%  Weight:      Height:       Filed Weights   03/26/20 1839 03/27/20 0141  Weight: 80 kg 80.8 kg   Body mass index is 23.5 kg/m.   Gen Exam:  Awake Alert, No new F.N deficits, Normal affect [place].AT,PERRAL Supple Neck,No JVD, No cervical lymphadenopathy appriciated.  Symmetrical Chest wall movement, Good air movement bilaterally, CTAB RRR,No Gallops, Rubs or new Murmurs, No Parasternal Heave +ve B.Sounds, Abd Soft, No tenderness, No organomegaly appriciated, No rebound - guarding or  rigidity. No Cyanosis, Clubbing or edema, No new Rash or bruise    I have personally reviewed following labs and imaging studies  LABORATORY DATA: CBC: Recent Labs  Lab 04/11/20 0434 04/12/20 0409 04/13/20 0411 04/15/20 0405  WBC 3.9* 3.3* 2.8*  --   HGB 11.7* 12.2* 12.0* 12.9*  HCT 33.9* 33.9* 34.6*  --   MCV 96.3 94.4 96.6  --   PLT 290 278 282  --     Basic Metabolic Panel: Recent Labs  Lab 04/13/20 0411 04/14/20 0236 04/15/20 0405 04/16/20 0227 04/17/20 0143  NA 134* 131* 128* 128* 128*  K 5.1 5.4* 5.4* 4.7 4.4  CL 105 102 100 99 97*  CO2 _1 GLUCOSE 117* 107* 102* 126* 134*  BUN 23 24* 21 26* 27*  CREATININE 1.61* 1.46* 1.39* 1.63* 1.29*  CALCIUM 9.3 9.4 9.4 9.3 9.2  MG 1.8 2.1 1.8 1.8 1.6*    GFR: Estimated Creatinine Clearance: 67.1 mL/min (A) (by C-G formula based on SCr of 1.29 mg/dL (H)).  Liver Function Tests: No results for input(s): AST, ALT, ALKPHOS, BILITOT, PROT, ALBUMIN in the last 168 hours. No results for input(s): LIPASE, AMYLASE in the last 168 hours. No results for input(s): AMMONIA in the last 168 hours.  Coagulation Profile: No results for input(s): INR, PROTIME in the last 168 hours.  Cardiac Enzymes: No results for input(s): CKTOTAL, CKMB, CKMBINDEX, TROPONINI in the last 168 hours.  BNP (last 3 results) No results for input(s): PROBNP in the last 8760 hours.  Lipid Profile: No results for input(s): CHOL, HDL, LDLCALC, TRIG, CHOLHDL, LDLDIRECT in the last 72 hours.  Thyroid Function Tests: No results for input(s): TSH, T4TOTAL, FREET4, T3FREE, THYROIDAB in the last 72 hours.  Anemia Panel: No results for input(s): VITAMINB12, FOLATE, FERRITIN, TIBC, IRON, RETICCTPCT in the last 72 hours.  Urine analysis:    Component Value Date/Time   COLORURINE YELLOW 04/15/2020 1455   APPEARANCEUR HAZY (A) 04/15/2020 1455   LABSPEC 1.006 04/15/2020 1455   PHURINE 5.0 04/15/2020 1455   GLUCOSEU NEGATIVE 04/15/2020 1455    HGBUR SMALL (A) 04/15/2020 1455   BILIRUBINUR NEGATIVE 04/15/2020 1455   KETONESUR NEGATIVE 04/15/2020 1455   PROTEINUR NEGATIVE 04/15/2020 1455   NITRITE NEGATIVE 04/15/2020 1455   LEUKOCYTESUR LARGE (A) 04/15/2020 1455    Sepsis Labs: Lactic Acid, Venous    Component Value Date/Time   LATICACIDVEN 0.9  03/26/2020 0958    MICROBIOLOGY: Recent Results (from the past 240 hour(s))  CSF culture w Stat Gram Stain     Status: None   Collection Time: 04/10/20  5:18 PM   Specimen: CSF; Cerebrospinal Fluid  Result Value Ref Range Status   Specimen Description CSF  Final   Special Requests NONE  Final   Gram Stain   Final    WBC PRESENT, PREDOMINANTLY MONONUCLEAR CORRECTED RESULTS NO ORGANISMS SEEN PREVIOUSLY REPORTED AS: CRYPTOCOCCUS NEOFORMANS CYTOSPIN SMEAR CRITICAL RESULT CALLED TO, READ BACK BY AND VERIFIED WITH: RN Ethlyn Daniels 1050 [id number] FCP    Culture   Final    NO GROWTH 3 DAYS Performed at [facility], [place] [address] [place], [place] [address]    Report Status 04/14/2020 FINAL  Final  Culture, fungus without smear     Status: None (Preliminary result)   Collection Time: 04/10/20  5:18 PM   Specimen: CSF; Other  Result Value Ref Range Status   Specimen Description CSF  Final   Special Requests NONE  Final   Culture   Final    NO FUNGUS ISOLATED AFTER 6 DAYS Performed at [facility], [address], [place], [place] [address]    Report Status PENDING  Incomplete    RADIOLOGY STUDIES/RESULTS: No results found.   LOS: 22 days   Lala Lund, MD  [facility]   04/17/2020, 8:45 AM

## 2020-04-18 DIAGNOSIS — E875 Hyperkalemia: Secondary | ICD-10-CM

## 2020-04-18 DIAGNOSIS — B20 Human immunodeficiency virus [HIV] disease: Secondary | ICD-10-CM | POA: Diagnosis not present

## 2020-04-18 DIAGNOSIS — A310 Pulmonary mycobacterial infection: Secondary | ICD-10-CM | POA: Diagnosis not present

## 2020-04-18 DIAGNOSIS — B451 Cerebral cryptococcosis: Secondary | ICD-10-CM | POA: Diagnosis not present

## 2020-04-18 LAB — BASIC METABOLIC PANEL
Anion gap: 8 (ref 5–15)
BUN: 21 mg/dL (ref 8–23)
CO2: 22 mmol/L (ref 22–32)
Calcium: 9.6 mg/dL (ref 8.9–10.3)
Chloride: 102 mmol/L (ref 98–111)
Creatinine, Ser: 1.4 mg/dL — ABNORMAL HIGH (ref 0.61–1.24)
GFR, Estimated: 57 mL/min — ABNORMAL LOW (ref >60)
Glucose, Bld: 97 mg/dL (ref 70–99)
Potassium: 5.4 mmol/L — ABNORMAL HIGH (ref 3.5–5.1)
Sodium: 132 mmol/L — ABNORMAL LOW (ref 135–145)

## 2020-04-18 LAB — FUNGUS CULTURE WITH STAIN

## 2020-04-18 LAB — AFB ORGANISM ID BY DNA PROBE
M avium complex: POSITIVE — AB
M tuberculosis complex: NEGATIVE

## 2020-04-18 LAB — MAC SUSCEPTIBILITY BROTH
Amikacin: 8
Ciprofloxacin: >8
Clarithromycin: 2
Linezolid: >32
Rifampin: 4
Streptomycin: 32

## 2020-04-18 LAB — MAGNESIUM: Magnesium: 1.9 mg/dL (ref 1.7–2.4)

## 2020-04-18 LAB — SODIUM: Sodium: 131 mmol/L — ABNORMAL LOW (ref 135–145)

## 2020-04-18 LAB — ACID FAST CULTURE WITH REFLEXED SENSITIVITIES (MYCOBACTERIA): Acid Fast Culture: POSITIVE — AB

## 2020-04-18 LAB — FUNGAL ORGANISM REFLEX

## 2020-04-18 LAB — FUNGUS CULTURE RESULT

## 2020-04-18 MED ORDER — ATOVAQUONE 750 MG/5ML PO SUSP
1500.0000 mg | Freq: Every day | ORAL | Status: DC
  Administered 2020-04-19 – 2020-04-25 (×7): 1500 mg via ORAL
  Filled 2020-04-18 (×7): qty 10

## 2020-04-18 MED ORDER — DILTIAZEM HCL 25 MG/5ML IV SOLN
10.0000 mg | Freq: Once | INTRAVENOUS | Status: AC
  Administered 2020-04-18: 10 mg via INTRAVENOUS
  Filled 2020-04-18: qty 5

## 2020-04-18 MED ORDER — SODIUM POLYSTYRENE SULFONATE 15 GM/60ML PO SUSP
30.0000 g | Freq: Once | ORAL | Status: AC
  Administered 2020-04-18: 30 g via ORAL
  Filled 2020-04-18: qty 120

## 2020-04-18 MED ORDER — DILTIAZEM HCL 30 MG PO TABS
30.0000 mg | ORAL_TABLET | Freq: Two times a day (BID) | ORAL | Status: DC
  Administered 2020-04-18 – 2020-04-22 (×9): 30 mg via ORAL
  Filled 2020-04-18 (×10): qty 1

## 2020-04-18 MED ORDER — LACTATED RINGERS IV BOLUS: 1000.0000 mL | Freq: Once | INTRAVENOUS | Status: DC

## 2020-04-18 MED ORDER — ATOVAQUONE 750 MG/5ML PO SUSP
1500.0000 mg | Freq: Every day | ORAL | 11 refills | Status: DC
Start: 2020-04-18 — End: 2020-10-26

## 2020-04-18 MED ORDER — RIFABUTIN 150 MG PO CAPS: 300.0000 mg | ORAL_CAPSULE | Freq: Every day | ORAL | 11 refills | Status: DC

## 2020-04-18 NOTE — Plan of Care (Signed)

## 2020-04-18 NOTE — Progress Notes (Signed)
RN paged with patient HR 180, diaphoresis, light headedness. Per progress note: "Some sinus pauses, discussed with Dr. Einar Gip cardiologist on 04/15/2020, for now monitor patient is asymptomatic, if SVT becomes an issue will be placed on Cardizem, currently SVT has resolved."  Will give cardizem 10mg  IV, followed by cardizem PO. BP is low normal at the moment, may improve with control of HR, but if it decreases with cardizem, will follow up with bolus.

## 2020-04-18 NOTE — Progress Notes (Signed)
PROGRESS NOTE        PATIENT DETAILS Name: Ricky [name] Age: 65 y.o. Sex: male Date of Birth: 1955-07-12 Admit Date: 03/26/2020 Admitting Physician Reubin Milan, MD PCP:Pcp, No  Brief Narrative: Patient is a 65 y.o. 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 2/22>> CT chest with contrast: Nodular/cavitary airspace consolidation in the left upper/lower lobes 2/22>> Echo: EF 50-55% 2/24>> CT head: No acute intracranial process 2/24>> nuclear stress test: No ischemia  Antimicrobial therapy: Amphotericin/flucytosine: 2/21>>3/9 Ethambutol: 3/8>> Rifabutin: 3/8>> Zithromax: 3/8>> Fluconazole: 3/9>>  Microbiology data: 2/21>> CSF culture: Cryptococcus neoformans 2/23>> sputum AFB smear negative 2/23>> sputum AFB DNA probe: Positive for M avium complex 2/24>> sputum AFB smear: Negative 2/24>> CSF culture: Cryptococcus neoformans 2/28>> CSF culture: No growth 3/8>> CSF Gram stain: Cryptococcus neoformans 3/8>> CSF culture: No growth  Procedures : LP>> 2/22, 2/24, 2/28, 3/8  Consults: Neurology, cardiology Terri Skains), ID, ophthalmology  DVT Prophylaxis : SCDs Start: 03/26/20 1946   Subjective: Patient in bed, appears comfortable, denies any headache currently continues to have some fullness above her eyes bilaterally, no fever, no chest pain or pressure, no shortness of breath , no abdominal pain. No focal weakness.  Assessment/Plan:  Cryptococcal meningitis: Overall improved-completed a course of IV amphotericin/flucytosine-has been transitioned to fluconazole.  Await  final culture results of LP from 3/8.  Appreciate ID follow-up currently on fluconazole with end date 06/10/2020.  Outpatient follow-up with Dr. Gale Journey ID.   HIV/AIDS: Noncompliant-timing for initiation of ART defer to ID.  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  Currently on triple therapy.  Will follow with ID in 2 to 3 weeks post discharge for follow-up post DC.   Azithromycin 517m daily  Rifabutin 3081mdaily  Ethambutol 120059maily  History of tuberculosis-apparently treated at the [facility] April 2001  AKI with hyponatremia: Likely SIADH much [place] the [place] will be continued.  SVT: Some sinus pauses, discussed with Dr. GanEinar Giprdiologist on 04/15/2020, for now monitor patient is asymptomatic, if SVT becomes an issue will be placed on Cardizem, currently SVT has resolved.  Chronic right eye blurriness: Evaluated by ophthalmology on 3/7-no evidence of infection.  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  History of cocaine use & Tobacco abuse - counseled to quit.  Recent COVID-19 infection: Asymptomatic-out of quarantine window.  Hyperkalemia.  Due to Bactrim.  Lokelma and Kayexalate, ID has Bactrim outcome 04/19/2018 completed we monitor.   Lab Results  Component Value Date   SARSCOV2NAA POSITIVE (A) 03/03/2020   Diet: Diet Order            Diet Heart Room service appropriate? Yes; Fluid consistency: Thin  Diet effective now                  Code Status: Full code  Family Communication: Patient will update his family himself.  Disposition Plan: Status is: Inpatient  Remains inpatient appropriate because:Inpatient level of care appropriate due to severity of illness   Dispo:  Patient  From:    Planned Disposition:    Medically stable for discharge:       Barriers to  Discharge: Cryptococcal meningitis-awaiting CSF cultures to be final before consideration of discharge-Per discussion with ID on 3/10-potential discharge on 04/18/2020 if sodium levels are better.  Antimicrobial agents: Anti-infectives (From admission, onward)   Start     Dose/Rate Route Frequency Ordered Stop   04/19/20 1000  atovaquone (MEPRON) 750 MG/5ML suspension 1,500 mg        1,500 mg Oral Daily 04/18/20 0941     04/18/20 0000  rifabutin (MYCOBUTIN) 150 MG capsule        300 mg Oral Daily 04/18/20 0935     04/18/20 0000  atovaquone (MEPRON) 750 MG/5ML suspension        1,500 mg Oral Daily 04/18/20 0935     04/17/20 1015  sulfamethoxazole-trimethoprim (BACTRIM) 400-80 MG per tablet 1 tablet  Status:  Discontinued        1 tablet Oral Daily 04/17/20 0923 04/18/20 0941   04/17/20 0000  fluconazole (DIFLUCAN) 200 MG tablet        400 mg Oral Daily 04/17/20 1140 06/10/20 2359   04/17/20 0000  rifabutin (MYCOBUTIN) 150 MG capsule  Status:  Discontinued        300 mg Oral Daily 04/17/20 1140 04/18/20    04/17/20 0000  azithromycin (ZITHROMAX) 500 MG tablet        500 mg Oral Daily 04/17/20 1140     04/17/20 0000  ethambutol (MYAMBUTOL) 400 MG tablet        1,200 mg Oral Daily 04/17/20 1140     04/17/20 0000  sulfamethoxazole-trimethoprim (BACTRIM) 400-80 MG tablet  Status:  Discontinued        1 tablet Oral Daily 04/17/20 1140 04/18/20    04/16/20 1000  fluconazole (DIFLUCAN) tablet 400 mg        400 mg Oral Daily 04/15/20 1741     04/15/20 1015  dapsone tablet 100 mg  Status:  Discontinued        100 mg Oral Daily 04/15/20 0925 04/17/20 0923   04/11/20 1815  sulfamethoxazole-trimethoprim (BACTRIM DS) 800-160 MG per tablet 1 tablet  Status:  Discontinued        1 tablet Oral Once per day on Mon Wed Fri 04/11/20 1722 04/15/20 0925   04/11/20 1200  fluconazole (DIFLUCAN) tablet 800 mg  Status:  Discontinued        800 mg Oral Daily 04/11/20 1052 04/15/20 1741   04/11/20 1000   azithromycin (ZITHROMAX) tablet 500 mg        500 mg Oral Daily 04/10/20 1038     04/10/20 1130  ethambutol (MYAMBUTOL) tablet 1,200 mg        1,200 mg Oral Daily 04/10/20 1038     04/10/20 1130  rifabutin (MYCOBUTIN) capsule 300 mg        300 mg Oral Daily 04/10/20 1038     03/28/20 1000  azithromycin (ZITHROMAX) tablet 1,200 mg  Status:  Discontinued        1,200 mg Oral Weekly 03/28/20 0912 04/10/20 1038   03/28/20 1000  sulfamethoxazole-trimethoprim (BACTRIM) 400-80 MG per tablet 1 tablet  Status:  Discontinued        1 tablet Oral Daily 03/28/20 0912 04/02/20 0919   03/27/20 2115  amphotericin B liposome (AMBISOME) 300 mg in dextrose 5 % 500 mL IVPB  Status:  Discontinued        300 mg 287.5 mL/hr over  120 Minutes Intravenous Every 24 hours 03/26/20 2024 03/26/20 2227   03/26/20 2227  amphotericin B liposome (AMBISOME) 300 mg in dextrose 5 % 500 mL IVPB  Status:  Discontinued        300 mg 287.5 mL/hr over 120 Minutes Intravenous Every 24 hours 03/26/20 2227 04/11/20 1052   03/26/20 1945  amphotericin B liposome (AMBISOME) 300 mg in dextrose 5 % 500 mL IVPB  Status:  Discontinued        300 mg 287.5 mL/hr over 120 Minutes Intravenous Every 24 hours 03/26/20 1850 03/26/20 2024   03/26/20 1900  flucytosine (ANCOBON) capsule 2,000 mg  Status:  Discontinued        25 mg/kg  80 kg Oral Every 6 hours 03/26/20 1850 04/11/20 1052       Time spent: 15- minutes-Greater than 50% of this time was spent in counseling, explanation of diagnosis, planning of further management, and coordination of care.  MEDICATIONS: Scheduled Meds: . [START ON 04/19/2020] atovaquone  1,500 mg Oral Daily  . azithromycin  500 mg Oral Daily  . clobetasol cream   Topical BID  . dextromethorphan  30 mg Oral BID  . ethambutol  1,200 mg Oral Daily  . fluconazole  400 mg Oral Daily  . pravastatin  20 mg Oral QPM  . rifabutin  300 mg Oral Daily   Continuous Infusions: . lactated ringers Stopped (04/18/20 0743)    PRN Meds:.acetaminophen, acetaminophen **OR** [DISCONTINUED] acetaminophen, diphenhydrAMINE **OR** [DISCONTINUED] diphenhydrAMINE, [DISCONTINUED] ondansetron **OR** ondansetron (ZOFRAN) IV, oxyCODONE   PHYSICAL EXAM: Vital signs: Vitals:   04/17/20 2048 04/17/20 2355 04/18/20 0413 04/18/20 0750  BP: (!) 140/91 116/70 104/83 115/77  Pulse: 72 93 76 77  Resp: _0 Temp: 98.4 F (36.9 C) 98.2 F (36.8 C) 98.6 F (37 C) 98.6 F (37 C)  TempSrc: Oral Oral Oral Oral  SpO2: 97% 96% 95% 95%  Weight:      Height:       Filed Weights   03/26/20 1839 03/27/20 0141  Weight: 80 kg 80.8 kg   Body mass index is 23.5 kg/m.   Gen Exam:  Awake Alert, No new F.N deficits, Normal affect .AT,PERRAL Supple Neck,No JVD, No cervical lymphadenopathy appriciated.  Symmetrical Chest wall movement, Good air movement bilaterally, CTAB RRR,No Gallops, Rubs or new Murmurs, No Parasternal Heave +ve B.Sounds, Abd Soft, No tenderness, No organomegaly appriciated, No rebound - guarding or rigidity. No Cyanosis, Clubbing or edema, No new Rash or bruise    I have personally reviewed following labs and imaging studies  LABORATORY DATA: CBC: Recent Labs  Lab 04/12/20 0409 04/13/20 0411 04/15/20 0405  WBC 3.3* 2.8*  --   HGB 12.2* 12.0* 12.9*  HCT 33.9* 34.6*  --   MCV 94.4 96.6  --   PLT 278 282  --     Basic Metabolic Panel: Recent Labs  Lab 04/14/20 0236 04/15/20 0405 04/16/20 0227 04/17/20 0143 04/17/20 1422 04/18/20 0006 04/18/20 0343  NA 131* 128* 128* 128* 132* 131* 132*  K 5.4* 5.4* 4.7 4.4  --   --  5.4*  CL 102 100 99 97*  --   --  102  CO2 _1 --   --  22  GLUCOSE 107* 102* 126* 134*  --   --  97  BUN 24* 21 26* 27*  --   --  21  CREATININE 1.46* 1.39* 1.63* 1.29*  --   --  1.40*  CALCIUM 9.4 9.4 9.3 9.2  --   --  9.6  MG 2.1 1.8 1.8 1.6*  --   --  1.9    GFR: Estimated Creatinine Clearance: 61.8 mL/min (A) (by C-G formula based on SCr of 1.4  mg/dL (H)).  Liver Function Tests: Recent Labs  Lab 04/17/20 0750  AST 30  ALT 26  ALKPHOS 162*  BILITOT 0.9  PROT 8.0  ALBUMIN 3.1*   No results for input(s): LIPASE, AMYLASE in the last 168 hours. No results for input(s): AMMONIA in the last 168 hours.  Coagulation Profile: No results for input(s): INR, PROTIME in the last 168 hours.  Cardiac Enzymes: No results for input(s): CKTOTAL, CKMB, CKMBINDEX, TROPONINI in the last 168 hours.  BNP (last 3 results) No results for input(s): PROBNP in the last 8760 hours.  Lipid Profile: No results for input(s): CHOL, HDL, LDLCALC, TRIG, CHOLHDL, LDLDIRECT in the last 72 hours.  Thyroid Function Tests: No results for input(s): TSH, T4TOTAL, FREET4, T3FREE, THYROIDAB in the last 72 hours.  Anemia Panel: No results for input(s): VITAMINB12, FOLATE, FERRITIN, TIBC, IRON, RETICCTPCT in the last 72 hours.  Urine analysis:    Component Value Date/Time   COLORURINE YELLOW 04/15/2020 1455   APPEARANCEUR HAZY (A) 04/15/2020 1455   LABSPEC 1.006 04/15/2020 1455   PHURINE 5.0 04/15/2020 1455   GLUCOSEU NEGATIVE 04/15/2020 1455   HGBUR SMALL (A) 04/15/2020 1455   BILIRUBINUR NEGATIVE 04/15/2020 1455   KETONESUR NEGATIVE 04/15/2020 1455   PROTEINUR NEGATIVE 04/15/2020 1455   NITRITE NEGATIVE 04/15/2020 1455   LEUKOCYTESUR LARGE (A) 04/15/2020 1455    Sepsis Labs: Lactic Acid, Venous    Component Value Date/Time   LATICACIDVEN 0.9 03/26/2020 0958    MICROBIOLOGY: Recent Results (from the past 240 hour(s))  CSF culture w Stat Gram Stain     Status: None   Collection Time: 04/10/20  5:18 PM   Specimen: CSF; Cerebrospinal Fluid  Result Value Ref Range Status   Specimen Description CSF  Final   Special Requests NONE  Final   Gram Stain   Final    WBC PRESENT, PREDOMINANTLY MONONUCLEAR CORRECTED RESULTS NO ORGANISMS SEEN PREVIOUSLY REPORTED AS: CRYPTOCOCCUS NEOFORMANS CYTOSPIN SMEAR CRITICAL RESULT CALLED TO, READ BACK BY AND  VERIFIED WITH: RN Ethlyn Daniels 1050 [id number] FCP    Culture   Final    NO GROWTH 3 DAYS Performed at [facility], [place] [address], [place], [place] [address]    Report Status 04/14/2020 FINAL  Final  Culture, fungus without smear     Status: None (Preliminary result)   Collection Time: 04/10/20  5:18 PM   Specimen: CSF; Other  Result Value Ref Range Status   Specimen Description CSF  Final   Special Requests NONE  Final   Culture   Final    NO FUNGUS ISOLATED AFTER 7 DAYS Performed at [facility], [address], [place], [place] [address]    Report Status PENDING  Incomplete    RADIOLOGY STUDIES/RESULTS: No results found.   LOS: 23 days   Lala Lund, MD  [facility]   04/18/2020, 11:12 AM

## 2020-04-18 NOTE — TOC Progression Note (Signed)
Transition of Care [facility]) - Progression Note    Patient Details  Name: Ricky [name] MRN: [id number] Date of Birth: 11/07/1955  Transition of Care [facility]) CM/SW Contact  Pollie Friar, RN Phone Number: 04/18/2020, 3:38 PM  Clinical Narrative:    Pt without a PCP. CM was able to obtain him an appointment at [facility] with information on the AVS. TOC following.   Expected Discharge Plan: Home/Self Care Barriers to Discharge: Continued Medical Work up  Expected Discharge Plan and Services Expected Discharge Plan: Home/Self Care   Discharge Planning Services: CM Consult   Living arrangements for the past 2 months: Apartment                                       Social Determinants of Health (SDOH) Interventions    Readmission Risk Interventions No flowsheet data found.

## 2020-04-18 NOTE — Progress Notes (Signed)
[facility] for Infectious Disease  Date of Admission:  03/26/2020           Reason for visit: Follow up on cryptococcal meningitis, pulmonary MAC, and HIV/AIDS   Principal Problem:   Cryptococcal meningitis ([place]) Active Problems:   Leukocytopenia   Macrocytosis   Moderate protein malnutrition (HCC)   Hypoalbuminemia   AIDS (acquired immune deficiency syndrome) (HCC)   Atrial flutter (HCC)   Aortic atherosclerosis (HCC)   Hypertension   SVT (supraventricular tachycardia) (HCC)   Abnormal EKG   Acute nonintractable headache   Smoking   Cocaine use   Cavitary lesion of lung   AKI (acute kidney injury) ([place])   Blurry vision, left eye   Mycobacterium avium infection ([place])   ASSESSMENT & PLAN:    #Cryptooccal meningitis S/P induction phase with amphotericin/flucytosine 03/26/20-04/11/20; fluconazole 864m daily 04/12/20-04/15/20.  Most recent LP done 04/10/20 no growth and opening pressure of 18cm H2O.  Transitioned 04/15/20 to consolidation phase of treatment with fluconazole 4047mdaily. Continue consolidation phase treatment fluconazole 40058maily.  He will be on this for at least 8 weeks (through 06/10/20) before beginning maintenance therapy   #Pulmonary MAC Started on 3-drug regimen with azithromycin, ethambutol, and rifabutin by Dr Vu Gale Journey 04/10/20 given patients immunosuppression and burden of disease.  As noted in prior notes, patient reports heavy coughing prior to admission. Continue current MAC therapy Azithromycin 500m83mily Rifabutin 300mg73mly Ethambutol 1200mg 73my He will continue this regimen until outpatient follow up and repeat sputum cultures (to be done outpatient)   #HIV/AIDS Previously [place] for 2-4 years prior to this admission with CD4 count <35 (2%) and viral load 35,500 copies.  ART being held in setting of cryptococcal meningitis.  PCP prophylaxis complicated by drug-drug interactions, hyperkalemia/elevated  creatinine, and low G6PD level. Continue to hold ART  Stop Bactrim SS daily given rise in K and Cr.  Will initiate atovaquone recognizing potential DDI with rifabutin given no other good options [facility] follow up scheduled with Dr Vu wheGale Journey further discussion can be held regarding getting started on ART  SUBJECTIVE:   24 hour events:  No acute events. Restarted Bactrim SS daily and had K increase and Cr increase.  Patient reports ready to leave and has no energy.  No fevers, chills, cough, SOB today.    OBJECTIVE:   Blood pressure 112/78, pulse 82, temperature 98.3 F (36.8 C), temperature source Oral, resp. rate 18, height _0  (1.854 m), weight 80.8 kg, SpO2 96 %. Body mass index is 23.5 kg/m.  Physical Exam Constitutional:      Comments: Lying in bed, NAD.  HENT:     Head: Normocephalic and atraumatic.  Pulmonary:     Effort: Pulmonary effort is normal. No respiratory distress.  Abdominal:     General: Abdomen is flat. There is no distension.     Palpations: Abdomen is soft.  Neurological:     General: No focal deficit present.     Mental Status: Mental status is at baseline.     Cranial Nerves: No cranial nerve deficit.  Psychiatric:        Mood and Affect: Mood normal.        Behavior: Behavior normal.      Lab Results: Lab Results  Component Value Date   WBC 2.8 (L) 04/13/2020   HGB 12.9 (L) 04/15/2020   HCT 34.6 (L) 04/13/2020   MCV 96.6 04/13/2020   PLT  282 04/13/2020    Lab Results  Component Value Date   NA 132 (L) 04/18/2020   K 5.4 (H) 04/18/2020   CO2 22 04/18/2020   GLUCOSE 97 04/18/2020   BUN 21 04/18/2020   CREATININE 1.40 (H) 04/18/2020   CALCIUM 9.6 04/18/2020   GFRNONAA 57 (L) 04/18/2020    Lab Results  Component Value Date   ALT 26 04/17/2020   AST 30 04/17/2020   ALKPHOS 162 (H) 04/17/2020   BILITOT 0.9 04/17/2020    No results found for: CRP     Component Value Date/Time   ESRSEDRATE 30 (H) 03/26/2020 8110     I have  reviewed the micro and lab results in Epic.  Imaging: No results found.   Imaging  independently reviewed in Epic.    Raynelle [facility] for Infectious Disease [facility] [phone] pager 04/18/2020, 11:22 AM

## 2020-04-19 ENCOUNTER — Inpatient Hospital Stay (HOSPITAL_COMMUNITY): Payer: Medicaid Other

## 2020-04-19 LAB — GLUCOSE, CAPILLARY: Glucose-Capillary: 138 mg/dL — ABNORMAL HIGH (ref 70–99)

## 2020-04-19 LAB — BASIC METABOLIC PANEL
Anion gap: 8 (ref 5–15)
BUN: 30 mg/dL — ABNORMAL HIGH (ref 8–23)
CO2: 22 mmol/L (ref 22–32)
Calcium: 9 mg/dL (ref 8.9–10.3)
Chloride: 100 mmol/L (ref 98–111)
Creatinine, Ser: 1.92 mg/dL — ABNORMAL HIGH (ref 0.61–1.24)
GFR, Estimated: 39 mL/min — ABNORMAL LOW (ref >60)
Glucose, Bld: 132 mg/dL — ABNORMAL HIGH (ref 70–99)
Potassium: 5.3 mmol/L — ABNORMAL HIGH (ref 3.5–5.1)
Sodium: 130 mmol/L — ABNORMAL LOW (ref 135–145)

## 2020-04-19 LAB — MAGNESIUM: Magnesium: 1.8 mg/dL (ref 1.7–2.4)

## 2020-04-19 LAB — BRAIN NATRIURETIC PEPTIDE: B Natriuretic Peptide: 45.9 pg/mL (ref 0.0–100.0)

## 2020-04-19 IMAGING — DX DG CHEST 1V PORT
1 series · 1 of 1 positions shown · non-contrast
Comparison: [DATE] chest radiograph and [DATE] chest CT

CLINICAL DATA: Shortness of breath

EXAM:
PORTABLE CHEST 1 VIEW

[chest]
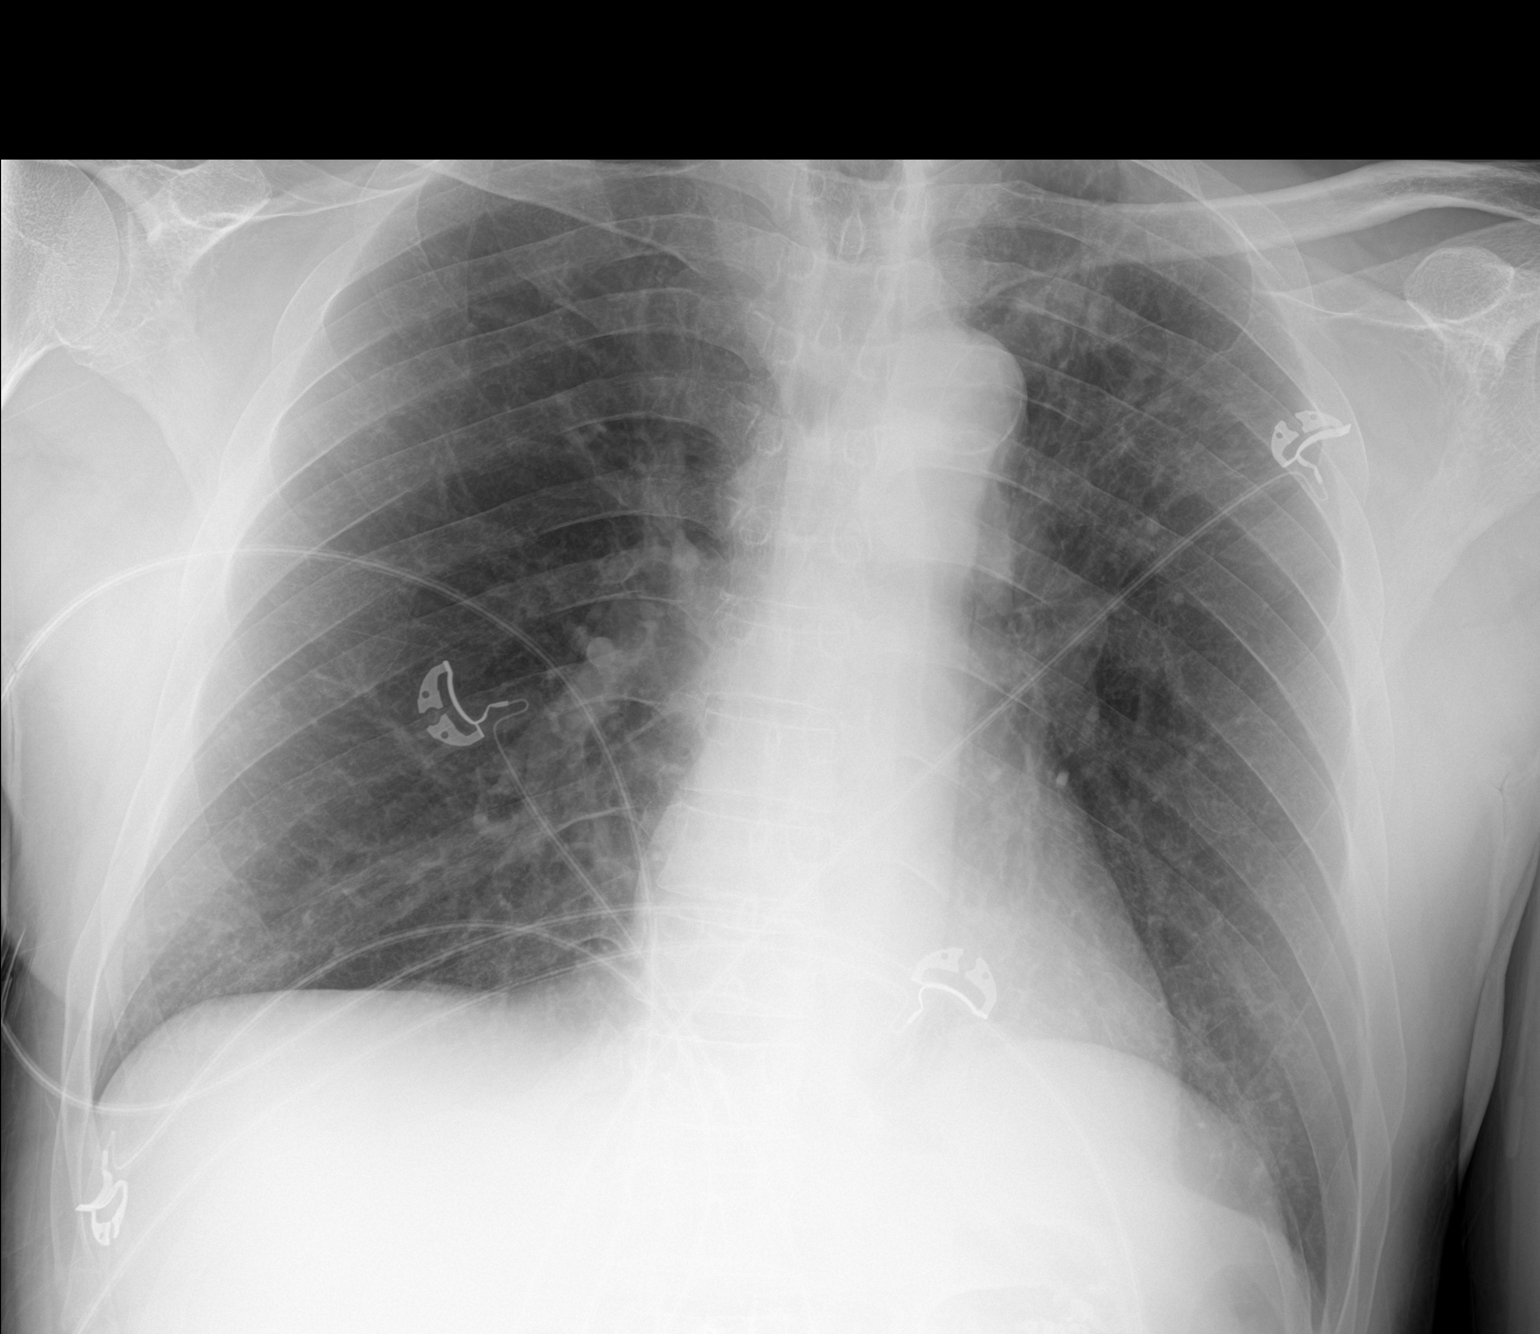

[1 of 1 positions shown; findings below may reference images not displayed]

FINDINGS: There is scarring in the left apical region. There is no edema or
airspace opacity. The heart size and pulmonary vascularity are
normal. No adenopathy. There is aortic atherosclerosis. No bone
lesions.
IMPRESSION: Scarring left apex region. No edema or airspace opacity. Heart size
within normal limits. Aortic Atherosclerosis ([88]-[88]).

## 2020-04-19 MED ORDER — SODIUM ZIRCONIUM CYCLOSILICATE 10 G PO PACK
10.0000 g | PACK | Freq: Three times a day (TID) | ORAL | Status: AC
  Administered 2020-04-19 (×3): 10 g via ORAL
  Filled 2020-04-19 (×3): qty 1

## 2020-04-19 MED ORDER — METOPROLOL TARTRATE 5 MG/5ML IV SOLN
2.5000 mg | Freq: Once | INTRAVENOUS | Status: AC
  Administered 2020-04-19: 2.5 mg via INTRAVENOUS
  Filled 2020-04-19: qty 5

## 2020-04-19 MED ORDER — TRAMADOL HCL 50 MG PO TABS
50.0000 mg | ORAL_TABLET | Freq: Two times a day (BID) | ORAL | Status: DC | PRN
Start: 2020-04-19 — End: 2020-04-25
  Administered 2020-04-19 – 2020-04-24 (×7): 50 mg via ORAL
  Filled 2020-04-19 (×8): qty 1

## 2020-04-19 MED ORDER — ACETAMINOPHEN 325 MG PO TABS
650.0000 mg | ORAL_TABLET | Freq: Four times a day (QID) | ORAL | Status: DC | PRN
  Administered 2020-04-20 – 2020-04-22 (×5): 650 mg via ORAL
  Filled 2020-04-19 (×5): qty 2

## 2020-04-19 MED ORDER — SODIUM CHLORIDE 0.9 % IV BOLUS
500.0000 mL | Freq: Once | INTRAVENOUS | Status: AC
  Administered 2020-04-19: 500 mL via INTRAVENOUS

## 2020-04-19 MED ORDER — AMIODARONE IV BOLUS ONLY 150 MG/100ML
150.0000 mg | Freq: Once | INTRAVENOUS | Status: DC
  Filled 2020-04-19: qty 100

## 2020-04-19 MED ORDER — LACTATED RINGERS IV SOLN: INTRAVENOUS | Status: AC

## 2020-04-19 NOTE — Progress Notes (Signed)
PROGRESS NOTE        PATIENT DETAILS Name: Ricky [name] Age: 65 y.o. Sex: male Date of Birth: Jul 23, 1955 Admit Date: 03/26/2020 Admitting Physician No admitting provider for patient encounter. PCP:Pcp, No  Brief Narrative: Patient is a 65 y.o. 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 2/22>> CT chest with contrast: Nodular/cavitary airspace consolidation in the left upper/lower lobes 2/22>> Echo: EF 50-55% 2/24>> CT head: No acute intracranial process 2/24>> nuclear stress test: No ischemia  Antimicrobial therapy: Amphotericin/flucytosine: 2/21>>3/9 Ethambutol: 3/8>> Rifabutin: 3/8>> Zithromax: 3/8>> Fluconazole: 3/9>>  Microbiology data: 2/21>> CSF culture: Cryptococcus neoformans 2/23>> sputum AFB smear negative 2/23>> sputum AFB DNA probe: Positive for M avium complex 2/24>> sputum AFB smear: Negative 2/24>> CSF culture: Cryptococcus neoformans 2/28>> CSF culture: No growth 3/8>> CSF Gram stain: Cryptococcus neoformans 3/8>> CSF culture: No growth  Procedures : LP>> 2/22, 2/24, 2/28, 3/8  Consults: Neurology, cardiology Terri Skains), ID, ophthalmology  DVT Prophylaxis : SCDs Start: 03/26/20 1946   Subjective: Patient in bed, appears comfortable, mild supraorbital head pressure which has been chronic and gradually improving, no fever, no chest pain or pressure, no shortness of breath , no abdominal pain. No focal weakness.  Assessment/Plan:  Cryptococcal meningitis: Overall improved-completed a course of IV amphotericin/flucytosine-has been transitioned to  fluconazole.  Await final culture results of LP from 3/8.  Appreciate ID follow-up currently on fluconazole with end date 06/10/2020.  Outpatient follow-up with Dr. Gale Journey ID.   HIV/AIDS: Noncompliant-timing for initiation of ART defer to ID.  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  Currently on triple therapy.  Will follow with ID in 2 to 3 weeks post discharge for follow-up post DC.   Azithromycin 565m daily  Rifabutin 3057mdaily  Ethambutol 120069maily  History of tuberculosis-apparently treated at the [facility] April 2001  AKI - on CKD stage IIIb baseline creatinine around 1.5, AKI likely due to hypotension night of 04/18/2020 and recent Bactrim exposure.  Hold Bactrim, hydrate and monitor.  SVT: Some sinus pauses, discussed with Dr. GanEinar Giprdiologist on 04/15/2020, had a brief run of SVT night of 04/18/2020, stable on low-dose Cardizem which has been added continue to monitor on Cardizem..  Marland Kitchenyponatremia.  Due to SIADH.  Improved after 2 doses of Samsca.    Chronic right eye blurriness: Evaluated by ophthalmology on 3/7-no evidence of infection.  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  History of cocaine use & Tobacco abuse - counseled to quit.  Recent COVID-19 infection: Asymptomatic-out of quarantine window.  Hyperkalemia.  Due to Bactrim.  Repeat Lokelma on 04/19/2020, Bactrim has been discontinued.   Lab Results  Component Value Date   SARSCOV2NAA POSITIVE (A) 03/03/2020   Diet: Diet Order            Diet Heart Room service appropriate? Yes; Fluid consistency: Thin  Diet effective now                  Code Status: Full code  Family Communication: Patient will update  his family himself.  Disposition Plan: Status is: Inpatient  Remains inpatient appropriate because:Inpatient level  of care appropriate due to severity of illness   Dispo:  Patient From:    Planned Disposition:    Medically stable for discharge:       Barriers to Discharge: Cryptococcal meningitis-awaiting CSF cultures to be final before consideration of discharge-Per discussion with ID on 3/10-potential discharge on 04/18/2020 if sodium levels are better.  Antimicrobial agents: Anti-infectives (From admission, onward)   Start     Dose/Rate Route Frequency Ordered Stop   04/19/20 1000  atovaquone (MEPRON) 750 MG/5ML suspension 1,500 mg        1,500 mg Oral Daily 04/18/20 0941     04/18/20 0000  rifabutin (MYCOBUTIN) 150 MG capsule        300 mg Oral Daily 04/18/20 0935     04/18/20 0000  atovaquone (MEPRON) 750 MG/5ML suspension        1,500 mg Oral Daily 04/18/20 0935     04/17/20 1015  sulfamethoxazole-trimethoprim (BACTRIM) 400-80 MG per tablet 1 tablet  Status:  Discontinued        1 tablet Oral Daily 04/17/20 0923 04/18/20 0941   04/17/20 0000  fluconazole (DIFLUCAN) 200 MG tablet        400 mg Oral Daily 04/17/20 1140 06/10/20 2359   04/17/20 0000  rifabutin (MYCOBUTIN) 150 MG capsule  Status:  Discontinued        300 mg Oral Daily 04/17/20 1140 04/18/20    04/17/20 0000  azithromycin (ZITHROMAX) 500 MG tablet        500 mg Oral Daily 04/17/20 1140     04/17/20 0000  ethambutol (MYAMBUTOL) 400 MG tablet        1,200 mg Oral Daily 04/17/20 1140     04/17/20 0000  sulfamethoxazole-trimethoprim (BACTRIM) 400-80 MG tablet  Status:  Discontinued        1 tablet Oral Daily 04/17/20 1140 04/18/20    04/16/20 1000  fluconazole (DIFLUCAN) tablet 400 mg        400 mg Oral Daily 04/15/20 1741     04/15/20 1015  dapsone tablet 100 mg  Status:  Discontinued        100 mg Oral Daily 04/15/20 0925 04/17/20 0923   04/11/20 1815  sulfamethoxazole-trimethoprim (BACTRIM DS) 800-160 MG per tablet 1 tablet  Status:  Discontinued        1 tablet Oral Once per day on Mon Wed Fri 04/11/20 1722 04/15/20 0925    04/11/20 1200  fluconazole (DIFLUCAN) tablet 800 mg  Status:  Discontinued        800 mg Oral Daily 04/11/20 1052 04/15/20 1741   04/11/20 1000  azithromycin (ZITHROMAX) tablet 500 mg        500 mg Oral Daily 04/10/20 1038     04/10/20 1130  ethambutol (MYAMBUTOL) tablet 1,200 mg        1,200 mg Oral Daily 04/10/20 1038     04/10/20 1130  rifabutin (MYCOBUTIN) capsule 300 mg        300 mg Oral Daily 04/10/20 1038     03/28/20 1000  azithromycin (ZITHROMAX) tablet 1,200 mg  Status:  Discontinued        1,200 mg Oral Weekly 03/28/20 0912 04/10/20 1038   03/28/20 1000  sulfamethoxazole-trimethoprim (BACTRIM) 400-80 MG per tablet 1 tablet  Status:  Discontinued        1 tablet Oral Daily 03/28/20 0912 04/02/20 0919   03/27/20 2115  amphotericin  B liposome (AMBISOME) 300 mg in dextrose 5 % 500 mL IVPB  Status:  Discontinued        300 mg 287.5 mL/hr over 120 Minutes Intravenous Every 24 hours 03/26/20 2024 03/26/20 2227   03/26/20 2227  amphotericin B liposome (AMBISOME) 300 mg in dextrose 5 % 500 mL IVPB  Status:  Discontinued        300 mg 287.5 mL/hr over 120 Minutes Intravenous Every 24 hours 03/26/20 2227 04/11/20 1052   03/26/20 1945  amphotericin B liposome (AMBISOME) 300 mg in dextrose 5 % 500 mL IVPB  Status:  Discontinued        300 mg 287.5 mL/hr over 120 Minutes Intravenous Every 24 hours 03/26/20 1850 03/26/20 2024   03/26/20 1900  flucytosine (ANCOBON) capsule 2,000 mg  Status:  Discontinued        25 mg/kg  80 kg Oral Every 6 hours 03/26/20 1850 04/11/20 1052       Time spent: 15- minutes-Greater than 50% of this time was spent in counseling, explanation of diagnosis, planning of further management, and coordination of care.  MEDICATIONS: Scheduled Meds: . atovaquone  1,500 mg Oral Daily  . azithromycin  500 mg Oral Daily  . clobetasol cream   Topical BID  . dextromethorphan  30 mg Oral BID  . diltiazem  30 mg Oral Q12H  . ethambutol  1,200 mg Oral Daily  .  fluconazole  400 mg Oral Daily  . pravastatin  20 mg Oral QPM  . rifabutin  300 mg Oral Daily  . sodium zirconium cyclosilicate  10 g Oral TID   Continuous Infusions: . amiodarone    . lactated ringers Stopped (04/18/20 0743)  . lactated ringers     PRN Meds:.acetaminophen, acetaminophen **OR** [DISCONTINUED] acetaminophen, [DISCONTINUED] ondansetron **OR** ondansetron (ZOFRAN) IV, oxyCODONE   PHYSICAL EXAM: Vital signs: Vitals:   04/19/20 0320 04/19/20 0324 04/19/20 0420 04/19/20 0848  BP: (!) 130/100 104/77 101/66 103/75  Pulse: 83 (!) 147 (!) 130 73  Resp: 17  18   Temp: (!) 97.4 F (36.3 C) 97.8 F (36.6 C) 97.7 F (36.5 C)   TempSrc: Oral Oral Oral   SpO2: 97%  100%   Weight:      Height:       Filed Weights   03/26/20 1839 03/27/20 0141  Weight: 80 kg 80.8 kg   Body mass index is 23.5 kg/m.   Gen Exam:  Awake Alert, No new F.N deficits, Normal affect [place].AT,PERRAL Supple Neck,No JVD, No cervical lymphadenopathy appriciated.  Symmetrical Chest wall movement, Good air movement bilaterally, CTAB RRR,No Gallops, Rubs or new Murmurs, No Parasternal Heave +ve B.Sounds, Abd Soft, No tenderness, No organomegaly appriciated, No rebound - guarding or rigidity. No Cyanosis, Clubbing or edema, No new Rash or bruise    I have personally reviewed following labs and imaging studies  LABORATORY DATA: CBC: Recent Labs  Lab 04/13/20 0411 04/15/20 0405  WBC 2.8*  --   HGB 12.0* 12.9*  HCT 34.6*  --   MCV 96.6  --   PLT 282  --     Basic Metabolic Panel: Recent Labs  Lab 04/15/20 0405 04/16/20 0227 04/17/20 0143 04/17/20 1422 04/18/20 0006 04/18/20 0343 04/19/20 0439  NA 128* 128* 128* 132* 131* 132* 130*  K 5.4* 4.7 4.4  --   --  5.4* 5.3*  CL 100 99 97*  --   --  102 100  CO2 _0 --   --  22 22  GLUCOSE 102* 126* 134*  --   --  97 132*  BUN 21 26* 27*  --   --  21 30*  CREATININE 1.39* 1.63* 1.29*  --   --  1.40* 1.92*  CALCIUM 9.4 9.3 9.2  --    --  9.6 9.0  MG 1.8 1.8 1.6*  --   --  1.9 1.8    GFR: Estimated Creatinine Clearance: 45.1 mL/min (A) (by C-G formula based on SCr of 1.92 mg/dL (H)).  Liver Function Tests: Recent Labs  Lab 04/17/20 0750  AST 30  ALT 26  ALKPHOS 162*  BILITOT 0.9  PROT 8.0  ALBUMIN 3.1*   No results for input(s): LIPASE, AMYLASE in the last 168 hours. No results for input(s): AMMONIA in the last 168 hours.  Coagulation Profile: No results for input(s): INR, PROTIME in the last 168 hours.  Cardiac Enzymes: No results for input(s): CKTOTAL, CKMB, CKMBINDEX, TROPONINI in the last 168 hours.  BNP (last 3 results) No results for input(s): PROBNP in the last 8760 hours.  Lipid Profile: No results for input(s): CHOL, HDL, LDLCALC, TRIG, CHOLHDL, LDLDIRECT in the last 72 hours.  Thyroid Function Tests: No results for input(s): TSH, T4TOTAL, FREET4, T3FREE, THYROIDAB in the last 72 hours.  Anemia Panel: No results for input(s): VITAMINB12, FOLATE, FERRITIN, TIBC, IRON, RETICCTPCT in the last 72 hours.  Urine analysis:    Component Value Date/Time   COLORURINE YELLOW 04/15/2020 1455   APPEARANCEUR HAZY (A) 04/15/2020 1455   LABSPEC 1.006 04/15/2020 1455   PHURINE 5.0 04/15/2020 1455   GLUCOSEU NEGATIVE 04/15/2020 1455   HGBUR SMALL (A) 04/15/2020 1455   BILIRUBINUR NEGATIVE 04/15/2020 1455   KETONESUR NEGATIVE 04/15/2020 1455   PROTEINUR NEGATIVE 04/15/2020 1455   NITRITE NEGATIVE 04/15/2020 1455   LEUKOCYTESUR LARGE (A) 04/15/2020 1455    Sepsis Labs: Lactic Acid, Venous    Component Value Date/Time   LATICACIDVEN 0.9 03/26/2020 0958    MICROBIOLOGY: Recent Results (from the past 240 hour(s))  CSF culture w Stat Gram Stain     Status: None   Collection Time: 04/10/20  5:18 PM   Specimen: CSF; Cerebrospinal Fluid  Result Value Ref Range Status   Specimen Description CSF  Final   Special Requests NONE  Final   Gram Stain   Final    WBC PRESENT, PREDOMINANTLY  MONONUCLEAR CORRECTED RESULTS NO ORGANISMS SEEN PREVIOUSLY REPORTED AS: CRYPTOCOCCUS NEOFORMANS CYTOSPIN SMEAR CRITICAL RESULT CALLED TO, READ BACK BY AND VERIFIED WITH: RN Ethlyn Daniels 1050 [id number] FCP    Culture   Final    NO GROWTH 3 DAYS Performed at [facility], [place] [address], [place], [place] [address]    Report Status 04/14/2020 FINAL  Final  Culture, fungus without smear     Status: None (Preliminary result)   Collection Time: 04/10/20  5:18 PM   Specimen: CSF; Other  Result Value Ref Range Status   Specimen Description CSF  Final   Special Requests NONE  Final   Culture   Final    NO FUNGUS ISOLATED AFTER 8 DAYS Performed at [facility], [address], [place], [place] [address]    Report Status PENDING  Incomplete    RADIOLOGY STUDIES/RESULTS: No results found.   LOS: 24 days   Lala Lund, MD  [facility]   04/19/2020, 8:52 AM

## 2020-04-19 NOTE — Progress Notes (Signed)
Cardizem did decrease SBP to 90s. Bolus given BP improved to the 100s. HR continued 170-180. With the soft BP - small dose of metoprolol - 2.5mg  IV - given, and HR improved to High 130s. Another bolus and 2.5mg  IV metoprolol given - and HR improved to low 130s. Patient resting comfortably at low 130s. Will continue to monitor. PO cardizem will be continued 30mg  BID.

## 2020-04-19 NOTE — Progress Notes (Signed)
EKG obtained, but system issue w/ transmitting. Dr. Helyn App updated about EKG reading.

## 2020-04-19 NOTE — Plan of Care (Signed)

## 2020-04-20 LAB — BASIC METABOLIC PANEL
Anion gap: 6 (ref 5–15)
BUN: 22 mg/dL (ref 8–23)
CO2: 24 mmol/L (ref 22–32)
Calcium: 8.8 mg/dL — ABNORMAL LOW (ref 8.9–10.3)
Chloride: 98 mmol/L (ref 98–111)
Creatinine, Ser: 1.31 mg/dL — ABNORMAL HIGH (ref 0.61–1.24)
GFR, Estimated: >60 mL/min (ref >60)
Glucose, Bld: 126 mg/dL — ABNORMAL HIGH (ref 70–99)
Potassium: 4 mmol/L (ref 3.5–5.1)
Sodium: 128 mmol/L — ABNORMAL LOW (ref 135–145)

## 2020-04-20 LAB — CBC WITH DIFFERENTIAL/PLATELET
Abs Immature Granulocytes: 0.06 10*3/uL (ref 0.00–0.07)
Basophils Absolute: 0 10*3/uL (ref 0.0–0.1)
Basophils Relative: 0 %
Eosinophils Absolute: 0.2 10*3/uL (ref 0.0–0.5)
Eosinophils Relative: 4 %
HCT: 34.3 % — ABNORMAL LOW (ref 39.0–52.0)
Hemoglobin: 11.5 g/dL — ABNORMAL LOW (ref 13.0–17.0)
Immature Granulocytes: 1 %
Lymphocytes Relative: 16 %
Lymphs Abs: 0.8 10*3/uL (ref 0.7–4.0)
MCH: 32.4 pg (ref 26.0–34.0)
MCHC: 33.5 g/dL (ref 30.0–36.0)
MCV: 96.6 fL (ref 80.0–100.0)
Monocytes Absolute: 0.7 10*3/uL (ref 0.1–1.0)
Monocytes Relative: 15 %
Neutro Abs: 3.2 10*3/uL (ref 1.7–7.7)
Neutrophils Relative %: 64 %
Platelets: 192 10*3/uL (ref 150–400)
RBC: 3.55 MIL/uL — ABNORMAL LOW (ref 4.22–5.81)
RDW: 11.4 % — ABNORMAL LOW (ref 11.5–15.5)
WBC: 5.1 10*3/uL (ref 4.0–10.5)
nRBC: 0 % (ref 0.0–0.2)

## 2020-04-20 LAB — MAGNESIUM: Magnesium: 1.6 mg/dL — ABNORMAL LOW (ref 1.7–2.4)

## 2020-04-20 LAB — BRAIN NATRIURETIC PEPTIDE: B Natriuretic Peptide: 54.3 pg/mL (ref 0.0–100.0)

## 2020-04-20 MED ORDER — DILTIAZEM HCL 25 MG/5ML IV SOLN
5.0000 mg | Freq: Once | INTRAVENOUS | Status: AC
  Administered 2020-04-20: 5 mg via INTRAVENOUS
  Filled 2020-04-20: qty 5

## 2020-04-20 MED ORDER — DILTIAZEM HCL 25 MG/5ML IV SOLN
10.0000 mg | Freq: Four times a day (QID) | INTRAVENOUS | Status: DC | PRN
  Administered 2020-04-20 (×2): 10 mg via INTRAVENOUS
  Filled 2020-04-20 (×4): qty 5

## 2020-04-20 MED ORDER — METOPROLOL TARTRATE 25 MG PO TABS
25.0000 mg | ORAL_TABLET | Freq: Two times a day (BID) | ORAL | Status: DC
  Administered 2020-04-20 – 2020-04-22 (×5): 25 mg via ORAL
  Filled 2020-04-20 (×6): qty 1

## 2020-04-20 MED ORDER — LACTATED RINGERS IV BOLUS
500.0000 mL | Freq: Once | INTRAVENOUS | Status: AC
  Administered 2020-04-20: 500 mL via INTRAVENOUS

## 2020-04-20 MED ORDER — MAGNESIUM SULFATE 2 GM/50ML IV SOLN
2.0000 g | Freq: Once | INTRAVENOUS | Status: AC
  Administered 2020-04-20: 2 g via INTRAVENOUS
  Filled 2020-04-20: qty 50

## 2020-04-20 MED ORDER — TOLVAPTAN 15 MG PO TABS: 15.0000 mg | ORAL_TABLET | Freq: Once | ORAL | Status: DC

## 2020-04-20 MED ORDER — METOPROLOL TARTRATE 5 MG/5ML IV SOLN
5.0000 mg | Freq: Once | INTRAVENOUS | Status: AC
  Administered 2020-04-20: 5 mg via INTRAVENOUS
  Filled 2020-04-20: qty 5

## 2020-04-20 MED ORDER — TOLVAPTAN 15 MG PO TABS
15.0000 mg | ORAL_TABLET | Freq: Once | ORAL | Status: AC
  Administered 2020-04-20: 15 mg via ORAL
  Filled 2020-04-20: qty 1

## 2020-04-20 MED ORDER — SODIUM CHLORIDE 1 G PO TABS
1.0000 g | ORAL_TABLET | Freq: Three times a day (TID) | ORAL | Status: AC
  Administered 2020-04-20 – 2020-04-21 (×6): 1 g via ORAL
  Filled 2020-04-20 (×6): qty 1

## 2020-04-20 MED ORDER — MEGESTROL ACETATE 40 MG PO TABS
40.0000 mg | ORAL_TABLET | Freq: Every day | ORAL | Status: DC
  Administered 2020-04-20 – 2020-04-25 (×6): 40 mg via ORAL
  Filled 2020-04-20 (×6): qty 1

## 2020-04-20 MED ORDER — SODIUM CHLORIDE 0.9 % IV BOLUS
500.0000 mL | Freq: Once | INTRAVENOUS | Status: AC
  Administered 2020-04-20: 500 mL via INTRAVENOUS

## 2020-04-20 NOTE — Progress Notes (Signed)
Patient with recurrent episodes of SVT/atrial tachycardia.  Although he has had "pauses" he is asymptomatic and these are mostly nonconducted PACs and not really high degree AV block.  Continue diltiazem but I will add metoprolol as well 25 mg p.o. twice daily.  Will evaluate and see how he does with the combination, I will also have an EP to opine regarding possibility of ablation.  It appears that the tachycardia is atrial tachycardia and not PSVT but will have their opinion to see if it is ablateable rhythm.   Adrian Prows, MD, [facility] 04/20/2020, 6:05 PM Office: [phone] Pager: [phone]

## 2020-04-20 NOTE — Progress Notes (Signed)
Patient in SVT HR in 150"s. MD notified.

## 2020-04-20 NOTE — TOC Progression Note (Signed)
Transition of Care [facility]) - Progression Note    Patient Details  Name: LATASHA PUSKAS MRN: [id number] Date of Birth: Sep 08, 1955  [facility]) CM/SW Contact  Pollie Friar, RN Phone Number: 04/20/2020, 10:29 AM  Clinical Narrative:    Medications for home will be locked in main pharmacy. Bedside RN will need to obtain these prior to d/c.  TOC following.   Expected Discharge Plan: Home/Self Care Barriers to Discharge: Continued Medical Work up  Expected Discharge Plan and Services Expected Discharge Plan: Home/Self Care   Discharge Planning Services: CM Consult   Living arrangements for the past 2 months: Apartment                                       Social Determinants of Health (SDOH) Interventions    Readmission Risk Interventions No flowsheet data found.

## 2020-04-20 NOTE — Progress Notes (Signed)
PROGRESS NOTE        PATIENT DETAILS Name: Ricky [name] Age: 65 y.o. Sex: male Date of Birth: Mar 23, 1955 Admit Date: 03/26/2020 Admitting Physician No admitting provider for patient encounter. PCP:Pcp, No  Brief Narrative: Patient is a 65 y.o. 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 2/22>> CT chest with contrast: Nodular/cavitary airspace consolidation in the left upper/lower lobes 2/22>> Echo: EF 50-55% 2/24>> CT head: No acute intracranial process 2/24>> nuclear stress test: No ischemia  Antimicrobial therapy: Amphotericin/flucytosine: 2/21>>3/9 Ethambutol: 3/8>> Rifabutin: 3/8>> Zithromax: 3/8>> Fluconazole: 3/9>>  Microbiology data: 2/21>> CSF culture: Cryptococcus neoformans 2/23>> sputum AFB smear negative 2/23>> sputum AFB DNA probe: Positive for M avium complex 2/24>> sputum AFB smear: Negative 2/24>> CSF culture: Cryptococcus neoformans 2/28>> CSF culture: No growth 3/8>> CSF Gram stain: Cryptococcus neoformans 3/8>> CSF culture: No growth  Procedures : LP>> 2/22, 2/24, 2/28, 3/8  Consults: Neurology, cardiology Terri Skains), ID, ophthalmology  DVT Prophylaxis : SCDs Start: 03/26/20 1946   Subjective:  Patient in bed, appears comfortable, mild supraorbital head pressure which is chronic, poor appetite, no fever, no chest pain or pressure, no shortness of breath , no abdominal pain. No focal weakness.   Assessment/Plan:  Cryptococcal meningitis: Overall improved-completed a course of IV amphotericin/flucytosine-has been transitioned to fluconazole.  Await  final culture results of LP from 3/8.  Appreciate ID follow-up currently on fluconazole with end date 06/10/2020.  Outpatient follow-up with Dr. Gale Journey ID.   HIV/AIDS: Noncompliant-timing for initiation of ART defer to ID.  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  Currently on triple therapy.  Will follow with ID in 2 to 3 weeks post discharge for follow-up post DC.   Azithromycin 552m daily  Rifabutin 3011mdaily  Ethambutol 120046maily  History of tuberculosis-app[facility]  AKI - on CKD stage IIIb baseline creatinine around 1.5, AKI likely due to hypotension night of 04/18/2020 and recent Bactrim exposure.  Improved after Bactrim was discontinued and gentle hydration on 04/19/2020.  SVT: Some sinus pauses, discussed with Dr. GanEinar Giprdiologist on 04/15/2020, had a brief run of SVT night of 04/18/2020, stable on low-dose Cardizem which has been added continue to monitor on Cardizem.  Have added as needed IV Cardizem push.  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  Repeat Samsca on 04/20/2020.    Chronic right eye blurriness: Evaluated by ophthalmology on 3/7-no evidence of infection.  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  Also added Megace on 04/20/2020 due to poor appetite.  History of cocaine use & Tobacco abuse - counseled to quit.  Recent COVID-19 infection: Asymptomatic-out of quarantine window.  Hyperkalemia.  Due to Bactrim, resolved after Lokelma and stoppage of Bactrim.   Lab Results  Component Value Date   SARSCOV2NAA POSITIVE (A) 03/03/2020   Diet: Diet Order            Diet Heart Room service appropriate? Yes; Fluid consistency: Thin  Diet effective now  Code Status: Full code  Family Communication: Patient will update his family  himself.  Disposition Plan: Status is: Inpatient  Remains inpatient appropriate because:Inpatient level of care appropriate due to severity of illness   Dispo:  Patient From:    Planned Disposition:    Medically stable for discharge:       Barriers to Discharge: Cryptococcal meningitis-awaiting CSF cultures to be final before consideration of discharge-Per discussion with ID on 3/10-potential discharge on 04/18/2020 if sodium levels are better.  Antimicrobial agents: Anti-infectives (From admission, onward)   Start     Dose/Rate Route Frequency Ordered Stop   04/19/20 1000  atovaquone (MEPRON) 750 MG/5ML suspension 1,500 mg        1,500 mg Oral Daily 04/18/20 0941     04/18/20 0000  rifabutin (MYCOBUTIN) 150 MG capsule        300 mg Oral Daily 04/18/20 0935     04/18/20 0000  atovaquone (MEPRON) 750 MG/5ML suspension        1,500 mg Oral Daily 04/18/20 0935     04/17/20 1015  sulfamethoxazole-trimethoprim (BACTRIM) 400-80 MG per tablet 1 tablet  Status:  Discontinued        1 tablet Oral Daily 04/17/20 0923 04/18/20 0941   04/17/20 0000  fluconazole (DIFLUCAN) 200 MG tablet        400 mg Oral Daily 04/17/20 1140 06/10/20 2359   04/17/20 0000  rifabutin (MYCOBUTIN) 150 MG capsule  Status:  Discontinued        300 mg Oral Daily 04/17/20 1140 04/18/20    04/17/20 0000  azithromycin (ZITHROMAX) 500 MG tablet        500 mg Oral Daily 04/17/20 1140     04/17/20 0000  ethambutol (MYAMBUTOL) 400 MG tablet        1,200 mg Oral Daily 04/17/20 1140     04/17/20 0000  sulfamethoxazole-trimethoprim (BACTRIM) 400-80 MG tablet  Status:  Discontinued        1 tablet Oral Daily 04/17/20 1140 04/18/20    04/16/20 1000  fluconazole (DIFLUCAN) tablet 400 mg        400 mg Oral Daily 04/15/20 1741     04/15/20 1015  dapsone tablet 100 mg  Status:  Discontinued        100 mg Oral Daily 04/15/20 0925 04/17/20 0923   04/11/20 1815  sulfamethoxazole-trimethoprim (BACTRIM DS) 800-160 MG per tablet 1  tablet  Status:  Discontinued        1 tablet Oral Once per day on Mon Wed Fri 04/11/20 1722 04/15/20 0925   04/11/20 1200  fluconazole (DIFLUCAN) tablet 800 mg  Status:  Discontinued        800 mg Oral Daily 04/11/20 1052 04/15/20 1741   04/11/20 1000  azithromycin (ZITHROMAX) tablet 500 mg        500 mg Oral Daily 04/10/20 1038     04/10/20 1130  ethambutol (MYAMBUTOL) tablet 1,200 mg        1,200 mg Oral Daily 04/10/20 1038     04/10/20 1130  rifabutin (MYCOBUTIN) capsule 300 mg        300 mg Oral Daily 04/10/20 1038     03/28/20 1000  azithromycin (ZITHROMAX) tablet 1,200 mg  Status:  Discontinued        1,200 mg Oral Weekly 03/28/20 0912 04/10/20 1038   03/28/20 1000  sulfamethoxazole-trimethoprim (BACTRIM) 400-80 MG per tablet 1 tablet  Status:  Discontinued        1 tablet Oral Daily  03/28/20 0912 04/02/20 0919   03/27/20 2115  amphotericin B liposome (AMBISOME) 300 mg in dextrose 5 % 500 mL IVPB  Status:  Discontinued        300 mg 287.5 mL/hr over 120 Minutes Intravenous Every 24 hours 03/26/20 2024 03/26/20 2227   03/26/20 2227  amphotericin B liposome (AMBISOME) 300 mg in dextrose 5 % 500 mL IVPB  Status:  Discontinued        300 mg 287.5 mL/hr over 120 Minutes Intravenous Every 24 hours 03/26/20 2227 04/11/20 1052   03/26/20 1945  amphotericin B liposome (AMBISOME) 300 mg in dextrose 5 % 500 mL IVPB  Status:  Discontinued        300 mg 287.5 mL/hr over 120 Minutes Intravenous Every 24 hours 03/26/20 1850 03/26/20 2024   03/26/20 1900  flucytosine (ANCOBON) capsule 2,000 mg  Status:  Discontinued        25 mg/kg  80 kg Oral Every 6 hours 03/26/20 1850 04/11/20 1052       Time spent: 15- minutes-Greater than 50% of this time was spent in counseling, explanation of diagnosis, planning of further management, and coordination of care.  MEDICATIONS: Scheduled Meds: . atovaquone  1,500 mg Oral Daily  . azithromycin  500 mg Oral Daily  . clobetasol cream   Topical BID  .  diltiazem  30 mg Oral Q12H  . ethambutol  1,200 mg Oral Daily  . fluconazole  400 mg Oral Daily  . pravastatin  20 mg Oral QPM  . rifabutin  300 mg Oral Daily  . sodium chloride  1 g Oral TID WC   Continuous Infusions: . amiodarone    . magnesium sulfate bolus IVPB     PRN Meds:.acetaminophen, diltiazem, [DISCONTINUED] ondansetron **OR** ondansetron (ZOFRAN) IV, traMADol   PHYSICAL EXAM: Vital signs: Vitals:   04/19/20 2000 04/19/20 2337 04/20/20 0400 04/20/20 0827  BP: 125/84 116/77 112/72 110/82  Pulse: 73 76 73 74  Resp: _0 Temp: 98.6 F (37 C) 98.5 F (36.9 C)  98.7 F (37.1 C)  TempSrc: Oral Oral Oral Oral  SpO2: 98% 97% 97% 97%  Weight:      Height:       Filed Weights   03/26/20 1839 03/27/20 0141  Weight: 80 kg 80.8 kg   Body mass index is 23.5 kg/m.   Gen Exam:  Awake Alert, No new F.N deficits, Normal affect [place].AT,PERRAL Supple Neck,No JVD, No cervical lymphadenopathy appriciated.  Symmetrical Chest wall movement, Good air movement bilaterally, CTAB RRR,No Gallops, Rubs or new Murmurs, No Parasternal Heave +ve B.Sounds, Abd Soft, No tenderness, No organomegaly appriciated, No rebound - guarding or rigidity. No Cyanosis, Clubbing or edema, No new Rash or bruise  I have personally reviewed following labs and imaging studies  LABORATORY DATA: CBC: Recent Labs  Lab 04/15/20 0405 04/20/20 0357  WBC  --  5.1  NEUTROABS  --  3.2  HGB 12.9* 11.5*  HCT  --  34.3*  MCV  --  96.6  PLT  --  093    Basic Metabolic Panel: Recent Labs  Lab 04/16/20 0227 04/17/20 0143 04/17/20 1422 04/18/20 0006 04/18/20 0343 04/19/20 0439 04/20/20 0357  NA 128* 128* 132* 131* 132* 130* 128*  K 4.7 4.4  --   --  5.4* 5.3* 4.0  CL 99 97*  --   --  102 100 98  CO2 22 23  --   --  _1 GLUCOSE 126*  134*  --   --  97 132* 126*  BUN 26* 27*  --   --  21 30* 22  CREATININE 1.63* 1.29*  --   --  1.40* 1.92* 1.31*  CALCIUM 9.3 9.2  --   --  9.6 9.0 8.8*   MG 1.8 1.6*  --   --  1.9 1.8 1.6*    GFR: Estimated Creatinine Clearance: 66.1 mL/min (A) (by C-G formula based on SCr of 1.31 mg/dL (H)).  Liver Function Tests: Recent Labs  Lab 04/17/20 0750  AST 30  ALT 26  ALKPHOS 162*  BILITOT 0.9  PROT 8.0  ALBUMIN 3.1*   No results for input(s): LIPASE, AMYLASE in the last 168 hours. No results for input(s): AMMONIA in the last 168 hours.  Coagulation Profile: No results for input(s): INR, PROTIME in the last 168 hours.  Cardiac Enzymes: No results for input(s): CKTOTAL, CKMB, CKMBINDEX, TROPONINI in the last 168 hours.  BNP (last 3 results) No results for input(s): PROBNP in the last 8760 hours.  Lipid Profile: No results for input(s): CHOL, HDL, LDLCALC, TRIG, CHOLHDL, LDLDIRECT in the last 72 hours.  Thyroid Function Tests: No results for input(s): TSH, T4TOTAL, FREET4, T3FREE, THYROIDAB in the last 72 hours.  Anemia Panel: No results for input(s): VITAMINB12, FOLATE, FERRITIN, TIBC, IRON, RETICCTPCT in the last 72 hours.  Urine analysis:    Component Value Date/Time   COLORURINE YELLOW 04/15/2020 1455   APPEARANCEUR HAZY (A) 04/15/2020 1455   LABSPEC 1.006 04/15/2020 1455   PHURINE 5.0 04/15/2020 1455   GLUCOSEU NEGATIVE 04/15/2020 1455   HGBUR SMALL (A) 04/15/2020 1455   BILIRUBINUR NEGATIVE 04/15/2020 1455   KETONESUR NEGATIVE 04/15/2020 1455   PROTEINUR NEGATIVE 04/15/2020 1455   NITRITE NEGATIVE 04/15/2020 1455   LEUKOCYTESUR LARGE (A) 04/15/2020 1455    Sepsis Labs: Lactic Acid, Venous    Component Value Date/Time   LATICACIDVEN 0.9 03/26/2020 0958    MICROBIOLOGY: Recent Results (from the past 240 hour(s))  CSF culture w Stat Gram Stain     Status: None   Collection Time: 04/10/20  5:18 PM   Specimen: CSF; Cerebrospinal Fluid  Result Value Ref Range Status   Specimen Description CSF  Final   Special Requests NONE  Final   Gram Stain   Final    WBC PRESENT, PREDOMINANTLY MONONUCLEAR CORRECTED  RESULTS NO ORGANISMS SEEN PREVIOUSLY REPORTED AS: CRYPTOCOCCUS NEOFORMANS CYTOSPIN SMEAR CRITICAL RESULT CALLED TO, READ BACK BY AND VERIFIED WITH: RN Ethlyn Daniels 1050 [id number] FCP    Culture   Final    NO GROWTH 3 DAYS Performed at [facility], [place] [address] [place], [place] [address]    Report Status 04/14/2020 FINAL  Final  Culture, fungus without smear     Status: None (Preliminary result)   Collection Time: 04/10/20  5:18 PM   Specimen: CSF; Other  Result Value Ref Range Status   Specimen Description CSF  Final   Special Requests NONE  Final   Culture   Final    NO FUNGUS ISOLATED AFTER 9 DAYS Performed at [facility], [address], [place], [place] [address]    Report Status PENDING  Incomplete    RADIOLOGY STUDIES/RESULTS: DG Chest Port 1 View  Result Date: 04/19/2020 CLINICAL DATA:  Shortness of breath EXAM: PORTABLE CHEST 1 VIEW COMPARISON:  March 21, 2020 chest radiograph and March 27, 2020 chest CT FINDINGS: There is scarring in the left apical region. There is no  edema or airspace opacity. The heart size and pulmonary vascularity are normal. No adenopathy. There is aortic atherosclerosis. No bone lesions. IMPRESSION: Scarring left apex region. No edema or airspace opacity. Heart size within normal limits. Aortic Atherosclerosis (ICD10-I70.0). Electronically Signed   By: Lowella Grip III M.D.   On: 04/19/2020 09:14     LOS: 25 days   Lala Lund, MD  [facility]   04/20/2020, 9:01 AM

## 2020-04-21 DIAGNOSIS — I4430 Unspecified atrioventricular block: Secondary | ICD-10-CM

## 2020-04-21 LAB — BASIC METABOLIC PANEL
Anion gap: 8 (ref 5–15)
BUN: 19 mg/dL (ref 8–23)
CO2: 22 mmol/L (ref 22–32)
Calcium: 8.7 mg/dL — ABNORMAL LOW (ref 8.9–10.3)
Chloride: 100 mmol/L (ref 98–111)
Creatinine, Ser: 1.19 mg/dL (ref 0.61–1.24)
GFR, Estimated: >60 mL/min (ref >60)
Glucose, Bld: 153 mg/dL — ABNORMAL HIGH (ref 70–99)
Potassium: 3.7 mmol/L (ref 3.5–5.1)
Sodium: 130 mmol/L — ABNORMAL LOW (ref 135–145)

## 2020-04-21 LAB — CBC WITH DIFFERENTIAL/PLATELET
Abs Immature Granulocytes: 0.1 10*3/uL — ABNORMAL HIGH (ref 0.00–0.07)
Basophils Absolute: 0 10*3/uL (ref 0.0–0.1)
Basophils Relative: 1 %
Eosinophils Absolute: 0.2 10*3/uL (ref 0.0–0.5)
Eosinophils Relative: 3 %
HCT: 30.8 % — ABNORMAL LOW (ref 39.0–52.0)
Hemoglobin: 10.8 g/dL — ABNORMAL LOW (ref 13.0–17.0)
Immature Granulocytes: 2 %
Lymphocytes Relative: 13 %
Lymphs Abs: 0.7 10*3/uL (ref 0.7–4.0)
MCH: 33.5 pg (ref 26.0–34.0)
MCHC: 35.1 g/dL (ref 30.0–36.0)
MCV: 95.7 fL (ref 80.0–100.0)
Monocytes Absolute: 0.6 10*3/uL (ref 0.1–1.0)
Monocytes Relative: 12 %
Neutro Abs: 3.8 10*3/uL (ref 1.7–7.7)
Neutrophils Relative %: 69 %
Platelets: 211 10*3/uL (ref 150–400)
RBC: 3.22 MIL/uL — ABNORMAL LOW (ref 4.22–5.81)
RDW: 11.4 % — ABNORMAL LOW (ref 11.5–15.5)
WBC: 5.4 10*3/uL (ref 4.0–10.5)
nRBC: 0 % (ref 0.0–0.2)

## 2020-04-21 LAB — BRAIN NATRIURETIC PEPTIDE: B Natriuretic Peptide: 113.7 pg/mL — ABNORMAL HIGH (ref 0.0–100.0)

## 2020-04-21 LAB — MAGNESIUM: Magnesium: 1.9 mg/dL (ref 1.7–2.4)

## 2020-04-21 NOTE — Progress Notes (Addendum)
PROGRESS NOTE        PATIENT DETAILS Name: Ricky [name] Age: 65 y.o. Sex: male Date of Birth: 1955-09-05 Admit Date: 03/26/2020 Admitting Physician No admitting provider for patient encounter. PCP:Pcp, No  Brief Narrative: Patient is a 65 y.o. 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 2/22>> CT chest with contrast: Nodular/cavitary airspace consolidation in the left upper/lower lobes 2/22>> Echo: EF 50-55% 2/24>> CT head: No acute intracranial process 2/24>> nuclear stress test: No ischemia  Antimicrobial therapy: Amphotericin/flucytosine: 2/21>>3/9 Ethambutol: 3/8>> Rifabutin: 3/8>> Zithromax: 3/8>> Fluconazole: 3/9>>  Microbiology data: 2/21>> CSF culture: Cryptococcus neoformans 2/23>> sputum AFB smear negative 2/23>> sputum AFB DNA probe: Positive for M avium complex 2/24>> sputum AFB smear: Negative 2/24>> CSF culture: Cryptococcus neoformans 2/28>> CSF culture: No growth 3/8>> CSF Gram stain: Cryptococcus neoformans 3/8>> CSF culture: No growth  Procedures : LP>> 2/22, 2/24, 2/28, 3/8  Consults: Neurology, cardiology Terri Skains), ID, ophthalmology  DVT Prophylaxis : SCDs Start: 03/26/20 1946   Subjective: Patient in bed appears to be in no distress denies any chest or abdominal pain, no shortness of breath, mild frontal head pressure.   Assessment/Plan:  Cryptococcal meningitis: Overall improved-completed a course of IV amphotericin/flucytosine-has been transitioned to fluconazole.  Await final culture results of LP from 3/8.  Appreciate ID follow-up currently  on fluconazole with end date 06/10/2020.  Outpatient follow-up with Dr. Gale Journey ID.   HIV/AIDS: Noncompliant-timing for initiation of ART defer to ID.  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  Currently on triple therapy.  Will follow with ID in 2 to 3 weeks post discharge for follow-up post DC.   Azithromycin 562m daily  Rifabutin 3075mdaily  Ethambutol 120021maily  History of tuberculosis-apparently treated at the [facility] April 2001  AKI - on CKD stage IIIb baseline creatinine around 1.5, AKI likely due to hypotension night of 04/18/2020 and recent Bactrim exposure.  Improved after Bactrim was discontinued and gentle hydration on 04/19/2020.  SVT: Some sinus pauses, cardiologist Dr. GanEinar Gipase discussed with him, multiple terms of his bradycardia, has been adequately hydrated, currently on Cardizem and beta-blocker combination, cardiology considering ablation treatment this admission.  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  Repeat Samsca on 04/20/2020.    Chronic right eye blurriness: Evaluated by ophthalmology on 3/7-no evidence of infection.  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  Also added Megace on 04/20/2020 due to poor appetite.  History of cocaine use & Tobacco abuse - counseled to quit.  Recent COVID-19 infection: Asymptomatic-out of quarantine window.  Hyperkalemia.  Due to Bactrim, resolved after Lokelma and stoppage of Bactrim.   Lab Results  Component Value Date   SARSCOV2NAA POSITIVE (A) 03/03/2020   Diet: Diet Order            Diet regular Room service appropriate? Yes; Fluid consistency: Thin  Diet effective now                  Code Status: Full code  Family Communication: Patient will update  his family himself.  Disposition Plan: Status is: Inpatient  Remains inpatient  appropriate because:Inpatient level of care appropriate due to severity of illness   Dispo:  Patient From:    Planned Disposition:    Medically stable for discharge:    Barriers to Discharge: Cryptococcal meningitis-awaiting CSF cultures to be final before consideration of discharge-Per discussion with ID on 3/10-potential discharge on 04/18/2020 if sodium levels are better.  Antimicrobial agents: Anti-infectives (From admission, onward)   Start     Dose/Rate Route Frequency Ordered Stop   04/19/20 1000  atovaquone (MEPRON) 750 MG/5ML suspension 1,500 mg        1,500 mg Oral Daily 04/18/20 0941     04/18/20 0000  rifabutin (MYCOBUTIN) 150 MG capsule        300 mg Oral Daily 04/18/20 0935     04/18/20 0000  atovaquone (MEPRON) 750 MG/5ML suspension        1,500 mg Oral Daily 04/18/20 0935     04/17/20 1015  sulfamethoxazole-trimethoprim (BACTRIM) 400-80 MG per tablet 1 tablet  Status:  Discontinued        1 tablet Oral Daily 04/17/20 0923 04/18/20 0941   04/17/20 0000  fluconazole (DIFLUCAN) 200 MG tablet        400 mg Oral Daily 04/17/20 1140 06/10/20 2359   04/17/20 0000  rifabutin (MYCOBUTIN) 150 MG capsule  Status:  Discontinued        300 mg Oral Daily 04/17/20 1140 04/18/20    04/17/20 0000  azithromycin (ZITHROMAX) 500 MG tablet        500 mg Oral Daily 04/17/20 1140     04/17/20 0000  ethambutol (MYAMBUTOL) 400 MG tablet        1,200 mg Oral Daily 04/17/20 1140     04/17/20 0000  sulfamethoxazole-trimethoprim (BACTRIM) 400-80 MG tablet  Status:  Discontinued        1 tablet Oral Daily 04/17/20 1140 04/18/20    04/16/20 1000  fluconazole (DIFLUCAN) tablet 400 mg        400 mg Oral Daily 04/15/20 1741     04/15/20 1015  dapsone tablet 100 mg  Status:  Discontinued        100 mg Oral Daily 04/15/20 0925 04/17/20 0923   04/11/20 1815  sulfamethoxazole-trimethoprim (BACTRIM DS) 800-160 MG per tablet 1 tablet  Status:  Discontinued        1 tablet Oral Once per day on Mon Wed Fri  04/11/20 1722 04/15/20 0925   04/11/20 1200  fluconazole (DIFLUCAN) tablet 800 mg  Status:  Discontinued        800 mg Oral Daily 04/11/20 1052 04/15/20 1741   04/11/20 1000  azithromycin (ZITHROMAX) tablet 500 mg        500 mg Oral Daily 04/10/20 1038     04/10/20 1130  ethambutol (MYAMBUTOL) tablet 1,200 mg        1,200 mg Oral Daily 04/10/20 1038     04/10/20 1130  rifabutin (MYCOBUTIN) capsule 300 mg        300 mg Oral Daily 04/10/20 1038     03/28/20 1000  azithromycin (ZITHROMAX) tablet 1,200 mg  Status:  Discontinued        1,200 mg Oral Weekly 03/28/20 0912 04/10/20 1038   03/28/20 1000  sulfamethoxazole-trimethoprim (BACTRIM) 400-80 MG per tablet 1 tablet  Status:  Discontinued        1 tablet Oral Daily 03/28/20 0912 04/02/20 0919   03/27/20 2115  amphotericin B liposome (AMBISOME)  300 mg in dextrose 5 % 500 mL IVPB  Status:  Discontinued        300 mg 287.5 mL/hr over 120 Minutes Intravenous Every 24 hours 03/26/20 2024 03/26/20 2227   03/26/20 2227  amphotericin B liposome (AMBISOME) 300 mg in dextrose 5 % 500 mL IVPB  Status:  Discontinued        300 mg 287.5 mL/hr over 120 Minutes Intravenous Every 24 hours 03/26/20 2227 04/11/20 1052   03/26/20 1945  amphotericin B liposome (AMBISOME) 300 mg in dextrose 5 % 500 mL IVPB  Status:  Discontinued        300 mg 287.5 mL/hr over 120 Minutes Intravenous Every 24 hours 03/26/20 1850 03/26/20 2024   03/26/20 1900  flucytosine (ANCOBON) capsule 2,000 mg  Status:  Discontinued        25 mg/kg  80 kg Oral Every 6 hours 03/26/20 1850 04/11/20 1052       Time spent: 15- minutes-Greater than 50% of this time was spent in counseling, explanation of diagnosis, planning of further management, and coordination of care.  MEDICATIONS: Scheduled Meds: . atovaquone  1,500 mg Oral Daily  . azithromycin  500 mg Oral Daily  . clobetasol cream   Topical BID  . diltiazem  30 mg Oral Q12H  . ethambutol  1,200 mg Oral Daily  . fluconazole  400  mg Oral Daily  . megestrol  40 mg Oral Daily  . metoprolol tartrate  25 mg Oral BID  . pravastatin  20 mg Oral QPM  . rifabutin  300 mg Oral Daily  . sodium chloride  1 g Oral TID WC   Continuous Infusions: . amiodarone     PRN Meds:.acetaminophen, diltiazem, [DISCONTINUED] ondansetron **OR** ondansetron (ZOFRAN) IV, traMADol   PHYSICAL EXAM: Vital signs: Vitals:   04/21/20 0751 04/21/20 0821 04/21/20 0851 04/21/20 0920  BP: 108/69 100/70 103/74 109/81  Pulse: 67     Resp: _0 Temp: 98.4 F (36.9 C)     TempSrc: Oral     SpO2: 97%     Weight:      Height:       Filed Weights   03/26/20 1839 03/27/20 0141  Weight: 80 kg 80.8 kg   Body mass index is 23.5 kg/m.   Gen Exam:  Awake Alert, No new F.N deficits, Normal affect [place].AT,PERRAL Supple Neck,No JVD, No cervical lymphadenopathy appriciated.  Symmetrical Chest wall movement, Good air movement bilaterally, CTAB RRR,No Gallops, Rubs or new Murmurs, No Parasternal Heave +ve B.Sounds, Abd Soft, No tenderness, No organomegaly appriciated, No rebound - guarding or rigidity. No Cyanosis, Clubbing or edema, No new Rash or bruise  I have personally reviewed following labs and imaging studies  LABORATORY DATA: CBC: Recent Labs  Lab 04/15/20 0405 04/20/20 0357 04/21/20 0130  WBC  --  5.1 5.4  NEUTROABS  --  3.2 3.8  HGB 12.9* 11.5* 10.8*  HCT  --  34.3* 30.8*  MCV  --  96.6 95.7  PLT  --  192 364    Basic Metabolic Panel: Recent Labs  Lab 04/17/20 0143 04/17/20 1422 04/18/20 0006 04/18/20 0343 04/19/20 0439 04/20/20 0357 04/21/20 0130  NA 128*   < > 131* 132* 130* 128* 130*  K 4.4  --   --  5.4* 5.3* 4.0 3.7  CL 97*  --   --  102 100 98 100  CO2 23  --   --  _1 GLUCOSE  134*  --   --  97 132* 126* 153*  BUN 27*  --   --  21 30* 22 19  CREATININE 1.29*  --   --  1.40* 1.92* 1.31* 1.19  CALCIUM 9.2  --   --  9.6 9.0 8.8* 8.7*  MG 1.6*  --   --  1.9 1.8 1.6* 1.9   < > = values in this  interval not displayed.    GFR: Estimated Creatinine Clearance: 72.7 mL/min (by C-G formula based on SCr of 1.19 mg/dL).  Liver Function Tests: Recent Labs  Lab 04/17/20 0750  AST 30  ALT 26  ALKPHOS 162*  BILITOT 0.9  PROT 8.0  ALBUMIN 3.1*   No results for input(s): LIPASE, AMYLASE in the last 168 hours. No results for input(s): AMMONIA in the last 168 hours.  Coagulation Profile: No results for input(s): INR, PROTIME in the last 168 hours.  Cardiac Enzymes: No results for input(s): CKTOTAL, CKMB, CKMBINDEX, TROPONINI in the last 168 hours.  BNP (last 3 results) No results for input(s): PROBNP in the last 8760 hours.  Lipid Profile: No results for input(s): CHOL, HDL, LDLCALC, TRIG, CHOLHDL, LDLDIRECT in the last 72 hours.  Thyroid Function Tests: No results for input(s): TSH, T4TOTAL, FREET4, T3FREE, THYROIDAB in the last 72 hours.  Anemia Panel: No results for input(s): VITAMINB12, FOLATE, FERRITIN, TIBC, IRON, RETICCTPCT in the last 72 hours.  Urine analysis:    Component Value Date/Time   COLORURINE YELLOW 04/15/2020 1455   APPEARANCEUR HAZY (A) 04/15/2020 1455   LABSPEC 1.006 04/15/2020 1455   PHURINE 5.0 04/15/2020 1455   GLUCOSEU NEGATIVE 04/15/2020 1455   HGBUR SMALL (A) 04/15/2020 1455   BILIRUBINUR NEGATIVE 04/15/2020 1455   KETONESUR NEGATIVE 04/15/2020 1455   PROTEINUR NEGATIVE 04/15/2020 1455   NITRITE NEGATIVE 04/15/2020 1455   LEUKOCYTESUR LARGE (A) 04/15/2020 1455    Sepsis Labs: Lactic Acid, Venous    Component Value Date/Time   LATICACIDVEN 0.9 03/26/2020 0958    MICROBIOLOGY: No results found for this or any previous visit (from the past 240 hour(s)).  RADIOLOGY STUDIES/RESULTS: No results found.   LOS: 26 days   Lala Lund, MD  [facility]   04/21/2020, 9:51 AM

## 2020-04-21 NOTE — Progress Notes (Signed)
Progress Note  Patient Name: Ricky [name] Date of Encounter: 04/21/2020  Attending physician: Thurnell Lose, MD Primary care provider: Pcp, No Primary Cardiologist: NA  Subjective: Ricky [name] is a 65 y.o. male was seen examined at bedside at approximately 3 PM. He is accompanied by his brother at bedside. Telemetry reviewed. Patient denies any chest pain, shortness of breath, orthopnea, paroxysmal nocturnal dyspnea or lower extremity swelling. He has intermittent palpitations.  Objective: Vital Signs in the last 24 hours: Temp:  [97.6 F (36.4 C)-98.4 F (36.9 C)] 97.6 F (36.4 C) (03/19 1238) Pulse Rate:  [61-71] 66 (03/19 1238) Resp:  [15-20] 20 (03/19 1238) BP: (100-123)/(69-81) 100/77 (03/19 1238) SpO2:  [97 %-98 %] 98 % (03/19 1238)  Intake/Output:  Intake/Output Summary (Last 24 hours) at 04/21/2020 1642 Last data filed at 04/21/2020 1300 Gross per 24 hour  Intake 592 ml  Output 2550 ml  Net -1958 ml    Net IO Since Admission: 36,782.35 mL [04/21/20 1642]  Weights:  Filed Weights   03/26/20 1839 03/27/20 0141  Weight: 80 kg 80.8 kg    Telemetry: Personally reviewed.  Sinus mechanism with rare brief SVT episodes.  Physical examination: PHYSICAL EXAM: Vitals with BMI 04/21/2020 04/21/2020 04/21/2020  Height - - -  Weight - - -  BMI - - -  Systolic 765 465 035  Diastolic 77 81 74  Pulse 66 - -    CONSTITUTIONAL: Appears older than stated age, hemodynamically stable, no acute distress.   SKIN: Skin is warm and dry. No rash noted. No cyanosis. No pallor. No jaundice HEAD: Normocephalic and atraumatic.  EYES: No scleral icterus MOUTH/THROAT: Dry oral membranes.  NECK: No JVD present. No thyromegaly noted. No carotid bruits  LYMPHATIC: No visible cervical adenopathy.  CHEST Normal respiratory effort. No intercostal retractions  LUNGS: Decreased breath sounds at the bases.  Rhonchi noted left greater than right.  No rales CARDIOVASCULAR: Regular,  positive S1-S2, no murmurs rubs or gallops appreciated ABDOMINAL: No apparent ascites.  EXTREMITIES: No peripheral edema  HEMATOLOGIC: No significant bruising NEUROLOGIC: Oriented to person, place, and time. Nonfocal. Normal muscle tone.  PSYCHIATRIC: Normal mood and affect. Normal behavior. Cooperative   Lab Results: Hematology Recent Labs  Lab 04/15/20 0405 04/20/20 0357 04/21/20 0130  WBC  --  5.1 5.4  RBC  --  3.55* 3.22*  HGB 12.9* 11.5* 10.8*  HCT  --  34.3* 30.8*  MCV  --  96.6 95.7  MCH  --  32.4 33.5  MCHC  --  33.5 35.1  RDW  --  11.4* 11.4*  PLT  --  192 211    Chemistry Recent Labs  Lab 04/17/20 0750 04/17/20 1422 04/19/20 0439 04/20/20 0357 04/21/20 0130  NA  --    < > 130* 128* 130*  K  --    < > 5.3* 4.0 3.7  CL  --    < > 100 98 100  CO2  --    < > 22 24 22   GLUCOSE  --    < > 132* 126* 153*  BUN  --    < > 30* 22 19  CREATININE  --    < > 1.92* 1.31* 1.19  CALCIUM  --    < > 9.0 8.8* 8.7*  PROT 8.0  --   --   --   --   ALBUMIN 3.1*  --   --   --   --   AST 30  --   --   --   --  ALT 26  --   --   --   --   ALKPHOS 162*  --   --   --   --   BILITOT 0.9  --   --   --   --   GFRNONAA  --    < > 39* >60 >60  ANIONGAP  --    < > 8 6 8    < > = values in this interval not displayed.     Cardiac Enzymes: Cardiac Panel (last 3 results) No results for input(s): CKTOTAL, CKMB, TROPONINIHS, RELINDX in the last 72 hours.  BNP (last 3 results) Recent Labs    04/19/20 0439 04/20/20 0357 04/21/20 0130  BNP 45.9 54.3 113.7*    ProBNP (last 3 results) No results for input(s): PROBNP in the last 8760 hours.   DDimer No results for input(s): DDIMER in the last 168 hours.   Hemoglobin A1c: No results found for: HGBA1C, MPG  TSH  Recent Labs    03/27/20 0949  TSH 0.628    Lipid Panel     Component Value Date/Time   CHOL 141 03/29/2020 0255   TRIG 118 03/29/2020 0255   HDL 31 (L) 03/29/2020 0255   CHOLHDL 4.5 03/29/2020 0255   VLDL  24 03/29/2020 0255   LDLCALC 86 03/29/2020 0255    Imaging: No results found.  Cardiac database: EKG: 03/03/2020 00:06: Normal sinus rhythm, 85 bpm, normal axis, borderline repolarization abnormality, without underlying injury pattern.  03/27/2020 at 20:17: Supraventricular tachycardia 139 bpm, LVH per voltage criteria, ST-T changes most likely secondary to repolarization abnormality and rate dependent.  Echocardiogram: 03/28/2020: 1. Left ventricular ejection fraction, by estimation, is 50 to 55%. The  left ventricle has low normal function. The left ventricle has no regional  wall motion abnormalities. There is mild concentric left ventricular  hypertrophy. Left ventricular  diastolic parameters are indeterminate.  2. Right ventricular systolic function is normal. The right ventricular  size is normal.  3. Left atrial size was mildly dilated.  4. Right atrial size was mildly dilated.  5. The mitral valve is normal in structure. Trivial mitral valve  regurgitation. No evidence of mitral stenosis.  6. The aortic valve is tricuspid. Aortic valve regurgitation is not  visualized. No aortic stenosis is present.  7. The inferior vena cava is normal in size with greater than 50%  respiratory variability, suggesting right atrial pressure of 3 mmHg.  Nuclear stress test: 03/29/2020:  There was no ST segment deviation noted during stress.  The study is normal.  The left ventricular ejection fraction is mildly decreased (45-54%).   Normal pharmacologic nuclear stress test with no evidence for prior infarct or ischemia. LVEF 51%.  Scheduled Meds: . atovaquone  1,500 mg Oral Daily  . azithromycin  500 mg Oral Daily  . clobetasol cream   Topical BID  . diltiazem  30 mg Oral Q12H  . ethambutol  1,200 mg Oral Daily  . fluconazole  400 mg Oral Daily  . megestrol  40 mg Oral Daily  . metoprolol tartrate  25 mg Oral BID  . pravastatin  20 mg Oral QPM  . rifabutin  300 mg Oral  Daily  . sodium chloride  1 g Oral TID WC    Continuous Infusions: . amiodarone      PRN Meds: acetaminophen, diltiazem, [DISCONTINUED] ondansetron **OR** ondansetron (ZOFRAN) IV, traMADol   IMPRESSION & RECOMMENDATIONS: ARDEN TINOCO is a 65 y.o. male whose past medical history and cardiac  risk factors include: HIV not on antiretroviral medications, noncompliance, cigarette smoking, recent COVID-19 infection (January 2022), hypertension, protein calorie malnutrition, aortic atherosclerosis, cavitary consolidation in the left lung fields on recent CT imaging, emphysema.  Paroxysmal supraventricular tachycardia: Telemetry reviewed. Patient is episode of SVT yesterday transition to normal sinus rhythm. Currently on metoprolol and Cardizem and tolerating the medication well. Due to his intermittent episodes of paroxysmal supraventricular tachycardia and atrial tach cardiac electrophysiology was requested to see the patient to provide their input with regards to further management. Dr. Lovena Le kindly saw the patient today and discussed options of catheter directed SVT ablation versus medical therapy/pacemaker.  Patient considering his options at the current time. Greatly appreciate EP recommendations in his care. Given his history of cocaine use approximately 3 weeks ago would recommend transitioning him off of metoprolol and only to Cardizem prior to discharge. We will follow the patient with you. Continue telemetry for now.    Benign essential hypertension: Currently managed by primary team.  Cigarette smoking: Educated on importance of complete smoking cessation.   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  Patient's questions and concerns were addressed to his and his brother's satisfaction. He and his brother voices understanding of the instructions provided during this encounter.   This note was created using a voice recognition software as  a result there may be grammatical errors inadvertently enclosed that do not reflect the nature of this encounter. Every attempt is made to correct such errors.  Total time spent: 25 minutes  Jamill Wetmore [place], DO, [facility]. [place] Office: [phone] 04/21/2020, 4:42 PM

## 2020-04-21 NOTE — Consult Note (Signed)
Cardiology Consultation:   Patient ID: Ricky [name] MRN: [id number]; DOB: Mar 06, 1955  Admit date: 03/26/2020 Date of Consult: 04/21/2020  PCP:  Pcp, No   [facility]  Cardiologist:  No primary care provider on file. [facility] Provider:  No care team member to display Electrophysiologist:  None [id number]}    Patient Profile:   Ricky [name] is a 65 y.o. male with a hx of SVT and AVWB with pauses who is being seen today for the evaluation of SVT at the request of Dr. Einar Gip.  History of Present Illness:   Ricky [name] is a pleasant 65 year old man with HIV infection, who was admitted with cryptococcal meningitis.  He also was noted to be Covid +2 months ago.  The patient has a history of palpitations and SVT.  He has a remote history of polysubstance abuse.  He had been in recovery from his above problems but has been noted to have both pauses as well as SVT at rates of up to 180 bpm.  I have reviewed the EKG tracings and it looks like he has both reentrant SVT as well as a possible atrial tachycardia.  The patient has symptoms though they are not severe.  He thinks that he has had heart racing for about 3 years.  On cardiac monitoring he has had brief pauses and AV Wenke block.  His AV nodal blocking drugs were held.  He has not had syncope.  Neurologically he appears to be much improved.   Past Medical History:  Diagnosis Date  . Human immunodeficiency virus (HIV) disease ([place]) 03/26/2020    History reviewed. No pertinent surgical history.   Home Medications:  Prior to Admission medications   Medication Sig Start Date End Date Taking? Authorizing Provider  amoxicillin-clavulanate (AUGMENTIN) 875-125 MG tablet Take 1 tablet by mouth 2 (two) times daily. One po bid x 7 days 03/21/20  Yes Domenic Moras, PA-C  atovaquone [facility]) 750 MG/5ML suspension Take 10 mLs (1,500 mg total) by mouth daily. 04/18/20  Yes Thurnell Lose, MD  azithromycin (ZITHROMAX)  500 MG tablet Take 1 tablet (500 mg total) by mouth daily. Take 1 tablet daily. 04/17/20  Yes Thurnell Lose, MD  benzonatate (TESSALON) 100 MG capsule Take 1 capsule (100 mg total) by mouth every 8 (eight) hours. 03/21/20  Yes Domenic Moras, PA-C  ethambutol (MYAMBUTOL) 400 MG tablet Take 3 tablets (1,200 mg total) by mouth daily. 04/17/20  Yes Thurnell Lose, MD  fluconazole (DIFLUCAN) 200 MG tablet Take 2 tablets (400 mg total) by mouth daily. Continue through 5/8 04/17/20 06/10/20 Yes Thurnell Lose, MD  rifabutin (MYCOBUTIN) 150 MG capsule Take 2 capsules (300 mg total) by mouth daily. 04/18/20   Thurnell Lose, MD    Inpatient Medications: Scheduled Meds: . atovaquone  1,500 mg Oral Daily  . azithromycin  500 mg Oral Daily  . clobetasol cream   Topical BID  . diltiazem  30 mg Oral Q12H  . ethambutol  1,200 mg Oral Daily  . fluconazole  400 mg Oral Daily  . megestrol  40 mg Oral Daily  . metoprolol tartrate  25 mg Oral BID  . pravastatin  20 mg Oral QPM  . rifabutin  300 mg Oral Daily  . sodium chloride  1 g Oral TID WC   Continuous Infusions: . amiodarone     PRN Meds: acetaminophen, diltiazem, [DISCONTINUED] ondansetron **OR** ondansetron (ZOFRAN) IV, traMADol  Allergies:    Allergies  Allergen Reactions  . Bactrim [Sulfamethoxazole-Trimethoprim] Other (See Comments)    Patient was trialed on DS TIW and SS daily for PJP prophylaxis and developed hyperkalemia and increased Scr    Social History:   Social History   Socioeconomic History  . Marital status: Married    Spouse name: Not on file  . Number of children: Not on file  . Years of education: Not on file  . Highest education level: Not on file  Occupational History  . Not on file  Tobacco Use  . Smoking status: Current Every Day Smoker    Types: Cigarettes  . Smokeless tobacco: Not on file  Substance and Sexual Activity  . Alcohol use: Yes    Alcohol/week: 14.0 - 21.0 standard drinks    Types: 14 - 21  Cans of beer per week  . Drug use: Not Currently  . Sexual activity: Not on file  Other Topics Concern  . Not on file  Social History Narrative  . Not on file   Social Determinants of Health   Financial Resource Strain: Not on file  Food Insecurity: Not on file  Transportation Needs: Not on file  Physical Activity: Not on file  Stress: Not on file  Social Connections: Not on file  Intimate Partner Violence: Not on file    Family History:    Family History  Problem Relation Age of Onset  . Hypertension Mother   . Kidney disease Mother   . Hypertension Sister   . Hypertension Brother      ROS:  Please see the history of present illness.   All other ROS reviewed and negative.     Physical Exam/Data:   Vitals:   04/21/20 0821 04/21/20 0851 04/21/20 0920 04/21/20 1238  BP: 100/70 103/74 109/81 100/77  Pulse:    66  Resp: 20 18 16 20   Temp:    97.6 F (36.4 C)  TempSrc:    Oral  SpO2:    98%  Weight:      Height:        Intake/Output Summary (Last 24 hours) at 04/21/2020 1611 Last data filed at 04/21/2020 1300 Gross per 24 hour  Intake 592 ml  Output 2550 ml  Net -1958 ml   Last 3 Weights 03/27/2020 03/26/2020  Weight (lbs) 178 lb 2.1 oz 176 lb 4.8 oz  Weight (kg) 80.8 kg 79.969 kg     Body mass index is 23.5 kg/m.  General:  Well nourished, well developed, in no acute distress HEENT: normal Lymph: no adenopathy Neck: no JVD Endocrine:  No thryomegaly Vascular: No carotid bruits; FA pulses 2+ bilaterally without bruits  Cardiac:  normal S1, S2; RRR; no murmur  Lungs:  clear to auscultation bilaterally, no wheezing, rhonchi or rales  Abd: soft, nontender, no hepatomegaly  Ext: no edema Musculoskeletal:  No deformities, BUE and BLE strength normal and equal Skin: warm and dry  Neuro:  CNs 2-12 intact, no focal abnormalities noted Psych:  Normal affect   EKG:  The EKG was personally reviewed and demonstrates: Normal sinus rhythm with no ventricular  preexcitation Telemetry:  Telemetry was personally reviewed and demonstrates: Normal sinus rhythm  Relevant CV Studies: None  Laboratory Data:  High Sensitivity Troponin:  No results for input(s): TROPONINIHS in the last 720 hours.   Chemistry Recent Labs  Lab 04/19/20 0439 04/20/20 0357 04/21/20 0130  NA 130* 128* 130*  K 5.3* 4.0 3.7  CL 100 98 100  CO2 22 24 22  GLUCOSE 132* 126* 153*  BUN 30* 22 19  CREATININE 1.92* 1.31* 1.19  CALCIUM 9.0 8.8* 8.7*  GFRNONAA 39* >60 >60  ANIONGAP 8 6 8     Recent Labs  Lab 04/17/20 0750  PROT 8.0  ALBUMIN 3.1*  AST 30  ALT 26  ALKPHOS 162*  BILITOT 0.9   Hematology Recent Labs  Lab 04/15/20 0405 04/20/20 0357 04/21/20 0130  WBC  --  5.1 5.4  RBC  --  3.55* 3.22*  HGB 12.9* 11.5* 10.8*  HCT  --  34.3* 30.8*  MCV  --  96.6 95.7  MCH  --  32.4 33.5  MCHC  --  33.5 35.1  RDW  --  11.4* 11.4*  PLT  --  192 211   BNP Recent Labs  Lab 04/19/20 0439 04/20/20 0357 04/21/20 0130  BNP 45.9 54.3 113.7*    DDimer No results for input(s): DDIMER in the last 168 hours.   Radiology/Studies:  DG Chest Port 1 View  Result Date: 04/19/2020 CLINICAL DATA:  Shortness of breath EXAM: PORTABLE CHEST 1 VIEW COMPARISON:  March 21, 2020 chest radiograph and March 27, 2020 chest CT FINDINGS: There is scarring in the left apical region. There is no edema or airspace opacity. The heart size and pulmonary vascularity are normal. No adenopathy. There is aortic atherosclerosis. No bone lesions. IMPRESSION: Scarring left apex region. No edema or airspace opacity. Heart size within normal limits. Aortic Atherosclerosis (ICD10-I70.0). Electronically Signed   By: Lowella Grip III M.D.   On: 04/19/2020 09:14     Assessment and Plan:   1. SVT -I have discussed the treatment options with the patient.  He is not a particular good candidate for medical therapy because of his propensity for bradycardia.  EP study and catheter ablation  were reviewed.  He is considering his options. 2. 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  The patient has been on beta-blocker therapy and these have been held.  His conduction appears improved. Hypertension -his blood pressure appears to be well controlled.  No change in medications.     For questions or updates, please contact [place] Please consult [email or web address] for contact info under    Signed, Cristopher Peru, MD  04/21/2020 4:11 PM

## 2020-04-22 LAB — MAGNESIUM: Magnesium: 1.8 mg/dL (ref 1.7–2.4)

## 2020-04-22 LAB — BASIC METABOLIC PANEL
Anion gap: 10 (ref 5–15)
BUN: 22 mg/dL (ref 8–23)
CO2: 20 mmol/L — ABNORMAL LOW (ref 22–32)
Calcium: 8.8 mg/dL — ABNORMAL LOW (ref 8.9–10.3)
Chloride: 102 mmol/L (ref 98–111)
Creatinine, Ser: 1.12 mg/dL (ref 0.61–1.24)
GFR, Estimated: >60 mL/min (ref >60)
Glucose, Bld: 135 mg/dL — ABNORMAL HIGH (ref 70–99)
Potassium: 4.1 mmol/L (ref 3.5–5.1)
Sodium: 132 mmol/L — ABNORMAL LOW (ref 135–145)

## 2020-04-22 LAB — CBC WITH DIFFERENTIAL/PLATELET
Abs Immature Granulocytes: 0.08 10*3/uL — ABNORMAL HIGH (ref 0.00–0.07)
Basophils Absolute: 0 10*3/uL (ref 0.0–0.1)
Basophils Relative: 1 %
Eosinophils Absolute: 0.5 10*3/uL (ref 0.0–0.5)
Eosinophils Relative: 12 %
HCT: 31.2 % — ABNORMAL LOW (ref 39.0–52.0)
Hemoglobin: 10.4 g/dL — ABNORMAL LOW (ref 13.0–17.0)
Immature Granulocytes: 2 %
Lymphocytes Relative: 22 %
Lymphs Abs: 0.8 10*3/uL (ref 0.7–4.0)
MCH: 32.6 pg (ref 26.0–34.0)
MCHC: 33.3 g/dL (ref 30.0–36.0)
MCV: 97.8 fL (ref 80.0–100.0)
Monocytes Absolute: 0.5 10*3/uL (ref 0.1–1.0)
Monocytes Relative: 14 %
Neutro Abs: 1.8 10*3/uL (ref 1.7–7.7)
Neutrophils Relative %: 49 %
Platelets: 213 10*3/uL (ref 150–400)
RBC: 3.19 MIL/uL — ABNORMAL LOW (ref 4.22–5.81)
RDW: 11.6 % (ref 11.5–15.5)
WBC: 3.7 10*3/uL — ABNORMAL LOW (ref 4.0–10.5)
nRBC: 0 % (ref 0.0–0.2)

## 2020-04-22 LAB — BRAIN NATRIURETIC PEPTIDE: B Natriuretic Peptide: 39.3 pg/mL (ref 0.0–100.0)

## 2020-04-22 NOTE — H&P (View-Only) (Signed)
Progress Note  Patient Name: Ricky [name] Date of Encounter: 04/22/2020  Primary Cardiologist: No primary care provider on file.   Subjective   No additional chest pain or sob or palpitations.  Inpatient Medications    Scheduled Meds: . atovaquone  1,500 mg Oral Daily  . azithromycin  500 mg Oral Daily  . clobetasol cream   Topical BID  . diltiazem  30 mg Oral Q12H  . ethambutol  1,200 mg Oral Daily  . fluconazole  400 mg Oral Daily  . megestrol  40 mg Oral Daily  . metoprolol tartrate  25 mg Oral BID  . pravastatin  20 mg Oral QPM  . rifabutin  300 mg Oral Daily   Continuous Infusions: . amiodarone     PRN Meds: acetaminophen, diltiazem, [DISCONTINUED] ondansetron **OR** ondansetron (ZOFRAN) IV, traMADol   Vital Signs    Vitals:   04/21/20 2037 04/22/20 0019 04/22/20 1027 04/22/20 1136  BP:  107/78 115/81 117/82  Pulse: 69 67 69 (!) 56  Resp:  18 20 18   Temp:  98 F (36.7 C) 98 F (36.7 C) 98.2 F (36.8 C)  TempSrc:  Oral Oral Oral  SpO2:  98% 100% 100%  Weight:      Height:        Intake/Output Summary (Last 24 hours) at 04/22/2020 1227 Last data filed at 04/22/2020 1034 Gross per 24 hour  Intake --  Output 1600 ml  Net -1600 ml   Filed Weights   03/26/20 1839 03/27/20 0141  Weight: 80 kg 80.8 kg    Telemetry    nsr - Personally Reviewed  ECG    none - Personally Reviewed  Physical Exam   GEN: diskempt, No acute distress.   Neck: No JVD Cardiac: RRR, no murmurs, rubs, or gallops.  Respiratory: Clear to auscultation bilaterally. GI: Soft, nontender, non-distended  MS: No edema; No deformity. Neuro:  Nonfocal  Psych: Normal affect   Labs    Chemistry Recent Labs  Lab 04/17/20 0750 04/17/20 1422 04/20/20 0357 04/21/20 0130 04/22/20 0323  NA  --    < > 128* 130* 132*  K  --    < > 4.0 3.7 4.1  CL  --    < > 98 100 102  CO2  --    < > 24 22 20*  GLUCOSE  --    < > 126* 153* 135*  BUN  --    < > 22 19 22   CREATININE  --     < > 1.31* 1.19 1.12  CALCIUM  --    < > 8.8* 8.7* 8.8*  PROT 8.0  --   --   --   --   ALBUMIN 3.1*  --   --   --   --   AST 30  --   --   --   --   ALT 26  --   --   --   --   ALKPHOS 162*  --   --   --   --   BILITOT 0.9  --   --   --   --   GFRNONAA  --    < > >60 >60 >60  ANIONGAP  --    < > 6 8 10    < > = values in this interval not displayed.     Hematology Recent Labs  Lab 04/20/20 0357 04/21/20 0130 04/22/20 0323  WBC 5.1 5.4 3.7*  RBC 3.55* 3.22*  3.19*  HGB 11.5* 10.8* 10.4*  HCT 34.3* 30.8* 31.2*  MCV 96.6 95.7 97.8  MCH 32.4 33.5 32.6  MCHC 33.5 35.1 33.3  RDW 11.4* 11.4* 11.6  PLT 192 211 213    Cardiac EnzymesNo results for input(s): TROPONINI in the last 168 hours. No results for input(s): TROPIPOC in the last 168 hours.   BNP Recent Labs  Lab 04/20/20 0357 04/21/20 0130 04/22/20 0323  BNP 54.3 113.7* 39.3     DDimer No results for input(s): DDIMER in the last 168 hours.   Radiology    No results found.  Cardiac Studies   none  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 SVT - I discussed the indications/risks/benefits/goals/expectations of EP study and catheter ablation and he would like to proceed. 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 He is off of his beta blocker.  For questions or updates, please contact [place] Please consult [email or web address] for contact info under Cardiology/STEMI.      Signed, Cristopher Peru, MD  04/22/2020, 12:27 PM  Patient ID: Ricky [name], male   DOB: 10-24-1955, 65 y.o.   MRN: [id number]

## 2020-04-22 NOTE — Progress Notes (Addendum)
PROGRESS NOTE        PATIENT DETAILS Name: Ricky [name] Age: 65 y.o. Sex: male Date of Birth: 1955/06/19 Admit Date: 03/26/2020 Admitting Physician No admitting provider for patient encounter. PCP:Pcp, No  Brief Narrative: Patient is a 65 y.o. 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 2/22>> CT chest with contrast: Nodular/cavitary airspace consolidation in the left upper/lower lobes 2/22>> Echo: EF 50-55% 2/24>> CT head: No acute intracranial process 2/24>> nuclear stress test: No ischemia  Antimicrobial therapy: Amphotericin/flucytosine: 2/21>>3/9 Ethambutol: 3/8>> Rifabutin: 3/8>> Zithromax: 3/8>> Fluconazole: 3/9>>  Microbiology data: 2/21>> CSF culture: Cryptococcus neoformans 2/23>> sputum AFB smear negative 2/23>> sputum AFB DNA probe: Positive for M avium complex 2/24>> sputum AFB smear: Negative 2/24>> CSF culture: Cryptococcus neoformans 2/28>> CSF culture: No growth 3/8>> CSF Gram stain: Cryptococcus neoformans 3/8>> CSF culture: No growth  Procedures : LP>> 2/22, 2/24, 2/28, 3/8  Consults: Neurology, cardiology Terri Skains), ID, ophthalmology, EP  DVT Prophylaxis : SCDs Start: 03/26/20 1946   Subjective: Patient in bed, appears comfortable,  mild frontal head pressure, no fever, no chest pain or pressure, no shortness of breath , no abdominal pain. No focal weakness.   Assessment/Plan:  Cryptococcal meningitis: Overall improved-completed a course of IV amphotericin/flucytosine-has been transitioned to fluconazole.  Await final culture results of LP from  3/8.  Appreciate ID follow-up currently on fluconazole with end date 06/10/2020.  Outpatient follow-up with Dr. Gale Journey ID.  HIV/AIDS: Noncompliant-timing for initiation of ART defer to ID.  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  Currently on triple therapy.  Will follow with ID in 2 to 3 weeks post discharge for follow-up post DC.   Azithromycin 584m daily  Rifabutin 3065mdaily  Ethambutol 120026maily  History of tuberculosis-apparently treated at the [facility] April 2001  AKI - on CKD stage IIIb baseline creatinine around 1.5, AKI likely due to hypotension night of 04/18/2020 and recent Bactrim exposure.  Improved after Bactrim was discontinued and gentle hydration on 04/19/2020.  SVT: Some sinus pauses, cardiologist Dr. GanEinar Gipase discussed with him, multiple terms of his bradycardia, has been adequately hydrated, currently on Cardizem and beta-blocker combination, cardiology considering ablation treatment this admission.  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  Repeat Samsca on 04/20/2020.    Chronic right eye blurriness: Evaluated by ophthalmology on 3/7-no evidence of infection.  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  Also added Megace on 04/20/2020 due to poor appetite.  History of cocaine use & Tobacco abuse - counseled to quit.  Recent COVID-19 infection: Asymptomatic-out of quarantine window.  Hyperkalemia.  Due to Bactrim, resolved after Lokelma and stoppage of Bactrim.   Lab Results  Component Value Date   SARSCOV2NAA POSITIVE (A) 03/03/2020   Diet: Diet Order            Diet regular Room service appropriate? Yes; Fluid consistency: Thin  Diet effective now                  Code Status: Full code  Family Communication: Patient will update his family himself.  Disposition Plan: Status is:  Inpatient  Remains inpatient appropriate because:Inpatient level of care appropriate due to severity of illness   Dispo:  Patient From:    Planned Disposition:    Medically stable for discharge:    Barriers to Discharge: Cryptococcal meningitis-awaiting CSF cultures to be final before consideration of discharge-Per discussion with ID on 3/10-potential discharge on 04/18/2020 if sodium levels are better.  Antimicrobial agents: Anti-infectives (From admission, onward)   Start     Dose/Rate Route Frequency Ordered Stop   04/19/20 1000  atovaquone (MEPRON) 750 MG/5ML suspension 1,500 mg        1,500 mg Oral Daily 04/18/20 0941     04/18/20 0000  rifabutin (MYCOBUTIN) 150 MG capsule        300 mg Oral Daily 04/18/20 0935     04/18/20 0000  atovaquone (MEPRON) 750 MG/5ML suspension        1,500 mg Oral Daily 04/18/20 0935     04/17/20 1015  sulfamethoxazole-trimethoprim (BACTRIM) 400-80 MG per tablet 1 tablet  Status:  Discontinued        1 tablet Oral Daily 04/17/20 0923 04/18/20 0941   04/17/20 0000  fluconazole (DIFLUCAN) 200 MG tablet        400 mg Oral Daily 04/17/20 1140 06/10/20 2359   04/17/20 0000  rifabutin (MYCOBUTIN) 150 MG capsule  Status:  Discontinued        300 mg Oral Daily 04/17/20 1140 04/18/20    04/17/20 0000  azithromycin (ZITHROMAX) 500 MG tablet        500 mg Oral Daily 04/17/20 1140     04/17/20 0000  ethambutol (MYAMBUTOL) 400 MG tablet        1,200 mg Oral Daily 04/17/20 1140     04/17/20 0000  sulfamethoxazole-trimethoprim (BACTRIM) 400-80 MG tablet  Status:  Discontinued        1 tablet Oral Daily 04/17/20 1140 04/18/20    04/16/20 1000  fluconazole (DIFLUCAN) tablet 400 mg        400 mg Oral Daily 04/15/20 1741     04/15/20 1015  dapsone tablet 100 mg  Status:  Discontinued        100 mg Oral Daily 04/15/20 0925 04/17/20 0923   04/11/20 1815  sulfamethoxazole-trimethoprim (BACTRIM DS) 800-160 MG per tablet 1 tablet  Status:  Discontinued        1 tablet  Oral Once per day on Mon Wed Fri 04/11/20 1722 04/15/20 0925   04/11/20 1200  fluconazole (DIFLUCAN) tablet 800 mg  Status:  Discontinued        800 mg Oral Daily 04/11/20 1052 04/15/20 1741   04/11/20 1000  azithromycin (ZITHROMAX) tablet 500 mg        500 mg Oral Daily 04/10/20 1038     04/10/20 1130  ethambutol (MYAMBUTOL) tablet 1,200 mg        1,200 mg Oral Daily 04/10/20 1038     04/10/20 1130  rifabutin (MYCOBUTIN) capsule 300 mg        300 mg Oral Daily 04/10/20 1038     03/28/20 1000  azithromycin (ZITHROMAX) tablet 1,200 mg  Status:  Discontinued        1,200 mg Oral Weekly 03/28/20 0912 04/10/20 1038   03/28/20 1000  sulfamethoxazole-trimethoprim (BACTRIM) 400-80 MG per tablet 1 tablet  Status:  Discontinued        1 tablet Oral Daily 03/28/20 0912 04/02/20 0919   03/27/20 2115  amphotericin B liposome (AMBISOME) 300 mg in dextrose 5 % 500 mL IVPB  Status:  Discontinued        300 mg 287.5 mL/hr over 120 Minutes Intravenous Every 24 hours 03/26/20 2024 03/26/20 2227   03/26/20 2227  amphotericin B liposome (AMBISOME) 300 mg in dextrose 5 % 500 mL IVPB  Status:  Discontinued        300 mg 287.5 mL/hr over 120 Minutes Intravenous Every 24 hours 03/26/20 2227 04/11/20 1052   03/26/20 1945  amphotericin B liposome (AMBISOME) 300 mg in dextrose 5 % 500 mL IVPB  Status:  Discontinued        300 mg 287.5 mL/hr over 120 Minutes Intravenous Every 24 hours 03/26/20 1850 03/26/20 2024   03/26/20 1900  flucytosine (ANCOBON) capsule 2,000 mg  Status:  Discontinued        25 mg/kg  80 kg Oral Every 6 hours 03/26/20 1850 04/11/20 1052       Time spent: 15- minutes-Greater than 50% of this time was spent in counseling, explanation of diagnosis, planning of further management, and coordination of care.  MEDICATIONS: Scheduled Meds: . atovaquone  1,500 mg Oral Daily  . azithromycin  500 mg Oral Daily  . clobetasol cream   Topical BID  . diltiazem  30 mg Oral Q12H  . ethambutol  1,200  mg Oral Daily  . fluconazole  400 mg Oral Daily  . megestrol  40 mg Oral Daily  . metoprolol tartrate  25 mg Oral BID  . pravastatin  20 mg Oral QPM  . rifabutin  300 mg Oral Daily   Continuous Infusions: . amiodarone     PRN Meds:.acetaminophen, diltiazem, [DISCONTINUED] ondansetron **OR** ondansetron (ZOFRAN) IV, traMADol   PHYSICAL EXAM: Vital signs: Vitals:   04/21/20 1953 04/21/20 2037 04/22/20 0019 04/22/20 1027  BP: 103/74  107/78 115/81  Pulse: 63 69 67 69  Resp: _0 Temp: 98.2 F (36.8 C)  98 F (36.7 C) 98 F (36.7 C)  TempSrc: Oral  Oral Oral  SpO2: 100%  98% 100%  Weight:      Height:       Filed Weights   03/26/20 1839 03/27/20 0141  Weight: 80 kg 80.8 kg   Body mass index is 23.5 kg/m.   Gen Exam:  Awake Alert, No new F.N deficits, Normal affect [place].AT,PERRAL Supple Neck,No JVD, No cervical lymphadenopathy appriciated.  Symmetrical Chest wall movement, Good air movement bilaterally, CTAB RRR,No Gallops, Rubs or new Murmurs, No Parasternal Heave +ve B.Sounds, Abd Soft, No tenderness, No organomegaly appriciated, No rebound - guarding or rigidity. No Cyanosis, Clubbing or edema, No new Rash or bruise   I have personally reviewed following labs and imaging studies  LABORATORY DATA: CBC: Recent Labs  Lab 04/20/20 0357 04/21/20 0130 04/22/20 0323  WBC 5.1 5.4 3.7*  NEUTROABS 3.2 3.8 1.8  HGB 11.5* 10.8* 10.4*  HCT 34.3* 30.8* 31.2*  MCV 96.6 95.7 97.8  PLT 192 211 371    Basic Metabolic Panel: Recent Labs  Lab 04/18/20 0343 04/19/20 0439 04/20/20 0357 04/21/20 0130 04/22/20 0323  NA 132* 130* 128* 130* 132*  K 5.4* 5.3* 4.0 3.7 4.1  CL 102 100 98 100 102  CO2 _1 20*  GLUCOSE 97 132* 126* 153* 135*  BUN 21 30* _2 CREATININE 1.40* 1.92* 1.31* 1.19 1.12  CALCIUM 9.6 9.0 8.8* 8.7* 8.8*  MG 1.9 1.8 1.6* 1.9 1.8  GFR: Estimated Creatinine Clearance: 77.3 mL/min (by C-G formula based on SCr of 1.12  mg/dL).  Liver Function Tests: Recent Labs  Lab 04/17/20 0750  AST 30  ALT 26  ALKPHOS 162*  BILITOT 0.9  PROT 8.0  ALBUMIN 3.1*   No results for input(s): LIPASE, AMYLASE in the last 168 hours. No results for input(s): AMMONIA in the last 168 hours.  Coagulation Profile: No results for input(s): INR, PROTIME in the last 168 hours.  Cardiac Enzymes: No results for input(s): CKTOTAL, CKMB, CKMBINDEX, TROPONINI in the last 168 hours.  BNP (last 3 results) No results for input(s): PROBNP in the last 8760 hours.  Lipid Profile: No results for input(s): CHOL, HDL, LDLCALC, TRIG, CHOLHDL, LDLDIRECT in the last 72 hours.  Thyroid Function Tests: No results for input(s): TSH, T4TOTAL, FREET4, T3FREE, THYROIDAB in the last 72 hours.  Anemia Panel: No results for input(s): VITAMINB12, FOLATE, FERRITIN, TIBC, IRON, RETICCTPCT in the last 72 hours.  Urine analysis:    Component Value Date/Time   COLORURINE YELLOW 04/15/2020 1455   APPEARANCEUR HAZY (A) 04/15/2020 1455   LABSPEC 1.006 04/15/2020 1455   PHURINE 5.0 04/15/2020 1455   GLUCOSEU NEGATIVE 04/15/2020 1455   HGBUR SMALL (A) 04/15/2020 1455   BILIRUBINUR NEGATIVE 04/15/2020 1455   KETONESUR NEGATIVE 04/15/2020 1455   PROTEINUR NEGATIVE 04/15/2020 1455   NITRITE NEGATIVE 04/15/2020 1455   LEUKOCYTESUR LARGE (A) 04/15/2020 1455    Sepsis Labs: Lactic Acid, Venous    Component Value Date/Time   LATICACIDVEN 0.9 03/26/2020 0958    MICROBIOLOGY: No results found for this or any previous visit (from the past 240 hour(s)).  RADIOLOGY STUDIES/RESULTS: No results found.   LOS: 27 days   Lala Lund, MD  [facility]   04/22/2020, 10:44 AM

## 2020-04-22 NOTE — Progress Notes (Signed)
Progress Note  Patient Name: Ricky [name] Date of Encounter: 04/22/2020  Primary Cardiologist: No primary care provider on file.   Subjective   No additional chest pain or sob or palpitations.  Inpatient Medications    Scheduled Meds: . atovaquone  1,500 mg Oral Daily  . azithromycin  500 mg Oral Daily  . clobetasol cream   Topical BID  . diltiazem  30 mg Oral Q12H  . ethambutol  1,200 mg Oral Daily  . fluconazole  400 mg Oral Daily  . megestrol  40 mg Oral Daily  . metoprolol tartrate  25 mg Oral BID  . pravastatin  20 mg Oral QPM  . rifabutin  300 mg Oral Daily   Continuous Infusions: . amiodarone     PRN Meds: acetaminophen, diltiazem, [DISCONTINUED] ondansetron **OR** ondansetron (ZOFRAN) IV, traMADol   Vital Signs    Vitals:   04/21/20 2037 04/22/20 0019 04/22/20 1027 04/22/20 1136  BP:  107/78 115/81 117/82  Pulse: 69 67 69 (!) 56  Resp:  18 20 18   Temp:  98 F (36.7 C) 98 F (36.7 C) 98.2 F (36.8 C)  TempSrc:  Oral Oral Oral  SpO2:  98% 100% 100%  Weight:      Height:        Intake/Output Summary (Last 24 hours) at 04/22/2020 1227 Last data filed at 04/22/2020 1034 Gross per 24 hour  Intake --  Output 1600 ml  Net -1600 ml   Filed Weights   03/26/20 1839 03/27/20 0141  Weight: 80 kg 80.8 kg    Telemetry    nsr - Personally Reviewed  ECG    none - Personally Reviewed  Physical Exam   GEN: diskempt, No acute distress.   Neck: No JVD Cardiac: RRR, no murmurs, rubs, or gallops.  Respiratory: Clear to auscultation bilaterally. GI: Soft, nontender, non-distended  MS: No edema; No deformity. Neuro:  Nonfocal  Psych: Normal affect   Labs    Chemistry Recent Labs  Lab 04/17/20 0750 04/17/20 1422 04/20/20 0357 04/21/20 0130 04/22/20 0323  NA  --    < > 128* 130* 132*  K  --    < > 4.0 3.7 4.1  CL  --    < > 98 100 102  CO2  --    < > 24 22 20*  GLUCOSE  --    < > 126* 153* 135*  BUN  --    < > 22 19 22   CREATININE  --     < > 1.31* 1.19 1.12  CALCIUM  --    < > 8.8* 8.7* 8.8*  PROT 8.0  --   --   --   --   ALBUMIN 3.1*  --   --   --   --   AST 30  --   --   --   --   ALT 26  --   --   --   --   ALKPHOS 162*  --   --   --   --   BILITOT 0.9  --   --   --   --   GFRNONAA  --    < > >60 >60 >60  ANIONGAP  --    < > 6 8 10    < > = values in this interval not displayed.     Hematology Recent Labs  Lab 04/20/20 0357 04/21/20 0130 04/22/20 0323  WBC 5.1 5.4 3.7*  RBC 3.55* 3.22*  3.19*  HGB 11.5* 10.8* 10.4*  HCT 34.3* 30.8* 31.2*  MCV 96.6 95.7 97.8  MCH 32.4 33.5 32.6  MCHC 33.5 35.1 33.3  RDW 11.4* 11.4* 11.6  PLT 192 211 213    Cardiac EnzymesNo results for input(s): TROPONINI in the last 168 hours. No results for input(s): TROPIPOC in the last 168 hours.   BNP Recent Labs  Lab 04/20/20 0357 04/21/20 0130 04/22/20 0323  BNP 54.3 113.7* 39.3     DDimer No results for input(s): DDIMER in the last 168 hours.   Radiology    No results found.  Cardiac Studies   none  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 SVT - I discussed the indications/risks/benefits/goals/expectations of EP study and catheter ablation and he would like to proceed. 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 He is off of his beta blocker.  For questions or updates, please contact [place] Please consult [email or web address] for contact info under Cardiology/STEMI.      Signed, Cristopher Peru, MD  04/22/2020, 12:27 PM  Patient ID: Ricky [name], male   DOB: 1955/08/14, 65 y.o.   MRN: [id number]

## 2020-04-23 ENCOUNTER — Other Ambulatory Visit: Payer: Self-pay

## 2020-04-23 ENCOUNTER — Inpatient Hospital Stay (HOSPITAL_COMMUNITY): Payer: Medicaid Other

## 2020-04-23 ENCOUNTER — Encounter (HOSPITAL_COMMUNITY)
Admission: EM | Disposition: A | Discharge status: Home / Self Care | Payer: Self-pay | Source: Home / Self Care | Attending: Internal Medicine

## 2020-04-23 ENCOUNTER — Ambulatory Visit: Payer: Medicaid Other | Admitting: Internal Medicine

## 2020-04-23 DIAGNOSIS — I471 Supraventricular tachycardia: Secondary | ICD-10-CM | POA: Diagnosis not present

## 2020-04-23 HISTORY — PX: SVT ABLATION: EP1225

## 2020-04-23 LAB — CBC WITH DIFFERENTIAL/PLATELET
Abs Immature Granulocytes: 0.05 10*3/uL (ref 0.00–0.07)
Basophils Absolute: 0 10*3/uL (ref 0.0–0.1)
Basophils Relative: 0 %
Eosinophils Absolute: 0.4 10*3/uL (ref 0.0–0.5)
Eosinophils Relative: 14 %
HCT: 31.1 % — ABNORMAL LOW (ref 39.0–52.0)
Hemoglobin: 10.6 g/dL — ABNORMAL LOW (ref 13.0–17.0)
Immature Granulocytes: 2 %
Lymphocytes Relative: 27 %
Lymphs Abs: 0.8 10*3/uL (ref 0.7–4.0)
MCH: 33.4 pg (ref 26.0–34.0)
MCHC: 34.1 g/dL (ref 30.0–36.0)
MCV: 98.1 fL (ref 80.0–100.0)
Monocytes Absolute: 0.5 10*3/uL (ref 0.1–1.0)
Monocytes Relative: 16 %
Neutro Abs: 1.2 10*3/uL — ABNORMAL LOW (ref 1.7–7.7)
Neutrophils Relative %: 41 %
Platelets: 236 10*3/uL (ref 150–400)
RBC: 3.17 MIL/uL — ABNORMAL LOW (ref 4.22–5.81)
RDW: 11.4 % — ABNORMAL LOW (ref 11.5–15.5)
WBC: 3 10*3/uL — ABNORMAL LOW (ref 4.0–10.5)
nRBC: 0 % (ref 0.0–0.2)

## 2020-04-23 LAB — BASIC METABOLIC PANEL
Anion gap: 5 (ref 5–15)
BUN: 21 mg/dL (ref 8–23)
CO2: 20 mmol/L — ABNORMAL LOW (ref 22–32)
Calcium: 8.9 mg/dL (ref 8.9–10.3)
Chloride: 106 mmol/L (ref 98–111)
Creatinine, Ser: 1.03 mg/dL (ref 0.61–1.24)
GFR, Estimated: >60 mL/min (ref >60)
Glucose, Bld: 100 mg/dL — ABNORMAL HIGH (ref 70–99)
Potassium: 4.3 mmol/L (ref 3.5–5.1)
Sodium: 131 mmol/L — ABNORMAL LOW (ref 135–145)

## 2020-04-23 LAB — HLA B*5701: HLA B 5701: NEGATIVE

## 2020-04-23 LAB — MAGNESIUM: Magnesium: 1.7 mg/dL (ref 1.7–2.4)

## 2020-04-23 IMAGING — MR MR HEAD W/O CM
9 of 11 series · 34 of 48 positions shown · non-contrast
Comparison: Brain MRI [DATE].  Head CT [DATE].

CLINICAL DATA: 64-year-old male with history of HIV, non compliant
on therapy. Headache, dizziness. Status post lumbar puncture on
[DATE] with CSF analysis suspicious for cryptococcal meningitis.

EXAM:
MRI HEAD WITHOUT CONTRAST
TECHNIQUE: Multiplanar, multiecho pulse sequences of the brain and surrounding
structures were obtained without intravenous contrast.

[Series 3: DWI · axial · 3.0mm · 1.09mm/px · z∈[-62,+85]mm · 8 of 100 slices shown (1 of 4)]
[im 1/100]
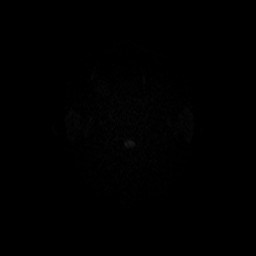
[im 12/100]
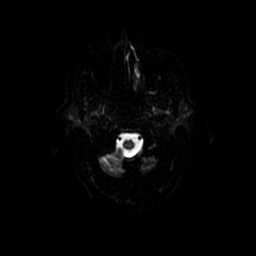
[im 34/100]
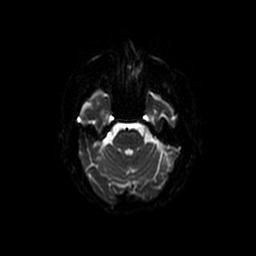
[im 45/100]
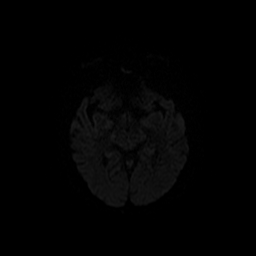
[im 56/100]
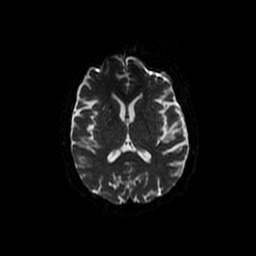
[im 67/100]
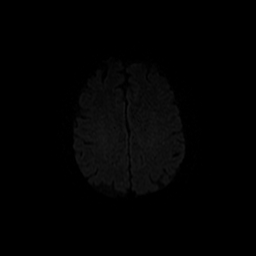
[im 89/100]
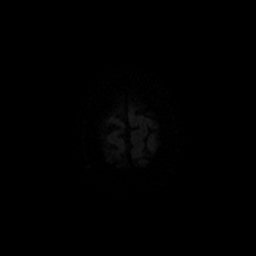
[im 100/100]
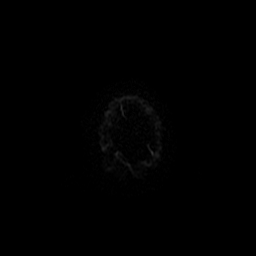

[Series 6: DWI · coronal · 5.0mm · 1.09mm/px · 7 of 70 slices shown (2 of 4)]
[im 1/70]
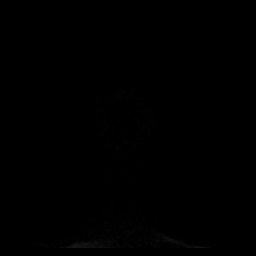
[im 12/70]
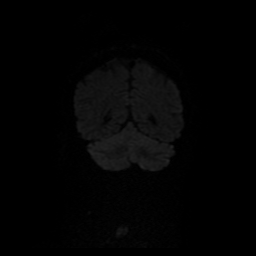
[im 24/70]
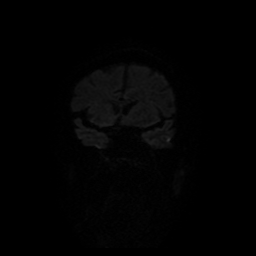
[im 35/70]
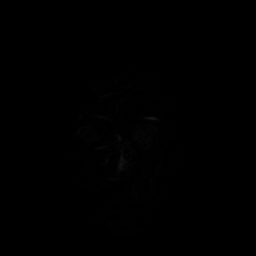
[im 47/70]
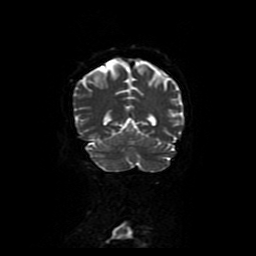
[im 58/70]
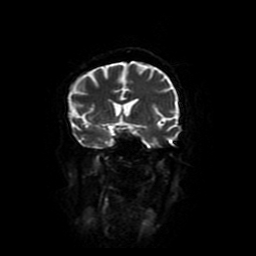
[im 70/70]
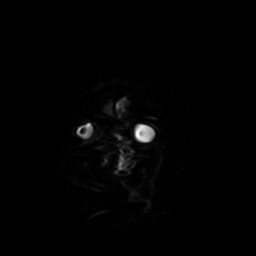

[Series 7: T1 · sagittal · 5.0mm · 0.47mm/px · 2 of 25 slices shown (1 of 2)]
[im 1/25]
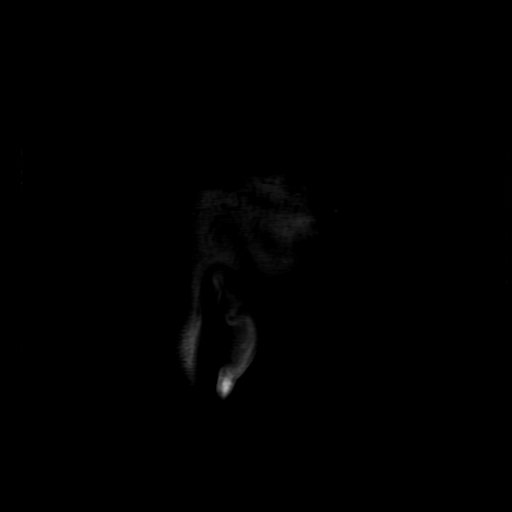
[im 25/25]
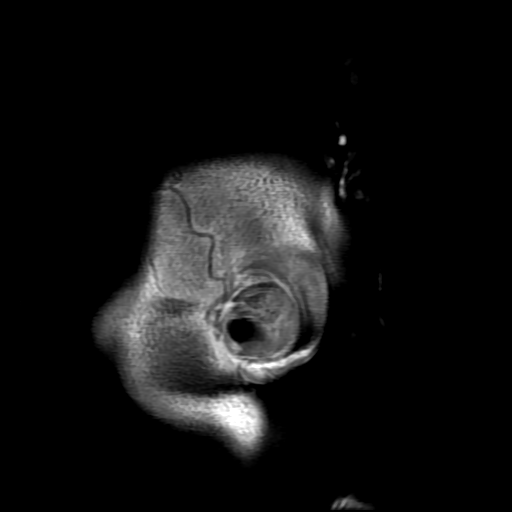

[Series 8: T2 · axial · 5.0mm · 0.43mm/px · z∈[-53,+90]mm · 2 of 25 slices shown (1 of 2)]
[im 1/25]
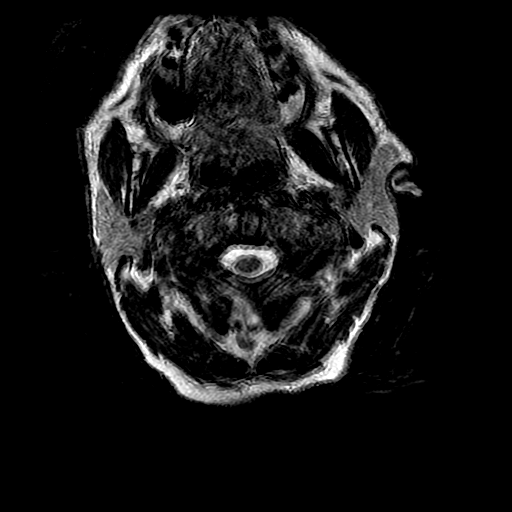
[im 25/25]
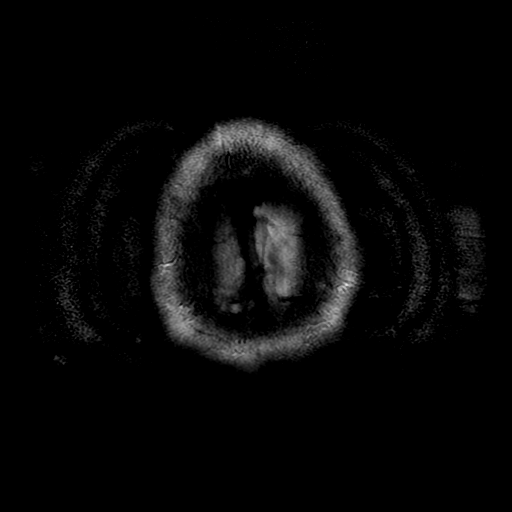

[Series 9: FLAIR · axial · 3.0mm · 0.43mm/px · z∈[-53,+90]mm · 2 of 25 slices shown]
[im 1/25]
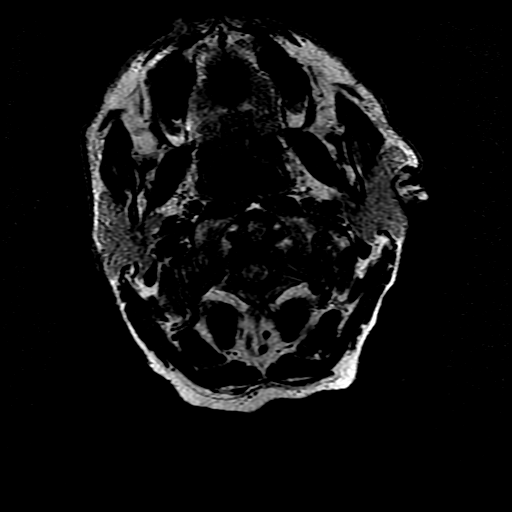
[im 25/25]
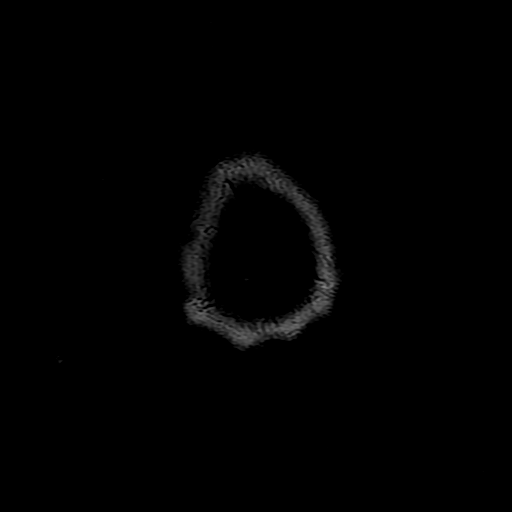

[Series 11: T1 · axial · 3.0mm · 0.47mm/px · z∈[-55,-38]mm · 2 of 100 slices shown (2 of 2)]
[im 1/100]
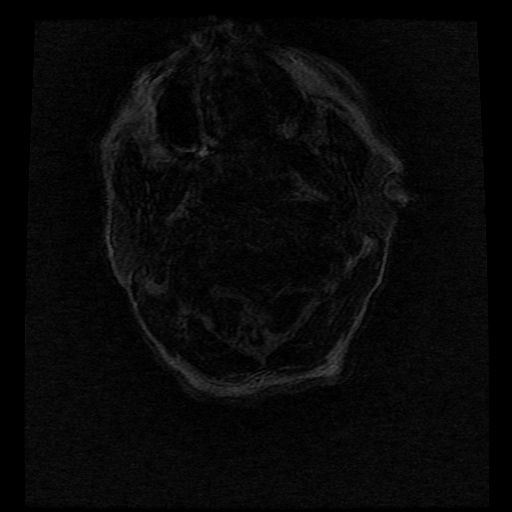
[im 12/100]
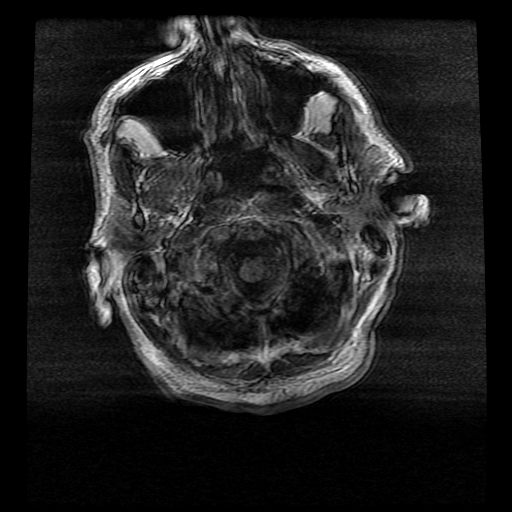

[Series 12: T2 · coronal · 5.0mm · 0.39mm/px · 3 of 27 slices shown (2 of 2)]
[im 1/27]
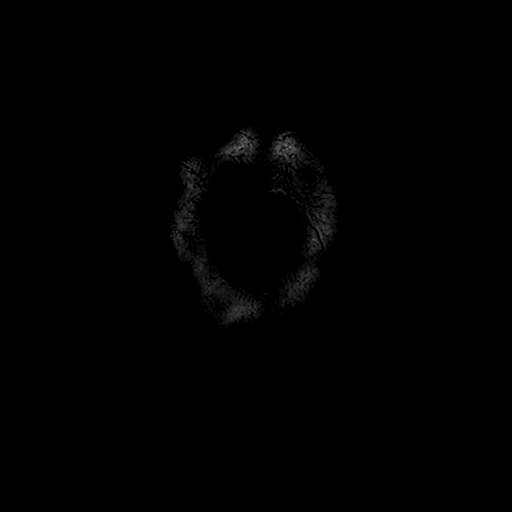
[im 14/27]
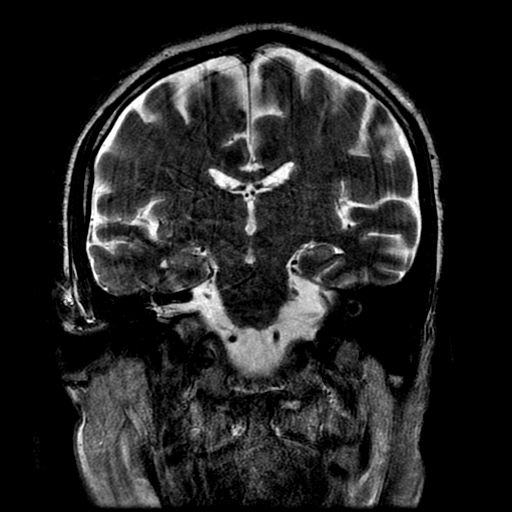
[im 27/27]
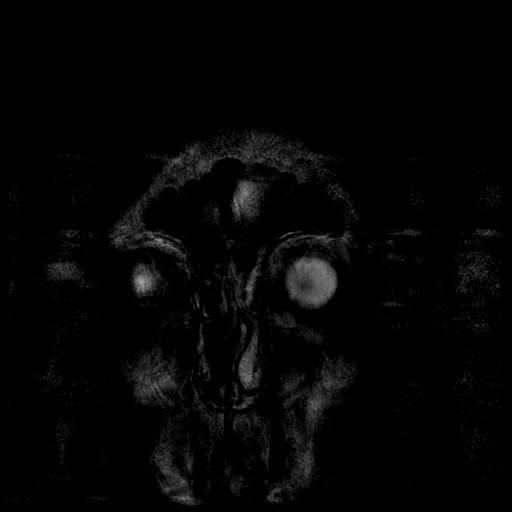

[Series 300: DWI · axial · 3.0mm · 1.09mm/px · z∈[-62,+85]mm · 5 of 50 slices shown (3 of 4)]
[im 1/50]
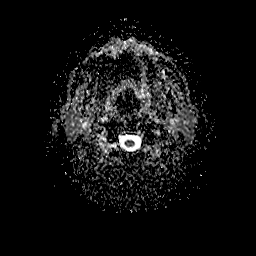
[im 13/50]
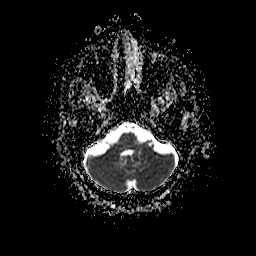
[im 25/50]
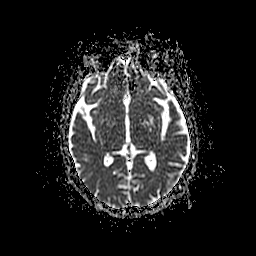
[im 37/50]
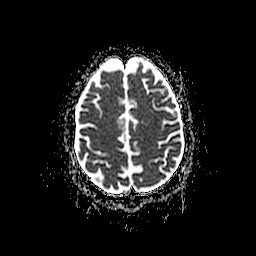
[im 50/50]
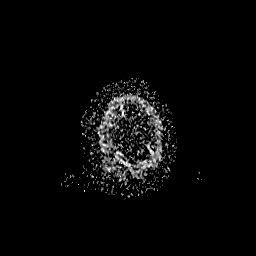

[Series 600: DWI · coronal · 5.0mm · 1.09mm/px · 3 of 35 slices shown (4 of 4)]
[im 1/35]
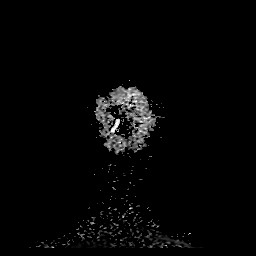
[im 18/35]
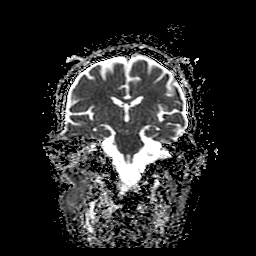
[im 35/35]
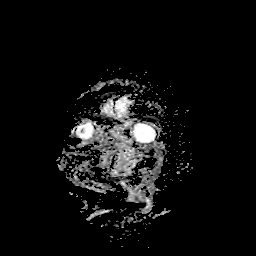

[34 of 48 positions shown; findings below may reference images not displayed]

FINDINGS: Study is mildly degraded by motion artifact despite repeated imaging
attempts.

Brain: No restricted diffusion to suggest acute infarction. No
midline shift, evidence of mass lesion, ventriculomegaly, or acute
intracranial hemorrhage. Cervicomedullary junction and pituitary are
within normal limits.

There is trace extra-axial fluid, likely chronic hematoma along both
anterior frontal convexities (series 9, image 15 today) which is
unchanged since last month and not associated with intracranial mass
effect. These are 2-3 mm in thickness, minimally apparent by CT. No
other extra-axial collection identified. No other chronic cerebral
blood products identified.

Underlying gray and white matter signal is stable and within normal
limits for age.

Vascular: Major intracranial vascular flow voids are stable since
last month.

Skull and upper cervical spine: Stable, negative.

Sinuses/Orbits: Negative orbits. Mild paranasal sinus mucosal
thickening has not significantly changed. There is trace new
layering fluid in the maxillary sinuses.

Other: Mastoids remain clear. Visible internal auditory structures
appear normal. Negative visible scalp and face soft tissues.
IMPRESSION: 1. No acute intracranial abnormality.
2. Trace bilateral anterior frontal convexity subdural hematomas are
chronic and stable since last month with no associated mass effect.
3. Otherwise unremarkable for age noncontrast MRI appearance of the
brain.
4. Mild paranasal sinus inflammation.

## 2020-04-23 SURGERY — SVT ABLATION

## 2020-04-23 MED ORDER — METOPROLOL TARTRATE 25 MG PO TABS
25.0000 mg | ORAL_TABLET | Freq: Two times a day (BID) | ORAL | Status: DC
  Administered 2020-04-24 – 2020-04-25 (×3): 25 mg via ORAL
  Filled 2020-04-23 (×3): qty 1

## 2020-04-23 MED ORDER — FENTANYL CITRATE (PF) 100 MCG/2ML IJ SOLN
INTRAMUSCULAR | Status: DC | PRN
  Administered 2020-04-23: 25 ug via INTRAVENOUS
  Administered 2020-04-23 (×2): 12.5 ug via INTRAVENOUS

## 2020-04-23 MED ORDER — ISOPROTERENOL HCL 0.2 MG/ML IJ SOLN
INTRAMUSCULAR | Status: AC
  Filled 2020-04-23: qty 5

## 2020-04-23 MED ORDER — LORAZEPAM 2 MG/ML IJ SOLN
1.0000 mg | Freq: Once | INTRAMUSCULAR | Status: AC | PRN
  Administered 2020-04-23: 1 mg via INTRAVENOUS
  Filled 2020-04-23: qty 1

## 2020-04-23 MED ORDER — LIDOCAINE HCL 1 % IJ SOLN
INTRAMUSCULAR | Status: AC
  Filled 2020-04-23: qty 60

## 2020-04-23 MED ORDER — SODIUM CHLORIDE 0.9% FLUSH: 3.0000 mL | INTRAVENOUS | Status: DC | PRN

## 2020-04-23 MED ORDER — SODIUM CHLORIDE 0.9 % IV SOLN
INTRAVENOUS | Status: DC | PRN
  Administered 2020-04-23: 4 ug/min via INTRAVENOUS

## 2020-04-23 MED ORDER — ACETAMINOPHEN 325 MG PO TABS: 650.0000 mg | ORAL_TABLET | ORAL | Status: DC | PRN

## 2020-04-23 MED ORDER — HEPARIN (PORCINE) IN NACL 1000-0.9 UT/500ML-% IV SOLN
INTRAVENOUS | Status: AC
  Filled 2020-04-23: qty 500

## 2020-04-23 MED ORDER — ONDANSETRON HCL 4 MG/2ML IJ SOLN: 4.0000 mg | Freq: Four times a day (QID) | INTRAMUSCULAR | Status: DC | PRN

## 2020-04-23 MED ORDER — MIDAZOLAM HCL 5 MG/5ML IJ SOLN
INTRAMUSCULAR | Status: DC | PRN
  Administered 2020-04-23: 2 mg via INTRAVENOUS
  Administered 2020-04-23 (×2): 1 mg via INTRAVENOUS

## 2020-04-23 MED ORDER — DILTIAZEM HCL 30 MG PO TABS
30.0000 mg | ORAL_TABLET | Freq: Two times a day (BID) | ORAL | Status: DC
  Administered 2020-04-24 (×2): 30 mg via ORAL
  Filled 2020-04-23 (×3): qty 1

## 2020-04-23 MED ORDER — MIDAZOLAM HCL 5 MG/5ML IJ SOLN
INTRAMUSCULAR | Status: AC
  Filled 2020-04-23: qty 5

## 2020-04-23 MED ORDER — FENTANYL CITRATE (PF) 100 MCG/2ML IJ SOLN
INTRAMUSCULAR | Status: AC
  Filled 2020-04-23: qty 2

## 2020-04-23 MED ORDER — BUTALBITAL-APAP-CAFFEINE 50-325-40 MG PO TABS
1.0000 | ORAL_TABLET | Freq: Four times a day (QID) | ORAL | Status: DC | PRN
  Administered 2020-04-23 – 2020-04-24 (×3): 1 via ORAL
  Filled 2020-04-23 (×3): qty 1

## 2020-04-23 MED ORDER — SODIUM CHLORIDE 0.9 % IV SOLN: 250.0000 mL | INTRAVENOUS | Status: DC | PRN

## 2020-04-23 MED ORDER — SODIUM CHLORIDE 0.9 % IV SOLN: INTRAVENOUS | Status: DC

## 2020-04-23 MED ORDER — HEPARIN (PORCINE) IN NACL 1000-0.9 UT/500ML-% IV SOLN
INTRAVENOUS | Status: DC | PRN
  Administered 2020-04-23 (×2): 500 mL

## 2020-04-23 MED ORDER — SODIUM CHLORIDE 0.9% FLUSH
3.0000 mL | Freq: Two times a day (BID) | INTRAVENOUS | Status: DC
  Administered 2020-04-23 – 2020-04-25 (×4): 3 mL via INTRAVENOUS

## 2020-04-23 MED ORDER — BUPIVACAINE HCL (PF) 0.25 % IJ SOLN
INTRAMUSCULAR | Status: DC | PRN
  Administered 2020-04-23: 60 mL

## 2020-04-23 SURGICAL SUPPLY — 10 items
CATH EZ STEER NAV 4MM D-F CUR (ABLATOR) ×1 IMPLANT
CATH JOSEPH QUAD ALLRED 6F REP (CATHETERS) ×2 IMPLANT
CATH WEBSTER BI DIR CS D-F CRV (CATHETERS) ×1 IMPLANT
PACK EP LATEX FREE (CUSTOM PROCEDURE TRAY) ×2
PACK EP LF (CUSTOM PROCEDURE TRAY) ×1 IMPLANT
PAD PRO RADIOLUCENT 2001M-C (PAD) ×2 IMPLANT
PATCH CARTO3 (PAD) ×2 IMPLANT
SHEATH PINNACLE 6F 10CM (SHEATH) ×1 IMPLANT
SHEATH PINNACLE 7F 10CM (SHEATH) ×1 IMPLANT
SHEATH PINNACLE 8F 10CM (SHEATH) ×1 IMPLANT

## 2020-04-23 NOTE — Progress Notes (Signed)
PROGRESS NOTE        PATIENT DETAILS Name: Ricky [name] Age: 65 y.o. Sex: male Date of Birth: [birth year] Admit Date: 03/26/2020 Admitting Physician No admitting provider for patient encounter. PCP:Pcp, No  Brief Narrative: Patient is a 65 y.o. 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 2/22>> CT chest with contrast: Nodular/cavitary airspace consolidation in the left upper/lower lobes 2/22>> Echo: EF 50-55% 2/24>> CT head: No acute intracranial process 2/24>> nuclear stress test: No ischemia  Antimicrobial therapy: Amphotericin/flucytosine: 2/21>>3/9 Ethambutol: 3/8>> Rifabutin: 3/8>> Zithromax: 3/8>> Fluconazole: 3/9>>  Microbiology data: 2/21>> CSF culture: Cryptococcus neoformans 2/23>> sputum AFB smear negative 2/23>> sputum AFB DNA probe: Positive for M avium complex 2/24>> sputum AFB smear: Negative 2/24>> CSF culture: Cryptococcus neoformans 2/28>> CSF culture: No growth 3/8>> CSF Gram stain: Cryptococcus neoformans 3/8>> CSF culture: No growth  Procedures : LP>> 2/22, 2/24, 2/28, 3/8  Consults: Neurology, cardiology Terri Skains), ID, ophthalmology, EP  DVT Prophylaxis : SCDs Start: 03/26/20 1946   Subjective: Patient in bed, appears comfortable, L.  Retro-orbital headache gradually getting worse, no fever, no chest pain or pressure, no shortness of breath , no abdominal pain. No focal weakness.    Assessment/Plan:  Cryptococcal meningitis: Overall improved-completed a course of IV amphotericin/flucytosine-has been transitioned to fluconazole.  Await final  culture results of LP from 3/8.  Appreciate ID follow-up currently on fluconazole with end date 06/10/2020.  Outpatient follow-up with Dr. Gale Journey ID.  However his retro-orbital headache on the left side is gradually getting worse, will repeat MRI and request neurology to evaluate on 04/23/2020.   HIV/AIDS: Noncompliant-timing for initiation of ART defer to ID.  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  Currently on triple therapy.  Will follow with ID in 2 to 3 weeks post discharge for follow-up post DC.   Azithromycin 54m daily  Rifabutin 3016mdaily  Ethambutol 120032maily  History of tuberculosis-apparently treated at the [facility] 2001  AKI - on CKD stage IIIb baseline creatinine around 1.5, AKI likely due to hypotension night of 04/18/2020 and recent Bactrim exposure.  Improved after Bactrim was discontinued and gentle hydration on 04/19/2020.  SVT: Some sinus pauses, cardiologist Dr. GanEinar Gipase discussed with him, multiple terms of his bradycardia, has been adequately hydrated, currently on Cardizem and beta-blocker combination, cardiology considering ablation treatment this admission.  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  Repeat Samsca on 04/20/2020.    Chronic right eye blurriness: Evaluated by ophthalmology on 3/7-no evidence of infection.  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  Also added Megace on 04/20/2020 due to poor appetite.  History of cocaine use & Tobacco abuse - counseled to quit.  Recent COVID-19 infection: Asymptomatic-out of quarantine window.  Hyperkalemia.  Due to Bactrim, resolved after Lokelma and stoppage of Bactrim.   Lab Results  Component Value Date   SARSCOV2NAA POSITIVE (A) 03/03/2020   Diet: Diet Order            Diet NPO time specified Except for: Sips with  Meds  Diet  effective midnight                  Code Status: Full code  Family Communication: Patient will update his family himself.  Disposition Plan: Status is: Inpatient  Remains inpatient appropriate because:Inpatient level of care appropriate due to severity of illness   Dispo:  Patient From:    Planned Disposition:    Medically stable for discharge:    Barriers to Discharge: Cryptococcal meningitis-awaiting CSF cultures to be final before consideration of discharge-Per discussion with ID on 3/10-potential discharge on 04/18/2020 if sodium levels are better.  Antimicrobial agents: Anti-infectives (From admission, onward)   Start     Dose/Rate Route Frequency Ordered Stop   04/19/20 1000  atovaquone (MEPRON) 750 MG/5ML suspension 1,500 mg        1,500 mg Oral Daily 04/18/20 0941     04/18/20 0000  rifabutin (MYCOBUTIN) 150 MG capsule        300 mg Oral Daily 04/18/20 0935     04/18/20 0000  atovaquone (MEPRON) 750 MG/5ML suspension        1,500 mg Oral Daily 04/18/20 0935     04/17/20 1015  sulfamethoxazole-trimethoprim (BACTRIM) 400-80 MG per tablet 1 tablet  Status:  Discontinued        1 tablet Oral Daily 04/17/20 0923 04/18/20 0941   04/17/20 0000  fluconazole (DIFLUCAN) 200 MG tablet        400 mg Oral Daily 04/17/20 1140 06/10/20 2359   04/17/20 0000  rifabutin (MYCOBUTIN) 150 MG capsule  Status:  Discontinued        300 mg Oral Daily 04/17/20 1140 04/18/20    04/17/20 0000  azithromycin (ZITHROMAX) 500 MG tablet        500 mg Oral Daily 04/17/20 1140     04/17/20 0000  ethambutol (MYAMBUTOL) 400 MG tablet        1,200 mg Oral Daily 04/17/20 1140     04/17/20 0000  sulfamethoxazole-trimethoprim (BACTRIM) 400-80 MG tablet  Status:  Discontinued        1 tablet Oral Daily 04/17/20 1140 04/18/20    04/16/20 1000  fluconazole (DIFLUCAN) tablet 400 mg        400 mg Oral Daily 04/15/20 1741     04/15/20 1015  dapsone tablet 100 mg  Status:  Discontinued        100 mg Oral  Daily 04/15/20 0925 04/17/20 0923   04/11/20 1815  sulfamethoxazole-trimethoprim (BACTRIM DS) 800-160 MG per tablet 1 tablet  Status:  Discontinued        1 tablet Oral Once per day on Mon Wed Fri 04/11/20 1722 04/15/20 0925   04/11/20 1200  fluconazole (DIFLUCAN) tablet 800 mg  Status:  Discontinued        800 mg Oral Daily 04/11/20 1052 04/15/20 1741   04/11/20 1000  azithromycin (ZITHROMAX) tablet 500 mg        500 mg Oral Daily 04/10/20 1038     04/10/20 1130  ethambutol (MYAMBUTOL) tablet 1,200 mg        1,200 mg Oral Daily 04/10/20 1038     04/10/20 1130  rifabutin (MYCOBUTIN) capsule 300 mg        300 mg Oral Daily 04/10/20 1038     03/28/20 1000  azithromycin (ZITHROMAX) tablet 1,200 mg  Status:  Discontinued        1,200 mg Oral Weekly 03/28/20 0912 04/10/20 1038   03/28/20 1000  sulfamethoxazole-trimethoprim (BACTRIM) 400-80 MG  per tablet 1 tablet  Status:  Discontinued        1 tablet Oral Daily 03/28/20 0912 04/02/20 0919   03/27/20 2115  amphotericin B liposome (AMBISOME) 300 mg in dextrose 5 % 500 mL IVPB  Status:  Discontinued        300 mg 287.5 mL/hr over 120 Minutes Intravenous Every 24 hours 03/26/20 2024 03/26/20 2227   03/26/20 2227  amphotericin B liposome (AMBISOME) 300 mg in dextrose 5 % 500 mL IVPB  Status:  Discontinued        300 mg 287.5 mL/hr over 120 Minutes Intravenous Every 24 hours 03/26/20 2227 04/11/20 1052   03/26/20 1945  amphotericin B liposome (AMBISOME) 300 mg in dextrose 5 % 500 mL IVPB  Status:  Discontinued        300 mg 287.5 mL/hr over 120 Minutes Intravenous Every 24 hours 03/26/20 1850 03/26/20 2024   03/26/20 1900  flucytosine (ANCOBON) capsule 2,000 mg  Status:  Discontinued        25 mg/kg  80 kg Oral Every 6 hours 03/26/20 1850 04/11/20 1052       Time spent: 15- minutes-Greater than 50% of this time was spent in counseling, explanation of diagnosis, planning of further management, and coordination of care.  MEDICATIONS: Scheduled  Meds: . atovaquone  1,500 mg Oral Daily  . azithromycin  500 mg Oral Daily  . clobetasol cream   Topical BID  . [START ON 04/24/2020] diltiazem  30 mg Oral Q12H  . ethambutol  1,200 mg Oral Daily  . fluconazole  400 mg Oral Daily  . megestrol  40 mg Oral Daily  . [START ON 04/24/2020] metoprolol tartrate  25 mg Oral BID  . pravastatin  20 mg Oral QPM  . rifabutin  300 mg Oral Daily   Continuous Infusions: . sodium chloride 50 mL/hr at 04/23/20 0830  . amiodarone     PRN Meds:.acetaminophen, butalbital-acetaminophen-caffeine, diltiazem, LORazepam, [DISCONTINUED] ondansetron **OR** ondansetron (ZOFRAN) IV, traMADol   PHYSICAL EXAM: Vital signs: Vitals:   04/22/20 2116 04/22/20 2352 04/23/20 0456 04/23/20 0700  BP: 125/64 117/75 112/79 115/82  Pulse: 99 (!) 51 (!) 51 (!) 52  Resp: _0 Temp: 99.8 F (37.7 C) 98.3 F (36.8 C) 98.2 F (36.8 C) 98.1 F (36.7 C)  TempSrc: Oral Oral Oral Oral  SpO2: 99% 99% 99% 99%  Weight:      Height:       Filed Weights   03/26/20 1839 03/27/20 0141  Weight: 80 kg 80.8 kg   Body mass index is 23.5 kg/m.   Gen Exam:  Awake Alert, No new F.N deficits, Normal affect [place].AT,PERRAL Supple Neck,No JVD, No cervical lymphadenopathy appriciated.  Symmetrical Chest wall movement, Good air movement bilaterally, CTAB RRR,No Gallops, Rubs or new Murmurs, No Parasternal Heave +ve B.Sounds, Abd Soft, No tenderness, No organomegaly appriciated, No rebound - guarding or rigidity. No Cyanosis, Clubbing or edema, No new Rash or bruise   I have personally reviewed following labs and imaging studies  LABORATORY DATA: CBC: Recent Labs  Lab 04/20/20 0357 04/21/20 0130 04/22/20 0323 04/23/20 0500  WBC 5.1 5.4 3.7* 3.0*  NEUTROABS 3.2 3.8 1.8 1.2*  HGB 11.5* 10.8* 10.4* 10.6*  HCT 34.3* 30.8* 31.2* 31.1*  MCV 96.6 95.7 97.8 98.1  PLT 192 211 213 093    Basic Metabolic Panel: Recent Labs  Lab 04/19/20 0439 04/20/20 0357  04/21/20 0130 04/22/20 0323 04/23/20 0500  NA 130* 128* 130* 132*  131*  K 5.3* 4.0 3.7 4.1 4.3  CL 100 98 100 102 106  CO2 _0 20* 20*  GLUCOSE 132* 126* 153* 135* 100*  BUN 30* _1 CREATININE 1.92* 1.31* 1.19 1.12 1.03  CALCIUM 9.0 8.8* 8.7* 8.8* 8.9  MG 1.8 1.6* 1.9 1.8 1.7    GFR: Estimated Creatinine Clearance: 84 mL/min (by C-G formula based on SCr of 1.03 mg/dL).  Liver Function Tests: Recent Labs  Lab 04/17/20 0750  AST 30  ALT 26  ALKPHOS 162*  BILITOT 0.9  PROT 8.0  ALBUMIN 3.1*   No results for input(s): LIPASE, AMYLASE in the last 168 hours. No results for input(s): AMMONIA in the last 168 hours.  Coagulation Profile: No results for input(s): INR, PROTIME in the last 168 hours.  Cardiac Enzymes: No results for input(s): CKTOTAL, CKMB, CKMBINDEX, TROPONINI in the last 168 hours.  BNP (last 3 results) No results for input(s): PROBNP in the last 8760 hours.  Lipid Profile: No results for input(s): CHOL, HDL, LDLCALC, TRIG, CHOLHDL, LDLDIRECT in the last 72 hours.  Thyroid Function Tests: No results for input(s): TSH, T4TOTAL, FREET4, T3FREE, THYROIDAB in the last 72 hours.  Anemia Panel: No results for input(s): VITAMINB12, FOLATE, FERRITIN, TIBC, IRON, RETICCTPCT in the last 72 hours.  Urine analysis:    Component Value Date/Time   COLORURINE YELLOW 04/15/2020 1455   APPEARANCEUR HAZY (A) 04/15/2020 1455   LABSPEC 1.006 04/15/2020 1455   PHURINE 5.0 04/15/2020 1455   GLUCOSEU NEGATIVE 04/15/2020 1455   HGBUR SMALL (A) 04/15/2020 1455   BILIRUBINUR NEGATIVE 04/15/2020 1455   KETONESUR NEGATIVE 04/15/2020 1455   PROTEINUR NEGATIVE 04/15/2020 1455   NITRITE NEGATIVE 04/15/2020 1455   LEUKOCYTESUR LARGE (A) 04/15/2020 1455    Sepsis Labs: Lactic Acid, Venous    Component Value Date/Time   LATICACIDVEN 0.9 03/26/2020 0958    MICROBIOLOGY: No results found for this or any previous visit (from the past 240  hour(s)).  RADIOLOGY STUDIES/RESULTS: No results found.   LOS: 28 days   Lala Lund, MD  [facility]   04/23/2020, 9:48 AM

## 2020-04-23 NOTE — Progress Notes (Signed)
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  Subjective: Patient c/o approximately 2 weeks of left retro orbital headache described as a "brain freeze" behind the left eye with increase in pain with left lateral eye movement. Pain improved with PRN medications, application of pressure to the left eye, and supine positioning. Pain worsened with position changes such as standing from a laying position. The pressure behind the eye has increased over the past week to constant and throbbing. He states the pain usually begins in the morning as a 7/10 and without constant medications it usually increases to a 9-10/10. The pain moves from left retro orbital upward to the left frontal head on standing. He was given Fioricet this morning at 10:02 with resolution of headache after tramadol at 09:09 this morning without relief. Last LP 3/8 with no growth on CSF culture and with opening pressure of 18 cmH2O.   Exam: Vitals:   04/23/20 0456 04/23/20 0700  BP: 112/79 115/82  Pulse: (!) 51 (!) 52  Resp: 16 17  Temp: 98.2 F (36.8 C) 98.1 F (36.7 C)  SpO2: 99% 99%   Gen: Laying in bed, watching television Resp: non-labored breathing, no acute distress Abd: soft, non-distended, non-tender  Neuro: Ment: Awake, alert, and oriented to person, place, time, and situation. Patient is a poor historian often giving complicated history with differing details. His speech is without dysarthria or aphasia. Naming, repetition, and fluency are intact. Poor attention noted. No signs of neglect noted.  CN:  II: PERRL 3 mm/brisk. Visual fields full.  III, IV, VI:  EOMI.  V: Sensation is intact to light touch and symmetrical to face.  VII: Face is symmetric resting and smiling  VIII: Hearing intact to voice. IX, X: Palate elevates symmetrically. Phonation is normal.  XI: Shoulder shrug 5/5. XII: Tongue protrudes midline Motor: 5/5 strength to all muscle groups tested.  Tone: is normal. Bulk is normal. Sensation- Intact and symmetric to light touch in upper and lower extremities.  Coordination: FTN intact bilaterally.  Pertinent Labs: CBC    Component Value Date/Time   WBC 3.0 (L) 04/23/2020 0500   RBC 3.17 (L) 04/23/2020 0500   HGB 10.6 (L) 04/23/2020 0500   HGB 12.9 (L) 04/15/2020 0405   HCT 31.1 (L) 04/23/2020 0500   PLT 236 04/23/2020 0500   MCV 98.1 04/23/2020 0500   MCH 33.4 04/23/2020 0500   MCHC 34.1 04/23/2020 0500   RDW 11.4 (L) 04/23/2020 0500   LYMPHSABS 0.8 04/23/2020 0500   MONOABS 0.5 04/23/2020 0500   EOSABS 0.4 04/23/2020 0500   BASOSABS 0.0 04/23/2020 0500   CMP     Component Value Date/Time   NA 131 (L) 04/23/2020 0500   K 4.3 04/23/2020 0500   CL 106 04/23/2020 0500   CO2 20 (L) 04/23/2020 0500   GLUCOSE 100 (H) 04/23/2020 0500   BUN 21 04/23/2020 0500   CREATININE 1.03 04/23/2020 0500   CALCIUM 8.9 04/23/2020 0500   PROT 8.0 04/17/2020 0750   ALBUMIN 3.1 (L) 04/17/2020 0750   AST 30 04/17/2020 0750   ALT 26 04/17/2020 0750   ALKPHOS 162 (H) 04/17/2020 0750   BILITOT 0.9 04/17/2020 0750   GFRNONAA >60 04/23/2020 0500  CSF from 3/8: WBC PRESENT, PREDOMINANTLY MONONUCLEAR  CORRECTED RESULTS NO ORGANISMS SEEN  PREVIOUSLY REPORTED AS: CRYPTOCOCCUS NEOFORMANS  Opening pressure was 18 cm H2O at this time  Imaging Reviewed: MRI brain 3/21- 1. No acute intracranial abnormality. 2. 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 3. Otherwise unremarkable for age noncontrast MRI appearance of the brain. 4. Mild paranasal  sinus inflammation.  Assessment: 66 year old male with HIV initially admitted with HA found to have CSF with cryptococcal antigen positive. His infection and HIV has been managed by primary team and ID. His HAs have decreased overall. He has had 4 LPs with opening pressures of 27, 26, 14, and 18 respectively. Last LP on 3/8 without resolution of cryptococcus neoformans. Today (3/21) neurology was asked to reassess patient given fluctuating but gradually worsening left retroorbital headache and to determine whether another LP is indicated for further headache management.  - LP opening pressures with downward trend since treatment of cryptococcus neoformans with CSF culture 3/8 showing no growth. Do not feel strongly that LP will improve headache at this time and will cause further risk of dural puncture headache with repeat LP.  - Differential diagnoses include recurrence of cryptococcal meningitis following treatment versus post dural puncture headache. Overall pattern of unilateral headache worsened with standing would be atypical for a headache due to elevated CSF pressure. Improvement in headache in supine position is more consistent with a post dural puncture headache. Rebound headache due to analgesic overuse is also possible.   Recommendations: - Discontinue Fioricet due to increased risk of rebound headaches - Encourage liberal amounts of PO caffeine intake  - Management of SIADH - On assessment, headache today is with resolution after medication. This feature is also atypical for a headache due to elevated CSF pressure or infection.  - Bedrest for 24 hours - Will reassess tomorrow.   Anibal Henderson, AGACNP-BC [facility] [phone]  Electronically signed: Dr. Kerney Elbe

## 2020-04-23 NOTE — Progress Notes (Signed)
Site area: rt groin fv sheaths x3 Site Prior to Removal:  Level 0 Pressure Applied For: 15 minutes Manual:   yes Patient Status During Pull:  stable Post Pull Site:  Level 0 Post Pull Instructions Given:  yes Post Pull Pulses Present: rt dp palpable Dressing Applied:  Gauze and tegaderm Bedrest begins @ 1850 Comments: IV saline locked

## 2020-04-23 NOTE — Progress Notes (Signed)
Pt CHG bath and bilateral groin and right neck clipped. Reported off to oncoming RN. Delia Heady RN

## 2020-04-23 NOTE — Progress Notes (Addendum)
Electrophysiology Rounding Note  Patient Name: Ricky [name] Date of Encounter: 04/24/20   Primary Cardiologist: No primary care provider on file. Electrophysiologist: Dr. Lovena Le   Subjective   The patient is doing well today.  At this time, the patient denies chest pain, shortness of breath, or any new concerns.  SVT ablation 04/23/20 with 1. Sinus rhythm upon presentation.  2. The patient had a concealed right para-hisian AP and catheter manipulation around the pathway resulted in bumping the pathway and rendered the pathway non-functional such that additional mapping and ablation could not be carried out. 3. No inducible arrhythmias following catheter manipulation.  5. No early apparent complications.    Inpatient Medications    Scheduled Meds:  atovaquone  1,500 mg Oral Daily   azithromycin  500 mg Oral Daily   clobetasol cream   Topical BID   diltiazem  30 mg Oral Q12H   ethambutol  1,200 mg Oral Daily   fluconazole  400 mg Oral Daily   megestrol  40 mg Oral Daily   metoprolol tartrate  25 mg Oral BID   pravastatin  20 mg Oral QPM   rifabutin  300 mg Oral Daily   sodium chloride flush  3 mL Intravenous Q12H   Continuous Infusions:  sodium chloride 50 mL/hr at 04/24/20 0404   sodium chloride     magnesium sulfate bolus IVPB     PRN Meds: sodium chloride, acetaminophen, acetaminophen, butalbital-acetaminophen-caffeine, diltiazem, ondansetron (ZOFRAN) IV, sodium chloride flush, traMADol   Vital Signs    Vitals:   04/23/20 1850 04/23/20 2000 04/24/20 0000 04/24/20 0447  BP: (!) 143/81 (!) 130/97 (!) 124/93 116/77  Pulse:  (!) 59 67 63  Resp: 16 18 20  (!) 21  Temp:  97.6 F (36.4 C) 98.2 F (36.8 C) 98.3 F (36.8 C)  TempSrc:  Oral Oral Oral  SpO2:  99% 99% 99%  Weight:    73.5 kg  Height:    6\' 1"  (1.854 m)    Intake/Output Summary (Last 24 hours) at 04/24/2020 0742 Last data filed at 04/24/2020 0447 Gross per 24 hour  Intake 986.14 ml  Output 600 ml   Net 386.14 ml   Filed Weights   03/26/20 1839 03/27/20 0141 04/24/20 0447  Weight: 80 kg 80.8 kg 73.5 kg    Physical Exam    GEN- The patient is well appearing, alert and oriented x 3 today.   Head- normocephalic, atraumatic Eyes-  Sclera clear, conjunctiva pink Ears- hearing intact Oropharynx- clear Neck- supple Lungs- Clear to ausculation bilaterally, normal work of breathing Heart- Regular rate and rhythm, no murmurs, rubs or gallops GI- soft, NT, ND, + BS Extremities- no clubbing or cyanosis. No edema Skin- no rash or lesion Psych- euthymic mood, full affect Neuro- strength and sensation are intact  Labs    CBC Recent Labs    04/23/20 0500 04/24/20 0405  WBC 3.0* 2.7*  NEUTROABS 1.2* 1.3*  HGB 10.6* 10.3*  HCT 31.1* 30.3*  MCV 98.1 96.8  PLT 236 557   Basic Metabolic Panel Recent Labs    04/23/20 0500 04/24/20 0405  NA 131* 134*  K 4.3 4.4  CL 106 108  CO2 20* 20*  GLUCOSE 100* 136*  BUN 21 22  CREATININE 1.03 1.11  CALCIUM 8.9 9.0  MG 1.7 1.6*   Liver Function Tests No results for input(s): AST, ALT, ALKPHOS, BILITOT, PROT, ALBUMIN in the last 72 hours. No results for input(s): LIPASE, AMYLASE in the last 72  hours. Cardiac Enzymes No results for input(s): CKTOTAL, CKMB, CKMBINDEX, TROPONINI in the last 72 hours.   Telemetry    NSR 60-70s, short SVT overnight (personally reviewed)  Radiology    MR BRAIN WO CONTRAST  Result Date: 04/23/2020 CLINICAL DATA:  65 year old male with history of HIV, non compliant on therapy. Headache, dizziness. Status post lumbar puncture on 04/10/2020 with CSF analysis suspicious for cryptococcal meningitis. EXAM: MRI HEAD WITHOUT CONTRAST TECHNIQUE: Multiplanar, multiecho pulse sequences of the brain and surrounding structures were obtained without intravenous contrast. COMPARISON:  Brain MRI 03/26/2020.  Head CT 03/29/2020. FINDINGS: Study is mildly degraded by motion artifact despite repeated imaging attempts.  Brain: No restricted diffusion to suggest acute infarction. No midline shift, evidence of mass lesion, ventriculomegaly, or acute intracranial hemorrhage. Cervicomedullary junction and pituitary are within normal limits. 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 These are 2-3 mm in thickness, minimally apparent by CT. No other extra-axial collection identified. No other chronic cerebral blood products identified. Underlying gray and white matter signal is stable and within normal limits for age. Vascular: Major intracranial vascular flow voids are stable since last month. Skull and upper cervical spine: Stable, negative. Sinuses/Orbits: Negative orbits. Mild paranasal sinus mucosal thickening has not significantly changed. There is trace new layering fluid in the maxillary sinuses. Other: Mastoids remain clear. Visible internal auditory structures appear normal. Negative visible scalp and face soft tissues. IMPRESSION: 1. No acute intracranial abnormality. 2. 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 3. Otherwise unremarkable for age noncontrast MRI appearance of the brain. 4. Mild paranasal sinus inflammation. Electronically Signed   By: Genevie Ann M.D.   On: 04/23/2020 11:59   EP STUDY  Result Date: 04/23/2020 CONCLUSIONS: 1. Sinus rhythm upon presentation. 2. The patient had a concealed right para-hisian AP and catheter manipulation around the pathway resulted in bumping the pathway and rendered the pathway non-functional such that additional mapping and ablation could not be carried out. 3. No inducible arrhythmias following catheter manipulation. 5. No early apparent complications. Salome Spotted 04/23/2020 6:08 PM    Patient Profile     Ricky [name] is a 65 y.o. male with a past medical history significant for SVT and AVWB.    Assessment & Plan    1. SVT S/p ablation attempt yesterday that showed: 1. Sinus rhythm upon presentation.  2. The patient had a concealed right para-hisian AP and catheter manipulation around the pathway resulted in bumping the pathway and rendered the pathway non-functional such that additional mapping and ablation could not be carried out. 3. No inducible arrhythmias following catheter manipulation.  5. No early apparent complications.   His pathway is directly adjacent to his AV node, so repeat attempts at ablation would very likely involve pacing. Will follow clinically for now.   2. AVWB Minimal asymptomatic. He is on lopressor and diltiazem.  For questions or updates, please contact [place] Please consult [email or web address] for contact info under Cardiology/STEMI.  Signed, Shirley Friar, PA-C  04/24/2020, 7:42 AM   EP Attending  Patient seen and examined. Agree with above. The patient had a brief episode of SVT, but remains stable. His SVT is located next to the AV node and self terminated after bumping the pathway. I have recommended continued beta blocker but no  calcium blocker. He can be discharged from my perspective. I will see him back for arrhythmia followup in 4-6 weeks.  Carleene Overlie Parish [place]ose,MD

## 2020-04-23 NOTE — Interval H&P Note (Signed)
History and Physical Interval Note:  04/23/2020 4:09 PM  [name]  has presented today for surgery, with the diagnosis of SVT.  The various methods of treatment have been discussed with the patient and family. After consideration of risks, benefits and other options for treatment, the patient has consented to  Procedure(s): SVT ABLATION (N/A) as a surgical intervention.  The patient's history has been reviewed, patient examined, no change in status, stable for surgery.  I have reviewed the patient's chart and labs.  Questions were answered to the patient's satisfaction.     Ricky [name]

## 2020-04-23 NOTE — Progress Notes (Addendum)
Electrophysiology Rounding Note  Patient Name: Ricky [name] Date of Encounter: 04/23/2020  Primary Cardiologist: No primary care provider on file. Electrophysiologist: New   Subjective   The patient is doing well today.  At this time, the patient denies chest pain, shortness of breath, or any new concerns.  Inpatient Medications    Scheduled Meds:  atovaquone  1,500 mg Oral Daily   azithromycin  500 mg Oral Daily   clobetasol cream   Topical BID   diltiazem  30 mg Oral Q12H   ethambutol  1,200 mg Oral Daily   fluconazole  400 mg Oral Daily   megestrol  40 mg Oral Daily   metoprolol tartrate  25 mg Oral BID   pravastatin  20 mg Oral QPM   rifabutin  300 mg Oral Daily   Continuous Infusions:  sodium chloride 50 mL/hr at 04/23/20 0830   amiodarone     PRN Meds: acetaminophen, diltiazem, [DISCONTINUED] ondansetron **OR** ondansetron (ZOFRAN) IV, traMADol   Vital Signs    Vitals:   04/22/20 2116 04/22/20 2352 04/23/20 0456 04/23/20 0700  BP: 125/64 117/75 112/79 115/82  Pulse: 99 (!) 51 (!) 51 (!) 52  Resp: 19 16 16 17   Temp: 99.8 F (37.7 C) 98.3 F (36.8 C) 98.2 F (36.8 C) 98.1 F (36.7 C)  TempSrc: Oral Oral Oral Oral  SpO2: 99% 99% 99% 99%  Weight:      Height:        Intake/Output Summary (Last 24 hours) at 04/23/2020 0902 Last data filed at 04/22/2020 1034 Gross per 24 hour  Intake --  Output 275 ml  Net -275 ml   Filed Weights   03/26/20 1839 03/27/20 0141  Weight: 80 kg 80.8 kg    Physical Exam    GEN- The patient is well appearing, alert and oriented x 3 today.   Head- normocephalic, atraumatic Eyes-  Sclera clear, conjunctiva pink Ears- hearing intact Oropharynx- clear Neck- supple Lungs- Clear to ausculation bilaterally, normal work of breathing Heart- Regular rate and rhythm, no murmurs, rubs or gallops GI- soft, NT, ND, + BS Extremities- no clubbing or cyanosis. No edema Skin- no rash or lesion Psych- euthymic mood, full  affect Neuro- strength and sensation are intact  Labs    CBC Recent Labs    04/22/20 0323 04/23/20 0500  WBC 3.7* 3.0*  NEUTROABS 1.8 1.2*  HGB 10.4* 10.6*  HCT 31.2* 31.1*  MCV 97.8 98.1  PLT 213 831   Basic Metabolic Panel Recent Labs    04/22/20 0323 04/23/20 0500  NA 132* 131*  K 4.1 4.3  CL 102 106  CO2 20* 20*  GLUCOSE 135* 100*  BUN 22 21  CREATININE 1.12 1.03  CALCIUM 8.8* 8.9  MG 1.8 1.7   Liver Function Tests No results for input(s): AST, ALT, ALKPHOS, BILITOT, PROT, ALBUMIN in the last 72 hours. No results for input(s): LIPASE, AMYLASE in the last 72 hours. Cardiac Enzymes No results for input(s): CKTOTAL, CKMB, CKMBINDEX, TROPONINI in the last 72 hours.   Telemetry    NSR 50-60s (personally reviewed)  Radiology    No results found.  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 SVT - -Dr. Lovena Le has previously discussed the indications/risks/benefits/goals/expectations of EP study and catheter ablation and he would like to proceed. Pt has no questions this am.  2. AVWB -  Minimal, asymptomatic.  He is ordered for lopressor and diltiazem. Held this am pending SVT ablation.   For questions or updates, please contact [place] Please consult [email or web address] for contact info under Cardiology/STEMI.  Signed, Shirley Friar, PA-C  04/23/2020, 9:02 AM   EP Attending  Patient seen and examined. Agree with the findings as noted above. The patient had a HA this morning and has undergone MRI scanning. He will undergo EP study and catheter ablation.  Carleene Overlie Ari Bernabei,MD

## 2020-04-23 NOTE — Progress Notes (Signed)
Telemetry called to report pt having 2.17secs pause. Pt asymptomatic and sleeping comfortably in bed with call light with in reach. Will continue to monitor closely monitor. Delia Heady RN

## 2020-04-24 ENCOUNTER — Encounter (HOSPITAL_COMMUNITY): Payer: Self-pay | Admitting: Internal Medicine

## 2020-04-24 ENCOUNTER — Inpatient Hospital Stay: Payer: Medicaid Other | Admitting: Internal Medicine

## 2020-04-24 LAB — CBC WITH DIFFERENTIAL/PLATELET
Abs Immature Granulocytes: 0.05 10*3/uL (ref 0.00–0.07)
Basophils Absolute: 0 10*3/uL (ref 0.0–0.1)
Basophils Relative: 1 %
Eosinophils Absolute: 0.3 10*3/uL (ref 0.0–0.5)
Eosinophils Relative: 13 %
HCT: 30.3 % — ABNORMAL LOW (ref 39.0–52.0)
Hemoglobin: 10.3 g/dL — ABNORMAL LOW (ref 13.0–17.0)
Immature Granulocytes: 2 %
Lymphocytes Relative: 21 %
Lymphs Abs: 0.6 10*3/uL — ABNORMAL LOW (ref 0.7–4.0)
MCH: 32.9 pg (ref 26.0–34.0)
MCHC: 34 g/dL (ref 30.0–36.0)
MCV: 96.8 fL (ref 80.0–100.0)
Monocytes Absolute: 0.4 10*3/uL (ref 0.1–1.0)
Monocytes Relative: 14 %
Neutro Abs: 1.3 10*3/uL — ABNORMAL LOW (ref 1.7–7.7)
Neutrophils Relative %: 49 %
Platelets: 232 10*3/uL (ref 150–400)
RBC: 3.13 MIL/uL — ABNORMAL LOW (ref 4.22–5.81)
RDW: 11.6 % (ref 11.5–15.5)
WBC: 2.7 10*3/uL — ABNORMAL LOW (ref 4.0–10.5)
nRBC: 0 % (ref 0.0–0.2)

## 2020-04-24 LAB — BASIC METABOLIC PANEL
Anion gap: 6 (ref 5–15)
BUN: 22 mg/dL (ref 8–23)
CO2: 20 mmol/L — ABNORMAL LOW (ref 22–32)
Calcium: 9 mg/dL (ref 8.9–10.3)
Chloride: 108 mmol/L (ref 98–111)
Creatinine, Ser: 1.11 mg/dL (ref 0.61–1.24)
GFR, Estimated: >60 mL/min (ref >60)
Glucose, Bld: 136 mg/dL — ABNORMAL HIGH (ref 70–99)
Potassium: 4.4 mmol/L (ref 3.5–5.1)
Sodium: 134 mmol/L — ABNORMAL LOW (ref 135–145)

## 2020-04-24 LAB — MAGNESIUM: Magnesium: 1.6 mg/dL — ABNORMAL LOW (ref 1.7–2.4)

## 2020-04-24 MED ORDER — ACETAMINOPHEN 325 MG PO TABS: 650.0000 mg | ORAL_TABLET | ORAL | Status: DC | PRN

## 2020-04-24 MED ORDER — MAGNESIUM SULFATE 2 GM/50ML IV SOLN
2.0000 g | Freq: Once | INTRAVENOUS | Status: AC
  Administered 2020-04-24: 2 g via INTRAVENOUS
  Filled 2020-04-24: qty 50

## 2020-04-24 MED ORDER — KETOROLAC TROMETHAMINE 30 MG/ML IJ SOLN
30.0000 mg | Freq: Three times a day (TID) | INTRAMUSCULAR | Status: DC | PRN
  Administered 2020-04-24 (×2): 30 mg via INTRAVENOUS
  Filled 2020-04-24 (×2): qty 1

## 2020-04-24 NOTE — Progress Notes (Signed)
PROGRESS NOTE        PATIENT DETAILS Name: Ricky [name] Age: 65 y.o. Sex: male Date of Birth: July 04, 1955 Admit Date: 03/26/2020 Admitting Physician Thurnell Lose, MD PCP:Pcp, No  Brief Narrative: Patient is a 65 y.o. 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 2/22>> CT chest with contrast: Nodular/cavitary airspace consolidation in the left upper/lower lobes 2/22>> Echo: EF 50-55% 2/24>> CT head: No acute intracranial process 2/24>> nuclear stress test: No ischemia  Antimicrobial therapy: Amphotericin/flucytosine: 2/21>>3/9 Ethambutol: 3/8>> Rifabutin: 3/8>> Zithromax: 3/8>> Fluconazole: 3/9>>  Microbiology data: 2/21>> CSF culture: Cryptococcus neoformans 2/23>> sputum AFB smear negative 2/23>> sputum AFB DNA probe: Positive for M avium complex 2/24>> sputum AFB smear: Negative 2/24>> CSF culture: Cryptococcus neoformans 2/28>> CSF culture: No growth 3/8>> CSF Gram stain: Cryptococcus neoformans 3/8>> CSF culture: No growth  Procedures : LP>> 2/22, 2/24, 2/28, 3/8  Consults: Neurology, cardiology Terri Skains), ID, ophthalmology, EP  DVT Prophylaxis : SCDs Start: 04/23/20 1919 SCDs Start: 03/26/20 1946   Subjective: Patient sitting in bed eating denies abdominal pain, no shortness of breath does have some left anterolateral line discomfort ongoing for several weeks    Assessment/Plan:  Cryptococcal meningitis: Overall improved-completed a course of IV amphotericin/flucytosine-has been transitioned to fluconazole.  Await final culture results of LP from 3/8.   Appreciate ID follow-up currently on fluconazole with end date 06/10/2020.  Outpatient follow-up with Dr. Gale Journey ID.  However his retro-orbital headache on the left side is gradually getting worse, MRI repeat stable, Neuro on board, IV Toradol x 2 days, defer further care of this problem to Neuro, has been seen by Ophthalmology as well.   HIV/AIDS: Noncompliant-timing for initiation of ART defer to ID.  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  Currently on triple therapy.  Will follow with ID in 2 to 3 weeks post discharge for follow-up post DC.  Note these medications are already sent to [facility] and will be ready on his discharge.   Azithromycin 516m daily  Rifabutin 3035mdaily  Ethambutol 120053maily  History of tuberculosis-apparently treated at the [facility] April 2001  SVT: Some sinus pauses, cardiologist Dr. GanEinar Gipase discussed with him, multiple terms of his bradycardia, has been adequately hydrated, currently on Cardizem and beta-blocker combination, since he was seen by EP and was taken for ablation on 04/23/2020 however this procedure failed due to his anatomy, they want to monitor her closely on nodal blocking agents if SVT recurs he might need complete ablation of the AV node with pacemaker placement.  AKI - on CKD stage IIIb baseline creatinine around 1.5, AKI likely due to hypotension night of 04/18/2020 and recent Bactrim exposure.  Improved after Bactrim was discontinued and gentle hydration on 04/19/2020.  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  Repeat Samsca on 04/20/2020.    Hyperkalemia.  Due to Bactrim, resolved after Lokelma and stoppage of Bactrim.  Hypomagnesemia - replaced.  Chronic right eye blurriness: Evaluated by ophthalmology on 3/7-no evidence of infection.  Chronic calcific pancreatitis: Seen incidentally on CT chest  Moderate protein calorie  malnutrition - protein  supplementation.  Also added Megace on 04/20/2020 due to poor appetite.  History of cocaine use & Tobacco abuse - counseled to quit.  Recent COVID-19 infection: Asymptomatic-out of quarantine window.    Lab Results  Component Value Date   SARSCOV2NAA POSITIVE (A) 03/03/2020   Diet: Diet Order            Diet Heart Room service appropriate? Yes; Fluid consistency: Thin  Diet effective now                  Code Status: Full code  Family Communication: Patient will update his family himself.  Disposition Plan: Status is: Inpatient  Remains inpatient appropriate because:Inpatient level of care appropriate due to severity of illness   Dispo:  Patient From:    Planned Disposition:    Medically stable for discharge:    Barriers to Discharge: Cryptococcal meningitis-awaiting CSF cultures to be final before consideration of discharge-Per discussion with ID on 3/10-potential discharge on 04/18/2020 if sodium levels are better.  Antimicrobial agents: Anti-infectives (From admission, onward)   Start     Dose/Rate Route Frequency Ordered Stop   04/19/20 1000  atovaquone (MEPRON) 750 MG/5ML suspension 1,500 mg        1,500 mg Oral Daily 04/18/20 0941     04/18/20 0000  rifabutin (MYCOBUTIN) 150 MG capsule        300 mg Oral Daily 04/18/20 0935     04/18/20 0000  atovaquone (MEPRON) 750 MG/5ML suspension        1,500 mg Oral Daily 04/18/20 0935     04/17/20 1015  sulfamethoxazole-trimethoprim (BACTRIM) 400-80 MG per tablet 1 tablet  Status:  Discontinued        1 tablet Oral Daily 04/17/20 0923 04/18/20 0941   04/17/20 0000  fluconazole (DIFLUCAN) 200 MG tablet        400 mg Oral Daily 04/17/20 1140 06/10/20 2359   04/17/20 0000  rifabutin (MYCOBUTIN) 150 MG capsule  Status:  Discontinued        300 mg Oral Daily 04/17/20 1140 04/18/20    04/17/20 0000  azithromycin (ZITHROMAX) 500 MG tablet        500 mg Oral Daily 04/17/20 1140     04/17/20 0000  ethambutol (MYAMBUTOL) 400  MG tablet        1,200 mg Oral Daily 04/17/20 1140     04/17/20 0000  sulfamethoxazole-trimethoprim (BACTRIM) 400-80 MG tablet  Status:  Discontinued        1 tablet Oral Daily 04/17/20 1140 04/18/20    04/16/20 1000  fluconazole (DIFLUCAN) tablet 400 mg        400 mg Oral Daily 04/15/20 1741     04/15/20 1015  dapsone tablet 100 mg  Status:  Discontinued        100 mg Oral Daily 04/15/20 0925 04/17/20 0923   04/11/20 1815  sulfamethoxazole-trimethoprim (BACTRIM DS) 800-160 MG per tablet 1 tablet  Status:  Discontinued        1 tablet Oral Once per day on Mon Wed Fri 04/11/20 1722 04/15/20 0925   04/11/20 1200  fluconazole (DIFLUCAN) tablet 800 mg  Status:  Discontinued        800 mg Oral Daily 04/11/20 1052 04/15/20 1741   04/11/20 1000  azithromycin (ZITHROMAX) tablet 500 mg        500 mg Oral Daily 04/10/20 1038     04/10/20 1130  ethambutol (MYAMBUTOL) tablet 1,200 mg  1,200 mg Oral Daily 04/10/20 1038     04/10/20 1130  rifabutin (MYCOBUTIN) capsule 300 mg        300 mg Oral Daily 04/10/20 1038     03/28/20 1000  azithromycin (ZITHROMAX) tablet 1,200 mg  Status:  Discontinued        1,200 mg Oral Weekly 03/28/20 0912 04/10/20 1038   03/28/20 1000  sulfamethoxazole-trimethoprim (BACTRIM) 400-80 MG per tablet 1 tablet  Status:  Discontinued        1 tablet Oral Daily 03/28/20 0912 04/02/20 0919   03/27/20 2115  amphotericin B liposome (AMBISOME) 300 mg in dextrose 5 % 500 mL IVPB  Status:  Discontinued        300 mg 287.5 mL/hr over 120 Minutes Intravenous Every 24 hours 03/26/20 2024 03/26/20 2227   03/26/20 2227  amphotericin B liposome (AMBISOME) 300 mg in dextrose 5 % 500 mL IVPB  Status:  Discontinued        300 mg 287.5 mL/hr over 120 Minutes Intravenous Every 24 hours 03/26/20 2227 04/11/20 1052   03/26/20 1945  amphotericin B liposome (AMBISOME) 300 mg in dextrose 5 % 500 mL IVPB  Status:  Discontinued        300 mg 287.5 mL/hr over 120 Minutes Intravenous Every 24  hours 03/26/20 1850 03/26/20 2024   03/26/20 1900  flucytosine (ANCOBON) capsule 2,000 mg  Status:  Discontinued        25 mg/kg  80 kg Oral Every 6 hours 03/26/20 1850 04/11/20 1052       Time spent: 15- minutes-Greater than 50% of this time was spent in counseling, explanation of diagnosis, planning of further management, and coordination of care.  MEDICATIONS: Scheduled Meds: . atovaquone  1,500 mg Oral Daily  . azithromycin  500 mg Oral Daily  . clobetasol cream   Topical BID  . diltiazem  30 mg Oral Q12H  . ethambutol  1,200 mg Oral Daily  . fluconazole  400 mg Oral Daily  . megestrol  40 mg Oral Daily  . metoprolol tartrate  25 mg Oral BID  . pravastatin  20 mg Oral QPM  . rifabutin  300 mg Oral Daily  . sodium chloride flush  3 mL Intravenous Q12H   Continuous Infusions:  PRN Meds:.acetaminophen, diltiazem, ketorolac, ondansetron (ZOFRAN) IV, traMADol   PHYSICAL EXAM: Vital signs: Vitals:   04/23/20 2000 04/24/20 0000 04/24/20 0447 04/24/20 0752  BP: (!) 130/97 (!) 124/93 116/77 102/74  Pulse: (!) 59 67 63 64  Resp: 18 20 (!) 21 15  Temp: 97.6 F (36.4 C) 98.2 F (36.8 C) 98.3 F (36.8 C) 98 F (36.7 C)  TempSrc: Oral Oral Oral Oral  SpO2: 99% 99% 99% 98%  Weight:   73.5 kg   Height:   _0  (1.854 m)    Filed Weights   03/26/20 1839 03/27/20 0141 04/24/20 0447  Weight: 80 kg 80.8 kg 73.5 kg   Body mass index is 21.39 kg/m.   Gen Exam:  Awake Alert, No new F.N deficits, Normal affect .AT,PERRAL Supple Neck,No JVD, No cervical lymphadenopathy appriciated.  Symmetrical Chest wall movement, Good air movement bilaterally, CTAB RRR,No Gallops, Rubs or new Murmurs, No Parasternal Heave +ve B.Sounds, Abd Soft, No tenderness, No organomegaly appriciated, No rebound - guarding or rigidity. No Cyanosis, Clubbing or edema, No new Rash or bruise  I have personally reviewed following labs and imaging studies  LABORATORY DATA: CBC: Recent Labs  Lab  04/20/20 0357 04/21/20 0130 04/22/20  2094 04/23/20 0500 04/24/20 0405  WBC 5.1 5.4 3.7* 3.0* 2.7*  NEUTROABS 3.2 3.8 1.8 1.2* 1.3*  HGB 11.5* 10.8* 10.4* 10.6* 10.3*  HCT 34.3* 30.8* 31.2* 31.1* 30.3*  MCV 96.6 95.7 97.8 98.1 96.8  PLT 192 211 213 236 709    Basic Metabolic Panel: Recent Labs  Lab 04/20/20 0357 04/21/20 0130 04/22/20 0323 04/23/20 0500 04/24/20 0405  NA 128* 130* 132* 131* 134*  K 4.0 3.7 4.1 4.3 4.4  CL 98 100 102 106 108  CO2 24 22 20* 20* 20*  GLUCOSE 126* 153* 135* 100* 136*  BUN _0 CREATININE 1.31* 1.19 1.12 1.03 1.11  CALCIUM 8.8* 8.7* 8.8* 8.9 9.0  MG 1.6* 1.9 1.8 1.7 1.6*    GFR: Estimated Creatinine Clearance: 71.7 mL/min (by C-G formula based on SCr of 1.11 mg/dL).  Liver Function Tests: No results for input(s): AST, ALT, ALKPHOS, BILITOT, PROT, ALBUMIN in the last 168 hours. No results for input(s): LIPASE, AMYLASE in the last 168 hours. No results for input(s): AMMONIA in the last 168 hours.  Coagulation Profile: No results for input(s): INR, PROTIME in the last 168 hours.  Cardiac Enzymes: No results for input(s): CKTOTAL, CKMB, CKMBINDEX, TROPONINI in the last 168 hours.  BNP (last 3 results) No results for input(s): PROBNP in the last 8760 hours.  Lipid Profile: No results for input(s): CHOL, HDL, LDLCALC, TRIG, CHOLHDL, LDLDIRECT in the last 72 hours.  Thyroid Function Tests: No results for input(s): TSH, T4TOTAL, FREET4, T3FREE, THYROIDAB in the last 72 hours.  Anemia Panel: No results for input(s): VITAMINB12, FOLATE, FERRITIN, TIBC, IRON, RETICCTPCT in the last 72 hours.  Urine analysis:    Component Value Date/Time   COLORURINE YELLOW 04/15/2020 1455   APPEARANCEUR HAZY (A) 04/15/2020 1455   LABSPEC 1.006 04/15/2020 1455   PHURINE 5.0 04/15/2020 1455   GLUCOSEU NEGATIVE 04/15/2020 1455   HGBUR SMALL (A) 04/15/2020 1455   BILIRUBINUR NEGATIVE 04/15/2020 1455   KETONESUR NEGATIVE 04/15/2020 1455    PROTEINUR NEGATIVE 04/15/2020 1455   NITRITE NEGATIVE 04/15/2020 1455   LEUKOCYTESUR LARGE (A) 04/15/2020 1455    Sepsis Labs: Lactic Acid, Venous    Component Value Date/Time   LATICACIDVEN 0.9 03/26/2020 0958    MICROBIOLOGY: No results found for this or any previous visit (from the past 240 hour(s)).  RADIOLOGY STUDIES/RESULTS: MR BRAIN WO CONTRAST  Result Date: 04/23/2020 CLINICAL DATA:  65 year old male with history of HIV, non compliant on therapy. Headache, dizziness. Status post lumbar puncture on 04/10/2020 with CSF analysis suspicious for cryptococcal meningitis. EXAM: MRI HEAD WITHOUT CONTRAST TECHNIQUE: Multiplanar, multiecho pulse sequences of the brain and surrounding structures were obtained without intravenous contrast. COMPARISON:  Brain MRI 03/26/2020.  Head CT 03/29/2020. FINDINGS: Study is mildly degraded by motion artifact despite repeated imaging attempts. Brain: No restricted diffusion to suggest acute infarction. No midline shift, evidence of mass lesion, ventriculomegaly, or acute intracranial hemorrhage. Cervicomedullary junction and pituitary are within normal limits. 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 These are 2-3 mm in thickness, minimally apparent by CT. No other extra-axial collection identified. No other chronic cerebral blood products identified. Underlying gray and white matter signal is stable and within normal limits for age. Vascular: Major intracranial vascular flow voids are stable since last month. Skull and upper cervical spine: Stable, negative. Sinuses/Orbits: Negative  orbits. Mild paranasal sinus mucosal thickening has not significantly changed. There is trace new layering fluid in the maxillary sinuses. Other: Mastoids remain clear. Visible internal auditory structures appear  normal. Negative visible scalp and face soft tissues. IMPRESSION: 1. No acute intracranial abnormality. 2. 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 3. Otherwise unremarkable for age noncontrast MRI appearance of the brain. 4. Mild paranasal sinus inflammation. Electronically Signed   By: Genevie Ann M.D.   On: 04/23/2020 11:59   EP STUDY  Result Date: 04/23/2020 CONCLUSIONS: 1. Sinus rhythm upon presentation. 2. The patient had a concealed right para-hisian AP and catheter manipulation around the pathway resulted in bumping the pathway and rendered the pathway non-functional such that additional mapping and ablation could not be carried out. 3. No inducible arrhythmias following catheter manipulation. 5. No early apparent complications. Carleene Overlie Taylor,MD 04/23/2020 6:08 PM     LOS: 29 days   Lala Lund, MD  [facility]   04/24/2020, 10:25 AM

## 2020-04-25 ENCOUNTER — Other Ambulatory Visit: Payer: Self-pay | Admitting: Internal Medicine

## 2020-04-25 ENCOUNTER — Other Ambulatory Visit: Payer: Self-pay

## 2020-04-25 DIAGNOSIS — B451 Cerebral cryptococcosis: Secondary | ICD-10-CM

## 2020-04-25 DIAGNOSIS — B20 Human immunodeficiency virus [HIV] disease: Secondary | ICD-10-CM

## 2020-04-25 LAB — BASIC METABOLIC PANEL
Anion gap: 6 (ref 5–15)
BUN: 31 mg/dL — ABNORMAL HIGH (ref 8–23)
CO2: 18 mmol/L — ABNORMAL LOW (ref 22–32)
Calcium: 8.8 mg/dL — ABNORMAL LOW (ref 8.9–10.3)
Chloride: 107 mmol/L (ref 98–111)
Creatinine, Ser: 1.27 mg/dL — ABNORMAL HIGH (ref 0.61–1.24)
GFR, Estimated: >60 mL/min (ref >60)
Glucose, Bld: 93 mg/dL (ref 70–99)
Potassium: 4.6 mmol/L (ref 3.5–5.1)
Sodium: 131 mmol/L — ABNORMAL LOW (ref 135–145)

## 2020-04-25 LAB — MAGNESIUM: Magnesium: 1.9 mg/dL (ref 1.7–2.4)

## 2020-04-25 LAB — FUNGAL ORGANISM REFLEX

## 2020-04-25 LAB — FUNGUS CULTURE WITH STAIN

## 2020-04-25 LAB — CBC WITH DIFFERENTIAL/PLATELET
Abs Immature Granulocytes: 0.06 10*3/uL (ref 0.00–0.07)
Basophils Absolute: 0 10*3/uL (ref 0.0–0.1)
Basophils Relative: 0 %
Eosinophils Absolute: 0.4 10*3/uL (ref 0.0–0.5)
Eosinophils Relative: 10 %
HCT: 28.7 % — ABNORMAL LOW (ref 39.0–52.0)
Hemoglobin: 9.8 g/dL — ABNORMAL LOW (ref 13.0–17.0)
Immature Granulocytes: 1 %
Lymphocytes Relative: 16 %
Lymphs Abs: 0.7 10*3/uL (ref 0.7–4.0)
MCH: 33 pg (ref 26.0–34.0)
MCHC: 34.1 g/dL (ref 30.0–36.0)
MCV: 96.6 fL (ref 80.0–100.0)
Monocytes Absolute: 0.5 10*3/uL (ref 0.1–1.0)
Monocytes Relative: 11 %
Neutro Abs: 2.6 10*3/uL (ref 1.7–7.7)
Neutrophils Relative %: 62 %
Platelets: 240 10*3/uL (ref 150–400)
RBC: 2.97 MIL/uL — ABNORMAL LOW (ref 4.22–5.81)
RDW: 11.4 % — ABNORMAL LOW (ref 11.5–15.5)
WBC: 4.3 10*3/uL (ref 4.0–10.5)
nRBC: 0 % (ref 0.0–0.2)

## 2020-04-25 LAB — FUNGUS CULTURE RESULT

## 2020-04-25 MED ORDER — METOPROLOL TARTRATE 25 MG PO TABS: 25.0000 mg | ORAL_TABLET | Freq: Two times a day (BID) | ORAL | 0 refills | Status: DC

## 2020-04-25 MED ORDER — ACETAMINOPHEN 325 MG PO TABS: 650.0000 mg | ORAL_TABLET | ORAL | Status: DC | PRN

## 2020-04-25 MED ORDER — PRAVASTATIN SODIUM 20 MG PO TABS: 20.0000 mg | ORAL_TABLET | Freq: Every evening | ORAL | 0 refills | Status: DC

## 2020-04-25 MED FILL — METOPROLOL TARTRATE 25 MG T: 25 | 30 days supply | Qty: 60 | Fill #0

## 2020-04-25 MED FILL — PRAVASTATIN NA 20 MG TAB: 20 | 30 days supply | Qty: 30 | Fill #0

## 2020-04-25 NOTE — Progress Notes (Signed)
D/C instructions given and reviewed. Tele and IV removed, tolerated well. Awaiting TOC meds.

## 2020-04-25 NOTE — Plan of Care (Signed)

## 2020-04-25 NOTE — Discharge Summary (Signed)
Physician Discharge Summary  AVORY MIMBS ZOX:[id number] DOB: July 07, 1955 DOA: 03/26/2020  PCP: Merryl Hacker, No  Admit date: 03/26/2020 Discharge date: 04/25/2020  Admitted From: Home Disposition:   Home   Recommendations for Outpatient Follow-up and new medication changes:  1. Follow up with Primary care in 7 days.  2. Continue with fluconazole until 06/10/2020, for Cryptococcal meningitis. 3. Continue with Azithromycin, Rifabutin and Ethambutol for MAC. 4. Atovaquone.  5. Continue B blocker but discontinue calcium channel blocker.   Home Health: no  Equipment/Devices: no    Discharge Condition: stable  CODE STATUS: full  Diet recommendation: regular.   Brief/Interim Summary: Mr. 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   65 yo male with the past medical history of HIV, who presented with the chief complain of progressive and worsening headaches, associated with dizziness, photophobia, blurry vision and sinus pressure for 2 weeks. No fever and no chills. On his initial physical examination is temperature was 97,9, HR 77, BP 142/95, 205/108, 198/105 and oxygenation 97% on room air. Positive oral thrush, positive bilateral wheezing, heart S1 and S2 present and rhythmic, abdomen soft and no lower extremity edema.   Sodium 135 potassium 4.2, chloride 101, bicarb 25, glucose 117, BUN 12, creatinine 1.16, white count 3.7, hemoglobin 13.9, hematocrit 42.3, platelets 325.  EKG 175 bpm, normal axis, normal intervals, SVT rhythm, ST depression lead II, lead III, aVF, V4-V6, T wave inversion lead II, lead III, aVF. Positive LVH.   Patient underwent lumbar puncture, findings consistent with cryptococcal meningitis.  He was started on amphotericin B, methylprednisolone fluticasone.  For his SVT he was placed on diltiazem infusion, underwent ablation per EP 04/23/20 which was not  successful, recommendations to continue B blocker but discontinue calcium channel blocker.   Further work-up with CT chest showed cavitary lesion in the left upper and lower lobe. Sputum AFB DNA probe positive for Mycobacterium avium complex and patient was started on triple therapy with Azithromycin, Rifabutin and Ethambutol.   Discharge Diagnoses:  Principal Problem:   Cryptococcal meningitis ([place]) Active Problems:   Moderate protein malnutrition (HCC)   Hypoalbuminemia   AIDS (acquired immune deficiency syndrome) (HCC)   Aortic atherosclerosis (HCC)   Hypertension   SVT (supraventricular tachycardia) (HCC)   Acute nonintractable headache   Smoking   Cocaine use   Cavitary lesion of lung   AKI (acute kidney injury) ([place])   Mycobacterium avium infection ([place])   1.  Cryptococcal meningitis, in the setting of HIV AIDS.   CD4 absolute <35, CD4% 2.   Patient completed IV course of amphotericin, fluticasone, no transition to oral fluconazole.  Patient will continue therapy until Jun 10, 2020.  Outpatient follow-up with infectious disease Dr. Gale Journey.  At the time of his discharge no further headache or retro-orbital pain.  Head CT 02/24, no acute changes. Brain MRI 3/21, no acute intracranial normalities.  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  In the past patient has been noncompliant with antiretroviral therapy, will follow-up with infectious disease as an outpatient.  I encouraged him to stay compliant with his medications and explained him the importance of HIV therapy to prevent opportunistic infections.  2.  Pulmonary Mycobacterium avium complex, left lung cavitary lesions, upper lobe/lower lobe.  Patient was placed on triple therapy with azithromycin, rifabutin and ethambutol.  Atovaquone.  Follow-up with infectious disease clinic in 2 to 3 weeks.  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  3.  SVT.  Currently rate controlled with  beta blockade, no further calcium channel blocker. Follow-up with electrophysiology in 4 to 6 weeks, his SVT is located next to the AV node and self terminated after bumping the pathway.   4. 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 Hyponatremia, hyperkalemia, hypomagnesemia. SIADH.  Patient received supportive medical therapy with improvement of renal function and electrolytes. Peak serum cr at 1,63 and nadir Na 128. Patient received tolvaptan for hyponatremia, related to SIADH and sodium zirconium for hyperkalemia.   At discharge sodium 131, potassium 4.6, chloride 107, bicarb 18, glucose 93, BUN 31, creatinine 1.27, magnesium 1.9.  5. Chronic calcific pancreatitis/ moderate calorie protein malnutrition.  No abdominal pain, patient was placed on nutritional supplements.  6.  History of cocaine abuse, tobacco abuse.  Smoke cessation counseling, follow-up as an outpatient.  7.  Recent COVID-19 infection. COVID 19 infection in 01/22.   8. HTN/ aortic atherosclerosis/ dysplipidemia. Blood pressure control with B blocker.  Continue statin therapy.   Discharge Instructions   Allergies as of 04/25/2020      Reactions   Bactrim [sulfamethoxazole-trimethoprim] Other (See Comments)   Patient was trialed on DS TIW and SS daily for PJP prophylaxis and developed hyperkalemia and increased Scr      Medication List    STOP taking these medications   amoxicillin-clavulanate 875-125 MG tablet Commonly known as: Augmentin     TAKE these medications   acetaminophen 325 MG tablet Commonly known as: TYLENOL Take 2 tablets (650 mg total) by mouth every 4 (four) hours as needed for fever or headache.   atovaquone 750 MG/5ML suspension Commonly known as: MEPRON Take 10 mLs (1,500 mg total) by mouth daily.   azithromycin 500 MG tablet Commonly known as: Zithromax Take 1 tablet (500 mg total) by mouth daily. Take 1 tablet daily.   benzonatate 100 MG capsule Commonly known as: TESSALON Take 1 capsule (100 mg  total) by mouth every 8 (eight) hours.   ethambutol 400 MG tablet Commonly known as: MYAMBUTOL Take 3 tablets (1,200 mg total) by mouth daily.   fluconazole 200 MG tablet Commonly known as: DIFLUCAN Take 2 tablets (400 mg total) by mouth daily. Continue through 5/8   metoprolol tartrate 25 MG tablet Commonly known as: LOPRESSOR Take 1 tablet (25 mg total) by mouth 2 (two) times daily.   pravastatin 20 MG tablet Commonly known as: PRAVACHOL Take 1 tablet (20 mg total) by mouth every evening.   rifabutin 150 MG capsule Commonly known as: MYCOBUTIN Take 2 capsules (300 mg total) by mouth daily.       Follow-up Information    Medicaid Transportation Follow up.   Why: Use this to arrange transportation to appointments Contact information: [phone]       Rex Kras, DO Follow up on 05/11/2020.   Specialties: Cardiology, Radiology, Vascular Surgery Why: Appointment 05/11/20 at 10:30am. Please bring all medications  Contact information: [place] [address] [place] Follow up on 05/02/2020.   Why: Your appointment is at 1:30 pm. Please arrive early and bring: picture ID, current meds, insurance card. Contact information: [place] [phone] [phone]       Evans Lance, MD Follow up.   Specialty: Cardiology Why: on 5/17 at 1045 for post hospital follow up Contact information: [address] [place] [address] [phone]              Allergies  Allergen Reactions  . Bactrim [Sulfamethoxazole-Trimethoprim] Other (See Comments)    Patient was trialed on DS TIW and SS daily for PJP prophylaxis and developed hyperkalemia and increased Scr    Consultations: ID Cardiology  Neurology    Procedures/Studies: CT HEAD WO CONTRAST  Result Date: 03/29/2020 CLINICAL DATA:  Meningitis/CNS infection  suspected EXAM: CT HEAD WITHOUT CONTRAST TECHNIQUE: Contiguous axial images were obtained from the base of the skull through the vertex without intravenous contrast. COMPARISON:  03/26/2020 and prior. FINDINGS: Brain: No acute infarct or intracranial hemorrhage. No mass lesion. No midline shift, ventriculomegaly or extra-axial fluid collection. Vascular: No hyperdense vessel or unexpected calcification. Skull: Negative for fracture or focal lesion. Sinuses/Orbits: Normal orbits. Minimal ethmoid and maxillary sinus mucosal thickening. No mastoid effusion. Other: None. IMPRESSION: No acute intracranial process. Minimal sinus disease. Electronically Signed   By: Primitivo Gauze M.D.   On: 03/29/2020 16:58   CT CHEST W CONTRAST  Result Date: 03/28/2020 CLINICAL DATA:  Persistent cough. EXAM: CT CHEST WITH CONTRAST TECHNIQUE: Multidetector CT imaging of the chest was performed during intravenous contrast administration. CONTRAST:  53m OMNIPAQUE IOHEXOL 300 MG/ML  SOLN COMPARISON:  None. FINDINGS: Cardiovascular: Atherosclerotic calcification of the aorta. Heart is at the upper limits of normal to mildly enlarged with left ventricular dilatation. No pericardial effusion. Mediastinum/Nodes: Mediastinal lymph nodes measure up to 12 mm in the low left paratracheal station. Hilar lymph nodes measure up to 11 mm on the left. Subcarinal lymph nodes measure up to 1.7 cm. No axillary adenopathy. Periesophageal lymph nodes measure up to 12 mm (3/62). There may be slight distal esophageal wall thickening. Lungs/Pleura: Centrilobular and paraseptal emphysema. Nodular and cavitary areas of airspace consolidation in the left upper and left lower lobes with areas of bronchiectasis and volume loss. Minimal dependent atelectasis in the right lower lobe. No pleural fluid. Airway is otherwise unremarkable. Upper Abdomen: Visualized portions of the liver and adrenal glands are unremarkable. Scattered scarring in the kidneys.  Subcentimeter low-attenuation lesions in the kidneys are too small to characterize. Spleen is unremarkable. Chronic calcific pancreatitis. There may be a small hiatal hernia. Visualized portions of the stomach and bowel are otherwise grossly unremarkable. Upper abdominal lymph nodes measure up to 12 mm in the periportal region. Musculoskeletal: No worrisome lytic or sclerotic lesions. Disc calcification is seen at T10-11. IMPRESSION: 1. Nodular and cavitary airspace consolidation in the left upper and left lower lobes, [name], possibly reactive. Again, [name], as clinically indicated. 4. Areas of volume loss and bronchiectasis in the left upper and left lower lobes may be due to resolving infection/post infectious scarring. 5. Chronic calcific pancreatitis. 6.  Aortic atherosclerosis (ICD10-I70.0). 7.  Emphysema (ICD10-J43.9). Electronically Signed   By: MLorin PicketM.D.   On: 03/28/2020 08:22   MR BRAIN WO CONTRAST  Result Date: 04/23/2020 CLINICAL DATA:  65year old male with history of HIV, non compliant on therapy. Headache, dizziness. Status post lumbar puncture on 04/10/2020 with CSF analysis suspicious for cryptococcal meningitis. EXAM: MRI HEAD WITHOUT CONTRAST TECHNIQUE: Multiplanar, multiecho pulse sequences of the brain and surrounding structures were obtained without intravenous contrast. COMPARISON:  Brain MRI 03/26/2020.  Head CT 03/29/2020. FINDINGS:  Study is mildly degraded by motion artifact despite repeated imaging attempts. Brain: No restricted diffusion to suggest acute infarction. No midline shift, evidence of mass lesion, ventriculomegaly, or acute intracranial hemorrhage. Cervicomedullary junction and pituitary are  within normal limits. 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 These are 2-3 mm in thickness, minimally apparent by CT. No other extra-axial collection identified. No other chronic cerebral blood products identified. Underlying gray and white matter signal is stable and within normal limits for age. Vascular: Major intracranial vascular flow voids are stable since last month. Skull and upper cervical spine: Stable, negative. Sinuses/Orbits: Negative orbits. Mild paranasal sinus mucosal thickening has not significantly changed. There is trace new layering fluid in the maxillary sinuses. Other: Mastoids remain clear. Visible internal auditory structures appear normal. Negative visible scalp and face soft tissues. IMPRESSION: 1. No acute intracranial abnormality. 2. 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 3. Otherwise unremarkable for age noncontrast MRI appearance of the brain. 4. Mild paranasal sinus inflammation. Electronically Signed   By: Genevie Ann M.D.   On: 04/23/2020 11:59   MR Brain W and Wo Contrast  Result Date: 03/26/2020 CLINICAL DATA:  Neuro deficit, acute stroke suspected. Vision changes, headache. EXAM: MRI HEAD WITHOUT AND WITH CONTRAST TECHNIQUE: Multiplanar, multiecho pulse sequences of the brain and surrounding structures were obtained without and with intravenous contrast. CONTRAST:  87m GADAVIST GADOBUTROL 1 MMOL/ML IV SOLN COMPARISON:  Same day CTV. FINDINGS: Motion limited study. Brain: No acute infarction, hemorrhage, hydrocephalus, extra-axial collection or mass lesion. Vascular: Major arterial flow voids are maintained at the skull base. The area of abnormality seen on same-day CTV is poorly characterized on this study secondary to  motion and flow related artifacts. Skull and upper cervical spine: Normal marrow signal. Sinuses/Orbits: Mild ethmoid air cell mucosal thickening without air-fluid levels. Unremarkable orbits. Other: No mastoid effusions. IMPRESSION: 1. No evidence of acute nonvascular abnormality. 2. The area of abnormality seen on same-day CTV is poorly characterized on this study secondary to motion and flow related artifact. While the appearance on the CTV was atypical in appearance/morphology for acute thrombus, a follow up CTV in approximately one week could evaluate for change if the patient's symptoms do not improve or worsen. Electronically Signed   By: FMargaretha SheffieldMD   On: 03/26/2020 14:48   [place]NAL  Result Date: 04/13/2020 CLINICAL DATA:  Acute kidney injury. EXAM: RENAL / URINARY TRACT ULTRASOUND COMPLETE COMPARISON:  None. FINDINGS: Right Kidney: Renal measurements: 10.7 x 5.4 x 6.7 cm = volume: 203 mL. Borderline increased renal parenchymal echogenicity. Renal contours are lobulated. No hydronephrosis. No visualized calculi or focal renal lesion. Left Kidney: Renal measurements: 10.1 x 6.2 x 6.0 cm = volume: 197 mL. Borderline increased renal parenchymal echogenicity. Lobulated renal contours. No hydronephrosis. No visualized stone or focal lesion. Bladder: Appears normal for degree of bladder distention. Other: None. IMPRESSION: 1. No obstructive uropathy. 2. Lobulated renal contours with borderline increased parenchymal echogenicity suggesting mild chronic medical renal disease. Electronically Signed   By: MKeith RakeM.D.   On: 04/13/2020 17:34   EP STUDY  Result Date: 04/23/2020 CONCLUSIONS: 1. Sinus rhythm upon presentation. 2. The patient had a concealed right para-hisian AP and catheter manipulation around the pathway resulted in bumping the pathway and rendered the  pathway non-functional such that additional mapping and ablation could not be carried out. 3. No inducible arrhythmias following  catheter manipulation. 5. No early apparent complications. Carleene Overlie Taylor,MD 04/23/2020 6:08 PM   NM Myocar Multi W/Spect Tamela Oddi Motion / EF  Result Date: 03/29/2020  There was no ST segment deviation noted during stress.  The study is normal.  The left ventricular ejection fraction is mildly decreased (45-54%).  Normal pharmacologic nuclear stress test with no evidence for prior infarct or ischemia. LVEF 51%.  DG Chest Port 1 View  Result Date: 04/19/2020 CLINICAL DATA:  Shortness of breath EXAM: PORTABLE CHEST 1 VIEW COMPARISON:  March 21, 2020 chest radiograph and March 27, 2020 chest CT FINDINGS: There is scarring in the left apical region. There is no edema or airspace opacity. The heart size and pulmonary vascularity are normal. No adenopathy. There is aortic atherosclerosis. No bone lesions. IMPRESSION: Scarring left apex region. No edema or airspace opacity. Heart size within normal limits. Aortic Atherosclerosis (ICD10-I70.0). Electronically Signed   By: Lowella Grip III M.D.   On: 04/19/2020 09:14   ECHOCARDIOGRAM COMPLETE  Result Date: 03/27/2020    ECHOCARDIOGRAM REPORT   Patient Name:   MAHMOOD BOEHRINGER Date of Exam: 03/27/2020 Medical Rec #:  [id number]      Height:       73.0 in Accession #:    [id number]     Weight:       178.1 lb Date of Birth:  1955-07-15     BSA:          2.048 m Patient Age:    86 years       BP:           142/70 mmHg Patient Gender: M              HR:           56 bpm. Exam Location:  Inpatient Procedure: 2D Echo, Cardiac Doppler and Color Doppler Indications:    Atrial flutter  History:        Patient has no prior history of Echocardiogram examinations.                 Arrythmias:Atrial Flutter; Risk Factors:Hypertension. HIV+.  Sonographer:    Dustin Flock Referring Phys: [id number] [place]  1. Left ventricular ejection fraction, by estimation, is 50 to 55%. The left ventricle has low normal function. The left ventricle has no  regional wall motion abnormalities. There is mild concentric left ventricular hypertrophy. Left ventricular diastolic parameters are indeterminate.  2. Right ventricular systolic function is normal. The right ventricular size is normal.  3. Left atrial size was mildly dilated.  4. Right atrial size was mildly dilated.  5. The mitral valve is normal in structure. Trivial mitral valve regurgitation. No evidence of mitral stenosis.  6. The aortic valve is tricuspid. Aortic valve regurgitation is not visualized. No aortic stenosis is present.  7. The inferior vena cava is normal in size with greater than 50% respiratory variability, suggesting right atrial pressure of 3 mmHg. Comparison(s): No prior Echocardiogram. Conclusion(s)/Recommendation(s): Normal biventricular function without evidence of hemodynamically significant valvular heart disease. FINDINGS  Left Ventricle: Left ventricular ejection fraction, by estimation, is 50 to 55%. The left ventricle has low normal function. The left ventricle has no regional wall motion abnormalities. The left ventricular internal cavity size was normal in size. There is mild concentric left ventricular hypertrophy. Left ventricular diastolic parameters are indeterminate. Right Ventricle: The right  ventricular size is normal. Right vetricular wall thickness was not well visualized. Right ventricular systolic function is normal. Left Atrium: Left atrial size was mildly dilated. Right Atrium: Right atrial size was mildly dilated. Pericardium: There is no evidence of pericardial effusion. Mitral Valve: The mitral valve is normal in structure. Trivial mitral valve regurgitation. No evidence of mitral valve stenosis. Tricuspid Valve: The tricuspid valve is normal in structure. Tricuspid valve regurgitation is not demonstrated. No evidence of tricuspid stenosis. Aortic Valve: The aortic valve is tricuspid. Aortic valve regurgitation is not visualized. No aortic stenosis is present.  Pulmonic Valve: The pulmonic valve was grossly normal. Pulmonic valve regurgitation is not visualized. No evidence of pulmonic stenosis. Aorta: The aortic root, ascending aorta and aortic arch are all structurally normal, with no evidence of dilitation or obstruction. Venous: The inferior vena cava is normal in size with greater than 50% respiratory variability, suggesting right atrial pressure of 3 mmHg. IAS/Shunts: The atrial septum is grossly normal.  LEFT VENTRICLE PLAX 2D LVIDd:         5.00 cm  Diastology LVIDs:         3.80 cm  LV e' medial:    5.77 cm/s LV PW:         1.20 cm  LV E/e' medial:  14.8 LV IVS:        1.30 cm  LV e' lateral:   7.51 cm/s LVOT diam:     2.70 cm  LV E/e' lateral: 11.4 LV SV:         121 LV SV Index:   59 LVOT Area:     5.73 cm  RIGHT VENTRICLE RV Basal diam:  3.00 cm RV S prime:     13.20 cm/s TAPSE (M-mode): 2.6 cm LEFT ATRIUM             Index       RIGHT ATRIUM           Index LA diam:        3.20 cm 1.56 cm/m  RA Area:     15.60 cm LA Vol (A2C):   42.5 ml 20.75 ml/m RA Volume:   39.30 ml  19.19 ml/m LA Vol (A4C):   44.2 ml 21.58 ml/m LA Biplane Vol: 44.9 ml 21.92 ml/m  AORTIC VALVE LVOT Vmax:   99.80 cm/s LVOT Vmean:  67.700 cm/s LVOT VTI:    0.211 m  AORTA Ao Root diam: 3.20 cm MITRAL VALVE MV Area (PHT): 3.72 cm    SHUNTS MV Decel Time: 204 msec    Systemic VTI:  0.21 m MV E velocity: 85.30 cm/s  Systemic Diam: 2.70 cm MV A velocity: 70.70 cm/s MV E/A ratio:  1.21 Buford Dresser MD Electronically signed by Buford Dresser MD Signature Date/Time: 03/27/2020/7:28:47 PM    Final    CT VENOGRAM HEAD  Result Date: 03/26/2020 CLINICAL DATA:  Dural venous sinus thrombosis suspected. Headache. On antibiotics for sinusitis. EXAM: CT VENOGRAM HEAD TECHNIQUE: Contiguous axial images were obtained from the base of the skull through the vertex during the bolus administration of intravenous contrast using CTV timing. Multiplanar CT image reconstructions and MIPs were  obtained to evaluate the venous anatomy. CONTRAST:  75m OMNIPAQUE IOHEXOL 350 MG/ML SOLN COMPARISON:  None. FINDINGS: Brain: No evidence of acute large vascular territory infarct, acute hemorrhage, hydrocephalus, extra-axial fluid collection or mass lesion. Mild patchy white matter hypoattenuation is nonspecific but most likely related to chronic microvascular ischemic disease. Vascular: Calcific atherosclerosis. Major arteries are  grossly patent where visualized. Small focal hypodensity along the superior aspect of the superior sagittal sinus, possibly extradural in location (see series 10, image 134 and series 5, image 67). Otherwise, dural sinuses are patent. Small left transverse sinus. Skull: No acute fracture. Sinuses/orbits: Scattered ethmoid air cell mucosal thickening without air-fluid level. Unremarkable orbits. Other: No mastoid effusions. IMPRESSION: 1. Small focal hypodensity along the superior aspect of the superior sagittal sinus, possibly extradural in location and not typical in appearance/morphology for acute thrombus. Per Dr. Sherry Ruffing an MRI with and without contrast has already been ordered, which will further evaluate. 2. Ethmoid air cell mucosal thickening without air-fluid levels. Findings discussed with Dr. Sherry Ruffing via telephone at 12:36 PM. Electronically Signed   By: Margaretha Sheffield MD   On: 03/26/2020 12:42      Procedures: Lumbar puncture   Subjective: Patient is feeling better, headache and eye pain have improved, no nausea or vomiting, no visual changes,   Discharge Exam: Vitals:   04/25/20 0449 04/25/20 0742  BP: 113/82 128/82  Pulse: (!) 57 (!) 52  Resp: (!) 23 16  Temp: 98 F (36.7 C) 97.9 F (36.6 C)  SpO2: 97% 99%   Vitals:   04/24/20 2042 04/25/20 0156 04/25/20 0449 04/25/20 0742  BP: 124/88 101/74 113/82 128/82  Pulse: (!) 54 (!) 50 (!) 57 (!) 52  Resp: 15 17 (!) 23 16  Temp: 97.9 F (36.6 C) 97.7 F (36.5 C) 98 F (36.7 C) 97.9 F (36.6 C)   TempSrc: Oral Oral Oral Oral  SpO2: 100% 97% 97% 99%  Weight:   74.9 kg   Height:        General: Not in pain or dyspnea.  Neurology: Awake and alert, non focal  E ENT: no pallor, no icterus, oral mucosa moist Cardiovascular: No JVD. S1-S2 present, rhythmic, no gallops, rubs, or murmurs. No lower extremity edema. Pulmonary: positive breath sounds bilaterally, adequate air movement, no wheezing, rhonchi or rales. Gastrointestinal. Abdomen soft and non tender Skin. No rashes Musculoskeletal: no joint deformities   The results of significant diagnostics from this hospitalization (including imaging, microbiology, ancillary and laboratory) are listed below for reference.     Microbiology: No results found for this or any previous visit (from the past 240 hour(s)).   Labs: BNP (last 3 results) Recent Labs    04/20/20 0357 04/21/20 0130 04/22/20 0323  BNP 54.3 113.7* 11.6   Basic Metabolic Panel: Recent Labs  Lab 04/21/20 0130 04/22/20 0323 04/23/20 0500 04/24/20 0405 04/25/20 0340  NA 130* 132* 131* 134* 131*  K 3.7 4.1 4.3 4.4 4.6  CL 100 102 106 108 107  CO2 22 20* 20* 20* 18*  GLUCOSE 153* 135* 100* 136* 93  BUN _0 31*  CREATININE 1.19 1.12 1.03 1.11 1.27*  CALCIUM 8.7* 8.8* 8.9 9.0 8.8*  MG 1.9 1.8 1.7 1.6* 1.9   Liver Function Tests: No results for input(s): AST, ALT, ALKPHOS, BILITOT, PROT, ALBUMIN in the last 168 hours. No results for input(s): LIPASE, AMYLASE in the last 168 hours. No results for input(s): AMMONIA in the last 168 hours. CBC: Recent Labs  Lab 04/21/20 0130 04/22/20 0323 04/23/20 0500 04/24/20 0405 04/25/20 0340  WBC 5.4 3.7* 3.0* 2.7* 4.3  NEUTROABS 3.8 1.8 1.2* 1.3* 2.6  HGB 10.8* 10.4* 10.6* 10.3* 9.8*  HCT 30.8* 31.2* 31.1* 30.3* 28.7*  MCV 95.7 97.8 98.1 96.8 96.6  PLT 211 213 236 232 240   Cardiac Enzymes: No results for  input(s): CKTOTAL, CKMB, CKMBINDEX, TROPONINI in the last 168 hours. BNP: Invalid input(s):  POCBNP CBG: Recent Labs  Lab 04/19/20 0321  GLUCAP 138*   D-Dimer No results for input(s): DDIMER in the last 72 hours. Hgb A1c No results for input(s): HGBA1C in the last 72 hours. Lipid Profile No results for input(s): CHOL, HDL, LDLCALC, TRIG, CHOLHDL, LDLDIRECT in the last 72 hours. Thyroid function studies No results for input(s): TSH, T4TOTAL, T3FREE, THYROIDAB in the last 72 hours.  Invalid input(s): FREET3 Anemia work up No results for input(s): VITAMINB12, FOLATE, FERRITIN, TIBC, IRON, RETICCTPCT in the last 72 hours. Urinalysis    Component Value Date/Time   COLORURINE YELLOW 04/15/2020 1455   APPEARANCEUR HAZY (A) 04/15/2020 1455   LABSPEC 1.006 04/15/2020 1455   PHURINE 5.0 04/15/2020 1455   GLUCOSEU NEGATIVE 04/15/2020 1455   HGBUR SMALL (A) 04/15/2020 1455   BILIRUBINUR NEGATIVE 04/15/2020 1455   KETONESUR NEGATIVE 04/15/2020 1455   PROTEINUR NEGATIVE 04/15/2020 1455   NITRITE NEGATIVE 04/15/2020 1455   LEUKOCYTESUR LARGE (A) 04/15/2020 1455   Sepsis Labs Invalid input(s): PROCALCITONIN,  WBC,  LACTICIDVEN Microbiology No results found for this or any previous visit (from the past 240 hour(s)).   Time coordinating discharge: 45 minutes  SIGNED:   Tawni Millers, MD  [facility] 04/25/2020, 8:24 AM

## 2020-04-25 NOTE — TOC Transition Note (Signed)
Transition of Care [facility]) - CM/SW Discharge Note   Patient Details  Name: Ricky [name] MRN: [id number] Date of Birth: 1955-12-16  [facility]) CM/SW Contact:  Zenon Mayo, RN Phone Number: 04/25/2020, 10:26 AM   Clinical Narrative:    From home with his sister, he has a follow up apt with the [facility].  He has no issues with getting his medications.  He states his brother will be transporting him home today.     Final next level of care: Home/Self Care Barriers to Discharge: No Barriers Identified   Patient Goals and CMS Choice Patient states their goals for this hospitalization and ongoing recovery are:: go home   Choice offered to / list presented to : NA  Discharge Placement                       Discharge Plan and Services   Discharge Planning Services: CM Consult Post Acute Care Choice: NA            DME Agency: NA       HH Arranged: NA          Social Determinants of Health (SDOH) Interventions     Readmission Risk Interventions Readmission Risk Prevention Plan 04/25/2020  Transportation Screening Complete  PCP or Specialist Appt within 3-5 Days Complete  HRI or [place] Complete  Social Work Consult for [place] Planning/Counseling Complete  Palliative Care Screening Not Applicable  Medication Review Press photographer) Complete  Some recent data might be hidden

## 2020-04-25 NOTE — Progress Notes (Addendum)
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  Subjective: Patient reports that he has had no headache or left eye retro-orbital pain today and reports that he received a medication yesterday morning that alleviated his pain; he took the medication again last night prophylactically. Upon review of records patient was given Ketorolac yesterday for pain. Patient reports that he is now able to look to the left without pain but he still has some residual photophobia. Patient reports that his headache had been "both achy and painful" but has resolved. Patient does report he is still a little dizzy, but feels that this is improving.   Exam:     Vitals:   04/23/20 0456 04/23/20 0700  BP: 112/79 115/82  Pulse: (!) 51 (!) 52  Resp: 16 17  Temp: 98.2 F (36.8 C) 98.1 F (36.7 C)  SpO2: 99% 99%   Gen: Laying in bed, watching television Resp: non-labored breathing, no acute distress Abd: soft, non-distended, non-tender  Neuro: Ment: Awake, alert, and oriented to person, place, time, and situation. Patient is a poor historian often giving complicated history with differing details. His speech is without dysarthria or aphasia. Comprehension appropriate. Attention has significantly improved and patient is able to talk in depth about his hospitalization and talks with MD about his plans at discharge for better self-care and follow-up. CN:  II: PERRL 2 mm/brisk. Visual fields full.  III, IV, VI: EOMI. V: Sensation is intact to light touch and symmetrical to face.  ELF:YBOF is symmetric resting and smiling VIII:Hearing intact to  voice. IX, X: Palate elevates symmetrically. Phonation is normal.  XI: Shoulder shrug 5/5. BPZ:WCHENIDPOEUMPNT midline Motor: 5/5 strength BUE 4/5 strength in BLE.  Tone: is normal. Bulk is normal. Toes are down- going. Sensation- Intactand symmetricto light touch in upper and lower extremities. Coordination: FTN intact bilaterally.  Pertinent Labs: CBC Labs (Brief)          Component Value Date/Time   WBC 3.0 (L) 04/23/2020 0500   RBC 3.17 (L) 04/23/2020 0500   HGB 10.6 (L) 04/23/2020 0500   HGB 12.9 (L) 04/15/2020 0405   HCT 31.1 (L) 04/23/2020 0500   PLT 236 04/23/2020 0500   MCV 98.1 04/23/2020 0500   MCH 33.4 04/23/2020 0500   MCHC 34.1 04/23/2020 0500   RDW 11.4 (L) 04/23/2020 0500   LYMPHSABS 0.8 04/23/2020 0500   MONOABS 0.5 04/23/2020 0500   EOSABS 0.4 04/23/2020 0500   BASOSABS 0.0 04/23/2020 0500     CMP     Labs (Brief)          Component Value Date/Time   NA 131 (L) 04/23/2020 0500   K 4.3 04/23/2020 0500   CL 106 04/23/2020 0500   CO2 20 (L) 04/23/2020 0500   GLUCOSE 100 (H) 04/23/2020 0500   BUN 21 04/23/2020 0500   CREATININE 1.03 04/23/2020 0500   CALCIUM 8.9 04/23/2020 0500   PROT 8.0 04/17/2020 0750   ALBUMIN 3.1 (L) 04/17/2020 0750   AST 30 04/17/2020 0750   ALT 26 04/17/2020 0750   ALKPHOS 162 (H) 04/17/2020 0750   BILITOT 0.9 04/17/2020 0750   GFRNONAA >60 04/23/2020 0500     CSF from 3/8: WBC PRESENT, PREDOMINANTLY MONONUCLEAR  CORRECTED  RESULTS NO ORGANISMS SEEN  PREVIOUSLY REPORTED AS: CRYPTOCOCCUS NEOFORMANS  Opening pressure was 18 cm H2O at this time  Imaging Reviewed: MRI brain 3/21- 1. No acute intracranial abnormality. 2. 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 3. Otherwise unremarkable for age noncontrast MRI appearance of the brain. 4. Mild paranasal sinus inflammation.  Assessment:  65 year oldmale with HIV initially admitted withHAfound to haveCSF withcryptococcal antigen positive. His infection and HIV has been managed by primary team and ID. His HAs have decreased overall. He has had4LPs with opening pressures of27,26, 14, and 18 respectively. Last LP on 3/8 without resolution of cryptococcus neoformans. Today (3/23) neurology reassessed the patient for previous described worsening left retro orbital pain. - Patient reports no pain today and notes that although he received Toradol yesterday at 2218 patient reports that this was actually prophylactic and he had not had a headache since the morning of 3/22. Patient has not felt he requires the medication today. - LP opening pressures with downward trend since treatment of cryptococcus neoformans with CSF culture 3/8 showing no growth. Do not feel strongly that LP will improve headache at this time and will cause further risk of dural puncture headache with repeat LP.  - Overall pattern of unilateral headache worsened with standing would be atypical for a headache due to elevated CSF pressure. Improvement in headache in supine position is more consistent with a post dural puncture headache. Patient is now reporting resolution of his pain and labs show normalization of WBC.  Rebound headache due to analgesic overuse is more likely.  Recommendations: - Continue infection treatment - Encourage patient to f/u with ID OP - PT - Encourage liberal amounts of PO caffeine intake  - Management of SIADH    Damita Dunnings, MD PGY-1  Electronically signed: Dr. Kerney Elbe '

## 2020-04-25 NOTE — TOC Initial Note (Signed)
Transition of Care [facility]) - Initial/Assessment Note    Patient Details  Name: Ricky [name] MRN: [id number] Date of Birth: 06-13-1955  Transition [facility]) CM/SW Contact:    Zenon Mayo, RN Phone Number: 04/25/2020, 10:24 AM  Clinical Narrative:                 From home with his sister, he has a follow up apt with the [facility].  He has no issues with getting his medications.  He states his brother will be transporting him home today.   Expected Discharge Plan: Home/Self Care Barriers to Discharge: No Barriers Identified   Patient Goals and CMS Choice Patient states their goals for this hospitalization and ongoing recovery are:: go home   Choice offered to / list presented to : NA  Expected Discharge Plan and Services Expected Discharge Plan: Home/Self Care   Discharge Planning Services: CM Consult Post Acute Care Choice: NA Living arrangements for the past 2 months: Single Family Home Expected Discharge Date: 04/25/20                 DME Agency: NA       HH Arranged: NA          Prior Living Arrangements/Services Living arrangements for the past 2 months: Single Family Home Lives with:: Siblings Patient language and need for interpreter reviewed:: Yes Do you feel safe going back to the place where you live?: Yes      Need for Family Participation in Patient Care: Yes (Comment) Care giver support system in place?: Yes (comment)   Criminal Activity/Legal Involvement Pertinent to Current Situation/Hospitalization: No - Comment as needed  Activities of Daily Living Home Assistive Devices/Equipment: None ADL Screening (condition at time of admission) Patient's cognitive ability adequate to safely complete daily activities?: Yes Is the patient deaf or have difficulty hearing?: No Does the patient have difficulty seeing, even when wearing glasses/contacts?: No Does the patient have difficulty concentrating, remembering, or making  decisions?: No Patient able to express need for assistance with ADLs?: No Does the patient have difficulty dressing or bathing?: No Independently performs ADLs?: Yes (appropriate for developmental age) Does the patient have difficulty walking or climbing stairs?: No Weakness of Legs: Both Weakness of Arms/Hands: None  Permission Sought/Granted                  Emotional Assessment Appearance:: Appears stated age Attitude/Demeanor/Rapport: Engaged Affect (typically observed): Appropriate Orientation: : Oriented to Self,Oriented to Place,Oriented to  Time,Oriented to Situation Alcohol / Substance Use: Not Applicable Psych Involvement: No (comment)  Admission diagnosis:  Transient vision disturbance [H53.9] Cryptococcal meningitis (HCC) [B45.1] Acute nonintractable headache, unspecified headache type [R51.9] Patient Active Problem List   Diagnosis Date Noted  . Mycobacterium avium infection ([place])   . Blurry vision, left eye   . AKI (acute kidney injury) ([place])   . Cavitary lesion of lung   . Acute nonintractable headache   . Smoking   . Cocaine use   . SVT (supraventricular tachycardia) ([place])   . Abnormal EKG   . Atrial flutter ([place]) 03/27/2020  . Aortic atherosclerosis ([place]) 03/27/2020  . Hypertension 03/27/2020  . Cryptococcal meningitis ([place]) 03/26/2020  . Leukocytopenia 03/26/2020  . Macrocytosis 03/26/2020  . Moderate protein malnutrition ([place]) 03/26/2020  . Hypoalbuminemia 03/26/2020  . AIDS (acquired immune deficiency syndrome) ([place]) 03/26/2020   PCP:  Pcp, No Pharmacy:   [id number] - [place], [place]  [address] [place] [address] Phone: [phone] Fax: [phone]  [facility] [place] [place] - [address] [place] [address] Phone: [phone] Fax: [phone]     Social Determinants of Health (SDOH)  Interventions    Readmission Risk Interventions Readmission Risk Prevention Plan 04/25/2020  Transportation Screening Complete  PCP or Specialist Appt within 3-5 Days Complete  HRI or [place] Complete  Social Work Consult for [place] Planning/Counseling Complete  Palliative Care Screening Not Applicable  Medication Review Press photographer) Complete  Some recent data might be hidden

## 2020-05-01 LAB — FUNGUS CULTURE WITH STAIN

## 2020-05-01 LAB — FUNGUS CULTURE RESULT

## 2020-05-01 LAB — FUNGAL ORGANISM REFLEX

## 2020-05-02 ENCOUNTER — Inpatient Hospital Stay (INDEPENDENT_AMBULATORY_CARE_PROVIDER_SITE_OTHER): Payer: Medicaid Other | Admitting: Primary Care

## 2020-05-02 LAB — FUNGUS CULTURE WITH STAIN

## 2020-05-02 LAB — CULTURE, FUNGUS WITHOUT SMEAR

## 2020-05-02 LAB — FUNGUS CULTURE RESULT

## 2020-05-02 LAB — FUNGAL ORGANISM REFLEX

## 2020-05-07 ENCOUNTER — Telehealth: Payer: Self-pay

## 2020-05-07 NOTE — Telephone Encounter (Signed)
Called patient to get hospital follow up reschedule, number on the patients chart belongs to someone else and there isn't another number for patient to be reached at.

## 2020-05-09 NOTE — Progress Notes (Deleted)
Date:  05/09/2020   ID:  Ricky [name], DOB May 24, 1955, MRN [id number]  PCP:  Pcp, No  Cardiologist:  Rex Kras, DO, [facility] (established care 05/11/2020) Former Cardiology Providers: ***  REASON FOR CONSULT: ***  REQUESTING PHYSICIAN:  No referring provider defined for this encounter.  No chief complaint on file.   HPI  Ricky [name] is a 65 y.o. male who presents to the office with a chief complaint of "***." Patient's past medical history and cardiovascular risk factors include: hypertension, atrial flutter, aortic atherosclerosis, supraventricular tachycardia, ***  He is referred to the office at the request of No ref. provider found for evaluation of ***.  ***  History of  Denies prior history of coronary artery disease, myocardial infarction, congestive heart failure, deep venous thrombosis, pulmonary embolism, stroke, transient ischemic attack.  FUNCTIONAL STATUS: ***   ALLERGIES: Allergies  Allergen Reactions  . Bactrim [Sulfamethoxazole-Trimethoprim] Other (See Comments)    Patient was trialed on DS TIW and SS daily for PJP prophylaxis and developed hyperkalemia and increased Scr    MEDICATION LIST PRIOR TO VISIT: No outpatient medications have been marked as taking for the 05/11/20 encounter (Appointment) with Terri Skains, [place], DO.     PAST MEDICAL HISTORY: Past Medical History:  Diagnosis Date  . Human immunodeficiency virus (HIV) disease ([place]) 03/26/2020    PAST SURGICAL HISTORY: Past Surgical History:  Procedure Laterality Date  . SVT ABLATION N/A 04/23/2020   Procedure: SVT ABLATION;  Surgeon: Evans Lance, MD;  Location: [place] [facility];  Service: Cardiovascular;  Laterality: N/A;    FAMILY HISTORY: The patient family history includes Hypertension in his brother, mother, and sister; Kidney disease in his mother.  SOCIAL HISTORY:  The patient  reports that he has been smoking cigarettes. He has quit using smokeless tobacco. He reports current  alcohol use of about 14.0 - 21.0 standard drinks of alcohol per week. He reports previous drug use.  REVIEW OF SYSTEMS: ROS  PHYSICAL EXAM: Vitals with BMI 04/25/2020 04/25/2020 04/25/2020  Height - - -  Weight - 165 lbs 2 oz -  BMI - 72.62 -  Systolic 035 597 416  Diastolic 82 82 74  Pulse 52 57 50    CONSTITUTIONAL: Well-developed and well-nourished. No acute distress.  SKIN: Skin is warm and dry. No rash noted. No cyanosis. No pallor. No jaundice HEAD: Normocephalic and atraumatic.  EYES: No scleral icterus MOUTH/THROAT: Moist oral membranes.  NECK: No JVD present. No thyromegaly noted. No carotid bruits  LYMPHATIC: No visible cervical adenopathy.  CHEST Normal respiratory effort. No intercostal retractions  LUNGS: *** No stridor. No wheezes. No rales.  CARDIOVASCULAR: *** ABDOMINAL: No apparent ascites.  EXTREMITIES: No peripheral edema  HEMATOLOGIC: No significant bruising NEUROLOGIC: Oriented to person, place, and time. Nonfocal. Normal muscle tone.  PSYCHIATRIC: Normal mood and affect. Normal behavior. Cooperative  CARDIAC DATABASE: EKG: 04/20/2020: Supraventricular tachycardia ST & T wave abnormality, consider inferior ischemia Abnormal ECG heart rate decreased since previous and ST changes improved  Echocardiogram: 03/27/2020: Left ventricular ejection fraction, by estimation, is 50 to 55%. The left ventricle has low normal function. The left ventricle has no regional wall motion abnormalities. There is mild concentric left ventricular hypertrophy. Left ventricular  diastolic parameters are indeterminate. Right ventricular systolic function is normal. The right ventricular size is normal. Left atrial size was mildly dilated. Right atrial size was mildly dilated. The mitral valve is normal in structure. Trivial mitral valve  regurgitation. No evidence of mitral  stenosis. The aortic valve is tricuspid. Aortic valve regurgitation is not visualized. No aortic stenosis is  present. The inferior vena cava is normal in size with greater than 50% respiratory variability, suggesting right atrial pressure of 3 mmHg.     Stress Testing: No results found for this or any previous visit from the past 1095 days.   Heart Catheterization: ***  LABORATORY DATA: CBC Latest Ref Rng & Units 04/25/2020 04/24/2020 04/23/2020  WBC 4.0 - 10.5 K/uL 4.3 2.7(L) 3.0(L)  Hemoglobin 13.0 - 17.0 g/dL 9.8(L) 10.3(L) 10.6(L)  Hematocrit 39.0 - 52.0 % 28.7(L) 30.3(L) 31.1(L)  Platelets 150 - 400 K/uL 240 232 236    CMP Latest Ref Rng & Units 04/25/2020 04/24/2020 04/23/2020  Glucose 70 - 99 mg/dL 93 136(H) 100(H)  BUN 8 - 23 mg/dL 31(H) 22 21  Creatinine 0.61 - 1.24 mg/dL 1.27(H) 1.11 1.03  Sodium 135 - 145 mmol/L 131(L) 134(L) 131(L)  Potassium 3.5 - 5.1 mmol/L 4.6 4.4 4.3  Chloride 98 - 111 mmol/L 107 108 106  CO2 22 - 32 mmol/L 18(L) 20(L) 20(L)  Calcium 8.9 - 10.3 mg/dL 8.8(L) 9.0 8.9  Total Protein 6.5 - 8.1 g/dL - - -  Total Bilirubin 0.3 - 1.2 mg/dL - - -  Alkaline Phos 38 - 126 U/L - - -  AST 15 - 41 U/L - - -  ALT 0 - 44 U/L - - -    Lipid Panel     Component Value Date/Time   CHOL 141 03/29/2020 0255   TRIG 118 03/29/2020 0255   HDL 31 (L) 03/29/2020 0255   CHOLHDL 4.5 03/29/2020 0255   VLDL 24 03/29/2020 0255   LDLCALC 86 03/29/2020 0255    No components found for: NTPROBNP No results for input(s): PROBNP in the last 8760 hours. Recent Labs    03/27/20 0949  TSH 0.628    BMP Recent Labs    04/23/20 0500 04/24/20 0405 04/25/20 0340  NA 131* 134* 131*  K 4.3 4.4 4.6  CL 106 108 107  CO2 20* 20* 18*  GLUCOSE 100* 136* 93  BUN 21 22 31*  CREATININE 1.03 1.11 1.27*  CALCIUM 8.9 9.0 8.8*  GFRNONAA >60 >60 >60    HEMOGLOBIN A1C No results found for: HGBA1C, MPG  IMPRESSION:  No diagnosis found.   RECOMMENDATIONS: Ricky [name] is a 65 y.o. male whose past medical history and cardiac risk factors include: ***   FINAL MEDICATION LIST END  OF ENCOUNTER: No orders of the defined types were placed in this encounter.   There are no discontinued medications.   Current Outpatient Medications:  .  acetaminophen (TYLENOL) 325 MG tablet, Take 2 tablets (650 mg total) by mouth every 4 (four) hours as needed for fever or headache., Disp: , Rfl:  .  atovaquone (MEPRON) 750 MG/5ML suspension, Take 10 mLs (1,500 mg total) by mouth daily., Disp: 300 mL, Rfl: 11 .  azithromycin (ZITHROMAX) 500 MG tablet, Take 1 tablet (500 mg total) by mouth daily. Take 1 tablet daily., Disp: 30 tablet, Rfl: 11 .  benzonatate (TESSALON) 100 MG capsule, Take 1 capsule (100 mg total) by mouth every 8 (eight) hours., Disp: 21 capsule, Rfl: 0 .  ethambutol (MYAMBUTOL) 400 MG tablet, Take 3 tablets (1,200 mg total) by mouth daily., Disp: 90 tablet, Rfl: 11 .  fluconazole (DIFLUCAN) 200 MG tablet, Take 2 tablets (400 mg total) by mouth daily. Continue through 5/8, Disp: 108 tablet, Rfl: 1 .  metoprolol tartrate (  LOPRESSOR) 25 MG tablet, Take 1 tablet (25 mg total) by mouth 2 (two) times daily., Disp: 60 tablet, Rfl: 0 .  metoprolol tartrate (LOPRESSOR) 25 MG tablet, TAKE 1 TABLET (25 MG TOTAL) BY MOUTH TWO TIMES DAILY., Disp: 60 tablet, Rfl: 0 .  pravastatin (PRAVACHOL) 20 MG tablet, Take 1 tablet (20 mg total) by mouth every evening., Disp: 30 tablet, Rfl: 0 .  pravastatin (PRAVACHOL) 20 MG tablet, TAKE 1 TABLET (20 MG TOTAL) BY MOUTH EVERY EVENING., Disp: 30 tablet, Rfl: 0 .  rifabutin (MYCOBUTIN) 150 MG capsule, Take 2 capsules (300 mg total) by mouth daily., Disp: 60 capsule, Rfl: 11 .  rifabutin (MYCOBUTIN) 150 MG capsule, TAKE 2 CAPSULES (300 MG TOTAL) BY MOUTH DAILY., Disp: 30 capsule, Rfl: 11  No orders of the defined types were placed in this encounter.   There are no Patient Instructions on file for this visit.   --Continue cardiac medications as reconciled in final medication list. --No follow-ups on file. Or sooner if needed. --Continue follow-up with  your primary care physician regarding the management of your other chronic comorbid conditions.  Patient's questions and concerns were addressed to his satisfaction. He voices understanding of the instructions provided during this encounter.   This note was created using a voice recognition software as a result there may be grammatical errors inadvertently enclosed that do not reflect the nature of this encounter. Every attempt is made to correct such errors.  Rex Kras, [place], [facility]  Pager: [phone] Office: [phone]

## 2020-05-11 ENCOUNTER — Ambulatory Visit: Payer: Medicaid Other | Admitting: Cardiology

## 2020-05-16 ENCOUNTER — Ambulatory Visit: Payer: Self-pay | Admitting: *Deleted

## 2020-05-16 NOTE — Telephone Encounter (Signed)
Pt reports multiple issues since D/ced from hospital end of March. Pt is evasive historian. Reports lightheadedness, "Everytime I get up and walk, have to hold on to things." Reports generalized weakness, no energy, productive cough "Thick white." Onset of symptoms 2 weeks ago, dizziness worsening. SVT ablation 04/23/20. No shows for hospital follow ups. Advised ED. Pt states will follow disposition.  Reason for Disposition . SEVERE dizziness (e.g., unable to stand, requires support to walk, feels like passing out now)  Answer Assessment - Initial Assessment Questions 1. DESCRIPTION: "Describe your dizziness."     lightheaded 2. LIGHTHEADED: "Do you feel lightheaded?" (e.g., somewhat faint, woozy, weak upon standing) yes    3. VERTIGO: "Do you feel like either you or the room is spinning or tilting?" (i.e. vertigo)     no 4. SEVERITY: "How bad is it?"  "Do you feel like you are going to faint?" "Can you stand and walk?"   - MILD: Feels slightly dizzy, but walking normally.   - MODERATE: Feels very unsteady when walking, but not falling; interferes with normal activities (e.g., school, work) .   - SEVERE: Unable to walk without falling, or requires assistance to walk without falling; feels like passing out now.      Moderate-severe 5. ONSET:  "When did the dizziness begin?"     2 weeks ago 6. AGGRAVATING FACTORS: "Does anything make it worse?" (e.g., standing, change in head position)     walking 7. HEART RATE: "Can you tell me your heart rate?" "How many beats in 15 seconds?"  (Note: not all patients can do this)       8. CAUSE: "What do you think is causing the dizziness?"    unsure 9. RECURRENT SYMPTOM: "Have you had dizziness before?" If Yes, ask: "When was the last time?" "What happened that time?" no   10. OTHER SYMPTOMS: "Do you have any other symptoms?" (e.g., fever, chest pain, vomiting, diarrhea, bleeding) No energy, generalized weakness  Protocols used: DIZZINESS Heidi Dach

## 2020-06-12 ENCOUNTER — Ambulatory Visit: Payer: Medicaid Other | Admitting: Cardiology

## 2020-06-12 NOTE — Progress Notes (Signed)
No show.   No charge.   Rex Kras, [place], [facility]  Pager: [phone] Office: [phone]

## 2020-06-19 ENCOUNTER — Ambulatory Visit: Payer: Medicaid Other | Admitting: Internal Medicine

## 2020-10-12 ENCOUNTER — Other Ambulatory Visit: Payer: Self-pay

## 2020-10-12 ENCOUNTER — Emergency Department (HOSPITAL_COMMUNITY): Payer: Medicaid Other

## 2020-10-12 ENCOUNTER — Inpatient Hospital Stay (HOSPITAL_COMMUNITY)
Admission: EM | Admit: 2020-10-12 | Discharge: 2020-10-26 | DRG: 975 | Disposition: A | Discharge status: Home / Self Care | Payer: Medicaid Other | Attending: Family Medicine | Admitting: Family Medicine

## 2020-10-12 DIAGNOSIS — J1282 Pneumonia due to coronavirus disease 2019: Secondary | ICD-10-CM | POA: Diagnosis not present

## 2020-10-12 DIAGNOSIS — Z91199 Patient's noncompliance with other medical treatment and regimen due to unspecified reason: Secondary | ICD-10-CM

## 2020-10-12 DIAGNOSIS — U071 COVID-19: Secondary | ICD-10-CM | POA: Diagnosis not present

## 2020-10-12 DIAGNOSIS — Z841 Family history of disorders of kidney and ureter: Secondary | ICD-10-CM

## 2020-10-12 DIAGNOSIS — K861 Other chronic pancreatitis: Secondary | ICD-10-CM | POA: Diagnosis present

## 2020-10-12 DIAGNOSIS — J189 Pneumonia, unspecified organism: Secondary | ICD-10-CM

## 2020-10-12 DIAGNOSIS — A312 Disseminated mycobacterium avium-intracellulare complex (DMAC): Secondary | ICD-10-CM

## 2020-10-12 DIAGNOSIS — F1010 Alcohol abuse, uncomplicated: Secondary | ICD-10-CM | POA: Diagnosis not present

## 2020-10-12 DIAGNOSIS — R350 Frequency of micturition: Secondary | ICD-10-CM | POA: Diagnosis present

## 2020-10-12 DIAGNOSIS — Z9114 Patient's other noncompliance with medication regimen: Secondary | ICD-10-CM

## 2020-10-12 DIAGNOSIS — I471 Supraventricular tachycardia, unspecified: Secondary | ICD-10-CM

## 2020-10-12 DIAGNOSIS — Z8249 Family history of ischemic heart disease and other diseases of the circulatory system: Secondary | ICD-10-CM

## 2020-10-12 DIAGNOSIS — B20 Human immunodeficiency virus [HIV] disease: Secondary | ICD-10-CM

## 2020-10-12 DIAGNOSIS — Z21 Asymptomatic human immunodeficiency virus [HIV] infection status: Secondary | ICD-10-CM

## 2020-10-12 DIAGNOSIS — J439 Emphysema, unspecified: Secondary | ICD-10-CM | POA: Diagnosis present

## 2020-10-12 DIAGNOSIS — I4892 Unspecified atrial flutter: Secondary | ICD-10-CM | POA: Diagnosis present

## 2020-10-12 DIAGNOSIS — R451 Restlessness and agitation: Secondary | ICD-10-CM | POA: Diagnosis present

## 2020-10-12 DIAGNOSIS — F17210 Nicotine dependence, cigarettes, uncomplicated: Secondary | ICD-10-CM | POA: Diagnosis present

## 2020-10-12 DIAGNOSIS — Z79899 Other long term (current) drug therapy: Secondary | ICD-10-CM

## 2020-10-12 DIAGNOSIS — I700 Atherosclerosis of aorta: Secondary | ICD-10-CM | POA: Diagnosis present

## 2020-10-12 DIAGNOSIS — F1020 Alcohol dependence, uncomplicated: Secondary | ICD-10-CM | POA: Diagnosis present

## 2020-10-12 DIAGNOSIS — E222 Syndrome of inappropriate secretion of antidiuretic hormone: Secondary | ICD-10-CM | POA: Diagnosis present

## 2020-10-12 DIAGNOSIS — D7589 Other specified diseases of blood and blood-forming organs: Secondary | ICD-10-CM | POA: Diagnosis present

## 2020-10-12 DIAGNOSIS — R911 Solitary pulmonary nodule: Secondary | ICD-10-CM | POA: Diagnosis present

## 2020-10-12 DIAGNOSIS — F1410 Cocaine abuse, uncomplicated: Secondary | ICD-10-CM | POA: Diagnosis present

## 2020-10-12 DIAGNOSIS — E860 Dehydration: Secondary | ICD-10-CM | POA: Diagnosis present

## 2020-10-12 DIAGNOSIS — A310 Pulmonary mycobacterial infection: Secondary | ICD-10-CM | POA: Diagnosis present

## 2020-10-12 DIAGNOSIS — Z8611 Personal history of tuberculosis: Secondary | ICD-10-CM

## 2020-10-12 DIAGNOSIS — M545 Low back pain, unspecified: Secondary | ICD-10-CM

## 2020-10-12 DIAGNOSIS — Z9119 Patient's noncompliance with other medical treatment and regimen: Secondary | ICD-10-CM

## 2020-10-12 DIAGNOSIS — F1910 Other psychoactive substance abuse, uncomplicated: Secondary | ICD-10-CM | POA: Diagnosis present

## 2020-10-12 DIAGNOSIS — B159 Hepatitis A without hepatic coma: Secondary | ICD-10-CM | POA: Diagnosis present

## 2020-10-12 DIAGNOSIS — E875 Hyperkalemia: Secondary | ICD-10-CM | POA: Diagnosis not present

## 2020-10-12 DIAGNOSIS — F1210 Cannabis abuse, uncomplicated: Secondary | ICD-10-CM | POA: Diagnosis present

## 2020-10-12 DIAGNOSIS — E785 Hyperlipidemia, unspecified: Secondary | ICD-10-CM | POA: Diagnosis present

## 2020-10-12 DIAGNOSIS — B451 Cerebral cryptococcosis: Secondary | ICD-10-CM

## 2020-10-12 DIAGNOSIS — E440 Moderate protein-calorie malnutrition: Secondary | ICD-10-CM | POA: Diagnosis present

## 2020-10-12 DIAGNOSIS — G8929 Other chronic pain: Secondary | ICD-10-CM | POA: Diagnosis present

## 2020-10-12 DIAGNOSIS — I10 Essential (primary) hypertension: Secondary | ICD-10-CM | POA: Diagnosis present

## 2020-10-12 DIAGNOSIS — R0902 Hypoxemia: Secondary | ICD-10-CM | POA: Diagnosis present

## 2020-10-12 DIAGNOSIS — J301 Allergic rhinitis due to pollen: Secondary | ICD-10-CM | POA: Diagnosis present

## 2020-10-12 DIAGNOSIS — Z8661 Personal history of infections of the central nervous system: Secondary | ICD-10-CM

## 2020-10-12 DIAGNOSIS — H538 Other visual disturbances: Secondary | ICD-10-CM | POA: Diagnosis present

## 2020-10-12 DIAGNOSIS — D75A Glucose-6-phosphate dehydrogenase (G6PD) deficiency without anemia: Secondary | ICD-10-CM | POA: Diagnosis present

## 2020-10-12 LAB — BASIC METABOLIC PANEL
Anion gap: 9 (ref 5–15)
BUN: 10 mg/dL (ref 8–23)
CO2: 26 mmol/L (ref 22–32)
Calcium: 8.8 mg/dL — ABNORMAL LOW (ref 8.9–10.3)
Chloride: 91 mmol/L — ABNORMAL LOW (ref 98–111)
Creatinine, Ser: 1.15 mg/dL (ref 0.61–1.24)
GFR, Estimated: >60 mL/min (ref >60)
Glucose, Bld: 118 mg/dL — ABNORMAL HIGH (ref 70–99)
Potassium: 3.6 mmol/L (ref 3.5–5.1)
Sodium: 126 mmol/L — ABNORMAL LOW (ref 135–145)

## 2020-10-12 LAB — TROPONIN I (HIGH SENSITIVITY)
Troponin I (High Sensitivity): 12 ng/L (ref <18)
Troponin I (High Sensitivity): 11 ng/L (ref <18)

## 2020-10-12 LAB — CBC
HCT: 44.7 % (ref 39.0–52.0)
Hemoglobin: 15.3 g/dL (ref 13.0–17.0)
MCH: 34.7 pg — ABNORMAL HIGH (ref 26.0–34.0)
MCHC: 34.2 g/dL (ref 30.0–36.0)
MCV: 101.4 fL — ABNORMAL HIGH (ref 80.0–100.0)
Platelets: 237 10*3/uL (ref 150–400)
RBC: 4.41 MIL/uL (ref 4.22–5.81)
RDW: 11.9 % (ref 11.5–15.5)
WBC: 5.1 10*3/uL (ref 4.0–10.5)
nRBC: 0 % (ref 0.0–0.2)

## 2020-10-12 LAB — RESP PANEL BY RT-PCR (FLU A&B, COVID) ARPGX2
Influenza A by PCR: NEGATIVE
Influenza B by PCR: NEGATIVE
SARS Coronavirus 2 by RT PCR: POSITIVE — AB

## 2020-10-12 IMAGING — CR DG CHEST 2V
2 series · 2 of 2 positions shown · non-contrast
Comparison: [DATE] and [DATE].

CLINICAL DATA: Shortness of breath.

EXAM:
CHEST - 2 VIEW

[chest pa]
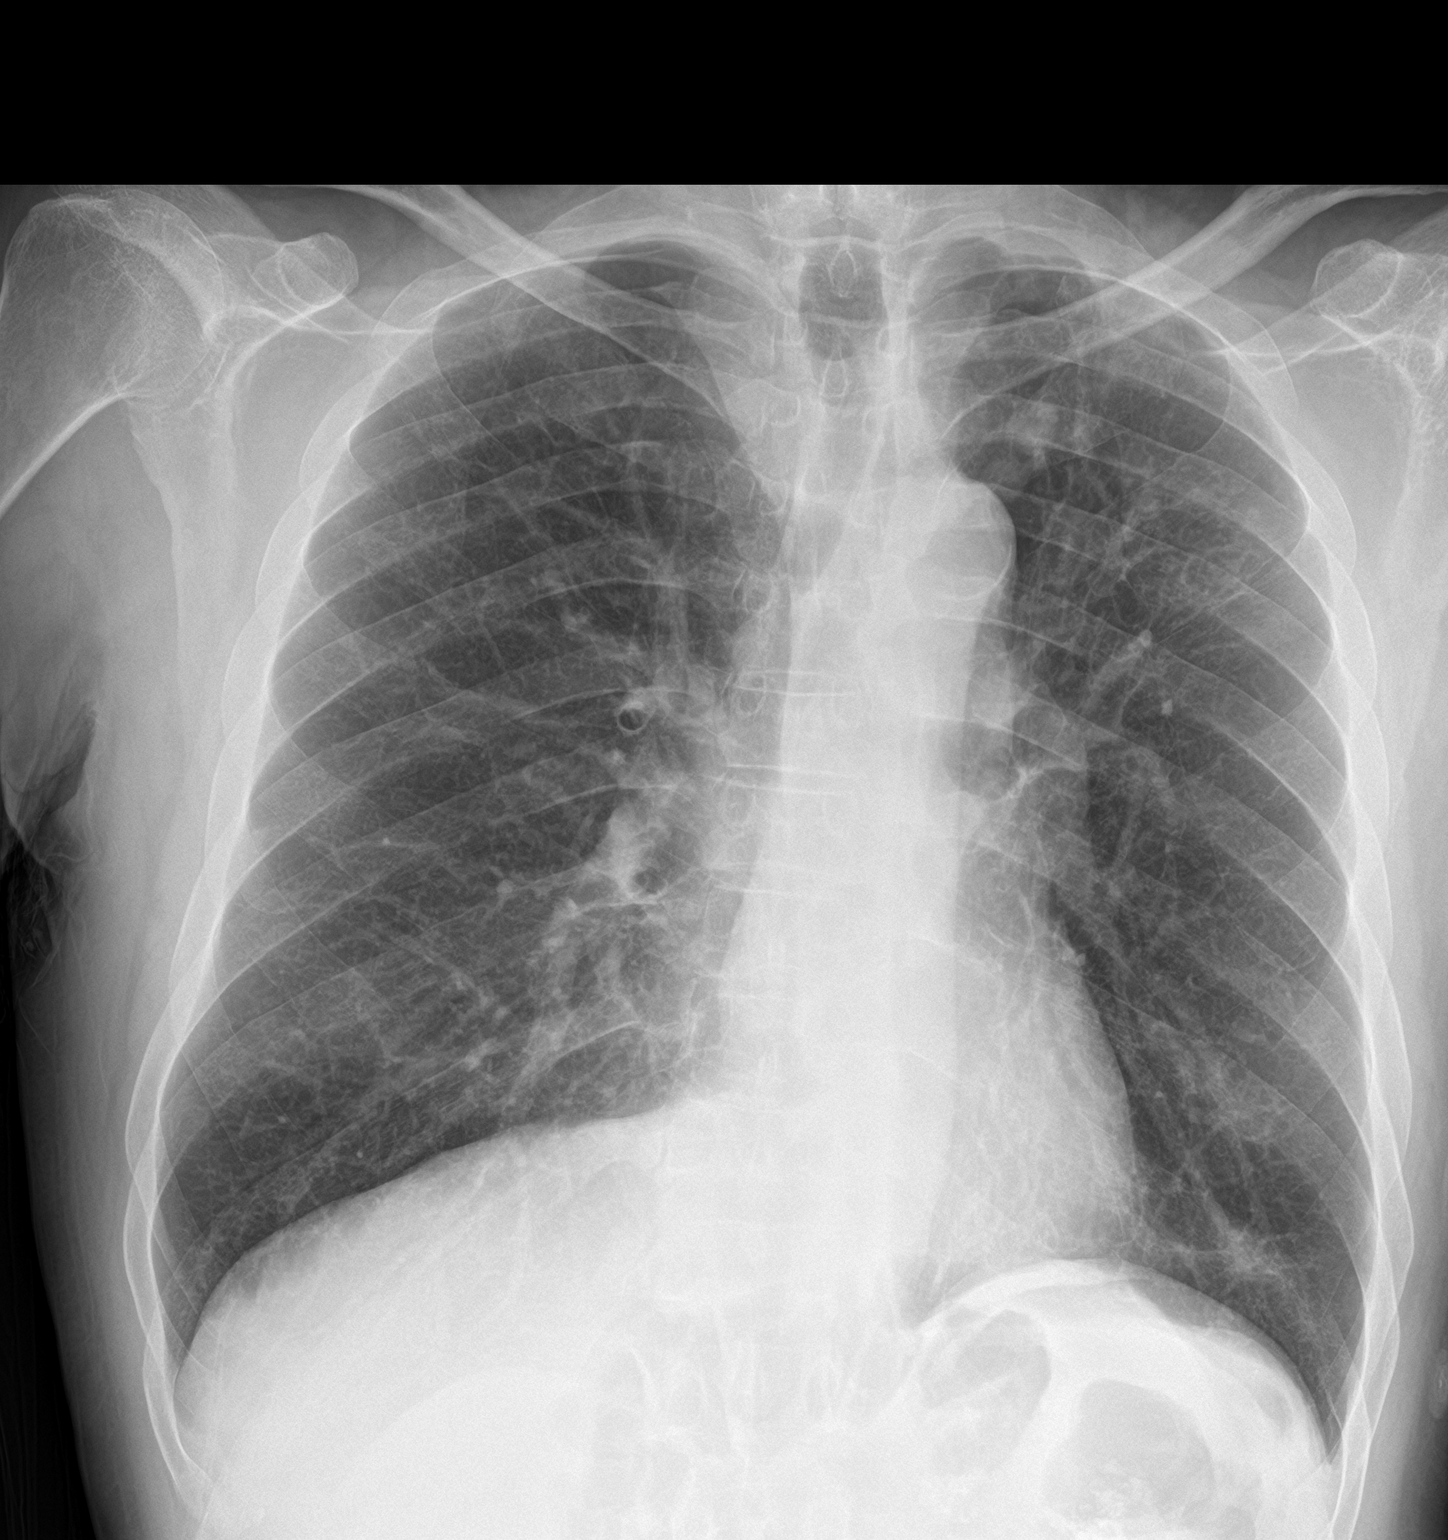

[chest lat]
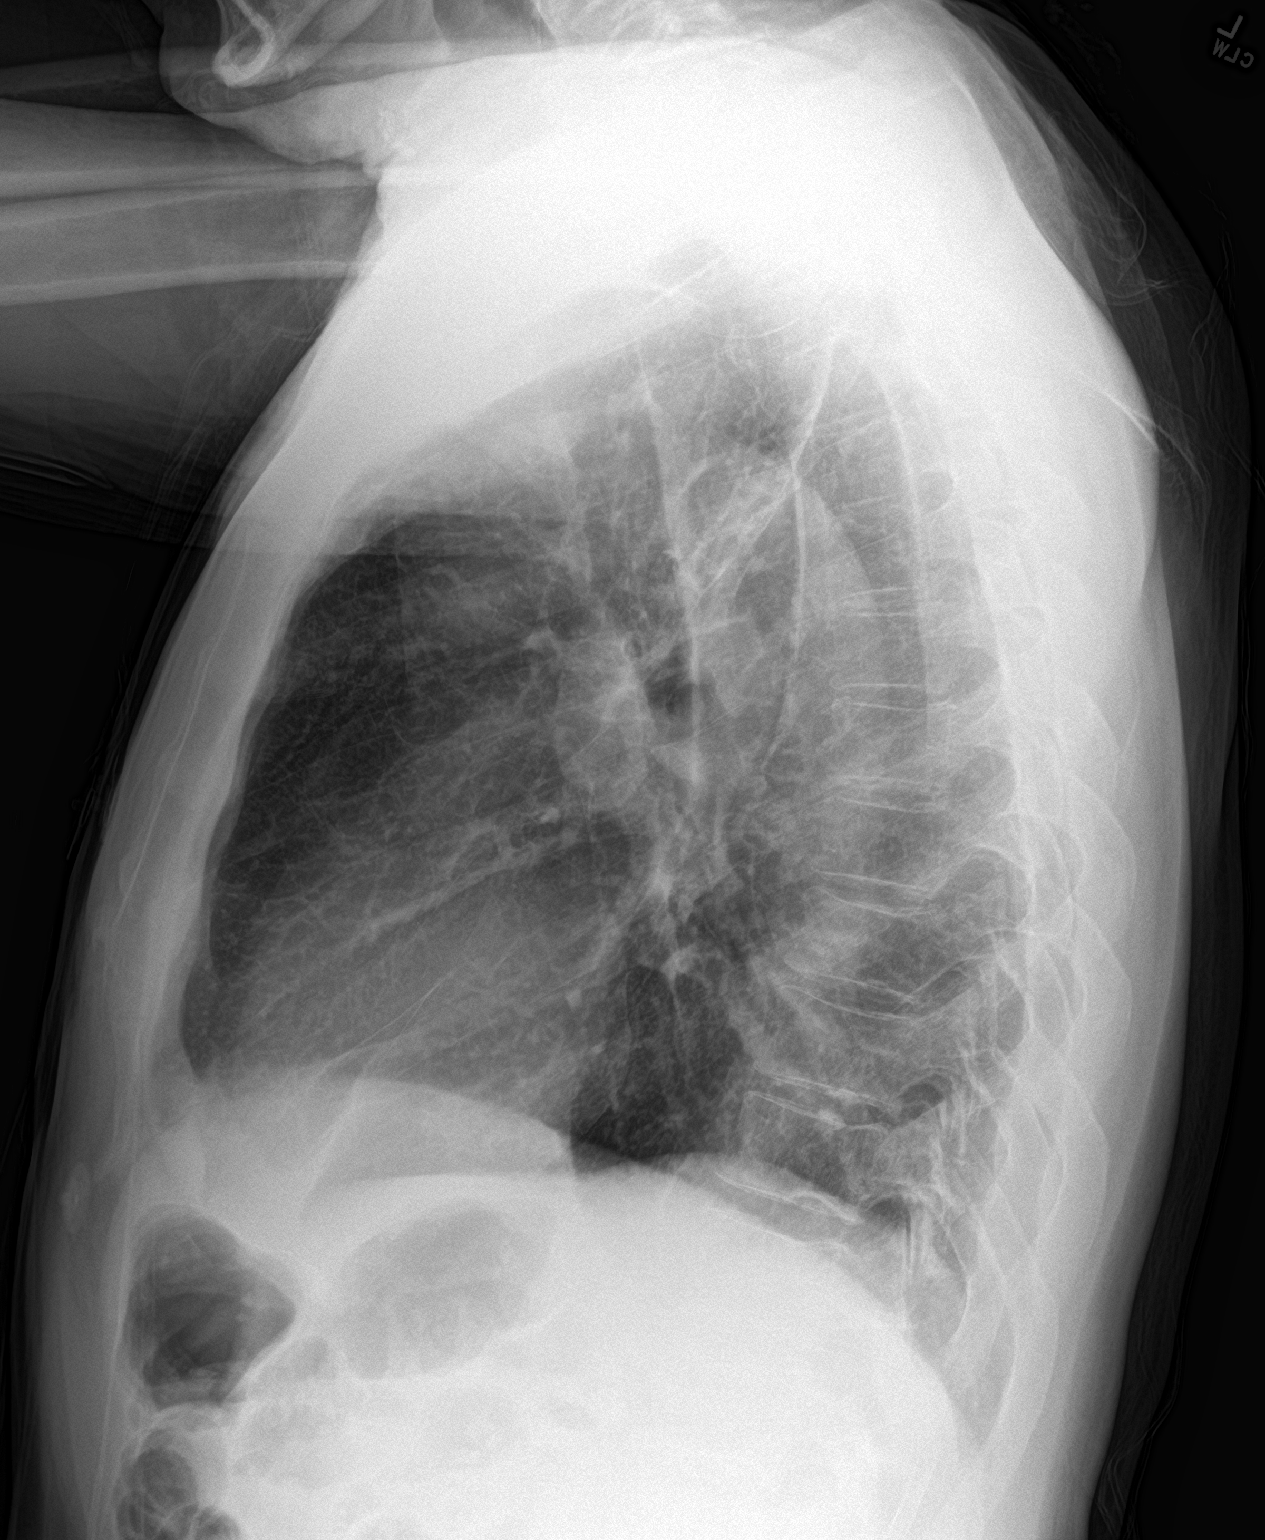

[2 of 2 positions shown; findings below may reference images not displayed]

FINDINGS: Stable cardiomediastinal contours. No pleural effusion or edema.
Previously described cystic and nodular opacities within the left
upper lobe are again noted corresponding to chronic atypical
infectious process. Not significantly changed when compared with
portable radiograph dated [DATE]. No acute superimposed airspace
opacities identified. The visualized osseous structures appear
intact.
IMPRESSION: 1. Persistent cystic and nodular opacities within the left upper
lobe. As described on previous CT, these findings are favored to
represent sequelae of atypical infection.
2. No acute superimposed airspace consolidation.

## 2020-10-12 IMAGING — CT CT ANGIO CHEST
2 of 6 series · 18 of 46 positions shown · IV contrast (APPLIED)
Comparison: Chest CT dated [DATE]

CLINICAL DATA: Shortness of breath

EXAM:
CT ANGIOGRAPHY CHEST WITH CONTRAST
TECHNIQUE: Multidetector CT imaging of the chest was performed using the
standard protocol during bolus administration of intravenous
contrast. Multiplanar CT image reconstructions and MIPs were
obtained to evaluate the vascular anatomy.
CONTRAST:  40mL OMNIPAQUE IOHEXOL 350 MG/ML SOLN

[Series 7: thins · axial · 0.71mm/px · z∈[+1078,+1367]mm · 15 of 452 slices shown]
[im 20/452  lung]
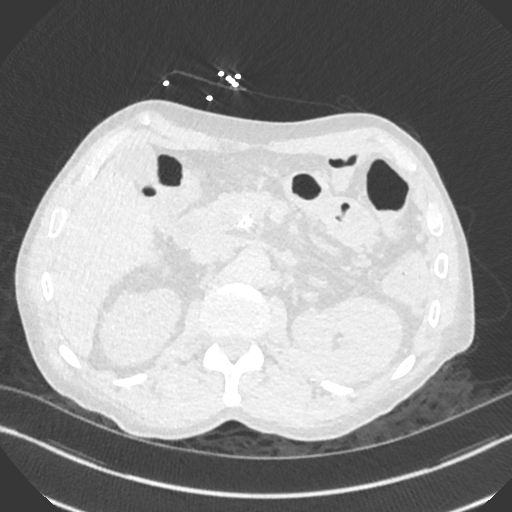
[im 59/452  soft-tissue]
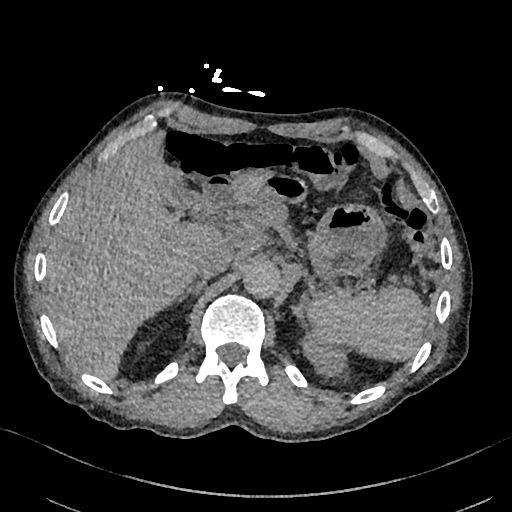
[im 79/452  lung]
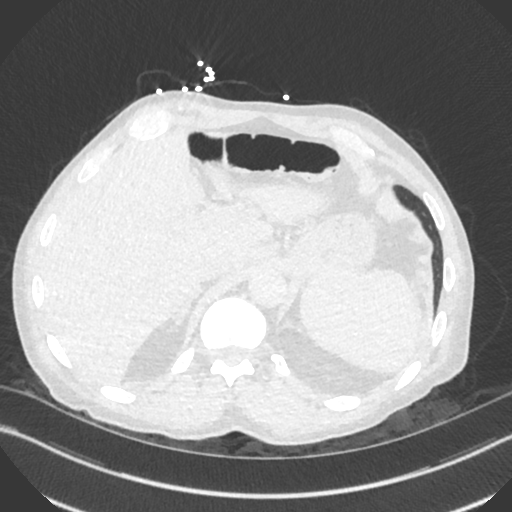
[im 118/452  soft-tissue]
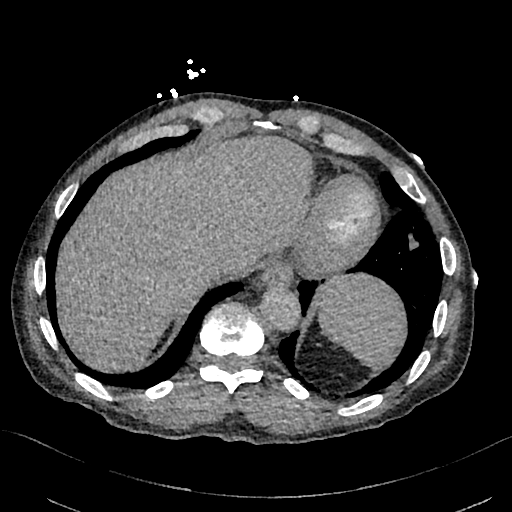
[im 138/452  lung]
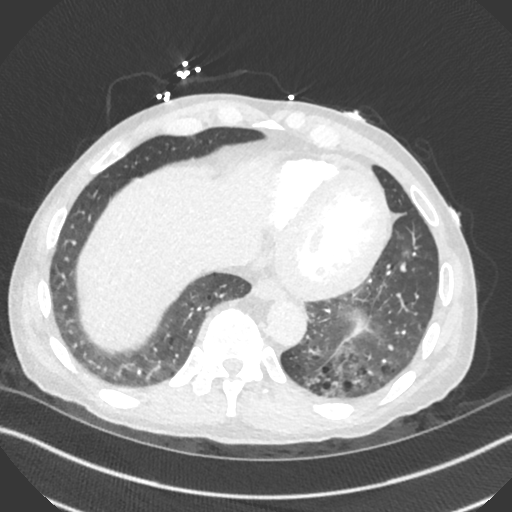
[im 177/452  soft-tissue]
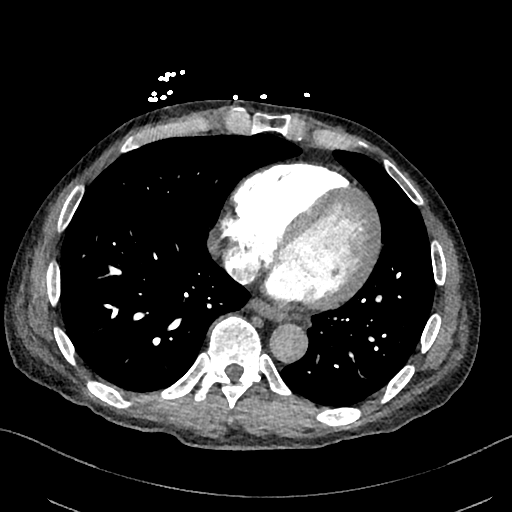
[im 197/452  lung]
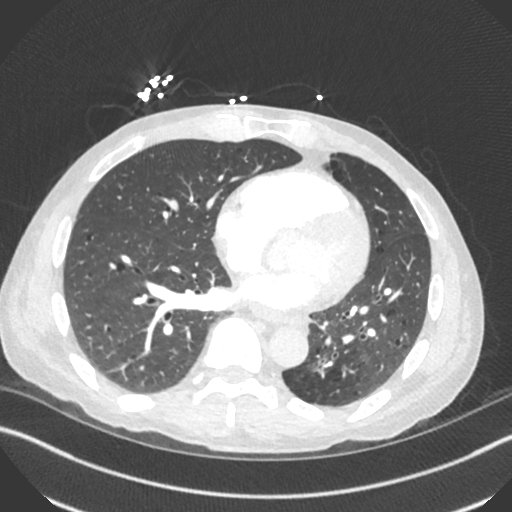
[im 236/452  soft-tissue]
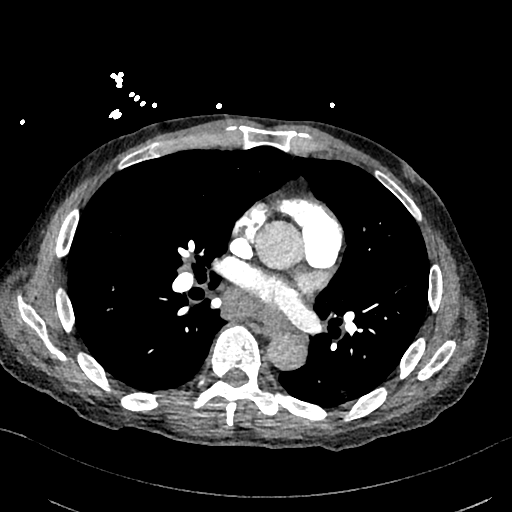
[im 255/452  lung]
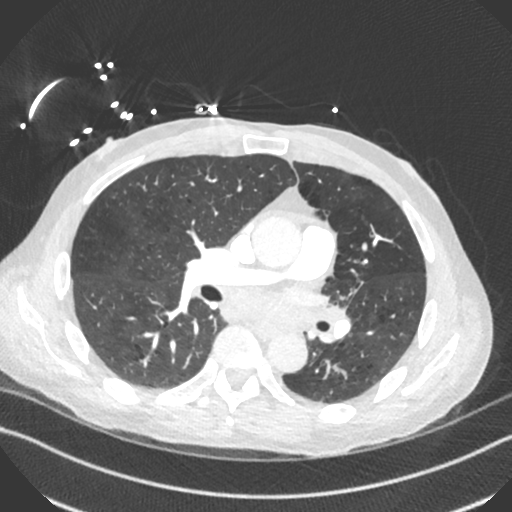
[im 275/452  soft-tissue]
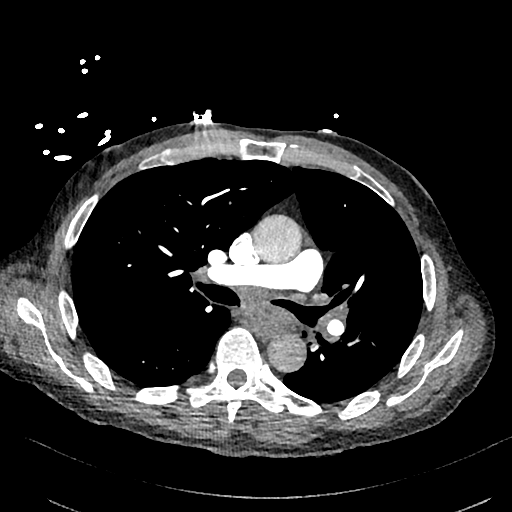
[im 314/452  lung]
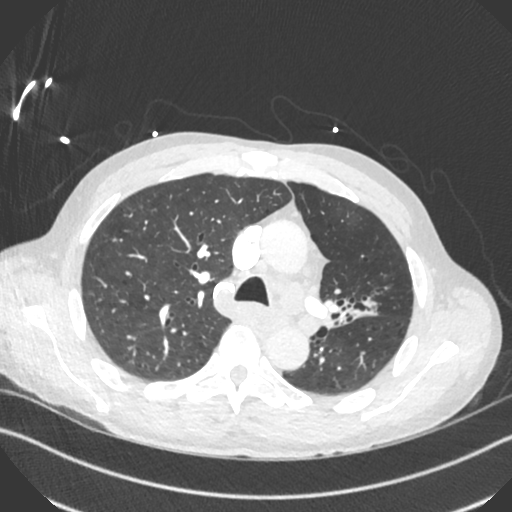
[im 334/452  soft-tissue]
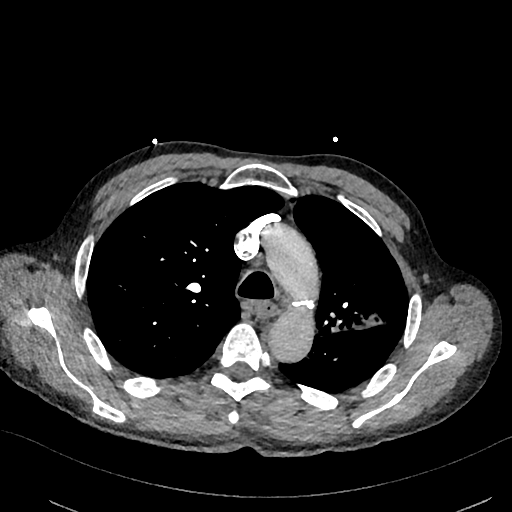
[im 373/452  lung]
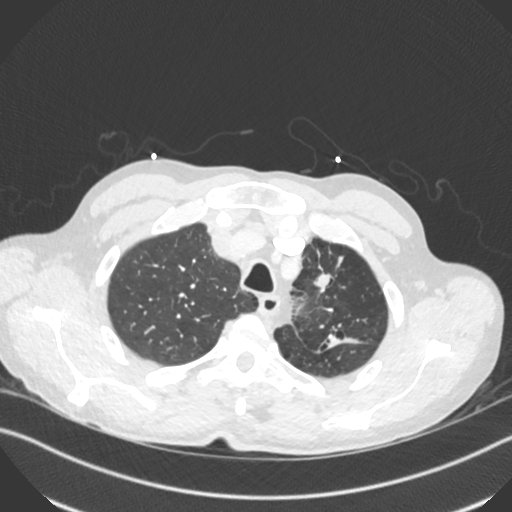
[im 393/452  soft-tissue]
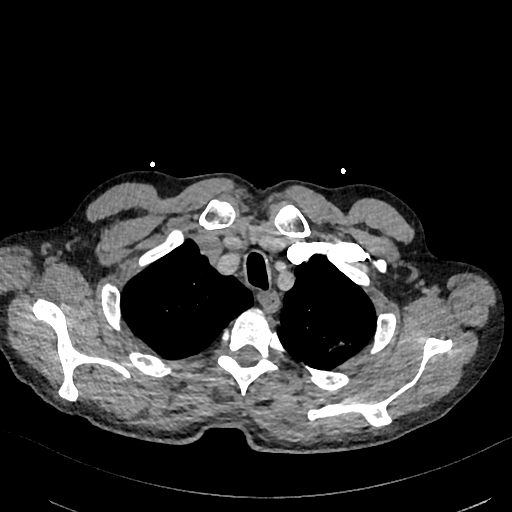
[im 432/452  lung]
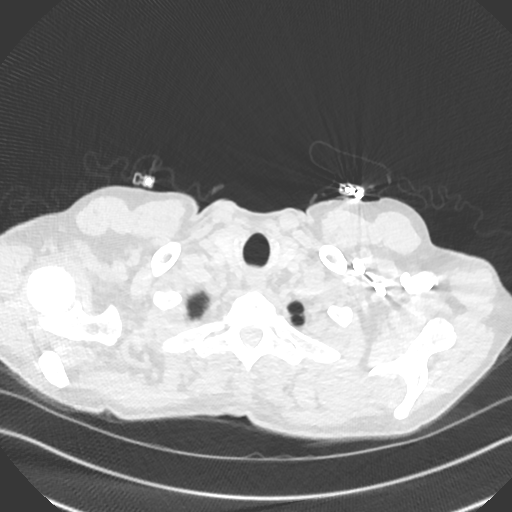

[Series 8: cor · coronal · 0.66mm/px · 3 of 138 slices shown]
[im 35/138  soft-tissue]
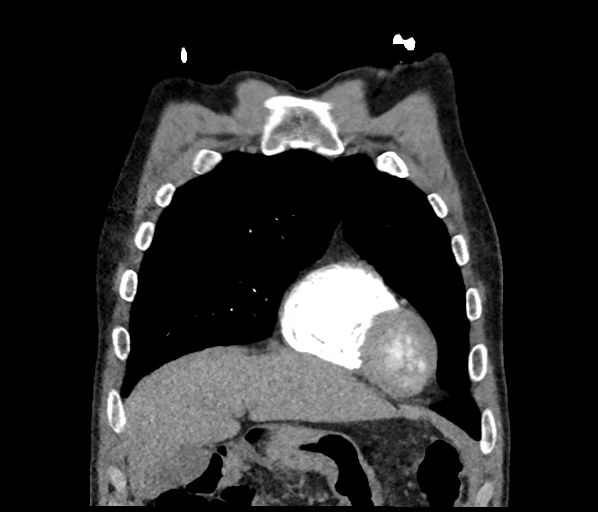
[im 69/138  soft-tissue]
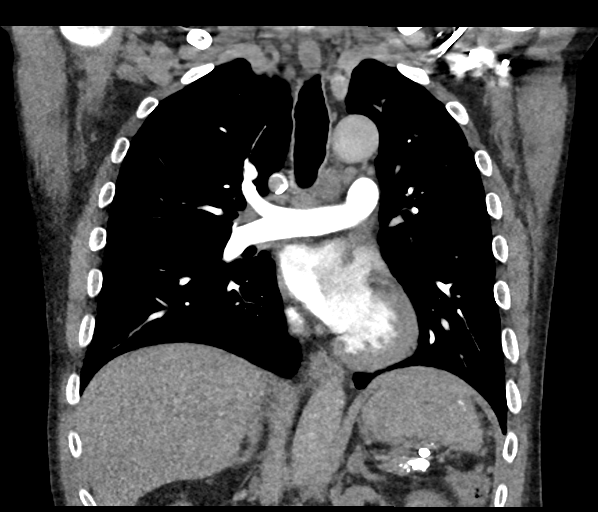
[im 103/138  soft-tissue]
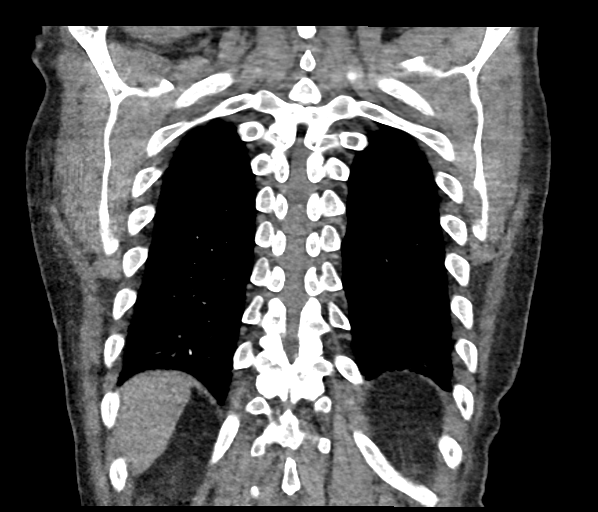

[18 of 46 positions shown; findings below may reference images not displayed]

FINDINGS: Cardiovascular: Normal heart size. No pericardial effusion. Adequate
contrast opacification of the pulmonary arteries. No evidence of
pulmonary embolus. Atherosclerotic disease of the thoracic aorta.

Mediastinum/Nodes: Increased mediastinal adenopathy. Preference
subcarinal lymph node measures 2.5 cm in short axis, previously 1.3.
Reference AP window lymph node measures 1.6 cm in short axis,
previously 0.7. Unchanged bilateral hilar lymph nodes. Reference
left hilar lymph node measuring 1.2 cm in short axis, previously
cm.

Lungs/Pleura: Central airways are patent. Centrilobular and
paraseptal emphysema. Nodular and cavitary areas of airspace
consolidation in the left upper and lower lobes, overall decreased
compared to prior exam. Although there are new nodular opacities in
the left lower lobe. Reference nodule measuring up to 1.3 cm on
series 7, image 335, previously 0.6 cm. Unchanged bronchiectasis of
the left upper lobe, likely due to prior infection.

Upper Abdomen: Numerous calcifications of the pancreas, likely
sequela of chronic pancreatitis. Layering sludge versus stones seen
in the gallbladder. No acute findings.

Musculoskeletal: No chest wall abnormality. No acute or significant
osseous findings.

Review of the MIP images confirms the above findings.
IMPRESSION: No evidence of pulmonary embolus.

Nodular opacities of the left lung are overall improved when
compared with prior CT, although there are some new areas of
nodularity in the left lower lobe. Findings are compatible with
chronic atypical infection such as nontuberculous mycobacterial.

Mediastinal lymph nodes are increased in size compared to prior
exam, these may be reactive given presence of known infection.
Recommend follow-up chest CT in 3 months.

Aortic Atherosclerosis ([R2]-[R2]) and Emphysema ([R2]-[R2]).

## 2020-10-12 MED ORDER — SODIUM CHLORIDE 0.9 % IV SOLN
200.0000 mg | Freq: Once | INTRAVENOUS | Status: AC
  Administered 2020-10-13: 200 mg via INTRAVENOUS
  Filled 2020-10-12: qty 40

## 2020-10-12 MED ORDER — THIAMINE HCL 100 MG/ML IJ SOLN
100.0000 mg | Freq: Once | INTRAMUSCULAR | Status: AC
  Administered 2020-10-12: 100 mg via INTRAVENOUS
  Filled 2020-10-12: qty 2

## 2020-10-12 MED ORDER — ACETAMINOPHEN 325 MG PO TABS
650.0000 mg | ORAL_TABLET | Freq: Four times a day (QID) | ORAL | Status: DC | PRN
Start: 2020-10-12 — End: 2020-10-13
  Administered 2020-10-13: 650 mg via ORAL
  Filled 2020-10-12: qty 2

## 2020-10-12 MED ORDER — ACETAMINOPHEN 325 MG PO TABS
650.0000 mg | ORAL_TABLET | Freq: Once | ORAL | Status: AC
  Administered 2020-10-12: 650 mg via ORAL
  Filled 2020-10-12: qty 2

## 2020-10-12 MED ORDER — IOHEXOL 350 MG/ML SOLN
40.0000 mL | Freq: Once | INTRAVENOUS | Status: AC | PRN
  Administered 2020-10-12: 40 mL via INTRAVENOUS

## 2020-10-12 MED ORDER — FOLIC ACID 5 MG/ML IJ SOLN
1.0000 mg | Freq: Every day | INTRAMUSCULAR | Status: DC
  Administered 2020-10-12 – 2020-10-15 (×4): 1 mg via INTRAVENOUS
  Filled 2020-10-12 (×4): qty 0.2

## 2020-10-12 MED ORDER — VANCOMYCIN HCL 1500 MG/300ML IV SOLN: 1500.0000 mg | INTRAVENOUS | Status: DC

## 2020-10-12 MED ORDER — SODIUM CHLORIDE 0.9 % IV SOLN
2.0000 g | Freq: Three times a day (TID) | INTRAVENOUS | Status: DC
  Administered 2020-10-13 (×2): 2 g via INTRAVENOUS
  Filled 2020-10-12 (×2): qty 2

## 2020-10-12 MED ORDER — SODIUM CHLORIDE 0.9 % IV SOLN
2.0000 g | Freq: Once | INTRAVENOUS | Status: AC
  Administered 2020-10-12: 2 g via INTRAVENOUS
  Filled 2020-10-12: qty 2

## 2020-10-12 MED ORDER — VANCOMYCIN HCL 1500 MG/300ML IV SOLN
1500.0000 mg | Freq: Once | INTRAVENOUS | Status: AC
  Administered 2020-10-12: 1500 mg via INTRAVENOUS
  Filled 2020-10-12: qty 300

## 2020-10-12 MED ORDER — SODIUM CHLORIDE 0.9 % IV SOLN
100.0000 mg | Freq: Every day | INTRAVENOUS | Status: AC
Start: 2020-10-13 — End: 2020-10-16
  Administered 2020-10-13 – 2020-10-16 (×4): 100 mg via INTRAVENOUS
  Filled 2020-10-12 (×2): qty 100
  Filled 2020-10-12: qty 20
  Filled 2020-10-12: qty 100

## 2020-10-12 MED ORDER — ENOXAPARIN SODIUM 40 MG/0.4ML IJ SOSY
40.0000 mg | PREFILLED_SYRINGE | Freq: Every day | INTRAMUSCULAR | Status: DC
  Administered 2020-10-13: 40 mg via SUBCUTANEOUS
  Filled 2020-10-12: qty 0.4

## 2020-10-12 NOTE — ED Provider Notes (Signed)
[facility]ENCY DEPARTMENT Provider Note   CSN: [id number] Arrival date & time: 10/12/20  1124     History Chief Complaint  Patient presents with   Shortness of Breath   Chest Pain    Ricky [name] is a 65 y.o. male with PMH HIV, MAC, SVT status post ablation who presents the emergency department for evaluation of fever, cough, fatigue.  Patient states that he has been having chest pain and shortness of breath for the last month and has not been taking his HIV medication for a long time.  He endorses intermittent palpitations where he feels that his "heart races".  Currently denies abdominal pain, nausea, vomiting, headache or additional systemic symptoms.  He endorses cough with significant mucus production.  He arrives with a low-grade fever of 100.1 and saturating 92-99% on room air.   Shortness of Breath Associated symptoms: chest pain   Associated symptoms: no abdominal pain, no cough, no ear pain, no fever, no rash, no sore throat and no vomiting   Chest Pain Associated symptoms: fatigue and shortness of breath   Associated symptoms: no abdominal pain, no back pain, no cough, no fever, no palpitations and no vomiting       Past Medical History:  Diagnosis Date   Human immunodeficiency virus (HIV) disease ([place]) 03/26/2020    Patient Active Problem List   Diagnosis Date Noted   Mycobacterium avium infection ([place])    Blurry vision, left eye    AKI (acute kidney injury) ([place])    Cavitary lesion of lung    Acute nonintractable headache    Smoking    Cocaine use    SVT (supraventricular tachycardia) ([facility])    Abnormal EKG    Atrial flutter ([place]) 03/27/2020   Aortic atherosclerosis ([place]) 03/27/2020   Hypertension 03/27/2020   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   Leukocytopenia 03/26/2020   Macrocytosis 03/26/2020   Moderate protein malnutrition ([place]) 03/26/2020   Hypoalbuminemia 03/26/2020   AIDS (acquired immune deficiency syndrome) ([place])  03/26/2020    Past Surgical History:  Procedure Laterality Date   SVT ABLATION N/A 04/23/2020   Procedure: SVT ABLATION;  Surgeon: Evans Lance, MD;  Location: [facility];  Service: Cardiovascular;  Laterality: N/A;       Family History  Problem Relation Age of Onset   Hypertension Mother    Kidney disease Mother    Hypertension Sister    Hypertension Brother     Social History   Tobacco Use   Smoking status: Every Day    Types: Cigarettes   Smokeless tobacco: Former  Substance Use Topics   Alcohol use: Yes    Alcohol/week: 14.0 - 21.0 standard drinks    Types: 14 - 21 Cans of beer per week   Drug use: Not Currently    Home Medications Prior to Admission medications   Medication Sig Start Date End Date Taking? Authorizing Provider  acetaminophen (TYLENOL) 325 MG tablet Take 2 tablets (650 mg total) by mouth every 4 (four) hours as needed for fever or headache. 04/25/20   Arrien, Jimmy Picket, MD  atovaquone [facility]) 750 MG/5ML suspension Take 10 mLs (1,500 mg total) by mouth daily. 04/18/20   Thurnell Lose, MD  azithromycin (ZITHROMAX) 500 MG tablet Take 1 tablet (500 mg total) by mouth daily. Take 1 tablet daily. 04/17/20   Thurnell Lose, MD  benzonatate (TESSALON) 100 MG capsule Take 1 capsule (100 mg total) by mouth every 8 (eight) hours. 03/21/20  Domenic Moras, PA-C  ethambutol (MYAMBUTOL) 400 MG tablet Take 3 tablets (1,200 mg total) by mouth daily. 04/17/20   Thurnell Lose, MD  metoprolol tartrate (LOPRESSOR) 25 MG tablet Take 1 tablet (25 mg total) by mouth 2 (two) times daily. 04/25/20 05/25/20  Arrien, Jimmy Picket, MD  metoprolol tartrate (LOPRESSOR) 25 MG tablet TAKE 1 TABLET (25 MG TOTAL) BY MOUTH TWO TIMES DAILY. 04/25/20 04/25/21  Arrien, Jimmy Picket, MD  pravastatin (PRAVACHOL) 20 MG tablet Take 1 tablet (20 mg total) by mouth every evening. 04/25/20 05/25/20  Arrien, Jimmy Picket, MD  pravastatin (PRAVACHOL) 20 MG tablet TAKE 1 TABLET  (20 MG TOTAL) BY MOUTH EVERY EVENING. 04/25/20 04/25/21  Arrien, Jimmy Picket, MD  rifabutin (MYCOBUTIN) 150 MG capsule Take 2 capsules (300 mg total) by mouth daily. 04/18/20   Thurnell Lose, MD  rifabutin (MYCOBUTIN) 150 MG capsule TAKE 2 CAPSULES (300 MG TOTAL) BY MOUTH DAILY. 04/17/20 04/17/21  Thurnell Lose, MD    Allergies    Bactrim [sulfamethoxazole-trimethoprim]  Review of Systems   Review of Systems  Constitutional:  Positive for fatigue. Negative for chills and fever.  HENT:  Negative for ear pain and sore throat.   Eyes:  Negative for pain and visual disturbance.  Respiratory:  Positive for shortness of breath. Negative for cough.   Cardiovascular:  Positive for chest pain. Negative for palpitations.  Gastrointestinal:  Negative for abdominal pain and vomiting.  Genitourinary:  Negative for dysuria and hematuria.  Musculoskeletal:  Negative for arthralgias and back pain.  Skin:  Negative for color change and rash.  Neurological:  Negative for seizures and syncope.  All other systems reviewed and are negative.  Physical Exam Updated Vital Signs BP 118/80   Pulse 67   Temp 100.1 F (37.8 C)   Resp (!) 22   Ht _0  (1.854 m)   SpO2 92%   BMI 21.78 kg/m   Physical Exam Vitals and nursing note reviewed.  Constitutional:      Appearance: He is well-developed.  HENT:     Head: Normocephalic and atraumatic.  Eyes:     Conjunctiva/sclera: Conjunctivae normal.  Cardiovascular:     Rate and Rhythm: Normal rate and regular rhythm.     Heart sounds: No murmur heard. Pulmonary:     Effort: Pulmonary effort is normal. No respiratory distress.     Breath sounds: Normal breath sounds.  Abdominal:     Palpations: Abdomen is soft.     Tenderness: There is no abdominal tenderness.  Musculoskeletal:     Cervical back: Neck supple.  Skin:    General: Skin is warm and dry.  Neurological:     Mental Status: He is alert.    ED Results / Procedures / Treatments    Labs (all labs ordered are listed, but only abnormal results are displayed) Labs Reviewed  RESP PANEL BY RT-PCR (FLU A&B, COVID) ARPGX2 - Abnormal; Notable for the following components:      Result Value   SARS Coronavirus 2 by RT PCR POSITIVE (*)    All other components within normal limits  BASIC METABOLIC PANEL - Abnormal; Notable for the following components:   Sodium 126 (*)    Chloride 91 (*)    Glucose, Bld 118 (*)    Calcium 8.8 (*)    All other components within normal limits  CBC - Abnormal; Notable for the following components:   MCV 101.4 (*)    MCH 34.7 (*)  All other components within normal limits  CULTURE, BLOOD (ROUTINE X 2)  CULTURE, BLOOD (ROUTINE X 2)  CD4/CD8 (T-HELPER/T-SUPPRESSOR CELL)  TROPONIN I (HIGH SENSITIVITY)  TROPONIN I (HIGH SENSITIVITY)    EKG None  Radiology DG Chest 2 View  Result Date: 10/12/2020 CLINICAL DATA:  Shortness of breath. EXAM: CHEST - 2 VIEW COMPARISON:  03/27/2020 and 04/19/2020. FINDINGS: Stable cardiomediastinal contours. No pleural effusion or edema. Previously described cystic and nodular opacities within the left upper lobe are again noted corresponding to chronic atypical infectious process. Not significantly changed when compared with portable radiograph dated 04/19/2020. No acute superimposed airspace opacities identified. The visualized osseous structures appear intact. IMPRESSION: 1. Persistent cystic and nodular opacities within the left upper lobe. As described on previous CT, these findings are favored to represent sequelae of atypical infection. 2. No acute superimposed airspace consolidation. Electronically Signed   By: Kerby Moors M.D.   On: 10/12/2020 12:24    Procedures Procedures   Medications Ordered in ED Medications  acetaminophen (TYLENOL) tablet 650 mg (650 mg Oral Given 10/12/20 1137)  iohexol (OMNIPAQUE) 350 MG/ML injection 40 mL (40 mLs Intravenous Contrast Given 10/12/20 1514)    ED Course  I have  reviewed the triage vital signs and the nursing notes.  Pertinent labs & imaging results that were available during my care of the patient were reviewed by me and considered in my medical decision making (see chart for details).    MDM Rules/Calculators/A&P                           Patient seen the emergency department for evaluation of fatigue, fever, cough.  Physical exam largely unremarkable.  Laboratory evaluation with a new hyponatremia to 126, hypochloremia to 91, CBC unremarkable, high-sensitivity troponin negative.  Chest x-ray with persistent nodular and cystic opacities in the left upper lobe but no acute superimposed infectious process.  In the setting of fever in a patient with a previous history of Mycobacterium avium complex, chest pain and new hyponatremia, patient will likely require admission for further work-up.  Blood cultures obtained, CD4 count obtained and CT PE is currently pending.  Patient signed out to oncoming provider.  Please see provider signout note for continuation of work-up.  Broad-spectrum antibiotics initiated. Final Clinical Impression(s) / ED Diagnoses Final diagnoses:  [id number]    Rx / DC Orders ED Discharge Orders     None        Shada Nienaber, MD 10/12/20 1645

## 2020-10-12 NOTE — ED Notes (Signed)
Pt updated on plan of care of admission. Pt receptive of information.Pt in room talking on the phone updating family.

## 2020-10-12 NOTE — Progress Notes (Signed)
Pharmacy Antibiotic Note  Ricky [name] is a 65 y.o. male admitted on 10/12/2020 with sepsis.  Pharmacy has been consulted for Cefepime and Vancomycin dosing.  WBC 5.1, Tmax 1001F SCr 1.15 - baseline  Plan: Cefepime 2gm IV every 8 hours Give vancomycin 1500 mg IV x1 Vancomycin 1500 mg IV every 24 hours.  Goal trough 15-20 mcg/mL. eAUC 467.9, SCr used 1.15, Cmax 37.2 Monitor renal function, CBC, cultures/sensitivities and de-escalate as able  Height: '6\' 1"'$  (185.4 cm) IBW/kg (Calculated) : 79.9  Temp (24hrs), Avg:100.1 F (37.8 C), Min:100.1 F (37.8 C), Max:100.1 F (37.8 C)  Recent Labs  Lab 10/12/20 1135  WBC 5.1  CREATININE 1.15    CrCl cannot be calculated (Unknown ideal weight.).    Allergies  Allergen Reactions   Bactrim [Sulfamethoxazole-Trimethoprim] Other (See Comments)    Patient was trialed on DS TIW and SS daily for PJP prophylaxis and developed hyperkalemia and increased Scr    Antimicrobials this admission: Cefepime 9/9 >>  Vancomycin 9/9 >>   Dose adjustments this admission: none  Microbiology results: 9/9 BCx: pending  Thank you for allowing pharmacy to be a part of this patient's care.  Laurey Arrow, PharmD PGY1 Pharmacy Resident 10/12/2020  4:39 PM  Please check [email or web address] for unit-specific pharmacy phone numbers.

## 2020-10-12 NOTE — ED Notes (Signed)
Pt in room watching tv. ABX completed. Introduced self to pt. Given meal and drink. Pt updated on plan of care.

## 2020-10-12 NOTE — ED Provider Notes (Signed)
Patient received at handoff.  See previous physician note for further details.  65 yo male, HIV, not taking antivirals, daily alcohol abuse.Ricky [name] ER secondary to chest pain, dyspnea, cough, fever.  Hypoxic on arrival.  Also with subjective fevers and chills prior to arrival.  No home oxygen.  Physical Exam  BP 123/85   Pulse 74   Temp 100.1 F (37.8 C)   Resp 14   Ht _0  (1.854 m)   SpO2 93%   BMI 21.78 kg/m   Physical Exam Vitals and nursing note reviewed.  Constitutional:      General: He is not in acute distress.    Appearance: He is well-developed.  HENT:     Head: Normocephalic and atraumatic.     Right Ear: External ear normal.     Left Ear: External ear normal.     Mouth/Throat:     Mouth: Mucous membranes are moist.  Eyes:     General: No scleral icterus. Cardiovascular:     Rate and Rhythm: Normal rate and regular rhythm.     Pulses: Normal pulses.     Heart sounds: Normal heart sounds.  Pulmonary:     Effort: Pulmonary effort is normal. Tachypnea present. No respiratory distress.     Breath sounds: Decreased breath sounds present.     Comments: Adventitious breath sounds b/l Abdominal:     General: Abdomen is flat.     Palpations: Abdomen is soft.     Tenderness: There is no abdominal tenderness.  Musculoskeletal:        General: Normal range of motion.     Cervical back: Normal range of motion.     Right lower leg: No edema.     Left lower leg: No edema.  Skin:    General: Skin is warm and dry.     Capillary Refill: Capillary refill takes less than 2 seconds.  Neurological:     Mental Status: He is alert and oriented to person, place, and time.  Psychiatric:        Mood and Affect: Mood normal.        Behavior: Behavior normal.    ED Course/Procedures     Procedures  MDM   This patient complains of cough, dyspnea, chills, fever; this involves an extensive number of treatment Options and is a complaint that carries with it a high risk  of complications and Morbidity.  Vital signs reviewed.  Serious etiologies considered.    Patient immunocompromised, HIV not on antivirals.  Acute respiratory complaints.  Concern for pulmonary infection. Broad spectrum abx started by previous team.   Labs reviewed.  Patient is positive COVID-19.  Transiently hypoxic.  CT imaging ordered by previous provider.  CP without evidence of acute PE.  There is concern for pulmonary infection.  Patient with history of MAC. Not taking HIV anti-virals.   Mildly hyponatremic on BMP, IVF. Consider legionella testing.  Chronic alcohol abuse, does not believe he is in acute withdrawal at this time.  CIWA protocol ordered.  Patient COVID-19, immunocompromise, possible superimposed bacterial or atypical infection to the lungs.  Recommend admission. Pt agreeable. D/w admitting team who accepts pt.      Jeanell Sparrow, DO 10/13/20 0010

## 2020-10-12 NOTE — H&P (Addendum)
[name] Admission History and Physical Service Pager: [phone]  Patient name: Ricky [name] Medical record number: [id number] Date of birth: 1955-09-06 Age: 65 y.o. Gender: male  Primary Care Provider: Pcp, No Consultants: ID, pulm? Code Status: Full Preferred Emergency Contact: [name], [name] [phone] Contact Information     Name Relation Home Work Mobile   [name], [name] [phone]  [phone]     Chief Complaint: Fever and chest pain   Assessment and Plan: [name] is a 65 y.o. male presenting with cough onset fever and chest pain. PMH is significant for history of cryptococcal meningitis, HIV AIDS, MAC, SVT status post unsuccessful ablation, CKD, HTN, dyslipidemia, calorie protein malnutrition   Fever, fatigue, and chest pain likely 2/2 to COVID-19 vs other infectious cause due to immunocompromised state  Patient had onset of fever and chest pain that began about one month ago and had increasing fatigue over the past few days that caused him to come into the ED.  Pertinent labs/imaging obtained in the ED are as follows: Trops trended flat, hemoglobin of 15.3, COVID positive, sodium 126, CTAP showed no evidence of pulmonary embolus. Nodular opacities of the left lung are overall improved when compared with prior CT, although there are some new areas of nodularity in the left lower lobe. 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 Chest x-ray showed persistent cystic and nodular opacities within the left upper lobe. EKG NSR without any acute ST segment changes. Current symptoms could likely be due to his present COVID-19 virus.  However, due to his immunocompromised state we have a broad differential. Pneumonia is still possibility though the chest x-ray does not seem to support this.  The patient is hyponatremic which can suggest Legionella though he has a history of hyponatremia  as well as alcoholism which is more likely the cause of his chronic hyponatremia.  Differential includes but is not limited to bacterial and fungal opportunistic PNAs.  These are less likely due to improvement seen on imaging compared to his previous on last admission. Symptoms do not seem to have cardiac etiology as ACS was ruled out. Overall, symptoms appear infectious in nature and not related to possible PE and CHF. Pt also has MAC that could be contributing to the findings given immunocompromised state and his lack of adherence to medication regimen (discussed below).  He was started on cefepime and vancomycin for concern of possible bacterial pneumonia while in the emergency department.  We will continue antibiotics started in the emergency department plan to consult infectious disease in the morning for further recommendations. -Admit to med-tele, Attending Dr. Ardelia Mems  -VS per floor protocol  -Continuous cardiac and pulse oximetry monitoring  -CBC/CMP/Creatinine ordered  -UA pending  -Airborne and contact precautions  -Tylenol 620m q6hr PRN  -Cefepime and vancomycin ordered  -Remdesivir ordered -Blood culture pending  -Incentive spirometry, flutter valve   -Social work consult  COVID-19 Patient was COVID-positive in January 2022.  He tested positive for COVID-19 again today.  Denies any sick contacts. Current symptoms include severe fatigue, chills, cough with sputum production.  -Plan as above  -Monitor fever   HIV  Previously the patient [place] by infectious disease in [place]  He has been off of ART for 2 to 4 years.  Last CD4 count was less than 35 (2%) with a viral load of 35,500 copies.  Prophylaxis has been complicated by multiple problems including drug to drug interactions, electrolyte abnormalities  and decreased kidney function as well as a low G6PD level.  Bactrim was stopped because of increased potassium and creatinine and atovaquone was initiated with  rifabutin when patient was last seen by ID in the [name].  He was scheduled to follow-up with Dr. Gale Journey to start ART. Pt never went to that appointment and has not been receiving ART. He also has not been taking any prophylaxis secondary to medication side effects.  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  He was placed on triple therapy of azithromycin, rifabutin, ethambutol after his last [name] admission. He stopped all medications approximately 3 months ago due to side effects.  -Consult ID, appreciate recs  -Consider repeat sputum cultures due to current state and non-adherence to medication regimen  --Consider pulm consult   S/P cryptococcal meningitis On last admission in March 2022 patient was found to have cryptococcal meningitis via lumbar puncture.  Last LP on 04/10/20 showed no growth. He was discharged on fluconazole after IV Amphotericin, fluticasone in [name].  He notes that he has stopped taking all medications approximately 3 months ago.  It is unsure if he completed his fluconazole course outpatient.  Neuro exam is unremarkable today.  He does have some complaints of headaches and thinks his blurry vision may be worse than usual though he is unsure.  The headaches may be secondary to his COVID-19 infection as this is a common cause.  With his normal neurologic exam do not feel the need to start additional imaging overnight until we speak further with infectious disease on the recommendations. -Consult ID, appreciate recs -Consider additional imaging, low threshold if patient develops any neurologic symptoms -Can consider updated LP   Chronic Hyponatremia  Sodium level on admission was 126.  Patient seems dehydrated on exam.  He is chronically hyponatremic to low  130s. Pt is not currently symptomatic.  Possibly due to chronic alcohol use. -F/u CMP -Monitor   SVT S/P ablation Patient had SVT for which he underwent an ablation in March 2022 that was not successful.  He was recommended to continue beta-blocker outpatient. Pt has not been taking the beta blocker-HR 60s-80s. -Holding beta blocker for now   Moderate calorie protein malnutrition  Patient has a history of chronic calcific pancreatitis and chronic malnutrition  -nutrition consulted   HTN Blood pressures while in the [name] have been fairly normotensive. Not taking any prescribed medications.  Has been prescribed metoprolol in the past.  Does have positive cocaine on urine drug test today -Continue to monitor  Dyslipidemia  03/2020 Lipid panel: Total 141, Trig 118, HDL 31, LDL 86. Not taking any medications.  -Holding home medications   Alcohol Use Pt drinks 4-5 40's between him and his sister daily (2-2.5 40's himself nightly plus occasional liquor). Doesn't think hes been admitted for alcohol withdrawal. Last drink was 9/8 evening.  -CIWA every 4 hours -TOC consulted   H/o cocaine and tobacco use  Pt has a history of cocaine use and historically has smoked 1 pack every three days but cannot currently due to the patient's symptoms.  -UDS ordered  -Monitor symptoms   Medication Management  He is noncompliant with medications and would benefit from discussing options with social work.  FEN/GI: regular diet  Prophylaxis: Lovenox   Disposition: med  tele   History of Present Illness:  MADDOXX BURKITT is a 65 y.o. male presenting with fatigue, cough, chills and chest pain. Pt reports that his cough started about a month ago. He started feeling chest pain and fatigue today which is why he came in. He has been having chills and night sweats for years. He has not checked his temperature but feels as if he as a fever. Pt additionally endorses dizziness and lightheadedness. Over the last  couple of days, the patient has only been able to get up and go to the bathroom. Pt has otherwise been lying in bed.   He stopped taking all of his meds about 2-3 months ago because he thought they were making him sick and giving him a rash. He isn't taking anything since then. He has not been following up with infectious disease. He is from [place] but has been staying with his sister while in [place].   Alcohol: Pt drinks 4-5 40's between him and his sister daily (2-2.5 40's himself nightly plus occasional liquor). Doesn't think he's been admitted for alcohol withdrawal. Last drink was 9/8 in the evening. Tobacco: 1 pack every 3 days since age 47 but hasn't smoked much since cough started Drugs: some cocaine and marijuana  Review Of Systems: Per HPI with the following additions:   Review of Systems  Constitutional:  Positive for chills and fatigue. Negative for fever.  HENT:  Positive for rhinorrhea. Negative for sore throat.   Eyes:  Positive for visual disturbance (blurry, thinks moreso than usual).  Respiratory:  Positive for cough and shortness of breath.   Cardiovascular:  Positive for chest pain.  Gastrointestinal:  Positive for abdominal pain, nausea and vomiting (post tussive). Negative for constipation and diarrhea.  Genitourinary:  Negative for difficulty urinating and dysuria.  Musculoskeletal:  Positive for myalgias.  Neurological:  Positive for dizziness, light-headedness and headaches. Negative for syncope, speech difficulty and numbness.    Patient Active Problem List   Diagnosis Date Noted   COVID-19 10/12/2020   Mycobacterium avium infection ([facility])    Blurry vision, left eye    AKI (acute kidney injury) ([facility])    Cavitary lesion of lung    Acute nonintractable headache    Smoking    Cocaine use    SVT (supraventricular tachycardia) ([facility])    Abnormal EKG    Atrial flutter ([facility]) 03/27/2020   Aortic atherosclerosis ([facility]) 03/27/2020   Hypertension 03/27/2020    Cryptococcal meningitis ([facility]) 03/26/2020   Leukocytopenia 03/26/2020   Macrocytosis 03/26/2020   Moderate protein malnutrition ([facility]) 03/26/2020   Hypoalbuminemia 03/26/2020   AIDS (acquired immune deficiency syndrome) ([facility]) 03/26/2020    Past Medical History: Past Medical History:  Diagnosis Date   Human immunodeficiency virus (HIV) disease ([facility]) 03/26/2020    Past Surgical History: Past Surgical History:  Procedure Laterality Date   SVT ABLATION N/A 04/23/2020   Procedure: SVT ABLATION;  Surgeon: Evans Lance, MD;  Location: [facility];  Service: Cardiovascular;  Laterality: N/A;    Social History: Social History   Tobacco Use   Smoking status: Every Day    Types: Cigarettes   Smokeless tobacco: Former  Substance Use Topics   Alcohol use: Yes    Alcohol/week: 14.0 - 21.0 standard drinks    Types: 14 - 21 Cans of beer per week   Drug use: Not Currently    Family History: Family History  Problem Relation Age of Onset   Hypertension Mother  Kidney disease Mother    Hypertension Sister    Hypertension Brother    Allergies and Medications: Allergies  Allergen Reactions   Bactrim [Sulfamethoxazole-Trimethoprim] Other (See Comments)    Patient was trialed on DS TIW and SS daily for PJP prophylaxis and developed hyperkalemia and increased Scr   No current facility-administered medications on file prior to encounter.   Current Outpatient Medications on File Prior to Encounter  Medication Sig Dispense Refill   acetaminophen (TYLENOL) 500 MG tablet Take 500 mg by mouth every 6 (six) hours as needed for moderate pain or headache.     ibuprofen (ADVIL) 200 MG tablet Take 200 mg by mouth every 6 (six) hours as needed for headache or moderate pain.     acetaminophen (TYLENOL) 325 MG tablet Take 2 tablets (650 mg total) by mouth every 4 (four) hours as needed for fever or headache.     atovaquone (MEPRON) 750 MG/5ML suspension Take 10 mLs (1,500 mg total) by mouth  daily. 300 mL 11   azithromycin (ZITHROMAX) 500 MG tablet Take 1 tablet (500 mg total) by mouth daily. Take 1 tablet daily. 30 tablet 11   benzonatate (TESSALON) 100 MG capsule Take 1 capsule (100 mg total) by mouth every 8 (eight) hours. 21 capsule 0   ethambutol (MYAMBUTOL) 400 MG tablet Take 3 tablets (1,200 mg total) by mouth daily. 90 tablet 11   metoprolol tartrate (LOPRESSOR) 25 MG tablet Take 1 tablet (25 mg total) by mouth 2 (two) times daily. 60 tablet 0   metoprolol tartrate (LOPRESSOR) 25 MG tablet TAKE 1 TABLET (25 MG TOTAL) BY MOUTH TWO TIMES DAILY. 60 tablet 0   pravastatin (PRAVACHOL) 20 MG tablet Take 1 tablet (20 mg total) by mouth every evening. 30 tablet 0   pravastatin (PRAVACHOL) 20 MG tablet TAKE 1 TABLET (20 MG TOTAL) BY MOUTH EVERY EVENING. 30 tablet 0   rifabutin (MYCOBUTIN) 150 MG capsule Take 2 capsules (300 mg total) by mouth daily. 60 capsule 11   rifabutin (MYCOBUTIN) 150 MG capsule TAKE 2 CAPSULES (300 MG TOTAL) BY MOUTH DAILY. 30 capsule 11    Objective: BP 123/85   Pulse 74   Temp 100.1 F (37.8 C)   Resp 14   Ht _0  (1.854 m)   SpO2 93%   BMI 21.78 kg/m  Physical Exam Vitals reviewed.  Constitutional:      General: He is not in acute distress.    Appearance: He is not ill-appearing.  Eyes:     General: No visual field deficit. Cardiovascular:     Rate and Rhythm: Normal rate and regular rhythm.     Heart sounds: No murmur heard. Pulmonary:     Effort: Pulmonary effort is normal. No respiratory distress.     Comments: Bilateral crackles lower lobes  Abdominal:     General: Bowel sounds are normal.     Palpations: Abdomen is soft.     Tenderness: There is no abdominal tenderness.  Musculoskeletal:        General: Normal range of motion.     Right lower leg: No edema.     Left lower leg: No edema.  Skin:    General: Skin is warm and dry.     Comments: Multiple excoriations over bilateral upper extremities  Possible seborrheic dermatitis  present over face   Neurological:     Mental Status: He is alert and oriented to person, place, and time.     Cranial Nerves: No cranial nerve deficit  or facial asymmetry.     Sensory: Sensation is intact.     Motor: No weakness or tremor.     Comments: CN II through XII intact.  No focal neurological deficits. PERRL.  Fine touch sensation intact in upper and lower extremities bilaterally.  Strength 5/5 BUE and BLE. Normal heel to shin and finger to nose testing.   Psychiatric:        Mood and Affect: Mood normal.        Behavior: Behavior normal.     Labs and Imaging: CBC BMET  Recent Labs  Lab 10/12/20 1135  WBC 5.1  HGB 15.3  HCT 44.7  PLT 237   Recent Labs  Lab 10/12/20 1135  NA 126*  K 3.6  CL 91*  CO2 26  BUN 10  CREATININE 1.15  GLUCOSE 118*  CALCIUM 8.8*     EKG: My own interpretation (not copied from electronic read): NSR without any acute ST segment changes    Erskine Emery, MD 10/13/2020, 12:07 AM PGY-1, [facility] pager: [phone], text pages welcome  Upper Level Addendum:  I have seen and evaluated this patient along with Dr. Zigmund Daniel and reviewed the above note, making necessary revisions as appropriate.  I agree with the medical decision making and physical exam as noted above.  Lurline Del, DO PGY-3 [name]

## 2020-10-12 NOTE — ED Triage Notes (Signed)
Pt here from home for cp and shob x 1 month, pt reports its been consistently getting worse and not going away. Pt states "I might have pneumonia and I'm dehydrated". Reports productive cough w/ white mucus x73mo increased fatigue. C/o substernal pain in triage.

## 2020-10-12 NOTE — ED Notes (Signed)
Patient transported to CT 

## 2020-10-12 NOTE — Hospital Course (Addendum)
Hospital Course for Ricky [name] a 64 year who presented with cough, fever, and chest pain.  Past medical history significant for cryptococcal meningitis, HIV/AIDS, MAC, SVT s/p unsuccessful ablation, CKD, HTN, dyslipidemia, routine calorie malnutrition, polysubstance abuse.   Fever in the setting of Covid-19, HIV/AIDs with immunocompromised state Patient came in with an onset of fever and chest pain 1 month ago with increased fatigue.  In the ED troponins trended flat, tested positive for COVID-19, had a serum sodium of 126, and a CTA showed no evidence of pulmonary embolism.  T-max in the emergency department was 100.1 but no documented fever.  Last CD4 count was <30 not on ART and has been off medications for 2 to 4 years.  He had a recent hospitalization and March 2022 for cryptococcal meningitis and MAC. He did not complete his treatment regimens after discharge and did not follow-up.  He was previously discharged on single strength Bactrim for PCP due to his G6PD deficiency and DDI of atovaquone and rifamycin and did not continue these medications.  Patient has nodular opacities upper left lower lobe on chest x-ray and CTA chest.  Read by radiology as consistent with atypical pneumonia such as MAC.  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  Legionella testing was negative. He was empirically started on vancomycin and cefepime, which was discontinued on hospital day 2.  Patient was started on remdesivir for a 5-day course, and was not started on steroids given immunocompromise state. Patient remained stable on room air.  History of cryptococcal meningitis His admission in March 2022 was found to have cryptococcal meningitis, he had headaches at that time which improved with lumbar punctures.  He was started on fluconazole at discharge which he reports he did not complete.  He did not have any focal neurologic deficits,  but had a slight frontal headache.  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  Plan was for LP by neurology, but patient refused. He was started on fluconazole 800 mg daily by ID. Patient later got LP which was clear and cultures showed no growth. Decreased fluconazole to 400 mg per ID.  SVT s/p successful ablation Patient had SVT in March with an ablation that was unsuccessful.  He has not been continue his metoprolol outpatient and regularly uses with cocaine.  Had 2 bouts of SVT on telemetry. EKG is with normal sinus rhythm and troponins that trended flat. Had an episode of sustained Vtach for 6 minutes and cardiology was consulted for medication management and recommended digoxin at night for SVT control. Patient remained stable after this. K and Mag were monitored while on this. He was later transitioned to verapamil for control of this issue. He is not a candidate for repeat ablation.  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  He was discharged on 3 months of triple therapy with azithromycin, rifabutin, and ethambutol.  He did not complete these medications.  Chest x-ray and CTA of the chest showed left upper lobe infiltration, and was an unlikely presentation for MAC in patients who do not have COPD or atelectasis.  ID did not feel it necessary to treat for MAC given patient's poor compliance with medications and less severe than his other issues.   Chronic hyponatremia On admission was 126 and stayed around this.  Urine sodium and urine osmolality were  normal.  Cortisol was normal making it unlikely to be related to adrenal insufficiency. TSH was normal at 0.628 in February 2022, unlikely to have been related to thyroid. 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 He was given salt tablet during his stay.  Elevated LFTs Patient's AST and ALT were elevated with AST being greater than ALT in the  50s and Teens respectively.  Patient did not have right upper quadrant pain during hospital stay.  Polysubstance abuse CIWA scores ranged from 0-4 with elevations being closely x2 SVT runs.  Did not have any DTs.  Was put on folic acid injections and multivitamin containing thiamine.  Moderate protein calorie malnutrition Patient has chronic calcific pancreatitis and chronic malnutrition.  Registered dietitian consult was made and recommended Ensure Enlive p.o. 3 times daily with multivitamins and minerals daily.  Hypertension Patient has history of hypertension Has a history of cocaine use. Did not have any hypertensive emergencies urgencies. Verapamil helped with this when started for SVT.  Urinary Frequency Patient had urinary frequency 3 days before discharge. On fist day di have some dysuria which resolved on its own after this day. UA was collected which showed moderate leukocytes, negative nitrities. Urine culture showed 20,000 colonies of E Faecalis, likely colonization. 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 No additional abx prescribed for patient given dysuria resolved.  Follow Up: -Mediastinal lymph nodes increased in size compared to prior exam, recommend repeat CT in 3 months -Outpatient ID follow up and be connected with bridge counselor to be brought to appointment  -SA resources/treatment -outpatient ID clinic to 11/1 @ 9 am at the [facility] -Continue 400 mg fluconazole daily - Continue biktarvy daily -Referral made to [facility] to help with HIV social support

## 2020-10-13 ENCOUNTER — Inpatient Hospital Stay (HOSPITAL_COMMUNITY): Payer: Medicaid Other

## 2020-10-13 DIAGNOSIS — B451 Cerebral cryptococcosis: Secondary | ICD-10-CM

## 2020-10-13 DIAGNOSIS — R0602 Shortness of breath: Secondary | ICD-10-CM | POA: Diagnosis present

## 2020-10-13 DIAGNOSIS — I471 Supraventricular tachycardia: Secondary | ICD-10-CM | POA: Diagnosis not present

## 2020-10-13 DIAGNOSIS — U071 COVID-19: Secondary | ICD-10-CM | POA: Diagnosis present

## 2020-10-13 DIAGNOSIS — G8929 Other chronic pain: Secondary | ICD-10-CM | POA: Diagnosis present

## 2020-10-13 DIAGNOSIS — Z8611 Personal history of tuberculosis: Secondary | ICD-10-CM

## 2020-10-13 DIAGNOSIS — J439 Emphysema, unspecified: Secondary | ICD-10-CM | POA: Diagnosis present

## 2020-10-13 DIAGNOSIS — F1010 Alcohol abuse, uncomplicated: Secondary | ICD-10-CM | POA: Insufficient documentation

## 2020-10-13 DIAGNOSIS — M545 Low back pain, unspecified: Secondary | ICD-10-CM | POA: Diagnosis not present

## 2020-10-13 DIAGNOSIS — F1910 Other psychoactive substance abuse, uncomplicated: Secondary | ICD-10-CM

## 2020-10-13 DIAGNOSIS — I10 Essential (primary) hypertension: Secondary | ICD-10-CM | POA: Diagnosis present

## 2020-10-13 DIAGNOSIS — B159 Hepatitis A without hepatic coma: Secondary | ICD-10-CM | POA: Diagnosis present

## 2020-10-13 DIAGNOSIS — J431 Panlobular emphysema: Secondary | ICD-10-CM

## 2020-10-13 DIAGNOSIS — J189 Pneumonia, unspecified organism: Secondary | ICD-10-CM | POA: Diagnosis not present

## 2020-10-13 DIAGNOSIS — E860 Dehydration: Secondary | ICD-10-CM | POA: Diagnosis present

## 2020-10-13 DIAGNOSIS — F17210 Nicotine dependence, cigarettes, uncomplicated: Secondary | ICD-10-CM | POA: Diagnosis present

## 2020-10-13 DIAGNOSIS — E222 Syndrome of inappropriate secretion of antidiuretic hormone: Secondary | ICD-10-CM | POA: Diagnosis present

## 2020-10-13 DIAGNOSIS — B459 Cryptococcosis, unspecified: Secondary | ICD-10-CM | POA: Diagnosis not present

## 2020-10-13 DIAGNOSIS — D75A Glucose-6-phosphate dehydrogenase (G6PD) deficiency without anemia: Secondary | ICD-10-CM | POA: Diagnosis present

## 2020-10-13 DIAGNOSIS — E785 Hyperlipidemia, unspecified: Secondary | ICD-10-CM | POA: Diagnosis present

## 2020-10-13 DIAGNOSIS — R519 Headache, unspecified: Secondary | ICD-10-CM

## 2020-10-13 DIAGNOSIS — J1282 Pneumonia due to coronavirus disease 2019: Secondary | ICD-10-CM | POA: Diagnosis not present

## 2020-10-13 DIAGNOSIS — K861 Other chronic pancreatitis: Secondary | ICD-10-CM | POA: Diagnosis present

## 2020-10-13 DIAGNOSIS — D7589 Other specified diseases of blood and blood-forming organs: Secondary | ICD-10-CM | POA: Diagnosis present

## 2020-10-13 DIAGNOSIS — F1410 Cocaine abuse, uncomplicated: Secondary | ICD-10-CM | POA: Diagnosis present

## 2020-10-13 DIAGNOSIS — E440 Moderate protein-calorie malnutrition: Secondary | ICD-10-CM | POA: Diagnosis present

## 2020-10-13 DIAGNOSIS — I4892 Unspecified atrial flutter: Secondary | ICD-10-CM | POA: Diagnosis present

## 2020-10-13 DIAGNOSIS — Z9119 Patient's noncompliance with other medical treatment and regimen: Secondary | ICD-10-CM | POA: Diagnosis not present

## 2020-10-13 DIAGNOSIS — A310 Pulmonary mycobacterial infection: Secondary | ICD-10-CM | POA: Diagnosis present

## 2020-10-13 DIAGNOSIS — I700 Atherosclerosis of aorta: Secondary | ICD-10-CM | POA: Diagnosis present

## 2020-10-13 DIAGNOSIS — F1020 Alcohol dependence, uncomplicated: Secondary | ICD-10-CM | POA: Diagnosis present

## 2020-10-13 DIAGNOSIS — H538 Other visual disturbances: Secondary | ICD-10-CM

## 2020-10-13 DIAGNOSIS — F1210 Cannabis abuse, uncomplicated: Secondary | ICD-10-CM | POA: Diagnosis present

## 2020-10-13 DIAGNOSIS — B20 Human immunodeficiency virus [HIV] disease: Secondary | ICD-10-CM | POA: Diagnosis present

## 2020-10-13 DIAGNOSIS — E875 Hyperkalemia: Secondary | ICD-10-CM | POA: Diagnosis not present

## 2020-10-13 LAB — URINALYSIS, ROUTINE W REFLEX MICROSCOPIC
Bilirubin Urine: NEGATIVE
Glucose, UA: NEGATIVE mg/dL
Ketones, ur: NEGATIVE mg/dL
Nitrite: NEGATIVE
Protein, ur: NEGATIVE mg/dL
Specific Gravity, Urine: 1.01 (ref 1.005–1.030)
pH: 6 (ref 5.0–8.0)

## 2020-10-13 LAB — URINALYSIS, MICROSCOPIC (REFLEX): Bacteria, UA: NONE SEEN

## 2020-10-13 LAB — CBC WITH DIFFERENTIAL/PLATELET
Abs Immature Granulocytes: 0 10*3/uL (ref 0.00–0.07)
Basophils Absolute: 0 10*3/uL (ref 0.0–0.1)
Basophils Relative: 0 %
Eosinophils Absolute: 0 10*3/uL (ref 0.0–0.5)
Eosinophils Relative: 0 %
HCT: 40.4 % (ref 39.0–52.0)
Hemoglobin: 13.5 g/dL (ref 13.0–17.0)
Lymphocytes Relative: 7 %
Lymphs Abs: 0.3 10*3/uL — ABNORMAL LOW (ref 0.7–4.0)
MCH: 34.7 pg — ABNORMAL HIGH (ref 26.0–34.0)
MCHC: 33.4 g/dL (ref 30.0–36.0)
MCV: 103.9 fL — ABNORMAL HIGH (ref 80.0–100.0)
Monocytes Absolute: 0.6 10*3/uL (ref 0.1–1.0)
Monocytes Relative: 13 %
Neutro Abs: 3.8 10*3/uL (ref 1.7–7.7)
Neutrophils Relative %: 80 %
Platelets: 314 10*3/uL (ref 150–400)
RBC: 3.89 MIL/uL — ABNORMAL LOW (ref 4.22–5.81)
RDW: 12.2 % (ref 11.5–15.5)
WBC: 4.8 10*3/uL (ref 4.0–10.5)
nRBC: 0 % (ref 0.0–0.2)
nRBC: 0 /100 WBC

## 2020-10-13 LAB — SODIUM, URINE, RANDOM: Sodium, Ur: 63 mmol/L

## 2020-10-13 LAB — COMPREHENSIVE METABOLIC PANEL
ALT: 15 U/L (ref 0–44)
AST: 47 U/L — ABNORMAL HIGH (ref 15–41)
Albumin: 2.2 g/dL — ABNORMAL LOW (ref 3.5–5.0)
Alkaline Phosphatase: 106 U/L (ref 38–126)
Anion gap: 7 (ref 5–15)
BUN: 11 mg/dL (ref 8–23)
CO2: 22 mmol/L (ref 22–32)
Calcium: 8.1 mg/dL — ABNORMAL LOW (ref 8.9–10.3)
Chloride: 99 mmol/L (ref 98–111)
Creatinine, Ser: 1 mg/dL (ref 0.61–1.24)
GFR, Estimated: >60 mL/min (ref >60)
Glucose, Bld: 149 mg/dL — ABNORMAL HIGH (ref 70–99)
Potassium: 3.8 mmol/L (ref 3.5–5.1)
Sodium: 128 mmol/L — ABNORMAL LOW (ref 135–145)
Total Bilirubin: 1.4 mg/dL — ABNORMAL HIGH (ref 0.3–1.2)
Total Protein: 7.3 g/dL (ref 6.5–8.1)

## 2020-10-13 LAB — RAPID URINE DRUG SCREEN, HOSP PERFORMED
Amphetamines: NOT DETECTED
Barbiturates: NOT DETECTED
Benzodiazepines: NOT DETECTED
Cocaine: POSITIVE — AB
Opiates: NOT DETECTED
Tetrahydrocannabinol: POSITIVE — AB

## 2020-10-13 LAB — CRYPTOCOCCAL ANTIGEN
Crypto Ag: POSITIVE — AB
Cryptococcal Ag Titer: 320 — AB

## 2020-10-13 LAB — OSMOLALITY, URINE: Osmolality, Ur: 496 mOsm/kg (ref 300–900)

## 2020-10-13 IMAGING — MR MR HEAD WO/W CM
6 of 12 series · 26 of 48 positions shown · IV contrast (Yes GAD)
Comparison: MRI [DATE].

CLINICAL DATA: Meningitis/CNS infection suspected

EXAM:
MRI HEAD WITHOUT AND WITH CONTRAST
TECHNIQUE: Multiplanar, multiecho pulse sequences of the brain and surrounding
structures were obtained without and with intravenous contrast.
CONTRAST:  7mL GADAVIST GADOBUTROL 1 MMOL/ML IV SOLN

[Series 2: DWI · axial · 3.0mm · 0.94mm/px · z∈[-48,+99]mm · 8 of 99 slices shown (1 of 2)]
[im 1/99]
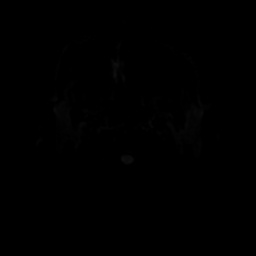
[im 15/99]
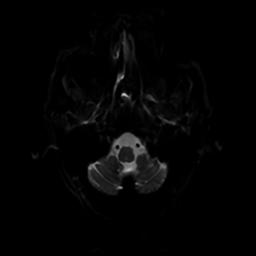
[im 29/99]
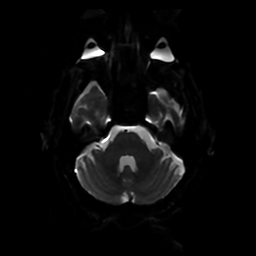
[im 43/99]
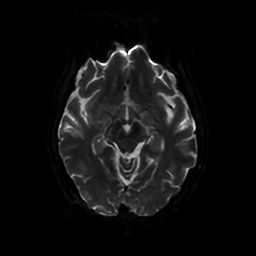
[im 57/99]
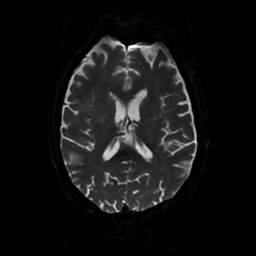
[im 71/99]
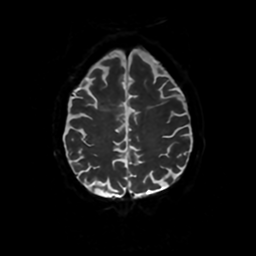
[im 85/99]
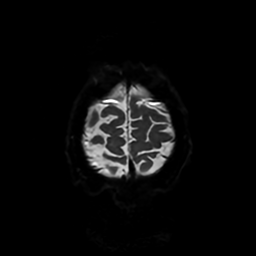
[im 99/99]
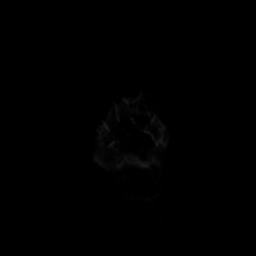

[Series 3: DWI · coronal · 4.0mm · 0.94mm/px · 6 of 69 slices shown (2 of 2)]
[im 1/69]
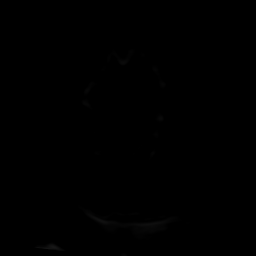
[im 14/69]
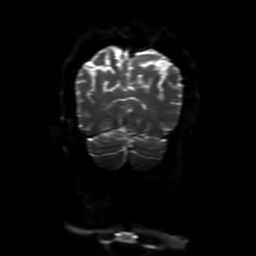
[im 28/69]
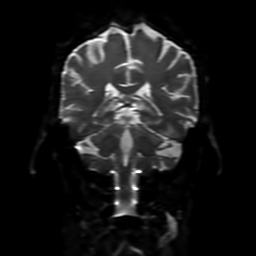
[im 41/69]
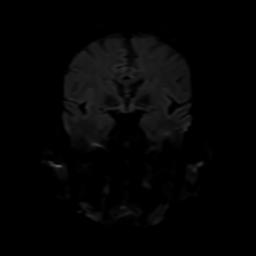
[im 55/69]
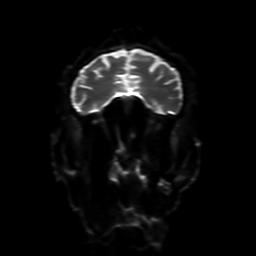
[im 69/69]
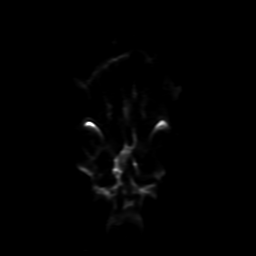

[Series 4: FLAIR · sagittal · 5.0mm · 0.23mm/px · 2 of 23 slices shown (1 of 2)]
[im 1/23]
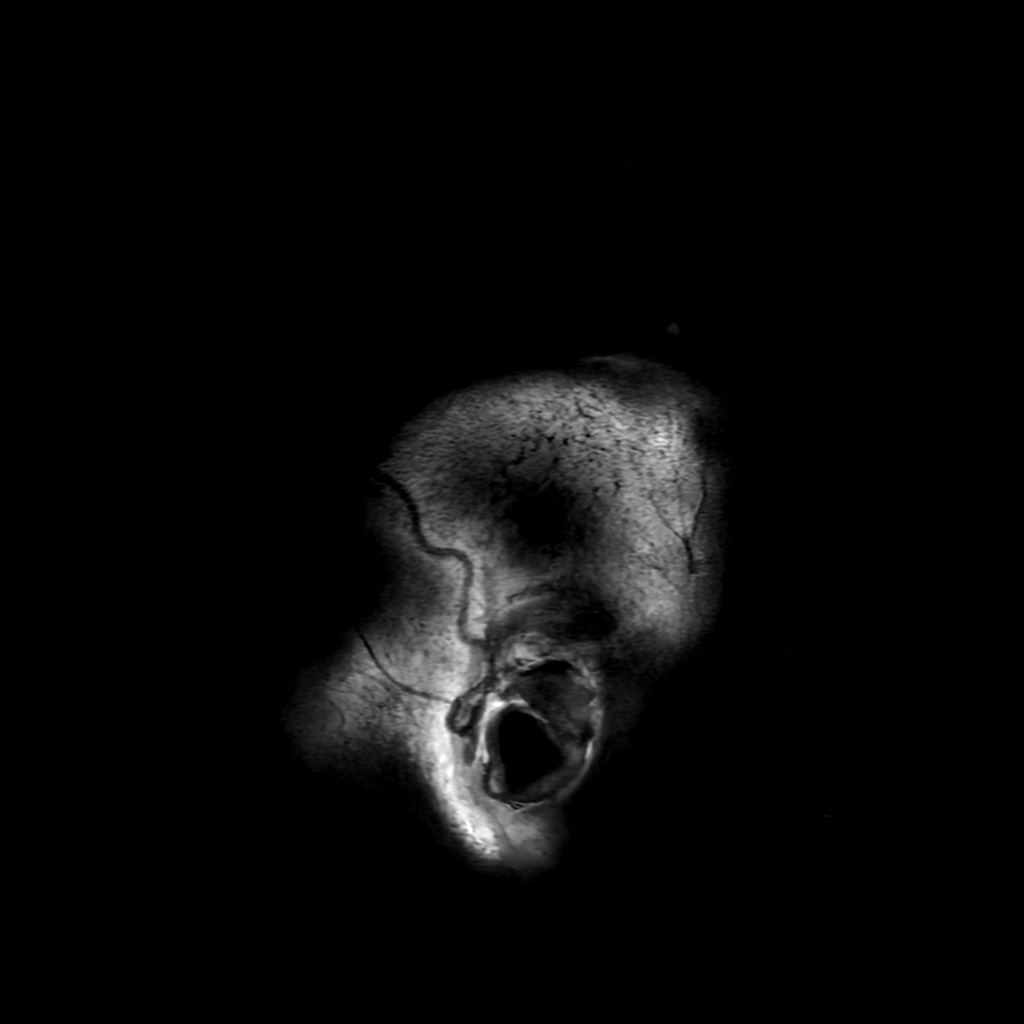
[im 23/23]
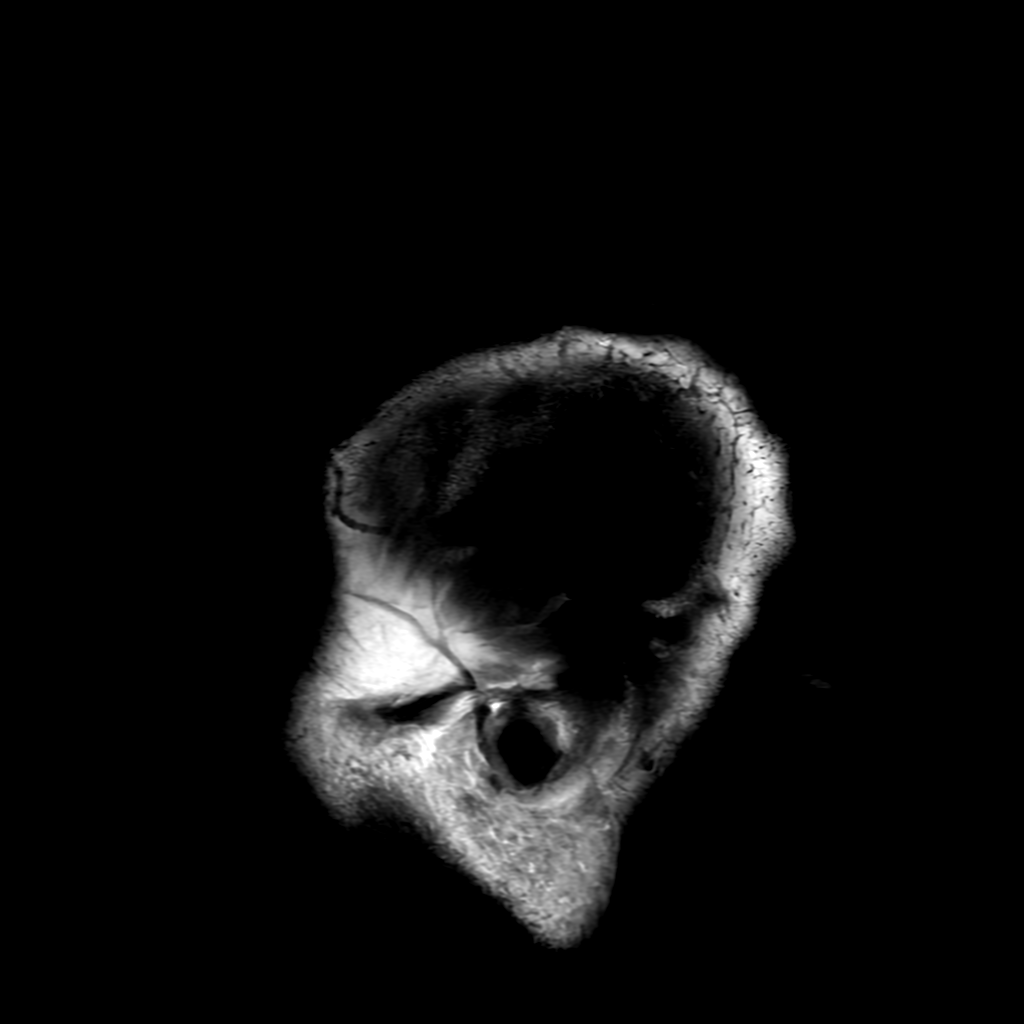

[Series 6: FLAIR · axial · 4.0mm · 0.45mm/px · z∈[-38,+93]mm · 3 of 31 slices shown (2 of 2)]
[im 1/31]
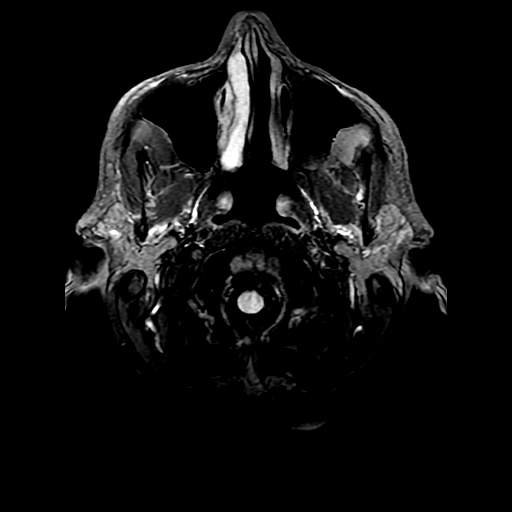
[im 16/31]
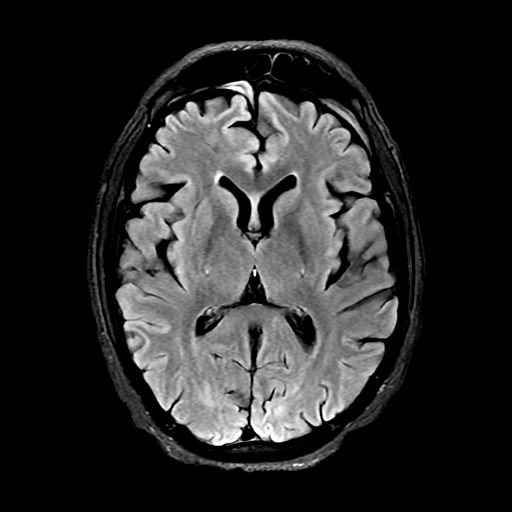
[im 31/31]
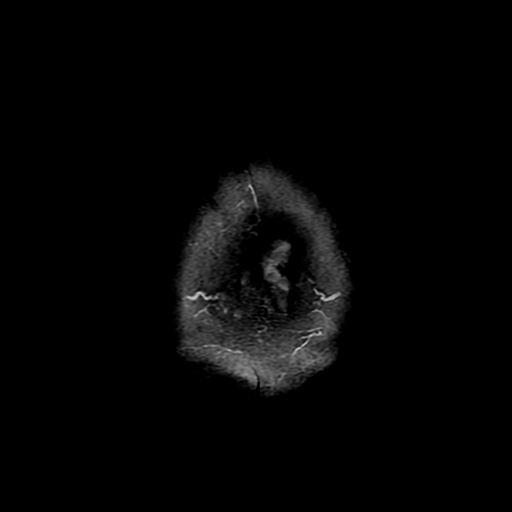

[Series 250: ADC · axial · 3.0mm · 0.94mm/px · z∈[-48,+99]mm · 4 of 49 slices shown (1 of 2)]
[im 1/49]
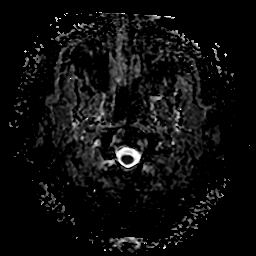
[im 17/49]
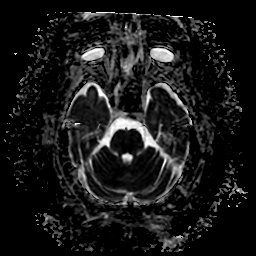
[im 33/49]
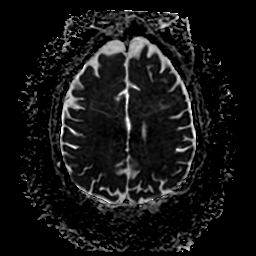
[im 49/49]
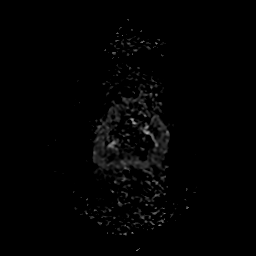

[Series 350: ADC · coronal · 4.0mm · 0.94mm/px · 3 of 35 slices shown (2 of 2)]
[im 1/35]
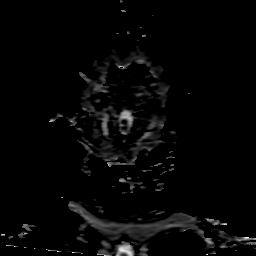
[im 18/35]
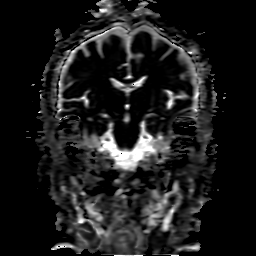
[im 35/35]
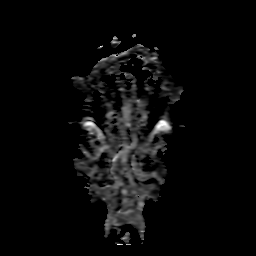

[26 of 48 positions shown; findings below may reference images not displayed]

FINDINGS: Mildly motion limited exam.  Within this limitation:

Brain: No change in trace extra-axial fluid anteriorly along the
anterior frontal convexities, likely chronic hematomas (for example
series 6, image 16). No acute infarct. No mass lesion, midline
shift, or hydrocephalus. No abnormal enhancement.

Vascular: Major arterial flow voids are maintained at the skull
base.

Skull and upper cervical spine: Normal marrow signal.

Sinuses/Orbits: Moderate ethmoid air cell mucosal thickening. Mild
left frontal sinus mucosal thickening. Air-fluid level in the left
maxillary sinus.

Other: No mastoid effusions.
IMPRESSION: 1. No evidence of acute intracranial abnormality on this motion
limited study.
2. Similar trace bilateral anterior frontal convexity subdural
hematomas.
3. Paranasal sinus disease with air-fluid level in the left
maxillary sinus.

## 2020-10-13 MED ORDER — FLUTICASONE PROPIONATE 50 MCG/ACT NA SUSP
1.0000 | Freq: Every day | NASAL | Status: DC
  Administered 2020-10-13 – 2020-10-22 (×6): 1 via NASAL
  Filled 2020-10-13: qty 16

## 2020-10-13 MED ORDER — ACETAMINOPHEN 500 MG PO TABS
1000.0000 mg | ORAL_TABLET | Freq: Four times a day (QID) | ORAL | Status: DC | PRN
  Administered 2020-10-13 – 2020-10-26 (×10): 1000 mg via ORAL
  Filled 2020-10-13 (×13): qty 2

## 2020-10-13 MED ORDER — ENOXAPARIN SODIUM 40 MG/0.4ML IJ SOSY: 40.0000 mg | PREFILLED_SYRINGE | INTRAMUSCULAR | Status: DC

## 2020-10-13 MED ORDER — GADOBUTROL 1 MMOL/ML IV SOLN
7.0000 mL | Freq: Once | INTRAVENOUS | Status: AC | PRN
  Administered 2020-10-13: 7 mL via INTRAVENOUS

## 2020-10-13 MED ORDER — DM-GUAIFENESIN ER 30-600 MG PO TB12
1.0000 | ORAL_TABLET | Freq: Two times a day (BID) | ORAL | Status: DC
  Administered 2020-10-13 – 2020-10-26 (×27): 1 via ORAL
  Filled 2020-10-13 (×15): qty 1
  Filled 2020-10-13: qty 2
  Filled 2020-10-13 (×13): qty 1

## 2020-10-13 NOTE — ED Notes (Signed)
Report called to [facility]

## 2020-10-13 NOTE — ED Notes (Signed)
MRI called and asked to send Pt to 2W after MRI

## 2020-10-13 NOTE — ED Notes (Signed)
Tried to call report to floor. "RN at lunch and already has 5 patients." Bed placement to be called

## 2020-10-13 NOTE — Progress Notes (Signed)
Discussed with Dr. Tommy Medal who recommended LP due to h/o cryptococcal meningitis without appropriate therapy, now with headache. Patient does not have any focal neurodeficits at this time. Discussed with Dr. Cheral Marker, neurology, who recommended MRI brain w and w/o contrast to evaulate brain and ensure not at risk for herniation with LP. I performed consent with patient at bedside for LP. MRI ordered, patient is next in line.  Gladys Damme, MD [facility], PGY-3

## 2020-10-13 NOTE — ED Notes (Signed)
Lunch tray ordered 

## 2020-10-13 NOTE — Progress Notes (Addendum)
Family Medicine Teaching Service Daily Progress Note Intern Pager: [phone]  Patient name: Ricky [name] Medical record number: [id number] Date of birth: [birth year] Age: 66 y.o. Gender: male  Primary Care Provider: Pcp, No Consultants: ID Code Status: Full  Pt Overview and Major Events to Date:  9/9: admitted for fever, COVID-19  Assessment and Plan: Ricky [name] is a 65 year old man presenting with cough, fever, and chest pain.  Past medical history is significant for history of cryptococcal meningitis, HIV/AIDS, MAC, SVT s/p unsuccessful ablation, CKD, HTN, dyslipidemia, routine calorie malnutrition, polysubstance use.  Fever  COVID-19  HIV/AIDS and immunocompromised state 65 year old man presents with a 100.1 F in the emergency department last night.  Today he continues to have an elevated temperature, there is no documented fever but patient is warm to the touch on exam.  Patient has several possible causes for fever given immunocompromise state with HIV/AIDS (last CD4 count less than 30, the ill greater than 35,000, not on ART), and recent hospitalization in March 2022 for cryptococcal meningitis and MAC. Patient did not complete tx regimens after discharge and did not follow up. He was previously discharged on single strength bactrim for PCP ppx due to his G6PD deficiency and DDI of atovaqone and rifamycins; however, he also did not continue this medication. Patient has new nodular opacities in upper left lower lobe on CXR and CTA chest, read by radiology as c/w atypical pna such as MAC. This is an unusual finding in both COVID and MAC (without history of COPD or atelectasis, which patient does not have). 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 Legionella testing is pending, but unlikely that patient would also have a bacterial pneumonia on COVID infection with MAC as well, but it is possible. Spoke with  Dr. Tommy Medal, ID, who will see patient later. He does not recommend abx coverage for bacterial pneumonia at this time.  Patient is stable on room air, however given his very high risk for progression to severe disease, will continue remdesivir.  Discussed with both pharmacy and with Dr. Drucilla Schmidt, will hold off on steroids given immunocompromise state.  Patient previously had COVID in January 2022.  Patient reported that he had 1 dose of [employer] vaccine in 2021. He was previously on ART in [place], but has not followed up for ART restart and has been of medications for 2-4 years. 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  He was discharged this year with 3 months of triple therapy with azithromycin, rifabutin, and ethambutol.  However patient reports that he did not complete these medications as prescribed.  Chest x-ray and CTA chest showed left upper lobe infiltration, however this is an unlikely presentation for MAC in patients who do not have COPD, atelectasis. -ID consulted, appreciate recommendations  H/o cryptococcal meningitis Admitted in March 2022 and found to have cryptococcal meningitis.  He had headaches at that time which improved with lumbar punctures.  He was treated with fluconazole on discharge, which she reports he did not complete.  At  this time his neurology exam is negative for any focal deficits, he reports a slight frontal headache, however this could also be due to the URI symptoms with COVID-19.  We will keep very close eye on this and low threshold to  consult neurology for LP. -Close monitoring of headache symptoms -Infectious disease consulted, appreciate recommendations  Chronic hyponatremia Patient had sodium level on admission of 126, stable at 129 this morning.  He has a history of hyponatremia in the past also present during his last admission.  DDx includes beer potomania (drinks approximately 100 mL of beer daily, possibly SIADH. Will obtain urine sodium and urine osm. -AM CMP - Urine sodium, urine osmoles  Elevated LFT AST/ALT 47/15 today.  Patient has known excessive use of EtOH and thus this drinks approximately 2.5 40s per day as well as liquor on top of that).  In January this year patient had normal LFTs as well as normal bilirubin.  Today he has very mildly elevated bilirubin to 1.4.  Previous hepatitis work-up revealed positive hepatitis A antibody, negative hepatitis C antibody, as well as likely previous infection with hepatitis B given positive hepatitis B core antibody, but negative hepatitis B surface antigen. Given that total bilirubin is only mildly elevated, will recheck CMP tomorrow.  Possibly due to G6PD deficiency.  Can consider RUQ [place]. - AM CMP  Macrocytosis without anemia Has normal hemoglobin 13.5, however has notable macrocytosis to 103.9 today which was not present at last admission in March.  This could be due to EtOH use and/or G6PD def. Will obtain folate and B12. Consider peripheral smear. -Daily CBC - Folate, vitamin B12 labs  SVT s/p unsuccessful ablation Patient had SVT in March and underwent an ablation at that time which was not successful.  He was recommended to continue on metoprolol outpatient.  However he did not do so.  Patient also regularly uses cocaine.  Since admission in between 29s and 80s, only 1 episode of tachycardia 121.  Will monitor with cardiac monitoring.  EKG showed normal sinus rhythm with no acute ST changes. -Cardiac monitoring  Polysubstance use Patient reports that he drinks  about 2.5 40s per night as well as drinking liquor on top of that on occasion.  He does not see his EtOH use is a problem at this time.  He does identify his drug use is a problem.  He reports that he both smokes and snorts cocaine, which was found in his UDS as well as marijuana.  Previously in March had an UDS that was positive for amphetamines.  He identifies his cocaine use is a major problem in his life and is motivated to stop using.  He previously had a bout of sobriety from narcotics from 2008-2019.  He reports no history of DTs, cannot recall any hospitalizations for alcohol withdrawal. -TOC consult for substance abuse resources -[facility]  Moderate protein calorie malnutrition Patient has a history of chronic calcific pancreatitis and chronic malnutrition. -Consult to registered dietitian  Hypertension Patient has history of hypertension, currently not on any medications.  Since being in the hospital his blood pressure has been normotensive.  Most recent blood pressure 130/97.  We will continue to monitor and add medications as necessary. -Continue monitoring  Medication management Has a number of chronic and serious medical conditions and has not been connected with care due to failure to follow-up and polysubstance use.  We will consult transitions of care to help patient set up PCP and get connected with HIV treatment  as well as substance use resources. -TOC consult  FEN/GI: Regular diet PPx: Lovenox Dispo:Home pending clinical improvement . Barriers include fever, immunocompromise state, requires further medical work-up.   Subjective:  Patient reports that he has had coughing with sputum production overnight, still having "sinus headache."  He reports feeling warm, as if he has a fever, and is warm to the touch.  Denies chills.  He comments that he recognizes that by not taking care of his HIV/AIDS, he is at risk for becoming very ill.  He wants to start taking ART, he recognizes that  his biggest barrier to regular medication use is his drug use, specifically cocaine.  He does not see his alcohol use as a problem.  He notes that he both smokes and snorts cocaine.  He reports that he had a period of sobriety from 2008-20 19, and he relapsed on Father's Day of 2019.  He reports that Father's Day is a painful time for him.  However he recognizes that his substance use is getting in the way of his health and he would reports that he would like resources for substance use, states that he would like to stop using recreational drugs would like to be connected with HIV care.  Objective: Temp:  [100.1 F (37.8 C)] 100.1 F (37.8 C) (09/09 1131) Pulse Rate:  [64-121] 74 (09/10 0600) Resp:  [14-25] 20 (09/10 0600) BP: (112-153)/(75-102) 126/87 (09/10 0600) SpO2:  [92 %-100 %] 98 % (09/10 0600) Physical Exam: General: Age-appropriate African-American man, resting comfortably in bed, no acute distress, thin appearing, alert and at baseline Cardiovascular: RRR, no M/R/G, 2+ radial pulses Respiratory: Crackles appreciated in all right lung fields, no wheezing, good air movement in all lung fields, no increased work of breathing Abdomen: Soft, nontender, nondistended, normal bowel sounds Extremities: Warm, no edema, 2+ DP Neuro: Cranial Nerves II: PERRL.  III,IV, VI: EOMI without ptosis or diplopia.  V: Facial sensation is symmetric to touch VII: Facial movement is symmetric.  VIII: Hearing is intact to voice X: Palate elevates symmetrically XI: Shoulder shrug is symmetric. XII: Tongue is midline without atrophy or fasciculations.  Motor: Tone is normal. Bulk is normal. 5/5 strength was present in all four extremities.  Sensory: Sensation is symmetric to light touch in the arms and legs. Deep Tendon Reflexes: 2+ and symmetric in the biceps and patellae.   Laboratory: Recent Labs  Lab 10/12/20 1135 10/13/20 0602  WBC 5.1 4.8  HGB 15.3 13.5  HCT 44.7 40.4  PLT 237 314    Recent Labs  Lab 10/12/20 1135 10/13/20 0602  NA 126* 128*  K 3.6 3.8  CL 91* 99  CO2 26 22  BUN 10 11  CREATININE 1.15 1.00  CALCIUM 8.8* 8.1*  PROT  --  7.3  BILITOT  --  1.4*  ALKPHOS  --  106  ALT  --  15  AST  --  47*  GLUCOSE 118* 149*   Imaging/Diagnostic Tests: DG Chest 2 View  Result Date: 10/12/2020 CLINICAL DATA:  Shortness of breath. EXAM: CHEST - 2 VIEW COMPARISON:  03/27/2020 and 04/19/2020. FINDINGS: Stable cardiomediastinal contours. No pleural effusion or edema. Previously described cystic and nodular opacities within the left upper lobe are again noted corresponding to chronic atypical infectious process. Not significantly changed when compared with portable radiograph dated 04/19/2020. No acute superimposed airspace opacities identified. The visualized osseous structures appear intact. IMPRESSION: 1. Persistent cystic and nodular opacities within the left upper lobe. As described on previous  CT, these findings are favored to represent sequelae of atypical infection. 2. No acute superimposed airspace consolidation. Electronically Signed   By: Kerby Moors M.D.   On: 10/12/2020 12:24   CT Angio Chest PE W and/or Wo Contrast  Result Date: 10/12/2020 CLINICAL DATA:  Shortness of breath EXAM: CT ANGIOGRAPHY CHEST WITH CONTRAST TECHNIQUE: Multidetector CT imaging of the chest was performed using the standard protocol during bolus administration of intravenous contrast. Multiplanar CT image reconstructions and MIPs were obtained to evaluate the vascular anatomy. CONTRAST:  41m OMNIPAQUE IOHEXOL 350 MG/ML SOLN COMPARISON:  Chest CT dated March 27, 2020 FINDINGS: Cardiovascular: Normal heart size. No pericardial effusion. Adequate contrast opacification of the pulmonary arteries. No evidence of pulmonary embolus. Atherosclerotic disease of the thoracic aorta. Mediastinum/Nodes: Increased mediastinal adenopathy. Preference subcarinal lymph node measures 2.5 cm in short axis,  previously 1.3. Reference AP window lymph node measures 1.6 cm in short axis, previously 0.7. Unchanged bilateral hilar lymph nodes. Reference left hilar lymph node measuring 1.2 cm in short axis, previously 1.2 cm. Lungs/Pleura: Central airways are patent. Centrilobular and paraseptal emphysema. Nodular and cavitary areas of airspace consolidation in the left upper and lower lobes, overall decreased compared to prior exam. Although there are new nodular opacities in the left lower lobe. Reference nodule measuring up to 1.3 cm on series 7, image 335, previously 0.6 cm. Unchanged bronchiectasis of the left upper lobe, likely due to prior infection. Upper Abdomen: Numerous calcifications of the pancreas, likely sequela of chronic pancreatitis. Layering sludge versus stones seen in the gallbladder. No acute findings. Musculoskeletal: No chest wall abnormality. No acute or significant osseous findings. Review of the MIP images confirms the above findings. IMPRESSION: No evidence of pulmonary embolus. Nodular opacities of the left lung are overall improved when compared with prior CT, although there are some new areas of nodularity in the left lower lobe. 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 Mediastinal lymph nodes are increased in size compared to prior exam, these may be reactive given presence of known infection. Recommend follow-up chest CT in 3 months. Aortic Atherosclerosis (ICD10-I70.0) and Emphysema (ICD10-J43.9). Electronically Signed   By: LYetta GlassmanM.D.   On: 10/12/2020 16:28    MGladys Damme MD 10/13/2020, 9:49 AM PGY-3, [facility] pager: [phone] text pages welcome

## 2020-10-13 NOTE — Progress Notes (Signed)
FPTS Brief Progress Note  S: Saw patient at bedside for evening rounds. Patient was sleeping comfortably. I did not wake the patient.   O: BP (!) 126/91 (BP Location: Left Arm)   Pulse 60   Temp 98.1 F (36.7 C) (Oral)   Resp 14   Ht '6\' 1"'$  (1.854 m)   Wt 77.1 kg   SpO2 98%   BMI 22.43 kg/m   Gen: sleeping comfortably, NAD Resp: normal work of breathing   A/P: -Plan for LP in the morning per neuro -ID & neuro following, appreciate recommendations -Orders reviewed. Labs for AM ordered, which was adjusted as needed.  -Remainder of plan per day team   Alcus Dad, MD 10/13/2020, 11:48 PM PGY-2, [facility] Night Resident  Please page [phone] with questions.

## 2020-10-13 NOTE — ED Notes (Signed)
Lunch tray at bedside. Pt asleep. Staff woke pt up to make aware of lunch tray. Pt verbalized understanding

## 2020-10-13 NOTE — Consult Note (Signed)
Neurology Consultation  Reason for Consult: Headache and blurry vision with recent history of cryptococcal meningitis in March of 2022  Referring Physician: Dr. Ardelia Mems  CC: Intermittent frontal headache  History is obtained from: Patient, Chart review  HPI: Ricky [name] is a 65 y.o. 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 He states that his headache has been present in the early mornings and late nights across his forehead with an 8/10 severity but that they improve with acetaminophen. He states that he gets "hay fever and sinusitis" this time of year and that his headaches are likely due to these annual illnesses or "more of the same from last time". NP attempted further interview with the patient but was unable to obtain further information due to patient increasing agitation with examiner.   Of note, Mr. Shober presented to the ED earlier this year on 03/27/2020 with complaints of a constant, progressive headache with dizziness and blurry vision for 3 weeks. An LP was done at that time with CSF cryptococcal antigen positive and ID started him on amphotericin B and flucytosine. Hospitalization at that time was complicated by SIADH, SVT, and left lung cavitary lesion with sputum AFB pro positive for MAC. During his prolonged admission, he required repeat lumbar punctures with elevated intracranial pressures with resulting improvement in his recurrent headaches  between lumbar punctures. During his last admission, ophthalmology noted that Mr. Gravlin has chronic blurred vision OD. Patient states that on discharge 04/25/2020 he was compliant with his medications for approximately 2 months before he stopped taking them again.   ROS: Unable to obtain by NP due to patient increasing agitation and lack of cooperation with examiner. Attending attempted further ROS about 1 hour afterwards and the patient was able to state that his current headache was rated as 8/10 and was nonthrobbing, involving his forehead region. Also stated that he has had chills recently, but no fevers. Has had some chest pain. Started having diarrhea yesterday.   Past Medical History:  Diagnosis Date   Human immunodeficiency virus (HIV) disease ([place]) 03/26/2020   Past Surgical History:  Procedure Laterality Date   SVT ABLATION N/A 04/23/2020   Procedure: SVT ABLATION;  Surgeon: Evans Lance, MD;  Location: [facility];  Service: Cardiovascular;  Laterality: N/A;   Family History  Problem Relation Age of Onset   Hypertension Mother    Kidney disease Mother    Hypertension Sister    Hypertension Brother    Social History:   reports that he has been smoking cigarettes. He has quit using smokeless tobacco. He reports current alcohol use of about 14.0 - 21.0 standard drinks per week. He reports that he does not currently use drugs.  Medications  Current Facility-Administered Medications:    acetaminophen (TYLENOL) tablet 1,000 mg, 1,000 mg, Oral, Q6H PRN, Gladys Damme, MD, 1,000 mg at 10/13/20 1337   dextromethorphan-guaiFENesin (MUCINEX DM) 30-600 MG per 12 hr tablet 1 tablet, 1 tablet, Oral, BID, Gladys Damme, MD, 1 tablet at  10/13/20 1222   [START ON 10/14/2020] enoxaparin (LOVENOX) injection 40 mg, 40 mg, Subcutaneous, Q24H, Gladys Damme, MD   fluticasone [facility]) 50 MCG/ACT nasal spray 1 spray, 1 spray, Each Nare, Daily, Gladys Damme, MD, 1 spray at  00/92/33 0076   folic acid injection 1 mg, 1 mg, Intravenous, Daily, Welborn, Ryan, DO, 1 mg at 10/13/20 1008   [COMPLETED] remdesivir 200 mg in sodium chloride 0.9% 250 mL IVPB, 200 mg, Intravenous, Once, Stopped at 10/13/20 0110 **FOLLOWED BY** remdesivir 100 mg in sodium chloride 0.9 % 100 mL IVPB, 100 mg, Intravenous, Daily, Lurline Del, DO, Stopped at 10/13/20 1112  Current Outpatient Medications:    acetaminophen (TYLENOL) 500 MG tablet, Take 500 mg by mouth every 6 (six) hours as needed for moderate pain or headache., Disp: , Rfl:    ibuprofen (ADVIL) 200 MG tablet, Take 200 mg by mouth every 6 (six) hours as needed for headache or moderate pain., Disp: , Rfl:    acetaminophen (TYLENOL) 325 MG tablet, Take 2 tablets (650 mg total) by mouth every 4 (four) hours as needed for fever or headache. (Patient not taking: Reported on 10/13/2020), Disp: , Rfl:    atovaquone (MEPRON) 750 MG/5ML suspension, Take 10 mLs (1,500 mg total) by mouth daily. (Patient not taking: Reported on 10/13/2020), Disp: 300 mL, Rfl: 11   azithromycin (ZITHROMAX) 500 MG tablet, Take 1 tablet (500 mg total) by mouth daily. Take 1 tablet daily. (Patient not taking: Reported on 10/13/2020), Disp: 30 tablet, Rfl: 11   benzonatate (TESSALON) 100 MG capsule, Take 1 capsule (100 mg total) by mouth every 8 (eight) hours. (Patient not taking: Reported on 10/13/2020), Disp: 21 capsule, Rfl: 0   ethambutol (MYAMBUTOL) 400 MG tablet, Take 3 tablets (1,200 mg total) by mouth daily. (Patient not taking: Reported on 10/13/2020), Disp: 90 tablet, Rfl: 11   metoprolol tartrate (LOPRESSOR) 25 MG tablet, Take 1 tablet (25 mg total) by mouth 2 (two) times daily., Disp: 60 tablet, Rfl: 0   metoprolol tartrate (LOPRESSOR) 25 MG tablet, TAKE 1 TABLET (25 MG TOTAL) BY MOUTH TWO TIMES DAILY. (Patient not taking: Reported on 10/13/2020), Disp: 60 tablet, Rfl: 0   pravastatin (PRAVACHOL) 20 MG tablet, Take 1 tablet (20 mg total) by mouth every evening.,  Disp: 30 tablet, Rfl: 0   pravastatin (PRAVACHOL) 20 MG tablet, TAKE 1 TABLET (20 MG TOTAL) BY MOUTH EVERY EVENING. (Patient not taking: Reported on 10/13/2020), Disp: 30 tablet, Rfl: 0   rifabutin (MYCOBUTIN) 150 MG capsule, Take 2 capsules (300 mg total) by mouth daily. (Patient not taking: Reported on 10/13/2020), Disp: 60 capsule, Rfl: 11   rifabutin (MYCOBUTIN) 150 MG capsule, TAKE 2 CAPSULES (300 MG TOTAL) BY MOUTH DAILY. (Patient not taking: Reported on 10/13/2020), Disp: 30 capsule, Rfl: 11  Exam: Current vital signs: BP (!) 146/97   Pulse 64   Temp 98.1 F (36.7 C) (Oral)   Resp (!) 26   Ht _0  (1.854 m)   Wt 77.1 kg   SpO2 96%   BMI 22.43 kg/m  Vital signs in last 24 hours: Temp:  [98.1 F (36.7 C)] 98.1 F (36.7 C) (09/10 1140) Pulse Rate:  [64-121] 64 (09/10 1400) Resp:  [14-28] 26 (09/10 1400) BP: (116-153)/(75-102) 146/97 (09/10 1400) SpO2:  [91 %-100 %] 96 % (09/10 1400) Weight:  [77.1 kg] 77.1 kg (09/10 1140)  General exam: HEENT: [place]/AT. Symmetric pink rash to midline forehead, nose and cheeks.  Abdomen: Non-distended Ext: No edema  Neurological exam: Ment:  Awake, alert and oriented. Speech fluent with intact comprehension. Has difficulty recalling several details of the time course of his symptoms, as well as timing of when he became noncompliant with his meds. Some tangentiality noted.  CN: PERRL. EOMI. Temporal visual fields intact with no extinction to DSS. Face symmetric. Phonation intact. Head is midline. Tongue protrudes midline.  Motor: 5/5 BUE and BLE No pronator drift.  Sensory: Intact to FT in all 4 extremities. No extinction to DSS.  Reflexes: Normoactive x 4 without asymmetry Cerebellar: No ataxia with H-S or FNF bilaterally Gait: Deferred.  Labs I have reviewed labs in epic and the results pertinent to this consultation are: CBC    Component Value Date/Time   WBC 4.8 10/13/2020 0602   RBC 3.89 (L) 10/13/2020 0602   HGB 13.5 10/13/2020 0602    HGB 12.9 (L) 04/15/2020 0405   HCT 40.4 10/13/2020 0602   PLT 314 10/13/2020 0602   MCV 103.9 (H) 10/13/2020 0602   MCH 34.7 (H) 10/13/2020 0602   MCHC 33.4 10/13/2020 0602   RDW 12.2 10/13/2020 0602   LYMPHSABS 0.3 (L) 10/13/2020 0602   MONOABS 0.6 10/13/2020 0602   EOSABS 0.0 10/13/2020 0602   BASOSABS 0.0 10/13/2020 0602  CMP     Component Value Date/Time   NA 128 (L) 10/13/2020 0602   K 3.8 10/13/2020 0602   CL 99 10/13/2020 0602   CO2 22 10/13/2020 0602   GLUCOSE 149 (H) 10/13/2020 0602   BUN 11 10/13/2020 0602   CREATININE 1.00 10/13/2020 0602   CALCIUM 8.1 (L) 10/13/2020 0602   PROT 7.3 10/13/2020 0602   ALBUMIN 2.2 (L) 10/13/2020 0602   AST 47 (H) 10/13/2020 0602   ALT 15 10/13/2020 0602   ALKPHOS 106 10/13/2020 0602   BILITOT 1.4 (H) 10/13/2020 0602   GFRNONAA >60 10/13/2020 0602  Lipid Panel     Component Value Date/Time   CHOL 141 03/29/2020 0255   TRIG 118 03/29/2020 0255   HDL 31 (L) 03/29/2020 0255   CHOLHDL 4.5 03/29/2020 0255   VLDL 24 03/29/2020 0255   LDLCALC 86 03/29/2020 0255  Urinalysis    Component Value Date/Time   COLORURINE YELLOW 10/12/2020 2323   APPEARANCEUR CLEAR 10/12/2020 2323   LABSPEC 1.010 10/12/2020 2323   PHURINE 6.0 10/12/2020 2323   GLUCOSEU NEGATIVE 10/12/2020 2323   HGBUR TRACE (A) 10/12/2020 2323   BILIRUBINUR NEGATIVE 10/12/2020 2323   KETONESUR NEGATIVE 10/12/2020 2323   PROTEINUR NEGATIVE 10/12/2020 2323   NITRITE NEGATIVE 10/12/2020 2323   LEUKOCYTESUR SMALL (A) 10/12/2020 2323  Drugs of Abuse     Component Value Date/Time   LABOPIA NONE DETECTED 10/12/2020 2324   COCAINSCRNUR POSITIVE (A) 10/12/2020 2324   LABBENZ NONE DETECTED 10/12/2020 2324   AMPHETMU NONE DETECTED 10/12/2020 2324   THCU POSITIVE (A) 10/12/2020 2324   LABBARB NONE DETECTED 10/12/2020 2324    Imaging I have reviewed the images obtained. See assessment below.   Assessment: 65 y.o. male with recent history of headaches with blurry vision  secondary to cryptococcal meningitis in the setting of HIV/AIDS and left lung cavitary lesions in March of 2022 who re-presented to the ED 10/12/2020 for evaluation of cough, shortness of breath, and low grade fever x 1 month with progression x 1 week, in conjunction with a 1 month history of intermittent headaches which have been steadily worsening. Patient also with complaints of blurry vision, unclear if worse from his chronic blurry vision. - Examination reveals no focal deficit, although  the patient has poor memory for his medical history and recent events and is also somewhat tangential.  - MRI brain with and without contrast reveals no evidence of acute intracranial abnormality. Similar trace bilateral anterior frontal convexity subdural hematomas are noted in comparison with the prior study. Also seen is paranasal sinus disease with air-fluid level in the left maxillary sinus. - Work up in the ED is significant for patient testing positive for COVID-19, CD4 pending, hyponatremia with a sodium of 126, UDS positive for cocaine and THC, and pending blood cultures.  - Presentation concerning for persistent or recurrent cryptococcal meningitis in the setting of HIV/AIDS. Repeat LP pending for further evaluation of opening pressure with CSF studies for cryptococcal meningitis.    Recommendations: 1. Consider LP in the morning. Will need cryptococcal antigen, cell count with differential, protein, glucose, fungal culture, bacterial culture, VDRL, AFB culture and smear, and gram stain, as well as opening pressure. If opening pressure on LP is elevated (> 20 cm water), will need to remove CSF to a closing pressure of 15.  2. ID has been consulted.   I have seen and examined the patient. I have formulated the assessment and plan. HPI documented by Anibal Henderson, NP.  Electronically signed: Dr. Kerney Elbe

## 2020-10-13 NOTE — ED Notes (Signed)
Attempted report X1

## 2020-10-13 NOTE — Consult Note (Addendum)
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 Alcohol and drug abuse  Plan:  Agree with remdesivir for now Airborne precautions I ordered a serum cryptococcal antigen but he needs a lumbar puncture with documented opening pressure and efficient CSF removed to drop the closing pressure below 20 cm of water, CSF should be sent for protein glucose cell count differential gram stain culture stat cryptococcal antigen, CSF fungal cultures, RPR, and remainder of CSF should be saved for further studies (in case MRI shows suggestion of Toxoplasmosis, or PML in which case Toxoplasma PCR and JC virus could be sent. 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 Clearly also needs treatment for his drug and alcohol abuse  Active Problems:   HIV infection (HCC)   Mycobacterium avium complex ([place])   COVID-19   COVID-19 virus  infection   Scheduled Meds:  dextromethorphan-guaiFENesin  1 tablet Oral BID   [START ON 10/14/2020] enoxaparin (LOVENOX) injection  40 mg Subcutaneous Q24H   fluticasone  1 spray Each Nare Daily   folic acid  1 mg Intravenous Daily   Continuous Infusions:  remdesivir 100 mg in NS 100 mL Stopped (10/13/20 1112)   PRN Meds:.acetaminophen  HPI: Ricky [name] is a 65 y.o. male with HIV and AIDS who has not been adherent to medical therapy also history of pulmonary tuberculosis treated many years ago who was admitted this spring to the hospital with cryptococcal meningitis requiring serial LPs and induction therapy with Amphotericin and flucytosine followed by fluconazole.  During that hospitalization he also had 2 sputum sent for AFB culture which were positive for Mycobacterium AVM.  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  We had intended to start antiviral therapy after 5 weeks of him being treated for cryptococcal meningitis and he was scheduled for follow-up with Dr. Gale Journey in our clinic but never made it and stopped taking all of his medications.   He now presents to the ER with worsening chest pain fever cough and fatigue that initially began a month ago but had worsened.  In the ER he tested positive for COVID-19 he had a CT angiogram to look for pulmonary embolism but did not show a pulmonary  embolism instead it showed some nodular opacities predominantly in the left lung that were improved compared to his prior CT scan.  There were also some new areas of nodularity in the left lower lobe.  Patient to his fever and cough he is endorsed headaches with blurry vision of the headaches and blurry vision or not constant they do come and go.  Given his prior cryptococcal  meningitis and his severely compromised immune system I am quite concerned that he could have had recurrence of his cryptococcal meningitis.  I have sent off a serum cryptococcal antigen but I think clearly he needs a lumbar puncture as described above.  Certainly could have Mycobacterium AVM of the lungs.  There could be other explanations however for his lung nodule such as cryptococcus itself.  Furthermore I think it is too much to ask of him to take an antifungal for his cryptococcal meningitis as well as PCP prophylaxis consistently enough to get him back onto antiviral therapy AND to ask him to take treatment for Mycobacterium AVM on top of this. This is going to be too much and introduces the problem of polypharmacy.  Certainly hen is not going to die from Mycobacterium AVM infection so treating it is the lowest of my priorities at this time.  Admits that alcohol and drugs are one of the main reasons he is not been consistently in care though his description of his alcohol consumption imust be a  dramatic understatement.  Would benefit from being connected to our bridge counselor so he can actually come to an appointment.  He claims to not have transportation.  He does have a sister who helps him but she does not know of his HIV diagnosis.  It is unclear whether his COVID-19 infection is acute or chronic.  Certainly with his immune system decimated he would not be very capable of clearing COVID-19 and could harbor a virus for months if not a year with subsequent mutations and new variants emerging.  I would make sure he is on airborne precautions. I will ask lab for cycle threshold though this will still not answer question of whether this is acute because virus could be on the upward trend of replication.  I spent 84 minutes with the patient including than 50% of the time in face to face counseling of the patient guarding the nature of COVID-19 infection cryptococcal meningitis HIV and  AIDS needs to take his antiretroviral therapy his problems with substance abuse, personally reviewing CT angiogram his admission labs including CMP and CBC viral load of 68,400 and March 29, 2000 CD4 count that was not detected at that time along with review of medical records in preparation for the visit and during the visit and in coordination of his care.          Review of Systems: Review of Systems  Constitutional:  Positive for malaise/fatigue and weight loss. Negative for chills and fever.  HENT:  Negative for congestion and sore throat.   Eyes:  Positive for blurred vision. Negative for photophobia.  Respiratory:  Positive for cough. Negative for shortness of breath and wheezing.   Cardiovascular:  Positive for chest pain. Negative for palpitations and leg swelling.  Gastrointestinal:  Negative for abdominal pain, blood in stool, constipation, diarrhea, heartburn, melena, nausea and vomiting.  Genitourinary:  Negative for dysuria, flank pain and hematuria.  Musculoskeletal:  Positive for myalgias. Negative for back pain, falls and joint pain.  Skin:  Negative for itching and rash.  Neurological:  Positive for headaches. Negative for dizziness, focal weakness, loss of consciousness and weakness.  Endo/Heme/Allergies:  Does not bruise/bleed easily.  Psychiatric/Behavioral:  Positive for depression and substance abuse. Negative for suicidal ideas. The patient does not have insomnia.    Past Medical History:  Diagnosis Date   Human immunodeficiency virus (HIV) disease ([place]) 03/26/2020    Social History   Tobacco Use   Smoking status: Every Day    Types: Cigarettes   Smokeless tobacco: Former  Substance Use Topics   Alcohol use: Yes    Alcohol/week: 14.0 - 21.0 standard drinks    Types: 14 - 21 Cans of beer per week   Drug use: Not Currently    Family History  Problem Relation Age of Onset   Hypertension Mother    Kidney disease Mother    Hypertension Sister     Hypertension Brother    Allergies  Allergen Reactions   Bactrim [Sulfamethoxazole-Trimethoprim] Other (See Comments)    Patient was trialed on DS TIW and SS daily for PJP prophylaxis and developed hyperkalemia and increased Scr    OBJECTIVE: Blood pressure (!) 146/97, pulse 64, [name], temperature source Oral, resp. rate (!) 26, height _0  (1.854 m), weight 77.1 kg, SpO2 96 %.  Physical Exam Constitutional:      General: He is not in acute distress.    Appearance: He is not diaphoretic.  HENT:     Head: Normocephalic and atraumatic.     Right Ear: External ear normal.     Left Ear: External ear normal.     Nose: Nose normal.     Mouth/Throat:     Pharynx: No oropharyngeal exudate.  Eyes:     General: No scleral icterus.       Right eye: No discharge.        Left eye: No discharge.     Extraocular Movements: Extraocular movements intact.     Conjunctiva/sclera: Conjunctivae normal.  Cardiovascular:     Rate and Rhythm: Normal rate and regular rhythm.  Pulmonary:     Effort: Pulmonary effort is normal. No respiratory distress.     Breath sounds: No wheezing or rales.  Abdominal:     General: There is no distension.     Palpations: Abdomen is soft.     Tenderness: There is no rebound.  Musculoskeletal:        General: No tenderness. Normal range of motion.     Cervical back: Normal range of motion and neck supple.  Lymphadenopathy:     Cervical: No cervical adenopathy.  Skin:    General: Skin is warm and dry.     Coloration: Skin is not jaundiced or pale.     Findings: No erythema, lesion or rash.  Neurological:     General: No focal deficit present.     Mental Status: He is alert and oriented to person, place, and time.     Coordination: Coordination normal.  Psychiatric:        Attention and Perception: Attention and perception normal.        Mood and Affect: Mood is depressed.        Speech: Speech normal.        Behavior: Behavior  normal. Behavior is cooperative.        Thought Content: Thought content normal.        Judgment: Judgment normal.    Lab Results Lab Results  Component Value Date   WBC 4.8  10/13/2020   HGB 13.5 10/13/2020   HCT 40.4 10/13/2020   MCV 103.9 (H) 10/13/2020   PLT 314 10/13/2020    Lab Results  Component Value Date   CREATININE 1.00 10/13/2020   BUN 11 10/13/2020   NA 128 (L) 10/13/2020   K 3.8 10/13/2020   CL 99 10/13/2020   CO2 22 10/13/2020    Lab Results  Component Value Date   ALT 15 10/13/2020   AST 47 (H) 10/13/2020   ALKPHOS 106 10/13/2020   BILITOT 1.4 (H) 10/13/2020     Microbiology: Recent Results (from the past 240 hour(s))  Resp Panel by RT-PCR (Flu A&B, Covid) Nasopharyngeal Swab     Status: Abnormal   Collection Time: 10/12/20 11:34 AM   Specimen: Nasopharyngeal Swab; Nasopharyngeal(NP) swabs in vial transport medium  Result Value Ref Range Status   SARS Coronavirus 2 by RT PCR POSITIVE (A) NEGATIVE Final    Comment: RESULT CALLED TO, READ BACK BY AND VERIFIED WITH: Edwyna Shell RN [id number] 10/12/20 A BROWNING (NOTE) SARS-CoV-2 target nucleic acids are DETECTED.  The SARS-CoV-2 RNA is generally detectable in upper respiratory specimens during the acute phase of infection. Positive results are indicative of the presence of the identified virus, but do not rule out bacterial infection or co-infection with other pathogens not detected by the test. Clinical correlation with patient history and other diagnostic information is necessary to determine patient infection status. The expected result is Negative.  Fact Sheet for Patients: EntrepreneurPulse.com.au  Fact Sheet for Healthcare Providers: IncredibleEmployment.be  This test is not yet approved or cleared by the [place] FDA and  has been authorized for detection and/or diagnosis of SARS-CoV-2 by FDA under an Emergency Use Authorization (EUA).  This EUA will remain  in effect (meaning this test ca n be used) for the duration of  the COVID-19 declaration under Section 564(b)(1) of the Act, 21 U.S.C. section 360bbb-3(b)(1), unless the authorization is terminated or revoked sooner.     Influenza A by PCR NEGATIVE NEGATIVE Final   Influenza B by PCR NEGATIVE NEGATIVE Final    Comment: (NOTE) The Xpert Xpress SARS-CoV-2/FLU/RSV plus assay is intended as an aid in the diagnosis of influenza from Nasopharyngeal swab specimens and should not be used as a sole basis for treatment. Nasal washings and aspirates are unacceptable for Xpert Xpress SARS-CoV-2/FLU/RSV testing.  Fact Sheet for Patients: EntrepreneurPulse.com.au  Fact Sheet for Healthcare Providers: IncredibleEmployment.be  This test is not yet approved or cleared by the [place] FDA and has been authorized for detection and/or diagnosis of SARS-CoV-2 by FDA under an Emergency Use Authorization (EUA). This EUA will remain in effect (meaning this test can be used) for the duration of the COVID-19 declaration under Section 564(b)(1) of the Act, 21 U.S.C. section 360bbb-3(b)(1), unless the authorization is terminated or revoked.  Performed at [facility], [place] [address], [place], [place] [address]   Blood culture (routine x 2)     Status: None (Preliminary result)   Collection Time: 10/12/20  2:45 PM   Specimen: BLOOD LEFT FOREARM  Result Value Ref Range Status   Specimen Description BLOOD LEFT FOREARM  Final   Special Requests   Final    BOTTLES DRAWN AEROBIC AND ANAEROBIC Blood Culture adequate volume   Culture   Final    NO GROWTH < 24 HOURS Performed at [facility], [place] [address] [place], [place] [address]    Report Status PENDING  Incomplete  Blood culture (  routine x 2)     Status: None (Preliminary result)   Collection Time: 10/12/20  5:10 PM   Specimen: BLOOD RIGHT WRIST  Result Value Ref Range Status   Specimen  Description BLOOD RIGHT WRIST  Final   Special Requests   Final    BOTTLES DRAWN AEROBIC AND ANAEROBIC Blood Culture adequate volume   Culture   Final    NO GROWTH < 24 HOURS Performed at [name], [place] [address], [place], [place] [address]    Report Status PENDING  Incomplete    Alcide Evener, [place] [facility] [phone] pager  10/13/2020, 4:14 PM

## 2020-10-14 DIAGNOSIS — Z91199 Patient's noncompliance with other medical treatment and regimen due to unspecified reason: Secondary | ICD-10-CM

## 2020-10-14 DIAGNOSIS — U071 COVID-19: Secondary | ICD-10-CM | POA: Diagnosis not present

## 2020-10-14 DIAGNOSIS — A310 Pulmonary mycobacterial infection: Secondary | ICD-10-CM | POA: Diagnosis not present

## 2020-10-14 DIAGNOSIS — J189 Pneumonia, unspecified organism: Secondary | ICD-10-CM | POA: Diagnosis not present

## 2020-10-14 DIAGNOSIS — F1010 Alcohol abuse, uncomplicated: Secondary | ICD-10-CM | POA: Diagnosis not present

## 2020-10-14 DIAGNOSIS — B459 Cryptococcosis, unspecified: Secondary | ICD-10-CM

## 2020-10-14 DIAGNOSIS — Z9119 Patient's noncompliance with other medical treatment and regimen: Secondary | ICD-10-CM

## 2020-10-14 DIAGNOSIS — B20 Human immunodeficiency virus [HIV] disease: Secondary | ICD-10-CM | POA: Diagnosis not present

## 2020-10-14 DIAGNOSIS — B451 Cerebral cryptococcosis: Secondary | ICD-10-CM | POA: Diagnosis not present

## 2020-10-14 LAB — COMPREHENSIVE METABOLIC PANEL WITH GFR
ALT: 17 U/L (ref 0–44)
AST: 50 U/L — ABNORMAL HIGH (ref 15–41)
Albumin: 2.1 g/dL — ABNORMAL LOW (ref 3.5–5.0)
Alkaline Phosphatase: 99 U/L (ref 38–126)
Anion gap: 8 (ref 5–15)
BUN: 14 mg/dL (ref 8–23)
CO2: 21 mmol/L — ABNORMAL LOW (ref 22–32)
Calcium: 8.3 mg/dL — ABNORMAL LOW (ref 8.9–10.3)
Chloride: 99 mmol/L (ref 98–111)
Creatinine, Ser: 0.95 mg/dL (ref 0.61–1.24)
GFR, Estimated: >60 mL/min
Glucose, Bld: 139 mg/dL — ABNORMAL HIGH (ref 70–99)
Potassium: 3.6 mmol/L (ref 3.5–5.1)
Sodium: 128 mmol/L — ABNORMAL LOW (ref 135–145)
Total Bilirubin: 1.3 mg/dL — ABNORMAL HIGH (ref 0.3–1.2)
Total Protein: 7.1 g/dL (ref 6.5–8.1)

## 2020-10-14 LAB — CBC WITH DIFFERENTIAL/PLATELET
Abs Immature Granulocytes: 0.17 10*3/uL — ABNORMAL HIGH (ref 0.00–0.07)
Basophils Absolute: 0 10*3/uL (ref 0.0–0.1)
Basophils Relative: 1 %
Eosinophils Absolute: 0.1 10*3/uL (ref 0.0–0.5)
Eosinophils Relative: 2 %
HCT: 38.5 % — ABNORMAL LOW (ref 39.0–52.0)
Hemoglobin: 13.3 g/dL (ref 13.0–17.0)
Immature Granulocytes: 5 %
Lymphocytes Relative: 11 %
Lymphs Abs: 0.4 10*3/uL — ABNORMAL LOW (ref 0.7–4.0)
MCH: 34.2 pg — ABNORMAL HIGH (ref 26.0–34.0)
MCHC: 34.5 g/dL (ref 30.0–36.0)
MCV: 99 fL (ref 80.0–100.0)
Monocytes Absolute: 0.8 10*3/uL (ref 0.1–1.0)
Monocytes Relative: 21 %
Neutro Abs: 2.3 10*3/uL (ref 1.7–7.7)
Neutrophils Relative %: 60 %
Platelets: 236 10*3/uL (ref 150–400)
RBC: 3.89 MIL/uL — ABNORMAL LOW (ref 4.22–5.81)
RDW: 11.8 % (ref 11.5–15.5)
WBC: 3.7 10*3/uL — ABNORMAL LOW (ref 4.0–10.5)
nRBC: 0 % (ref 0.0–0.2)

## 2020-10-14 LAB — FOLATE: Folate: 14.9 ng/mL

## 2020-10-14 LAB — VITAMIN B12: Vitamin B-12: 540 pg/mL (ref 180–914)

## 2020-10-14 MED ORDER — ENOXAPARIN SODIUM 40 MG/0.4ML IJ SOSY
40.0000 mg | PREFILLED_SYRINGE | INTRAMUSCULAR | Status: DC
  Administered 2020-10-14 – 2020-10-15 (×2): 40 mg via SUBCUTANEOUS
  Filled 2020-10-14 (×2): qty 0.4

## 2020-10-14 MED ORDER — ATOVAQUONE 750 MG/5ML PO SUSP
1500.0000 mg | Freq: Every day | ORAL | Status: DC
  Administered 2020-10-15: 1500 mg via ORAL
  Filled 2020-10-14 (×2): qty 10

## 2020-10-14 MED ORDER — FLUCONAZOLE 100 MG PO TABS
800.0000 mg | ORAL_TABLET | Freq: Every day | ORAL | Status: DC
  Administered 2020-10-14 – 2020-10-15 (×2): 800 mg via ORAL
  Filled 2020-10-14 (×2): qty 8

## 2020-10-14 NOTE — Progress Notes (Signed)
Subjective: He states his headache is 1/10 and improved with sinus med he was given. He thinks that it is a sinus headache.    Objective: Current vital signs: BP 129/86 (BP Location: Left Arm)   Pulse 72   Temp 98.8 F (37.1 C) (Oral)   Resp 18   Ht '6\' 1"'$  (1.854 m)   Wt 77.1 kg   SpO2 99%   BMI 22.43 kg/m  Vital signs in last 24 hours: Temp:  [98.1 F (36.7 C)-98.8 F (37.1 C)] 98.8 F (37.1 C) (09/11 1238) Pulse Rate:  [60-76] 72 (09/11 1238) Resp:  [14-26] 18 (09/11 1238) BP: (126-156)/(73-97) 129/86 (09/11 1238) SpO2:  [95 %-99 %] 99 % (09/11 1238)  Intake/Output from previous day: 09/10 0701 - 09/11 0700 In: 287.3 [IV Piggyback:287.3] Out: -  Intake/Output this shift: No intake/output data recorded. Nutritional status:  Diet Order             Diet regular Room service appropriate? No; Fluid consistency: Thin  Diet effective now                  Gen: Nondiaphoretic, NAD, respirations unlabored HEENT: [place]/AT Ext: No edema  Neurologic Exam: Ment: Awake and alert. Speech fluent with intact comprehension. Easily agitated.  CN: Pupils equal. EOMI. Fixates and tracks normally. Face symmetric.  Motor: No motor deficit appreciated. Cerebellar: No ataxia noted.   Lab Results: Results for orders placed or performed during the hospital encounter of 10/12/20 (from the past 48 hour(s))  Troponin I (High Sensitivity)     Status: None   Collection Time: 10/12/20  2:45 PM  Result Value Ref Range   Troponin I (High Sensitivity) 11 <18 ng/L    Comment: (NOTE) Elevated high sensitivity troponin I (hsTnI) values and significant  changes across serial measurements may suggest ACS but many other  chronic and acute conditions are known to elevate hsTnI results.  Refer to the "Links" section for chest pain algorithms and additional  guidance. Performed at [facility], [place] [address], [place], [place] [address]   Blood culture (routine x 2)     Status: None  (Preliminary result)   Collection Time: 10/12/20  2:45 PM   Specimen: BLOOD LEFT FOREARM  Result Value Ref Range   Specimen Description BLOOD LEFT FOREARM    Special Requests      BOTTLES DRAWN AEROBIC AND ANAEROBIC Blood Culture adequate volume   Culture      NO GROWTH < 24 HOURS Performed at [facility], [place] [address], [place], [place] [address]    Report Status PENDING   Blood culture (routine x 2)     Status: None (Preliminary result)   Collection Time: 10/12/20  5:10 PM   Specimen: BLOOD RIGHT WRIST  Result Value Ref Range   Specimen Description BLOOD RIGHT WRIST    Special Requests      BOTTLES DRAWN AEROBIC AND ANAEROBIC Blood Culture adequate volume   Culture      NO GROWTH < 24 HOURS Performed at [facility], [place] [address], [place], [place] [address]    Report Status PENDING   Urinalysis, Routine w reflex microscopic     Status: Abnormal   Collection Time: 10/12/20 11:23 PM  Result Value Ref Range   Color, Urine YELLOW YELLOW   APPearance CLEAR CLEAR   Specific Gravity, Urine 1.010 1.005 - 1.030   pH 6.0 5.0 - 8.0   Glucose, UA NEGATIVE NEGATIVE mg/dL  Hgb urine dipstick TRACE (A) NEGATIVE   Bilirubin Urine NEGATIVE NEGATIVE   Ketones, ur NEGATIVE NEGATIVE mg/dL   Protein, ur NEGATIVE NEGATIVE mg/dL   Nitrite NEGATIVE NEGATIVE   Leukocytes,Ua SMALL (A) NEGATIVE    Comment: Performed at [facility] [address], [place], [place] [address]  Urinalysis, Microscopic (reflex)     Status: None   Collection Time: 10/12/20 11:23 PM  Result Value Ref Range   RBC / HPF 0-5 0 - 5 RBC/hpf   WBC, UA 0-5 0 - 5 WBC/hpf   Bacteria, UA NONE SEEN NONE SEEN   Squamous Epithelial / LPF 0-5 0 - 5    Comment: Performed at [facility], [place] [address] [place], [place] [address]  Urine rapid drug screen (hosp performed)     Status: Abnormal   Collection Time: 10/12/20 11:24 PM  Result Value Ref Range   Opiates NONE DETECTED NONE DETECTED   Cocaine  POSITIVE (A) NONE DETECTED   Benzodiazepines NONE DETECTED NONE DETECTED   Amphetamines NONE DETECTED NONE DETECTED   Tetrahydrocannabinol POSITIVE (A) NONE DETECTED   Barbiturates NONE DETECTED NONE DETECTED    Comment: (NOTE) DRUG SCREEN FOR MEDICAL PURPOSES ONLY.  IF CONFIRMATION IS NEEDED FOR ANY PURPOSE, NOTIFY LAB WITHIN 5 DAYS.  LOWEST DETECTABLE LIMITS FOR URINE DRUG SCREEN Drug Class                     Cutoff (ng/mL) Amphetamine and metabolites    1000 Barbiturate and metabolites    200 Benzodiazepine                 [id number] Tricyclics and metabolites     300 Opiates and metabolites        300 Cocaine and metabolites        300 THC                            50 Performed at [facility], [place] [address], [place], [place] [address]   Osmolality, urine     Status: None   Collection Time: 10/13/20 12:36 AM  Result Value Ref Range   Osmolality, Ur 496 300 - 900 mOsm/kg    Comment: Performed at [facility] [address], [place], [place] [address]  Sodium, urine, random     Status: None   Collection Time: 10/13/20 12:36 AM  Result Value Ref Range   Sodium, Ur 63 mmol/L    Comment: Performed at [facility] [address], [place], [place] [address]  CBC with Differential/Platelet     Status: Abnormal   Collection Time: 10/13/20  6:02 AM  Result Value Ref Range   WBC 4.8 4.0 - 10.5 K/uL   RBC 3.89 (L) 4.22 - 5.81 MIL/uL   Hemoglobin 13.5 13.0 - 17.0 g/dL   HCT 40.4 39.0 - 52.0 %   MCV 103.9 (H) 80.0 - 100.0 fL   MCH 34.7 (H) 26.0 - 34.0 pg   MCHC 33.4 30.0 - 36.0 g/dL   RDW 12.2 11.5 - 15.5 %   Platelets 314 150 - 400 K/uL   nRBC 0.0 0.0 - 0.2 %   Neutrophils Relative % 80 %   Neutro Abs 3.8 1.7 - 7.7 K/uL   Lymphocytes Relative 7 %   Lymphs Abs 0.3 (L) 0.7 - 4.0 K/uL   Monocytes Relative 13 %   Monocytes Absolute 0.6  0.1 - 1.0 K/uL   Eosinophils Relative 0 %   Eosinophils Absolute 0.0 0.0 - 0.5 K/uL   Basophils Relative 0 %   Basophils  Absolute 0.0 0.0 - 0.1 K/uL   nRBC 0 0 /100 WBC   Abs Immature Granulocytes 0.00 0.00 - 0.07 K/uL    Comment: Performed at [facility], [place] [address], [place], [place] [address]  Comprehensive metabolic panel     Status: Abnormal   Collection Time: 10/13/20  6:02 AM  Result Value Ref Range   Sodium 128 (L) 135 - 145 mmol/L   Potassium 3.8 3.5 - 5.1 mmol/L   Chloride 99 98 - 111 mmol/L   CO2 22 22 - 32 mmol/L   Glucose, Bld 149 (H) 70 - 99 mg/dL    Comment: Glucose reference range applies only to samples taken after fasting for at least 8 hours.   BUN 11 8 - 23 mg/dL   Creatinine, Ser 1.00 0.61 - 1.24 mg/dL   Calcium 8.1 (L) 8.9 - 10.3 mg/dL   Total Protein 7.3 6.5 - 8.1 g/dL   Albumin 2.2 (L) 3.5 - 5.0 g/dL   AST 47 (H) 15 - 41 U/L   ALT 15 0 - 44 U/L   Alkaline Phosphatase 106 38 - 126 U/L   Total Bilirubin 1.4 (H) 0.3 - 1.2 mg/dL   GFR, Estimated >60 >60 mL/min    Comment: (NOTE) Calculated using the CKD-EPI Creatinine Equation (2021)    Anion gap 7 5 - 15    Comment: Performed at [facility], [place] [address] [place], [place] [address]  Cryptococcal antigen     Status: Abnormal   Collection Time: 10/13/20  6:43 PM  Result Value Ref Range   Crypto Ag POSITIVE (A) NEGATIVE    Comment: RESULT CALLED TO, READ BACK BY AND VERIFIED WITH: RN MELAINE ALEXANDER 10/13/20'@22'$ :11 BY TW    Cryptococcal Ag Titer 320 (A) NOT INDICATED    Comment: Performed at [facility] [address], [place], [place] [address]  CBC with Differential/Platelet     Status: Abnormal   Collection Time: 10/14/20  5:22 AM  Result Value Ref Range   WBC 3.7 (L) 4.0 - 10.5 K/uL   RBC 3.89 (L) 4.22 - 5.81 MIL/uL   Hemoglobin 13.3 13.0 - 17.0 g/dL   HCT 38.5 (L) 39.0 - 52.0 %   MCV 99.0 80.0 - 100.0 fL   MCH 34.2 (H) 26.0 - 34.0 pg   MCHC 34.5 30.0 - 36.0 g/dL   RDW 11.8 11.5 - 15.5 %   Platelets 236 150 - 400 K/uL   nRBC 0.0 0.0 - 0.2 %   Neutrophils Relative % 60 %   Neutro Abs 2.3  1.7 - 7.7 K/uL   Lymphocytes Relative 11 %   Lymphs Abs 0.4 (L) 0.7 - 4.0 K/uL   Monocytes Relative 21 %   Monocytes Absolute 0.8 0.1 - 1.0 K/uL   Eosinophils Relative 2 %   Eosinophils Absolute 0.1 0.0 - 0.5 K/uL   Basophils Relative 1 %   Basophils Absolute 0.0 0.0 - 0.1 K/uL   Immature Granulocytes 5 %   Abs Immature Granulocytes 0.17 (H) 0.00 - 0.07 K/uL    Comment: Performed at [facility] [address], [place], [place] [address]  Comprehensive metabolic panel     Status: Abnormal   Collection Time: 10/14/20  5:22 AM  Result Value Ref Range   Sodium 128 (  L) 135 - 145 mmol/L   Potassium 3.6 3.5 - 5.1 mmol/L   Chloride 99 98 - 111 mmol/L   CO2 21 (L) 22 - 32 mmol/L   Glucose, Bld 139 (H) 70 - 99 mg/dL    Comment: Glucose reference range applies only to samples taken after fasting for at least 8 hours.   BUN 14 8 - 23 mg/dL   Creatinine, Ser 0.95 0.61 - 1.24 mg/dL   Calcium 8.3 (L) 8.9 - 10.3 mg/dL   Total Protein 7.1 6.5 - 8.1 g/dL   Albumin 2.1 (L) 3.5 - 5.0 g/dL   AST 50 (H) 15 - 41 U/L   ALT 17 0 - 44 U/L   Alkaline Phosphatase 99 38 - 126 U/L   Total Bilirubin 1.3 (H) 0.3 - 1.2 mg/dL   GFR, Estimated >60 >60 mL/min    Comment: (NOTE) Calculated using the CKD-EPI Creatinine Equation (2021)    Anion gap 8 5 - 15    Comment: Performed at [facility], [place] [address], [place], [place] [address]  Folate, serum, performed at [facility] lab     Status: None   Collection Time: 10/14/20  5:22 AM  Result Value Ref Range   Folate 14.9 >5.9 ng/mL    Comment: Performed at [facility], [place] [address] [place], [place] [address]  Vitamin B12     Status: None   Collection Time: 10/14/20  5:22 AM  Result Value Ref Range   Vitamin B-12 540 180 - 914 pg/mL    Comment: (NOTE) This assay is not validated for testing neonatal or myeloproliferative syndrome specimens for Vitamin B12 levels. Performed at [facility], [place] [address] [place],  [place] [address]     Recent Results (from the past 240 hour(s))  Resp Panel by RT-PCR (Flu A&B, Covid) Nasopharyngeal Swab     Status: Abnormal   Collection Time: 10/12/20 11:34 AM   Specimen: Nasopharyngeal Swab; Nasopharyngeal(NP) swabs in vial transport medium  Result Value Ref Range Status   SARS Coronavirus 2 by RT PCR POSITIVE (A) NEGATIVE Final    Comment: RESULT CALLED TO, READ BACK BY AND VERIFIED WITH: Edwyna Shell RN [id number] 10/12/20 A BROWNING (NOTE) SARS-CoV-2 target nucleic acids are DETECTED.  The SARS-CoV-2 RNA is generally detectable in upper respiratory specimens during the acute phase of infection. Positive results are indicative of the presence of the identified virus, but do not rule out bacterial infection or co-infection with other pathogens not detected by the test. Clinical correlation with patient history and other diagnostic information is necessary to determine patient infection status. The expected result is Negative.  Fact Sheet for Patients: EntrepreneurPulse.com.au  Fact Sheet for Healthcare Providers: IncredibleEmployment.be  This test is not yet approved or cleared by the [place] FDA and  has been authorized for detection and/or diagnosis of SARS-CoV-2 by FDA under an Emergency Use Authorization (EUA).  This EUA will remain in effect (meaning this test ca n be used) for the duration of  the COVID-19 declaration under Section 564(b)(1) of the Act, 21 U.S.C. section 360bbb-3(b)(1), unless the authorization is terminated or revoked sooner.     Influenza A by PCR NEGATIVE NEGATIVE Final   Influenza B by PCR NEGATIVE NEGATIVE Final    Comment: (NOTE) The Xpert Xpress SARS-CoV-2/FLU/RSV plus assay is intended as an aid in the diagnosis of influenza from Nasopharyngeal swab specimens and should not be used as a sole basis for treatment. Nasal washings  and aspirates are unacceptable for Xpert Xpress  SARS-CoV-2/FLU/RSV testing.  Fact Sheet for Patients: EntrepreneurPulse.com.au  Fact Sheet for Healthcare Providers: IncredibleEmployment.be  This test is not yet approved or cleared by the [place] FDA and has been authorized for detection and/or diagnosis of SARS-CoV-2 by FDA under an Emergency Use Authorization (EUA). This EUA will remain in effect (meaning this test can be used) for the duration of the COVID-19 declaration under Section 564(b)(1) of the Act, 21 U.S.C. section 360bbb-3(b)(1), unless the authorization is terminated or revoked.  Performed at [facility], [place] [address], [place], [place] [address]   Blood culture (routine x 2)     Status: None (Preliminary result)   Collection Time: 10/12/20  2:45 PM   Specimen: BLOOD LEFT FOREARM  Result Value Ref Range Status   Specimen Description BLOOD LEFT FOREARM  Final   Special Requests   Final    BOTTLES DRAWN AEROBIC AND ANAEROBIC Blood Culture adequate volume   Culture   Final    NO GROWTH < 24 HOURS Performed at [facility], [place] [address] [place], [place] [address]    Report Status PENDING  Incomplete  Blood culture (routine x 2)     Status: None (Preliminary result)   Collection Time: 10/12/20  5:10 PM   Specimen: BLOOD RIGHT WRIST  Result Value Ref Range Status   Specimen Description BLOOD RIGHT WRIST  Final   Special Requests   Final    BOTTLES DRAWN AEROBIC AND ANAEROBIC Blood Culture adequate volume   Culture   Final    NO GROWTH < 24 HOURS Performed at [facility], [place] [address] [place], [place] [address]    Report Status PENDING  Incomplete    Lipid Panel No results for input(s): CHOL, TRIG, HDL, CHOLHDL, VLDL, LDLCALC in the last 72 hours.  Studies/Results: CT Angio Chest PE W and/or Wo Contrast  Result Date: 10/12/2020 CLINICAL DATA:  Shortness of breath EXAM: CT ANGIOGRAPHY CHEST WITH CONTRAST TECHNIQUE: Multidetector CT imaging  of the chest was performed using the standard protocol during bolus administration of intravenous contrast. Multiplanar CT image reconstructions and MIPs were obtained to evaluate the vascular anatomy. CONTRAST:  68m OMNIPAQUE IOHEXOL 350 MG/ML SOLN COMPARISON:  Chest CT dated March 27, 2020 FINDINGS: Cardiovascular: Normal heart size. No pericardial effusion. Adequate contrast opacification of the pulmonary arteries. No evidence of pulmonary embolus. Atherosclerotic disease of the thoracic aorta. Mediastinum/Nodes: Increased mediastinal adenopathy. Preference subcarinal lymph node measures 2.5 cm in short axis, previously 1.3. Reference AP window lymph node measures 1.6 cm in short axis, previously 0.7. Unchanged bilateral hilar lymph nodes. Reference left hilar lymph node measuring 1.2 cm in short axis, previously 1.2 cm. Lungs/Pleura: Central airways are patent. Centrilobular and paraseptal emphysema. Nodular and cavitary areas of airspace consolidation in the left upper and lower lobes, overall decreased compared to prior exam. Although there are new nodular opacities in the left lower lobe. Reference nodule measuring up to 1.3 cm on series 7, image 335, previously 0.6 cm. Unchanged bronchiectasis of the left upper lobe, likely due to prior infection. Upper Abdomen: Numerous calcifications of the pancreas, likely sequela of chronic pancreatitis. Layering sludge versus stones seen in the gallbladder. No acute findings. Musculoskeletal: No chest wall abnormality. No acute or significant osseous findings. Review of the MIP images confirms the above findings. IMPRESSION: No evidence of pulmonary embolus. Nodular opacities of the left lung are overall improved when compared with prior CT, although  there are some new areas of nodularity in the left lower lobe. 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 Mediastinal lymph nodes are increased in size compared to  prior exam, these may be reactive given presence of known infection. Recommend follow-up chest CT in 3 months. Aortic Atherosclerosis (ICD10-I70.0) and Emphysema (ICD10-J43.9). Electronically Signed   By: Yetta Glassman M.D.   On: 10/12/2020 16:28   MR BRAIN W WO CONTRAST  Result Date: 10/13/2020 CLINICAL DATA:  Meningitis/CNS infection suspected EXAM: MRI HEAD WITHOUT AND WITH CONTRAST TECHNIQUE: Multiplanar, multiecho pulse sequences of the brain and surrounding structures were obtained without and with intravenous contrast. CONTRAST:  6m GADAVIST GADOBUTROL 1 MMOL/ML IV SOLN COMPARISON:  MRI April 23, 2020. FINDINGS: Mildly motion limited exam.  Within this limitation: Brain: No change in trace extra-axial fluid anteriorly along the anterior frontal convexities, likely chronic hematomas (for example series 6, image 16). No acute infarct. No mass lesion, midline shift, or hydrocephalus. No abnormal enhancement. Vascular: Major arterial flow voids are maintained at the skull base. Skull and upper cervical spine: Normal marrow signal. Sinuses/Orbits: Moderate ethmoid air cell mucosal thickening. Mild left frontal sinus mucosal thickening. Air-fluid level in the left maxillary sinus. Other: No mastoid effusions. IMPRESSION: 1. No evidence of acute intracranial abnormality on this motion limited study. 2. Similar trace bilateral anterior frontal convexity subdural hematomas. 3. Paranasal sinus disease with air-fluid level in the left maxillary sinus. Electronically Signed   By: FMargaretha SheffieldM.D.   On: 10/13/2020 19:00    Medications: Scheduled:  [START ON 10/15/2020] atovaquone  1,500 mg Oral Q breakfast   dextromethorphan-guaiFENesin  1 tablet Oral BID   fluconazole  800 mg Oral Daily   fluticasone  1 spray Each Nare Daily   folic acid  1 mg Intravenous Daily   Continuous:  remdesivir 100 mg in NS 100 mL 100 mg (10/14/20 1109)     Assessment: 65y.o. 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 Patient also with complaints of blurry vision, unclear if worse from his chronic blurry vision. - Examination reveals no focal deficit. Speech intact. The patient is determined to be able to integrate the information required to make his own medical decisions.  - MRI brain with and without contrast reveals no evidence of acute intracranial abnormality. Similar trace bilateral anterior frontal convexity subdural hematomas are noted in comparison with the prior study. Also seen is paranasal sinus disease with air-fluid level in the left maxillary sinus. - Work up in the ED was significant for patient testing positive for COVID-19, CD4 pending, hyponatremia with a sodium of 126, UDS positive for cocaine and THC, and pending blood cultures. He has tested positive for cryptococcal antigen from blood draw.  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  Today, his headache is down to a 1/10 after he received sinus medication. He states that he now thinks that it is a sinus headache. He also states that the minor headache pain, which is centered around his sinuses does not resemble in any way the severe disabling 10/10 headache he  had prior to treatment of the cryptococcal meningitis that was diagnosed earlier this year; he states that at that time "my head felt like it was going to explode" whereas currently he is experiencing minor sinus discomfort.    Recommendations: - Risks/benefits of LP were discussed with the patient, for further evaluation of opening pressure with CSF studies  for cryptococcal meningitis. The patient stated that he did not want LP. After further discussion with ID consultant, the patient continued to refuse LP.  - See ID note for plan to start fluconazole. If blood cultures grow out cryptococcus (requires about 3 days), the ID consultant stated that he will need IV amphotericin. Clinically, he does not look like cryptococcal meningits and this may simply be a recurrent or not fully treated blood infection based on lab draw above. The patient states his headache is now 1/10 and has improved with sinus med he was given. He thinks that it is a sinus headache.     LOS: 1 day   '@Electronically'$  signed: Dr. Kerney Elbe 10/14/2020  12:58 PM

## 2020-10-14 NOTE — Progress Notes (Addendum)
Subjective:  Still complaining of chest tightness says headaches are better  Antibiotics:  Anti-infectives (From admission, onward)    Start     Dose/Rate Route Frequency Ordered Stop   10/14/20 1330  fluconazole (DIFLUCAN) tablet 800 mg        800 mg Oral Daily 10/14/20 1230     10/13/20 1700  vancomycin (VANCOREADY) IVPB 1500 mg/300 mL  Status:  Discontinued        1,500 mg 150 mL/hr over 120 Minutes Intravenous Every 24 hours 10/12/20 1656 10/13/20 1132   10/13/20 1000  remdesivir 100 mg in sodium chloride 0.9 % 100 mL IVPB       See Hyperspace for full Linked Orders Report.   100 mg 200 mL/hr over 30 Minutes Intravenous Daily 10/12/20 2323 10/17/20 0959   10/13/20 0100  ceFEPIme (MAXIPIME) 2 g in sodium chloride 0.9 % 100 mL IVPB  Status:  Discontinued        2 g 200 mL/hr over 30 Minutes Intravenous Every 8 hours 10/12/20 1656 10/13/20 1132   10/12/20 2359  remdesivir 200 mg in sodium chloride 0.9% 250 mL IVPB       See Hyperspace for full Linked Orders Report.   200 mg 580 mL/hr over 30 Minutes Intravenous Once 10/12/20 2323 10/13/20 0110   10/12/20 1700  ceFEPIme (MAXIPIME) 2 g in sodium chloride 0.9 % 100 mL IVPB        2 g 200 mL/hr over 30 Minutes Intravenous  Once 10/12/20 1648 10/12/20 1747   10/12/20 1700  vancomycin (VANCOREADY) IVPB 1500 mg/300 mL        1,500 mg 150 mL/hr over 120 Minutes Intravenous  Once 10/12/20 1649 10/12/20 1957       Medications: Scheduled Meds:  dextromethorphan-guaiFENesin  1 tablet Oral BID   fluconazole  800 mg Oral Daily   fluticasone  1 spray Each Nare Daily   folic acid  1 mg Intravenous Daily   Continuous Infusions:  remdesivir 100 mg in NS 100 mL 100 mg (10/14/20 1109)   PRN Meds:.acetaminophen    Objective: Weight change:  No intake or output data in the 24 hours ending 10/14/20 1232 Blood pressure (!) 156/92, pulse 76, temperature 98.5 F (36.9 C), temperature source Oral, resp. rate 14, height _0   (1.854 m), weight 77.1 kg, SpO2 97 %. Temp:  [98.1 F (36.7 C)-98.5 F (36.9 C)] 98.5 F (36.9 C) (09/11 0400) Pulse Rate:  [60-76] 76 (09/11 0400) Resp:  [14-26] 14 (09/10 1813) BP: (126-156)/(73-97) 156/92 (09/11 0400) SpO2:  [95 %-98 %] 97 % (09/11 0400)  Physical Exam: Physical Exam Vitals and nursing note reviewed.  Constitutional:      Appearance: He is ill-appearing.  HENT:     Head: Normocephalic and atraumatic.  Eyes:     General:        Right eye: No discharge.        Left eye: No discharge.     Extraocular Movements: Extraocular movements intact.     Conjunctiva/sclera: Conjunctivae normal.  Cardiovascular:     Rate and Rhythm: Normal rate and regular rhythm.  Pulmonary:     Effort: Pulmonary effort is normal. No respiratory distress.     Breath sounds: No wheezing.  Abdominal:     General: There is no distension.     Palpations: Abdomen is soft.  Musculoskeletal:        General: Normal range of motion.  Cervical back: Normal range of motion and neck supple.  Skin:    General: Skin is warm and dry.     Findings: No erythema or rash.  Neurological:     General: No focal deficit present.     Mental Status: He is alert and oriented to person, place, and time.  Psychiatric:        Attention and Perception: Attention normal.        Mood and Affect: Mood is depressed.        Behavior: Behavior is agitated.        Thought Content: Thought content normal.        Cognition and Memory: Cognition normal.        Judgment: Judgment normal.     CBC:    BMET Recent Labs    10/13/20 0602 10/14/20 0522  NA 128* 128*  K 3.8 3.6  CL 99 99  CO2 22 21*  GLUCOSE 149* 139*  BUN 11 14  CREATININE 1.00 0.95  CALCIUM 8.1* 8.3*     Liver Panel  Recent Labs    10/13/20 0602 10/14/20 0522  PROT 7.3 7.1  ALBUMIN 2.2* 2.1*  AST 47* 50*  ALT 15 17  ALKPHOS 106 99  BILITOT 1.4* 1.3*       Sedimentation Rate No results for input(s): ESRSEDRATE in the  last 72 hours. C-Reactive Protein No results for input(s): CRP in the last 72 hours.  Micro Results: Recent Results (from the past 720 hour(s))  Resp Panel by RT-PCR (Flu A&B, Covid) Nasopharyngeal Swab     Status: Abnormal   Collection Time: 10/12/20 11:34 AM   Specimen: Nasopharyngeal Swab; Nasopharyngeal(NP) swabs in vial transport medium  Result Value Ref Range Status   SARS Coronavirus 2 by RT PCR POSITIVE (A) NEGATIVE Final    Comment: RESULT CALLED TO, READ BACK BY AND VERIFIED WITH: Edwyna Shell RN [id number] 10/12/20 A BROWNING (NOTE) SARS-CoV-2 target nucleic acids are DETECTED.  The SARS-CoV-2 RNA is generally detectable in upper respiratory specimens during the acute phase of infection. Positive results are indicative of the presence of the identified virus, but do not rule out bacterial infection or co-infection with other pathogens not detected by the test. Clinical correlation with patient history and other diagnostic information is necessary to determine patient infection status. The expected result is Negative.  Fact Sheet for Patients: EntrepreneurPulse.com.au  Fact Sheet for Healthcare Providers: IncredibleEmployment.be  This test is not yet approved or cleared by the [place] FDA and  has been authorized for detection and/or diagnosis of SARS-CoV-2 by FDA under an Emergency Use Authorization (EUA).  This EUA will remain in effect (meaning this test ca n be used) for the duration of  the COVID-19 declaration under Section 564(b)(1) of the Act, 21 U.S.C. section 360bbb-3(b)(1), unless the authorization is terminated or revoked sooner.     Influenza A by PCR NEGATIVE NEGATIVE Final   Influenza B by PCR NEGATIVE NEGATIVE Final    Comment: (NOTE) The Xpert Xpress SARS-CoV-2/FLU/RSV plus assay is intended as an aid in the diagnosis of influenza from Nasopharyngeal swab specimens and should not be used as a sole basis for  treatment. Nasal washings and aspirates are unacceptable for Xpert Xpress SARS-CoV-2/FLU/RSV testing.  Fact Sheet for Patients: EntrepreneurPulse.com.au  Fact Sheet for Healthcare Providers: IncredibleEmployment.be  This test is not yet approved or cleared by the [place] FDA and has been authorized for detection and/or diagnosis of SARS-CoV-2 by FDA under an Emergency  Use Authorization (EUA). This EUA will remain in effect (meaning this test can be used) for the duration of the COVID-19 declaration under Section 564(b)(1) of the Act, 21 U.S.C. section 360bbb-3(b)(1), unless the authorization is terminated or revoked.  Performed at [facility], [place] [address] [place], [place] [address]   Blood culture (routine x 2)     Status: None (Preliminary result)   Collection Time: 10/12/20  2:45 PM   Specimen: BLOOD LEFT FOREARM  Result Value Ref Range Status   Specimen Description BLOOD LEFT FOREARM  Final   Special Requests   Final    BOTTLES DRAWN AEROBIC AND ANAEROBIC Blood Culture adequate volume   Culture   Final    NO GROWTH < 24 HOURS Performed at [facility], [place] [address], [place], [place] [address]    Report Status PENDING  Incomplete  Blood culture (routine x 2)     Status: None (Preliminary result)   Collection Time: 10/12/20  5:10 PM   Specimen: BLOOD RIGHT WRIST  Result Value Ref Range Status   Specimen Description BLOOD RIGHT WRIST  Final   Special Requests   Final    BOTTLES DRAWN AEROBIC AND ANAEROBIC Blood Culture adequate volume   Culture   Final    NO GROWTH < 24 HOURS Performed at [facility], [place] [address] [place], [place] [address]    Report Status PENDING  Incomplete    Studies/Results: CT Angio Chest PE W and/or Wo Contrast  Result Date: 10/12/2020 CLINICAL DATA:  Shortness of breath EXAM: CT ANGIOGRAPHY CHEST WITH CONTRAST TECHNIQUE: Multidetector CT imaging of the chest was performed  using the standard protocol during bolus administration of intravenous contrast. Multiplanar CT image reconstructions and MIPs were obtained to evaluate the vascular anatomy. CONTRAST:  2m OMNIPAQUE IOHEXOL 350 MG/ML SOLN COMPARISON:  Chest CT dated March 27, 2020 FINDINGS: Cardiovascular: Normal heart size. No pericardial effusion. Adequate contrast opacification of the pulmonary arteries. No evidence of pulmonary embolus. Atherosclerotic disease of the thoracic aorta. Mediastinum/Nodes: Increased mediastinal adenopathy. Preference subcarinal lymph node measures 2.5 cm in short axis, previously 1.3. Reference AP window lymph node measures 1.6 cm in short axis, previously 0.7. Unchanged bilateral hilar lymph nodes. Reference left hilar lymph node measuring 1.2 cm in short axis, previously 1.2 cm. Lungs/Pleura: Central airways are patent. Centrilobular and paraseptal emphysema. Nodular and cavitary areas of airspace consolidation in the left upper and lower lobes, overall decreased compared to prior exam. Although there are new nodular opacities in the left lower lobe. Reference nodule measuring up to 1.3 cm on series 7, image 335, previously 0.6 cm. Unchanged bronchiectasis of the left upper lobe, likely due to prior infection. Upper Abdomen: Numerous calcifications of the pancreas, likely sequela of chronic pancreatitis. Layering sludge versus stones seen in the gallbladder. No acute findings. Musculoskeletal: No chest wall abnormality. No acute or significant osseous findings. Review of the MIP images confirms the above findings. IMPRESSION: No evidence of pulmonary embolus. Nodular opacities of the left lung are overall improved when compared with prior CT, although there are some new areas of nodularity in the left lower lobe. 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 Mediastinal lymph nodes are increased in size compared to prior exam, these may be  reactive given presence of known infection. Recommend follow-up chest CT in 3 months. Aortic Atherosclerosis (ICD10-I70.0) and Emphysema (ICD10-J43.9). Electronically Signed   By: LYetta GlassmanM.D.   On: 10/12/2020  16:28   MR BRAIN W WO CONTRAST  Result Date: 10/13/2020 CLINICAL DATA:  Meningitis/CNS infection suspected EXAM: MRI HEAD WITHOUT AND WITH CONTRAST TECHNIQUE: Multiplanar, multiecho pulse sequences of the brain and surrounding structures were obtained without and with intravenous contrast. CONTRAST:  37m GADAVIST GADOBUTROL 1 MMOL/ML IV SOLN COMPARISON:  MRI April 23, 2020. FINDINGS: Mildly motion limited exam.  Within this limitation: Brain: No change in trace extra-axial fluid anteriorly along the anterior frontal convexities, likely chronic hematomas (for example series 6, image 16). No acute infarct. No mass lesion, midline shift, or hydrocephalus. No abnormal enhancement. Vascular: Major arterial flow voids are maintained at the skull base. Skull and upper cervical spine: Normal marrow signal. Sinuses/Orbits: Moderate ethmoid air cell mucosal thickening. Mild left frontal sinus mucosal thickening. Air-fluid level in the left maxillary sinus. Other: No mastoid effusions. IMPRESSION: 1. No evidence of acute intracranial abnormality on this motion limited study. 2. Similar trace bilateral anterior frontal convexity subdural hematomas. 3. Paranasal sinus disease with air-fluid level in the left maxillary sinus. Electronically Signed   By: FMargaretha SheffieldM.D.   On: 10/13/2020 19:00      Assessment/Plan:  INTERVAL HISTORY: Serum cryptococcal antigen and blood is 1-320 he is refusing lumbar puncture   Active Problems:   HIV infection ([place]   Mycobacterium avium complex ([place]   COVID-19   COVID-19 virus infection   Community acquired pneumonia    Ricky MINCEYis a 65y.o. 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  He is now admitted with chest pain and fever and cough and found to have COVID-19 infection.  He has not been taking his fluconazole and never was seen in clinic for follow-up and never initiated antiretroviral therapy.  He had endorsed headaches and some blurry vision initially but is minimizing the symptoms today and not wanting lumbar puncture.  His serum cryptococcal antigen came back positive at 1-320.  #1   COVID-19 infection:   Cycle threshold is 22.5 consistent with active infection.  Given his abysmal immune system it is not surprising that he is infectious and will continue to be so with difficulty clearing this virus. W  Continue remdesivir  Could consider maB with hope of clearing his COVID 19 he is a great host for new variants to emerge  #2 Hx of Cryptococcal meningitis:  He is refusing lumbar puncture.  I am starting him on fluconazole 800 mg daily  #3 HIV and AIDS: He never completed treatment for cryptococcal meningitis so that we could initiate antiretroviral therapy.  Start atovaquone for PCP prophylaxis he had low G6PD levels making dapsone not an option and had problems with hyperkalemia with Bactrim.  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 He does have chronic cough (could also be due to chronic covid, emphysema) but more importantly I do NOT think he will be capable of adhering to treatment for M avium in the lungs and his crypto and PCP prophylaxis and ARV and avoid drugs   #5  Drug use he is very vocal about needing to be in treatment or he is guaranteeing uKoreathat he relapsed with his drug use  and not be able to be adherent to therapy.  We will talk to Karn Pickler our bridge counselor with  central [place] with regards to helping him get to appointments and potentially helping him with a place to get treatment.  I do not know if there is a magical solution to finding him a better neighborhood to live and he lives with his sister in an area where apparently there is lots of drug use.  He does himself need to take some ownership over his behaviors  I spent 40  minutes with the patient including face to face counseling of the patient guarding my concerns about his cryptococcal meningitis his HIV and AIDS and risk of dying in the next few months to years personally reviewing arrive the brain as well as his positive cryptococcal antigen positive COVID-19 test, along with review of medical records before and during the visit and in coordination of his care with Neurology  NEW ID Team to take over service tomorrow.       LOS: 1 day   Alcide Evener 10/14/2020, 12:32 PM

## 2020-10-14 NOTE — Progress Notes (Signed)
Family Medicine Teaching Service Daily Progress Note Intern Pager: [phone]  Patient name: Ricky [name] Medical record number: [id number] Date of birth: [birth year] Age: 65 y.o. Gender: male  Primary Care Provider: Pcp, No Consultants: Infectious Disease, Neurology Code Status: Full Code  Pt Overview and Major Events to Date:  9/9 admitted 9/10 cefepime D/C'd; MRI brain showed no acute abnormalities, trace subdural hematomas, left maxillary sinus air fluid level  Assessment and Plan:  Ricky [name] is a 65 year old man presenting with cough, fever, and chest pain. PMH significant for cryptococcal menigits, HIV/AIDS, MAC, SVT s/p unsusccessful ablation,  CKD, HTN, dyslipidemia, routine calorie malnutrition, polysubstance abuse.  Fever with Headache, Covid-19, HIV/AIDs and immunocompromised state Says headache resolved this AM with mucinex and tylenol. Tmax of 100.1 on admission 9/9. Has been afebrile but complains of night sweats that soak the bed. Cough similar to before and worse at night. MRI of brain showed no acute abnormalities, trace subdural hematomas, left maxillary sinus air fluid level. Cryptococcal antigen test positive, titer 320. CD4 pending. WBC down to 3.7 from 4.8. Blood cultures NGTD <24 hours. -ID consulted, appreciate assistance; continuing with remdesivir course. Obtain CSF protein, glucose, cell count differential gram stain culture with cryptococcal antigen, fungal cultures, VDRL when doing LP. Not treating mycobacterium in lungs currently. 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 Discharged with azithromycin, rifabutin, and ethambutol but did not complete this course. CXR and CTA showed left upper lobe infiltration  -ID consulted and following, Infection vs. 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 He had previously had headaches which improved with LP. Headache today is resolved with tylenol and mucinex. -Monitor headache symptoms -ID and neurology following, considering LP this AM  Chronic hyponatremia  Sodium on admission was 126, today was 128. Likely related to excessive beer consumption. Urine Na 63 and Urine Osm was 496, normal. -AM CMP, monitor  Elevated LFT AST/ALT was 50/17 today elevated from 47/15. No abdominal pain, can consider RUQ [place] if worsening. -AM CMPs  Macrocytosis without anemia, resolved Hemoglobin has been stable at 13.3. MCV was normal today to 99.0. Bilirubin 1.3. Folate 14.9, B12 627, normal.  -folic acid injection 1 mg daily  SVT s/p unsuccessful ablation SVT in March and underwent ablation at that time which was unsuccessful. Has not been continuing his metoprolol outpatient. Regularly uses cocaine. EKG showed NSR with no ST changes. Heart rates have been 60-77.   -cardiac monitoring  Polysubstance use Drinks 2.5 40s per night, uses cocaine, and marijuana. In March UDS positive for amphetamines. Says he has no history of DTs. CIWA scores have been 0, 0, 0, and 1 most recently (headache). -TOC consult for substance abuse resources -CIWA protocol  Moderate protein calorie malnutrition Has history of chronic calcific pancreatitis and chronic malnutrition -RD consult  Hypertension Blood pressures have been 120s-150s since last night and this  AM -Monitor BPs  Medication management -TOC consult for help with PCP, HIV treatment, and substance use reosurces  FEN/GI: Regular diet PPx: held for LP Dispo:Home pending clinical improvement . Barriers include fever, immunocompromised state, further medical work up   Subjective:  Today patient weary of LP, discussed MRI results, importance of checking cryptococcus in CSF given his history. Plan for LP this AM  Objective: Temp:  [98.1 F (36.7 C)-98.5 F (36.9 C)] 98.5 F (36.9 C) (09/11 0400) Pulse Rate:  [60-77] 76 (09/11 0400) Resp:  [14-28] 14 (09/10 1813) BP: (119-156)/(73-97) 156/92 (09/11 0400) SpO2:  [91 %-98 %] 97 % (09/11 0400) Weight:  [77.1 kg] 77.1 kg (09/10 1140) Physical Exam: General: NAD, laying in bed, responsive, alert Cardiovascular: RRR no m/r/g Respiratory: No wheezing, no increased work of breathing, good air movement in all lung fields Abdomen: Soft, non-tender, non-distended Extremities: No lower extremity edema, good tone Neuro: grossly intact  Laboratory: Recent Labs  Lab 10/12/20 1135 10/13/20 0602  WBC 5.1 4.8  HGB 15.3 13.5  HCT 44.7 40.4  PLT 237 314   Recent Labs  Lab 10/12/20 1135 10/13/20 0602  NA 126* 128*  K 3.6 3.8  CL 91* 99  CO2 26 22  BUN 10 11  CREATININE 1.15 1.00  CALCIUM 8.8* 8.1*  PROT  --  7.3  BILITOT  --  1.4*  ALKPHOS  --  106  ALT  --  15  AST  --  47*  GLUCOSE 118* 149*    Imaging/Diagnostic Tests: MR BRAIN W WO CONTRAST  Result Date: 10/13/2020 CLINICAL DATA:  Meningitis/CNS infection suspected EXAM: MRI HEAD WITHOUT AND WITH CONTRAST TECHNIQUE: Multiplanar, multiecho pulse sequences of the brain and surrounding structures were obtained without and with intravenous contrast. CONTRAST:  8m GADAVIST GADOBUTROL 1 MMOL/ML IV SOLN COMPARISON:  MRI April 23, 2020. FINDINGS: Mildly motion limited exam.  Within this limitation: Brain: No change in trace extra-axial fluid anteriorly along the anterior frontal  convexities, likely chronic hematomas (for example series 6, image 16). No acute infarct. No mass lesion, midline shift, or hydrocephalus. No abnormal enhancement. Vascular: Major arterial flow voids are maintained at the skull base. Skull and upper cervical spine: Normal marrow signal. Sinuses/Orbits: Moderate ethmoid air cell mucosal thickening. Mild left frontal sinus mucosal thickening. Air-fluid level in the left maxillary sinus. Other: No mastoid effusions. IMPRESSION: 1. No evidence of acute intracranial abnormality on this motion limited study. 2. Similar trace bilateral anterior frontal convexity subdural hematomas. 3. Paranasal sinus disease with air-fluid level in the left maxillary sinus. Electronically Signed   By: FMargaretha SheffieldM.D.   On: 10/13/2020 19:00     JGerrit Heck MD 10/14/2020, 5:10 AM PGY-1, C[facility]waterIntern pager: [phone] text pages welcome

## 2020-10-15 DIAGNOSIS — U071 COVID-19: Secondary | ICD-10-CM | POA: Diagnosis not present

## 2020-10-15 DIAGNOSIS — J189 Pneumonia, unspecified organism: Secondary | ICD-10-CM | POA: Diagnosis not present

## 2020-10-15 DIAGNOSIS — B451 Cerebral cryptococcosis: Secondary | ICD-10-CM | POA: Diagnosis not present

## 2020-10-15 DIAGNOSIS — B20 Human immunodeficiency virus [HIV] disease: Secondary | ICD-10-CM

## 2020-10-15 DIAGNOSIS — Z9119 Patient's noncompliance with other medical treatment and regimen: Secondary | ICD-10-CM | POA: Diagnosis not present

## 2020-10-15 DIAGNOSIS — F1010 Alcohol abuse, uncomplicated: Secondary | ICD-10-CM | POA: Diagnosis not present

## 2020-10-15 LAB — COMPREHENSIVE METABOLIC PANEL
ALT: 18 U/L (ref 0–44)
AST: 59 U/L — ABNORMAL HIGH (ref 15–41)
Albumin: 2.2 g/dL — ABNORMAL LOW (ref 3.5–5.0)
Alkaline Phosphatase: 104 U/L (ref 38–126)
Anion gap: 7 (ref 5–15)
BUN: 11 mg/dL (ref 8–23)
CO2: 22 mmol/L (ref 22–32)
Calcium: 8.5 mg/dL — ABNORMAL LOW (ref 8.9–10.3)
Chloride: 98 mmol/L (ref 98–111)
Creatinine, Ser: 0.91 mg/dL (ref 0.61–1.24)
GFR, Estimated: >60 mL/min (ref >60)
Glucose, Bld: 110 mg/dL — ABNORMAL HIGH (ref 70–99)
Potassium: 3.7 mmol/L (ref 3.5–5.1)
Sodium: 127 mmol/L — ABNORMAL LOW (ref 135–145)
Total Bilirubin: 1.4 mg/dL — ABNORMAL HIGH (ref 0.3–1.2)
Total Protein: 7.4 g/dL (ref 6.5–8.1)

## 2020-10-15 LAB — CSF CELL COUNT WITH DIFFERENTIAL
RBC Count, CSF: 0 /mm3
Tube #: 1
WBC, CSF: 2 /mm3 (ref 0–5)

## 2020-10-15 LAB — CBC WITH DIFFERENTIAL/PLATELET
Abs Immature Granulocytes: 0.1 10*3/uL — ABNORMAL HIGH (ref 0.00–0.07)
Basophils Absolute: 0 10*3/uL (ref 0.0–0.1)
Basophils Relative: 1 %
Eosinophils Absolute: 0.1 10*3/uL (ref 0.0–0.5)
Eosinophils Relative: 1 %
HCT: 38.6 % — ABNORMAL LOW (ref 39.0–52.0)
Hemoglobin: 13.4 g/dL (ref 13.0–17.0)
Immature Granulocytes: 2 %
Lymphocytes Relative: 14 %
Lymphs Abs: 0.6 10*3/uL — ABNORMAL LOW (ref 0.7–4.0)
MCH: 34.7 pg — ABNORMAL HIGH (ref 26.0–34.0)
MCHC: 34.7 g/dL (ref 30.0–36.0)
MCV: 100 fL (ref 80.0–100.0)
Monocytes Absolute: 0.9 10*3/uL (ref 0.1–1.0)
Monocytes Relative: 20 %
Neutro Abs: 2.8 10*3/uL (ref 1.7–7.7)
Neutrophils Relative %: 62 %
Platelets: 249 10*3/uL (ref 150–400)
RBC: 3.86 MIL/uL — ABNORMAL LOW (ref 4.22–5.81)
RDW: 11.6 % (ref 11.5–15.5)
WBC: 4.4 10*3/uL (ref 4.0–10.5)
nRBC: 0 % (ref 0.0–0.2)

## 2020-10-15 LAB — LEGIONELLA PNEUMOPHILA SEROGP 1 UR AG: L. pneumophila Serogp 1 Ur Ag: NEGATIVE

## 2020-10-15 LAB — CRYPTOCOCCAL ANTIGEN, CSF
Crypto Ag: POSITIVE — AB
Cryptococcal Ag Titer: 20 — AB

## 2020-10-15 LAB — PROTEIN AND GLUCOSE, CSF
Glucose, CSF: 56 mg/dL (ref 40–70)
Total  Protein, CSF: 22 mg/dL (ref 15–45)

## 2020-10-15 LAB — MAGNESIUM: Magnesium: 1.8 mg/dL (ref 1.7–2.4)

## 2020-10-15 LAB — TROPONIN I (HIGH SENSITIVITY)
Troponin I (High Sensitivity): 8 ng/L (ref <18)
Troponin I (High Sensitivity): 11 ng/L (ref <18)
Troponin I (High Sensitivity): 10 ng/L (ref <18)

## 2020-10-15 LAB — T-HELPER CELLS (CD4) COUNT (NOT AT ARMC)
CD4 % Helper T Cell: 4 % — ABNORMAL LOW (ref 33–65)
CD4 T Cell Abs: <35 /uL — ABNORMAL LOW (ref 400–1790)

## 2020-10-15 MED ORDER — IBUPROFEN 200 MG PO TABS
400.0000 mg | ORAL_TABLET | Freq: Four times a day (QID) | ORAL | Status: DC | PRN
  Filled 2020-10-15: qty 2

## 2020-10-15 MED ORDER — DIGOXIN 125 MCG PO TABS: 0.1250 mg | ORAL_TABLET | Freq: Every day | ORAL | Status: DC

## 2020-10-15 MED ORDER — DIGOXIN 125 MCG PO TABS
0.2500 mg | ORAL_TABLET | Freq: Every day | ORAL | Status: DC
  Administered 2020-10-16 – 2020-10-18 (×3): 0.25 mg via ORAL
  Filled 2020-10-15 (×3): qty 2

## 2020-10-15 MED ORDER — ENSURE ENLIVE PO LIQD
237.0000 mL | Freq: Three times a day (TID) | ORAL | Status: DC
  Administered 2020-10-15 – 2020-10-21 (×15): 237 mL via ORAL
  Filled 2020-10-15 (×4): qty 237

## 2020-10-15 MED ORDER — ADULT MULTIVITAMIN W/MINERALS CH
1.0000 | ORAL_TABLET | Freq: Every day | ORAL | Status: DC
  Administered 2020-10-15 – 2020-10-26 (×12): 1 via ORAL
  Filled 2020-10-15 (×12): qty 1

## 2020-10-15 MED ORDER — COSYNTROPIN 0.25 MG IJ SOLR
0.2500 mg | Freq: Once | INTRAMUSCULAR | Status: AC
  Administered 2020-10-16: 0.25 mg via INTRAVENOUS
  Filled 2020-10-15 (×2): qty 0.25

## 2020-10-15 MED ORDER — DIGOXIN 0.25 MG/ML IJ SOLN
0.2500 mg | INTRAMUSCULAR | Status: AC
Start: 2020-10-15 — End: 2020-10-16
  Administered 2020-10-15: 0.25 mg via INTRAVENOUS
  Filled 2020-10-15 (×2): qty 1

## 2020-10-15 MED ORDER — ENSURE ENLIVE PO LIQD
237.0000 mL | Freq: Two times a day (BID) | ORAL | Status: DC
  Administered 2020-10-15: 237 mL via ORAL

## 2020-10-15 MED ORDER — FOLIC ACID 1 MG PO TABS
1.0000 mg | ORAL_TABLET | Freq: Every day | ORAL | Status: DC
  Administered 2020-10-16 – 2020-10-26 (×11): 1 mg via ORAL
  Filled 2020-10-15 (×11): qty 1

## 2020-10-15 NOTE — Progress Notes (Signed)
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 He has stable chronic dry cough without worsening dypsnea  His serum crypto ag is 320. Repeat cd4/hiv vl in process  Patient initially refused LP but now agreeable. I discussed with him need to r/o active culturable cryptococcus involvement of the brain. If that is the case will need to reinduce treatment and defer ART. If no culturable crypto agent, can continue with fluconazole. Would then perhaps also start ART  I have also sent urine histo/blasto. Pending LP  Other oi consideration. Cxr stable persistent cystic/nodular chagnes left upper lobe without new pna. Agree less likely to represent active NTM lung process or new pneumocystis process. Previous MAC likely colonization.   Unclear covid is incidental finding. He has chronic cough unchanged and not on o2 supplement.   PLAN: Await LP for csf fungal cx, cell count/gluc-protein, csf crypto ag, csf vdrl, and csf cx Have sent urine histo/blasto ag, serum rpr I would hold fluconazole for now to maximize csf fungal cx Cotinue atovquone pjp prophy Defer ART at this time Finish remdesivir course for incidental covid finding per primary team  I spent more than 35 minute reviewing data/chart, and coordinating  care and >50% direct face to face time providing counseling/discussing diagnostics/treatment plan with patient   Active Problems:   AIDS (acquired immune deficiency syndrome) ([place])   Mycobacterium avium complex ([place])   COVID-19   COVID-19 virus infection   Community acquired pneumonia   Nonadherence to medical treatment   HIV infection ([place])   Allergies  Allergen Reactions   Bactrim [Sulfamethoxazole-Trimethoprim] Other (See Comments)    Patient was trialed on DS TIW and SS daily for PJP prophylaxis and developed hyperkalemia and increased Scr    Scheduled Meds:  atovaquone  1,500 mg Oral Q breakfast   [START ON 10/16/2020] cosyntropin  0.25 mg Intravenous Once   dextromethorphan-guaiFENesin  1 tablet Oral BID   enoxaparin (LOVENOX) injection  40 mg Subcutaneous Q24H   feeding supplement  237 mL Oral TID BM   fluconazole  800 mg Oral Daily   fluticasone  1 spray Each Nare Daily   [START ON 2/54/2706] folic acid  1 mg Oral Daily   multivitamin with minerals  1 tablet Oral Daily   Continuous Infusions:  remdesivir 100 mg in NS 100 mL Stopped (10/15/20 1057)   PRN Meds:.acetaminophen, ibuprofen   SUBJECTIVE: Agree to LP after I explain to him need No n/v/headache Didn't complain about blurriness today No f/c No n/v/diarrhea No rash No joint pain  Said he stopped all medication about 3 months prior to this admission  Review of Systems: ROS All other ROS was negative, except mentioned above     OBJECTIVE: Vitals:   10/14/20 2246 10/15/20 0000 10/15/20 0309 10/15/20 0336  BP: (!) 138/97  Marland Kitchen)  145/105 (!) 139/99  Pulse: 66  68 70  Resp:  _0 Temp: 99.8 F (37.7 C)  98.2 F (36.8 C)   TempSrc: Oral  Oral   SpO2: 98%  98% 98%  Weight:      Height:       Body mass index is 22.43 kg/m.  Physical Exam  General/constitutional: no distress, pleasant; dry cough intermittently HEENT: Normocephalic, PER, Conj Clear, EOMI, Oropharynx clear Neck supple CV:  rrr no mrg Lungs: clear to auscultation, normal respiratory effort Abd: Soft, Nontender Ext: no edema Skin: No Rash Neuro: nonfocal MSK: no peripheral joint swelling/tenderness/warmth; back spines nontender   Lab Results Lab Results  Component Value Date   WBC 4.4 10/15/2020   HGB 13.4 10/15/2020   HCT 38.6 (L) 10/15/2020   MCV 100.0 10/15/2020   PLT 249 10/15/2020    Lab Results  Component Value Date   CREATININE 0.91 10/15/2020   BUN 11 10/15/2020   NA 127 (L) 10/15/2020   K 3.7 10/15/2020   CL 98 10/15/2020   CO2 22 10/15/2020    Lab Results  Component Value Date   ALT 18 10/15/2020   AST 59 (H) 10/15/2020   ALKPHOS 104 10/15/2020   BILITOT 1.4 (H) 10/15/2020      Microbiology: Recent Results (from the past 240 hour(s))  Resp Panel by RT-PCR (Flu A&B, Covid) Nasopharyngeal Swab     Status: Abnormal   Collection Time: 10/12/20 11:34 AM   Specimen: Nasopharyngeal Swab; Nasopharyngeal(NP) swabs in vial transport medium  Result Value Ref Range Status   SARS Coronavirus 2 by RT PCR POSITIVE (A) NEGATIVE Final    Comment: RESULT CALLED TO, READ BACK BY AND VERIFIED WITH: Edwyna Shell RN [id number] 10/12/20 A BROWNING (NOTE) SARS-CoV-2 target nucleic acids are DETECTED.  The SARS-CoV-2 RNA is generally detectable in upper respiratory specimens during the acute phase of infection. Positive results are indicative of the presence of the identified virus, but do not rule out bacterial infection or co-infection with other pathogens not detected by the test. Clinical correlation with patient history and other diagnostic information is necessary to determine patient infection status. The expected result is Negative.  Fact Sheet for Patients: EntrepreneurPulse.com.au  Fact Sheet for Healthcare Providers: IncredibleEmployment.be  This test is not yet approved or cleared by the [place] FDA and  has been authorized for detection and/or  diagnosis of SARS-CoV-2 by FDA under an Emergency Use Authorization (EUA).  This EUA will remain in effect (meaning this test ca n be used) for the duration of  the COVID-19 declaration under Section 564(b)(1) of the Act, 21 U.S.C. section 360bbb-3(b)(1), unless the authorization is terminated or revoked sooner.     Influenza A by PCR NEGATIVE NEGATIVE Final   Influenza B by PCR NEGATIVE NEGATIVE Final    Comment: (NOTE) The Xpert Xpress SARS-CoV-2/FLU/RSV plus assay is intended as an aid in the diagnosis of influenza from Nasopharyngeal swab specimens and should not be used as a sole basis for treatment. Nasal washings and aspirates are unacceptable for Xpert Xpress SARS-CoV-2/FLU/RSV testing.  Fact Sheet for Patients: EntrepreneurPulse.com.au  Fact Sheet for Healthcare Providers: IncredibleEmployment.be  This test is not yet approved or cleared by the [place] FDA and has been authorized for detection and/or diagnosis of SARS-CoV-2 by FDA under an Emergency Use Authorization (EUA). This EUA will remain in effect (meaning this test can be used) for the duration of the COVID-19 declaration under Section 564(b)(1) of the Act,  21 U.S.C. section 360bbb-3(b)(1), unless the authorization is terminated or revoked.  Performed at [facility], [place] [address] [place], [place] [address]   Blood culture (routine x 2)     Status: None (Preliminary result)   Collection Time: 10/12/20  2:45 PM   Specimen: BLOOD LEFT FOREARM  Result Value Ref Range Status   Specimen Description BLOOD LEFT FOREARM  Final   Special Requests   Final    BOTTLES DRAWN AEROBIC AND ANAEROBIC Blood Culture adequate volume   Culture   Final    NO GROWTH 3 DAYS Performed at [facility], [place] [address] [place], [place] [address]    Report Status PENDING  Incomplete  Blood culture (routine x 2)     Status: None (Preliminary result)   Collection Time: 10/12/20   5:10 PM   Specimen: BLOOD RIGHT WRIST  Result Value Ref Range Status   Specimen Description BLOOD RIGHT WRIST  Final   Special Requests   Final    BOTTLES DRAWN AEROBIC AND ANAEROBIC Blood Culture adequate volume   Culture   Final    NO GROWTH 3 DAYS Performed at [facility], [place] [address] [place], [place] [address]    Report Status PENDING  Incomplete     Serology:   Imaging: If present, new imagings (plain films, ct scans, and mri) have been personally visualized and interpreted; radiology reports have been reviewed. Decision making incorporated into the Impression / Recommendations  9/10 mri brain 1. No evidence of acute intracranial abnormality on this motion limited study. 2. Similar trace bilateral anterior frontal convexity subdural hematomas. 3. Paranasal sinus disease with air-fluid level in the left maxillary sinus.   9/09 cxr 1. Persistent cystic and nodular opacities within the left upper lobe. As described on previous CT, these findings are favored to represent sequelae of atypical infection. 2. No acute superimposed airspace consolidation.  Jabier Mutton, [facility] [place] [phone] pager    10/15/2020, 3:40 PM

## 2020-10-15 NOTE — Progress Notes (Signed)
MD notified of patient having episode of elevated heart rate in 190s.  New orders added to start Digoxin at 1900.

## 2020-10-15 NOTE — Progress Notes (Signed)
Subjective: Sitting up at bedside says he is 100% better. He says it was a sinus headache which is better since getting "Mucinex" and Flonase.    Objective: Current vital signs: BP (!) 139/99 (BP Location: Left Arm)   Pulse 70   Temp 98.2 F (36.8 C) (Oral)   Resp 18   Ht '6\' 1"'$  (1.854 m)   Wt 77.1 kg   SpO2 98%   BMI 22.43 kg/m  Vital signs in last 24 hours: Temp:  [98.2 F (36.8 C)-99.8 F (37.7 C)] 98.2 F (36.8 C) (09/12 0309) Pulse Rate:  [66-75] 70 (09/12 0336) Resp:  [17-18] 18 (09/12 0336) BP: (127-145)/(84-105) 139/99 (09/12 0336) SpO2:  [97 %-99 %] 98 % (09/12 0336)  Intake/Output from previous day: No intake/output data recorded. Intake/Output this shift: No intake/output data recorded. Nutritional status:  Diet Order             Diet regular Room service appropriate? No; Fluid consistency: Thin  Diet effective now                  Gen: Nondiaphoretic, NAD, respirations unlabored HEENT: [place]/AT Ext: No edema  Neurologic Exam: Ment: Awake and alert. Speech fluent with intact comprehension. Easily agitated.  CN: Pupils equal. EOMI. Fixates and tracks normally. Face symmetric.  Motor: No motor deficit appreciated. Cerebellar: No ataxia noted.   Lab Results: Results for orders placed or performed during the hospital encounter of 10/12/20 (from the past 48 hour(s))  Cryptococcal antigen     Status: Abnormal   Collection Time: 10/13/20  6:43 PM  Result Value Ref Range   Crypto Ag POSITIVE (A) NEGATIVE    Comment: RESULT CALLED TO, READ BACK BY AND VERIFIED WITH: RN MELAINE ALEXANDER 10/13/20'@22'$ :11 BY TW    Cryptococcal Ag Titer 320 (A) NOT INDICATED    Comment: Performed at [facility], [address], [place], [place] [address]  CBC with Differential/Platelet     Status: Abnormal   Collection Time: 10/14/20  5:22 AM  Result Value Ref Range   WBC 3.7 (L) 4.0 - 10.5 K/uL   RBC 3.89 (L) 4.22 - 5.81 MIL/uL   Hemoglobin 13.3 13.0 - 17.0 g/dL    HCT 38.5 (L) 39.0 - 52.0 %   MCV 99.0 80.0 - 100.0 fL   MCH 34.2 (H) 26.0 - 34.0 pg   MCHC 34.5 30.0 - 36.0 g/dL   RDW 11.8 11.5 - 15.5 %   Platelets 236 150 - 400 K/uL   nRBC 0.0 0.0 - 0.2 %   Neutrophils Relative % 60 %   Neutro Abs 2.3 1.7 - 7.7 K/uL   Lymphocytes Relative 11 %   Lymphs Abs 0.4 (L) 0.7 - 4.0 K/uL   Monocytes Relative 21 %   Monocytes Absolute 0.8 0.1 - 1.0 K/uL   Eosinophils Relative 2 %   Eosinophils Absolute 0.1 0.0 - 0.5 K/uL   Basophils Relative 1 %   Basophils Absolute 0.0 0.0 - 0.1 K/uL   Immature Granulocytes 5 %   Abs Immature Granulocytes 0.17 (H) 0.00 - 0.07 K/uL    Comment: Performed at [facility] [address], [place], [place] [address]  Comprehensive metabolic panel     Status: Abnormal   Collection Time: 10/14/20  5:22 AM  Result Value Ref Range   Sodium 128 (L) 135 - 145 mmol/L   Potassium 3.6 3.5 - 5.1 mmol/L   Chloride 99 98 - 111 mmol/L   CO2 21 (  L) 22 - 32 mmol/L   Glucose, Bld 139 (H) 70 - 99 mg/dL    Comment: Glucose reference range applies only to samples taken after fasting for at least 8 hours.   BUN 14 8 - 23 mg/dL   Creatinine, Ser 0.95 0.61 - 1.24 mg/dL   Calcium 8.3 (L) 8.9 - 10.3 mg/dL   Total Protein 7.1 6.5 - 8.1 g/dL   Albumin 2.1 (L) 3.5 - 5.0 g/dL   AST 50 (H) 15 - 41 U/L   ALT 17 0 - 44 U/L   Alkaline Phosphatase 99 38 - 126 U/L   Total Bilirubin 1.3 (H) 0.3 - 1.2 mg/dL   GFR, Estimated >60 >60 mL/min    Comment: (NOTE) Calculated using the CKD-EPI Creatinine Equation (2021)    Anion gap 8 5 - 15    Comment: Performed at [facility], [place] [address], [place], [place] [address]  Folate, serum, performed at [facility] lab     Status: None   Collection Time: 10/14/20  5:22 AM  Result Value Ref Range   Folate 14.9 >5.9 ng/mL    Comment: Performed at [facility], [place] [address] [place], [place] [address]  Vitamin B12     Status: None   Collection Time: 10/14/20  5:22 AM  Result Value Ref  Range   Vitamin B-12 540 180 - 914 pg/mL    Comment: (NOTE) This assay is not validated for testing neonatal or myeloproliferative syndrome specimens for Vitamin B12 levels. Performed at [facility], [place] [address] [place], [place] [address]   CBC with Differential/Platelet     Status: Abnormal   Collection Time: 10/15/20  2:20 AM  Result Value Ref Range   WBC 4.4 4.0 - 10.5 K/uL   RBC 3.86 (L) 4.22 - 5.81 MIL/uL   Hemoglobin 13.4 13.0 - 17.0 g/dL   HCT 38.6 (L) 39.0 - 52.0 %   MCV 100.0 80.0 - 100.0 fL   MCH 34.7 (H) 26.0 - 34.0 pg   MCHC 34.7 30.0 - 36.0 g/dL   RDW 11.6 11.5 - 15.5 %   Platelets 249 150 - 400 K/uL   nRBC 0.0 0.0 - 0.2 %   Neutrophils Relative % 62 %   Neutro Abs 2.8 1.7 - 7.7 K/uL   Lymphocytes Relative 14 %   Lymphs Abs 0.6 (L) 0.7 - 4.0 K/uL   Monocytes Relative 20 %   Monocytes Absolute 0.9 0.1 - 1.0 K/uL   Eosinophils Relative 1 %   Eosinophils Absolute 0.1 0.0 - 0.5 K/uL   Basophils Relative 1 %   Basophils Absolute 0.0 0.0 - 0.1 K/uL   Immature Granulocytes 2 %   Abs Immature Granulocytes 0.10 (H) 0.00 - 0.07 K/uL    Comment: Performed at [facility] [address], [place], [place] [address]  Comprehensive metabolic panel     Status: Abnormal   Collection Time: 10/15/20  2:20 AM  Result Value Ref Range   Sodium 127 (L) 135 - 145 mmol/L   Potassium 3.7 3.5 - 5.1 mmol/L   Chloride 98 98 - 111 mmol/L   CO2 22 22 - 32 mmol/L   Glucose, Bld 110 (H) 70 - 99 mg/dL    Comment: Glucose reference range applies only to samples taken after fasting for at least 8 hours.   BUN 11 8 - 23 mg/dL   Creatinine, Ser 0.91 0.61 - 1.24 mg/dL   Calcium 8.5 (L)  8.9 - 10.3 mg/dL   Total Protein 7.4 6.5 - 8.1 g/dL   Albumin 2.2 (L) 3.5 - 5.0 g/dL   AST 59 (H) 15 - 41 U/L   ALT 18 0 - 44 U/L   Alkaline Phosphatase 104 38 - 126 U/L   Total Bilirubin 1.4 (H) 0.3 - 1.2 mg/dL   GFR, Estimated >60 >60 mL/min    Comment: (NOTE) Calculated using the  CKD-EPI Creatinine Equation (2021)    Anion gap 7 5 - 15    Comment: Performed at [facility] [address] [place], [place] [address]  Troponin I (High Sensitivity)     Status: None   Collection Time: 10/15/20  2:20 AM  Result Value Ref Range   Troponin I (High Sensitivity) 8 <18 ng/L    Comment: (NOTE) Elevated high sensitivity troponin I (hsTnI) values and significant  changes across serial measurements may suggest ACS but many other  chronic and acute conditions are known to elevate hsTnI results.  Refer to the "Links" section for chest pain algorithms and additional  guidance. Performed at [facility], [place] [address], [place], [place] [address]   Magnesium     Status: None   Collection Time: 10/15/20  2:20 AM  Result Value Ref Range   Magnesium 1.8 1.7 - 2.4 mg/dL    Comment: Performed at [facility], [place] [address], [place], [place] [address]  Troponin I (High Sensitivity)     Status: None   Collection Time: 10/15/20  7:19 AM  Result Value Ref Range   Troponin I (High Sensitivity) 11 <18 ng/L    Comment: (NOTE) Elevated high sensitivity troponin I (hsTnI) values and significant  changes across serial measurements may suggest ACS but many other  chronic and acute conditions are known to elevate hsTnI results.  Refer to the "Links" section for chest pain algorithms and additional  guidance. Performed at [facility], [place] [address], [place], [place] [address]     Recent Results (from the past 240 hour(s))  Resp Panel by RT-PCR (Flu A&B, Covid) Nasopharyngeal Swab     Status: Abnormal   Collection Time: 10/12/20 11:34 AM   Specimen: Nasopharyngeal Swab; Nasopharyngeal(NP) swabs in vial transport medium  Result Value Ref Range Status   SARS Coronavirus 2 by RT PCR POSITIVE (A) NEGATIVE Final    Comment: RESULT CALLED TO, READ BACK BY AND VERIFIED WITH: Edwyna Shell RN [id number] 10/12/20 A BROWNING (NOTE) SARS-CoV-2 target nucleic acids are  DETECTED.  The SARS-CoV-2 RNA is generally detectable in upper respiratory specimens during the acute phase of infection. Positive results are indicative of the presence of the identified virus, but do not rule out bacterial infection or co-infection with other pathogens not detected by the test. Clinical correlation with patient history and other diagnostic information is necessary to determine patient infection status. The expected result is Negative.  Fact Sheet for Patients: EntrepreneurPulse.com.au  Fact Sheet for Healthcare Providers: IncredibleEmployment.be  This test is not yet approved or cleared by the [place] FDA and  has been authorized for detection and/or diagnosis of SARS-CoV-2 by FDA under an Emergency Use Authorization (EUA).  This EUA will remain in effect (meaning this test ca n be used) for the duration of  the COVID-19 declaration under Section 564(b)(1) of the Act, 21 U.S.C. section 360bbb-3(b)(1), unless the authorization is terminated or revoked sooner.     Influenza A by PCR NEGATIVE NEGATIVE Final   Influenza  B by PCR NEGATIVE NEGATIVE Final    Comment: (NOTE) The Xpert Xpress SARS-CoV-2/FLU/RSV plus assay is intended as an aid in the diagnosis of influenza from Nasopharyngeal swab specimens and should not be used as a sole basis for treatment. Nasal washings and aspirates are unacceptable for Xpert Xpress SARS-CoV-2/FLU/RSV testing.  Fact Sheet for Patients: EntrepreneurPulse.com.au  Fact Sheet for Healthcare Providers: IncredibleEmployment.be  This test is not yet approved or cleared by the [place] FDA and has been authorized for detection and/or diagnosis of SARS-CoV-2 by FDA under an Emergency Use Authorization (EUA). This EUA will remain in effect (meaning this test can be used) for the duration of the COVID-19 declaration under Section 564(b)(1) of the Act, 21  U.S.C. section 360bbb-3(b)(1), unless the authorization is terminated or revoked.  Performed at [facility], [place] [address], [place], [place] [address]   Blood culture (routine x 2)     Status: None (Preliminary result)   Collection Time: 10/12/20  2:45 PM   Specimen: BLOOD LEFT FOREARM  Result Value Ref Range Status   Specimen Description BLOOD LEFT FOREARM  Final   Special Requests   Final    BOTTLES DRAWN AEROBIC AND ANAEROBIC Blood Culture adequate volume   Culture   Final    NO GROWTH 2 DAYS Performed at [facility], [place] [address] [place], [place] [address]    Report Status PENDING  Incomplete  Blood culture (routine x 2)     Status: None (Preliminary result)   Collection Time: 10/12/20  5:10 PM   Specimen: BLOOD RIGHT WRIST  Result Value Ref Range Status   Specimen Description BLOOD RIGHT WRIST  Final   Special Requests   Final    BOTTLES DRAWN AEROBIC AND ANAEROBIC Blood Culture adequate volume   Culture   Final    NO GROWTH 2 DAYS Performed at [facility], [place] [address] [place], [place] [address]    Report Status PENDING  Incomplete    Lipid Panel No results for input(s): CHOL, TRIG, HDL, CHOLHDL, VLDL, LDLCALC in the last 72 hours.  Studies/Results: MR BRAIN W WO CONTRAST  Result Date: 10/13/2020 CLINICAL DATA:  Meningitis/CNS infection suspected EXAM: MRI HEAD WITHOUT AND WITH CONTRAST TECHNIQUE: Multiplanar, multiecho pulse sequences of the brain and surrounding structures were obtained without and with intravenous contrast. CONTRAST:  28m GADAVIST GADOBUTROL 1 MMOL/ML IV SOLN COMPARISON:  MRI April 23, 2020. FINDINGS: Mildly motion limited exam.  Within this limitation: Brain: No change in trace extra-axial fluid anteriorly along the anterior frontal convexities, likely chronic hematomas (for example series 6, image 16). No acute infarct. No mass lesion, midline shift, or hydrocephalus. No abnormal enhancement. Vascular: Major arterial flow  voids are maintained at the skull base. Skull and upper cervical spine: Normal marrow signal. Sinuses/Orbits: Moderate ethmoid air cell mucosal thickening. Mild left frontal sinus mucosal thickening. Air-fluid level in the left maxillary sinus. Other: No mastoid effusions. IMPRESSION: 1. No evidence of acute intracranial abnormality on this motion limited study. 2. Similar trace bilateral anterior frontal convexity subdural hematomas. 3. Paranasal sinus disease with air-fluid level in the left maxillary sinus. Electronically Signed   By: FMargaretha SheffieldM.D.   On: 10/13/2020 19:00    Medications: Scheduled:  atovaquone  1,500 mg Oral Q breakfast   dextromethorphan-guaiFENesin  1 tablet Oral BID   enoxaparin (LOVENOX) injection  40 mg Subcutaneous Q24H   fluconazole  800 mg Oral Daily   fluticasone  1 spray  Each Nare Daily   folic acid  1 mg Intravenous Daily   Continuous:  remdesivir 100 mg in NS 100 mL 100 mg (10/15/20 0920)     Assessment: 65 y.o. 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 MRI showed sinus disease with air-fluid level in the left maxillary sinus. Examination remains unchanged without focal deficits. Speech remains intact.   - MRI brain with and without contrast reveals no evidence of acute intracranial abnormality. Similar trace bilateral anterior frontal convexity subdural hematomas are noted in comparison with the prior study. Also seen is paranasal sinus disease with air-fluid level in the left maxillary sinus.  - COVID-19 (+), hyponatremia with a sodium of 126, UDS (+) cocaine/THC, and pending  blood cultures. He has tested positive for cryptococcal antigen (320).  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  Today, his headache is down to a 1/10 after he received sinus medication. He states that he now thinks that it is a sinus headache. He also states that the minor headache pain, which is centered around his sinuses does not resemble in any way the severe disabling 10/10 headache he had prior to treatment of the cryptococcal meningitis that was diagnosed earlier this year; he states that at that time "my head felt like it was going to explode" whereas currently he is experiencing minor sinus discomfort.    Recommendations: - Risks/benefits of LP were discussed with the patient, for further evaluation of opening pressure with CSF studies for cryptococcal meningitis. He states he is better and does not think he needs an LP. The patient is determined to be able to integrate the information required to make his own medical decisions.   Clinically, he does not look like cryptococcal meningits and this may be related to left sinus disease or recurrent/indolent infection based on lab draw above. The patient states his headache is now resolved with guaifenesin and Flonase and he thinks that it is a sinus headache.   - ID started fluconazole. If blood cultures grow cryptococcus (he would require ~3 days), the ID consultant stated that he will need IV amphotericin.    LOS: 2 days   Lynnae Sandhoff, MD Stroke Neurology Page: [phone]  10/15/2020  9:31 AM

## 2020-10-15 NOTE — Procedures (Signed)
LUMBAR PUNCTURE (SPINAL TAP) PROCEDURE NOTE  Indication: meningitis   Proceduralists: Clance Boll, NP H. Theda Sers, MD proctor   Risks of the procedure were dicussed with the patient including post-LP headache, bleeding, infection, weakness/numbness of legs(radiculopathy), death.    Consent obtained from: patient  Procedure Note The patient was prepped and draped, and using sterile technique a 20 gauge quinke spinal needle was inserted in the L4-5 space.   Opening pressure was not obtained   Approximately 20.5 cc of CSF were obtained and sent for analysis.  Patient tolerated the procedure well and blood loss was minimal.   Asked patient to lie flat for 1-2 hours.   KJKG, NP

## 2020-10-15 NOTE — Progress Notes (Signed)
MD at the bedside assessing pt

## 2020-10-15 NOTE — Progress Notes (Signed)
I spoke with Dr. Einar Gip, [facility], who recommended using digoxin overnight to help prevent SVT in setting of patient who uses cocaine. Spoke with pharmacy, will use 0.'25mg'$  IV x2 doses and then switch to 0.'25mg'$  PO tomorrow. Dr. Einar Gip will round on patient tomorrow.  Gladys Damme, MD [place] [facility], PGY-3

## 2020-10-15 NOTE — Progress Notes (Signed)
FPTS Interim Progress Note  S: called to assess patient for sinus tach to 160's lasting less than 1 minute. Patient reports experiencing temporary chest pain and palpitations. On interview later he denies current CP, SoB, and other symptoms.  EKG performed just prior to interview shows NSR at 72 BPM, no ST elevations.  O: BP (!) 139/99 (BP Location: Left Arm)   Pulse 70   Temp 98.2 F (36.8 C) (Oral)   Resp 18   Ht '6\' 1"'$  (1.854 m)   Wt 77.1 kg   SpO2 98%   BMI 22.43 kg/m   General: ill appearing male lying in bed CV: RRR, NMRG  A/P: c/f SVT given patient's Hx of this condition with unsuccessful ablation. Patient has not been compliant with metop as an OP. Can consider restarting if this problem persists.  -CTM with cardiac monitoring  -Troponins -Check Mg, K from this AM normal at 3.7    Corky Sox PGY-1, Psychiatry Service pager [phone]

## 2020-10-15 NOTE — Progress Notes (Addendum)
Family Medicine Teaching Service Daily Progress Note Intern Pager: [phone]  Patient name: Ricky [name] Medical record number: [id number] Date of birth: 23-Nov-1955 Age: 65 y.o. Gender: male  Primary Care Provider: Pcp, No Consultants: ID, Neurology Code Status: Full Code  Pt Overview and Major Events to Date:  9/20 admitted 9/10 cefepime DC'd showed no acute abnormalities, trace subdural hematomas, left maxillary sinus air-fluid level 9/11 refused LP, started on fluconazole and atovaquone per ID  Assessment and Plan:  Patient is a 65 year old man presenting with cough, fever and chest pain.  Past medical history significant for cryptococcal meningitis HIV/AIDS, MAC, SVT s/p unsuccessful ablation, CKD, HTN, dyslipidemia, routine calorie malnutrition, polysubstance abuse  Headache, COVID-19, HIV/AIDS with immunocompromised state Headache this morning similar to yesterday, not bothering him, says it improves with mucinex/flonase. Complained of back pain and epigastric pain when coughing that tylenol does not help and he wants something stronger for this. T-max of 100.1 on admission 9/9 has been afebrile since, similar cough as yesterday. Refused LP yesterday, was started on fluconazole per ID.  -ID consulted, appreciate assistance; continue with remdesivir course, not treating Mycobacterium in the lungs currently because of patient compliance, started on fluconazole 800 mg daily, and atovaquone 1500 mg daily for PCP prophylaxis. -Neurology, not likely cryptococcal meningitis signs, likely  more related to sinus infection - Mucinex every 12 hours, Flonase -As needed Tylenol 1000 mg every 6 hours -added 400 mg advil q6h prn -Continuous cardiac and pulse oximetry monitoring -Daily CBC, CMP -Airborne and contact precautions -Remdesivir for 4 days (day 3), no steroids given due to immunocompromise state. ID to decide mAB -Blood culture and Legionella pending -Incentive spirometry flutter  valve -Follow-up CT in 3 months nodular opacities in left lung  SVT s/p unsuccessful ablation SVT in March and underwent ablation at this time which was unsuccessful.  Has not been continuing his metoprolol outpatient and regularly uses cocaine.  Yesterday patient had sinus tachycardia to 160s lasting less than 1 minute with temporary chest pain and palpitations, later denied chest pain, shortness of breath and other symptoms.  EKG showed normal sinus rhythm and no ST elevations. This morning SVT on telemetry for 5 minutes, aptient had chest tightness during this time and felt lightheaded and just sat down. When I was in the room, pulse returned normal and had no chest pain and was breathing comfortably. Troponins (8-->11). Awaiting troponins and EKG -Trend troponins -Cardiac monitoring -Potassium was normal at 3.7 this morning, magnesium 1.8. 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  He had previously had headaches which improved with LP.  Patient refused LP yesterday. 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  Discharged with azithromycin, rifabutin, and ethambutol but did not complete this course.  Chest x-ray and CTA showed left upper lobe infiltration. -ID consulted and following, infection versus colonization, not treating MAC due to the patient's medication adherence likely had and less importance of medically  Chronic hyponatremia Sodium on admission was 126, today was 127. Consider AM cortisol, ACTH TSH to r/o hypothyroidism or SIADH. -A.M. CMP, monitor  Elevated LFT AST/ALT was 59/18 today from 50/17 yesterday. AST>ALT, likely related to alcohol use. Patient had no abdominal pain on exam today except epigastric pain when coughing. -monitor AM CMPs  Polysubstance abuse  CIWA scores have been 1 and most recently 4 (close to SVT run overnight). -TOC consult for substance abuse resources -CIWA  Protocol -Folic acid injection  Moderate  protein calorie malnutrition Patient has chronic calcific pancreatitis and chronic malnutrition. -Consult RT  Hypertension  Patient has history of hypertension but currently not on any medications.  Blood pressures have been 140/90s -Continue monitoring  Macrocytosis without anemia, resolved MCV 100 today, hemoglobin 13.4.  FEN/GI: Regular diet PPx: Lovenox Dispo:Home pending clinical improvement . Barriers include immunocompromised state, requires further medical work-up.   Subjective:  Patient complaining of back pain and epigastric pain when coughing and feels tylenol is not helping. No chest pain, only during SVT he feels his heart going fast and has to sit down.  Objective: Temp:  [98.2 F (36.8 C)-99.8 F (37.7 C)] 98.2 F (36.8 C) (09/12 0309) Pulse Rate:  [66-75] 70 (09/12 0336) Resp:  [17-18] 18 (09/12 0336) BP: (127-145)/(84-105) 139/99 (09/12 0336) SpO2:  [97 %-99 %] 98 % (09/12 0336) Physical Exam: General: NAD, sitting on bed Cardiovascular: RRR, no m/r/g Respiratory: no iWOB, CTAB Abdomen: [name], non distended, epigastric pain when coughing Extremities: no peripheral edema, gait normal  Laboratory: Recent Labs  Lab 10/13/20 0602 10/14/20 0522 10/15/20 0220  WBC 4.8 3.7* 4.4  HGB 13.5 13.3 13.4  HCT 40.4 38.5* 38.6*  PLT 314 236 249   Recent Labs  Lab 10/13/20 0602 10/14/20 0522 10/15/20 0220  NA 128* 128* 127*  K 3.8 3.6 3.7  CL 99 99 98  CO2 22 21* 22  BUN _0 CREATININE 1.00 0.95 0.91  CALCIUM 8.1* 8.3* 8.5*  PROT 7.3 7.1 7.4  BILITOT 1.4* 1.3* 1.4*  ALKPHOS 106 99 104  ALT _1 AST 47* 50* 59*  GLUCOSE 149* 139* 110*    Imaging/Diagnostic Tests: No results found.   Gerrit Heck, MD 10/15/2020, 6:16 AM PGY-1, [place] Intern pager: [phone], text pages welcome

## 2020-10-15 NOTE — Progress Notes (Signed)
Initial Nutrition Assessment  DOCUMENTATION CODES:   Not applicable  INTERVENTION:  -Ensure Enlive po TID, each supplement provides 350 kcal and 20 grams of protein -continue MVI with minerals daily  NUTRITION DIAGNOSIS:   Increased nutrient needs related to catabolic illness as evidenced by estimated needs  GOAL:   Patient will meet greater than or equal to 90% of their needs  MONITOR:   PO intake, Supplement acceptance, Weight trends, Labs, I & O's  REASON FOR ASSESSMENT:   Consult Assessment of nutrition requirement/status  ASSESSMENT:   Pt with PMH significant for cryptococcal meningitis, HIV/AIDS, MAC, SVT s/p unsuccessful ablation, CKD, HTN, dyslipidemia, and polysubstance abuse admitted with COVID-19 and HIV/AIDS w/ immunocompromised state.  9/11 pt refused LP  Pt unavailable at time of RD visit x 2 attempts. Limited weight history to review but based on available weight readings, pt's weight has been stable over 6 months. No PO intake documented. Pt with orders for Ensure BID and is doing well with supplement per RN.   No UOP documented x24 hours  Medications: folvite, mvi with minerals Labs: Na 127 (L)  Diet Order:   Diet Order             Diet regular Room service appropriate? No; Fluid consistency: Thin  Diet effective now                   EDUCATION NEEDS:   No education needs have been identified at this time  Skin:  Skin Assessment: Reviewed RN Assessment  Last BM:  9/10  Height:   Ht Readings from Last 1 Encounters:  10/13/20 _0  (1.854 m)    Weight:   Wt Readings from Last 1 Encounters:  10/13/20 77.1 kg     BMI:  Body mass index is 22.43 kg/m.  Estimated Nutritional Needs:   Kcal:  2100-2300  Protein:  105-115 grams  Fluid:  >2.1L    Larkin Ina, MS, RD, LDN (she/her/hers) RD pager number and weekend/on-call pager number located in [place].

## 2020-10-15 NOTE — Progress Notes (Signed)
FPTS Interim Progress Note  S: called to assess patient for cough. Patient found resting comfortably in bed, asleep. Did not awaken patient.  O: BP (!) 138/97 (BP Location: Left Arm)   Pulse 66   Temp 99.8 F (37.7 C) (Oral)   Resp 18   Ht '6\' 1"'$  (1.854 m)   Wt 77.1 kg   SpO2 98%   BMI 22.43 kg/m   General: patient sleeping comfortably with no respiratory distress on RA.   A/P: VSS, remainder of plan per day team.  Corky Sox PGY-1, Psychiatry Service pager [phone]

## 2020-10-15 NOTE — Progress Notes (Signed)
MD paged about minute or so long episode of patient having heart rate 190s.  After episode patient heart rate went to NSR 70s. Orders received for EKG and trops to be drawn.

## 2020-10-15 NOTE — Progress Notes (Signed)
FPTS Interim Progress Note  S: No complaints tonight.  O: BP 130/90 (BP Location: Left Arm)   Pulse 88   Temp 100 F (37.8 C) (Oral)   Resp 20   Ht '6\' 1"'$  (1.854 m)   Wt 77.1 kg   SpO2 95%   BMI 22.43 kg/m    General: Alert and oriented in no apparent distress Heart: RRR, no murmurs Lungs: CTA bilaterally, no wheezing Extremities: No lower extremity edema  A/P: Patient with no complaints or concerns tonight.  Cardiology has started digoxin due to his SVT/tachycardia today.  He did get his lumbar puncture and we will follow-up on the results from this.  We also can check an ACTH in the morning.  Remainder per daytime progress note.  Lurline Del, DO 10/15/2020, 8:57 PM PGY-3, [place] Medicine Service pager [phone]

## 2020-10-16 ENCOUNTER — Other Ambulatory Visit (HOSPITAL_COMMUNITY): Payer: Self-pay

## 2020-10-16 DIAGNOSIS — I471 Supraventricular tachycardia: Secondary | ICD-10-CM

## 2020-10-16 DIAGNOSIS — A310 Pulmonary mycobacterial infection: Secondary | ICD-10-CM | POA: Diagnosis not present

## 2020-10-16 DIAGNOSIS — B451 Cerebral cryptococcosis: Secondary | ICD-10-CM | POA: Diagnosis not present

## 2020-10-16 DIAGNOSIS — B20 Human immunodeficiency virus [HIV] disease: Secondary | ICD-10-CM | POA: Diagnosis not present

## 2020-10-16 DIAGNOSIS — U071 COVID-19: Secondary | ICD-10-CM | POA: Diagnosis not present

## 2020-10-16 DIAGNOSIS — F1010 Alcohol abuse, uncomplicated: Secondary | ICD-10-CM | POA: Diagnosis not present

## 2020-10-16 LAB — COMPREHENSIVE METABOLIC PANEL
ALT: 25 U/L (ref 0–44)
AST: 81 U/L — ABNORMAL HIGH (ref 15–41)
Albumin: 2.4 g/dL — ABNORMAL LOW (ref 3.5–5.0)
Alkaline Phosphatase: 107 U/L (ref 38–126)
Anion gap: 9 (ref 5–15)
BUN: 11 mg/dL (ref 8–23)
CO2: 24 mmol/L (ref 22–32)
Calcium: 8.8 mg/dL — ABNORMAL LOW (ref 8.9–10.3)
Chloride: 94 mmol/L — ABNORMAL LOW (ref 98–111)
Creatinine, Ser: 0.94 mg/dL (ref 0.61–1.24)
GFR, Estimated: >60 mL/min (ref >60)
Glucose, Bld: 116 mg/dL — ABNORMAL HIGH (ref 70–99)
Potassium: 3.9 mmol/L (ref 3.5–5.1)
Sodium: 127 mmol/L — ABNORMAL LOW (ref 135–145)
Total Bilirubin: 1.7 mg/dL — ABNORMAL HIGH (ref 0.3–1.2)
Total Protein: 8.2 g/dL — ABNORMAL HIGH (ref 6.5–8.1)

## 2020-10-16 LAB — CBC WITH DIFFERENTIAL/PLATELET
Abs Immature Granulocytes: 0.34 10*3/uL — ABNORMAL HIGH (ref 0.00–0.07)
Basophils Absolute: 0 10*3/uL (ref 0.0–0.1)
Basophils Relative: 0 %
Eosinophils Absolute: 0 10*3/uL (ref 0.0–0.5)
Eosinophils Relative: 0 %
HCT: 41.7 % (ref 39.0–52.0)
Hemoglobin: 14.6 g/dL (ref 13.0–17.0)
Immature Granulocytes: 7 %
Lymphocytes Relative: 13 %
Lymphs Abs: 0.7 10*3/uL (ref 0.7–4.0)
MCH: 34.4 pg — ABNORMAL HIGH (ref 26.0–34.0)
MCHC: 35 g/dL (ref 30.0–36.0)
MCV: 98.3 fL (ref 80.0–100.0)
Monocytes Absolute: 1 10*3/uL (ref 0.1–1.0)
Monocytes Relative: 19 %
Neutro Abs: 3.1 10*3/uL (ref 1.7–7.7)
Neutrophils Relative %: 61 %
Platelets: 303 10*3/uL (ref 150–400)
RBC: 4.24 MIL/uL (ref 4.22–5.81)
RDW: 11.6 % (ref 11.5–15.5)
Smear Review: NORMAL
WBC: 5.1 10*3/uL (ref 4.0–10.5)
nRBC: 0 % (ref 0.0–0.2)

## 2020-10-16 LAB — HEPATITIS PANEL, ACUTE
HCV Ab: NONREACTIVE
Hep A IgM: NONREACTIVE
Hep B C IgM: NONREACTIVE
Hepatitis B Surface Ag: NONREACTIVE

## 2020-10-16 LAB — GC/CHLAMYDIA PROBE AMP (~~LOC~~) NOT AT ARMC
Chlamydia: NEGATIVE
Comment: NEGATIVE
Comment: NORMAL
Neisseria Gonorrhea: NEGATIVE

## 2020-10-16 LAB — ACTH STIMULATION, 3 TIME POINTS
Cortisol, 30 Min: 20.2 ug/dL
Cortisol, 60 Min: 23.2 ug/dL
Cortisol, Base: 19 ug/dL

## 2020-10-16 LAB — RPR: RPR Ser Ql: NONREACTIVE

## 2020-10-16 MED ORDER — FENTANYL CITRATE PF 50 MCG/ML IJ SOSY
12.5000 ug | PREFILLED_SYRINGE | Freq: Once | INTRAMUSCULAR | Status: AC
  Administered 2020-10-16: 12.5 ug via INTRAVENOUS
  Filled 2020-10-16: qty 1

## 2020-10-16 MED ORDER — SULFAMETHOXAZOLE-TRIMETHOPRIM 800-160 MG PO TABS
1.0000 | ORAL_TABLET | Freq: Every day | ORAL | Status: DC
  Administered 2020-10-16: 1 via ORAL
  Filled 2020-10-16: qty 1

## 2020-10-16 MED ORDER — FLUCONAZOLE 200 MG PO TABS
800.0000 mg | ORAL_TABLET | Freq: Every day | ORAL | 0 refills | Status: DC
  Filled 2020-10-16: qty 120, 30d supply, fill #0

## 2020-10-16 MED ORDER — MAGNESIUM SULFATE IN D5W 1-5 GM/100ML-% IV SOLN
1.0000 g | Freq: Once | INTRAVENOUS | Status: AC
  Administered 2020-10-16: 1 g via INTRAVENOUS
  Filled 2020-10-16: qty 100

## 2020-10-16 MED ORDER — MAGNESIUM SULFATE 50 % IJ SOLN
1.0000 g | Freq: Once | INTRAMUSCULAR | Status: DC
  Filled 2020-10-16: qty 2

## 2020-10-16 MED ORDER — FLUCONAZOLE 200 MG PO TABS
800.0000 mg | ORAL_TABLET | Freq: Every day | ORAL | Status: DC
  Administered 2020-10-16 – 2020-10-25 (×10): 800 mg via ORAL
  Filled 2020-10-16 (×4): qty 4
  Filled 2020-10-16: qty 8
  Filled 2020-10-16 (×3): qty 4
  Filled 2020-10-16: qty 8
  Filled 2020-10-16 (×2): qty 4

## 2020-10-16 MED ORDER — SULFAMETHOXAZOLE-TRIMETHOPRIM 800-160 MG PO TABS
1.0000 | ORAL_TABLET | Freq: Every day | ORAL | 0 refills | Status: DC
  Filled 2020-10-16: qty 30, 30d supply, fill #0

## 2020-10-16 MED ORDER — FENTANYL CITRATE PF 50 MCG/ML IJ SOSY: 12.5000 ug | PREFILLED_SYRINGE | Freq: Once | INTRAMUSCULAR | Status: DC

## 2020-10-16 MED ORDER — SULFAMETHOXAZOLE-TRIMETHOPRIM 800-160 MG PO TABS
1.0000 | ORAL_TABLET | Freq: Every day | ORAL | Status: DC
  Administered 2020-10-17 – 2020-10-21 (×5): 1 via ORAL
  Filled 2020-10-16 (×5): qty 1

## 2020-10-16 NOTE — Consult Note (Signed)
CARDIOLOGY CONSULT NOTE  Patient ID: Ricky [name] MRN: [id number] DOB/AGE: Aug 23, 1955 65 y.o.  Admit date: 10/12/2020 Referring Physician  Gladys Damme, MD Primary Physician:  Pcp, No Reason for Consultation  SVT  Patient ID: Ricky [name], male    DOB: 03/17/1955, 65 y.o.   MRN: [id number]  Chief Complaint  Patient presents with   Shortness of Breath   Chest Pain   HPI:    Ricky [name]  is a 65 y.o. AAM patient malewith h/o polysubstance use (onging), HIV, noncompliance, cigarette smoking, recent COVID-19 infection (January 2022), hypertension, protein calorie malnutrition, aortic atherosclerosis, cavitary consolidation in the left lung fields on recent CT imaging, emphysema. Now admitted with cough and fatigue and presumed Covid re infection (ruled out). 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  He now has recurrence of SVT and I was consulted for management.  Admitted to the hospital with palpitations, shortness of breath, generalized weakness.  Although chest pain was described on admission, today patient denied any chest pain states that he has been having occasional episodes of palpitations.  No dizziness or syncope.  Unfortunately admits to continued use of substances and also smoking cigarettes.  Past Medical History:  Diagnosis Date   Human immunodeficiency virus (HIV) disease ([place]) 03/26/2020   Past Surgical History:  Procedure Laterality Date   SVT ABLATION N/A 04/23/2020   Procedure: SVT ABLATION;  Surgeon: Evans Lance, MD;  Location: [place] [facility];  Service: Cardiovascular;  Laterality: N/A;   Social History   Tobacco Use   Smoking status: Every Day    Types: Cigarettes   Smokeless tobacco: Former  Substance Use Topics   Alcohol use: Yes    Alcohol/week: 14.0 - 21.0 standard drinks    Types: 14 - 21 Cans of beer per week    Family History  Problem Relation Age of  Onset   Hypertension Mother    Kidney disease Mother    Hypertension Sister    Hypertension Brother     Marital Status: Married  ROS  Review of Systems  Constitutional: Positive for malaise/fatigue.  Eyes: Negative.   Cardiovascular:  Positive for palpitations. Negative for chest pain and leg swelling.  Respiratory:  Positive for cough, shortness of breath and sputum production.   Skin: Negative.   Musculoskeletal: Negative.   Gastrointestinal:  Negative for melena.  All other systems reviewed and are negative. Objective   Vitals with BMI 10/16/2020 10/15/2020 10/15/2020  Height - - -  Weight - - -  BMI - - -  Systolic [id number]  Diastolic 93 90 94  Pulse 68 88 -    Blood pressure (!) 132/93, pulse 68, temperature 100.1 F (37.8 C), temperature source Oral, resp. rate 20, height '6\' 1"'$  (1.854 m), weight 77.1 kg, SpO2 96 %.    Physical Exam Neck:     Vascular: No carotid bruit or JVD.  Cardiovascular:     Rate and Rhythm: Normal rate and regular rhythm.     Pulses: Intact distal pulses.     Heart sounds: Normal heart sounds. No murmur heard.   No gallop.  Pulmonary:     Effort: Pulmonary effort is normal.     Breath sounds: Normal breath sounds.  Abdominal:     General: Bowel sounds are normal.     Palpations: Abdomen is soft.  Musculoskeletal:        General: No swelling. Normal  range of motion.     Cervical back: Neck supple.  Skin:    General: Skin is warm.     Capillary Refill: Capillary refill takes less than 2 seconds.  Neurological:     General: No focal deficit present.     Mental Status: He is alert.   Laboratory examination:   Recent Labs    10/13/20 0602 10/14/20 0522 10/15/20 0220  NA 128* 128* 127*  K 3.8 3.6 3.7  CL 99 99 98  CO2 22 21* 22  GLUCOSE 149* 139* 110*  BUN '11 14 11  '$ CREATININE 1.00 0.95 0.91  CALCIUM 8.1* 8.3* 8.5*  GFRNONAA >60 >60 >60   estimated creatinine clearance is 91.8 mL/min (by C-G formula based on SCr of 0.91  mg/dL).  CMP Latest Ref Rng & Units 10/15/2020 10/14/2020 10/13/2020  Glucose 70 - 99 mg/dL 110(H) 139(H) 149(H)  BUN 8 - 23 mg/dL '11 14 11  '$ Creatinine 0.61 - 1.24 mg/dL 0.91 0.95 1.00  Sodium 135 - 145 mmol/L 127(L) 128(L) 128(L)  Potassium 3.5 - 5.1 mmol/L 3.7 3.6 3.8  Chloride 98 - 111 mmol/L 98 99 99  CO2 22 - 32 mmol/L 22 21(L) 22  Calcium 8.9 - 10.3 mg/dL 8.5(L) 8.3(L) 8.1(L)  Total Protein 6.5 - 8.1 g/dL 7.4 7.1 7.3  Total Bilirubin 0.3 - 1.2 mg/dL 1.4(H) 1.3(H) 1.4(H)  Alkaline Phos 38 - 126 U/L 104 99 106  AST 15 - 41 U/L 59(H) 50(H) 47(H)  ALT 0 - 44 U/L '18 17 15   '$ CBC Latest Ref Rng & Units 10/15/2020 10/14/2020 10/13/2020  WBC 4.0 - 10.5 K/uL 4.4 3.7(L) 4.8  Hemoglobin 13.0 - 17.0 g/dL 13.4 13.3 13.5  Hematocrit 39.0 - 52.0 % 38.6(L) 38.5(L) 40.4  Platelets 150 - 400 K/uL 249 236 314   Lipid Panel Recent Labs    03/29/20 0255  CHOL 141  TRIG 118  LDLCALC 86  VLDL 24  HDL 31*  CHOLHDL 4.5    HEMOGLOBIN A1C No results found for: HGBA1C, MPG TSH Recent Labs    03/27/20 0949  TSH 0.628   BNP (last 3 results) Recent Labs    04/20/20 0357 04/21/20 0130 04/22/20 0323  BNP 54.3 113.7* 39.3  Cardiac Panel (last 3 results) Recent Labs    10/15/20 0220 10/15/20 0719 10/15/20 1041  TROPONINIHS '8 11 10     '$ Medications and allergies   Allergies  Allergen Reactions   Bactrim [Sulfamethoxazole-Trimethoprim] Other (See Comments)    Patient was trialed on DS TIW and SS daily for PJP prophylaxis and developed hyperkalemia and increased Scr     Current Meds  Medication Sig   acetaminophen (TYLENOL) 500 MG tablet Take 500 mg by mouth every 6 (six) hours as needed for moderate pain or headache.   ibuprofen (ADVIL) 200 MG tablet Take 200 mg by mouth every 6 (six) hours as needed for headache or moderate pain.    Scheduled Meds:  atovaquone  1,500 mg Oral Q breakfast   cosyntropin  0.25 mg Intravenous Once   dextromethorphan-guaiFENesin  1 tablet Oral BID    digoxin  0.25 mg Oral Daily   enoxaparin (LOVENOX) injection  40 mg Subcutaneous Q24H   feeding supplement  237 mL Oral TID BM   fluticasone  1 spray Each Nare Daily   folic acid  1 mg Oral Daily   multivitamin with minerals  1 tablet Oral Daily   Continuous Infusions:  remdesivir 100 mg in NS 100 mL Stopped (  10/15/20 1057)   PRN Meds:.acetaminophen, ibuprofen   No intake/output data recorded. No intake/output data recorded.   Radiology:     Cardiac Studies:    Echocardiogram 03/28/2020:  1. Left ventricular ejection fraction, by estimation, is 50 to 55%. The  left ventricle has low normal function. The left ventricle has no regional  wall motion abnormalities. There is mild concentric left ventricular  hypertrophy. Left ventricular  diastolic parameters are indeterminate.   2. Right ventricular systolic function is normal. The right ventricular  size is normal.   3. Left atrial size was mildly dilated.   4. Right atrial size was mildly dilated.   5. The mitral valve is normal in structure. Trivial mitral valve  regurgitation. No evidence of mitral stenosis.   6. The aortic valve is tricuspid. Aortic valve regurgitation is not  visualized. No aortic stenosis is present.   7. The inferior vena cava is normal in size with greater than 50%  respiratory variability, suggesting right atrial pressure of 3 mmHg.  Lexiscan Nuclear stress test 03/29/2020: Normal pharmacologic nuclear stress test with no evidence for prior infarct or ischemia. LVEF 51%.  SVT ablation aborted 04/23/2020: 1. Sinus rhythm upon presentation.  2. The patient had a concealed right para-hisian AP and catheter manipulation around the pathway resulted in bumping the pathway and rendered the pathway non-functional such that additional mapping and ablation could not be carried out. 3. No inducible arrhythmias following catheter manipulation.  5. No early apparent complications.   EKG:  EKG 10/12/2020: Normal  sinus rhythm at rate of 99 bpm, normal axis, nonspecific ST changes.  Telemetry: Recurrent episodes of PSVT Yesterday and today  Assessment  Ricky [name] is a 65 y.o.AAM patient malewith h/o polysubstance use (onging), HIV, noncompliance, cigarette smoking, recent COVID-19 infection (January 2022), hypertension, protein calorie malnutrition, aortic atherosclerosis, cavitary consolidation in the left lung fields on recent CT imaging, emphysema. Now admitted with cough and fatigue and presumed Covid re infection (ruled out). 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  He now has recurrence of SVT and I was consulted for management.  1.  Recurrent SVT, aborted AVNRT ablation on 04/23/2020 as patient developed nonfunctioning pathway after physical manipulation. 2.  Polysubstance use  Recommendations:   Patient has extreme high risk for development of complete AV node blockade if he has repeat attempt at AVNRT ablation.  Discussed curbside with Dr. Crissie Sickles who is in agreement at this time, unless SVT becomes incessant, medical therapy.  Continue to digoxin for now, watch for next 24 to 48 hours if there is recurrence, if indeed he has recurrence, will consider addition of a low-dose of long-acting calcium channel blocker.  Otherwise from cardiac standpoint is without any clinical evidence of heart failure, no angina pectoris, he has had a low risk stress test.  Primary prevention encouraged, I discussed with the patient regarding abstinence from tobacco use and also substance use.  We will continue to follow sidelines.   Adrian Prows, MD, [facility] 10/16/2020, 7:05 AM Office: [phone]

## 2020-10-16 NOTE — Progress Notes (Signed)
Family Medicine Teaching Service Daily Progress Note Intern Pager: [phone]  Patient name: Ricky [name] Medical record number: [id number] Date of birth: 1955-06-17 Age: 65 y.o. Gender: male  Primary Care Provider: Pcp, No Consultants: ID, neurology, cardiology Code Status: Full code  Pt Overview and Major Events to Date:  9/9 admitted 9/10 cefipime d/c'd; MRI brain showed no acute abnormalities, history subdural hematomas, left maxillary sinus air-fluid level 9/11 refused LP, started on fluconazole and atovaquone per ID 9/12 LP performed  Assessment and Plan:  Patient is a 65 year old man presenting with cough, fever and chest pain.  Past medical history significant for cryptococcal meningitis, HIV/AIDS, MAC, SVT s/p unsuccessful ablation, CKD, hypertension, dyslipidemia, protein calorie malnutrition, polysubstance abuse.  Headache, COVID-19, HIV AIDS with immunocompromised state Headache this morning is behind his eyes and requesting something stronger than tylenol.  Could be spinal headache, will reevaluate patient headache. Received LP yesterday which showed clear CSF. CSF culture and fungus culture pending.  CD4 T-cell antibodies less than 35, CD4 percent helper T-cell 4.  Predominantly mononuclear white blood cells presents on preliminary CSF gram stain no organisms.  Has remained afebrile, has had temperature 100.1 recently overnight. -ID consulted, appreciate assistance.  Continue with remdesivir course, continue atovaquone for PCP prophylaxis, fluconazole was held CSF culture, urine histo/blasto antigen and serum RPR pending.  CSF studies have cryptococcal antigen, VDRL, fungal and CSF culture pending.  Deferring ART currently.  -Neurology consulted, LP yesterday, appreciate assistance -Mucinex every 12 hours, Flonase -Tylenol 1000 mg every 6 hours -d/c'd NSAID while on digoxin  -Cardiac and pulse oximetry monitoring -Daily CBC CMP -Airborne and contact precautions -Remdesivir  for 4 days (day 4), no steroids given due to immunocompromise state. -Blood culture and Legionella pending -Incentive spirometry flutter valve -Follow-up CT in 3 months nodular opacities in left lung -consider additional pain medications if not improving; consider blood patch if spinal headache   SVT s/p unsuccessful ablation Patient has had 2 episodes of SVT during this hospitalization with a past unsuccessful ablation in March.  Cardiology recommended using digoxin overnight to help prevent SVT for this patient.  Uses cocaine. Was given 0.25 mg IV x1 doses, didn't get one 1 IV dose and switch to 0.25 mg oral today. -Cardiology following, appreciate recommendations. -Cardiac monitoring -K this morning 3.9, monitor this and magnesium on digoxin consider repleting   History of cryptococcal meningitis Had this in March 2022.  LP was performed yesterday. cryptococcus Ag 20. -Follow-up fungal cultures, no growth <24 hrs -Fluconazole 800 mg daily  Chronic hyponatremia Sodium on admission was 126, today was 127. 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 TSH was not elevated. Unlikely to be adrenal deficiency as cortisol studies were normal. -A.m. CMP  Elevated LFTs AST/ALT was 81/25 likely related to alcohol use.  Patient had no abdominal pain on exam today. -Monitor and CMP  Polysubstance abuse CIWA scores have been 0,0,0 overnight. -TOC consult for substance abuse resources -CIWA protocol -Folic acid injections -Multivitamin with minerals  Moderate protein calorie malnutrition Patient has chronic calcific pancreatitis and chronic malnutrition -RD following -Ensure   Hypertension Patient has not been on any medications currently.  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  Chest x-ray and CTA showed left upper lobe infiltration. -ID consulted and following, not  treating MAC due to patient's medication adherence and less importance medically.  Macrocytosis without anemia, resolved MCV 98.3 today,  hemoglobin 14.6  FEN/GI: Regular diet PPx: Lovenox Dispo: Home pending clinical improvement barriers include immunocompromise state, requires further medical work-up.  Subjective:  Complaining of headache behind eyes today. Patient was not compliant on exam today and wanting more than his tylenol and NSAID.  Objective: Temp:  [100 F (37.8 C)-100.1 F (37.8 C)] 100.1 F (37.8 C) (09/13 0419) Pulse Rate:  [67-88] 68 (09/13 0419) Resp:  [18-20] 20 (09/12 2015) BP: (129-140)/(90-94) 132/93 (09/13 0419) SpO2:  [95 %-97 %] 96 % (09/13 0419) Physical Exam: General: Complains of headache behind eye Neuro: refused neuro check on exam today due to headache Cardiovascular: RRR no m/r/g Respiratory: CTAB no iWOB Abdomen: Soft, nondistended, nontender to palpation Extremities: Moves freely  Laboratory: Recent Labs  Lab 10/13/20 0602 10/14/20 0522 10/15/20 0220  WBC 4.8 3.7* 4.4  HGB 13.5 13.3 13.4  HCT 40.4 38.5* 38.6*  PLT 314 236 249   Recent Labs  Lab 10/13/20 0602 10/14/20 0522 10/15/20 0220  NA 128* 128* 127*  K 3.8 3.6 3.7  CL 99 99 98  CO2 22 21* 22  BUN _0 CREATININE 1.00 0.95 0.91  CALCIUM 8.1* 8.3* 8.5*  PROT 7.3 7.1 7.4  BILITOT 1.4* 1.3* 1.4*  ALKPHOS 106 99 104  ALT _1 AST 47* 50* 59*  GLUCOSE 149* 139* 110*    Imaging/Diagnostic Tests:   Gerrit Heck, MD 10/16/2020, 6:30 AM PGY-1, [place] Intern pager: [phone], text pages welcome

## 2020-10-16 NOTE — Plan of Care (Signed)

## 2020-10-16 NOTE — Progress Notes (Signed)
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 He has stable chronic dry cough without worsening dypsnea  His serum crypto ag is 320. Repeat cd4/hiv vl in process  Patient initially refused LP but now agreeable. I discussed with him need to r/o active culturable cryptococcus involvement of the brain. If that is the case will need to reinduce treatment and defer ART. If no culturable crypto agent, can continue with fluconazole. Would then perhaps also start ART  I have also sent urine histo/blasto. Pending LP  Other oi consideration. Cxr stable persistent cystic/nodular chagnes left upper lobe without new pna. Agree less likely to represent active NTM lung process or new pneumocystis process. Previous MAC likely colonization.   Unclear covid is incidental finding. He has chronic cough unchanged and not on o2 supplement.   --------------- 9/13 assessment 9/12 csf glucose/protein normal and <5wbc although serum cd4 is <35; unfortunately no opening pressure recorded. Fungal cx is in process Complains of some gi side effect with 800 mg fluconazole   PLAN: F/u csf fungal cx, urine histo/blasto ag Can resume fluconazole 800 mg once daily (discussed with pharmacy; will try to break up into 400 mg q1hour each  day If csf cx is negative, will transition fluconazole to 400 mg once a day He had azotemia/hyperkalemia with bactrim previously; no true allergy; will change atovaquone to bactrim pjp prophy to promote compliance Defer ART at this time until we know csf cx is negative for crypto Monitor renal function for the next day or two. If labs and clinically stable ok to dc from id standpoint  Finish remdesivir course for incidental covid finding per primary team   I spent more than 35 minute reviewing data/chart, and coordinating care and >50% direct face to face time providing counseling/discussing diagnostics/treatment plan with patient  Active Problems:   Meningitis, cryptococcal ([place])   AIDS (acquired immune deficiency syndrome) ([facility])   Paroxysmal SVT (supraventricular tachycardia) ([place])   Mycobacterium avium complex ([place])   COVID-19   COVID-19 virus infection   Community acquired pneumonia   Nonadherence to medical treatment   HIV infection ([place])   Allergies  Allergen Reactions   Bactrim [Sulfamethoxazole-Trimethoprim] Other (See Comments)    Patient was trialed on DS TIW and SS daily for PJP prophylaxis and developed hyperkalemia and increased Scr    Scheduled Meds:  dextromethorphan-guaiFENesin  1 tablet Oral BID   digoxin  0.25 mg Oral Daily   enoxaparin (LOVENOX) injection  40 mg Subcutaneous Q24H   feeding supplement  237 mL Oral TID BM   fluconazole  800 mg Oral Daily   fluticasone  1 spray Each Nare Daily   folic acid  1 mg Oral Daily   multivitamin with minerals  1 tablet Oral Daily   sulfamethoxazole-trimethoprim  1 tablet Oral Daily  Continuous Infusions:   PRN Meds:.acetaminophen   SUBJECTIVE: Lp done yesterday csf fluid looking bland Gi side effect per patient from 800 mg fluconazole Some headache today No diarrhea/rash No dyspnea Dry cough stable  Review of Systems: ROS All other ROS was negative, except mentioned above     OBJECTIVE: Vitals:    10/15/20 0800 10/15/20 1807 10/15/20 2015 10/16/20 0419  BP: 140/90 (!) 129/94 130/90 (!) 132/93  Pulse: 67  88 68  Resp: _0 Temp:  100.1 F (37.8 C) 100 F (37.8 C) 100.1 F (37.8 C)  TempSrc:  Oral Oral Oral  SpO2: 97% 96% 95% 96%  Weight:      Height:       Body mass index is 22.43 kg/m.  Physical Exam  General/constitutional: no distress, pleasant HEENT: Normocephalic, PER, Conj Clear, EOMI, Oropharynx clear Neck supple CV: rrr no mrg Lungs: clear to auscultation, normal respiratory effort Abd: Soft, Nontender Ext: no edema Skin: scabbed over small plaque on bilateral upper ex Neuro: nonfocal MSK: no peripheral joint swelling/tenderness/warmth; back spines nontender     Lab Results Lab Results  Component Value Date   WBC 5.1 10/16/2020   HGB 14.6 10/16/2020   HCT 41.7 10/16/2020   MCV 98.3 10/16/2020   PLT 303 10/16/2020    Lab Results  Component Value Date   CREATININE 0.94 10/16/2020   BUN 11 10/16/2020   NA 127 (L) 10/16/2020   K 3.9 10/16/2020   CL 94 (L) 10/16/2020   CO2 24 10/16/2020    Lab Results  Component Value Date   ALT 25 10/16/2020   AST 81 (H) 10/16/2020   ALKPHOS 107 10/16/2020   BILITOT 1.7 (H) 10/16/2020      Microbiology: Recent Results (from the past 240 hour(s))  Resp Panel by RT-PCR (Flu A&B, Covid) Nasopharyngeal Swab     Status: Abnormal   Collection Time: 10/12/20 11:34 AM   Specimen: Nasopharyngeal Swab; Nasopharyngeal(NP) swabs in vial transport medium  Result Value Ref Range Status   SARS Coronavirus 2 by RT PCR POSITIVE (A) NEGATIVE Final    Comment: RESULT CALLED TO, READ BACK BY AND VERIFIED WITH: Edwyna Shell RN [id number] 10/12/20 A BROWNING (NOTE) SARS-CoV-2 target nucleic acids are DETECTED.  The SARS-CoV-2 RNA is generally detectable in upper respiratory specimens during the acute phase of infection. Positive results are indicative of the presence of the identified virus, but do not rule out bacterial  infection or co-infection with other pathogens not detected by the test. Clinical correlation with patient history and other diagnostic information is necessary to determine patient infection status. The expected result is Negative.  Fact Sheet for Patients: EntrepreneurPulse.com.au  Fact Sheet for Healthcare Providers: IncredibleEmployment.be  This test is not yet approved or cleared by the [place] FDA and  has been authorized for detection and/or diagnosis of SARS-CoV-2 by FDA under an Emergency Use Authorization (EUA).  This EUA will remain in effect (meaning this test ca n be used) for the duration of  the COVID-19 declaration under Section 564(b)(1) of the Act, 21 U.S.C. section 360bbb-3(b)(1), unless the authorization is terminated or revoked sooner.     Influenza A by PCR NEGATIVE NEGATIVE Final   Influenza B by PCR NEGATIVE NEGATIVE Final    Comment: (NOTE) The Xpert Xpress SARS-CoV-2/FLU/RSV plus assay is intended as an aid in the diagnosis of influenza from Nasopharyngeal swab specimens and should not be used as a sole basis for treatment. Nasal washings and aspirates  are unacceptable for Xpert Xpress SARS-CoV-2/FLU/RSV testing.  Fact Sheet for Patients: EntrepreneurPulse.com.au  Fact Sheet for Healthcare Providers: IncredibleEmployment.be  This test is not yet approved or cleared by the [place] FDA and has been authorized for detection and/or diagnosis of SARS-CoV-2 by FDA under an Emergency Use Authorization (EUA). This EUA will remain in effect (meaning this test can be used) for the duration of the COVID-19 declaration under Section 564(b)(1) of the Act, 21 U.S.C. section 360bbb-3(b)(1), unless the authorization is terminated or revoked.  Performed at [facility], [place] [address], [place], [place] [address]   Blood culture (routine x 2)     Status: None (Preliminary  result)   Collection Time: 10/12/20  2:45 PM   Specimen: BLOOD LEFT FOREARM  Result Value Ref Range Status   Specimen Description BLOOD LEFT FOREARM  Final   Special Requests   Final    BOTTLES DRAWN AEROBIC AND ANAEROBIC Blood Culture adequate volume   Culture   Final    NO GROWTH 4 DAYS Performed at [facility], [place] [address], [place], [place] [address]    Report Status PENDING  Incomplete  Blood culture (routine x 2)     Status: None (Preliminary result)   Collection Time: 10/12/20  5:10 PM   Specimen: BLOOD RIGHT WRIST  Result Value Ref Range Status   Specimen Description BLOOD RIGHT WRIST  Final   Special Requests   Final    BOTTLES DRAWN AEROBIC AND ANAEROBIC Blood Culture adequate volume   Culture   Final    NO GROWTH 4 DAYS Performed at [facility], [place] [address] [place], [place] [address]    Report Status PENDING  Incomplete  CSF culture w Stat Gram Stain     Status: None (Preliminary result)   Collection Time: 10/15/20  4:21 PM   Specimen: CSF; Cerebrospinal Fluid  Result Value Ref Range Status   Specimen Description CSF  Final   Special Requests NONE  Final   Gram Stain   Final    WBC PRESENT, PREDOMINANTLY MONONUCLEAR NO ORGANISMS SEEN CYTOSPIN SMEAR    Culture   Final    NO GROWTH < 24 HOURS Performed at [facility], [place] [address], [place], [place] [address]    Report Status PENDING  Incomplete     Serology:   Imaging: If present, new imagings (plain films, ct scans, and mri) have been personally visualized and interpreted; radiology reports have been reviewed. Decision making incorporated into the Impression / Recommendations  9/10 mri brain 1. No evidence of acute intracranial abnormality on this motion limited study. 2. Similar trace bilateral anterior frontal convexity subdural hematomas. 3. Paranasal sinus disease with air-fluid level in the left maxillary sinus.   9/09 cxr 1. Persistent cystic and nodular opacities  within the left upper lobe. As described on previous CT, these findings are favored to represent sequelae of atypical infection. 2. No acute superimposed airspace consolidation.  Jabier Mutton, [facility] for Infectious [place] [phone] pager    10/16/2020, 11:42 AM

## 2020-10-16 NOTE — Progress Notes (Addendum)
FPTS Interim Progress Note  Patient's headache likely secondary to LP procedure yesterday. He may benefit form a blood patch. Contacted anesthesia regarding this who recommended speaking with IR as they primarily are involved with epidurals after labor. IR recommended diagnostic radiology who recommended speaking with Dr. Chancy Milroy from fluoroscopic radiology. Spoke with patient who agrees to the blood patch as his headache has improved but has not resolved and would like to try anything that may help. Spoke with Dr. Chancy Milroy who will get back to primary team regarding pursuing with intervention. Later directed to Dr. Vernard Gambles with radiology who recommends fluids and that patient remains flat for another 24 hours. They will consider blood patch tomorrow if headache not resolved. Dr. Vernard Gambles also recommended hold lovenox in case of possible intervention tomorrow. Also recommended speaking with neurology to inform them of this potential complication of LP, paged on-call NP Ricky [name] and conveyed this. She believes symptoms are possibly not due to the LP but defers to primary team regarding further intervention. Updated patient on plan, patient is agreeable. Discussed with nurse as well.   Ricky Dice, DO 10/16/2020, 2:02 PM PGY-2, [facility] Medicine Service pager [phone]

## 2020-10-16 NOTE — Progress Notes (Signed)
FPTS Interim Progress Note  S: Only complaint tonight is a headache.  He states it is about the same as earlier, no better no worse.  States is most on the frontal aspect of his head and somewhat behind his eyes bilaterally.  O: BP (!) 126/91 (BP Location: Left Arm)   Pulse 72   Temp 98.1 F (36.7 C) (Oral)   Resp 20   Ht '6\' 1"'$  (1.854 m)   Wt 77.1 kg   SpO2 98%   BMI 22.43 kg/m    General: Alert and oriented, no apparent distress Heart: Regular rate and rhythm with no murmurs appreciated Lungs: CTA bilaterally, no wheezing Neuro: No obvious focal deficits  A/P: Patient continues to complain of a headache.  States is no better or no worse than previous but would like to try something for it.  Noted in his chart that he previously refused his as needed ibuprofen but he is taking his scheduled acetaminophen.  Reached out to his nurse to have them offer the ibuprofen again.  Noted previous documentation that the thought is that headache is likely secondary to his LP procedure and he may benefit from a blood patch.  We will see if he takes the ibuprofen tonight and will continue with scheduled Tylenol.  He has no other concerns or complaints at this time.  The remainder per day team progress note  Lurline Del, DO 10/16/2020, 8:32 PM PGY-3, [facility] Medicine Service pager [phone]

## 2020-10-16 NOTE — Plan of Care (Signed)
  Problem: Education: Goal: Knowledge of General Education information will improve Description: Including pain rating scale, medication(s)/side effects and non-pharmacologic comfort measures Outcome: Not Progressing   Problem: Health Behavior/Discharge Planning: Goal: Ability to manage health-related needs will improve Outcome: Not Progressing   Problem: Clinical Measurements: Goal: Ability to maintain clinical measurements within normal limits will improve Outcome: Not Progressing Goal: Will remain free from infection Outcome: Not Progressing Goal: Diagnostic test results will improve Outcome: Not Progressing Goal: Respiratory complications will improve Outcome: Not Progressing Goal: Cardiovascular complication will be avoided Outcome: Not Progressing   Problem: Safety: Goal: Ability to remain free from injury will improve Outcome: Not Progressing   

## 2020-10-16 NOTE — Progress Notes (Signed)
FPTS Interim Progress Note  S: Patient headache is improved from this AM with tylenol however he still complains of pain behind his eyes, and at the back of his head. He feels it most between his eyes too. He says his back pain was resolved by the lumbar puncture however this headache is new from the headache at the beginning of this admission. He says it is worse when he stands up and is pulsating in nature. He says he's had no vision changes.  O: BP (!) 126/91 (BP Location: Left Arm)   Pulse 72   Temp 98.1 F (36.7 C) (Oral)   Resp 20   Ht '6\' 1"'$  (1.854 m)   Wt 77.1 kg   SpO2 98%   BMI 22.43 kg/m    Gen: Non-toxic, complains of HA Neuro: EOMI, pupils same size bilaterally, facial sensation intact bilaterally, facial movement symmetric, hearing intact, shoulder elevation symmetric, grip strength intact bilaterally   A/P: Headache -fentanyl 12.5 mcg once -consider blood patch if not resolving   Gerrit Heck, MD 10/16/2020, 1:32 PM PGY-1, [facility] Service pager [phone]

## 2020-10-17 ENCOUNTER — Other Ambulatory Visit (HOSPITAL_COMMUNITY): Payer: Self-pay

## 2020-10-17 ENCOUNTER — Inpatient Hospital Stay (HOSPITAL_COMMUNITY): Payer: Medicaid Other

## 2020-10-17 ENCOUNTER — Other Ambulatory Visit: Payer: Self-pay | Admitting: Internal Medicine

## 2020-10-17 DIAGNOSIS — U071 COVID-19: Secondary | ICD-10-CM | POA: Diagnosis not present

## 2020-10-17 DIAGNOSIS — J189 Pneumonia, unspecified organism: Secondary | ICD-10-CM | POA: Diagnosis not present

## 2020-10-17 DIAGNOSIS — B20 Human immunodeficiency virus [HIV] disease: Secondary | ICD-10-CM

## 2020-10-17 DIAGNOSIS — B451 Cerebral cryptococcosis: Secondary | ICD-10-CM | POA: Diagnosis not present

## 2020-10-17 HISTORY — PX: IR FL GUIDED LOC OF NEEDLE/CATH TIP FOR SPINAL INJECTION LT: IMG2396

## 2020-10-17 LAB — COMPREHENSIVE METABOLIC PANEL
ALT: 26 U/L (ref 0–44)
AST: 71 U/L — ABNORMAL HIGH (ref 15–41)
Albumin: 2.2 g/dL — ABNORMAL LOW (ref 3.5–5.0)
Alkaline Phosphatase: 109 U/L (ref 38–126)
Anion gap: 9 (ref 5–15)
BUN: 11 mg/dL (ref 8–23)
CO2: 22 mmol/L (ref 22–32)
Calcium: 8.7 mg/dL — ABNORMAL LOW (ref 8.9–10.3)
Chloride: 94 mmol/L — ABNORMAL LOW (ref 98–111)
Creatinine, Ser: 0.82 mg/dL (ref 0.61–1.24)
GFR, Estimated: >60 mL/min (ref >60)
Glucose, Bld: 172 mg/dL — ABNORMAL HIGH (ref 70–99)
Potassium: 3.8 mmol/L (ref 3.5–5.1)
Sodium: 125 mmol/L — ABNORMAL LOW (ref 135–145)
Total Bilirubin: 1.4 mg/dL — ABNORMAL HIGH (ref 0.3–1.2)
Total Protein: 7.4 g/dL (ref 6.5–8.1)

## 2020-10-17 LAB — CULTURE, BLOOD (ROUTINE X 2)
Culture: NO GROWTH
Culture: NO GROWTH
Special Requests: ADEQUATE
Special Requests: ADEQUATE

## 2020-10-17 LAB — CBC WITH DIFFERENTIAL/PLATELET
Abs Immature Granulocytes: 0.2 10*3/uL — ABNORMAL HIGH (ref 0.00–0.07)
Band Neutrophils: 2 %
Basophils Absolute: 0 10*3/uL (ref 0.0–0.1)
Basophils Relative: 1 %
Eosinophils Absolute: 0.1 10*3/uL (ref 0.0–0.5)
Eosinophils Relative: 3 %
HCT: 37.6 % — ABNORMAL LOW (ref 39.0–52.0)
Hemoglobin: 12.9 g/dL — ABNORMAL LOW (ref 13.0–17.0)
Lymphocytes Relative: 7 %
Lymphs Abs: 0.3 10*3/uL — ABNORMAL LOW (ref 0.7–4.0)
MCH: 33.9 pg (ref 26.0–34.0)
MCHC: 34.3 g/dL (ref 30.0–36.0)
MCV: 98.7 fL (ref 80.0–100.0)
Metamyelocytes Relative: 1 %
Monocytes Absolute: 0.7 10*3/uL (ref 0.1–1.0)
Monocytes Relative: 17 %
Myelocytes: 3 %
Neutro Abs: 2.9 10*3/uL (ref 1.7–7.7)
Neutrophils Relative %: 66 %
Platelets: 302 10*3/uL (ref 150–400)
RBC: 3.81 MIL/uL — ABNORMAL LOW (ref 4.22–5.81)
RDW: 11.6 % (ref 11.5–15.5)
WBC: 4.2 10*3/uL (ref 4.0–10.5)
nRBC: 0 % (ref 0.0–0.2)
nRBC: 0 /100 WBC

## 2020-10-17 LAB — MAGNESIUM: Magnesium: 2.1 mg/dL (ref 1.7–2.4)

## 2020-10-17 LAB — VDRL, CSF: VDRL Quant, CSF: NONREACTIVE

## 2020-10-17 IMAGING — XA IR FLUORO GUIDE SPINAL/SI JT INJ*L*
1 series · 1 of 1 positions shown · IV contrast (isovue)
Comparison: None.

CLINICAL DATA: Spinal headache post lumbar puncture

EXAM:
LUMBAR EPIDUROGRAM AND BLOOD PATCH
FLUOROSCOPY TIME:  18 seconds; 1 mGy
TECHNIQUE: The procedure, risks (including but not limited to bleeding,
infection, organ damage ), benefits, and alternatives were explained
to the patient. Questions regarding the procedure were encouraged
and answered. The patient understands and consents to the procedure.
The skin entry site from previous lumbar puncture was localized at
L4-5, to the right of midline. An appropriate skin entry site was
determined. Operator donned sterile gloves and mask. Skin site was
marked, prepped with Betadine, and draped in usual sterile fashion,
and infiltrated locally with 1% lidocaine. The 20 gauge Crawford
needle was advanced into the lumbar posterior epidural space near
the midline using a right parasagittal interlaminar approach at
L4-[LN] loss of resistance technique. Diagnostic injection of 2ml
Isovue-M 200 demonstrates good epidural spread above and below the
needle tip and crossing the midline. No intravascular or
subarachnoid component. 20 ml of autologous blood were then
administered as lumbar epidural blood patch. No immediate
complication.

[Series 2: fl (-) angio · 1 of 1 slices shown]
[im 1/1]
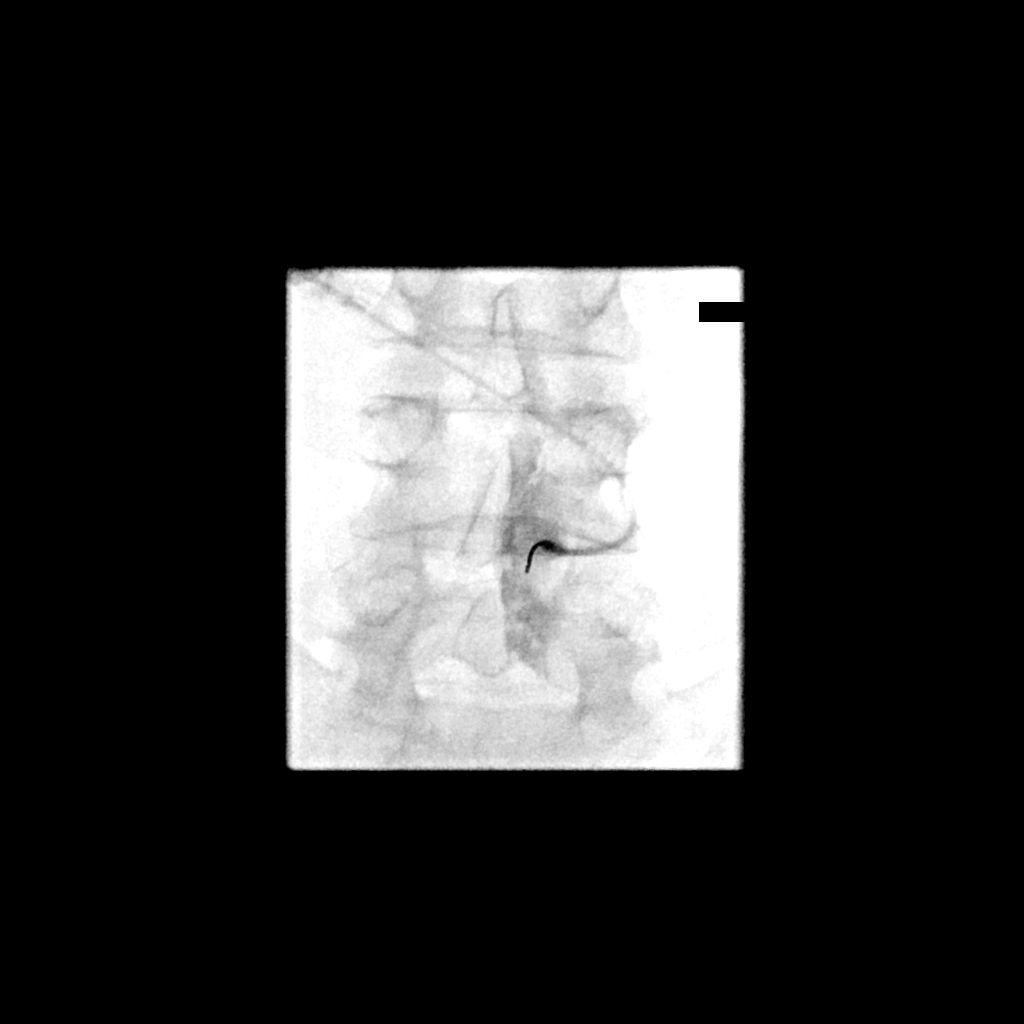

[1 of 1 positions shown; findings below may reference images not displayed]

IMPRESSION: 1. Technically successful lumbar epidural blood patch under
fluoroscopy. The patient was counseled on the importance of laying
flat for an additional 24 hours and maintaining good p.o. fluid
intake.

## 2020-10-17 MED ORDER — IOHEXOL 180 MG/ML  SOLN
20.0000 mL | Freq: Once | INTRAMUSCULAR | Status: AC | PRN
  Administered 2020-10-17: 5 mL

## 2020-10-17 MED ORDER — LIDOCAINE HCL (PF) 1 % IJ SOLN
INTRAMUSCULAR | Status: DC | PRN
  Administered 2020-10-17: 5 mL

## 2020-10-17 MED ORDER — ENOXAPARIN SODIUM 40 MG/0.4ML IJ SOSY
40.0000 mg | PREFILLED_SYRINGE | INTRAMUSCULAR | Status: DC
  Administered 2020-10-17 – 2020-10-25 (×9): 40 mg via SUBCUTANEOUS
  Filled 2020-10-17 (×11): qty 0.4

## 2020-10-17 MED ORDER — LIDOCAINE HCL 1 % IJ SOLN
INTRAMUSCULAR | Status: AC
  Filled 2020-10-17: qty 20

## 2020-10-17 MED ORDER — METOPROLOL TARTRATE 5 MG/5ML IV SOLN
INTRAVENOUS | Status: AC
  Filled 2020-10-17: qty 5

## 2020-10-17 MED ORDER — VERAPAMIL HCL ER 180 MG PO TBCR
180.0000 mg | EXTENDED_RELEASE_TABLET | Freq: Every day | ORAL | Status: DC
  Administered 2020-10-17: 180 mg via ORAL
  Filled 2020-10-17 (×2): qty 1

## 2020-10-17 NOTE — Progress Notes (Signed)
FPTS Interim Progress Note  S:Received page from RN that patient was having tachycardia to 180s for several minutes. Went to see patient, who was resting in bed. He reports that he felt the palpitations and had some dizziness, but his dizziness has been ongoing since LP. No chest pain.  O: BP (!) 122/92 (BP Location: Left Arm)   Pulse 64   Temp 98.1 F (36.7 C) (Oral)   Resp 20   Ht '6\' 1"'$  (1.854 m)   Wt 77.1 kg   SpO2 97%   BMI 22.43 kg/m   Nursing note and vitals reviewed GEN: age appropriate AAM, resting comfortably in bed, NAD, thin appearing HEENT: NCAT. PERRLA. Sclera without injection or icterus. MMM. Face appears flushed  Cardiac: Regular rate and rhythm. Normal S1/S2. No murmurs, rubs, or gallops appreciated. 2+ radial pulses. Lungs: Clear bilaterally to ascultation. No increased WOB, no accessory muscle usage. No w/r/r. Neuro: AOx3  Ext: no edema Psych: Pleasant and appropriate   A/P: Paroxysmal SVT On review of telemetry, patient was in SVT to 170-180s for approximately 6 minutes. No CP. He converted to NSR on his own. He is currently on digoxin 0.'25mg'$  PO BID and verapamil 180 PO qd. He received first dose of verapamil at 1053. I contacted Dr. Irven Shelling office, awaiting further recommendations.  Gladys Damme, MD 10/17/2020, 11:56 AM PGY-3, [facility] Medicine Service pager [phone]

## 2020-10-17 NOTE — Procedures (Signed)
  Procedure: Lumbar blood patch under fluoro L4/5 EBL:   minimal Complications:  none immediate  See full dictation in [facility].  Dillard Cannon MD Main # [phone] Pager  [phone]

## 2020-10-17 NOTE — Progress Notes (Signed)
FPTS Interim Progress Note  Patient still having headache with positional changes, likely secondary to recent LP. Would like to continue to proceed with blood patch. Discussed with Dr. Vernard Gambles to confirm patient is still on the schedule today for blood patch procedure which he agreed. Plan to restart lovenox after procedure. Appreciate recommendations and expertise of Dr. Vernard Gambles.   Donney Dice, DO 10/17/2020, 8:42 AM PGY-2, [facility] Medicine Service pager [phone]

## 2020-10-17 NOTE — Progress Notes (Signed)
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

## 2020-10-17 NOTE — Progress Notes (Signed)
Family Medicine Teaching Service Daily Progress Note Intern Pager: [phone]  Patient name: Ricky [name] Medical record number: [id number] Date of birth: [birth year] Age: 65 y.o. Gender: male  Primary Care Provider: Pcp, No Consultants: ID, Neurology, cardiology Code Status: Full code  Pt Overview and Major Events to Date:  9/9 admitted 9/10 cefepime DC'd; MRI brain showed no acute abnormalities, history subdural hematomas, left maxillary sinus air-fluid level 9/11 refused LP, started on fluconazole and atovaquone per ID 9/12 LP performed 9/13 suspected spinal headache, switched to Bactrim for PJP coverage  Assessment and Plan:  65 yo male who presented with cough fever and chest pain with PMH HIV/AIDs, MAC, SVTs s/p unsuccessful, CKD, HTN, HLD, protein calorie malnutrition, polysubstance abuse.  Headache, COVID-19, HIV/AIDS with immunocompromise state Headache this morning is behind his eyes. No longer feels pain at the back of his head. Back pain is resolved. Feels dizzy when standing but has been resting for the most part. Received Tylenol and 1 dose of fentanyl yesterday which he says does not help.  2 days ago received LP.  No growth to date for CSF or blood cultures.  Fungus cultures pending.  WBC 4.2 from 5.1, hemoglobin 12.9 from 14.6. Negative Gonnorhea, Hep A, Hep B, HCV/ Has remained afebrile with temperature of 98.3.Has ahd sweats he says after taking tylenol sometimes. -ID consulted, appreciate assistance.  Completed remdesivir course yesterday.  So switched to Bactrim for PJP prophylaxis, fluconazole, urine histo/blasto antigen pending.   -Neurology consulted, LP performed 2 days ago -Mucinex every 12 hours, Flonase -Tylenol 1000 mg every 6 hours prn -S/p fentanyl one-time dose -Consider blood patch today for spinal headache, lovenox has been held for this; restart after performed -Cardiac pulse oximetry monitoring -Daily CBC CMP -Airborne and contact  precautions -Completed remdesivir course -Follow-up blood and CSF cultures -Incentive spirometry flutter valve -Follow-up CT in 3 months nodular opacities in left lung  SVT s/p unsuccessful ablation Patient had 2 episodes of SVT in AM per cardiology recommended using digoxin to help prevent SVT for this patient as he has history of using cocaine.  Was given 0.25 mg IV x1 and switched to oral digoxin yesterday. -Cardiology following, appreciate recommendations: High risk for AV node blockade if repeat AVNRT ablation. Added verapamil this AM. Digoxin continued. -Cardiac monitoring -K this morning is 3.8, monitor this and magnesium 2.1 on digoxin. 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  LP was performed and CSF cryptococcus antigen 20. -Follow-up fungal cultures, no growth less than 24 hours. Check in 2 weeks. -Fluconazole per ID  Chronic hyponatremia Sodium on admission was 126, today was 125.  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 -Monitor on a.m. CMP's  Elevated LFTs AST/ALT was 71/26 slightly decreased from yesterday.  Likely related to alcohol use.  No abdominal pain on exam. -Monitor on CMP's  Polysubstance abuse CIWA scores have been 0,0 overnight.  Had CIWA's of 5-6 yesterday morning because of headache. -TOC consult for substance abuse resources -CIWA protocol -Folate -Multivitamin with minerals  Moderate protein calorie malnutrition Patient has chronic calcific pancreatitis and chronic malnutrition. -RD following -Ensure -Multivitamin with minerals  Hypertension Blood pressures have been 130s over 90s. -Continue monitoring -verapamil added per cardiology  MAC Diagnosed in March 2020 she was discharged on azithromycin, rifabutin and ethambutol but did not complete this course.  Chest x-ray and CTA showed left upper lobe infiltration. -ID consulted and following, not treating currently due to  patient compliance and less important medically currently.  Macrocytosis without anemia, resolved MCV 98.7 and hemoglobin 12.9.  FEN/GI: Regular PPx: Lovenox held for possible blood patch today Dispo:Home pending clinical improvement   Subjective:  Patient complains of headache behind his eyes this AM, no longer at the back of his head. When he stands he feels dizzy but no increase in his pain.   Objective: Temp:  [98.1 F (36.7 C)-98.3 F (36.8 C)] 98.3 F (36.8 C) (09/13 2035) Pulse Rate:  [69-72] 69 (09/13 2035) Resp:  [20] 20 (09/13 2035) BP: (126-135)/(91-95) 135/95 (09/13 2035) SpO2:  [98 %-99 %] 99 % (09/13 2035) Physical Exam: General: NAD Neuro: EOMI, Facial sensation intact bilaterally, facial muscles symmetric, hearing intact, shoulder elevation symmetric, tongue no deviations or fasciulations Cardiovascular: RRR no m/r/g Respiratory: CTAB no iWOB Abdomen: Nondistended Extremities: Moves freely, no edema  Laboratory: Recent Labs  Lab 10/15/20 0220 10/16/20 0758 10/17/20 0215  WBC 4.4 5.1 4.2  HGB 13.4 14.6 12.9*  HCT 38.6* 41.7 37.6*  PLT 249 303 302   Recent Labs  Lab 10/15/20 0220 10/16/20 0758 10/17/20 0215  NA 127* 127* 125*  K 3.7 3.9 3.8  CL 98 94* 94*  CO2 _0 BUN _1 CREATININE 0.91 0.94 0.82  CALCIUM 8.5* 8.8* 8.7*  PROT 7.4 8.2* 7.4  BILITOT 1.4* 1.7* 1.4*  ALKPHOS 104 107 109  ALT _2 AST 59* 81* 71*  GLUCOSE 110* 116* 172*     Imaging/Diagnostic Tests: No results found.   Gerrit Heck, MD 10/17/2020, 6:14 AM PGY-1, [place] Intern pager: [phone], text pages welcome

## 2020-10-17 NOTE — Plan of Care (Signed)

## 2020-10-17 NOTE — Progress Notes (Signed)
Outpatient referal to thp

## 2020-10-17 NOTE — Progress Notes (Signed)
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 He has stable chronic dry cough without worsening dypsnea  His serum crypto ag is 320. Repeat cd4/hiv vl in process  Patient initially refused LP but now agreeable. I discussed with him need to r/o active culturable cryptococcus involvement of the brain. If that is the case will need to reinduce treatment and defer ART. If no culturable crypto agent, can continue with fluconazole. Would then perhaps also start ART  I have also sent urine histo/blasto. Pending LP  Other oi consideration. Cxr stable persistent cystic/nodular chagnes left upper lobe without new pna. Agree less likely to represent active NTM lung process or new pneumocystis process. Previous MAC likely colonization.   Unclear covid is incidental finding. He has chronic cough unchanged and not on o2 supplement.   --------------- 9/14 assessment 9/12 csf glucose/protein normal and <5wbc although serum cd4 is <35; unfortunately no opening pressure recorded. Fungal cx is in process Didn't complain of fluconazole nausea today Headache better today     PLAN: ID team will follow csf fungal cx, urine histo/blasto ag Continue fluconazole 800 mg daily (he seems to tolerate 400 mg qhour x2) Id clinic  f/u Dr West Bali on 9/21 @ 1030 If csf cx is negative, will transition fluconazole to 400 mg once a day Continue bactrim pjp prophy Defer ART at this time until we know csf cx is negative for crypto  7.   Patient concern about going back to his current dwelling. Please discuss with case manager to see if an alternative living arrangement can be made 8.   Will also refer him to our id clinic THP service   I spent more than 35 minute reviewing data/chart, and coordinating care and >50% direct face to face time providing counseling/discussing diagnostics/treatment plan with patient   Active Problems:   Meningitis, cryptococcal ([place])   AIDS (acquired immune deficiency syndrome) ([place])   Paroxysmal SVT (supraventricular tachycardia) ([place])   Mycobacterium avium complex ([place])   COVID-19   COVID-19 virus infection   Community acquired pneumonia   Nonadherence to medical treatment   HIV infection ([place])   Allergies  Allergen Reactions   Bactrim [Sulfamethoxazole-Trimethoprim] Other (See Comments)    Patient was trialed on DS TIW and SS daily for PJP prophylaxis and developed hyperkalemia and increased Scr    Scheduled Meds:  dextromethorphan-guaiFENesin  1 tablet Oral BID   digoxin  0.25 mg Oral Daily   feeding supplement  237 mL Oral TID BM   fluconazole  800 mg Oral Daily   fluticasone  1 spray Each Nare Daily   folic acid  1 mg Oral Daily   multivitamin with minerals  1 tablet Oral Daily   sulfamethoxazole-trimethoprim  1 tablet Oral Daily   verapamil  180 mg Oral Daily  Continuous Infusions:   PRN Meds:.acetaminophen   SUBJECTIVE: Headache much better today No f/c Tolerating fluconazole No n/v/diarrhea No dyspnea Dry cough stable/improving  Review of Systems: ROS All other ROS was negative, except mentioned above     OBJECTIVE: Vitals:   10/15/20 2015 10/16/20 0419 10/16/20 1159 10/16/20 2035  BP: 130/90 (!) 132/93 (!) 126/91 (!) 135/95  Pulse: 88 68 72 69   Resp: _0 Temp: 100 F (37.8 C) 100.1 F (37.8 C) 98.1 F (36.7 C) 98.3 F (36.8 C)  TempSrc: Oral Oral Oral Oral  SpO2: 95% 96% 98% 99%  Weight:      Height:       Body mass index is 22.43 kg/m.  Physical Exam  General/constitutional: no distress, pleasant HEENT: Normocephalic, PER, Conj Clear, EOMI, Oropharynx clear Neck supple CV: rrr no mrg Lungs: clear to auscultation, normal respiratory effort Abd: Soft, Nontender Ext: no edema Skin: No Rash Neuro: nonfocal MSK: no peripheral joint swelling/tenderness/warmth; back spines nontender    Lab Results Lab Results  Component Value Date   WBC 4.2 10/17/2020   HGB 12.9 (L) 10/17/2020   HCT 37.6 (L) 10/17/2020   MCV 98.7 10/17/2020   PLT 302 10/17/2020    Lab Results  Component Value Date   CREATININE 0.82 10/17/2020   BUN 11 10/17/2020   NA 125 (L) 10/17/2020   K 3.8 10/17/2020   CL 94 (L) 10/17/2020   CO2 22 10/17/2020    Lab Results  Component Value Date   ALT 26 10/17/2020   AST 71 (H) 10/17/2020   ALKPHOS 109 10/17/2020   BILITOT 1.4 (H) 10/17/2020      Microbiology: Recent Results (from the past 240 hour(s))  Resp Panel by RT-PCR (Flu A&B, Covid) Nasopharyngeal Swab     Status: Abnormal   Collection Time: 10/12/20 11:34 AM   Specimen: Nasopharyngeal Swab; Nasopharyngeal(NP) swabs in vial transport medium  Result Value Ref Range Status   SARS Coronavirus 2 by RT PCR POSITIVE (A) NEGATIVE Final    Comment: RESULT CALLED TO, READ BACK BY AND VERIFIED WITH: Edwyna Shell RN 1408 10/12/20 A BROWNING (NOTE) SARS-CoV-2 target nucleic acids are DETECTED.  The SARS-CoV-2 RNA is generally detectable in upper respiratory specimens during the acute phase of infection. Positive results are indicative of the presence of the identified virus, but do not rule out bacterial infection or co-infection with other pathogens not detected by the test. Clinical correlation with patient history and other  diagnostic information is necessary to determine patient infection status. The expected result is Negative.  Fact Sheet for Patients: EntrepreneurPulse.com.au  Fact Sheet for Healthcare Providers: IncredibleEmployment.be  This test is not yet approved or cleared by the [place] FDA and  has been authorized for detection and/or diagnosis of SARS-CoV-2 by FDA under an Emergency Use Authorization (EUA).  This EUA will remain in effect (meaning this test ca n be used) for the duration of  the COVID-19 declaration under Section 564(b)(1) of the Act, 21 U.S.C. section 360bbb-3(b)(1), unless the authorization is terminated or revoked sooner.     Influenza A by PCR NEGATIVE NEGATIVE Final   Influenza B by PCR NEGATIVE NEGATIVE Final    Comment: (NOTE) The Xpert Xpress SARS-CoV-2/FLU/RSV plus assay is intended as an aid in the diagnosis of influenza from Nasopharyngeal swab specimens and should not be used as a sole basis for treatment. Nasal washings and aspirates are unacceptable for Xpert Xpress SARS-CoV-2/FLU/RSV testing.  Fact Sheet for Patients:  EntrepreneurPulse.com.au  Fact Sheet for Healthcare Providers: IncredibleEmployment.be  This test is not yet approved or cleared by the [place] FDA and has been authorized for detection and/or diagnosis of SARS-CoV-2 by FDA under an Emergency Use Authorization (EUA). This EUA will remain in effect (meaning this test can be used) for the duration of the COVID-19 declaration under Section 564(b)(1) of the Act, 21 U.S.C. section 360bbb-3(b)(1), unless the authorization is terminated or revoked.  Performed at [facility], [place] [address] [place], [place] [address]   Blood culture (routine x 2)     Status: None   Collection Time: 10/12/20  2:45 PM   Specimen: BLOOD LEFT FOREARM  Result Value Ref Range Status   Specimen Description BLOOD LEFT FOREARM   Final   Special Requests   Final    BOTTLES DRAWN AEROBIC AND ANAEROBIC Blood Culture adequate volume   Culture   Final    NO GROWTH 5 DAYS Performed at [facility], [place] [address], [place], [place] [address]    Report Status 10/17/2020 FINAL  Final  Blood culture (routine x 2)     Status: None   Collection Time: 10/12/20  5:10 PM   Specimen: BLOOD RIGHT WRIST  Result Value Ref Range Status   Specimen Description BLOOD RIGHT WRIST  Final   Special Requests   Final    BOTTLES DRAWN AEROBIC AND ANAEROBIC Blood Culture adequate volume   Culture   Final    NO GROWTH 5 DAYS Performed at [facility], [place] [address], [place], [place] [address]    Report Status 10/17/2020 FINAL  Final  CSF culture w Stat Gram Stain     Status: None (Preliminary result)   Collection Time: 10/15/20  4:21 PM   Specimen: CSF; Cerebrospinal Fluid  Result Value Ref Range Status   Specimen Description CSF  Final   Special Requests NONE  Final   Gram Stain   Final    WBC PRESENT, PREDOMINANTLY MONONUCLEAR NO ORGANISMS SEEN CYTOSPIN SMEAR    Culture   Final    NO GROWTH 2 DAYS Performed at [facility], [place] [address], [place], [place] [address]    Report Status PENDING  Incomplete     Serology:   Imaging: If present, new imagings (plain films, ct scans, and mri) have been personally visualized and interpreted; radiology reports have been reviewed. Decision making incorporated into the Impression / Recommendations  9/10 mri brain 1. No evidence of acute intracranial abnormality on this motion limited study. 2. Similar trace bilateral anterior frontal convexity subdural hematomas. 3. Paranasal sinus disease with air-fluid level in the left maxillary sinus.   9/09 cxr 1. Persistent cystic and nodular opacities within the left upper lobe. As described on previous CT, these findings are favored to represent sequelae of atypical infection. 2. No acute superimposed airspace  consolidation.  Jabier Mutton, [facility] for Infectious [place] [phone] pager    10/17/2020, 11:37 AM

## 2020-10-17 NOTE — Progress Notes (Signed)
Per Dr. Einar Gip, no further orders at this time. Continue to monitor.  Gladys Damme, MD [facility], PGY-3

## 2020-10-17 NOTE — TOC CAGE-AID Note (Addendum)
Transition of Care [facility]) - CAGE-AID Screening   Patient Details  Name: Ricky [name] MRN: [id number] Date of Birth: May 05, 1955  Transition of Care [facility]) CM/SW Contact:    Joanne Chars, LCSW Phone Number: 10/17/2020, 9:28 AM   Clinical Narrative: CSW spoke with pt over the phone due to covid + for Cage aid assessment.  Pt reports he is currently drinking on a daily basis, 2-3 40 oz beers per day.  This has been going on since 2019.  Pt also reports cocaine use 2-3x per week, usually about a gram.  Pt UDS also positive for THC but pt reports he only does this sporadically.  Pt was in residential treatment in [place] in 2019 and stayed sober until his relapse in 2019.  Pt would like to reenter residential treatment now.  Pt remains in quarantive for covid.  CSW called [facility] to check on options once he is out of quarantine.    1345: CSW has called [facility]x, unable to speak with anyone.  CSW discussed follow up with pt after his quarantine period ends and provided contact information.      CAGE-AID Screening:    Have You Ever Felt You Ought to Cut Down on Your Drinking or Drug Use?: Yes Have People Annoyed You By Critizing Your Drinking Or Drug Use?: Yes Have You Felt Bad Or Guilty About Your Drinking Or Drug Use?: Yes Have You Ever Had a Drink or Used Drugs First Thing In The Morning to Steady Your Nerves or to Get Rid of a Hangover?: No CAGE-AID Score: 3  Substance Abuse Education Offered: Yes  Substance abuse interventions: Patient Counseling, Other (must comment) Civil engineer, contracting)

## 2020-10-17 NOTE — Progress Notes (Signed)
Subjective:  Headache is better and states he only had one episode of palpitations last evening  Intake/Output from previous day:  I/O last 3 completed shifts: In: 761.1 [P.O.:360; IV Piggyback:401.1] Out: -  No intake/output data recorded.  Blood pressure (!) 135/95, pulse 69, temperature 98.3 F (36.8 C), temperature source Oral, resp. rate 20, height 6' 1" (1.854 m), weight 77.1 kg, SpO2 99 %. Physical Exam Constitutional:      Appearance: He is not ill-appearing.  Neck:     Vascular: No carotid bruit or JVD.  Cardiovascular:     Rate and Rhythm: Normal rate and regular rhythm.     Pulses: Intact distal pulses.     Heart sounds: Normal heart sounds. No murmur heard.   No gallop.  Pulmonary:     Effort: Pulmonary effort is normal.     Breath sounds: Normal breath sounds.  Abdominal:     General: Bowel sounds are normal.     Palpations: Abdomen is soft.  Musculoskeletal:        General: No swelling.  Skin:    General: Skin is warm.     Capillary Refill: Capillary refill takes less than 2 seconds.  Neurological:     General: No focal deficit present.     Mental Status: He is alert.  Psychiatric:        Mood and Affect: Mood normal.    Lab Results: BMP BNP (last 3 results) Recent Labs    04/20/20 0357 04/21/20 0130 04/22/20 0323  BNP 54.3 113.7* 39.3    ProBNP (last 3 results) No results for input(s): PROBNP in the last 8760 hours. BMP Latest Ref Rng & Units 10/17/2020 10/16/2020 10/15/2020  Glucose 70 - 99 mg/dL 172(H) 116(H) 110(H)  BUN 8 - 23 mg/dL _0 Creatinine 0.61 - 1.24 mg/dL 0.82 0.94 0.91  Sodium 135 - 145 mmol/L 125(L) 127(L) 127(L)  Potassium 3.5 - 5.1 mmol/L 3.8 3.9 3.7  Chloride 98 - 111 mmol/L 94(L) 94(L) 98  CO2 22 - 32 mmol/L _1 Calcium 8.9 - 10.3 mg/dL 8.7(L) 8.8(L) 8.5(L)   Hepatic Function Latest Ref Rng & Units 10/17/2020 10/16/2020 10/15/2020  Total Protein 6.5 - 8.1 g/dL 7.4 8.2(H) 7.4  Albumin 3.5 - 5.0 g/dL 2.2(L)  2.4(L) 2.2(L)  AST 15 - 41 U/L 71(H) 81(H) 59(H)  ALT 0 - 44 U/L _2 Alk Phosphatase 38 - 126 U/L 109 107 104  Total Bilirubin 0.3 - 1.2 mg/dL 1.4(H) 1.7(H) 1.4(H)  Bilirubin, Direct 0.0 - 0.2 mg/dL - - -   CBC Latest Ref Rng & Units 10/17/2020 10/16/2020 10/15/2020  WBC 4.0 - 10.5 K/uL 4.2 5.1 4.4  Hemoglobin 13.0 - 17.0 g/dL 12.9(L) 14.6 13.4  Hematocrit 39.0 - 52.0 % 37.6(L) 41.7 38.6(L)  Platelets 150 - 400 K/uL 302 303 249   Lipid Panel     Component Value Date/Time   CHOL 141 03/29/2020 0255   TRIG 118 03/29/2020 0255   HDL 31 (L) 03/29/2020 0255   CHOLHDL 4.5 03/29/2020 0255   VLDL 24 03/29/2020 0255   LDLCALC 86 03/29/2020 0255   Cardiac Panel (last 3 results) No results for input(s): CKTOTAL, CKMB, TROPONINI, RELINDX in the last 72 hours.  HEMOGLOBIN A1C No results found for: HGBA1C, MPG TSH Recent Labs    03/27/20 0949  TSH 0.628   Cardiac Studies:    Echocardiogram 03/28/2020:  1. Left ventricular ejection fraction, by estimation, is 50 to 55%. The  left ventricle has low normal function. The left ventricle has no regional  wall motion abnormalities. There is mild concentric left ventricular  hypertrophy. Left ventricular  diastolic parameters are indeterminate.   2. Right ventricular systolic function is normal. The right ventricular  size is normal.   3. Left atrial size was mildly dilated.   4. Right atrial size was mildly dilated.   5. The mitral valve is normal in structure. Trivial mitral valve  regurgitation. No evidence of mitral stenosis.   6. The aortic valve is tricuspid. Aortic valve regurgitation is not  visualized. No aortic stenosis is present.   7. The inferior vena cava is normal in size with greater than 50%  respiratory variability, suggesting right atrial pressure of 3 mmHg.   Lexiscan Nuclear stress test 03/29/2020: Normal pharmacologic nuclear stress test with no evidence for prior infarct or ischemia. LVEF 51%.   SVT  ablation aborted 04/23/2020: 1. Sinus rhythm upon presentation.  2. The patient had a concealed right para-hisian AP and catheter manipulation around the pathway resulted in bumping the pathway and rendered the pathway non-functional such that additional mapping and ablation could not be carried out. 3. No inducible arrhythmias following catheter manipulation.  5. No early apparent complications.    EKG:   EKG 10/12/2020: Normal sinus rhythm at rate of 99 bpm, normal axis, nonspecific ST changes.   Telemetry: Recurrent episodes of PSVT Yesterday and today,  10/17/2020. Two episodes today   Scheduled Meds:  dextromethorphan-guaiFENesin  1 tablet Oral BID   digoxin  0.25 mg Oral Daily   feeding supplement  237 mL Oral TID BM   fluconazole  800 mg Oral Daily   fluticasone  1 spray Each Nare Daily   folic acid  1 mg Oral Daily   multivitamin with minerals  1 tablet Oral Daily   sulfamethoxazole-trimethoprim  1 tablet Oral Daily   verapamil  180 mg Oral Daily   Continuous Infusions: PRN Meds:.acetaminophen  Assessment  Ricky [name] is a 65 y.o.AAM patient malewith h/o polysubstance use (onging), HIV, noncompliance, cigarette smoking, recent COVID-19 infection (January 2022), hypertension, protein calorie malnutrition, aortic atherosclerosis, cavitary consolidation in the left lung fields on recent CT imaging, emphysema. Now admitted with cough and fatigue and presumed Covid re infection (ruled out). 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  He now has recurrence of SVT and I was consulted for management.   1.  Recurrent SVT, aborted AVNRT ablation on 04/23/2020 as patient developed nonfunctioning pathway after physical manipulation. 2.  Polysubstance use 3. Primary hypertension   Recommendations:    Patient has extreme high risk for development of complete AV node blockade if he has repeat attempt at AVNRT ablation.  Discussed curbside with Dr. Greg Taylor who is in agreement at  this time, unless SVT becomes incessant, medical therapy.  Continue to digoxin, added Verapamil SR both for hypertension and SVT.    Otherwise from cardiac standpoint is without any clinical evidence of heart failure, no angina pectoris, We will continue to follow sidelines.   Jay Ganji, MD, FACC 10/17/2020, 8:34 AM Office: [phone] Fax: [phone] Pager: [phone]   

## 2020-10-18 DIAGNOSIS — J189 Pneumonia, unspecified organism: Secondary | ICD-10-CM | POA: Diagnosis not present

## 2020-10-18 DIAGNOSIS — F1010 Alcohol abuse, uncomplicated: Secondary | ICD-10-CM | POA: Diagnosis not present

## 2020-10-18 DIAGNOSIS — U071 COVID-19: Secondary | ICD-10-CM | POA: Diagnosis not present

## 2020-10-18 DIAGNOSIS — B20 Human immunodeficiency virus [HIV] disease: Secondary | ICD-10-CM | POA: Diagnosis not present

## 2020-10-18 LAB — CBC
HCT: 39.7 % (ref 39.0–52.0)
Hemoglobin: 13.3 g/dL (ref 13.0–17.0)
MCH: 34.1 pg — ABNORMAL HIGH (ref 26.0–34.0)
MCHC: 33.5 g/dL (ref 30.0–36.0)
MCV: 101.8 fL — ABNORMAL HIGH (ref 80.0–100.0)
Platelets: 356 10*3/uL (ref 150–400)
RBC: 3.9 MIL/uL — ABNORMAL LOW (ref 4.22–5.81)
RDW: 11.7 % (ref 11.5–15.5)
WBC: 2.7 10*3/uL — ABNORMAL LOW (ref 4.0–10.5)
nRBC: 0 % (ref 0.0–0.2)

## 2020-10-18 LAB — COMPREHENSIVE METABOLIC PANEL
ALT: 24 U/L (ref 0–44)
AST: 66 U/L — ABNORMAL HIGH (ref 15–41)
Albumin: 2.3 g/dL — ABNORMAL LOW (ref 3.5–5.0)
Alkaline Phosphatase: 126 U/L (ref 38–126)
Anion gap: 7 (ref 5–15)
BUN: 14 mg/dL (ref 8–23)
CO2: 26 mmol/L (ref 22–32)
Calcium: 9.2 mg/dL (ref 8.9–10.3)
Chloride: 98 mmol/L (ref 98–111)
Creatinine, Ser: 0.97 mg/dL (ref 0.61–1.24)
GFR, Estimated: >60 mL/min (ref >60)
Glucose, Bld: 167 mg/dL — ABNORMAL HIGH (ref 70–99)
Potassium: 4 mmol/L (ref 3.5–5.1)
Sodium: 131 mmol/L — ABNORMAL LOW (ref 135–145)
Total Bilirubin: 0.8 mg/dL (ref 0.3–1.2)
Total Protein: 7.9 g/dL (ref 6.5–8.1)

## 2020-10-18 LAB — CSF CULTURE W GRAM STAIN: Culture: NO GROWTH

## 2020-10-18 LAB — DIGOXIN LEVEL: Digoxin Level: 0.5 ng/mL — ABNORMAL LOW (ref 0.8–2.0)

## 2020-10-18 LAB — BLASTOMYCES ANTIGEN: Blastomyces Antigen: NOT DETECTED ng/mL

## 2020-10-18 MED ORDER — VERAPAMIL HCL ER 120 MG PO TBCR
120.0000 mg | EXTENDED_RELEASE_TABLET | Freq: Every day | ORAL | Status: DC
  Administered 2020-10-18 – 2020-10-26 (×9): 120 mg via ORAL
  Filled 2020-10-18 (×9): qty 1

## 2020-10-18 NOTE — Progress Notes (Signed)
Telemetry reviewed. No further episodes of SVT since 9/14 morning. Overnight junctional rhythm in 50s at 2:33 AM not of concern, likely physiological. Continue verapamil 120 mg and digoxin 0.25 mg daily. Patient is not a candidate for any further ablation attempts. Given his compliance concern, also would not recommend antiarrhythmic therapy.  Cardiology will sign off. Please call back with any questions.   Nigel Mormon, MD Pager: [phone] Office: [phone]

## 2020-10-18 NOTE — Plan of Care (Signed)
  Problem: Education: Goal: Knowledge of General Education information will improve Description: Including pain rating scale, medication(s)/side effects and non-pharmacologic comfort measures Outcome: Not Progressing   Problem: Health Behavior/Discharge Planning: Goal: Ability to manage health-related needs will improve Outcome: Not Progressing   Problem: Clinical Measurements: Goal: Ability to maintain clinical measurements within normal limits will improve Outcome: Not Progressing Goal: Will remain free from infection Outcome: Not Progressing Goal: Diagnostic test results will improve Outcome: Not Progressing Goal: Respiratory complications will improve Outcome: Not Progressing Goal: Cardiovascular complication will be avoided Outcome: Not Progressing   Problem: Activity: Goal: Risk for activity intolerance will decrease Outcome: Not Progressing   Problem: Nutrition: Goal: Adequate nutrition will be maintained Outcome: Not Progressing   Problem: Coping: Goal: Level of anxiety will decrease Outcome: Not Progressing   Problem: Pain Managment: Goal: General experience of comfort will improve Outcome: Not Progressing   Problem: Safety: Goal: Ability to remain free from injury will improve Outcome: Not Progressing   Problem: Skin Integrity: Goal: Risk for impaired skin integrity will decrease Outcome: Not Progressing   

## 2020-10-18 NOTE — Progress Notes (Signed)
FPTS Interim Progress Note  S: No concerns or complaints  O: BP 113/82 (BP Location: Left Arm)   Pulse 64   Temp 98 F (36.7 C) (Oral)   Resp 16   Ht '6\' 1"'$  (1.854 m)   Wt 77.1 kg   SpO2 100%   BMI 22.43 kg/m   General: NAD, pleasant, able to participate in exam Respiratory: No respiratory distress Skin: warm and dry, no rashes noted Psych: Normal affect and mood   A/P:  Went to evaluate him at the start of nighttime shift to see if he was in need of anything. Patient with no concerns or complaints at this time.  Remainder per daytime progress note.  Lurline Del, DO 10/18/2020, 9:03 PM PGY-3, [place] Medicine Service pager [phone]

## 2020-10-18 NOTE — TOC Progression Note (Addendum)
Transition of Care [facility]) - Progression Note    Patient Details  Name: SWAIN EMRICH MRN: [id number] Date of Birth: 06-07-1955  Transition of Care [facility]) CM/SW Contact  Joanne Chars, LCSW Phone Number: 10/18/2020, 10:46 AM  Clinical Narrative:   CSW spoke with Sharyn Lull at [facility] residential.  Pt's medicaid is from [facility], which means pt cannot receive treatment in [place].  She directed CSW to [employer] at [place] [phone], [phone]) for help locating a residential treatment facility.  CSW spoke with [name] Patient who requested referral be faxed to [phone].  CSW updated pt, pt gives permission for CSW to fax referral to [facility] and pursue residential treatment.    1525: CSW spoke with [name] at [facility].  She requested that CSW call back the day before pt is set to DC.  Some concerns with pt medical issues but pending improvement, they may be able to work with him.          Expected Discharge Plan and Services                                                 Social Determinants of Health (SDOH) Interventions    Readmission Risk Interventions Readmission Risk Prevention Plan 04/25/2020  Transportation Screening Complete  PCP or Specialist Appt within 3-5 Days Complete  HRI or [place] Complete  Social Work Consult for [place] Planning/Counseling Complete  Palliative Care Screening Not Applicable  Medication Review Press photographer) Complete  Some recent data might be hidden

## 2020-10-18 NOTE — Progress Notes (Signed)
Family Medicine Teaching Service Daily Progress Note Intern Pager: [phone]  Patient name: Ricky [name] Medical record number: [id number] Date of birth: 1956/02/01 Age: 65 y.o. Gender: male  Primary Care Provider: Pcp, No Consultants: ID, Neurology, cardiology (signed off) Code Status: Full code  Pt Overview and Major Events to Date:  9/9 Admitted 9/10 cefepime d/c'd, MRI showed no acute abnormalities, history of subdural hematomas, left maxillary sinus air-fluid level 9/11-refused LP, started on fluconazole and atovaquone 9/12 LP performed 9/13 suspected spinal headache, switch to Bactrim for PJP coverage 9/14 blood patch administered Assessment and Plan:  65 year old male who presented with cough fever and chest pain with past medical history of HIV AIDS, MAC, SVTs with unsuccessful past ablation, CKD, hypertension, hyperlipidemia, protein calorie malnutrition  Headache, COVID-19, HIV/AIDS with immunocompromised state Headache this morning is improved, still feels it behind his eyes sometimes. Received blood patch yesterday. Some dizziness still present when standing up, encouraged hydration.  -ID consulted, appreciate assistance.  Completed remdesivir course yesterday.  Bactrim for PJP coverage, fluconazole, urine histo/blasto pending -Neurology consulted, LP performed 3 days ago Mucinex every 12 hours, Flonase -Tylenol 1000 mg every 6 hours. -S/p fentanyl one-time dose -Cardiac and pulse oximetry monitoring -Daily CBC CMP -Airborne and contact precautions -Completed remdesivir course -Incentive spirometry flutter valve -Follow-up CT in 3 months nodular opacities in the left lung  SVT s/p unsuccessful ablation  Telemetry showed tachy to 90s but no SVT overnight. He still felt palpitations last night but no chest pain. Had sustained SVT for 6 minutes yesterday morning and was continued on digoxin and verapamil per cardiology. Digoxin level of 0.5, recheck before  leaving. -Cardiac monitoring -K this morning is 4.0  Sinus Bradycardia Multiple episodes of bradycardia to 50s overnight and this AM. Has dizziness but no peripheral edema. 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  LP was performed and CSF cryptococcus antigen was 28. -Follow-up fungal cultures, no growth less than 24 hours.  Check in 2 weeks. -Fluconazole per ID  Chronic hyponatremia Sodium on admission was 126, today was 131.  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 -A.m. CMP   Elevated LFTs AST/ALT was 66/24.  Likely related to alcohol use.  Some pain in abdominal pain when standing and coughing but otherwise has been improving -Monitor on CMP  Polysubstance abuse CIWA scores have been 0, 0. 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  Chest x-ray and CTA showed left upper lobe infiltration. -ID consulted and following, not currently treating due to patient compliance and less important medically  Macrocytosis without anemia MCV 101.8 hemoglobin 13.3  FEN/GI: Regular PPx: Lovenox Dispo: Rehab pending quarantine/isolation status  Subjective:  Patient says headache improving, has had some dizziness while standing, wanting to go to facility after this but unsure of his quarantine status.  Objective: Temp:  [97.4 F (36.3 C)-99.2  F (37.3 C)] 97.4 F (36.3 C) (09/15 0359) Pulse Rate:  [51-64] 51 (09/15 0359) Resp:  [15-20] 15 (09/15  0356) BP: (98-122)/(72-92) 98/72 (09/15 0359) SpO2:  [97 %-99 %] 98 % (09/15 0359) Physical Exam: General: NAD Cardiovascular: RRR no m/r/g, pulse 2+ bilaterally Respiratory: CTAB Abdomen: Nondistended Extremities: Moves freely  Laboratory: Recent Labs  Lab 10/15/20 0220 10/16/20 0758 10/17/20 0215  WBC 4.4 5.1 4.2  HGB 13.4 14.6 12.9*  HCT 38.6* 41.7 37.6*  PLT 249 303 302   Recent Labs  Lab 10/15/20 0220 10/16/20 0758 10/17/20 0215  NA 127* 127* 125*  K 3.7 3.9 3.8  CL 98 94* 94*  CO2 _0 BUN _1 CREATININE 0.91 0.94 0.82  CALCIUM 8.5* 8.8* 8.7*  PROT 7.4 8.2* 7.4  BILITOT 1.4* 1.7* 1.4*  ALKPHOS 104 107 109  ALT _2 AST 59* 81* 71*  GLUCOSE 110* 116* 172*      Imaging/Diagnostic Tests:   Gerrit Heck, MD 10/18/2020, 6:25 AM PGY-1, [place] Intern pager: [phone], text pages welcome

## 2020-10-19 DIAGNOSIS — J189 Pneumonia, unspecified organism: Secondary | ICD-10-CM | POA: Diagnosis not present

## 2020-10-19 DIAGNOSIS — B20 Human immunodeficiency virus [HIV] disease: Secondary | ICD-10-CM | POA: Diagnosis not present

## 2020-10-19 DIAGNOSIS — U071 COVID-19: Secondary | ICD-10-CM | POA: Diagnosis not present

## 2020-10-19 DIAGNOSIS — F1010 Alcohol abuse, uncomplicated: Secondary | ICD-10-CM | POA: Diagnosis not present

## 2020-10-19 LAB — CBC
HCT: 39.5 % (ref 39.0–52.0)
Hemoglobin: 13.4 g/dL (ref 13.0–17.0)
MCH: 34.4 pg — ABNORMAL HIGH (ref 26.0–34.0)
MCHC: 33.9 g/dL (ref 30.0–36.0)
MCV: 101.3 fL — ABNORMAL HIGH (ref 80.0–100.0)
Platelets: 398 10*3/uL (ref 150–400)
RBC: 3.9 MIL/uL — ABNORMAL LOW (ref 4.22–5.81)
RDW: 11.7 % (ref 11.5–15.5)
WBC: 2.9 10*3/uL — ABNORMAL LOW (ref 4.0–10.5)
nRBC: 0 % (ref 0.0–0.2)

## 2020-10-19 LAB — BASIC METABOLIC PANEL
Anion gap: 8 (ref 5–15)
BUN: 15 mg/dL (ref 8–23)
CO2: 23 mmol/L (ref 22–32)
Calcium: 9.5 mg/dL (ref 8.9–10.3)
Chloride: 98 mmol/L (ref 98–111)
Creatinine, Ser: 0.93 mg/dL (ref 0.61–1.24)
GFR, Estimated: >60 mL/min (ref >60)
Glucose, Bld: 135 mg/dL — ABNORMAL HIGH (ref 70–99)
Potassium: 4.6 mmol/L (ref 3.5–5.1)
Sodium: 129 mmol/L — ABNORMAL LOW (ref 135–145)

## 2020-10-19 LAB — HEPATIC FUNCTION PANEL
ALT: 31 U/L (ref 0–44)
AST: 91 U/L — ABNORMAL HIGH (ref 15–41)
Albumin: 2.5 g/dL — ABNORMAL LOW (ref 3.5–5.0)
Alkaline Phosphatase: 138 U/L — ABNORMAL HIGH (ref 38–126)
Bilirubin, Direct: 0.3 mg/dL — ABNORMAL HIGH (ref 0.0–0.2)
Indirect Bilirubin: 0.7 mg/dL (ref 0.3–0.9)
Total Bilirubin: 1 mg/dL (ref 0.3–1.2)
Total Protein: 8.1 g/dL (ref 6.5–8.1)

## 2020-10-19 LAB — SARS CORONAVIRUS 2 (TAT 6-24 HRS): SARS Coronavirus 2: POSITIVE — AB

## 2020-10-19 NOTE — Progress Notes (Signed)
Id brief note   [facility]  Incidental covid 9/09 with ct 22's Planning on removing covid isolation at 10 days from that date if repeat covid testing now with cycle threshold > 32  9/12 csf cx remains negative to date From aids/fungal cryptococcosis, no change in plan from previous note

## 2020-10-19 NOTE — Progress Notes (Signed)
FPTS Interim Progress Note  S: Patient is a 65 year old HIV-positive male presented originally with fever/chest pain and was found to be COVID-19 positive.  Patient experienced SVT during his hospitalization is currently on verapamil.  On assessment patient is in no acute distress, voices no complaints.  He denies shortness of breath.  Heart rate with regular rate and rhythm.  O: BP 107/86 (BP Location: Left Arm)   Pulse (!) 54   Temp 98.1 F (36.7 C) (Oral)   Resp 20   Ht '6\' 1"'$  (1.854 m)   Wt 170 lb (77.1 kg)   SpO2 100%   BMI 22.43 kg/m   General: elderly male lying in bed, NAD Resp: no increased work of breathing CV: RRR, strong peripheral pulse  A/P: No evidence of SVT recurrence.  We will call [place] cards if this does occur.  Patient is currently awaiting end of 10-day quarantine before discharge.  He will be going to rehab for alcohol use disorder. -Remainder of plan per day team  Corky Sox PGY-1, Psychiatry Service pager [phone]

## 2020-10-19 NOTE — Plan of Care (Signed)

## 2020-10-19 NOTE — Progress Notes (Signed)
Family Medicine Teaching Service Daily Progress Note Intern Pager: [phone]  Patient name: Ricky [name] Medical record number: [id number] Date of birth: 1955/05/10 Age: 65 y.o. Gender: male  Primary Care Provider: Pcp, No Consultants: Cardiology (signed off) Neurology (signed off) ID Code Status: Full  Pt Overview and Major Events to Date:  9/9 admitted 9/10 cefepime d/c'd MRI no acute abnormalities, chronic subdural hematomas, left maxillary sinus fluid level 9/11 refused LP started onf fluconazole and atovaquone 9/12 LP performed 9/13 suspected spinal HA, switch to bactrim for pjp coverage 9/14 blood patch administered  Assessment and Plan:  65 yo male presenting with cough, fever, chest pain and PMH of HIV Aids, MAC, SVT s/p unsuccessful ablation, CKD, HTN, HLD, protein calorie malnutrition  Headache, Covid-19, HIV/Aids with Immunocompromised State Headache this AM is resolved. Blood patch 2 days ago. No complaints this AM. -ID consulted, appreciate recs- Bactrim for PJP coverage, fluconazole, urine histo ag negative, recommended a 10 day quarantine period until 9/19 -Mucinex q12h, flonase -tylenol 1000 mg q6h -cardiac and pulse ox monitoring -Daily, CBC and CMP -Airborne and contact precaustions -completed remdesivir course -Incentive spirometry flutter valve -f/u CT in 3 months nodular opacities in Left lung  SVT s/p unsuccessful ablation Telemetry showed no SVT, HR 50s during night while sleeping. No palpitations or chest pain.  -verapamil -cardiac monitoring -K is 4.6  Sinus Bradycardia Not symptomatic -verapamil continued  Hx of cryptococcal meningitis March 2022, LP performed and CSF cryptococcus Ag was 28 -F/u fungal cx, NGTD, check in 2 weeks -fluconazole continued  Chronic hyponatremia Sodium was 129, slightly decreased from 131. 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 Likely alcohol related -Monitor on CMP  Polysubtance abuse CIWA have been 0,0 -TOC Consult for [place] resources -Folate -MV with minerals  Moderate protein calorie malnutrition Patient had chronic calific pancreatitis and malnutrition -RD recommendations -Ensure -MV with minerals  Hypertension BPs have been 125/80s -conitnue monitoring -verapamil   MAC March 2020 diagnose and did not complete medication course. CXR and CTA showed LUL infiltration. Likely colonization -ID not treating currently  Macrocytosis without anemia MCV 101.3, Hgb 13.4; giving folate  FEN/GI: Regular PPx: Lovenox Dispo:Rehab after 10 day quarantine period 9/19  Subjective:  No complaints this AM.   Objective: Temp:  [98 F (36.7 C)] 98 F (36.7 C) (09/15 2100) Pulse Rate:  [53-64] 53 (09/15 2100) Resp:  [16] 16 (09/15 1900) BP: (105-135)/(77-114) 135/114 (09/15 2100) SpO2:  [99 %-100 %] 100 % (09/15 1900) Physical Exam: General: NAD, laying Cardiovascular: RRR no m/r/g Respiratory: CTAB Abdomen: nondistended nontender Extremities: moves freely, no LE edema  Laboratory: Recent Labs  Lab 10/17/20 0215 10/18/20 0858 10/19/20 0221  WBC 4.2 2.7* 2.9*  HGB 12.9* 13.3 13.4  HCT 37.6* 39.7 39.5  PLT 302 356 398   Recent Labs  Lab 10/17/20 0215 10/18/20 0858 10/19/20 0221  NA 125* 131* 129*  K 3.8 4.0 4.6  CL 94* 98 98  CO2 _0 BUN _1 CREATININE 0.82 0.97 0.93  CALCIUM 8.7* 9.2 9.5  PROT 7.4 7.9 8.1  BILITOT 1.4* 0.8 1.0  ALKPHOS 109 126 138*  ALT _2 AST 71* 66* 91*  GLUCOSE 172* 167* 135*     Imaging/Diagnostic Tests:   Gerrit Heck, MD 10/19/2020, 4:51 AM PGY-1, [employer] Intern pager: [phone], text pages welcome

## 2020-10-19 NOTE — TOC Progression Note (Signed)
Transition of Care [facility]) - Progression Note    Patient Details  Name: Ricky [name] MRN: [id number] Date of Birth: [birth year]  [facility]) CM/SW Contact  Joanne Chars, LCSW Phone Number: 10/19/2020, 3:23 PM  Clinical Narrative:   CSW spoke with MD who is planning to keep pt inpt until end of covid quarantine, which would end Monday, 9/19.  CSW LM with Cynda Acres at [facility] with this time frame, asked her to look for potential bed for Monday.           Expected Discharge Plan and Services                                                 Social Determinants of Health (SDOH) Interventions    Readmission Risk Interventions Readmission Risk Prevention Plan 04/25/2020  Transportation Screening Complete  PCP or Specialist Appt within 3-5 Days Complete  HRI or [place] Complete  Social Work Consult for [place] Planning/Counseling Complete  Palliative Care Screening Not Applicable  Medication Review Press photographer) Complete  Some recent data might be hidden

## 2020-10-20 DIAGNOSIS — F1010 Alcohol abuse, uncomplicated: Secondary | ICD-10-CM | POA: Diagnosis not present

## 2020-10-20 DIAGNOSIS — M545 Low back pain, unspecified: Secondary | ICD-10-CM

## 2020-10-20 DIAGNOSIS — G8929 Other chronic pain: Secondary | ICD-10-CM

## 2020-10-20 DIAGNOSIS — B20 Human immunodeficiency virus [HIV] disease: Secondary | ICD-10-CM | POA: Diagnosis not present

## 2020-10-20 DIAGNOSIS — U071 COVID-19: Secondary | ICD-10-CM | POA: Diagnosis not present

## 2020-10-20 LAB — HEPATIC FUNCTION PANEL
ALT: 32 U/L (ref 0–44)
AST: 89 U/L — ABNORMAL HIGH (ref 15–41)
Albumin: 2.5 g/dL — ABNORMAL LOW (ref 3.5–5.0)
Alkaline Phosphatase: 147 U/L — ABNORMAL HIGH (ref 38–126)
Bilirubin, Direct: 0.3 mg/dL — ABNORMAL HIGH (ref 0.0–0.2)
Indirect Bilirubin: 0.9 mg/dL (ref 0.3–0.9)
Total Bilirubin: 1.2 mg/dL (ref 0.3–1.2)
Total Protein: 7.8 g/dL (ref 6.5–8.1)

## 2020-10-20 LAB — HSV 1/2 PCR, CSF
HSV-1 DNA: NEGATIVE
HSV-2 DNA: NEGATIVE

## 2020-10-20 LAB — BASIC METABOLIC PANEL
Anion gap: 12 (ref 5–15)
BUN: 15 mg/dL (ref 8–23)
CO2: 20 mmol/L — ABNORMAL LOW (ref 22–32)
Calcium: 9.8 mg/dL (ref 8.9–10.3)
Chloride: 94 mmol/L — ABNORMAL LOW (ref 98–111)
Creatinine, Ser: 0.92 mg/dL (ref 0.61–1.24)
GFR, Estimated: >60 mL/min (ref >60)
Glucose, Bld: 138 mg/dL — ABNORMAL HIGH (ref 70–99)
Potassium: 4.9 mmol/L (ref 3.5–5.1)
Sodium: 126 mmol/L — ABNORMAL LOW (ref 135–145)

## 2020-10-20 LAB — CBC
HCT: 37.1 % — ABNORMAL LOW (ref 39.0–52.0)
Hemoglobin: 12.5 g/dL — ABNORMAL LOW (ref 13.0–17.0)
MCH: 34 pg (ref 26.0–34.0)
MCHC: 33.7 g/dL (ref 30.0–36.0)
MCV: 100.8 fL — ABNORMAL HIGH (ref 80.0–100.0)
Platelets: 370 10*3/uL (ref 150–400)
RBC: 3.68 MIL/uL — ABNORMAL LOW (ref 4.22–5.81)
RDW: 11.7 % (ref 11.5–15.5)
WBC: 3.6 10*3/uL — ABNORMAL LOW (ref 4.0–10.5)
nRBC: 0 % (ref 0.0–0.2)

## 2020-10-20 LAB — RESP PANEL BY RT-PCR (FLU A&B, COVID) ARPGX2
Influenza A by PCR: NEGATIVE
Influenza B by PCR: NEGATIVE
SARS Coronavirus 2 by RT PCR: POSITIVE — AB

## 2020-10-20 MED ORDER — POLYVINYL ALCOHOL 1.4 % OP SOLN
1.0000 [drp] | OPHTHALMIC | Status: DC | PRN
  Administered 2020-10-20: 1 [drp] via OPHTHALMIC
  Filled 2020-10-20: qty 15

## 2020-10-20 MED ORDER — NAPROXEN 250 MG PO TABS
375.0000 mg | ORAL_TABLET | Freq: Once | ORAL | Status: AC
  Administered 2020-10-20: 375 mg via ORAL
  Filled 2020-10-20: qty 2

## 2020-10-20 NOTE — Plan of Care (Signed)

## 2020-10-20 NOTE — Progress Notes (Signed)
Brief ID note:  Patient's repeat COVID test yesterday remained positive.  Discussed with microbiology lab to determine CT value.  However, this test was run on the [employer] as opposed to [employer] and they are unable to give a comparable CT value.  Recommend repeating COVID PCR on Cepheid platform and will place new order.   Raynelle Highland for Infectious Disease [facility] 10/20/2020, 9:39 AM

## 2020-10-20 NOTE — Progress Notes (Addendum)
Family Medicine Teaching Service Daily Progress Note Intern Pager: [phone]  Patient name: Ricky [name] Medical record number: [id number] Date of birth: 1955/09/23 Age: 65 y.o. Gender: male  Primary Care Provider: Pcp, No Consultants: CARDS (s/o), Neuro (s/o), ID Code Status: Full  Pt Overview and Major Events to Date:  9/9 admitted 9/10 cefepime d/c'd MRI no acute abnormalities, chronic subdural hematomas, left maxillary sinus fluid level 9/11 refused LP started onf fluconazole and atovaquone 9/12 LP performed 9/13 suspected spinal HA, switch to bactrim for pjp coverage 9/14 blood patch administered  Assessment and Plan:  65 yo male presenting with cough, fever, chest pain and PMH of HIV Aids, MAC, SVT s/p unsuccessful ablation, CKD, HTN, HLD, protein calorie malnutrition  Headache (resolved), Covid-19, HIV/Aids with Immunocompromised State  Hx of cryptococcal meningitis Denies headache at this time.  -ID consulted, appreciate recs- Bactrim for PJP coverage, fluconazole, urine histo ag negative, recommended a 10 day quarantine period until 9/19 -F/u fungal cx, NGTD, check in 2 weeks -Mucinex q12h, flonase -tylenol 1000 mg q6h -Airborne and contact precaustions -Incentive spirometry flutter valve -f/u CT in 3 months nodular opacities in Left lung   SVT s/p unsuccessful ablation, sinus bradycardia Telemetry no SVT, HR 50s. No palpitations or chest pain.  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  -Monitor on CMP   Polysubtance abuse Planning for rehab beyond hospitalization  FEN/GI: Regular  PPx: Lovenox Dispo: Alcohol rehab after 10 day quarentine 9/19 .  Subjective:  No concerns at this time. Resting in bed watching TV.   Objective: Temp:  [97.6 F (36.4 C)-98.1 F (36.7 C)] 97.7 F (36.5 C) (09/17 0533) Pulse Rate:  [50-56] 50  (09/17 0533) Resp:  [15-20] 20 (09/16 2039) BP: (107-125)/(79-90) 124/90 (09/17 0533) SpO2:  [98 %-100 %] 98 % (09/17 0533)  Physical Exam:  General: Appears to not feel well, no acute distress. Age appropriate. Cardiac: RRR, normal heart sounds, no murmurs Respiratory: CTAB, normal effort Abdomen: soft, nontender, nondistended Extremities: No edema or cyanosis. Skin: Warm and dry, no rashes noted Neuro: alert and oriented x4  Laboratory: Recent Labs  Lab 10/18/20 0858 10/19/20 0221 10/20/20 0143  WBC 2.7* 2.9* 3.6*  HGB 13.3 13.4 12.5*  HCT 39.7 39.5 37.1*  PLT 356 398 370   Recent Labs  Lab 10/18/20 0858 10/19/20 0221 10/20/20 0143  NA 131* 129* 126*  K 4.0 4.6 4.9  CL 98 98 94*  CO2 26 23 20*  BUN _0 CREATININE 0.97 0.93 0.92  CALCIUM 9.2 9.5 9.8  PROT 7.9 8.1 7.8  BILITOT 0.8 1.0 1.2  ALKPHOS 126 138* 147*  ALT 24 31 32  AST 66* 91* 89*  GLUCOSE 167* 135* 138*    Imaging/Diagnostic Tests: No new images.   Gerlene Fee, DO 10/20/2020, 5:36 AM PGY-3, [place] Intern pager: [phone], text pages welcome

## 2020-10-21 DIAGNOSIS — B20 Human immunodeficiency virus [HIV] disease: Secondary | ICD-10-CM | POA: Diagnosis not present

## 2020-10-21 DIAGNOSIS — B451 Cerebral cryptococcosis: Secondary | ICD-10-CM | POA: Diagnosis not present

## 2020-10-21 DIAGNOSIS — I471 Supraventricular tachycardia: Secondary | ICD-10-CM | POA: Diagnosis not present

## 2020-10-21 LAB — CBC
HCT: 37.8 % — ABNORMAL LOW (ref 39.0–52.0)
Hemoglobin: 12.5 g/dL — ABNORMAL LOW (ref 13.0–17.0)
MCH: 33.6 pg (ref 26.0–34.0)
MCHC: 33.1 g/dL (ref 30.0–36.0)
MCV: 101.6 fL — ABNORMAL HIGH (ref 80.0–100.0)
Platelets: 398 10*3/uL (ref 150–400)
RBC: 3.72 MIL/uL — ABNORMAL LOW (ref 4.22–5.81)
RDW: 11.7 % (ref 11.5–15.5)
WBC: 4.6 10*3/uL (ref 4.0–10.5)
nRBC: 0 % (ref 0.0–0.2)

## 2020-10-21 LAB — BASIC METABOLIC PANEL
Anion gap: 9 (ref 5–15)
Anion gap: 8 (ref 5–15)
BUN: 16 mg/dL (ref 8–23)
BUN: 18 mg/dL (ref 8–23)
CO2: 24 mmol/L (ref 22–32)
CO2: 24 mmol/L (ref 22–32)
Calcium: 9.5 mg/dL (ref 8.9–10.3)
Calcium: 9.8 mg/dL (ref 8.9–10.3)
Chloride: 95 mmol/L — ABNORMAL LOW (ref 98–111)
Chloride: 95 mmol/L — ABNORMAL LOW (ref 98–111)
Creatinine, Ser: 0.96 mg/dL (ref 0.61–1.24)
Creatinine, Ser: 1.11 mg/dL (ref 0.61–1.24)
GFR, Estimated: >60 mL/min (ref >60)
GFR, Estimated: >60 mL/min (ref >60)
Glucose, Bld: 137 mg/dL — ABNORMAL HIGH (ref 70–99)
Glucose, Bld: 139 mg/dL — ABNORMAL HIGH (ref 70–99)
Potassium: 5.3 mmol/L — ABNORMAL HIGH (ref 3.5–5.1)
Potassium: 5.1 mmol/L (ref 3.5–5.1)
Sodium: 128 mmol/L — ABNORMAL LOW (ref 135–145)
Sodium: 127 mmol/L — ABNORMAL LOW (ref 135–145)

## 2020-10-21 LAB — HEPATIC FUNCTION PANEL
ALT: 36 U/L (ref 0–44)
AST: 97 U/L — ABNORMAL HIGH (ref 15–41)
Albumin: 2.6 g/dL — ABNORMAL LOW (ref 3.5–5.0)
Alkaline Phosphatase: 142 U/L — ABNORMAL HIGH (ref 38–126)
Bilirubin, Direct: 0.3 mg/dL — ABNORMAL HIGH (ref 0.0–0.2)
Indirect Bilirubin: 0.6 mg/dL (ref 0.3–0.9)
Total Bilirubin: 0.9 mg/dL (ref 0.3–1.2)
Total Protein: 8.4 g/dL — ABNORMAL HIGH (ref 6.5–8.1)

## 2020-10-21 MED ORDER — SODIUM ZIRCONIUM CYCLOSILICATE 10 G PO PACK: 10.0000 g | PACK | Freq: Once | ORAL | Status: DC

## 2020-10-21 NOTE — Progress Notes (Addendum)
FPTS Brief Progress Note  S: Sitting up on the side of the bed watching TV. No concerns at this time.    O: BP 110/76 (BP Location: Right Arm)   Pulse 87   Temp 97.8 F (36.6 C) (Oral)   Resp (!) 23   Ht '6\' 1"'$  (1.854 m)   Wt 77.1 kg   SpO2 96%   BMI 22.43 kg/m   General: Appears well, no acute distress. Age appropriate. Respiratory:  normal effort   A/P: Covid 19, HIV/AIDS with Immunocompromised State & Hx of cryptococcla meningitis -Paged by pharmacy for K of 5.3 likely increased from bactrim. Last lab at 0100. Will repeat and plan to administer lokelma when available if necessary.   - Orders reviewed. Labs for AM ordered, which was adjusted as needed.   Gerlene Fee, DO 10/21/2020, 9:49 PM PGY-3, [place] [facility] Night Resident  Please page [phone] with questions.

## 2020-10-21 NOTE — Progress Notes (Addendum)
FPTS Interim Progress Note  S:Patient sleeping and resting comfortably.  Rounded with primary RN.  No concerns voiced.  No orders required.  Appreciated nightly round.  O: BP 120/88 (BP Location: Left Arm)   Pulse 68   Temp 97.6 F (36.4 C) (Oral)   Resp 18   Ht '6\' 1"'$  (1.854 m)   Wt 77.1 kg   SpO2 100%   BMI 22.43 kg/m     A/P: No changes to current plan. See daily progress note.    Lyndee Hensen, DO PGY-3, [place] Intern pager [phone]

## 2020-10-21 NOTE — Progress Notes (Signed)
Family Medicine Teaching Service Daily Progress Note Intern Pager: [phone]  Patient name: Ricky [name] Medical record number: [id number] Date of birth: 1955-11-24 Age: 65 y.o. Gender: male  Primary Care Provider: Pcp, No Consultants: cardiology signed off, neurology signed off, ID Code Status: Full  Pt Overview and Major Events to Date:  9/9 admitted 9/10 cefepime d/c'd, MRI no acute abnormalities, chronic subdural hematomas, left maxillary sinus fluid level 9/11 refused LP started on fluconazole and atovaquone 9/12 LP performed 9/13 suspected spinal HA, switch to bactrim for PJP coverage 9/14 blood patch administered  Assessment and Plan:  65 yo male presenting with cough, fever, chest pain and PMH of HIV AIDS, MAC, SVT s/p unsuccessful ablation, CKD, HTN, HLD, protein calorie malnutrition  Headache (resolved), Covid 19, HIV/AIDS with Immunocompromised State & Hx of cryptococcla meningitis Does not have headache this am  -ID consulted, appreciate recs- Bactrim for PJP coverage, fluconazole, repeating COVID PCR on cepheid platform as hologic one was positive -f/u fungal cx, NGT check in 2 weeks -mucinex q12h, flonase -tylenol 1000 mg q6h -airborne and contact precautions -incentive spirometry flutter valve -f/u CT in 3 months for nodular opacities in LL  SVT s/p unsuccessful ablation, sinus bradycardia Telemtry showed no SVT. Some chest pain when coughing, no palpitations -verapamil -cardiac monitoring -K is 5.3  Chronic hyponatremia Sodium 128 likely related HIV/SIADH -monitor  Elevated LFTs AST/ALT 97/36 -Monitor   Polysubstance abuse -Rehab after hospitalization  FEN/GI: Regular PPx: Lovenox Dispo:Rehab pending quarantine/Covid test results 9/19  Subjective:  No complaints this AM  Objective: Temp:  [97.5 F (36.4 C)-97.6 F (36.4 C)] 97.6 F (36.4 C) (09/18 0300) Pulse Rate:  [57-69] 57 (09/18 0300) Resp:  [17-18] 18 (09/18 0300) BP:  (115-120)/(88) 115/88 (09/18 0300) SpO2:  [100 %] 100 % (09/18 0300) Physical Exam: General: NAD, eating Cardiovascular: RRR no m/r/g Respiratory: CTAB Abdomen: Nondistended Extremities: Moves freely, no LE edema  Laboratory: Recent Labs  Lab 10/19/20 0221 10/20/20 0143 10/21/20 0155  WBC 2.9* 3.6* 4.6  HGB 13.4 12.5* 12.5*  HCT 39.5 37.1* 37.8*  PLT 398 370 398   Recent Labs  Lab 10/19/20 0221 10/20/20 0143 10/21/20 0155  NA 129* 126* 128*  K 4.6 4.9 5.3*  CL 98 94* 95*  CO2 23 20* 24  BUN _0 CREATININE 0.93 0.92 0.96  CALCIUM 9.5 9.8 9.5  PROT 8.1 7.8 8.4*  BILITOT 1.0 1.2 0.9  ALKPHOS 138* 147* 142*  ALT 31 32 36  AST 91* 89* 97*  GLUCOSE 135* 138* 137*      Imaging/Diagnostic Tests:   Gerrit Heck, MD 10/21/2020, 6:16 AM PGY-1, [place] Intern pager: [phone], text pages welcome

## 2020-10-22 DIAGNOSIS — B20 Human immunodeficiency virus [HIV] disease: Secondary | ICD-10-CM | POA: Diagnosis not present

## 2020-10-22 DIAGNOSIS — M545 Low back pain, unspecified: Secondary | ICD-10-CM | POA: Diagnosis not present

## 2020-10-22 DIAGNOSIS — F1010 Alcohol abuse, uncomplicated: Secondary | ICD-10-CM | POA: Diagnosis not present

## 2020-10-22 DIAGNOSIS — U071 COVID-19: Secondary | ICD-10-CM | POA: Diagnosis not present

## 2020-10-22 DIAGNOSIS — B451 Cerebral cryptococcosis: Secondary | ICD-10-CM | POA: Diagnosis not present

## 2020-10-22 LAB — BASIC METABOLIC PANEL
Anion gap: 7 (ref 5–15)
BUN: 20 mg/dL (ref 8–23)
CO2: 24 mmol/L (ref 22–32)
Calcium: 10 mg/dL (ref 8.9–10.3)
Chloride: 97 mmol/L — ABNORMAL LOW (ref 98–111)
Creatinine, Ser: 1.02 mg/dL (ref 0.61–1.24)
GFR, Estimated: >60 mL/min (ref >60)
Glucose, Bld: 108 mg/dL — ABNORMAL HIGH (ref 70–99)
Potassium: 5.5 mmol/L — ABNORMAL HIGH (ref 3.5–5.1)
Sodium: 128 mmol/L — ABNORMAL LOW (ref 135–145)

## 2020-10-22 LAB — POTASSIUM: Potassium: 5 mmol/L (ref 3.5–5.1)

## 2020-10-22 MED ORDER — SODIUM ZIRCONIUM CYCLOSILICATE 5 G PO PACK
5.0000 g | PACK | Freq: Once | ORAL | Status: AC
  Administered 2020-10-22: 5 g via ORAL
  Filled 2020-10-22: qty 1

## 2020-10-22 MED ORDER — SULFAMETHOXAZOLE-TRIMETHOPRIM 400-80 MG PO TABS
1.0000 | ORAL_TABLET | Freq: Every day | ORAL | Status: DC
  Administered 2020-10-22 – 2020-10-26 (×5): 1 via ORAL
  Filled 2020-10-22 (×6): qty 1

## 2020-10-22 MED ORDER — BOOST PLUS PO LIQD
237.0000 mL | Freq: Three times a day (TID) | ORAL | Status: DC
  Administered 2020-10-22 – 2020-10-23 (×2): 237 mL via ORAL
  Filled 2020-10-22 (×5): qty 237

## 2020-10-22 MED ORDER — SODIUM CHLORIDE 1 G PO TABS
1.0000 g | ORAL_TABLET | Freq: Three times a day (TID) | ORAL | Status: DC
  Administered 2020-10-22 – 2020-10-23 (×2): 1 g via ORAL
  Filled 2020-10-22 (×3): qty 1

## 2020-10-22 NOTE — Progress Notes (Signed)
Family Medicine Teaching Service Daily Progress Note Intern Pager: [phone]  Patient name: Ricky [name] Medical record number: [id number] Date of birth: 11-02-1955 Age: 65 y.o. Gender: male  Primary Care Provider: Pcp, No Consultants: cards signed off, neuro signed off, id Code Status: full  Pt Overview and Major Events to Date:  9/9 admitted 9/10 cefepime d/c'd, MRI no acute abnormalities, chronic subdural hematomas, left maxillary sinus fluid level 9/11 refused LP started on fluconazole and atovaquone 9/12 LP performed 9/13 suspected spinal HA, switch to bactrim for PJP coverage 9/14 blood patch administered  Assessment and Plan:  65 year old male presenting with cough fever chest pain and PMH of HIV/AIDS, MAC, SVT s/p unsuccessful ablation, CKD, HTN, HLD, protein calorie malnutrition  Headache(resolved), covid 19,HIV/AIDS with immunocompromised state & hx of cryptococcal meningitis No headache this AM  -ID consulted, appreciate recs- bactrim for PJP coverage, fluconazole, repeat COVID Pcr -F/u fungal cx in 2 weeks -mucinex q12h, flonase -tylenol 1000 mg q6h -airborne and contact precautions  -incentive spirometry -f/u CT in 3 mo for nodular opacities in LL -covid quarantine period of 21 days due to repeat cycle threshold of 23  Dizziness  His main complaint is when he stands up  -likely related to hyponatremia. -sodium tablets -monitor clinically  SVT s/p unsuccesful ablation, sinus bradycardia Telemetry showed no SVT chest pain none palpatations no -verapamil -cardiac monitoring  Hyperkalemia -K of 5.1, repeat of 5.5 -lokelma -repeat K in afternoon -single strength bactrim per ID  Chronic hyponatremia Sodium of 128 likely from HIV/SIADH -sodium tablets  Elevated LFTs AST/ALT  -monitor  Polysubstance abuse -rehab after hospitalization  FEN/GI: Regular PPx: Lovenox Dispo:Rehab pending quarantine   Subjective:  Complains of dizziness on  standing  Objective: Temp:  [97.8 F (36.6 C)-98 F (36.7 C)] 98 F (36.7 C) (09/19 0325) Pulse Rate:  [67-87] 67 (09/19 0325) Resp:  [16-23] 16 (09/19 0325) BP: (110-114)/(75-76) 114/75 (09/19 0325) SpO2:  [96 %-99 %] 99 % (09/19 0325) Physical Exam: General: NAD Cardiovascular: RRR Respiratory: ctab no iwob Abdomen: nondistended Extremities: moves freely  Laboratory: Recent Labs  Lab 10/19/20 0221 10/20/20 0143 10/21/20 0155  WBC 2.9* 3.6* 4.6  HGB 13.4 12.5* 12.5*  HCT 39.5 37.1* 37.8*  PLT 398 370 398   Recent Labs  Lab 10/19/20 0221 10/20/20 0143 10/21/20 0155 10/21/20 2211  NA 129* 126* 128* 127*  K 4.6 4.9 5.3* 5.1  CL 98 94* 95* 95*  CO2 23 20* 24 24  BUN _0 CREATININE 0.93 0.92 0.96 1.11  CALCIUM 9.5 9.8 9.5 9.8  PROT 8.1 7.8 8.4*  --   BILITOT 1.0 1.2 0.9  --   ALKPHOS 138* 147* 142*  --   ALT 31 32 36  --   AST 91* 89* 97*  --   GLUCOSE 135* 138* 137* 139*      Imaging/Diagnostic Tests:   Gerrit Heck, MD 10/22/2020, 5:18 AM PGY-1, [place] Intern pager: [phone], text pages welcome

## 2020-10-22 NOTE — Progress Notes (Signed)
Nutrition Follow-up  DOCUMENTATION CODES:  Not applicable  INTERVENTION:  Obtain updated weight.  Discontinue Ensure TID.  Add Boost Plus po TID, each supplement provides 360 kcal and 14 grams of protein.  Continue MVI with minerals daily.  NUTRITION DIAGNOSIS:  Increased nutrient needs related to catabolic illness as evidenced by estimated needs. - ongoing  GOAL:  Patient will meet greater than or equal to 90% of their needs - progressing  MONITOR:  PO intake, Supplement acceptance, Weight trends, Labs, I & O's  REASON FOR ASSESSMENT:  Consult Assessment of nutrition requirement/status  ASSESSMENT:  Pt with PMH significant for cryptococcal meningitis, HIV/AIDS, MAC, SVT s/p unsuccessful ablation, CKD, HTN, dyslipidemia, and polysubstance abuse admitted with COVID-19 and HIV/AIDS w/ immunocompromised state. 9/9 - admitted 9/11 - refused LP 9/12 - LP performed  RD working remotely.  Per Epic, pt with an average of 75% PO intake over the last 8 meals, well documented. Pt refusing Ensure.  Pt due for new weight. RD to order.  Recommend trying to switch to Boost Plus TID since pt has been refusing Ensure. Continue MVI and folic acid supplements.  Supplements: Ensure Enlive TID  Medications: reviewed; Diflucan, folic acid, MVI with minerals, NaCl TID, bactrim PO  Labs: reviewed; Na 128 (L), K 5.5 (H), Glucose 108 (H)  Diet Order:   Diet Order             Diet regular Room service appropriate? No; Fluid consistency: Thin  Diet effective now                  EDUCATION NEEDS:  No education needs have been identified at this time  Skin:  Skin Assessment: Reviewed RN Assessment  Last BM:  9/10  Height:  Ht Readings from Last 1 Encounters:  10/13/20 _0  (1.854 m)   Weight:  Wt Readings from Last 1 Encounters:  10/13/20 77.1 kg   BMI:  Body mass index is 22.43 kg/m.  Estimated Nutritional Needs:  Kcal:  2100-2300 Protein:  105-115 grams Fluid:   >2.1L  Derrel Nip, RD, LDN (she/her/hers) Registered Dietitian I After-Hours/Weekend Pager # in [place]

## 2020-10-22 NOTE — TOC Progression Note (Signed)
Transition of Care [facility]) - Progression Note    Patient Details  Name: Ricky [name] MRN: [id number] Date of Birth: [birth year]  Transition of [facility]) CM/SW Contact  Joanne Chars, LCSW Phone Number: 10/22/2020, 1:23 PM  Clinical Narrative:     0900: CSW LM with [name] Patient at [facility] regarding DC tomorrow and possible residential treatment beds.  1320: CSW left second message with [name].         Expected Discharge Plan and Services                                                 Social Determinants of Health (SDOH) Interventions    Readmission Risk Interventions Readmission Risk Prevention Plan 04/25/2020  Transportation Screening Complete  PCP or Specialist Appt within 3-5 Days Complete  HRI or [place] Complete  Social Work Consult for [place] Planning/Counseling Complete  Palliative Care Screening Not Applicable  Medication Review [employer]) Complete  Some recent data might be hidden

## 2020-10-22 NOTE — Progress Notes (Signed)
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 He has stable chronic dry cough without worsening dypsnea  His serum crypto ag is 320. Repeat cd4/hiv vl in process  Patient initially refused LP but now agreeable. I discussed with him need to r/o active culturable cryptococcus involvement of the brain. If that is the case will need to reinduce treatment and defer ART. If no culturable crypto agent, can continue with fluconazole. Would then perhaps also start ART  I have also sent urine histo/blasto. Pending LP  Other oi consideration. Cxr stable persistent cystic/nodular chagnes left upper lobe without new pna. Agree less likely to represent active NTM lung process or new pneumocystis process. Previous MAC likely colonization.   Unclear covid is incidental finding. He has chronic cough unchanged and not on o2 supplement.   --------------- 9/19 assessment Repeat covid pcr on 9/17 still with high cycle threshold (23); 9/09 covid ct is 22.5. patient is assymptomatic except mild intermittent dry cough which has been getting better. Unfortunately given the persistently high cycle threshold, would keep in covid isolation for 21 days 9/12 csf glucose/protein normal and <5wbc although serum  cd4 is <35; unfortunately no opening pressure recorded. Fungal cx is in process  Tolerating high dose fluconazole  Relay to team patient's concern of having to go back to previous dwelling and relapse in drug use again.     PLAN: In another week 9/25 if csf cx negative, can change fluconazole dose to 400 mg daily and start ART Id clinic f/u Dr West Bali on 9/21 @ 1030 Continue bactrim pjp prophy; elevated potassium so will decrease to SS tablet daily 7.   Hopefully SW/CM could assist him with new housing accomodation; await drug rehab dispo at this time 8.   Keep in covid isolation for 21 days from 9/09 9.   [facility] to help with hiv social support 10. Rescheduled outpatient ID clinic to 9/26 @ 1015am   I spent more than 35 minute reviewing data/chart, and coordinating care and >50% direct face to face time providing counseling/discussing diagnostics/treatment plan with patient    Active Problems:   Meningitis, cryptococcal ([place])   AIDS (acquired immune deficiency syndrome) ([place])   Paroxysmal SVT (supraventricular tachycardia) ([place])   Mycobacterium avium complex ([place])   COVID-19   COVID-19 virus infection   Community acquired pneumonia   Nonadherence to medical treatment   HIV infection ([place])   Chronic low back pain   Allergies  Allergen Reactions   Bactrim [Sulfamethoxazole-Trimethoprim] Other (See Comments)    Patient was trialed on DS TIW and SS daily for PJP prophylaxis and developed hyperkalemia and increased Scr    Scheduled Meds:  dextromethorphan-guaiFENesin  1 tablet Oral BID   enoxaparin (LOVENOX) injection  40  mg Subcutaneous Q24H   feeding supplement  237 mL Oral TID BM   fluconazole  800 mg Oral Daily   fluticasone  1 spray Each Nare Daily   folic acid  1 mg Oral Daily   multivitamin with minerals  1 tablet Oral Daily   sodium chloride  1 g Oral TID WC   sulfamethoxazole-trimethoprim  1 tablet Oral Daily   verapamil  120  mg Oral Daily   Continuous Infusions:   PRN Meds:.acetaminophen, lidocaine (PF), polyvinyl alcohol   SUBJECTIVE: Cough almost resolved No f/c No headache, n/v No rash Csf fungal cx remains negative  Cycle threshold high still for covid repeat  Review of Systems: ROS All other ROS was negative, except mentioned above     OBJECTIVE: Vitals:   10/21/20 0818 10/21/20 1918 10/22/20 0325 10/22/20 0810  BP: 113/75 110/76 114/75 110/89  Pulse: 71 87 67 70  Resp: 17 (!) _0 Temp: 97.8 F (36.6 C) 97.8 F (36.6 C) 98 F (36.7 C) 97.9 F (36.6 C)  TempSrc: Oral Oral  Oral  SpO2: 98% 96% 99% 97%  Weight:      Height:       Body mass index is 22.43 kg/m.  Physical Exam  General/constitutional: no distress, pleasant HEENT: Normocephalic, PER, Conj Clear, EOMI, Oropharynx clear Neck supple CV: rrr no mrg Lungs: clear to auscultation, normal respiratory effort Abd: Soft, Nontender Ext: no edema Skin: No Rash Neuro: nonfocal MSK: no peripheral joint swelling/tenderness/warmth; back spines nontender       Lab Results Lab Results  Component Value Date   WBC 4.6 10/21/2020   HGB 12.5 (L) 10/21/2020   HCT 37.8 (L) 10/21/2020   MCV 101.6 (H) 10/21/2020   PLT 398 10/21/2020    Lab Results  Component Value Date   CREATININE 1.02 10/22/2020   BUN 20 10/22/2020   NA 128 (L) 10/22/2020   K 5.5 (H) 10/22/2020   CL 97 (L) 10/22/2020   CO2 24 10/22/2020    Lab Results  Component Value Date   ALT 36 10/21/2020   AST 97 (H) 10/21/2020   ALKPHOS 142 (H) 10/21/2020   BILITOT 0.9 10/21/2020      Microbiology: Recent Results (from the past 240 hour(s))  Blood culture (routine x 2)     Status: None   Collection Time: 10/12/20  2:45 PM   Specimen: BLOOD LEFT FOREARM  Result Value Ref Range Status   Specimen Description BLOOD LEFT FOREARM  Final   Special Requests   Final    BOTTLES DRAWN AEROBIC AND ANAEROBIC Blood Culture adequate volume   Culture    Final    NO GROWTH 5 DAYS Performed at [facility], [address], [place], [place] [address]    Report Status 10/17/2020 FINAL  Final  Blood culture (routine x 2)     Status: None   Collection Time: 10/12/20  5:10 PM   Specimen: BLOOD RIGHT WRIST  Result Value Ref Range Status   Specimen Description BLOOD RIGHT WRIST  Final   Special Requests   Final    BOTTLES DRAWN AEROBIC AND ANAEROBIC Blood Culture adequate volume   Culture   Final    NO GROWTH 5 DAYS Performed at [facility], [place] [address] [place],  [address]    Report Status 10/17/2020 FINAL  Final  Blastomyces Antigen     Status: None   Collection Time: 10/14/20  5:22 AM   Specimen: Blood  Result Value Ref Range Status   Blastomyces Antigen None Detected None Detected ng/mL Final    Comment: (NOTE) Results reported as ng/mL in 0.2 - 14.7 ng/mL range Results above the limit of detection but below 0.2 ng/mL are reported as 'Positive, Below the Limit of Quantification' Results above 14.7 ng/mL are reported as 'Positive, Above the Limit of Quantification'    Specimen Type SERUM  Final    Comment: (NOTE) Performed At: [facility] [place], [place] [id number] Bruce Donath MD JQ:[id number]   Fungus Culture With Stain     Status: None (Preliminary result)   Collection Time: 10/15/20  4:18 PM  Result Value Ref Range Status   Fungus Stain Final report  Final    Comment: (NOTE) Performed At: [facility] [place], [place] [id number] [name] MD JM:[id number]    Fungus (Mycology) Culture PENDING  Incomplete   Fungal Source CSF  Final    Comment: Performed at [facility], [place] [address] [place], [place] [address]  Fungus Culture Result     Status: None   Collection Time: 10/15/20  4:18 PM  Result Value Ref Range Status   Result 1 Comment  Final    Comment: (NOTE) KOH/Calcofluor preparation:  no fungus observed. Performed At: [facility] [place], [place] [id number] [name] MD EY:[id number]   CSF culture w Stat Gram Stain     Status: None   Collection Time: 10/15/20  4:21 PM   Specimen: CSF; Cerebrospinal Fluid  Result Value Ref Range Status   Specimen Description CSF  Final   Special Requests NONE  Final   Gram Stain   Final    WBC PRESENT, PREDOMINANTLY MONONUCLEAR NO ORGANISMS SEEN CYTOSPIN SMEAR    Culture   Final    NO GROWTH 3 DAYS Performed at [facility], [address], [place], [place] [address]    Report Status 10/18/2020 FINAL  Final  SARS CORONAVIRUS 2 (TAT 6-24 HRS) Nasopharyngeal Nasopharyngeal Swab     Status: Abnormal   Collection Time: 10/19/20 11:03 AM   Specimen: Nasopharyngeal Swab  Result Value Ref Range Status   SARS Coronavirus 2 POSITIVE (A) NEGATIVE Final    Comment: (NOTE) SARS-CoV-2 target nucleic acids are DETECTED.  The SARS-CoV-2 RNA is generally detectable in upper and lower respiratory specimens during the acute phase of infection. Positive results are indicative of the presence of SARS-CoV-2 RNA. Clinical correlation with patient history and other diagnostic information is  necessary to determine patient infection status. Positive results do not rule out bacterial infection or co-infection with other viruses.  The expected result is Negative.  Fact Sheet for Patients: SugarRoll.be  Fact Sheet for Healthcare Providers: https://www.woods-mathews.com/  This test is not yet approved or cleared by the [place] FDA and  has been authorized for detection and/or diagnosis of SARS-CoV-2 by FDA under an Emergency Use Authorization (EUA). This EUA will remain  in effect (meaning this test can be used) for the duration of the COVID-19 declaration under Section 564(b)(1) of the Act, 21 U. S.C. section 360bbb-3(b)(1), unless the authorization is terminated or revoked sooner.   Performed at  [facility], [place] [address], [place], [place] [address]   Resp Panel by RT-PCR (Flu A&B, Covid) Nasopharyngeal Swab     Status: Abnormal   Collection Time: 10/20/20 11:28 AM   Specimen: Nasopharyngeal Swab; Nasopharyngeal(NP) swabs in vial transport medium  Result Value  Ref Range Status   SARS Coronavirus 2 by RT PCR POSITIVE (A) NEGATIVE Final    Comment: RESULT CALLED TO, READ BACK BY AND VERIFIED WITH: Hilton Cork RN, AT 1332 10/20/20 D. VANHOOK (NOTE) SARS-CoV-2 target nucleic acids are DETECTED.  The SARS-CoV-2 RNA is generally detectable in upper respiratory specimens during the acute phase of infection. Positive results are indicative of the presence of the identified virus, but do not rule out bacterial infection or co-infection with other pathogens not detected by the test. Clinical correlation with patient history and other diagnostic information is necessary to determine patient infection status. The expected result is Negative.  Fact Sheet for Patients: EntrepreneurPulse.com.au  Fact Sheet for Healthcare Providers: IncredibleEmployment.be  This test is not yet approved or cleared by the [place] FDA and  has been authorized for detection and/or diagnosis of SARS-CoV-2 by FDA under an Emergency Use Authorization (EUA).  This EUA will remain in effect (meaning this tes t can be used) for the duration of  the COVID-19 declaration under Section 564(b)(1) of the Act, 21 U.S.C. section 360bbb-3(b)(1), unless the authorization is terminated or revoked sooner.     Influenza A by PCR NEGATIVE NEGATIVE Final   Influenza B by PCR NEGATIVE NEGATIVE Final    Comment: (NOTE) The Xpert Xpress SARS-CoV-2/FLU/RSV plus assay is intended as an aid in the diagnosis of influenza from Nasopharyngeal swab specimens and should not be used as a sole basis for treatment. Nasal washings and aspirates are unacceptable for Xpert Xpress  SARS-CoV-2/FLU/RSV testing.  Fact Sheet for Patients: EntrepreneurPulse.com.au  Fact Sheet for Healthcare Providers: IncredibleEmployment.be  This test is not yet approved or cleared by the [place] FDA and has been authorized for detection and/or diagnosis of SARS-CoV-2 by FDA under an Emergency Use Authorization (EUA). This EUA will remain in effect (meaning this test can be used) for the duration of the COVID-19 declaration under Section 564(b)(1) of the Act, 21 U.S.C. section 360bbb-3(b)(1), unless the authorization is terminated or revoked.  Performed at [facility], [place] [address] [place], [place] [address]      Serology:   Imaging: If present, new imagings (plain films, ct scans, and mri) have been personally visualized and interpreted; radiology reports have been reviewed. Decision making incorporated into the Impression / Recommendations  9/10 mri brain 1. No evidence of acute intracranial abnormality on this motion limited study. 2. Similar trace bilateral anterior frontal convexity subdural hematomas. 3. Paranasal sinus disease with air-fluid level in the left maxillary sinus.   9/09 cxr 1. Persistent cystic and nodular opacities within the left upper lobe. As described on previous CT, these findings are favored to represent sequelae of atypical infection. 2. No acute superimposed airspace consolidation.  Jabier Mutton, [place] for Infectious [place] [phone] pager    10/22/2020, 12:56 PM

## 2020-10-23 ENCOUNTER — Encounter (HOSPITAL_COMMUNITY): Payer: Self-pay | Admitting: Family Medicine

## 2020-10-23 DIAGNOSIS — U071 COVID-19: Secondary | ICD-10-CM | POA: Diagnosis not present

## 2020-10-23 DIAGNOSIS — B451 Cerebral cryptococcosis: Secondary | ICD-10-CM | POA: Diagnosis not present

## 2020-10-23 DIAGNOSIS — B20 Human immunodeficiency virus [HIV] disease: Secondary | ICD-10-CM | POA: Diagnosis not present

## 2020-10-23 DIAGNOSIS — F1010 Alcohol abuse, uncomplicated: Secondary | ICD-10-CM | POA: Diagnosis not present

## 2020-10-23 LAB — CBC
HCT: 40 % (ref 39.0–52.0)
Hemoglobin: 13.5 g/dL (ref 13.0–17.0)
MCH: 34.4 pg — ABNORMAL HIGH (ref 26.0–34.0)
MCHC: 33.8 g/dL (ref 30.0–36.0)
MCV: 101.8 fL — ABNORMAL HIGH (ref 80.0–100.0)
Platelets: 423 10*3/uL — ABNORMAL HIGH (ref 150–400)
RBC: 3.93 MIL/uL — ABNORMAL LOW (ref 4.22–5.81)
RDW: 11.6 % (ref 11.5–15.5)
WBC: 3.6 10*3/uL — ABNORMAL LOW (ref 4.0–10.5)
nRBC: 0 % (ref 0.0–0.2)

## 2020-10-23 LAB — BASIC METABOLIC PANEL
Anion gap: 9 (ref 5–15)
BUN: 20 mg/dL (ref 8–23)
CO2: 24 mmol/L (ref 22–32)
Calcium: 9.8 mg/dL (ref 8.9–10.3)
Chloride: 93 mmol/L — ABNORMAL LOW (ref 98–111)
Creatinine, Ser: 1.06 mg/dL (ref 0.61–1.24)
GFR, Estimated: >60 mL/min (ref >60)
Glucose, Bld: 105 mg/dL — ABNORMAL HIGH (ref 70–99)
Potassium: 4.8 mmol/L (ref 3.5–5.1)
Sodium: 126 mmol/L — ABNORMAL LOW (ref 135–145)

## 2020-10-23 MED ORDER — HYDROCORTISONE 0.5 % EX CREA
TOPICAL_CREAM | CUTANEOUS | Status: DC | PRN
  Filled 2020-10-23: qty 28.35

## 2020-10-23 MED ORDER — ENSURE ENLIVE PO LIQD
237.0000 mL | Freq: Three times a day (TID) | ORAL | Status: DC
  Filled 2020-10-23: qty 237

## 2020-10-23 MED ORDER — SODIUM CHLORIDE 1 G PO TABS
2.0000 g | ORAL_TABLET | Freq: Three times a day (TID) | ORAL | Status: DC
  Administered 2020-10-23 – 2020-10-25 (×6): 2 g via ORAL
  Filled 2020-10-23 (×8): qty 2

## 2020-10-23 NOTE — Progress Notes (Signed)
FPTS Brief Progress Note  S: Resting in bed watching TV.   O: BP 105/70 (BP Location: Right Arm)   Pulse (!) 57   Temp 97.8 F (36.6 C) (Oral)   Resp 16   Ht 6\' 1"  (1.854 m)   Wt 77.1 kg   SpO2 100%   BMI 22.43 kg/m   General: Appears well, no acute distress. Age appropriate. Respiratory: normal effort  A/P: 1. COVID-19  2. HIV infection, unspecified symptom status ([place])  3. Community acquired pneumonia, unspecified laterality  4. Alcohol abuse, daily use  5. Chronic low back pain, unspecified back pain laterality, unspecified whether sciatica present  - Orders reviewed. Labs for AM ordered, which was adjusted as needed.   Gerlene Fee, DO 10/23/2020, 12:52 AM PGY-3, [facility] Night Resident  Please page [phone] with questions.

## 2020-10-23 NOTE — Plan of Care (Signed)

## 2020-10-23 NOTE — Progress Notes (Addendum)
FPTS Brief Progress Note  S: Resting in bed watching TV. Would like ice. Nursing present outside of room.   O: BP 128/77 (BP Location: Left Arm)   Pulse (!) 58   Temp 97.6 F (36.4 C) (Axillary)   Resp 18   Ht 6\' 1"  (1.854 m)   Wt 77.1 kg   SpO2 98%   BMI 22.43 kg/m   General: Appears well, no acute distress. Age appropriate. Respiratory: normal effort   A/P: 1. COVID-19  2. HIV infection, unspecified symptom status ([place])  3. Community acquired pneumonia, unspecified laterality  4. Alcohol abuse, daily use  5. Chronic low back pain, unspecified back pain laterality, unspecified whether sciatica present - naproxen (NAPROSYN) tablet 375 mg   - Orders reviewed. Labs for AM ordered, which was adjusted as needed.  - Patient given ice by nursing staff  Gerlene Fee, DO 10/23/2020, 11:37 PM PGY-3, [place] [facility] Night Resident  Please page [phone] with questions.

## 2020-10-23 NOTE — Progress Notes (Signed)
Family Medicine Teaching Service Daily Progress Note Intern Pager: [phone]  Patient name: Ricky [name] Medical record number: [id number] Date of birth: [birth year] Age: 65 y.o. Gender: male  Primary Care Provider: Pcp, No Consultants: Cards signed off, Neurology signed off, ID Code Status: Full  Pt Overview and Major Events to Date:  9/9 admitted 9/10 cefepime d/c'd, MRI no acute abnormalities, chronic subdural hematomas, left maxillary sinus fluid level 9/11 refused  LP started on fluconazole and atovaquone 9/12 LP performed 9/13 suspected spina HA, switch to bactrim for PJP coverage 9/14 blood patch administered 9/19 Needs 21 day quarantine from 9/9 given high cycle threshold  Assessment and Plan:  65 year old man presenting with cough, fever, and chest pain with PMH of HIV/AIDS, MAC,SVT, s/p unsuccessful ablation, CKD, HTN, HLD, protein calorie malnutrition  Headache (resolved), Covid 19, HIV/AIDS with immunocompromised state and Hx of Cryptococcal Menigitis No HA this AM -ID consulted, appreciate recs-bactrim for PJP coverage, fluconazole, repeat COVID PCR -F/U fungal cx in 2 weeks -mucinex q12h, flonase -tylenol 1000 mg q6h -airborne/contact precautions -incentive spirometry -f/u CT in 3 months -covid quarantine period of 21 days due to high cycle threshold (9/30)  SVT s/p unsuccessful ablation, sinus bradycardia Telemetry showed no SVT only sinus bradycardia.  -verapamil -cardiac monitoring d/c   Hyperkalemia -K of 4.8 -single strength bactrim  Chronic hyponatremia Sodium of 126 from 128 yesterday likely from HIV/SIADH -sodium tablet increase dosage  Elevated LFTs AST/ALT -monitor  Polysubstance abuse -rehab after hospitalization  FEN/GI: Regular PPx: Lovenox Dispo: Medically stable for rehab however has quarantine until 9/30   Subjective:  No complaints today, would like different drink not chocolate and feeling itchy, recommended showering  today.  Objective: Temp:  [97.8 F (36.6 C)-97.9 F (36.6 C)] 97.8 F (36.6 C) (09/20 0339) Pulse Rate:  [57-70] 68 (09/20 0339) Resp:  [16-17] 16 (09/20 0339) BP: (101-110)/(68-89) 101/68 (09/20 0339) SpO2:  [97 %-100 %] 100 % (09/20 0339) Physical Exam: General: NAD Cardiovascular: RRR Respiratory: CTAB Abdomen: Nondsitended nontender Extremities: moves freely  Laboratory: Recent Labs  Lab 10/20/20 0143 10/21/20 0155 10/23/20 0117  WBC 3.6* 4.6 3.6*  HGB 12.5* 12.5* 13.5  HCT 37.1* 37.8* 40.0  PLT 370 398 423*   Recent Labs  Lab 10/19/20 0221 10/20/20 0143 10/21/20 0155 10/21/20 2211 10/22/20 0734 10/22/20 1552 10/23/20 0117  NA 129* 126* 128* 127* 128*  --  126*  K 4.6 4.9 5.3* 5.1 5.5* 5.0 4.8  CL 98 94* 95* 95* 97*  --  93*  CO2 23 20* _0 --  24  BUN _1 --  20  CREATININE 0.93 0.92 0.96 1.11 1.02  --  1.06  CALCIUM 9.5 9.8 9.5 9.8 10.0  --  9.8  PROT 8.1 7.8 8.4*  --   --   --   --   BILITOT 1.0 1.2 0.9  --   --   --   --   ALKPHOS 138* 147* 142*  --   --   --   --   ALT 31 32 36  --   --   --   --   AST 91* 89* 97*  --   --   --   --   GLUCOSE 135* 138* 137* 139* 108*  --  105*      Imaging/Diagnostic Tests:   Gerrit Heck, MD 10/23/2020, 4:41 AM PGY-1, [place] Intern pager: [phone], text pages welcome

## 2020-10-23 NOTE — Progress Notes (Addendum)
Brief Nutrition Note  RD received message from MD regarding pt not liking Boost.  RD switched Boost TID for Ensure TID.  Unit RD will continue to follow-up as scheduled.  Derrel Nip, RD, LDN (she/her/hers) Registered Dietitian I After-Hours/Weekend Pager # in [place]

## 2020-10-23 NOTE — TOC Progression Note (Signed)
Transition of Care [facility]) - Progression Note    Patient Details  Name: Ricky [name] MRN: [id number] Date of Birth: [birth year]  Transition of Care [facility]) CM/SW Contact  Joanne Chars, LCSW Phone Number: 10/23/2020, 1:42 PM  Clinical Narrative:   CSW spoke with [facility] office.  [name] Patient, residential substance treatment contact, is out of the office and will not return until Thursday.  There is not anyone else who can arrange treatment while she is out, will need to contact her once she returns.            Expected Discharge Plan and Services                                                 Social Determinants of Health (SDOH) Interventions    Readmission Risk Interventions Readmission Risk Prevention Plan 04/25/2020  Transportation Screening Complete  PCP or Specialist Appt within 3-5 Days Complete  HRI or [place] Complete  Social Work Consult for [place] Planning/Counseling Complete  Palliative Care Screening Not Applicable  Medication Review [employer]) Complete  Some recent data might be hidden

## 2020-10-24 ENCOUNTER — Inpatient Hospital Stay: Payer: Medicaid Other | Admitting: Infectious Diseases

## 2020-10-24 DIAGNOSIS — U071 COVID-19: Secondary | ICD-10-CM | POA: Diagnosis not present

## 2020-10-24 DIAGNOSIS — B20 Human immunodeficiency virus [HIV] disease: Secondary | ICD-10-CM | POA: Diagnosis not present

## 2020-10-24 DIAGNOSIS — M545 Low back pain, unspecified: Secondary | ICD-10-CM | POA: Diagnosis not present

## 2020-10-24 DIAGNOSIS — B451 Cerebral cryptococcosis: Secondary | ICD-10-CM | POA: Diagnosis not present

## 2020-10-24 LAB — CBC WITH DIFFERENTIAL/PLATELET
Abs Immature Granulocytes: 0.1 10*3/uL — ABNORMAL HIGH (ref 0.00–0.07)
Basophils Absolute: 0.1 10*3/uL (ref 0.0–0.1)
Basophils Relative: 3 %
Eosinophils Absolute: 0 10*3/uL (ref 0.0–0.5)
Eosinophils Relative: 1 %
HCT: 37.3 % — ABNORMAL LOW (ref 39.0–52.0)
Hemoglobin: 12.4 g/dL — ABNORMAL LOW (ref 13.0–17.0)
Lymphocytes Relative: 6 %
Lymphs Abs: 0.2 10*3/uL — ABNORMAL LOW (ref 0.7–4.0)
MCH: 33.3 pg (ref 26.0–34.0)
MCHC: 33.2 g/dL (ref 30.0–36.0)
MCV: 100.3 fL — ABNORMAL HIGH (ref 80.0–100.0)
Metamyelocytes Relative: 2 %
Monocytes Absolute: 0.6 10*3/uL (ref 0.1–1.0)
Monocytes Relative: 18 %
Myelocytes: 1 %
Neutro Abs: 2.4 10*3/uL (ref 1.7–7.7)
Neutrophils Relative %: 69 %
Platelets: 379 10*3/uL (ref 150–400)
RBC: 3.72 MIL/uL — ABNORMAL LOW (ref 4.22–5.81)
RDW: 11.5 % (ref 11.5–15.5)
WBC: 3.5 10*3/uL — ABNORMAL LOW (ref 4.0–10.5)
nRBC: 0 % (ref 0.0–0.2)
nRBC: 0 /100 WBC

## 2020-10-24 LAB — HEPATIC FUNCTION PANEL
ALT: 40 U/L (ref 0–44)
AST: 86 U/L — ABNORMAL HIGH (ref 15–41)
Albumin: 2.7 g/dL — ABNORMAL LOW (ref 3.5–5.0)
Alkaline Phosphatase: 182 U/L — ABNORMAL HIGH (ref 38–126)
Bilirubin, Direct: <0.1 mg/dL (ref 0.0–0.2)
Total Bilirubin: 0.3 mg/dL (ref 0.3–1.2)
Total Protein: 8.2 g/dL — ABNORMAL HIGH (ref 6.5–8.1)

## 2020-10-24 LAB — BASIC METABOLIC PANEL
Anion gap: 8 (ref 5–15)
BUN: 20 mg/dL (ref 8–23)
CO2: 22 mmol/L (ref 22–32)
Calcium: 9.2 mg/dL (ref 8.9–10.3)
Chloride: 97 mmol/L — ABNORMAL LOW (ref 98–111)
Creatinine, Ser: 1.07 mg/dL (ref 0.61–1.24)
GFR, Estimated: >60 mL/min (ref >60)
Glucose, Bld: 136 mg/dL — ABNORMAL HIGH (ref 70–99)
Potassium: 5.1 mmol/L (ref 3.5–5.1)
Sodium: 127 mmol/L — ABNORMAL LOW (ref 135–145)

## 2020-10-24 LAB — URINALYSIS, MICROSCOPIC (REFLEX): WBC, UA: >50 WBC/hpf (ref 0–5)

## 2020-10-24 LAB — URINALYSIS, ROUTINE W REFLEX MICROSCOPIC
Bilirubin Urine: NEGATIVE
Glucose, UA: NEGATIVE mg/dL
Hgb urine dipstick: NEGATIVE
Ketones, ur: NEGATIVE mg/dL
Nitrite: NEGATIVE
Protein, ur: NEGATIVE mg/dL
Specific Gravity, Urine: 1.01 (ref 1.005–1.030)
pH: 6.5 (ref 5.0–8.0)

## 2020-10-24 MED ORDER — NEPRO/CARBSTEADY PO LIQD
237.0000 mL | Freq: Three times a day (TID) | ORAL | Status: DC
  Administered 2020-10-24 – 2020-10-26 (×7): 237 mL via ORAL

## 2020-10-24 MED ORDER — FLUTICASONE PROPIONATE 50 MCG/ACT NA SUSP
1.0000 | NASAL | Status: DC | PRN
  Filled 2020-10-24: qty 16

## 2020-10-24 NOTE — Progress Notes (Signed)
FPTS Brief Progress Note  S: Sitting up in bed watching TV. States he is doing well.    O: BP 127/82 (BP Location: Right Arm)   Pulse 67   Temp 97.7 F (36.5 C) (Oral)   Resp 18   Ht 6\' 1"  (1.854 m)   Wt 77.1 kg   SpO2 97%   BMI 22.43 kg/m   General: Appears well, no acute distress. Age appropriate. Respiratory: normal effort   A/P: 1. COVID-19  2. HIV infection, unspecified symptom status ([place])  3. Community acquired pneumonia, unspecified laterality  4. Alcohol abuse, daily use  5. Chronic low back pain, unspecified back pain laterality, unspecified whether sciatica present  - Orders reviewed. Labs for AM ordered, which was adjusted as needed.   Gerlene Fee, DO 10/24/2020, 9:26 PM PGY-3, [facility] Night Resident  Please page [phone] with questions.

## 2020-10-24 NOTE — Progress Notes (Signed)
Family Medicine Teaching Service Daily Progress Note Intern Pager: [phone]  Patient name: Ricky [name] Medical record number: [id number] Date of birth: 1955-09-09 Age: 65 y.o. Gender: male  Primary Care Provider: Pcp, No Consultants: Cards signed off, Neurology signed off, ID Code Status: Full  Pt Overview and Major Events to Date:  9/9 admitted 9/10 cefepime d/c'd, MRI no acute abn with chronic subdural hematomas, left maxillary sinus fluid level 9/11 refused LP, started on fluconazole and atovaquone 9/12 LP performed 9/13 suspected spinal HA switch to bactrim for PJP coverage  9/14 blood administered 9/19 21 day quarantine needed  Assessment and Plan:  65 year old man presenting with cough, fever, and chest pain with PMH of HIV/AIDS, MAC, SVT, s/p unsuccessful ablation, CKD, HTN, HLD, protein calorie malnutrition  Headache (resolved), Covid 19, HIV/AIDS with immunocompromised state and Hx of Cryptococcal meningitis No HA currently -ID consulted, appreciate recs-bactrim for PJP coverage, fluconazole, repeat COVID PCR -f/u fungal cx in 2 weeks -mucinex -flonase prn -tylenol prn -airborne/contact precautions -incentive spirometry -f/u CT 3 months -covid quarantine 21 days (9/30)  Urinary Frequency Says he was "up all night" urinating and has some tingling sensation when urinating. Some mild abdominal pain on back, no suprapubic tenderness to palpation. UA showed moderate leukocytes/bacteria, and no nitrites -f/u urine culture  SVT s/p unsuccessful ablation, sinus bradycardia Telemtry d/c'd yesterday due to stability -verapamil  Hyperkalemia -K 5.1 -single strength bactrim -monitor on cbc -switch to nepro to decrease K  Chronic hyponatremia Na 127, mildy improved from 126 -salt tablets 2g TID (got 4 g yesterday)  Polysubstance abuse Has use at sister's home which he is afraid of relapsing if he went back. Awaiting approval for rehab.  FEN/GI: Regular PPx:  Lovenox Dispo: Stable for rehab however is in quarantine still   Subjective:  Says he was up all night urinating  Objective: Temp:  [97.6 F (36.4 C)-97.7 F (36.5 C)] 97.7 F (36.5 C) (09/21 0646) Pulse Rate:  [58] 58 (09/20 1329) Resp:  [16-20] 16 (09/21 0646) BP: (107-128)/(71-80) 107/71 (09/21 0646) SpO2:  [97 %-99 %] 97 % (09/21 0646) Physical Exam: General: NAD Cardiovascular: RRR no m/r/g Respiratory: CTAB no iWOB Abdomen: Nondistended, nontender to palpation on suprapubic palpation Extremities: Moves freely, no edema  Laboratory: Recent Labs  Lab 10/21/20 0155 10/23/20 0117 10/24/20 0218  WBC 4.6 3.6* 3.5*  HGB 12.5* 13.5 12.4*  HCT 37.8* 40.0 37.3*  PLT 398 423* 379   Recent Labs  Lab 10/20/20 0143 10/21/20 0155 10/21/20 2211 10/22/20 0734 10/22/20 1552 10/23/20 0117 10/24/20 0218  NA 126* 128*   < > 128*  --  126* 127*  K 4.9 5.3*   < > 5.5* 5.0 4.8 5.1  CL 94* 95*   < > 97*  --  93* 97*  CO2 20* 24   < > 24  --  24 22  BUN 15 16   < > 20  --  20 20  CREATININE 0.92 0.96   < > 1.02  --  1.06 1.07  CALCIUM 9.8 9.5   < > 10.0  --  9.8 9.2  PROT 7.8 8.4*  --   --   --   --  8.2*  BILITOT 1.2 0.9  --   --   --   --  0.3  ALKPHOS 147* 142*  --   --   --   --  182*  ALT 32 36  --   --   --   --  40  AST 89* 97*  --   --   --   --  86*  GLUCOSE 138* 137*   < > 108*  --  105* 136*   < > = values in this interval not displayed.     Imaging/Diagnostic Tests:   Gerrit Heck, MD 10/24/2020, 10:45 AM PGY-1, [place] Intern pager: [phone], text pages welcome

## 2020-10-25 ENCOUNTER — Other Ambulatory Visit (HOSPITAL_COMMUNITY): Payer: Self-pay

## 2020-10-25 DIAGNOSIS — U071 COVID-19: Secondary | ICD-10-CM | POA: Diagnosis not present

## 2020-10-25 DIAGNOSIS — B20 Human immunodeficiency virus [HIV] disease: Secondary | ICD-10-CM | POA: Diagnosis not present

## 2020-10-25 DIAGNOSIS — B451 Cerebral cryptococcosis: Secondary | ICD-10-CM | POA: Diagnosis not present

## 2020-10-25 DIAGNOSIS — M545 Low back pain, unspecified: Secondary | ICD-10-CM | POA: Diagnosis not present

## 2020-10-25 DIAGNOSIS — F1010 Alcohol abuse, uncomplicated: Secondary | ICD-10-CM | POA: Diagnosis not present

## 2020-10-25 LAB — BASIC METABOLIC PANEL
Anion gap: 7 (ref 5–15)
BUN: 19 mg/dL (ref 8–23)
CO2: 21 mmol/L — ABNORMAL LOW (ref 22–32)
Calcium: 9.1 mg/dL (ref 8.9–10.3)
Chloride: 100 mmol/L (ref 98–111)
Creatinine, Ser: 0.9 mg/dL (ref 0.61–1.24)
GFR, Estimated: >60 mL/min (ref >60)
Glucose, Bld: 98 mg/dL (ref 70–99)
Potassium: 4.6 mmol/L (ref 3.5–5.1)
Sodium: 128 mmol/L — ABNORMAL LOW (ref 135–145)

## 2020-10-25 LAB — CBC
HCT: 36.7 % — ABNORMAL LOW (ref 39.0–52.0)
Hemoglobin: 12.4 g/dL — ABNORMAL LOW (ref 13.0–17.0)
MCH: 33.8 pg (ref 26.0–34.0)
MCHC: 33.8 g/dL (ref 30.0–36.0)
MCV: 100 fL (ref 80.0–100.0)
Platelets: 333 10*3/uL (ref 150–400)
RBC: 3.67 MIL/uL — ABNORMAL LOW (ref 4.22–5.81)
RDW: 11.4 % — ABNORMAL LOW (ref 11.5–15.5)
WBC: 4.1 10*3/uL (ref 4.0–10.5)
nRBC: 0 % (ref 0.0–0.2)

## 2020-10-25 MED ORDER — FLUCONAZOLE 100 MG PO TABS
400.0000 mg | ORAL_TABLET | Freq: Every day | ORAL | Status: DC
  Administered 2020-10-26: 400 mg via ORAL
  Filled 2020-10-25: qty 4

## 2020-10-25 MED ORDER — BICTEGRAVIR-EMTRICITAB-TENOFOV 50-200-25 MG PO TABS
1.0000 | ORAL_TABLET | Freq: Every day | ORAL | Status: DC
  Administered 2020-10-25 – 2020-10-26 (×2): 1 via ORAL
  Filled 2020-10-25 (×2): qty 1

## 2020-10-25 MED ORDER — SODIUM CHLORIDE 1 G PO TABS
3.0000 g | ORAL_TABLET | Freq: Three times a day (TID) | ORAL | Status: DC
  Administered 2020-10-25 – 2020-10-26 (×5): 3 g via ORAL
  Filled 2020-10-25 (×6): qty 3

## 2020-10-25 MED ORDER — FLUCONAZOLE 200 MG PO TABS
400.0000 mg | ORAL_TABLET | Freq: Every day | ORAL | 0 refills | Status: AC
  Filled 2020-10-25: qty 60, 30d supply, fill #0

## 2020-10-25 MED ORDER — SULFAMETHOXAZOLE-TRIMETHOPRIM 400-80 MG PO TABS
1.0000 | ORAL_TABLET | Freq: Every day | ORAL | 0 refills | Status: DC
  Filled 2020-10-25: qty 30, 30d supply, fill #0

## 2020-10-25 MED ORDER — BIKTARVY 50-200-25 MG PO TABS
1.0000 | ORAL_TABLET | Freq: Every day | ORAL | 0 refills | Status: DC
  Filled 2020-10-25: qty 30, 30d supply, fill #0

## 2020-10-25 NOTE — Discharge Instructions (Addendum)
Thank you for allowing [place] to  take care of you! You were admitted for Covid 19 and symptoms of headache and chest pain with a low immune system. We performed a lumbar puncture on you to check for any infection in your brain. Your fungal cultures or still pending and you are taking fluconazole for this. You had some chest pain as well which was likely cause from you heart beating faster than normal called SVTs. You were monitored by cardiologist and recommended to continue with the medication called verapamil. Your Covid-19 infection was treated with medications however because of your low immune system you still have an active infection. You were started on biktarvy which will help build your immunes system slowly. You will need to wear masks when around other people during your quarantine period (until 9/30).   -Wear your mask when around others during your quarantine period -Continue taking your biktarvy EVERY DAY as this helps build your immune system -Continue taking your verapamil EVERY DAY for your heart -Continue taking your Bactrim EVERY DAY to prevent other infections in your lungs -Continue taking your fluconazole EVERY DAY to prevent fungus from growing in your brain and causing headaches -We have referred you to [place] to help with social support -You will need a repeat scan of you chest in 3 months -Please go to your follow up appointment on 11/1 at 9 am  with the infectious disease specialist, Dr. Gale Journey  - you will likely be started on medications to help your immune system

## 2020-10-25 NOTE — Discharge Summary (Addendum)
[facility] Discharge Summary  Patient name: Ricky [name] Medical record number: [id number] Date of birth: March 31, 1955 Age: 65 y.o. Gender: male Date of Admission: 10/12/2020  Date of Discharge: 10/26/2020 Admitting Physician: Leeanne Rio, MD  Primary Care Provider: Pcp, No Consultants: ID, Cardiology, Neurology  Indication for Hospitalization: Fever, Chest Pain, Headache in Immunocompromised State  Discharge Diagnoses/Problem List:  Covid 19, HIV/AIDs with immunocompromised state and Hx of Cryptococcal meningitis  Urinary Frequency Chronic Hyponatremia SVT Sinus Bradycardia Hyperkalemia Polysubstance Abuse Urinary Frequency  Disposition: Home with Drug Rehab Set Up after completion of quarantine  Discharge Condition: Stable  Discharge Exam:  General: NAD, awake, alert, responsive to questions Head: Normocephalic atraumatic CV: Regular rate and rhythm no murmurs rubs or gallops Respiratory: Clear to ausculation bilaterally, no wheezes rales or crackles, chest rises symmetrically,  no increased work of breathing Abdomen: Soft, non-tender to palpation, non-distended, normoactive bowel sounds  Extremities: Moves upper and lower extremities freely, no edema in LE Neuro: No focal deficits  Brief Hospital Course:  Ricky [name] a 7 year who presented with cough, fever, and chest pain.  Past medical history significant for cryptococcal meningitis, HIV/AIDS, MAC, SVT s/p unsuccessful ablation, CKD, HTN, dyslipidemia, routine calorie malnutrition, polysubstance abuse. Hospital course detailed below:   Fever in the setting of Covid-19, HIV/AIDs with immunocompromised state Patient came in with an onset of fever and chest pain 1 month ago with increased fatigue.  In the ED troponins trended flat, tested positive for COVID-19, had a serum sodium of 126, and a CTA showed no evidence of pulmonary embolism.  T-max in the emergency department was 100.1 but  no documented fever.  Last CD4 count was <30 not on ART and has been off medications for 2 to 4 years.  He had a recent hospitalization and March 2022 for cryptococcal meningitis and MAC. He did not complete his treatment regimens after discharge and did not follow-up.  He was previously discharged on single strength Bactrim for PCP due to his G6PD deficiency and DDI of atovaquone and rifamycin and did not continue these medications.  Patient has nodular opacities upper left lower lobe on chest x-ray and CTA chest.  Read by radiology as consistent with atypical pneumonia such as MAC.  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  Legionella testing was negative. He was empirically started on vancomycin and cefepime, which was discontinued on hospital day 2.  Patient was started on remdesivir for a 5-day course, and was not started on steroids given immunocompromise state. Patient remained stable on room air.  History of cryptococcal meningitis His admission in March 2022 was found to have cryptococcal meningitis, he had headaches at that time which improved with lumbar punctures.  He was started on fluconazole at discharge which he reports he did not complete.  He did not have any focal neurologic deficits, but had a slight frontal headache.  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  Plan was for LP by neurology, but patient refused. He was started on fluconazole 800 mg daily by ID. Patient later got LP which was clear and cultures showed no growth. Decreased fluconazole to 400 mg per ID. Patient was instructed to follow up with ID outpatient with scheduled appointment prior to discharge.  SVT s/p successful ablation Patient had SVT in March with an ablation that was unsuccessful.  He has not been continue his metoprolol outpatient and regularly uses with  cocaine.  Had 2 bouts of SVT on telemetry. EKG is with normal sinus rhythm and troponins that trended flat. Had an episode of sustained Vtach for 6 minutes and cardiology was consulted for medication management and recommended digoxin at night for SVT control. Patient remained stable after this. K and Mag were monitored while on this. He was later transitioned to verapamil for control of this issue. He is not a candidate for repeat ablation. Patient discharged on verapamil.   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  He was discharged on 3 months of triple therapy with azithromycin, rifabutin, and ethambutol.  He did not complete these medications.  Chest x-ray and CTA of the chest showed left upper lobe infiltration, and was an unlikely presentation for MAC in patients who do not have COPD or atelectasis.  ID did not feel it necessary to treat for MAC given patient's poor compliance with medications and less severe than his other issues.   Chronic hyponatremia On admission was 126 and stayed around this.  Urine sodium and urine osmolality were normal.  Cortisol was normal making it unlikely to be related to adrenal insufficiency. TSH was normal at 0.628 in February 2022, unlikely to have been related to thyroid. 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 He was given salt tablet during his stay which he was continued on at discharge.  Elevated LFTs Patient's AST and ALT were elevated with AST being greater than ALT in the 50s and Teens respectively.  Patient did not have right upper quadrant pain during hospital stay.  Polysubstance abuse CIWA scores ranged from 0-4 with elevations being closely x2 SVT runs.  Did not have any DTs.  Was put on folic acid injections and multivitamin containing thiamine.  Moderate protein calorie malnutrition Patient has chronic calcific pancreatitis and chronic malnutrition.  Registered dietitian consult was made and recommended Ensure  Enlive p.o. 3 times daily with multivitamins and minerals daily.  Hypertension Patient has history of hypertension Has a history of cocaine use. Did not have any hypertensive emergencies urgencies. Verapamil helped with this when started for SVT.  Urinary Frequency Patient had urinary frequency 3 days before discharge. On fist day di have some dysuria which resolved on its own after this day. UA was collected which showed moderate leukocytes, negative nitrities. Urine culture showed 20,000 colonies of E Faecalis, likely contaminated colonization. 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 No additional abx prescribed for patient given dysuria resolved well prior to discharge and culture insignificant growth.    Issues for Follow Up:  Mediastinal lymph nodes increased in size compared to prior exam, recommend repeat CT in 3 months Outpatient ID follow up and be connected with bridge counselor to be brought to appointment  SA resources/treatment Outpatient ID clinic 11/1 @ 9 am at the [facility] Continue 400 mg fluconazole daily Continue biktarvy daily Referral made to [facility] to help with HIV social support Encouraged continued cessation of alcohol and drug use.   Significant Procedures: Lumbar Puncture  Significant Labs and Imaging:  Recent Labs  Lab 10/24/20 0218 10/25/20 0236 10/26/20 0217  WBC 3.5* 4.1 3.5*  HGB 12.4* 12.4* 13.0  HCT 37.3* 36.7* 39.9  PLT 379 333 361   Recent Labs  Lab 10/20/20 0143 10/21/20 0155 10/21/20 2211 10/22/20 0734 10/22/20 1552 10/23/20 0117 10/24/20 0218 10/25/20 0236 10/26/20 0700  NA 126* 128*   < >  128*  --  126* 127* 128* 129*  K 4.9 5.3*   < > 5.5*   < > 4.8 5.1 4.6 4.8  CL 94* 95*   < > 97*  --  93* 97* 100 101  CO2 20* 24   < > 24  --  24 22 21* 21*  GLUCOSE 138* 137*   < > 108*  --  105* 136* 98 129*  BUN 15 16   < > 20  --  _0 CREATININE 0.92 0.96   < > 1.02  --  1.06 1.07 0.90 0.93   CALCIUM 9.8 9.5   < > 10.0  --  9.8 9.2 9.1 9.1  ALKPHOS 147* 142*  --   --   --   --  182*  --   --   AST 89* 97*  --   --   --   --  86*  --   --   ALT 32 36  --   --   --   --  40  --   --   ALBUMIN 2.5* 2.6*  --   --   --   --  2.7*  --   --    < > = values in this interval not displayed.      Results/Tests Pending at Time of Discharge: fungus culture with stain  Discharge Medications:  Allergies as of 10/26/2020       Reactions   Bactrim [sulfamethoxazole-trimethoprim] Other (See Comments)   Patient was trialed on DS TIW and SS daily for PJP prophylaxis and developed hyperkalemia and increased Scr        Medication List     STOP taking these medications    atovaquone 750 MG/5ML suspension Commonly known as: MEPRON   ethambutol 400 MG tablet Commonly known as: MYAMBUTOL   metoprolol tartrate 25 MG tablet Commonly known as: LOPRESSOR   pravastatin 20 MG tablet Commonly known as: PRAVACHOL   rifabutin 150 MG capsule Commonly known as: MYCOBUTIN       TAKE these medications    acetaminophen 500 MG tablet Commonly known as: TYLENOL Take 500 mg by mouth every 6 (six) hours as needed for moderate pain or headache.   Biktarvy 50-200-25 MG Tabs tablet Generic drug: bictegravir-emtricitabine-tenofovir AF Take 1 tablet by mouth daily.   CertaVite/Antioxidants Tabs Take 1 tablet by mouth daily. Start taking on: October 27, 2020   feeding supplement (NEPRO CARB STEADY) Liqd Take 237 mLs by mouth 3 (three) times daily with meals.   fluconazole 200 MG tablet Commonly known as: DIFLUCAN Take 2 tablets (400 mg total) by mouth daily.   fluticasone 50 MCG/ACT nasal spray Commonly known as: FLONASE Place 1 spray into both nostrils as needed for allergies or rhinitis.   folic acid 1 MG tablet Commonly known as: FOLVITE Take 1 tablet (1 mg total) by mouth daily. Start taking on: October 27, 2020   ibuprofen 200 MG tablet Commonly known as: ADVIL Take  200 mg by mouth every 6 (six) hours as needed for headache or moderate pain.   sodium chloride 1 g tablet Take 3 tablets (3 g total) by mouth 3 (three) times daily with meals.   sulfamethoxazole-trimethoprim 400-80 MG tablet Commonly known as: BACTRIM Take 1 tablet by mouth daily.   verapamil 120 MG CR tablet Commonly known as: CALAN-SR Take 1 tablet (120 mg total) by mouth daily. Start taking on: October 27, 2020  Discharge Instructions: Please refer to Patient Instructions section of EMR for full details.  Patient was counseled important signs and symptoms that should prompt return to medical care, changes in medications, dietary instructions, activity restrictions, and follow up appointments.   Follow-Up Appointments:  Follow-up Information     [place] Callas, NP. Go on 10/29/2020.   Specialty: Infectious Diseases Why: Please arrive 15 minutes early for 9 AM appointment Contact information: [place] [address] [phone]         Adrian Prows, MD. Call.   Specialty: Cardiology Contact information: [place] [address] [address]. Go on 11/01/2020.   Why: Please arrive 15 minutes early for appt at 9:50 AM Contact information: [place]        Substance abuse residential treatment. Follow up.   Why: Please contact the above listed three different agencies once you have completed your covid quarantine period on 11/02/20.  Tell them you are requesting residential substance abuse treatment and were referred to them due to have Medicaid from [facility]. If you need additional assistance please contact [place] at [facility]. [phone] [id number]. Contact information: [place], [phone].    [facility]: [phone].     [place]: [phone].        [facility] Follow up.   Why: If  you need substance abuse help in [place], please walk in at this agency and request help. Contact information: [address] [place], [place] [address] P: [phone]        Jabier Mutton, MD. Go on 12/04/2020.   Specialty: Infectious Diseases Why: Please arrive to your appointment at [place] information: [address] [place] [address] [phone]                 Gerrit Heck, MD 10/26/2020, 2:08 PM PGY-1, [place]   I was personally present and performed or re-performed the history, physical exam and medical decision making activities of this service and have verified that the service and findings are accurately documented in the resident's note. Please also see attending's attestation and notes.  Donney Dice, DO                  10/26/2020, 2:46 PM  PGY-2, [place]

## 2020-10-25 NOTE — Progress Notes (Signed)
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 He has stable chronic dry cough without worsening dypsnea  His serum crypto ag is 320. Repeat cd4/hiv vl in process  Patient initially refused LP but now agreeable. I discussed with him need to r/o active culturable cryptococcus involvement of the brain. If that is the case will need to reinduce treatment and defer ART. If no culturable crypto agent, can continue with fluconazole. Would then perhaps also start ART  Other oi consideration. Cxr stable persistent cystic/nodular chagnes left upper lobe without new pna. Agree less likely to represent active NTM lung process or new pneumocystis process. Previous MAC likely colonization.   Unclear covid is incidental finding. He has chronic cough unchanged and not on o2 supplement. Cycle threshold 22.5 on 9/09 and 23 on 9/17, so to be kept in 21 day isolation  Histo/blasto serology negative  LP done -- wbc < 5 and normal protein glucose on 9/11. Fungal cx negative  Lab Results  Component Value Date   CD4TCELL 4 (L) 10/15/2020   CD4TABS <35 (L) 10/15/2020   Lab Results  Component Value Date   HIV1RNAQUANT 25,300 03/27/2020   HIV  genotype 03/2020 off meds x2 years no nrti mutation; sensitive to all integrase strand inhibitor  --------------- 9/22 assessment Given persistent negative csf fungal cx at 10 days (although cx might take longer to grow up to 4-6 weeks), and bland csf chemistry/cell count, reasonable to transition him to consolidation therapy fluconazole 400 mg daily  Will also start ART for his hiv, and continue bactrim prophy  Discussed with him IRIS possible in the next 2-6 weeks   PLAN: Start biktarvy Decrease fluconazole to 400 mg po daily Continue bactrim pjp prophy (has high potassium level on DS tiw, so changed to ss daily and doing ok) Await transition to inpatient rehab pending covid isolation removal (21 days from 9/09) Hopefully SW/CM could assist him with new housing accomodation; await drug rehab dispo at this time [facility] to help with hiv social support Rescheduled outpatient ID clinic to 10/12 @ 315 at the [place] [facility]   I spent more than 35 minute reviewing data/chart, and coordinating care and >50% direct face to face time providing counseling/discussing diagnostics/treatment plan with patient     Active Problems:   Meningitis, cryptococcal ([place])   AIDS (acquired immune deficiency syndrome) ([place])   Paroxysmal SVT (supraventricular tachycardia) ([place])   Mycobacterium avium complex ([place])   COVID-19   COVID-19 virus infection   Community acquired pneumonia   Nonadherence to medical treatment   HIV infection ([place])   Chronic low back pain   Allergies  Allergen Reactions   Bactrim [Sulfamethoxazole-Trimethoprim] Other (See Comments)  Patient was trialed on DS TIW and SS daily for PJP prophylaxis and developed hyperkalemia and increased Scr    Scheduled Meds:  bictegravir-emtricitabine-tenofovir AF  1 tablet Oral Daily   dextromethorphan-guaiFENesin  1 tablet Oral BID   enoxaparin (LOVENOX) injection  40 mg Subcutaneous Q24H   feeding  supplement (NEPRO CARB STEADY)  237 mL Oral TID WC   [START ON 10/26/2020] fluconazole  400 mg Oral Daily   folic acid  1 mg Oral Daily   multivitamin with minerals  1 tablet Oral Daily   sodium chloride  3 g Oral TID WC   sulfamethoxazole-trimethoprim  1 tablet Oral Daily   verapamil  120 mg Oral Daily   Continuous Infusions:   PRN Meds:.acetaminophen, fluticasone, hydrocortisone cream, lidocaine (PF), polyvinyl alcohol   SUBJECTIVE: No n/v/diarrhea/headache Csf fungal cx negative  Await discharge to go to rehab  Review of Systems: ROS All other ROS was negative, except mentioned above     OBJECTIVE: Vitals:   10/24/20 1500 10/24/20 2021 10/25/20 0500 10/25/20 1233  BP: 120/68 127/82 118/84 131/90  Pulse: 66 67 (!) 56 76  Resp: _0 Temp: 98 F (36.7 C) 97.7 F (36.5 C) 97.7 F (36.5 C) 97.8 F (36.6 C)  TempSrc: Oral Oral Oral Oral  SpO2: 98% 97% 99% 99%  Weight:   76.2 kg   Height:       Body mass index is 22.16 kg/m.  Physical Exam General/constitutional: no distress, pleasant HEENT: Normocephalic, PER, Conj Clear, EOMI, Oropharynx clear Neck supple CV: rrr no mrg Lungs: clear to auscultation, normal respiratory effort Abd: Soft, Nontender Ext: no edema Skin: No Rash Neuro: nonfocal MSK: no peripheral joint swelling/tenderness/warmth; back spines nontender       Lab Results Lab Results  Component Value Date   WBC 4.1 10/25/2020   HGB 12.4 (L) 10/25/2020   HCT 36.7 (L) 10/25/2020   MCV 100.0 10/25/2020   PLT 333 10/25/2020    Lab Results  Component Value Date   CREATININE 0.90 10/25/2020   BUN 19 10/25/2020   NA 128 (L) 10/25/2020   K 4.6 10/25/2020   CL 100 10/25/2020   CO2 21 (L) 10/25/2020    Lab Results  Component Value Date   ALT 40 10/24/2020   AST 86 (H) 10/24/2020   ALKPHOS 182 (H) 10/24/2020   BILITOT 0.3 10/24/2020      Microbiology: Recent Results (from the past 240 hour(s))  Fungus Culture With Stain      Status: None (Preliminary result)   Collection Time: 10/15/20  4:18 PM  Result Value Ref Range Status   Fungus Stain Final report  Final    Comment: (NOTE) Performed At: [facility] [address] [place], [place] [id number] [name] MD JQ:[id number]    Fungus (Mycology) Culture PENDING  Incomplete   Fungal Source CSF  Final    Comment: Performed at [facility], [place] [address] [place], [place] [address]  Fungus Culture Result     Status: None   Collection Time: 10/15/20  4:18 PM  Result Value Ref Range Status   Result 1 Comment  Final    Comment: (NOTE) KOH/Calcofluor preparation:  no fungus observed. Performed At: [facility] [place], [place] [id number] [name] MD ST:[id number]   CSF culture w Stat Gram Stain     Status: None   Collection Time: 10/15/20  4:21 PM   Specimen: CSF; Cerebrospinal Fluid  Result Value Ref  Range Status   Specimen Description CSF  Final   Special Requests NONE  Final   Gram Stain   Final    WBC PRESENT, PREDOMINANTLY MONONUCLEAR NO ORGANISMS SEEN CYTOSPIN SMEAR    Culture   Final    NO GROWTH 3 DAYS Performed at [facility], [address] [place], [place] [address]    Report Status 10/18/2020 FINAL  Final  SARS CORONAVIRUS 2 (TAT 6-24 HRS) Nasopharyngeal Nasopharyngeal Swab     Status: Abnormal   Collection Time: 10/19/20 11:03 AM   Specimen: Nasopharyngeal Swab  Result Value Ref Range Status   SARS Coronavirus 2 POSITIVE (A) NEGATIVE Final    Comment: (NOTE) SARS-CoV-2 target nucleic acids are DETECTED.  The SARS-CoV-2 RNA is generally detectable in upper and lower respiratory specimens during the acute phase of infection. Positive results are indicative of the presence of SARS-CoV-2 RNA. Clinical correlation with patient history and other diagnostic information is  necessary to determine patient infection status. Positive results do not rule out bacterial infection or  co-infection with other viruses.  The expected result is Negative.  Fact Sheet for Patients: SugarRoll.be  Fact Sheet for Healthcare Providers: https://www.woods-mathews.com/  This test is not yet approved or cleared by the [place] FDA and  has been authorized for detection and/or diagnosis of SARS-CoV-2 by FDA under an Emergency Use Authorization (EUA). This EUA will remain  in effect (meaning this test can be used) for the duration of the COVID-19 declaration under Section 564(b)(1) of the Act, 21 U. S.C. section 360bbb-3(b)(1), unless the authorization is terminated or revoked sooner.   Performed at [facility], [place] [address], [place], [place] [address]   Resp Panel by RT-PCR (Flu A&B, Covid) Nasopharyngeal Swab     Status: Abnormal   Collection Time: 10/20/20 11:28 AM   Specimen: Nasopharyngeal Swab; Nasopharyngeal(NP) swabs in vial transport medium  Result Value Ref Range Status   SARS Coronavirus 2 by RT PCR POSITIVE (A) NEGATIVE Final    Comment: RESULT CALLED TO, READ BACK BY AND VERIFIED WITH: Hilton Cork RN, AT 1332 10/20/20 D. VANHOOK (NOTE) SARS-CoV-2 target nucleic acids are DETECTED.  The SARS-CoV-2 RNA is generally detectable in upper respiratory specimens during the acute phase of infection. Positive results are indicative of the presence of the identified virus, but do not rule out bacterial infection or co-infection with other pathogens not detected by the test. Clinical correlation with patient history and other diagnostic information is necessary to determine patient infection status. The expected result is Negative.  Fact Sheet for Patients: EntrepreneurPulse.com.au  Fact Sheet for Healthcare Providers: IncredibleEmployment.be  This test is not yet approved or cleared by the [place] FDA and  has been authorized for detection and/or diagnosis of SARS-CoV-2  by FDA under an Emergency Use Authorization (EUA).  This EUA will remain in effect (meaning this tes t can be used) for the duration of  the COVID-19 declaration under Section 564(b)(1) of the Act, 21 U.S.C. section 360bbb-3(b)(1), unless the authorization is terminated or revoked sooner.     Influenza A by PCR NEGATIVE NEGATIVE Final   Influenza B by PCR NEGATIVE NEGATIVE Final    Comment: (NOTE) The Xpert Xpress SARS-CoV-2/FLU/RSV plus assay is intended as an aid in the diagnosis of influenza from Nasopharyngeal swab specimens and should not be used as a sole basis for treatment. Nasal washings and aspirates are unacceptable for Xpert Xpress SARS-CoV-2/FLU/RSV testing.  Fact Sheet for Patients: EntrepreneurPulse.com.au  Fact  Sheet for Healthcare Providers: IncredibleEmployment.be  This test is not yet approved or cleared by the [place] and has been authorized for detection and/or diagnosis of SARS-CoV-2 by FDA under an Emergency Use Authorization (EUA). This EUA will remain in effect (meaning this test can be used) for the duration of the COVID-19 declaration under Section 564(b)(1) of the Act, 21 U.S.C. section 360bbb-3(b)(1), unless the authorization is terminated or revoked.  Performed at [facility], [place] [address] [place], [place] [address]   Urine Culture     Status: None (Preliminary result)   Collection Time: 10/24/20  2:21 PM   Specimen: Urine, Clean Catch  Result Value Ref Range Status   Specimen Description URINE, CLEAN CATCH  Final   Special Requests NONE  Final   Culture   Final    CULTURE REINCUBATED FOR BETTER GROWTH Performed at [facility], [place] [address], [place], [place] [address]    Report Status PENDING  Incomplete     Serology:   Imaging: If present, new imagings (plain films, ct scans, and mri) have been personally visualized and interpreted; radiology reports have been reviewed.  Decision making incorporated into the Impression / Recommendations  9/10 mri brain 1. No evidence of acute intracranial abnormality on this motion limited study. 2. Similar trace bilateral anterior frontal convexity subdural hematomas. 3. Paranasal sinus disease with air-fluid level in the left maxillary sinus.   9/09 cxr 1. Persistent cystic and nodular opacities within the left upper lobe. As described on previous CT, these findings are favored to represent sequelae of atypical infection. 2. No acute superimposed airspace consolidation.  Jabier Mutton, [place] [facility] for Infectious [place] [phone] pager    10/25/2020, 1:27 PM

## 2020-10-25 NOTE — Progress Notes (Signed)
Family Medicine Teaching Service Daily Progress Note Intern Pager: [phone]  Patient name: Ricky [name] Medical record number: [id number] Date of birth: [birth year] Age: 65 y.o. Gender: male  Primary Care Provider: Pcp, No Consultants: ID, neuro cards signed off Code Status: Full  Pt Overview and Major Events to Date:  9/9 admitted  9/10 cefepime d/c'd, MRI no acute abnorm w chronic subdural hematomas, left maxillary sinus fluid level 9/11 refused LP started fluconazole and atovaquone 9/12 LP performed  9/13 suspected spinal HA switch to bactrim for PJP coverage 9/14 blood administered 9/19 21 day quarantine   Assessment and Plan:  65 year old man presenting with cough fever and chest pain with PMH of HIV/AIDS, MAC, SVT, s/p unsuccessful ablation, CKD, HTN, HLD, protein calorie malnutrition   Headache (resolved), Covid 19, HIV/AIDs with immunocompromised state and Hx of Cryptococcal meningitis  Complains of a little bit of pain behind eyes that resolves with tylenol, eye drops and mucinex. -ID consulted appreciate recs bactrim for PJP coverage, fluconazole -f/u fungal cx in 2 weeks -mucinex -flonase prn -airborne/contact precautions -f/u CT in 3 months -covid quarantine 21 days 9/30  Urinary Frequency Still having frequency -f/u urine culture  Chronic hyponatremia/Dizziness 128 from 127 yesterday -Salt 3g TID today  SVT/Sinus bradycardia -verapamil  Hyperkalemia -K 4.6 today -Nepro -monitor cbc  Polysubstance abuse  Use in sister's home, awaiting approval for rehab  FEN/GI: Regular  PPx: Lovenox Dispo:Medically stable, however still in quarantine and awaiting rehab  Subjective:  Dizziness improved on standing, no headache just eyes dry sometimes.   Objective: Temp:  [97.7 F (36.5 C)-98 F (36.7 C)] 97.7 F (36.5 C) (09/22 0500) Pulse Rate:  [56-67] 56 (09/22 0500) Resp:  [16-19] 16 (09/22 0500) BP: (118-127)/(68-84) 118/84 (09/22 0500) SpO2:  [97  %-99 %] 99 % (09/22 0500) Weight:  [76.2 kg] 76.2 kg (09/22 0500) Physical Exam: General: NAD Cardiovascular: RRR No m/r/g Respiratory: CTAB no iWOB Abdomen: nondistended nontender to palpation Extremities: moves freely  Laboratory: Recent Labs  Lab 10/23/20 0117 10/24/20 0218 10/25/20 0236  WBC 3.6* 3.5* 4.1  HGB 13.5 12.4* 12.4*  HCT 40.0 37.3* 36.7*  PLT 423* 379 333   Recent Labs  Lab 10/20/20 0143 10/21/20 0155 10/21/20 2211 10/23/20 0117 10/24/20 0218 10/25/20 0236  NA 126* 128*   < > 126* 127* 128*  K 4.9 5.3*   < > 4.8 5.1 4.6  CL 94* 95*   < > 93* 97* 100  CO2 20* 24   < > 24 22 21*  BUN 15 16   < > _0 CREATININE 0.92 0.96   < > 1.06 1.07 0.90  CALCIUM 9.8 9.5   < > 9.8 9.2 9.1  PROT 7.8 8.4*  --   --  8.2*  --   BILITOT 1.2 0.9  --   --  0.3  --   ALKPHOS 147* 142*  --   --  182*  --   ALT 32 36  --   --  40  --   AST 89* 97*  --   --  86*  --   GLUCOSE 138* 137*   < > 105* 136* 98   < > = values in this interval not displayed.    Imaging/Diagnostic Tests:   Gerrit Heck, MD 10/25/2020, 10:50 AM PGY-1, [place] Intern pager: [phone], text pages welcome

## 2020-10-25 NOTE — Plan of Care (Signed)

## 2020-10-25 NOTE — TOC Progression Note (Signed)
Transition of Care [facility]) - Progression Note    Patient Details  Name: RAN TULLIS MRN: [id number] Date of Birth: Jan 22, 1956  Transition of Care [facility]) CM/SW Contact  Joanne Chars, LCSW Phone Number: 10/25/2020, 2:58 PM  Clinical Narrative:   CSW received call back from [place] at [facility].  She provided 3 [name] options for residential treatment: Candace [facility], [phone].  CSW spoke with admissions.  They require a CCA be completed prior to pt being admitted,  This would have to be done by an outpt agency and pt would then be referred to the residential program.  They do not accept pts with covid.  Pt would need to be through quarantine prior to admission.   [facility]: [phone].  CSW LM with admissions.  [place]: [phone].  CSW LM with admissions.          Expected Discharge Plan and Services                                                 Social Determinants of Health (SDOH) Interventions    Readmission Risk Interventions Readmission Risk Prevention Plan 04/25/2020  Transportation Screening Complete  PCP or Specialist Appt within 3-5 Days Complete  HRI or [place] Complete  Social Work Consult for [place] Planning/Counseling Complete  Palliative Care Screening Not Applicable  Medication Review Press photographer) Complete  Some recent data might be hidden

## 2020-10-26 ENCOUNTER — Other Ambulatory Visit (HOSPITAL_COMMUNITY): Payer: Self-pay

## 2020-10-26 LAB — BASIC METABOLIC PANEL
Anion gap: 7 (ref 5–15)
BUN: 19 mg/dL (ref 8–23)
CO2: 21 mmol/L — ABNORMAL LOW (ref 22–32)
Calcium: 9.1 mg/dL (ref 8.9–10.3)
Chloride: 101 mmol/L (ref 98–111)
Creatinine, Ser: 0.93 mg/dL (ref 0.61–1.24)
GFR, Estimated: >60 mL/min (ref >60)
Glucose, Bld: 129 mg/dL — ABNORMAL HIGH (ref 70–99)
Potassium: 4.8 mmol/L (ref 3.5–5.1)
Sodium: 129 mmol/L — ABNORMAL LOW (ref 135–145)

## 2020-10-26 LAB — URINE CULTURE: Culture: 20000 — AB

## 2020-10-26 LAB — CBC
HCT: 39.9 % (ref 39.0–52.0)
Hemoglobin: 13 g/dL (ref 13.0–17.0)
MCH: 33.5 pg (ref 26.0–34.0)
MCHC: 32.6 g/dL (ref 30.0–36.0)
MCV: 102.8 fL — ABNORMAL HIGH (ref 80.0–100.0)
Platelets: 361 10*3/uL (ref 150–400)
RBC: 3.88 MIL/uL — ABNORMAL LOW (ref 4.22–5.81)
RDW: 11.8 % (ref 11.5–15.5)
WBC: 3.5 10*3/uL — ABNORMAL LOW (ref 4.0–10.5)
nRBC: 0 % (ref 0.0–0.2)

## 2020-10-26 MED ORDER — SODIUM CHLORIDE 1 G PO TABS
3.0000 g | ORAL_TABLET | Freq: Three times a day (TID) | ORAL | 0 refills | Status: AC
  Filled 2020-10-26: qty 270, 30d supply, fill #0

## 2020-10-26 MED ORDER — FLUTICASONE PROPIONATE 50 MCG/ACT NA SUSP
1.0000 | NASAL | 2 refills | Status: DC | PRN
  Filled 2020-10-26: qty 16, 30d supply, fill #0

## 2020-10-26 MED ORDER — FOLIC ACID 1 MG PO TABS
1.0000 mg | ORAL_TABLET | Freq: Every day | ORAL | 1 refills | Status: AC
  Filled 2020-10-26: qty 30, 30d supply, fill #0

## 2020-10-26 MED ORDER — CERTAVITE/ANTIOXIDANTS PO TABS
1.0000 | ORAL_TABLET | Freq: Every day | ORAL | 1 refills | Status: AC
  Filled 2020-10-26: qty 30, 30d supply, fill #0

## 2020-10-26 MED ORDER — NEPRO/CARBSTEADY PO LIQD
237.0000 mL | Freq: Three times a day (TID) | ORAL | 0 refills | Status: AC
  Filled 2020-10-26: qty 21330, 30d supply, fill #0

## 2020-10-26 MED ORDER — VERAPAMIL HCL ER 120 MG PO TBCR
120.0000 mg | EXTENDED_RELEASE_TABLET | Freq: Every day | ORAL | 1 refills | Status: DC
  Filled 2020-10-26: qty 30, 30d supply, fill #0

## 2020-10-26 NOTE — TOC Transition Note (Signed)
Transition of Care [facility]) - CM/SW Discharge Note   Patient Details  Name: Ricky [name] MRN: [id number] Date of Birth: [birth year]  Transition of Care [facility]) CM/SW Contact:  Joanne Chars, LCSW Phone Number: 10/26/2020, 1:02 PM   Clinical Narrative:   CSW attempted to speak with pt by phone about following up with residential substance abuse providers after his covid quarantine period ends on 11/02/20.  Pt became irate and said that if he does not go straight to treatment, "I'm an addict and I'm going to use."  Pt then hung up on CSW.  Contact information for multiple treatment providers placed on pt AVS.  Readmission risk items addressed.  PCP appt scheduled by provider.      Final next level of care: Home/Self Care Barriers to Discharge: No Barriers Identified   Patient Goals and CMS Choice        Discharge Placement                       Discharge Plan and Services                DME Arranged: N/A         HH Arranged: NA          Social Determinants of Health (SDOH) Interventions     Readmission Risk Interventions Readmission Risk Prevention Plan 10/26/2020 04/25/2020  Transportation Screening Complete Complete  PCP or Specialist Appt within 5-7 Days Complete -  PCP or Specialist Appt within 3-5 Days - Complete  Home Care Screening Complete -  [place] or [place] - Complete  Social Work Consult for [place] Planning/Counseling - Complete  Palliative Care Screening - Not Applicable  Medication Review Press photographer) - Complete  Some recent data might be hidden

## 2020-10-26 NOTE — Plan of Care (Signed)
  Problem: Education: Goal: Knowledge of General Education information will improve Description: Including pain rating scale, medication(s)/side effects and non-pharmacologic comfort measures Outcome: Progressing   Problem: Health Behavior/Discharge Planning: Goal: Ability to manage health-related needs will improve Outcome: Progressing   Problem: Coping: Goal: Level of anxiety will decrease Outcome: Progressing   Problem: Elimination: Goal: Will not experience complications related to bowel motility Outcome: Progressing   Problem: Pain Managment: Goal: General experience of comfort will improve 10/26/2020 0023 by Burnice Logan, LPN Outcome: Progressing 10/25/2020 2345 by Burnice Logan, LPN Outcome: Progressing   Problem: Safety: Goal: Ability to remain free from injury will improve 10/26/2020 0023 by Burnice Logan, LPN Outcome: Progressing 10/25/2020 2345 by Burnice Logan, LPN Outcome: Progressing   Problem: Skin Integrity: Goal: Risk for impaired skin integrity will decrease 10/26/2020 0023 by Burnice Logan, LPN Outcome: Progressing 10/25/2020 2345 by Burnice Logan, LPN Outcome: Progressing

## 2020-10-26 NOTE — Progress Notes (Signed)
Pt was upset that he was being Discharge fused and cursed about leaving he then told nurse to leave the room while he get hisself together. Nurse told pt to put on call bell when finished so IV could be removed and staff would transferred pt by wheelchair. Pt just left without letting nurse know with IV in place.

## 2020-10-26 NOTE — Progress Notes (Signed)
FPTS Brief Progress Note  S:Patient awake and lying comfortably in bed. No complaints.    O: BP 119/72 (BP Location: Right Arm)   Pulse 66   Temp 97.8 F (36.6 C) (Oral)   Resp 16   Ht 6\' 1"  (1.854 m)   Wt 76.2 kg   SpO2 99%   BMI 22.16 kg/m     A/P: - Orders reviewed. Labs for AM ordered, which was adjusted as needed.    Holley Bouche, MD 10/26/2020, 12:03 AM PGY-1, [facility] Night Resident  Please page [phone] with questions.

## 2020-10-26 NOTE — TOC Progression Note (Signed)
Transition of Care [facility]) - Progression Note    Patient Details  Name: Ricky [name] MRN: [id number] Date of Birth: [birth year]  Transition of Care [facility]) CM/SW Contact  Joanne Chars, LCSW Phone Number: 10/26/2020, 11:18 AM  Clinical Narrative:   CSW received call from [place] at [facility].  They did speak with pt yesterday.  Since pt has covid and is still needing quarantine, they would not be able to accept him currently.  Pt should call back once he is out of his quarantine period.         Expected Discharge Plan and Services                                                 Social Determinants of Health (SDOH) Interventions    Readmission Risk Interventions Readmission Risk Prevention Plan 04/25/2020  Transportation Screening Complete  PCP or Specialist Appt within 3-5 Days Complete  HRI or [place] Complete  Social Work Consult for [place] Planning/Counseling Complete  Palliative Care Screening Not Applicable  Medication Review Press photographer) Complete  Some recent data might be hidden

## 2020-10-29 ENCOUNTER — Ambulatory Visit: Payer: Medicaid Other | Admitting: Infectious Diseases

## 2020-10-29 ENCOUNTER — Inpatient Hospital Stay: Payer: Medicaid Other | Admitting: Infectious Diseases

## 2020-10-30 ENCOUNTER — Ambulatory Visit: Payer: Medicaid Other | Admitting: Infectious Diseases

## 2020-11-01 ENCOUNTER — Inpatient Hospital Stay: Payer: Medicaid Other

## 2020-11-14 ENCOUNTER — Inpatient Hospital Stay: Payer: Medicaid Other | Admitting: Internal Medicine

## 2020-11-15 LAB — FUNGUS CULTURE WITH STAIN

## 2020-11-15 LAB — FUNGAL ORGANISM REFLEX

## 2020-11-15 LAB — FUNGUS CULTURE RESULT

## 2020-11-30 LAB — FUNGAL ORGANISM REFLEX

## 2020-11-30 LAB — FUNGUS CULTURE RESULT

## 2020-11-30 LAB — FUNGUS CULTURE WITH STAIN

## 2020-12-03 ENCOUNTER — Telehealth: Payer: Self-pay

## 2020-12-03 ENCOUNTER — Other Ambulatory Visit (HOSPITAL_COMMUNITY): Payer: Self-pay

## 2020-12-03 NOTE — Telephone Encounter (Signed)
[facility] Patient Advocate Encounter  Insurance verification completed.    The patient is insured through [employer] and has a $4.00 copay.  We will continue to follow to see if copay assistance is needed.  Thai Burgueno, CPhT Specialty Pharmacy Patient Advocate [facility] Phone: [phone] Fax:  [phone]  

## 2020-12-04 ENCOUNTER — Ambulatory Visit: Payer: Medicaid Other | Admitting: Internal Medicine

## 2020-12-11 ENCOUNTER — Ambulatory Visit: Payer: Medicaid Other | Admitting: Internal Medicine

## 2020-12-12 ENCOUNTER — Other Ambulatory Visit (HOSPITAL_COMMUNITY): Payer: Self-pay

## 2020-12-12 IMAGING — CR DG ABDOMEN 1V
1 series · 1 of 1 positions shown · non-contrast
Comparison: Abdominal radiograph dated [DATE]

CLINICAL DATA: Abdominal pain

EXAM:
ABDOMEN - 1 VIEW

[abdomen kub]
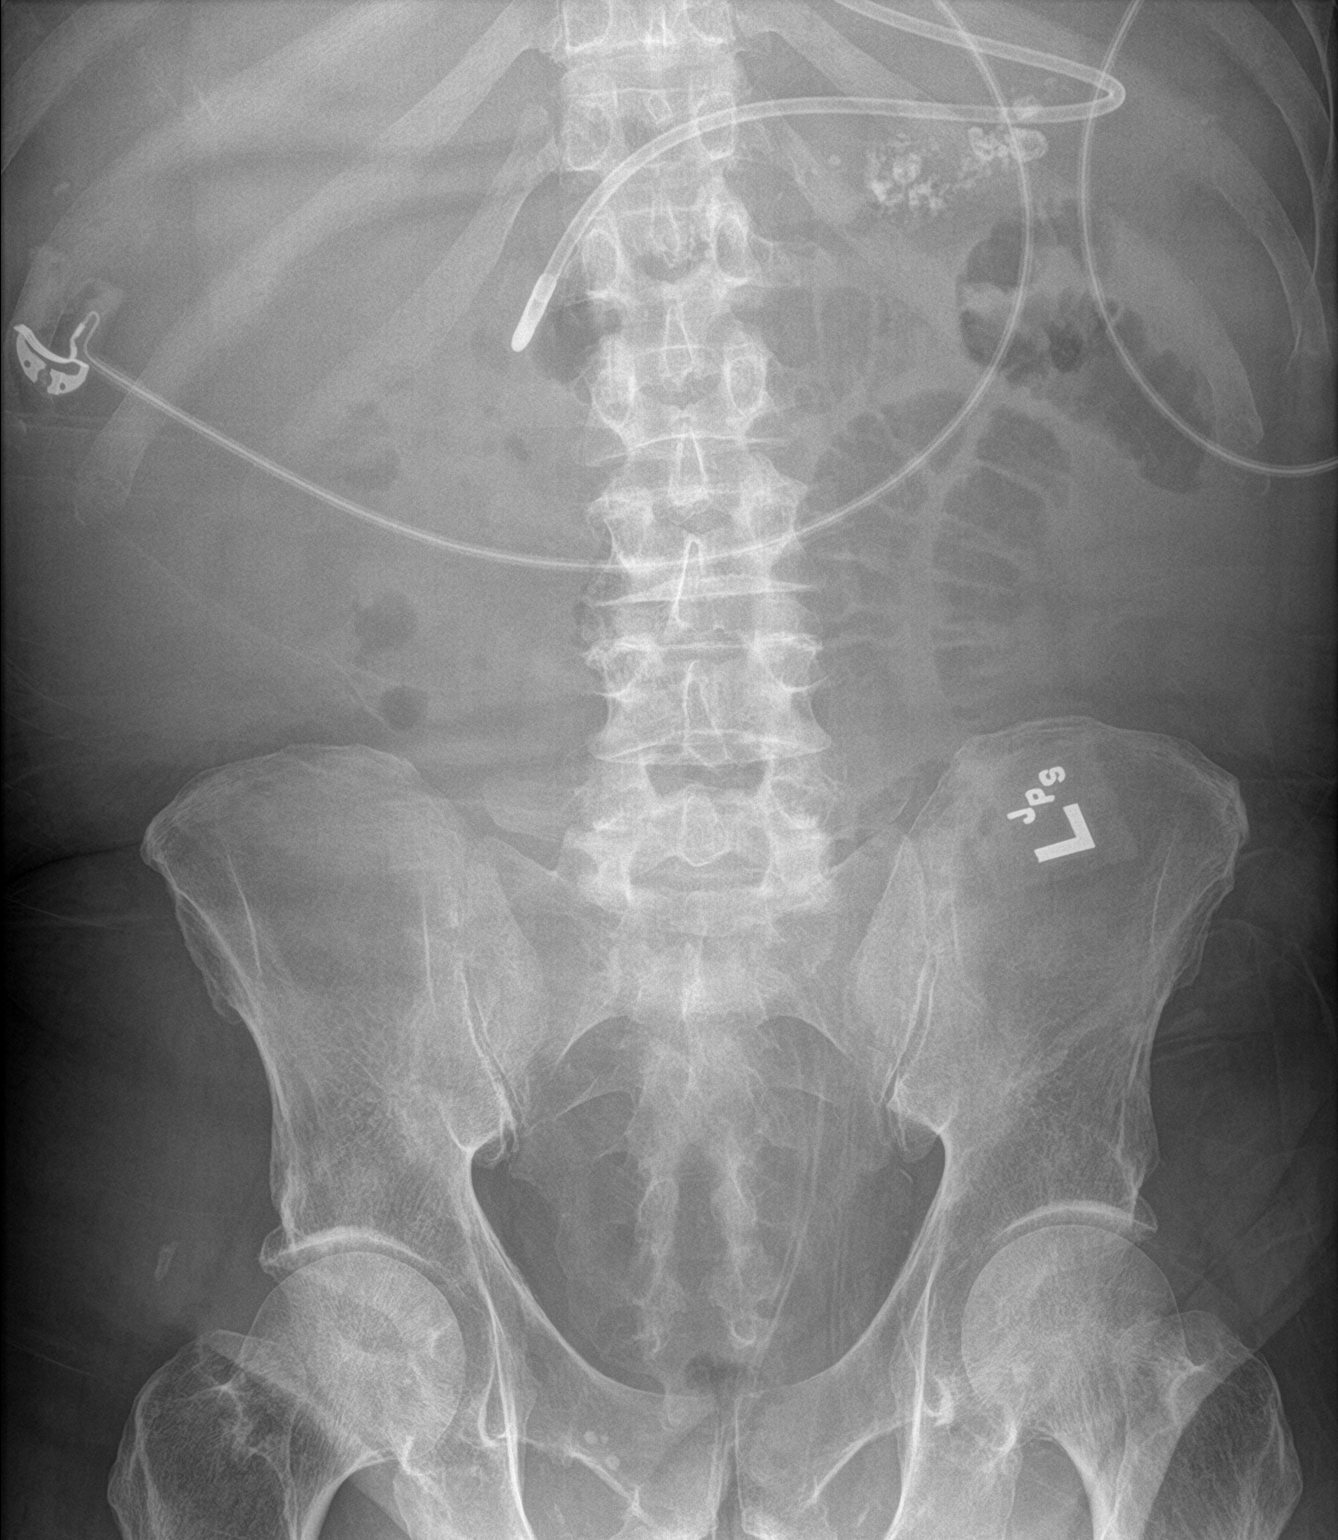

[1 of 1 positions shown; findings below may reference images not displayed]

FINDINGS: Feeding tube projects over the expected area of the distal stomach.
Mildly dilated small bowel loops with air seen in the colon. No
acute osseous abnormality.
IMPRESSION: Mildly dilated small bowel loops with air seen in the colon,
concerning for early or partial small bowel obstruction.

## 2021-01-21 ENCOUNTER — Emergency Department (HOSPITAL_COMMUNITY): Payer: Medicaid Other

## 2021-01-21 ENCOUNTER — Inpatient Hospital Stay (HOSPITAL_COMMUNITY)
Admission: EM | Admit: 2021-01-21 | Discharge: 2021-01-24 | DRG: 392 | Disposition: A | Discharge status: Home / Self Care | Payer: Medicaid Other | Attending: Student | Admitting: Student

## 2021-01-21 ENCOUNTER — Encounter (HOSPITAL_COMMUNITY): Payer: Self-pay

## 2021-01-21 ENCOUNTER — Other Ambulatory Visit: Payer: Self-pay

## 2021-01-21 DIAGNOSIS — K810 Acute cholecystitis: Secondary | ICD-10-CM | POA: Diagnosis present

## 2021-01-21 DIAGNOSIS — I10 Essential (primary) hypertension: Secondary | ICD-10-CM | POA: Diagnosis present

## 2021-01-21 DIAGNOSIS — B20 Human immunodeficiency virus [HIV] disease: Secondary | ICD-10-CM | POA: Diagnosis present

## 2021-01-21 DIAGNOSIS — F1010 Alcohol abuse, uncomplicated: Secondary | ICD-10-CM | POA: Diagnosis present

## 2021-01-21 DIAGNOSIS — E871 Hypo-osmolality and hyponatremia: Secondary | ICD-10-CM | POA: Diagnosis present

## 2021-01-21 DIAGNOSIS — R509 Fever, unspecified: Secondary | ICD-10-CM

## 2021-01-21 DIAGNOSIS — Z881 Allergy status to other antibiotic agents status: Secondary | ICD-10-CM

## 2021-01-21 DIAGNOSIS — B451 Cerebral cryptococcosis: Secondary | ICD-10-CM

## 2021-01-21 DIAGNOSIS — D72819 Decreased white blood cell count, unspecified: Secondary | ICD-10-CM | POA: Diagnosis present

## 2021-01-21 DIAGNOSIS — E8809 Other disorders of plasma-protein metabolism, not elsewhere classified: Secondary | ICD-10-CM | POA: Diagnosis present

## 2021-01-21 DIAGNOSIS — Z6822 Body mass index (BMI) 22.0-22.9, adult: Secondary | ICD-10-CM

## 2021-01-21 DIAGNOSIS — F1410 Cocaine abuse, uncomplicated: Secondary | ICD-10-CM | POA: Diagnosis present

## 2021-01-21 DIAGNOSIS — Z79899 Other long term (current) drug therapy: Secondary | ICD-10-CM

## 2021-01-21 DIAGNOSIS — E875 Hyperkalemia: Secondary | ICD-10-CM | POA: Diagnosis present

## 2021-01-21 DIAGNOSIS — Z8611 Personal history of tuberculosis: Secondary | ICD-10-CM

## 2021-01-21 DIAGNOSIS — R911 Solitary pulmonary nodule: Secondary | ICD-10-CM | POA: Diagnosis present

## 2021-01-21 DIAGNOSIS — I2510 Atherosclerotic heart disease of native coronary artery without angina pectoris: Secondary | ICD-10-CM | POA: Diagnosis present

## 2021-01-21 DIAGNOSIS — E440 Moderate protein-calorie malnutrition: Secondary | ICD-10-CM | POA: Diagnosis present

## 2021-01-21 DIAGNOSIS — Z5900 Homelessness unspecified: Secondary | ICD-10-CM

## 2021-01-21 DIAGNOSIS — K861 Other chronic pancreatitis: Secondary | ICD-10-CM | POA: Diagnosis present

## 2021-01-21 DIAGNOSIS — Z720 Tobacco use: Secondary | ICD-10-CM

## 2021-01-21 DIAGNOSIS — N390 Urinary tract infection, site not specified: Secondary | ICD-10-CM

## 2021-01-21 DIAGNOSIS — K2970 Gastritis, unspecified, without bleeding: Principal | ICD-10-CM | POA: Diagnosis present

## 2021-01-21 DIAGNOSIS — I700 Atherosclerosis of aorta: Secondary | ICD-10-CM | POA: Diagnosis present

## 2021-01-21 DIAGNOSIS — Z91199 Patient's noncompliance with other medical treatment and regimen due to unspecified reason: Secondary | ICD-10-CM

## 2021-01-21 DIAGNOSIS — R590 Localized enlarged lymph nodes: Secondary | ICD-10-CM | POA: Diagnosis present

## 2021-01-21 DIAGNOSIS — R1011 Right upper quadrant pain: Secondary | ICD-10-CM

## 2021-01-21 DIAGNOSIS — Z9114 Patient's other noncompliance with medication regimen: Secondary | ICD-10-CM

## 2021-01-21 DIAGNOSIS — Z8616 Personal history of COVID-19: Secondary | ICD-10-CM

## 2021-01-21 DIAGNOSIS — R8281 Pyuria: Secondary | ICD-10-CM | POA: Diagnosis present

## 2021-01-21 DIAGNOSIS — Z20822 Contact with and (suspected) exposure to covid-19: Secondary | ICD-10-CM | POA: Diagnosis present

## 2021-01-21 LAB — URINALYSIS, ROUTINE W REFLEX MICROSCOPIC
Glucose, UA: NEGATIVE mg/dL
Ketones, ur: NEGATIVE mg/dL
Nitrite: POSITIVE — AB
Protein, ur: NEGATIVE mg/dL
Specific Gravity, Urine: 1.02 (ref 1.005–1.030)
pH: 6 (ref 5.0–8.0)

## 2021-01-21 LAB — CBC
HCT: 35 % — ABNORMAL LOW (ref 39.0–52.0)
Hemoglobin: 11.3 g/dL — ABNORMAL LOW (ref 13.0–17.0)
MCH: 31.8 pg (ref 26.0–34.0)
MCHC: 32.3 g/dL (ref 30.0–36.0)
MCV: 98.6 fL (ref 80.0–100.0)
Platelets: 216 10*3/uL (ref 150–400)
RBC: 3.55 MIL/uL — ABNORMAL LOW (ref 4.22–5.81)
RDW: 15.5 % (ref 11.5–15.5)
WBC: 2.2 10*3/uL — ABNORMAL LOW (ref 4.0–10.5)
nRBC: 0 % (ref 0.0–0.2)

## 2021-01-21 LAB — COMPREHENSIVE METABOLIC PANEL
ALT: 12 U/L (ref 0–44)
AST: 54 U/L — ABNORMAL HIGH (ref 15–41)
Albumin: 1.8 g/dL — ABNORMAL LOW (ref 3.5–5.0)
Alkaline Phosphatase: 235 U/L — ABNORMAL HIGH (ref 38–126)
Anion gap: 8 (ref 5–15)
BUN: 5 mg/dL — ABNORMAL LOW (ref 8–23)
CO2: 23 mmol/L (ref 22–32)
Calcium: 8.3 mg/dL — ABNORMAL LOW (ref 8.9–10.3)
Chloride: 96 mmol/L — ABNORMAL LOW (ref 98–111)
Creatinine, Ser: 0.84 mg/dL (ref 0.61–1.24)
GFR, Estimated: >60 mL/min (ref >60)
Glucose, Bld: 157 mg/dL — ABNORMAL HIGH (ref 70–99)
Potassium: 4.3 mmol/L (ref 3.5–5.1)
Sodium: 127 mmol/L — ABNORMAL LOW (ref 135–145)
Total Bilirubin: 0.9 mg/dL (ref 0.3–1.2)
Total Protein: 7.3 g/dL (ref 6.5–8.1)

## 2021-01-21 LAB — URINALYSIS, MICROSCOPIC (REFLEX): WBC, UA: >50 WBC/hpf (ref 0–5)

## 2021-01-21 LAB — LIPASE, BLOOD: Lipase: 33 U/L (ref 11–51)

## 2021-01-21 IMAGING — CT CT ABD-PELV W/ CM
2 of 5 series · 14 of 46 positions shown, 16 images · IV contrast (APPLIED)
Comparison: [DATE]

CLINICAL DATA: Abdominal pain for several weeks, initial encounter

EXAM:
CT ABDOMEN AND PELVIS WITH CONTRAST
TECHNIQUE: Multidetector CT imaging of the abdomen and pelvis was performed
using the standard protocol following bolus administration of
intravenous contrast.
CONTRAST:  100mL OMNIPAQUE IOHEXOL 300 MG/ML  SOLN

[Series 3: abdomen 5.0 · axial · 0.81mm/px · z∈[+689,+1104]mm · 11 of 97 slices shown, 13 images]
[im 7/97  soft-tissue]
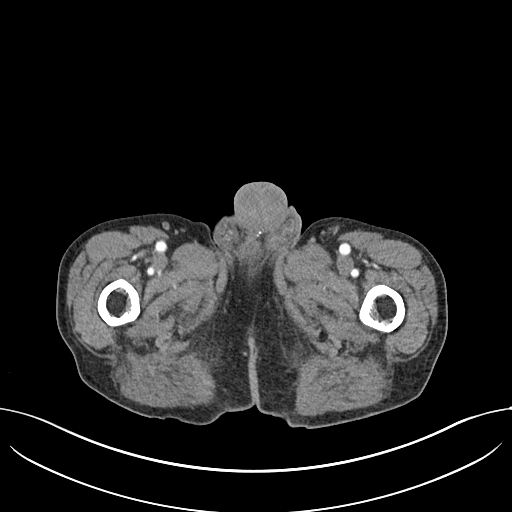
[im 7/97  bone]
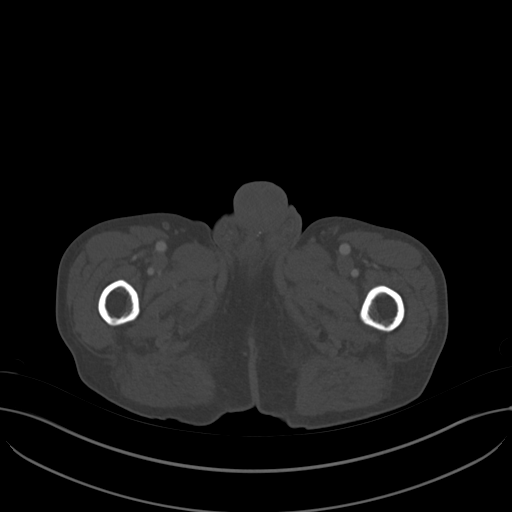
[im 14/97  soft-tissue]
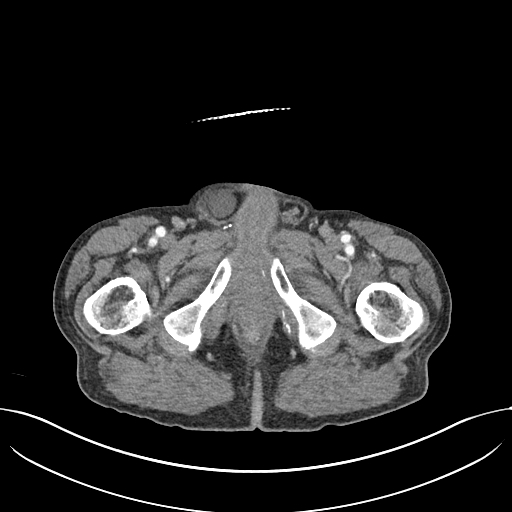
[im 21/97  soft-tissue]
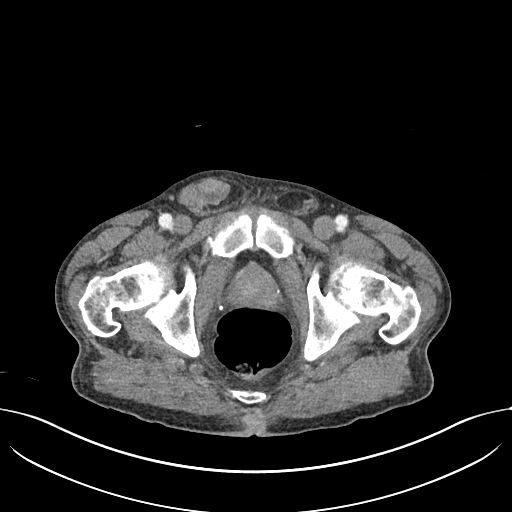
[im 35/97  soft-tissue]
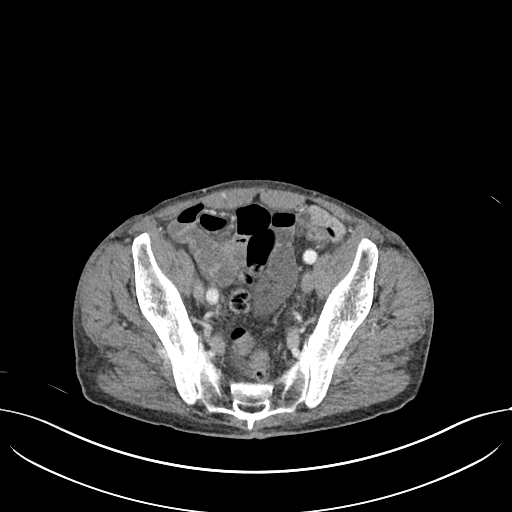
[im 42/97  soft-tissue]
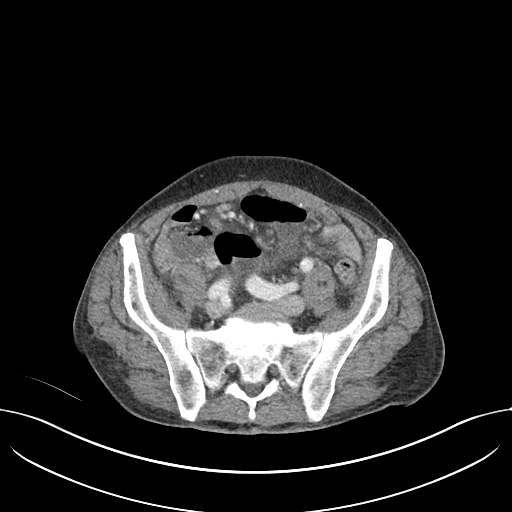
[im 49/97  soft-tissue]
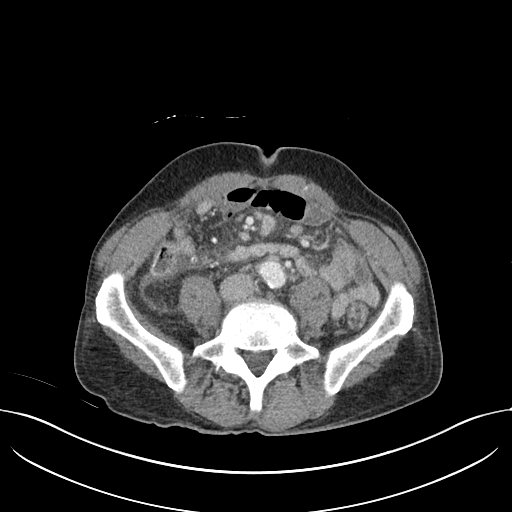
[im 55/97  soft-tissue]
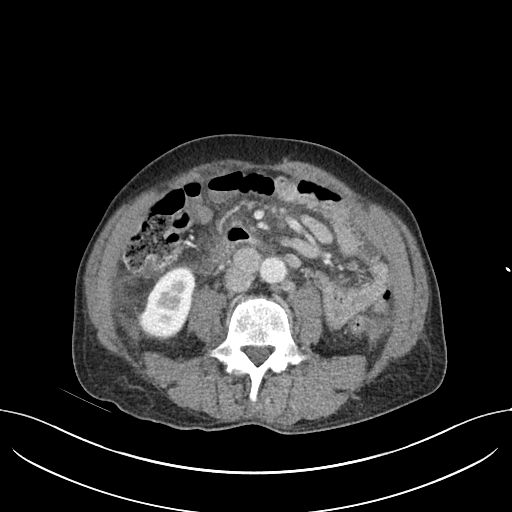
[im 62/97  soft-tissue]
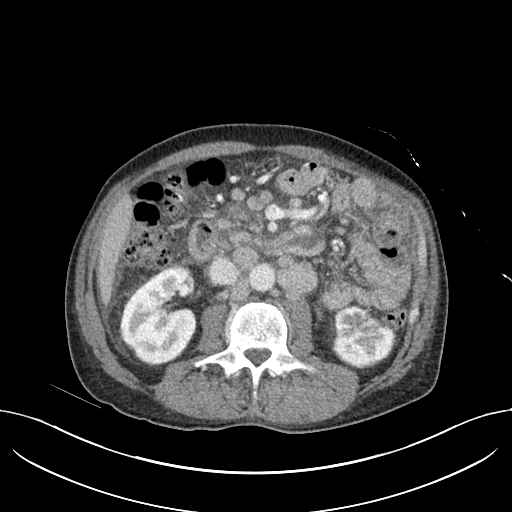
[im 76/97  soft-tissue]
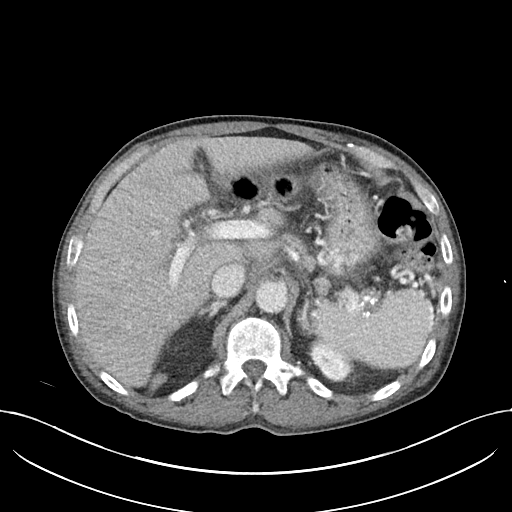
[im 76/97  bone]
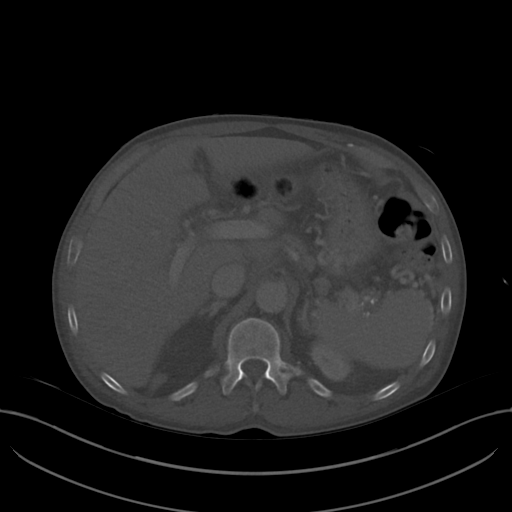
[im 83/97  soft-tissue]
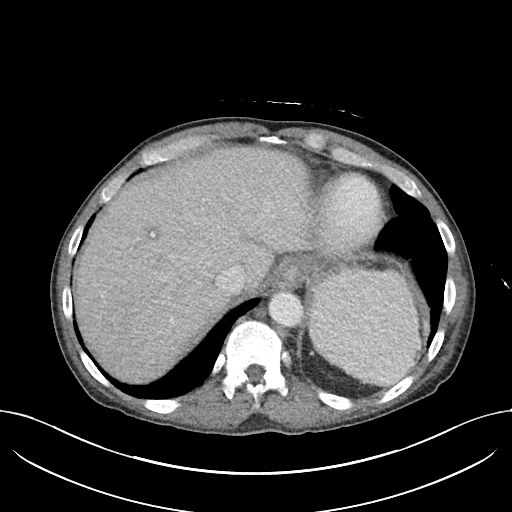
[im 90/97  soft-tissue]
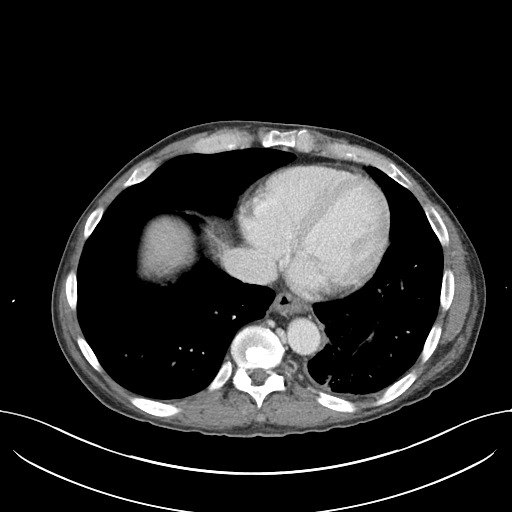

[Series 6: abdomen 3.0 mpr cor · coronal · 0.71mm/px · 3 of 88 slices shown]
[im 30/88  soft-tissue]
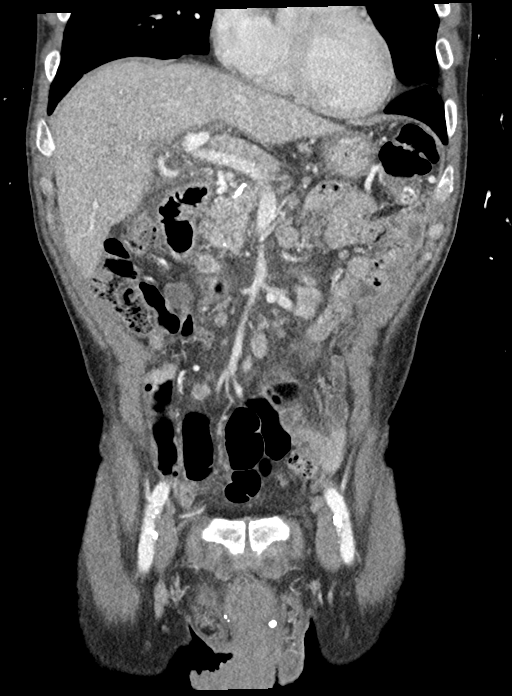
[im 39/88  soft-tissue]
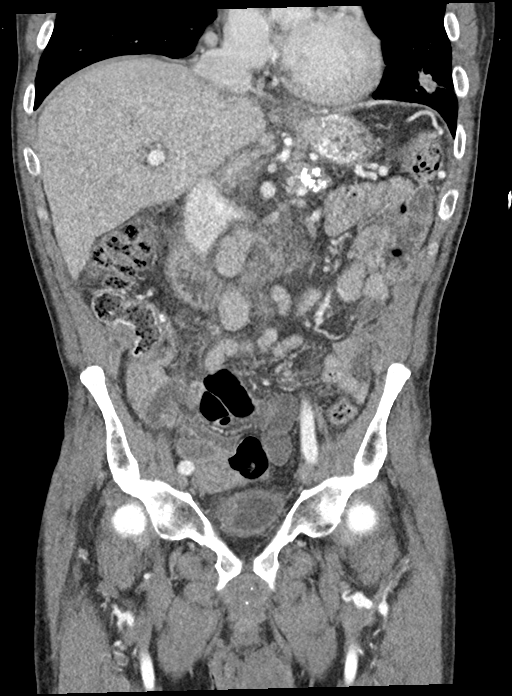
[im 49/88  soft-tissue]
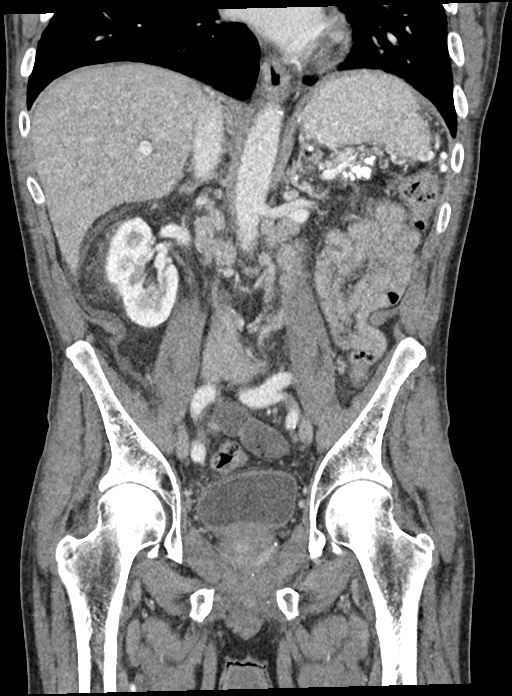

[14 of 46 positions shown; findings below may reference images not displayed]

FINDINGS: Lower chest: Lung base demonstrates some chronic infiltrative
changes in the medial lower lobes similar to that seen on the prior
exam. Emphysematous changes and changes of bronchiectasis are seen.
There is an enlarging nodule in the anterolateral aspect of the left
lower lobe measuring 1.6 x 1.4 cm. This previously measured up to 12
mm in dimension. It also has a more full rounded appearance when
compared with the prior exam. These changes are suspicious for
underlying neoplasm.

Hepatobiliary: Liver is within normal limits. Mild periportal edema
is seen. Gallbladder is well distended with some minimal
pericholecystic inflammatory change.

Pancreas: Changes of chronic pancreatitis are noted with diffuse
calcifications identified.

Spleen: Normal in size without focal abnormality.

Adrenals/Urinary Tract: Adrenal glands are within normal limits
bilaterally. Kidneys demonstrate a normal enhancement pattern
bilaterally. Normal excretion is noted on delayed images. The
bladder is partially distended.

Stomach/Bowel: Colon shows no obstructive or inflammatory changes.
The appendix is well visualized with barium within. No inflammatory
changes to suggest appendicitis are noted. Stomach is within normal
limits. No obstructive changes in the small bowel are noted. There
is herniation of at least a loop of small bowel into a right
inguinal hernia. No definitive obstructive changes are seen although
this may be related to the patient's underlying symptomatology of
pain.

Vascular/Lymphatic: Atherosclerotic calcifications are noted.
Multiple prominent lymph nodes are noted in the small bowel
mesentery. Additionally a pre pancreatic node measuring up to 15 mm
in short axis is seen. Peri aortic and intra-aortocaval lymph nodes
measuring up to 2.5 cm in short axis are seen.

Reproductive: Prostate is unremarkable.

Other: No abdominal wall hernia or abnormality. No abdominopelvic
ascites.

Musculoskeletal: No acute or significant osseous findings.
IMPRESSION: Right inguinal hernia containing a few loops of small bowel without
definitive obstructive change. This may be related to the patient's
underlying discomfort.

Enlarging soft tissue nodule in the left lower lobe measuring up to
1.6 cm. Although prior chest CTs show some inflammatory changes this
must be viewed with some suspicion for underlying neoplasm given its
increase in size and bulk when compared with the prior study.
Referral to multi disciplinary review board may be helpful to guide
workup.

Multiple mesenteric lymph nodes, pre pancreatic lymph node and
multiple retroperitoneal enlarged lymph nodes are noted. Further
workup of these nodes is recommended as well. These may be related
to patient's underlying HIV status.

Suggestion of wall thickening and mild Peri cholecystic fluid. Right
upper quadrant ultrasound may be helpful for further evaluation.

## 2021-01-21 IMAGING — US US ABDOMEN LIMITED
1 series · 14 of 25 positions shown · non-contrast
Comparison: [DATE].

CLINICAL DATA: Right upper quadrant pain.

EXAM:
ULTRASOUND ABDOMEN LIMITED RIGHT UPPER QUADRANT

[Series 1: us abdomen limited ruq (liver/gb) · 31 acquisitions, 14 frames shown]
[im 1/31]
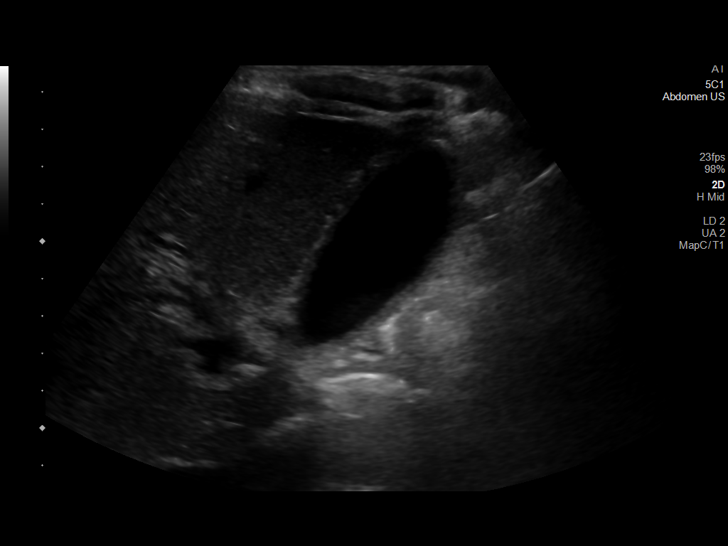
[im 3/31]
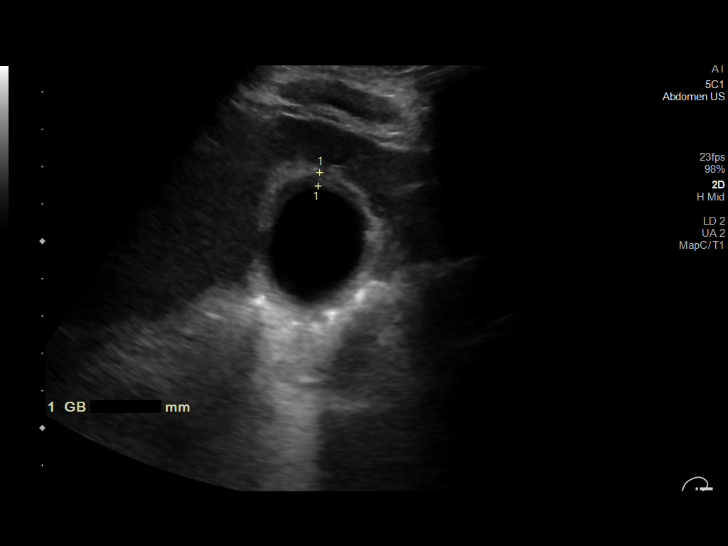
[im 6/31]
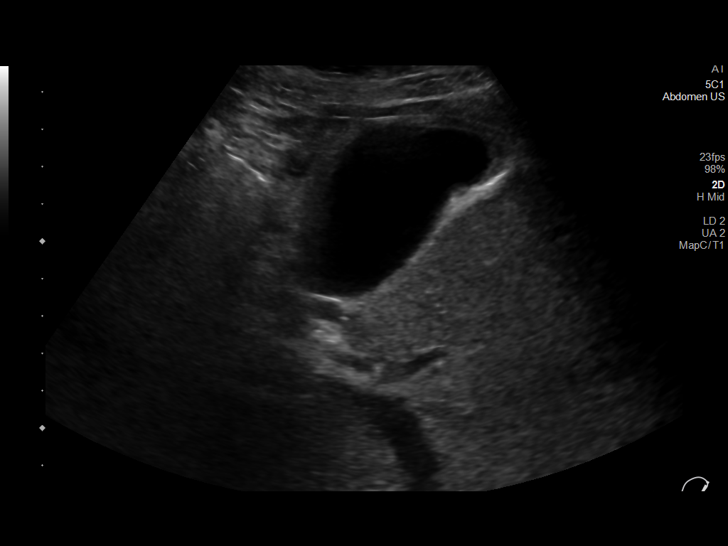
[im 8/31]
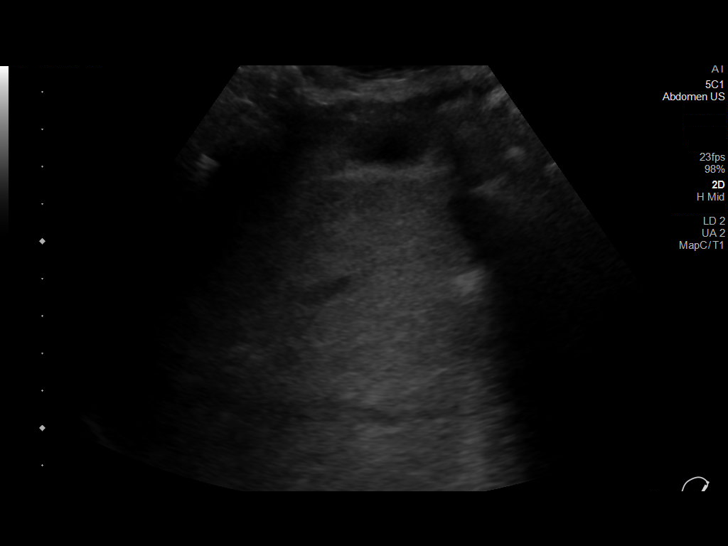
[im 11/31]
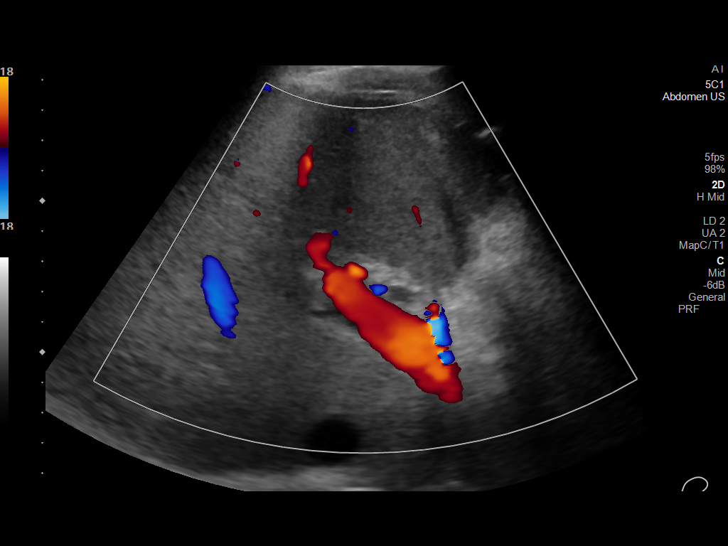
[im 12/31]
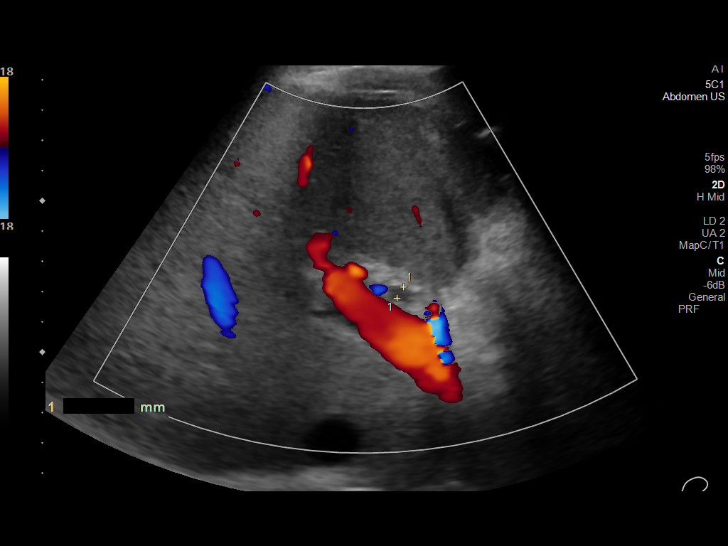
[im 14/31]
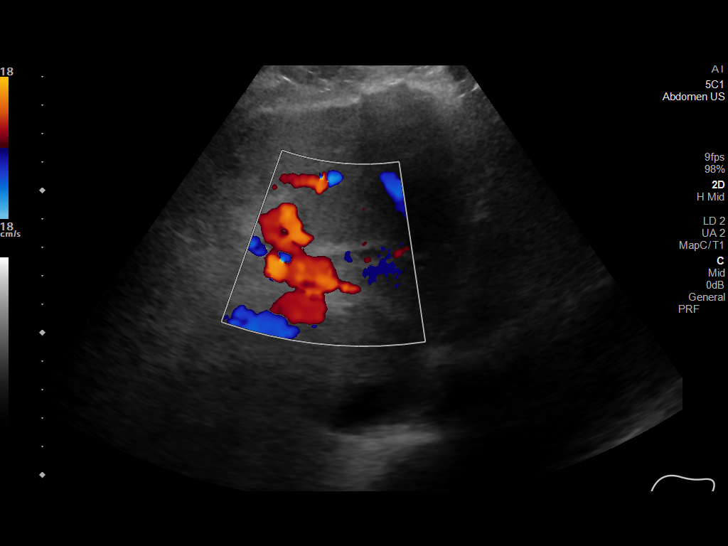
[im 17/31]
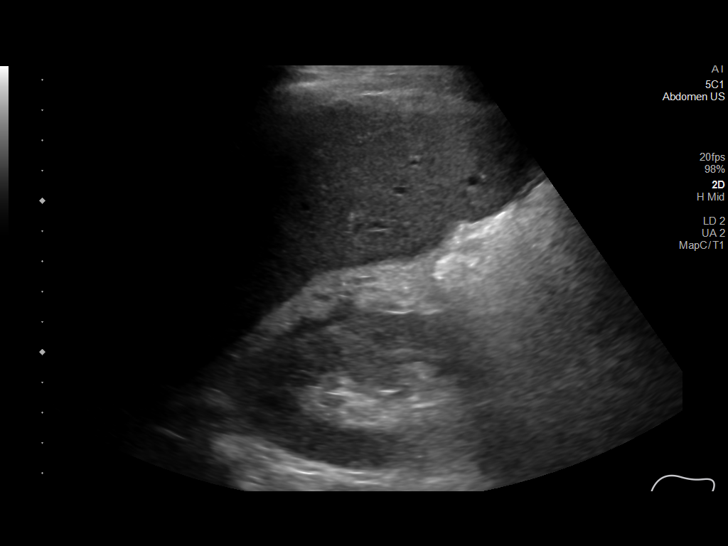
[im 19/31]
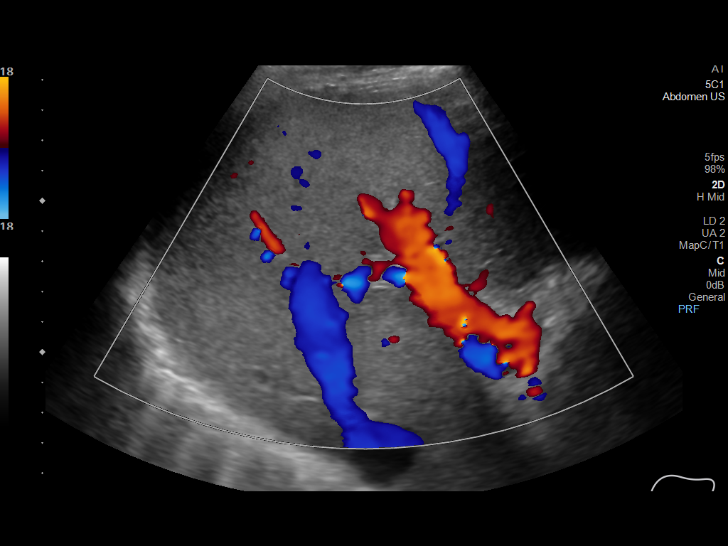
[im 21/31]
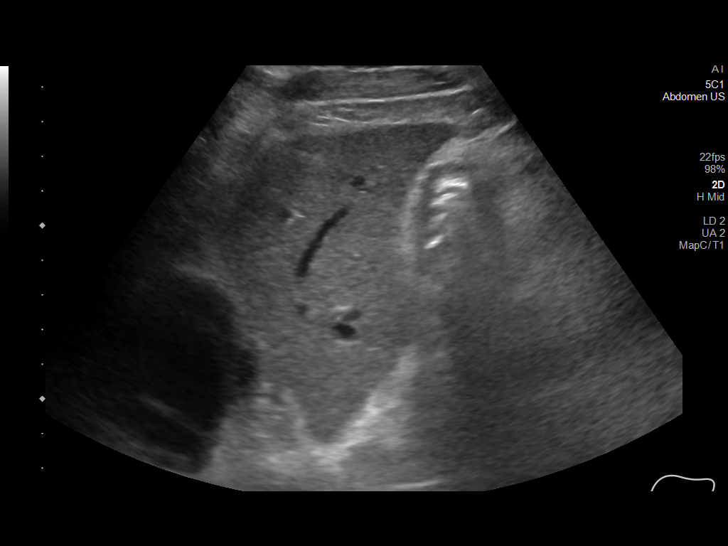
[im 23/31]
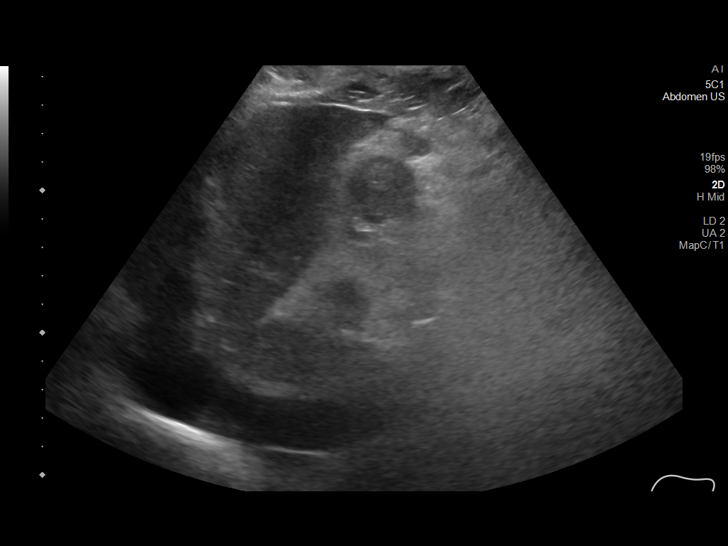
[im 26/31]
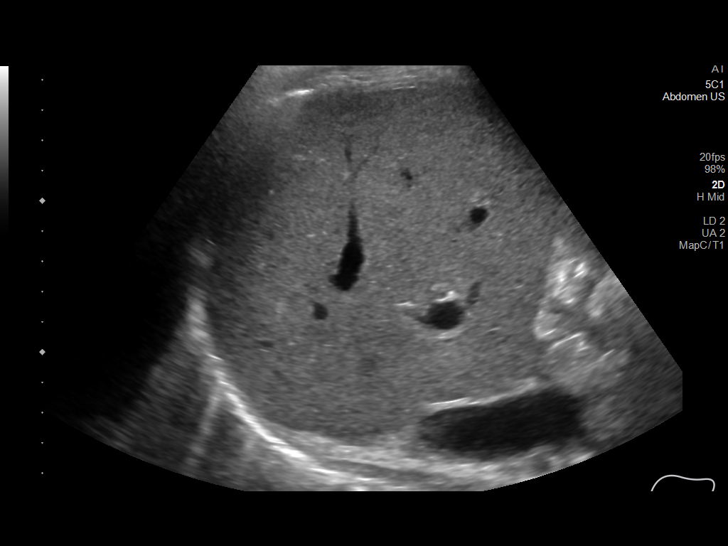
[im 28/31]
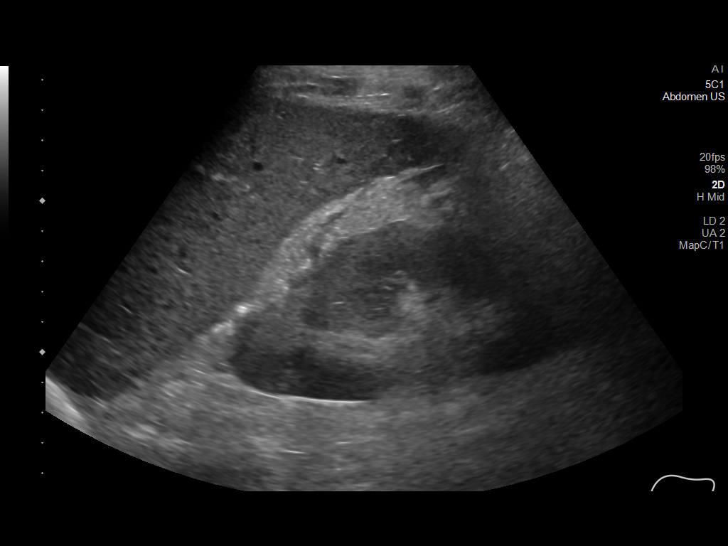
[im 31/31]
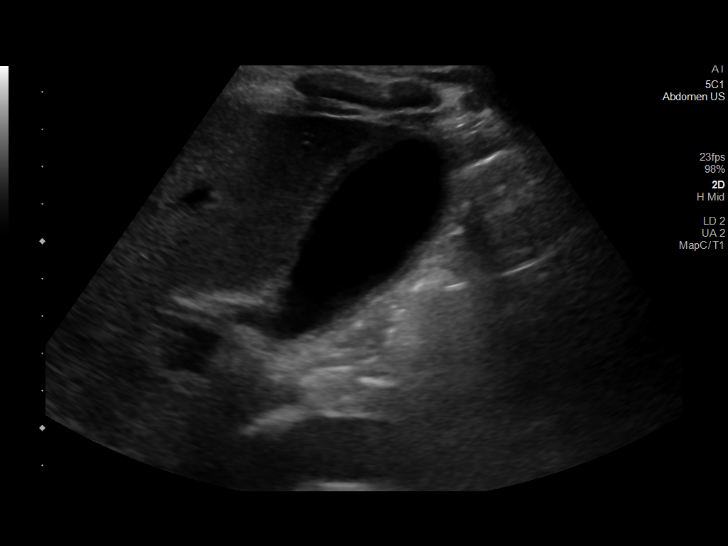

[14 of 25 positions shown; findings below may reference images not displayed]

FINDINGS: Gallbladder:

No cholelithiasis. There is mild gallbladder wall thickening with
pericholecystic fluid. No sonographic Murphy sign noted by
sonographer.

Common bile duct:

Diameter: 4.3 mm.

Liver:

No focal lesion identified. Within normal limits in parenchymal
echogenicity. Portal vein is patent on color Doppler imaging with
normal direction of blood flow towards the liver.

Other: Small amount of ascites adjacent to the gallbladder.
IMPRESSION: 1. Mild gallbladder wall thickening and pericholecystic fluid. No
sonographic Murphy sign. Findings may be related to ascites or local
inflammatory changes.
2. Mild ascites at the gallbladder fossa.

## 2021-01-21 IMAGING — CR DG CHEST 2V
2 series · 2 of 2 positions shown · non-contrast
Comparison: [DATE]

CLINICAL DATA: Productive cough.

EXAM:
CHEST - 2 VIEW

[chest pa]
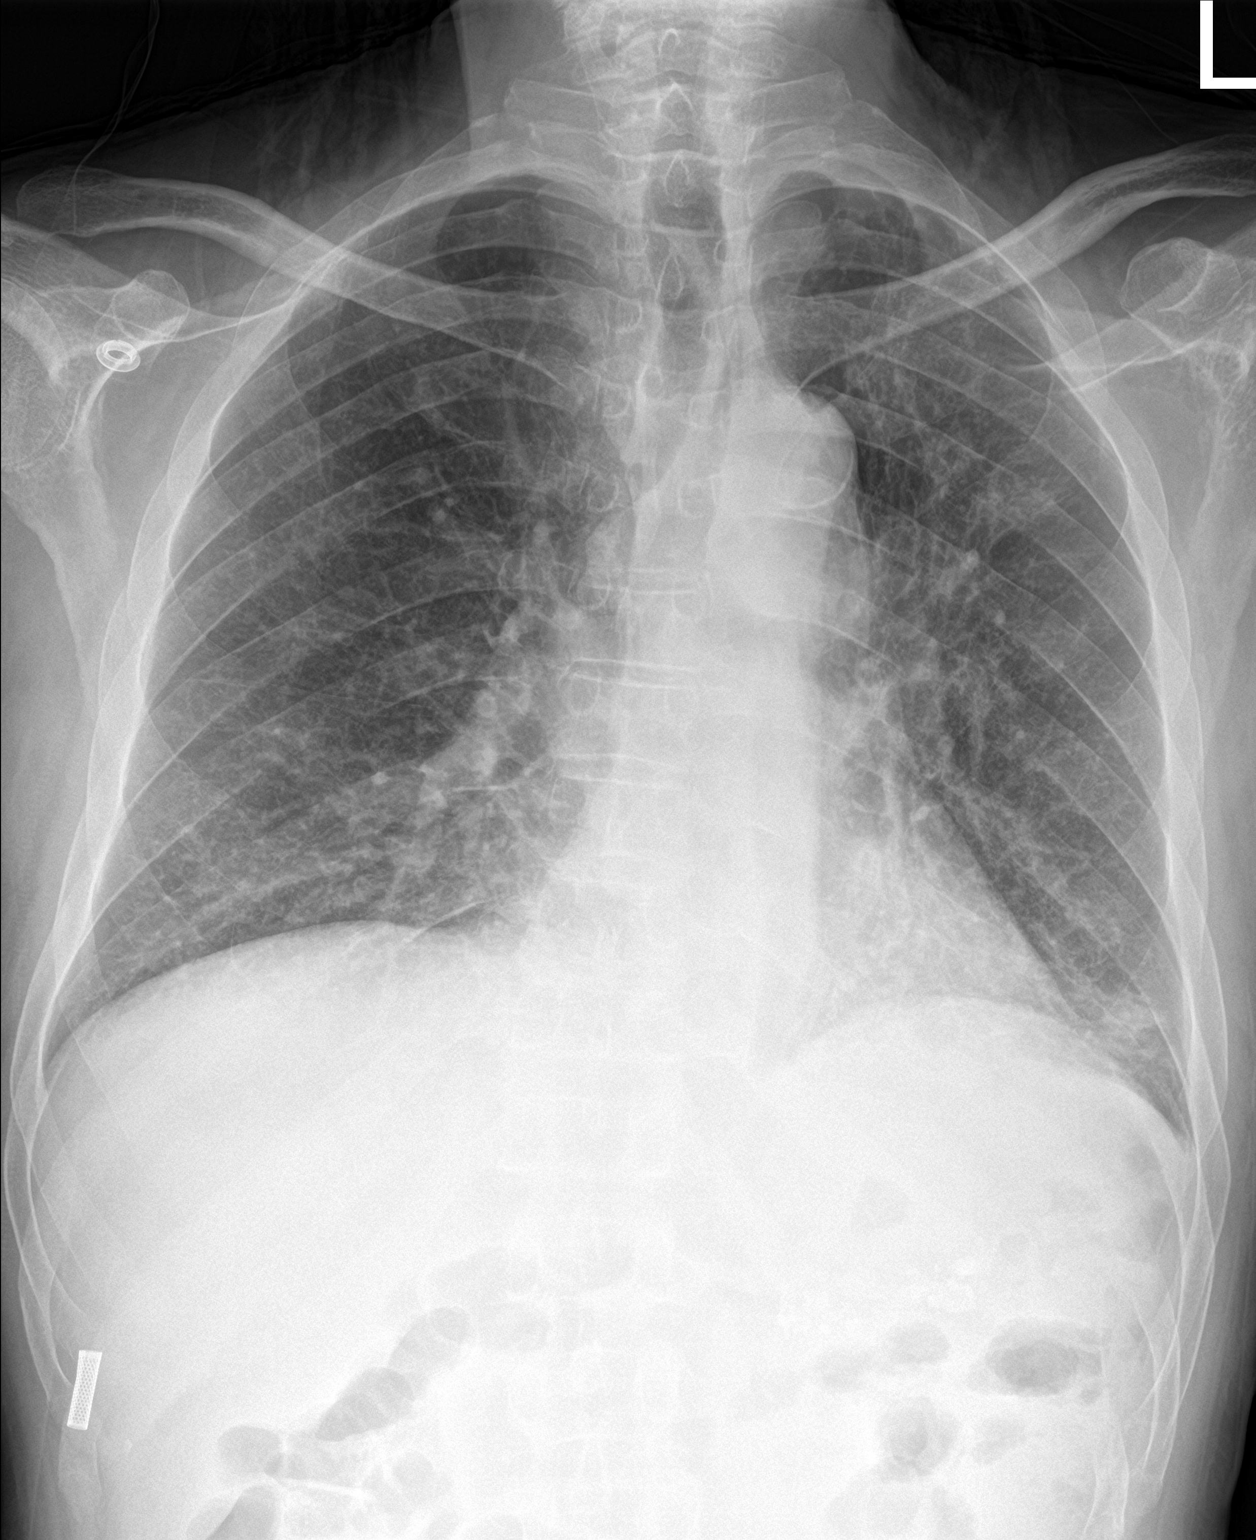

[chest lat]
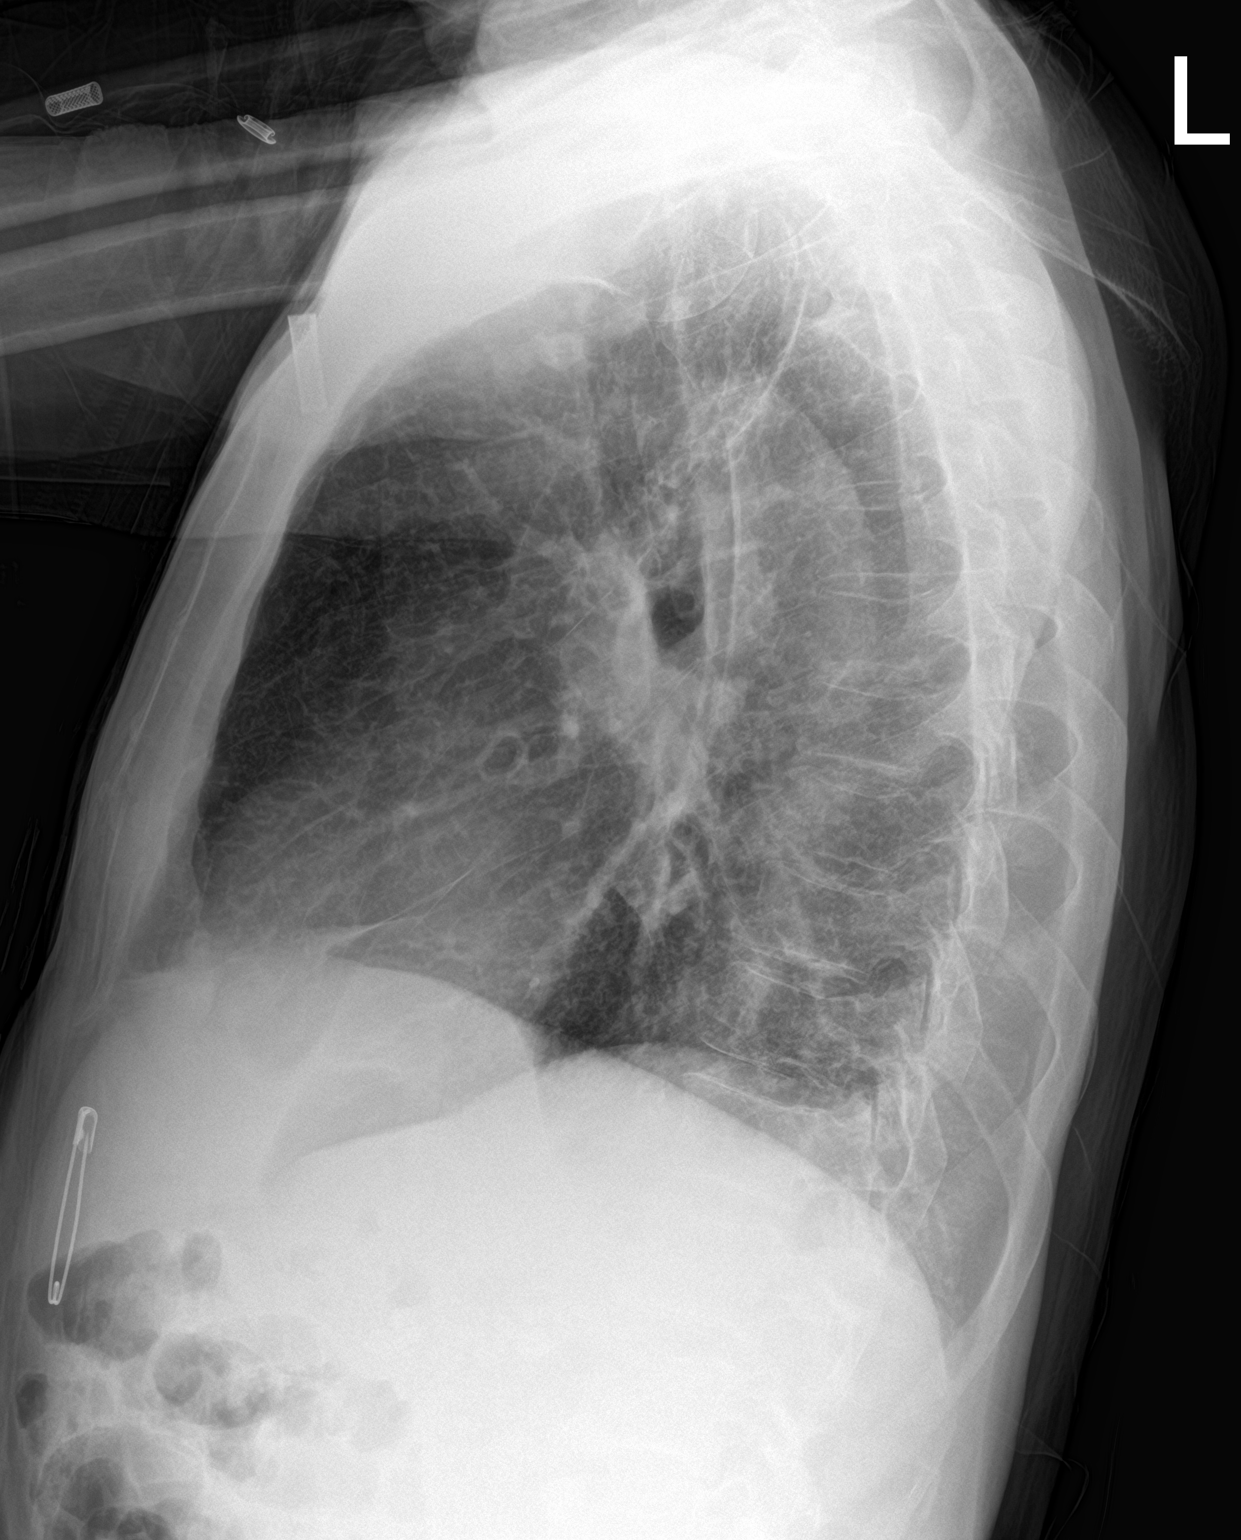

[2 of 2 positions shown; findings below may reference images not displayed]

FINDINGS: The heart size and mediastinal contours are within normal limits.
Atherosclerotic calcification of the aorta is noted. There is
redemonstration of scarring and patchy opacities in the left upper
lobe and at the lung bases bilaterally, increased from the prior
exam. No effusion or pneumothorax. No acute osseous abnormality.
IMPRESSION: Interstitial and reticulonodular opacities in the left upper lobe
and at the lung bases, increased from the prior exam and may be
infectious or inflammatory.

## 2021-01-21 MED ORDER — MORPHINE SULFATE (PF) 4 MG/ML IV SOLN
4.0000 mg | Freq: Once | INTRAVENOUS | Status: AC
Start: 2021-01-21 — End: 2021-01-21
  Administered 2021-01-21: 20:00:00 4 mg via INTRAVENOUS
  Filled 2021-01-21: qty 1

## 2021-01-21 MED ORDER — SODIUM CHLORIDE 0.9 % IV BOLUS
1000.0000 mL | Freq: Once | INTRAVENOUS | Status: AC
  Administered 2021-01-21: 20:00:00 1000 mL via INTRAVENOUS

## 2021-01-21 MED ORDER — ONDANSETRON HCL 4 MG/2ML IJ SOLN
4.0000 mg | Freq: Once | INTRAMUSCULAR | Status: AC
  Administered 2021-01-21: 20:00:00 4 mg via INTRAVENOUS
  Filled 2021-01-21: qty 2

## 2021-01-21 MED ORDER — IOHEXOL 300 MG/ML  SOLN
100.0000 mL | Freq: Once | INTRAMUSCULAR | Status: AC | PRN
  Administered 2021-01-21: 22:00:00 100 mL via INTRAVENOUS

## 2021-01-21 NOTE — ED Triage Notes (Signed)
Pt arrived POV c/o of abdominal pain x3 weeks. Pt states it just hurts. Pt cannot tell us where exactly it hurts. Pt denies any N/V/D.

## 2021-01-21 NOTE — ED Provider Notes (Signed)
Emergency Medicine Provider Triage Evaluation Note  Ricky [name] , a 65 y.o. male  was evaluated in triage.  Pt complains of epigastric abdominal pain progressively worsening x3-week.  Patient states that it feels like an ulcer.  He has had 1 in the past.  Is been years ago.  Patient drinks beer daily.  He has been taking Tylenol for pain without relief.  He has noticed black stools..  Review of Systems  Positive: Abd pain Negative: Fever, vomiting   Physical Exam  BP 111/83 (BP Location: Left Arm)    Pulse 91    Temp 98.7 F (37.1 C) (Oral)    Resp 18    Ht 6\' 1"  (1.854 m)    Wt 77.1 kg    SpO2 100%    BMI 22.43 kg/m  Gen:   Awake, no distress   Resp:  Normal effort  MSK:   Moves extremities without difficulty  Other:  Ttp epigastric pain  Medical Decision Making  Medically screening exam initiated at 3:26 PM.  Appropriate orders placed.  JAHFARI AMBERS was informed that the remainder of the evaluation will be completed by another provider, this initial triage assessment does not replace that evaluation, and the importance of remaining in the ED until their evaluation is complete.  Work-up initiated.  Unable to collect type and screen at this time.   Margarita Mail, PA-C 01/21/21 1528    Regan Lemming, MD 01/21/21 [phone]

## 2021-01-21 NOTE — ED Provider Notes (Signed)
[facility] EMERGENCY DEPARTMENT Provider Note   CSN: [id number] Arrival date & time: 01/21/21  1424     History Chief Complaint  Patient presents with   Abdominal Pain    Ricky [name] is a 65 y.o. male with past medical history significant for HIV (noncompliant with antiretrovirals), HTN, cocaine abuse who presents with abdominal pain and cough.  The patient is a poor historian and reports nonspecific abdominal pain for several weeks.  He is unable to characterize it further and states it feels like when he had a stomach ulcer years ago. He points to both his epigastric region and suprapubic region when asked where his pain is.  He denies nausea or vomiting.  He does report increased urinary frequency and dysuria.  He says he has not taking his medications for HIV in several weeks.  He is unable to give me a specific reason as to why he discontinued his antiretrovirals.  He also reports a chronic cough that has been worse over the past week.  He denies any fevers, chills, chest pain, nausea, vomiting, or changes to bowel movements.     Past Medical History:  Diagnosis Date   Human immunodeficiency virus (HIV) disease ([place]) 03/26/2020    Patient Active Problem List   Diagnosis Date Noted   Chronic low back pain    HIV infection ([place])    Community acquired pneumonia    Nonadherence to medical treatment    COVID-19 virus infection 10/13/2020   Alcohol abuse, daily use    COVID-19 10/12/2020   Mycobacterium avium complex ([place])    Blurry vision, left eye    AKI (acute kidney injury) ([place])    Cavitary lesion of lung    Acute nonintractable headache    Smoking    Cocaine use    Paroxysmal SVT (supraventricular tachycardia) ([place])    Abnormal EKG    Atrial flutter ([place]) 03/27/2020   Aortic atherosclerosis ([place]) 03/27/2020   Hypertension 03/27/2020   Meningitis, 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   Leukocytopenia 03/26/2020   Macrocytosis 03/26/2020   Moderate  protein malnutrition ([place]) 03/26/2020   Hypoalbuminemia 03/26/2020   AIDS (acquired immune deficiency syndrome) ([place]) 03/26/2020    Past Surgical History:  Procedure Laterality Date   IR FL GUIDED LOC OF NEEDLE/CATH TIP FOR SPINAL INJECTION LT  10/17/2020   SVT ABLATION N/A 04/23/2020   Procedure: SVT ABLATION;  Surgeon: Evans Lance, MD;  Location: [facility];  Service: Cardiovascular;  Laterality: N/A;       Family History  Problem Relation Age of Onset   Hypertension Mother    Kidney disease Mother    Hypertension Sister    Hypertension Brother     Social History   Tobacco Use   Smoking status: Former    Types: Cigarettes   Smokeless tobacco: Former  Substance Use Topics   Alcohol use: Yes    Alcohol/week: 14.0 - 21.0 standard drinks    Types: 14 - 21 Cans of beer per week   Drug use: Not Currently    Home Medications Prior to Admission medications   Medication Sig Start Date End Date Taking? Authorizing Provider  acetaminophen (TYLENOL) 500 MG tablet Take 500 mg by mouth every 6 (six) hours as needed for moderate pain or headache.    [provider]  bictegravir-emtricitabine-tenofovir AF (BIKTARVY) 50-200-25 MG TABS tablet Take 1 tablet by mouth daily. 10/25/20   Zenia Resides, MD  fluticasone (FLONASE) 50 MCG/ACT nasal spray  Place 1 spray into both nostrils as needed for allergies or rhinitis. 10/26/20 12/25/20  Donney Dice, DO  ibuprofen (ADVIL) 200 MG tablet Take 200 mg by mouth every 6 (six) hours as needed for headache or moderate pain.    [provider]  sulfamethoxazole-trimethoprim (BACTRIM) 400-80 MG tablet Take 1 tablet by mouth daily. 10/25/20   Zenia Resides, MD  verapamil (CALAN-SR) 120 MG CR tablet Take 1 tablet (120 mg total) by mouth daily. 10/27/20 12/26/20  Donney Dice, DO    Allergies    Bactrim [sulfamethoxazole-trimethoprim]  Review of Systems   Review of Systems  Constitutional:  Negative for chills and  fever.  HENT:  Negative for ear pain and sore throat.   Eyes:  Negative for pain and visual disturbance.  Respiratory:  Positive for cough and shortness of breath.   Cardiovascular:  Negative for chest pain and palpitations.  Gastrointestinal:  Positive for abdominal pain and nausea. Negative for vomiting.  Genitourinary:  Negative for dysuria and hematuria.  Musculoskeletal:  Negative for arthralgias and back pain.  Skin:  Negative for color change and rash.  Neurological:  Negative for seizures and syncope.  All other systems reviewed and are negative.  Physical Exam Updated Vital Signs BP 113/80 (BP Location: Left Arm)    Pulse 86    Temp 98.7 F (37.1 C) (Oral)    Resp 16    Ht _0  (1.854 m)    Wt 77.1 kg    SpO2 98%    BMI 22.43 kg/m   Physical Exam Vitals and nursing note reviewed.  Constitutional:      General: He is not in acute distress.    Appearance: He is well-developed. He is ill-appearing (chronically ill appearing).  HENT:     Head: Normocephalic and atraumatic.     Right Ear: External ear normal.     Left Ear: External ear normal.     Nose: Nose normal.     Mouth/Throat:     Mouth: Mucous membranes are moist.     Pharynx: Oropharynx is clear.  Eyes:     Extraocular Movements: Extraocular movements intact.     Conjunctiva/sclera: Conjunctivae normal.     Pupils: Pupils are equal, round, and reactive to light.  Cardiovascular:     Rate and Rhythm: Normal rate and regular rhythm.     Heart sounds: No murmur heard. Pulmonary:     Effort: Pulmonary effort is normal. No respiratory distress.     Breath sounds: Rales (diffuse, scattered) present.  Abdominal:     Palpations: Abdomen is soft.     Tenderness: There is generalized abdominal tenderness. There is guarding (voluntary). There is no rebound.     Hernia: A hernia is present. Hernia is present in the right inguinal area (soft, reducible, nontender).  Musculoskeletal:        General: No swelling.      Cervical back: Neck supple.     Right lower leg: No edema.     Left lower leg: No edema.  Skin:    General: Skin is warm and dry.     Capillary Refill: Capillary refill takes less than 2 seconds.  Neurological:     General: No focal deficit present.     Mental Status: He is alert and oriented to person, place, and time.  Psychiatric:        Mood and Affect: Mood normal.    ED Results / Procedures / Treatments   Labs (all labs  ordered are listed, but only abnormal results are displayed) Labs Reviewed  COMPREHENSIVE METABOLIC PANEL - Abnormal; Notable for the following components:      Result Value   Sodium 127 (*)    Chloride 96 (*)    Glucose, Bld 157 (*)    BUN 5 (*)    Calcium 8.3 (*)    Albumin 1.8 (*)    AST 54 (*)    Alkaline Phosphatase 235 (*)    All other components within normal limits  CBC - Abnormal; Notable for the following components:   WBC 2.2 (*)    RBC 3.55 (*)    Hemoglobin 11.3 (*)    HCT 35.0 (*)    All other components within normal limits  URINALYSIS, ROUTINE W REFLEX MICROSCOPIC - Abnormal; Notable for the following components:   Color, Urine ORANGE (*)    APPearance CLOUDY (*)    Hgb urine dipstick TRACE (*)    Bilirubin Urine SMALL (*)    Nitrite POSITIVE (*)    Leukocytes,Ua MODERATE (*)    All other components within normal limits  URINALYSIS, MICROSCOPIC (REFLEX) - Abnormal; Notable for the following components:   Bacteria, UA MANY (*)    All other components within normal limits  LIPASE, BLOOD    EKG None  Radiology DG Chest 2 View  Result Date: 01/21/2021 CLINICAL DATA:  Productive cough. EXAM: CHEST - 2 VIEW COMPARISON:  10/12/2020 FINDINGS: The heart size and mediastinal contours are within normal limits. Atherosclerotic calcification of the aorta is noted. There is redemonstration of scarring and patchy opacities in the left upper lobe and at the lung bases bilaterally, increased from the prior exam. No effusion or pneumothorax.  No acute osseous abnormality. IMPRESSION: Interstitial and reticulonodular opacities in the left upper lobe and at the lung bases, increased from the prior exam and may be infectious or inflammatory. Electronically Signed   By: Brett Fairy M.D.   On: 01/21/2021 20:30   CT ABDOMEN PELVIS W CONTRAST  Result Date: 01/21/2021 CLINICAL DATA:  Abdominal pain for several weeks, initial encounter EXAM: CT ABDOMEN AND PELVIS WITH CONTRAST TECHNIQUE: Multidetector CT imaging of the abdomen and pelvis was performed using the standard protocol following bolus administration of intravenous contrast. CONTRAST:  131m OMNIPAQUE IOHEXOL 300 MG/ML  SOLN COMPARISON:  10/12/2020 FINDINGS: Lower chest: Lung base demonstrates some chronic infiltrative changes in the medial lower lobes similar to that seen on the prior exam. Emphysematous changes and changes of bronchiectasis are seen. There is an enlarging nodule in the anterolateral aspect of the left lower lobe measuring 1.6 x 1.4 cm. This previously measured up to 12 mm in dimension. It also has a more full rounded appearance when compared with the prior exam. These changes are suspicious for underlying neoplasm. Hepatobiliary: Liver is within normal limits. Mild periportal edema is seen. Gallbladder is well distended with some minimal pericholecystic inflammatory change. Pancreas: Changes of chronic pancreatitis are noted with diffuse calcifications identified. Spleen: Normal in size without focal abnormality. Adrenals/Urinary Tract: Adrenal glands are within normal limits bilaterally. Kidneys demonstrate a normal enhancement pattern bilaterally. Normal excretion is noted on delayed images. The bladder is partially distended. Stomach/Bowel: Colon shows no obstructive or inflammatory changes. The appendix is well visualized with barium within. No inflammatory changes to suggest appendicitis are noted. Stomach is within normal limits. No obstructive changes in the small bowel  are noted. There is herniation of at least a loop of small bowel into a right inguinal hernia.  No definitive obstructive changes are seen although this may be related to the patient's underlying symptomatology of pain. Vascular/Lymphatic: Atherosclerotic calcifications are noted. Multiple prominent lymph nodes are noted in the small bowel mesentery. Additionally a pre pancreatic node measuring up to 15 mm in short axis is seen. Peri aortic and intra-aortocaval lymph nodes measuring up to 2.5 cm in short axis are seen. Reproductive: Prostate is unremarkable. Other: No abdominal wall hernia or abnormality. No abdominopelvic ascites. Musculoskeletal: No acute or significant osseous findings. IMPRESSION: Right inguinal hernia containing a few loops of small bowel without definitive obstructive change. This may be related to the patient's underlying discomfort. Enlarging soft tissue nodule in the left lower lobe measuring up to 1.6 cm. Although prior chest CTs show some inflammatory changes this must be viewed with some suspicion for underlying neoplasm given its increase in size and bulk when compared with the prior study. Referral to multi disciplinary review board may be helpful to guide workup. Multiple mesenteric lymph nodes, pre pancreatic lymph node and multiple retroperitoneal enlarged lymph nodes are noted. Further workup of these nodes is recommended as well. These may be related to patient's underlying HIV status. Suggestion of wall thickening and mild Peri cholecystic fluid. Right upper quadrant ultrasound may be helpful for further evaluation. Electronically Signed   By: Inez Catalina M.D.   On: 01/21/2021 22:41   US Abdomen Limited RUQ (LIVER/GB)  Result Date: 01/21/2021 CLINICAL DATA:  Right upper quadrant pain. EXAM: ULTRASOUND ABDOMEN LIMITED RIGHT UPPER QUADRANT COMPARISON:  01/21/2021. FINDINGS: Gallbladder: No cholelithiasis. There is mild gallbladder wall thickening with pericholecystic fluid. No  sonographic Murphy sign noted by sonographer. Common bile duct: Diameter: 4.3 mm. Liver: No focal lesion identified. Within normal limits in parenchymal echogenicity. Portal vein is patent on color Doppler imaging with normal direction of blood flow towards the liver. Other: Small amount of ascites adjacent to the gallbladder. IMPRESSION: 1. Mild gallbladder wall thickening and pericholecystic fluid. No sonographic Murphy sign. Findings may be related to ascites or local inflammatory changes. 2. Mild ascites at the gallbladder fossa. Electronically Signed   By: Brett Fairy M.D.   On: 01/21/2021 23:57    Procedures Procedures   Medications Ordered in ED Medications - No data to display  ED Course  I have reviewed the triage vital signs and the nursing notes.  Pertinent labs & imaging results that were available during my care of the patient were reviewed by me and considered in my medical decision making (see chart for details).    MDM Rules/Calculators/A&P                          Patient presents with abdominal pain and lab and imaging findings consistent with both cholecystitis and UTI.  Rocephin/Flagyl ordered for antibiotic coverage.  I discussed the patient with general surgery who will evaluate the patient. The patient was discussed with hospitalist medicine who will admit the patient for continued care and monitoring.   Final Clinical Impression(s) / ED Diagnoses Final diagnoses:  None    Rx / DC Orders ED Discharge Orders     None        Molleigh Huot, Amalia Hailey, MD 01/22/21 1216    Isla Pence, MD 01/22/21 1529

## 2021-01-21 NOTE — ED Notes (Signed)
Patient transported to CT 

## 2021-01-22 ENCOUNTER — Encounter (HOSPITAL_COMMUNITY): Payer: Self-pay | Admitting: Family Medicine

## 2021-01-22 ENCOUNTER — Inpatient Hospital Stay (HOSPITAL_COMMUNITY): Payer: Medicaid Other

## 2021-01-22 ENCOUNTER — Other Ambulatory Visit (HOSPITAL_COMMUNITY): Payer: Self-pay

## 2021-01-22 DIAGNOSIS — R8281 Pyuria: Secondary | ICD-10-CM | POA: Diagnosis present

## 2021-01-22 DIAGNOSIS — Z8616 Personal history of COVID-19: Secondary | ICD-10-CM | POA: Diagnosis not present

## 2021-01-22 DIAGNOSIS — B20 Human immunodeficiency virus [HIV] disease: Secondary | ICD-10-CM | POA: Diagnosis present

## 2021-01-22 DIAGNOSIS — B451 Cerebral cryptococcosis: Secondary | ICD-10-CM | POA: Diagnosis not present

## 2021-01-22 DIAGNOSIS — E871 Hypo-osmolality and hyponatremia: Secondary | ICD-10-CM | POA: Diagnosis present

## 2021-01-22 DIAGNOSIS — R509 Fever, unspecified: Secondary | ICD-10-CM | POA: Diagnosis not present

## 2021-01-22 DIAGNOSIS — D72819 Decreased white blood cell count, unspecified: Secondary | ICD-10-CM | POA: Diagnosis present

## 2021-01-22 DIAGNOSIS — K2970 Gastritis, unspecified, without bleeding: Secondary | ICD-10-CM | POA: Diagnosis present

## 2021-01-22 DIAGNOSIS — K810 Acute cholecystitis: Secondary | ICD-10-CM

## 2021-01-22 DIAGNOSIS — U071 COVID-19: Secondary | ICD-10-CM | POA: Diagnosis not present

## 2021-01-22 DIAGNOSIS — I10 Essential (primary) hypertension: Secondary | ICD-10-CM | POA: Diagnosis present

## 2021-01-22 DIAGNOSIS — N390 Urinary tract infection, site not specified: Secondary | ICD-10-CM

## 2021-01-22 DIAGNOSIS — Z720 Tobacco use: Secondary | ICD-10-CM | POA: Diagnosis not present

## 2021-01-22 DIAGNOSIS — Z79899 Other long term (current) drug therapy: Secondary | ICD-10-CM | POA: Diagnosis not present

## 2021-01-22 DIAGNOSIS — R591 Generalized enlarged lymph nodes: Secondary | ICD-10-CM | POA: Insufficient documentation

## 2021-01-22 DIAGNOSIS — K861 Other chronic pancreatitis: Secondary | ICD-10-CM | POA: Diagnosis present

## 2021-01-22 DIAGNOSIS — I2510 Atherosclerotic heart disease of native coronary artery without angina pectoris: Secondary | ICD-10-CM | POA: Diagnosis present

## 2021-01-22 DIAGNOSIS — Z20822 Contact with and (suspected) exposure to covid-19: Secondary | ICD-10-CM | POA: Diagnosis present

## 2021-01-22 DIAGNOSIS — E875 Hyperkalemia: Secondary | ICD-10-CM | POA: Diagnosis present

## 2021-01-22 DIAGNOSIS — F1410 Cocaine abuse, uncomplicated: Secondary | ICD-10-CM | POA: Diagnosis present

## 2021-01-22 DIAGNOSIS — Z91199 Patient's noncompliance with other medical treatment and regimen due to unspecified reason: Secondary | ICD-10-CM

## 2021-01-22 DIAGNOSIS — E440 Moderate protein-calorie malnutrition: Secondary | ICD-10-CM | POA: Diagnosis present

## 2021-01-22 DIAGNOSIS — R911 Solitary pulmonary nodule: Secondary | ICD-10-CM | POA: Diagnosis present

## 2021-01-22 DIAGNOSIS — Z5900 Homelessness unspecified: Secondary | ICD-10-CM | POA: Diagnosis not present

## 2021-01-22 DIAGNOSIS — F1010 Alcohol abuse, uncomplicated: Secondary | ICD-10-CM | POA: Diagnosis present

## 2021-01-22 DIAGNOSIS — R8271 Bacteriuria: Secondary | ICD-10-CM | POA: Diagnosis not present

## 2021-01-22 DIAGNOSIS — Z8611 Personal history of tuberculosis: Secondary | ICD-10-CM | POA: Diagnosis not present

## 2021-01-22 DIAGNOSIS — I700 Atherosclerosis of aorta: Secondary | ICD-10-CM | POA: Diagnosis present

## 2021-01-22 DIAGNOSIS — F1490 Cocaine use, unspecified, uncomplicated: Secondary | ICD-10-CM | POA: Diagnosis not present

## 2021-01-22 DIAGNOSIS — Z6822 Body mass index (BMI) 22.0-22.9, adult: Secondary | ICD-10-CM | POA: Diagnosis not present

## 2021-01-22 DIAGNOSIS — E8809 Other disorders of plasma-protein metabolism, not elsewhere classified: Secondary | ICD-10-CM | POA: Diagnosis present

## 2021-01-22 DIAGNOSIS — R590 Localized enlarged lymph nodes: Secondary | ICD-10-CM | POA: Diagnosis present

## 2021-01-22 LAB — COMPREHENSIVE METABOLIC PANEL
ALT: 11 U/L (ref 0–44)
AST: 79 U/L — ABNORMAL HIGH (ref 15–41)
Albumin: 1.7 g/dL — ABNORMAL LOW (ref 3.5–5.0)
Alkaline Phosphatase: 221 U/L — ABNORMAL HIGH (ref 38–126)
Anion gap: 7 (ref 5–15)
BUN: 6 mg/dL — ABNORMAL LOW (ref 8–23)
CO2: 22 mmol/L (ref 22–32)
Calcium: 7.8 mg/dL — ABNORMAL LOW (ref 8.9–10.3)
Chloride: 96 mmol/L — ABNORMAL LOW (ref 98–111)
Creatinine, Ser: 0.74 mg/dL (ref 0.61–1.24)
GFR, Estimated: >60 mL/min (ref >60)
Glucose, Bld: 89 mg/dL (ref 70–99)
Potassium: 5.4 mmol/L — ABNORMAL HIGH (ref 3.5–5.1)
Sodium: 125 mmol/L — ABNORMAL LOW (ref 135–145)
Total Bilirubin: 1.6 mg/dL — ABNORMAL HIGH (ref 0.3–1.2)
Total Protein: 6.7 g/dL (ref 6.5–8.1)

## 2021-01-22 LAB — RAPID URINE DRUG SCREEN, HOSP PERFORMED
Amphetamines: NOT DETECTED
Barbiturates: NOT DETECTED
Benzodiazepines: NOT DETECTED
Cocaine: POSITIVE — AB
Opiates: POSITIVE — AB
Tetrahydrocannabinol: NOT DETECTED

## 2021-01-22 LAB — CBC WITH DIFFERENTIAL/PLATELET
Abs Immature Granulocytes: 0 10*3/uL (ref 0.00–0.07)
Basophils Absolute: 0 10*3/uL (ref 0.0–0.1)
Basophils Relative: 0 %
Eosinophils Absolute: 0 10*3/uL (ref 0.0–0.5)
Eosinophils Relative: 1 %
HCT: 35.5 % — ABNORMAL LOW (ref 39.0–52.0)
Hemoglobin: 11.7 g/dL — ABNORMAL LOW (ref 13.0–17.0)
Lymphocytes Relative: 0 %
Lymphs Abs: 0 10*3/uL — ABNORMAL LOW (ref 0.7–4.0)
MCH: 31.5 pg (ref 26.0–34.0)
MCHC: 33 g/dL (ref 30.0–36.0)
MCV: 95.4 fL (ref 80.0–100.0)
Monocytes Absolute: 0.4 10*3/uL (ref 0.1–1.0)
Monocytes Relative: 11 %
Neutro Abs: 3.2 10*3/uL (ref 1.7–7.7)
Neutrophils Relative %: 88 %
Platelets: 145 10*3/uL — ABNORMAL LOW (ref 150–400)
RBC: 3.72 MIL/uL — ABNORMAL LOW (ref 4.22–5.81)
RDW: 15.5 % (ref 11.5–15.5)
WBC: 3.6 10*3/uL — ABNORMAL LOW (ref 4.0–10.5)
nRBC: 0 % (ref 0.0–0.2)
nRBC: 0 /100 WBC

## 2021-01-22 LAB — RESP PANEL BY RT-PCR (FLU A&B, COVID) ARPGX2
Influenza A by PCR: NEGATIVE
Influenza B by PCR: NEGATIVE
SARS Coronavirus 2 by RT PCR: POSITIVE — AB

## 2021-01-22 LAB — T-HELPER CELLS (CD4) COUNT (NOT AT ARMC)
CD4 % Helper T Cell: 3 % — ABNORMAL LOW (ref 33–65)
CD4 T Cell Abs: <35 /uL — ABNORMAL LOW (ref 400–1790)

## 2021-01-22 LAB — CRYPTOCOCCAL ANTIGEN
Crypto Ag: POSITIVE — AB
Cryptococcal Ag Titer: 20 — AB

## 2021-01-22 MED ORDER — SODIUM CHLORIDE 0.9 % IV SOLN: INTRAVENOUS | Status: DC

## 2021-01-22 MED ORDER — SENNOSIDES-DOCUSATE SODIUM 8.6-50 MG PO TABS: 1.0000 | ORAL_TABLET | Freq: Every evening | ORAL | Status: DC | PRN

## 2021-01-22 MED ORDER — SODIUM CHLORIDE 0.9 % IV SOLN
2.0000 g | Freq: Once | INTRAVENOUS | Status: AC
  Administered 2021-01-22: 01:00:00 2 g via INTRAVENOUS
  Filled 2021-01-22: qty 20

## 2021-01-22 MED ORDER — NICOTINE 14 MG/24HR TD PT24
14.0000 mg | MEDICATED_PATCH | Freq: Every day | TRANSDERMAL | Status: DC
  Administered 2021-01-23: 08:00:00 14 mg via TRANSDERMAL
  Filled 2021-01-22 (×3): qty 1

## 2021-01-22 MED ORDER — PIPERACILLIN-TAZOBACTAM 3.375 G IVPB
3.3750 g | Freq: Three times a day (TID) | INTRAVENOUS | Status: DC
  Administered 2021-01-22: 07:00:00 3.375 g via INTRAVENOUS
  Filled 2021-01-22: qty 50

## 2021-01-22 MED ORDER — COSYNTROPIN 0.25 MG IJ SOLR
0.2500 mg | Freq: Once | INTRAMUSCULAR | Status: AC
  Administered 2021-01-23: 05:00:00 0.25 mg via INTRAVENOUS
  Filled 2021-01-22: qty 0.25

## 2021-01-22 MED ORDER — HYDROMORPHONE HCL 1 MG/ML IJ SOLN
1.0000 mg | INTRAMUSCULAR | Status: AC | PRN
  Administered 2021-01-22 (×2): 1 mg via INTRAVENOUS
  Filled 2021-01-22 (×2): qty 1

## 2021-01-22 MED ORDER — ACETAMINOPHEN 650 MG RE SUPP: 650.0000 mg | Freq: Four times a day (QID) | RECTAL | Status: DC | PRN

## 2021-01-22 MED ORDER — FLUCONAZOLE 200 MG PO TABS
400.0000 mg | ORAL_TABLET | Freq: Every day | ORAL | 0 refills | Status: DC
  Filled 2021-01-22: qty 60, 30d supply, fill #0

## 2021-01-22 MED ORDER — ACETAMINOPHEN 325 MG PO TABS
650.0000 mg | ORAL_TABLET | Freq: Four times a day (QID) | ORAL | Status: DC | PRN
  Administered 2021-01-22 (×2): 650 mg via ORAL
  Filled 2021-01-22 (×2): qty 2

## 2021-01-22 MED ORDER — ONDANSETRON HCL 4 MG PO TABS: 4.0000 mg | ORAL_TABLET | Freq: Four times a day (QID) | ORAL | Status: DC | PRN

## 2021-01-22 MED ORDER — KETOROLAC TROMETHAMINE 15 MG/ML IJ SOLN
15.0000 mg | Freq: Four times a day (QID) | INTRAMUSCULAR | Status: AC | PRN
  Administered 2021-01-23: 18:00:00 15 mg via INTRAVENOUS
  Filled 2021-01-22: qty 1

## 2021-01-22 MED ORDER — METRONIDAZOLE 500 MG/100ML IV SOLN
500.0000 mg | Freq: Two times a day (BID) | INTRAVENOUS | Status: DC
  Administered 2021-01-22: 01:00:00 500 mg via INTRAVENOUS
  Filled 2021-01-22: qty 100

## 2021-01-22 MED ORDER — BICTEGRAVIR-EMTRICITAB-TENOFOV 50-200-25 MG PO TABS
1.0000 | ORAL_TABLET | Freq: Every day | ORAL | Status: DC
  Administered 2021-01-22 – 2021-01-24 (×3): 1 via ORAL
  Filled 2021-01-22 (×3): qty 1

## 2021-01-22 MED ORDER — ONDANSETRON HCL 4 MG/2ML IJ SOLN: 4.0000 mg | Freq: Four times a day (QID) | INTRAMUSCULAR | Status: DC | PRN

## 2021-01-22 MED ORDER — ATOVAQUONE 750 MG/5ML PO SUSP
1500.0000 mg | Freq: Every day | ORAL | Status: DC
Start: 2021-01-22 — End: 2021-01-23
  Administered 2021-01-22 – 2021-01-23 (×2): 1500 mg via ORAL
  Filled 2021-01-22 (×2): qty 10

## 2021-01-22 MED ORDER — ENOXAPARIN SODIUM 40 MG/0.4ML IJ SOSY
40.0000 mg | PREFILLED_SYRINGE | INTRAMUSCULAR | Status: DC
  Administered 2021-01-22 – 2021-01-24 (×3): 40 mg via SUBCUTANEOUS
  Filled 2021-01-22 (×3): qty 0.4

## 2021-01-22 MED ORDER — LACTATED RINGERS IV SOLN: INTRAVENOUS | Status: DC

## 2021-01-22 MED ORDER — FLUCONAZOLE 100 MG PO TABS
400.0000 mg | ORAL_TABLET | Freq: Every day | ORAL | Status: DC
  Administered 2021-01-22 – 2021-01-24 (×3): 400 mg via ORAL
  Filled 2021-01-22 (×2): qty 4
  Filled 2021-01-22: qty 2

## 2021-01-22 NOTE — Progress Notes (Signed)
Patient ID: Ricky [name], male   DOB: 1955/06/06, 65 y.o.   MRN: [id number] Patient admitted early this morning for abdominal pain with possible complicated UTI along with?  Acute cholecystitis.  He has been started on broad-spectrum antibiotics and IV fluids.  General surgery has been consulted who doubt that this is acute cholecystitis.  Patient seen and examined at bedside.  I have reviewed patient's medical records including this morning's H&P, current vitals, labs, medications myself.  Repeat a.m. labs.  Continue antibiotics.  Follow cultures.  I will also consult ID because of patient being HIV positive and not taking antiretroviral therapy.

## 2021-01-22 NOTE — Progress Notes (Incomplete)
TOC meds

## 2021-01-22 NOTE — Consult Note (Addendum)
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 HIV genotype 03/2020 off meds x2 years no nrti mutation; sensitive to all integrase strand inhibitor    Previously briefly treated for pulm mac due to chronic cavitary changes seen (but in setting previous TB unclear-- had stopped mac treatment currently unclear if colonization or true infection). Will send afb fungal culture as has retroperitoneal LAD and nightsweat  Other OI in setting of generalized lymphadenopathy consideration include fungal/tb or lymphoma. HIV in itself can also cause chronic lymphadenopathy as well. Chest ct will be repeated to look for pjp or other OI pulmonary infection  Last 10/2020 repeat csf analysis bland. No crypto cns sx. Will repeat titer but defer csf testing for now. Will check urine fungal cx as crypto likes to involve the prostate as well. He'll need to remain on fluconazole until cd4 >200 and on art and at least 1 year of treatment   With his abd pain --  this is clearly distractable. Could be cough related or covid infection or lymphadenitis. No clinical syndrome of cholecystitis or bladder infection.    Covid positive testing, he does have new abd pain as mentioned and also taste/smell disturbance. Persistent covid infection is certainly a known phenomenom in this population. No obvious beneficial dedicated covid tx needed or proven at this time   Discussed with primary attending dr Starla Link -- will send labs for hyponatremia/hyperkalemia as well. Crypto cns infection can attribute to hyponatremia, and certainly granulomatous adrenal process can cause relative AI which can contribute to both hyponatremia/hyperkalemia  Plan: Stop piptazo Hiv labs Fungitel, ldh, crypto Ag serum, histo/blasto serology, afb blood culture, urine fungal cx Urine sodium/creat/potassiu, tsh, cosyntropin stim test, vbg Chest ct to evaluate chronic cough/oi/pulm nodule Restart biktarvy and fluconazole; start fluc at 400 mg planned for 4-8 weeks then transition to 200 mg daily Supportive care for covid Would keep in covid isolation for duration of this admission Discussed with primary team Discussed with patient assistant program [facility]) and [facility] staff to help facilitate follow up Discussed with ID pharmacy to provide art/fluconazole and pjp prophylaxis on hand prior to discharge (1 month supply)   I spent 120 minute reviewing data/chart, and coordinating care and >50% direct face to face time providing counseling/discussing diagnostics/treatment plan with patient      ------------------------------------------------ Principal Problem:   Acute cholecystitis Active Problems:   Leukocytopenia   Moderate protein malnutrition (HCC)   Hypoalbuminemia   HIV infection ([place])   Complicated UTI (  urinary tract infection)   Noncompliance    HPI: TODD JELINSKI is a 65 y.o. male polysubstance abuse, homelessness (currently living with his sister), aids,  noncompliant, hx cryptococcal meningitis incomplete treatment, admitted for several days generalized abd pain in setting increased acute on chronic dry cough and positive covid test   Patient well known to ID service AIDS/crypto meningitis - he has had spotty ART/fluconazole due to his homelessness/ivdu. He did undergo full induction back early 2022 but then had a few months gap. Readmitted 10/2020 and underwent 1 week induction changed to fluconazole when csf cx remains negative (also relatively noninflammatory in chemistry/cell count, and normal glucose/proten level). He left AMA and didn't f/u Was taking biktarvy previously Lab Results  Component Value Date   HIV1RNAQUANT 25,300 03/27/2020   Lab Results  Component Value Date   CD4TCELL 3 (L) 01/22/2021   CD4TABS <35 (L) 01/22/2021   He appears to be more willing to take ART this time and follow up  He denies headache, visual changes, n/v, rash, joint pain, dyspnea/chest pain He does have acute on chronic dry cough for a week or two   Covid test positive: On admission his covid pcr was positive. He did have positive covid pcr previous admission as well. No chest xray or other dedicated covid tx given. He is on room air As above mentioned new worse dry cough Along with cough generalized abd pain worse by it Good appetite and eating well No n/v/diarrhea Abd ct this admission thickened gall bladder wall; lft minimally elevated Increased retroperitoneal LAD Enlarging lung nodule  Also reports intermittent nightsweat and also new change in taste/smell      Family History  Problem Relation Age of Onset   Hypertension Mother    Kidney disease Mother    Hypertension Sister    Hypertension Brother     Social History   Tobacco Use   Smoking status: Former    Types: Cigarettes   Smokeless tobacco: Former  Substance Use Topics   Alcohol use: Yes    Alcohol/week: 14.0 - 21.0 standard drinks    Types: 14 - 21 Cans of beer per  week   Drug use: Not Currently    Allergies  Allergen Reactions   Bactrim [Sulfamethoxazole-Trimethoprim] Other (See Comments)    Patient was trialed on DS TIW and SS daily for PJP prophylaxis and developed hyperkalemia and increased Scr    Review of Systems: ROS All Other ROS was negative, except mentioned above   Past Medical History:  Diagnosis Date   Human immunodeficiency virus (HIV) disease ([place]) 03/26/2020       Scheduled Meds:  atovaquone  1,500 mg Oral Q breakfast   bictegravir-emtricitabine-tenofovir AF  1 tablet Oral Daily   [START ON 01/23/2021] cosyntropin  0.25 mg Intravenous Once   enoxaparin (LOVENOX) injection  40 mg Subcutaneous Q24H   fluconazole  400 mg Oral Daily   nicotine  14 mg Transdermal Daily   Continuous Infusions:  sodium chloride 100 mL/hr at 01/22/21 0955   PRN Meds:.acetaminophen **OR** acetaminophen, HYDROmorphone (DILAUDID) injection, ketorolac, ondansetron **OR** ondansetron (ZOFRAN) IV, senna-docusate   OBJECTIVE: Blood pressure 103/65, pulse 74, temperature 99.4 F (37.4 C), temperature source Oral, resp. rate 20, height _0  (1.854 m), weight 77.1 kg, SpO2 97 %.  Physical Exam  General/constitutional: no distress, pleasant HEENT: Normocephalic, PER, Conj Clear, EOMI, Oropharynx clear Neck supple CV: rrr no mrg Lungs: clear to auscultation, normal respiratory effort Abd: Soft, no rebound or guarding. Mild-moderate  tenderness on palpation generalized, but distractable. Ext: no edema Skin: No Rash Neuro: nonfocal MSK: no peripheral joint swelling/tenderness/warmth; back spines nontender Psych alert/oriented  Central line presence: no   Lab Results Lab Results  Component Value Date   WBC 3.6 (L) 01/22/2021   HGB 11.7 (L) 01/22/2021   HCT 35.5 (L) 01/22/2021   MCV 95.4 01/22/2021   PLT 145 (L) 01/22/2021    Lab Results  Component Value Date   CREATININE 0.74 01/22/2021   BUN 6 (L) 01/22/2021   NA 125 (L)  01/22/2021   K 5.4 (H) 01/22/2021   CL 96 (L) 01/22/2021   CO2 22 01/22/2021    Lab Results  Component Value Date   ALT 11 01/22/2021   AST 79 (H) 01/22/2021   ALKPHOS 221 (H) 01/22/2021   BILITOT 1.6 (H) 01/22/2021      Microbiology: Recent Results (from the past 240 hour(s))  Resp Panel by RT-PCR (Flu A&B, Covid) Nasopharyngeal Swab     Status: Abnormal   Collection Time: 01/21/21 10:56 PM   Specimen: Nasopharyngeal Swab; Nasopharyngeal(NP) swabs in vial transport medium  Result Value Ref Range Status   SARS Coronavirus 2 by RT PCR POSITIVE (A) NEGATIVE Final    Comment: (NOTE) SARS-CoV-2 target nucleic acids are DETECTED.  The SARS-CoV-2 RNA is generally detectable in upper respiratory specimens during the acute phase of infection. Positive results are indicative of the presence of the identified virus, but do not rule out bacterial infection or co-infection with other pathogens not detected by the test. Clinical correlation with patient history and other diagnostic information is necessary to determine patient infection status. The expected result is Negative.  Fact Sheet for Patients: EntrepreneurPulse.com.au  Fact Sheet for Healthcare Providers: IncredibleEmployment.be  This test is not yet approved or cleared by the [place] FDA and  has been authorized for detection and/or diagnosis of SARS-CoV-2 by FDA under an Emergency Use Authorization (EUA).  This EUA will remain in effect (meaning this test can be used) for the duration of  the COVID-19 declaration under Section 564(b)(1) of the A ct, 21 U.S.C. section 360bbb-3(b)(1), unless the authorization is terminated or revoked sooner.     Influenza A by PCR NEGATIVE NEGATIVE Final   Influenza B by PCR NEGATIVE NEGATIVE Final    Comment: (NOTE) The Xpert Xpress SARS-CoV-2/FLU/RSV plus assay is intended as an aid in the diagnosis of influenza from Nasopharyngeal swab  specimens and should not be used as a sole basis for treatment. Nasal washings and aspirates are unacceptable for Xpert Xpress SARS-CoV-2/FLU/RSV testing.  Fact Sheet for Patients: EntrepreneurPulse.com.au  Fact Sheet for Healthcare Providers: IncredibleEmployment.be  This test is not yet approved or cleared by the [place] FDA and has been authorized for detection and/or diagnosis of SARS-CoV-2 by FDA under an Emergency Use Authorization (EUA). This EUA will remain in effect (meaning this test can be used) for the duration of the COVID-19 declaration under Section 564(b)(1) of the Act, 21 U.S.C. section 360bbb-3(b)(1), unless the authorization is terminated or revoked.  Performed at [facility], [place] [address], [place], [place] [address]      Serology:    Imaging: If present, new imagings (plain films, ct scans, and mri) have been personally visualized and interpreted; radiology reports have been reviewed. Decision making incorporated into the Impression / Recommendations.  12/19 ct abd pelv with contrast Right inguinal hernia containing a few loops of small bowel without definitive obstructive change. This may be related to  the patient's underlying discomfort.   Enlarging soft tissue nodule in the left lower lobe measuring up to 1.6 cm. Although prior chest CTs show some inflammatory changes this must be viewed with some suspicion for underlying neoplasm given its increase in size and bulk when compared with the prior study. Referral to multi disciplinary review board may be helpful to guide workup.   Multiple mesenteric lymph nodes, pre pancreatic lymph node and multiple retroperitoneal enlarged lymph nodes are noted. Further workup of these nodes is recommended as well. These may be related to patient's underlying HIV status.   Suggestion of wall thickening and mild Peri cholecystic fluid. Right upper  quadrant ultrasound may be helpful for further evaluation.  Jabier Mutton, [place] [facility] [phone] pager    01/22/2021, 1:17 PM

## 2021-01-22 NOTE — Consult Note (Signed)
Reason for Consult:abdominal pain Referring Provider: Isla Pence, MD  Ricky [name] is an 65 y.o. male.  HPI: 65 yo male with HIV, noncompliant with medications, come in with 1 month of abdominal pain, chest pain, and cough. Abdominal pain is diffuse. He is concerned he has an ulcer. He has not been eating well but has been tolerating some juice and water. His bowel movements have been normal.  Past Medical History:  Diagnosis Date   Human immunodeficiency virus (HIV) disease ([place]) 03/26/2020    Past Surgical History:  Procedure Laterality Date   IR FL GUIDED LOC OF NEEDLE/CATH TIP FOR SPINAL INJECTION LT  10/17/2020   SVT ABLATION N/A 04/23/2020   Procedure: SVT ABLATION;  Surgeon: Evans Lance, MD;  Location: [facility];  Service: Cardiovascular;  Laterality: N/A;    Family History  Problem Relation Age of Onset   Hypertension Mother    Kidney disease Mother    Hypertension Sister    Hypertension Brother     Social History:  reports that he has quit smoking. His smoking use included cigarettes. He has quit using smokeless tobacco. He reports current alcohol use of about 14.0 - 21.0 standard drinks per week. He reports that he does not currently use drugs.  Allergies:  Allergies  Allergen Reactions   Bactrim [Sulfamethoxazole-Trimethoprim] Other (See Comments)    Patient was trialed on DS TIW and SS daily for PJP prophylaxis and developed hyperkalemia and increased Scr    Medications: I have reviewed the patient's current medications.  Results for orders placed or performed during the hospital encounter of 01/21/21 (from the past 48 hour(s))  Urinalysis, Routine w reflex microscopic     Status: Abnormal   Collection Time: 01/21/21  3:21 PM  Result Value Ref Range   Color, Urine ORANGE (A) YELLOW    Comment: BIOCHEMICALS MAY BE AFFECTED BY COLOR   APPearance CLOUDY (A) CLEAR   Specific Gravity, Urine 1.020 1.005 - 1.030   pH 6.0 5.0 - 8.0   Glucose, UA  NEGATIVE NEGATIVE mg/dL   Hgb urine dipstick TRACE (A) NEGATIVE   Bilirubin Urine SMALL (A) NEGATIVE   Ketones, ur NEGATIVE NEGATIVE mg/dL   Protein, ur NEGATIVE NEGATIVE mg/dL   Nitrite POSITIVE (A) NEGATIVE   Leukocytes,Ua MODERATE (A) NEGATIVE    Comment: Performed at [facility] [address] [place], [place] [address]  Urinalysis, Microscopic (reflex)     Status: Abnormal   Collection Time: 01/21/21  3:21 PM  Result Value Ref Range   RBC / HPF 0-5 0 - 5 RBC/hpf   WBC, UA >50 0 - 5 WBC/hpf   Bacteria, UA MANY (A) NONE SEEN   Squamous Epithelial / LPF 0-5 0 - 5   WBC Clumps PRESENT    Mucus PRESENT    Ca Oxalate Crys, UA PRESENT     Comment: Performed at [facility], [place] [address], [place], [place] [address]  Lipase, blood     Status: None   Collection Time: 01/21/21  3:26 PM  Result Value Ref Range   Lipase 33 11 - 51 U/L    Comment: Performed at [facility], [place] [address], [place], [place] [address]  Comprehensive metabolic panel     Status: Abnormal   Collection Time: 01/21/21  3:26 PM  Result Value Ref Range   Sodium 127 (L) 135 - 145 mmol/L   Potassium 4.3 3.5 - 5.1 mmol/L   Chloride  96 (L) 98 - 111 mmol/L   CO2 23 22 - 32 mmol/L   Glucose, Bld 157 (H) 70 - 99 mg/dL    Comment: Glucose reference range applies only to samples taken after fasting for at least 8 hours.   BUN 5 (L) 8 - 23 mg/dL   Creatinine, Ser 0.84 0.61 - 1.24 mg/dL   Calcium 8.3 (L) 8.9 - 10.3 mg/dL   Total Protein 7.3 6.5 - 8.1 g/dL   Albumin 1.8 (L) 3.5 - 5.0 g/dL   AST 54 (H) 15 - 41 U/L   ALT 12 0 - 44 U/L   Alkaline Phosphatase 235 (H) 38 - 126 U/L   Total Bilirubin 0.9 0.3 - 1.2 mg/dL   GFR, Estimated >60 >60 mL/min    Comment: (NOTE) Calculated using the CKD-EPI Creatinine Equation (2021)    Anion gap 8 5 - 15    Comment: Performed at [facility], [place] [address] [place], [place] [address]  CBC     Status: Abnormal   Collection Time: 01/21/21  3:26 PM   Result Value Ref Range   WBC 2.2 (L) 4.0 - 10.5 K/uL   RBC 3.55 (L) 4.22 - 5.81 MIL/uL   Hemoglobin 11.3 (L) 13.0 - 17.0 g/dL   HCT 35.0 (L) 39.0 - 52.0 %   MCV 98.6 80.0 - 100.0 fL   MCH 31.8 26.0 - 34.0 pg   MCHC 32.3 30.0 - 36.0 g/dL   RDW 15.5 11.5 - 15.5 %   Platelets 216 150 - 400 K/uL   nRBC 0.0 0.0 - 0.2 %    Comment: Performed at [facility], [place] [address], [place], [place] [address]    DG Chest 2 View  Result Date: 01/21/2021 CLINICAL DATA:  Productive cough. EXAM: CHEST - 2 VIEW COMPARISON:  10/12/2020 FINDINGS: The heart size and mediastinal contours are within normal limits. Atherosclerotic calcification of the aorta is noted. There is redemonstration of scarring and patchy opacities in the left upper lobe and at the lung bases bilaterally, increased from the prior exam. No effusion or pneumothorax. No acute osseous abnormality. IMPRESSION: Interstitial and reticulonodular opacities in the left upper lobe and at the lung bases, increased from the prior exam and may be infectious or inflammatory. Electronically Signed   By: Brett Fairy M.D.   On: 01/21/2021 20:30   CT ABDOMEN PELVIS W CONTRAST  Result Date: 01/21/2021 CLINICAL DATA:  Abdominal pain for several weeks, initial encounter EXAM: CT ABDOMEN AND PELVIS WITH CONTRAST TECHNIQUE: Multidetector CT imaging of the abdomen and pelvis was performed using the standard protocol following bolus administration of intravenous contrast. CONTRAST:  183mL OMNIPAQUE IOHEXOL 300 MG/ML  SOLN COMPARISON:  10/12/2020 FINDINGS: Lower chest: Lung base demonstrates some chronic infiltrative changes in the medial lower lobes similar to that seen on the prior exam. Emphysematous changes and changes of bronchiectasis are seen. There is an enlarging nodule in the anterolateral aspect of the left lower lobe measuring 1.6 x 1.4 cm. This previously measured up to 12 mm in dimension. It also has a more full rounded appearance when compared  with the prior exam. These changes are suspicious for underlying neoplasm. Hepatobiliary: Liver is within normal limits. Mild periportal edema is seen. Gallbladder is well distended with some minimal pericholecystic inflammatory change. Pancreas: Changes of chronic pancreatitis are noted with diffuse calcifications identified. Spleen: Normal in size without focal abnormality. Adrenals/Urinary Tract: Adrenal glands are within normal limits bilaterally. Kidneys demonstrate a normal  enhancement pattern bilaterally. Normal excretion is noted on delayed images. The bladder is partially distended. Stomach/Bowel: Colon shows no obstructive or inflammatory changes. The appendix is well visualized with barium within. No inflammatory changes to suggest appendicitis are noted. Stomach is within normal limits. No obstructive changes in the small bowel are noted. There is herniation of at least a loop of small bowel into a right inguinal hernia. No definitive obstructive changes are seen although this may be related to the patient's underlying symptomatology of pain. Vascular/Lymphatic: Atherosclerotic calcifications are noted. Multiple prominent lymph nodes are noted in the small bowel mesentery. Additionally a pre pancreatic node measuring up to 15 mm in short axis is seen. Peri aortic and intra-aortocaval lymph nodes measuring up to 2.5 cm in short axis are seen. Reproductive: Prostate is unremarkable. Other: No abdominal wall hernia or abnormality. No abdominopelvic ascites. Musculoskeletal: No acute or significant osseous findings. IMPRESSION: Right inguinal hernia containing a few loops of small bowel without definitive obstructive change. This may be related to the patient's underlying discomfort. Enlarging soft tissue nodule in the left lower lobe measuring up to 1.6 cm. Although prior chest CTs show some inflammatory changes this must be viewed with some suspicion for underlying neoplasm given its increase in size and  bulk when compared with the prior study. Referral to multi disciplinary review board may be helpful to guide workup. Multiple mesenteric lymph nodes, pre pancreatic lymph node and multiple retroperitoneal enlarged lymph nodes are noted. Further workup of these nodes is recommended as well. These may be related to patient's underlying HIV status. Suggestion of wall thickening and mild Peri cholecystic fluid. Right upper quadrant ultrasound may be helpful for further evaluation. Electronically Signed   By: Inez Catalina M.D.   On: 01/21/2021 22:41   US Abdomen Limited RUQ (LIVER/GB)  Result Date: 01/21/2021 CLINICAL DATA:  Right upper quadrant pain. EXAM: ULTRASOUND ABDOMEN LIMITED RIGHT UPPER QUADRANT COMPARISON:  01/21/2021. FINDINGS: Gallbladder: No cholelithiasis. There is mild gallbladder wall thickening with pericholecystic fluid. No sonographic Murphy sign noted by sonographer. Common bile duct: Diameter: 4.3 mm. Liver: No focal lesion identified. Within normal limits in parenchymal echogenicity. Portal vein is patent on color Doppler imaging with normal direction of blood flow towards the liver. Other: Small amount of ascites adjacent to the gallbladder. IMPRESSION: 1. Mild gallbladder wall thickening and pericholecystic fluid. No sonographic Murphy sign. Findings may be related to ascites or local inflammatory changes. 2. Mild ascites at the gallbladder fossa. Electronically Signed   By: Brett Fairy M.D.   On: 01/21/2021 23:57    Review of Systems  Constitutional:  Positive for malaise/fatigue.  HENT: Negative.    Eyes: Negative.   Respiratory:  Positive for cough.   Cardiovascular:  Positive for chest pain.  Gastrointestinal:  Positive for abdominal pain.  Genitourinary: Negative.   Musculoskeletal: Negative.   Skin: Negative.   Neurological: Negative.   Endo/Heme/Allergies: Negative.   Psychiatric/Behavioral: Negative.     PE Blood pressure (!) 152/90, pulse 69, temperature 98.7 F  (37.1 C), temperature source Oral, resp. rate 18, height 6\' 1"  (1.854 m), weight 77.1 kg, SpO2 99 %. Constitutional: NAD; conversant; no deformities Eyes: Moist conjunctiva; no lid lag; anicteric; PERRL Neck: Trachea midline; no thyromegaly Lungs: Normal respiratory effort; no tactile fremitus CV: RRR; no palpable thrills; no pitting edema GI: Abd soft, diffuse pain on palpation, no guarding; no palpable hepatosplenomegaly MSK: unable to assess gait; no clubbing/cyanosis Psychiatric: Appropriate affect; alert and oriented x3 Lymphatic: No palpable  cervical or axillary lymphadenopathy Skin: No major subcutaneous nodules. Warm and dry   Assessment/Plan: 65 yo male with HIV with multiple complaints tonight. [place] with borderline wall thickening, no stones, leukopenia. -Symptoms and findings do not appear to be acute cholecystitis. I do not think he would benefit from surgery or gallbladder procedure at this time.  Arta Bruce Kevonna Nolte 01/22/2021, 12:57 AM

## 2021-01-22 NOTE — H&P (Signed)
History and Physical    Ricky [name] WPY:[id number] DOB: 14-Feb-1955 DOA: 01/21/2021  PCP: Merryl Hacker, No   Chief Complaint: Abdominal pain  HPI: Ricky [name] is a 65 y.o. male with medical history significant for HIV, HTN, cocaine abuse who presents for evaluation of abdominal pain.  He reports the vomiting is mostly in the right upper quadrant and associated with intermittent nausea but he has not had vomiting.  He complains of a dry hacking cough for the last few weeks he states he has used over-the-counter cough medication for but it has not helped him.  Patient is very evasive and reluctant to answer any questions and states "look up in my chart" when he is asked about his medical history.  He has not been on his antiretroviral therapy or hypertension therapy for the last 2 years he states but he does not give a reason why he stopped taking it.  Denies having any fever, chills, shortness of breath, chest pain or palpitations.  ED Course: He has remained hemodynamically stable in the emergency room.  He has been found to have gallbladder wall thickening with pericholecystic fluid on ultrasound and CT scan.  Surgery has been consulted and will evaluate patient.  Lab work reveals WBC of 2200 hemoglobin 1.3 hematocrit 35 platelets 216,000, sodium 127 which is near his baseline potassium 4.3 chloride 96 bicarb 23 creatinine 0.84 BUN 5 glucose 157 alk phosphatase 235 albumin 1.8 lipase 33 AST is 54 ALT 12.  COVID test is pending.  Urinalysis is cloudy and orange with positive leukocytes and nitrites.  Hospitalist service has been asked to admit for further management  Review of Systems:  General: Denies fever, chills, weight loss, night sweats.  Denies dizziness. Reports decreased appetite HENT: Denies head trauma, headache, denies change in hearing, tinnitus.  Denies nasal congestion or bleeding.  Denies sore throat.  Denies difficulty swallowing Eyes: Denies blurry vision, pain in eye, drainage.   Denies discoloration of eyes. Neck: Denies pain.  Denies swelling.  Denies pain with movement. Cardiovascular: Denies chest pain, palpitations.  Denies edema.  Denies orthopnea Respiratory: Reports dry cough. Denies shortness of breath.  Denies wheezing.  Denies sputum production Gastrointestinal: Reports abdominal pain in RUQ.  Denies nausea, vomiting, diarrhea.  Denies melena.  Denies hematemesis. Musculoskeletal: Denies limitation of movement.  Denies deformity or swelling.  Denies arthralgias or myalgias. Genitourinary: Denies pelvic pain.  Denies urinary frequency or hesitancy.  Denies dysuria.  Skin: Denies rash.  Denies petechiae, purpura, ecchymosis. Neurological: Denies syncope. Denies seizure activity. Denies paresthesia. Denies slurred speech, drooping face.  Denies visual change. Psychiatric: Denies depression, anxiety. Denies hallucinations.  Past Medical History:  Diagnosis Date   Human immunodeficiency virus (HIV) disease ([place]) 03/26/2020    Past Surgical History:  Procedure Laterality Date   IR FL GUIDED LOC OF NEEDLE/CATH TIP FOR SPINAL INJECTION LT  10/17/2020   SVT ABLATION N/A 04/23/2020   Procedure: SVT ABLATION;  Surgeon: Evans Lance, MD;  Location: [facility];  Service: Cardiovascular;  Laterality: N/A;    Social History  reports that he has quit smoking. His smoking use included cigarettes. He has quit using smokeless tobacco. He reports current alcohol use of about 14.0 - 21.0 standard drinks per week. He reports that he does not currently use drugs.  Allergies  Allergen Reactions   Bactrim [Sulfamethoxazole-Trimethoprim] Other (See Comments)    Patient was trialed on DS TIW and SS daily for PJP prophylaxis and developed hyperkalemia and increased  Scr    Family History  Problem Relation Age of Onset   Hypertension Mother    Kidney disease Mother    Hypertension Sister    Hypertension Brother      Prior to Admission medications   Medication  Sig Start Date End Date Taking? Authorizing Provider  acetaminophen (TYLENOL) 500 MG tablet Take 500 mg by mouth every 6 (six) hours as needed for moderate pain or headache.    [provider]  bictegravir-emtricitabine-tenofovir AF (BIKTARVY) 50-200-25 MG TABS tablet Take 1 tablet by mouth daily. 10/25/20   Zenia Resides, MD  fluticasone (FLONASE) 50 MCG/ACT nasal spray Place 1 spray into both nostrils as needed for allergies or rhinitis. 10/26/20 12/25/20  Donney Dice, DO  ibuprofen (ADVIL) 200 MG tablet Take 200 mg by mouth every 6 (six) hours as needed for headache or moderate pain.    [provider]  sulfamethoxazole-trimethoprim (BACTRIM) 400-80 MG tablet Take 1 tablet by mouth daily. 10/25/20   Zenia Resides, MD  verapamil (CALAN-SR) 120 MG CR tablet Take 1 tablet (120 mg total) by mouth daily. 10/27/20 12/26/20  Donney Dice, DO    Physical Exam: Vitals:   01/21/21 2100 01/21/21 2130 01/21/21 2200 01/22/21 0030  BP: 124/80 135/84 139/88 (!) 152/90  Pulse: 68 65 66 69  Resp:   15 18  Temp:      TempSrc:      SpO2: 99% 100% 99% 99%  Weight:      Height:        Constitutional: NAD, calm, comfortable Vitals:   01/21/21 2100 01/21/21 2130 01/21/21 2200 01/22/21 0030  BP: 124/80 135/84 139/88 (!) 152/90  Pulse: 68 65 66 69  Resp:   15 18  Temp:      TempSrc:      SpO2: 99% 100% 99% 99%  Weight:      Height:       General: Thin elderly male, Alert and oriented x3.  Eyes: EOMI, PERRL, conjunctivae normal.  Sclera nonicteric HENT:  [place]/AT, external ears normal.  Nares patent without epistasis.  Mucous membranes are moist Neck: Soft, normal range of motion, supple, no masses, no thyromegaly.  Trachea midline Respiratory: Equal breath sounds with diffuse rales. no wheezing, no crackles. Normal respiratory effort. No accessory muscle use.  Cardiovascular: Regular rate and rhythm, no murmurs / rubs / gallops. No extremity edema. Radial pulses +2 bilaterally   Abdomen: Soft, Has right upper abdomen tenderness, Positive Murphy's sign. nondistended, no rebound or guarding.  No masses palpated. No hepatosplenomegaly. Bowel sounds normoactive Musculoskeletal: FROM. no cyanosis. No joint deformity upper and lower extremities. Normal muscle tone.  Skin: Warm, dry, intact no rashes, lesions, ulcers. No induration Neurologic: CN 2-12 grossly intact. Normal speech. Strength 5/5 in all extremities.   Psychiatric: Normal mood. Flat affect. Pt is evasive to answering questions   Labs on Admission: I have personally reviewed following labs and imaging studies  CBC: Recent Labs  Lab 01/21/21 1526  WBC 2.2*  HGB 11.3*  HCT 35.0*  MCV 98.6  PLT 937    Basic Metabolic Panel: Recent Labs  Lab 01/21/21 1526  NA 127*  K 4.3  CL 96*  CO2 23  GLUCOSE 157*  BUN 5*  CREATININE 0.84  CALCIUM 8.3*    GFR: Estimated Creatinine Clearance: 98.2 mL/min (by C-G formula based on SCr of 0.84 mg/dL).  Liver Function Tests: Recent Labs  Lab 01/21/21 1526  AST 54*  ALT 12  ALKPHOS 235*  BILITOT 0.9  PROT 7.3  ALBUMIN 1.8*    Urine analysis:    Component Value Date/Time   COLORURINE ORANGE (A) 01/21/2021 1521   APPEARANCEUR CLOUDY (A) 01/21/2021 1521   LABSPEC 1.020 01/21/2021 1521   PHURINE 6.0 01/21/2021 1521   GLUCOSEU NEGATIVE 01/21/2021 1521   HGBUR TRACE (A) 01/21/2021 1521   BILIRUBINUR SMALL (A) 01/21/2021 1521   KETONESUR NEGATIVE 01/21/2021 1521   PROTEINUR NEGATIVE 01/21/2021 1521   NITRITE POSITIVE (A) 01/21/2021 1521   LEUKOCYTESUR MODERATE (A) 01/21/2021 1521    Radiological Exams on Admission: DG Chest 2 View  Result Date: 01/21/2021 CLINICAL DATA:  Productive cough. EXAM: CHEST - 2 VIEW COMPARISON:  10/12/2020 FINDINGS: The heart size and mediastinal contours are within normal limits. Atherosclerotic calcification of the aorta is noted. There is redemonstration of scarring and patchy opacities in the left upper lobe and at  the lung bases bilaterally, increased from the prior exam. No effusion or pneumothorax. No acute osseous abnormality. IMPRESSION: Interstitial and reticulonodular opacities in the left upper lobe and at the lung bases, increased from the prior exam and may be infectious or inflammatory. Electronically Signed   By: Brett Fairy M.D.   On: 01/21/2021 20:30   CT ABDOMEN PELVIS W CONTRAST  Result Date: 01/21/2021 CLINICAL DATA:  Abdominal pain for several weeks, initial encounter EXAM: CT ABDOMEN AND PELVIS WITH CONTRAST TECHNIQUE: Multidetector CT imaging of the abdomen and pelvis was performed using the standard protocol following bolus administration of intravenous contrast. CONTRAST:  144m OMNIPAQUE IOHEXOL 300 MG/ML  SOLN COMPARISON:  10/12/2020 FINDINGS: Lower chest: Lung base demonstrates some chronic infiltrative changes in the medial lower lobes similar to that seen on the prior exam. Emphysematous changes and changes of bronchiectasis are seen. There is an enlarging nodule in the anterolateral aspect of the left lower lobe measuring 1.6 x 1.4 cm. This previously measured up to 12 mm in dimension. It also has a more full rounded appearance when compared with the prior exam. These changes are suspicious for underlying neoplasm. Hepatobiliary: Liver is within normal limits. Mild periportal edema is seen. Gallbladder is well distended with some minimal pericholecystic inflammatory change. Pancreas: Changes of chronic pancreatitis are noted with diffuse calcifications identified. Spleen: Normal in size without focal abnormality. Adrenals/Urinary Tract: Adrenal glands are within normal limits bilaterally. Kidneys demonstrate a normal enhancement pattern bilaterally. Normal excretion is noted on delayed images. The bladder is partially distended. Stomach/Bowel: Colon shows no obstructive or inflammatory changes. The appendix is well visualized with barium within. No inflammatory changes to suggest appendicitis  are noted. Stomach is within normal limits. No obstructive changes in the small bowel are noted. There is herniation of at least a loop of small bowel into a right inguinal hernia. No definitive obstructive changes are seen although this may be related to the patient's underlying symptomatology of pain. Vascular/Lymphatic: Atherosclerotic calcifications are noted. Multiple prominent lymph nodes are noted in the small bowel mesentery. Additionally a pre pancreatic node measuring up to 15 mm in short axis is seen. Peri aortic and intra-aortocaval lymph nodes measuring up to 2.5 cm in short axis are seen. Reproductive: Prostate is unremarkable. Other: No abdominal wall hernia or abnormality. No abdominopelvic ascites. Musculoskeletal: No acute or significant osseous findings. IMPRESSION: Right inguinal hernia containing a few loops of small bowel without definitive obstructive change. This may be related to the patient's underlying discomfort. Enlarging soft tissue nodule in the left lower lobe measuring up to 1.6 cm. Although prior chest  CTs show some inflammatory changes this must be viewed with some suspicion for underlying neoplasm given its increase in size and bulk when compared with the prior study. Referral to multi disciplinary review board may be helpful to guide workup. Multiple mesenteric lymph nodes, pre pancreatic lymph node and multiple retroperitoneal enlarged lymph nodes are noted. Further workup of these nodes is recommended as well. These may be related to patient's underlying HIV status. Suggestion of wall thickening and mild Peri cholecystic fluid. Right upper quadrant ultrasound may be helpful for further evaluation. Electronically Signed   By: Inez Catalina M.D.   On: 01/21/2021 22:41   US Abdomen Limited RUQ (LIVER/GB)  Result Date: 01/21/2021 CLINICAL DATA:  Right upper quadrant pain. EXAM: ULTRASOUND ABDOMEN LIMITED RIGHT UPPER QUADRANT COMPARISON:  01/21/2021. FINDINGS: Gallbladder: No  cholelithiasis. There is mild gallbladder wall thickening with pericholecystic fluid. No sonographic Murphy sign noted by sonographer. Common bile duct: Diameter: 4.3 mm. Liver: No focal lesion identified. Within normal limits in parenchymal echogenicity. Portal vein is patent on color Doppler imaging with normal direction of blood flow towards the liver. Other: Small amount of ascites adjacent to the gallbladder. IMPRESSION: 1. Mild gallbladder wall thickening and pericholecystic fluid. No sonographic Murphy sign. Findings may be related to ascites or local inflammatory changes. 2. Mild ascites at the gallbladder fossa. Electronically Signed   By: Brett Fairy M.D.   On: 01/21/2021 23:57     Assessment/Plan  Principal Problem:   Acute cholecystitis Mr. Broadwell is admitted to Med Surg floor. He has RUQ abdominal pain and mild gallbladder wall thickening on u/s and CT scan. No stone visualized.  Surgery is consulted to provide further recommendation on surgical option Pain control provided with toradal for moderate pain and dilaudid for severe pain overnight.  Zofran as needed for nausea Started on Zosyn for antibiotic coverage.  IVF hydration with LR at 75 ml/hr  Active Problems:   Complicated UTI (urinary tract infection) Placed on zosyn for antibiotic coverage. Urine culture obtained in ER. Will monitor culture results    Leukocytopenia Monitor and repeat CBC in am. Will obtain differential withi CBC in am    HIV infection  Chronic. Pt has not followed up for care and has not been taking antiretroviral therapy. Consult ID in the morning    Hypoalbuminemia Chronic.  Patient would benefit from nutrition consult    Moderate protein malnutrition Consult nutrition in the morning    Noncompliance It is stressed to patient the need for compliance with medication regimen to prevent unwanted outcomes such as worsening of condition and death    DVT prophylaxis: Lovenox for DVT prophylaxis   Code Status:   Full Code  Family Communication:  Diagnosis and plan was discussed with patient.  Patient states he understands.  Further recommendation follow as clinical indicated Disposition Plan:   Patient is from:  Home  Anticipated DC to:  Home  Anticipated DC date:  Anticipate 2 midnight or more stay in the hospital  Time Spent on Admission:       75 minutes   Consults called:  General Surgery, Dr. Kieth Brightly with [facility]  Admission status:  Inpatient   Yevonne Aline Vina Byrd MD [facility]  How to contact the [facility] Attending or Consulting provider [place] or covering provider during after hours [place], for this patient?   Check the care team in [facility] and look for a) attending/consulting [facility] provider listed and b) the [facility] team listed Log into  [email or web address] and use [place]'s universal password to access. If you do not have the password, please contact the hospital operator. Locate the [facility] provider you are looking for under [facility] and page to a number that you can be directly reached. If you still have difficulty reaching the provider, please page the [facility] (Director on Call) for the Hospitalists listed on amion for assistance.  01/22/2021, 1:35 AM

## 2021-01-23 ENCOUNTER — Other Ambulatory Visit (HOSPITAL_COMMUNITY): Payer: Self-pay

## 2021-01-23 DIAGNOSIS — Z91199 Patient's noncompliance with other medical treatment and regimen due to unspecified reason: Secondary | ICD-10-CM

## 2021-01-23 DIAGNOSIS — R7401 Elevation of levels of liver transaminase levels: Secondary | ICD-10-CM

## 2021-01-23 DIAGNOSIS — E871 Hypo-osmolality and hyponatremia: Secondary | ICD-10-CM

## 2021-01-23 DIAGNOSIS — F1490 Cocaine use, unspecified, uncomplicated: Secondary | ICD-10-CM

## 2021-01-23 DIAGNOSIS — F17200 Nicotine dependence, unspecified, uncomplicated: Secondary | ICD-10-CM

## 2021-01-23 DIAGNOSIS — F1010 Alcohol abuse, uncomplicated: Secondary | ICD-10-CM

## 2021-01-23 DIAGNOSIS — E8809 Other disorders of plasma-protein metabolism, not elsewhere classified: Secondary | ICD-10-CM

## 2021-01-23 LAB — COMPREHENSIVE METABOLIC PANEL
ALT: 12 U/L (ref 0–44)
AST: 44 U/L — ABNORMAL HIGH (ref 15–41)
Albumin: 1.7 g/dL — ABNORMAL LOW (ref 3.5–5.0)
Alkaline Phosphatase: 202 U/L — ABNORMAL HIGH (ref 38–126)
Anion gap: 7 (ref 5–15)
BUN: 7 mg/dL — ABNORMAL LOW (ref 8–23)
CO2: 23 mmol/L (ref 22–32)
Calcium: 8.2 mg/dL — ABNORMAL LOW (ref 8.9–10.3)
Chloride: 96 mmol/L — ABNORMAL LOW (ref 98–111)
Creatinine, Ser: 0.81 mg/dL (ref 0.61–1.24)
GFR, Estimated: >60 mL/min (ref >60)
Glucose, Bld: 124 mg/dL — ABNORMAL HIGH (ref 70–99)
Potassium: 3.7 mmol/L (ref 3.5–5.1)
Sodium: 126 mmol/L — ABNORMAL LOW (ref 135–145)
Total Bilirubin: 1.3 mg/dL — ABNORMAL HIGH (ref 0.3–1.2)
Total Protein: 7.1 g/dL (ref 6.5–8.1)

## 2021-01-23 LAB — NA AND K (SODIUM & POTASSIUM), RAND UR
Potassium Urine: 28 mmol/L
Sodium, Ur: 106 mmol/L

## 2021-01-23 LAB — BLOOD GAS, VENOUS
Acid-base deficit: 0.3 mmol/L (ref 0.0–2.0)
Bicarbonate: 23.3 mmol/L (ref 20.0–28.0)
Drawn by: 5896
FIO2: 21
O2 Saturation: 93 %
Patient temperature: 36.9
pCO2, Ven: 33.9 mmHg — ABNORMAL LOW (ref 44.0–60.0)
pH, Ven: 7.451 — ABNORMAL HIGH (ref 7.250–7.430)
pO2, Ven: 66.4 mmHg — ABNORMAL HIGH (ref 32.0–45.0)

## 2021-01-23 LAB — CBC WITH DIFFERENTIAL/PLATELET
Abs Immature Granulocytes: 0 10*3/uL (ref 0.00–0.07)
Basophils Absolute: 0 10*3/uL (ref 0.0–0.1)
Basophils Relative: 0 %
Eosinophils Absolute: 0 10*3/uL (ref 0.0–0.5)
Eosinophils Relative: 0 %
HCT: 32.8 % — ABNORMAL LOW (ref 39.0–52.0)
Hemoglobin: 10.9 g/dL — ABNORMAL LOW (ref 13.0–17.0)
Lymphocytes Relative: 3 %
Lymphs Abs: 0.1 10*3/uL — ABNORMAL LOW (ref 0.7–4.0)
MCH: 31.9 pg (ref 26.0–34.0)
MCHC: 33.2 g/dL (ref 30.0–36.0)
MCV: 95.9 fL (ref 80.0–100.0)
Metamyelocytes Relative: 1 %
Monocytes Absolute: 0.4 10*3/uL (ref 0.1–1.0)
Monocytes Relative: 8 %
Neutro Abs: 4.2 10*3/uL (ref 1.7–7.7)
Neutrophils Relative %: 88 %
Platelets: 229 10*3/uL (ref 150–400)
RBC: 3.42 MIL/uL — ABNORMAL LOW (ref 4.22–5.81)
RDW: 15.2 % (ref 11.5–15.5)
WBC: 4.8 10*3/uL (ref 4.0–10.5)
nRBC: 0 % (ref 0.0–0.2)
nRBC: 0 /100 WBC

## 2021-01-23 LAB — HIV-1 RNA QUANT-NO REFLEX-BLD
HIV 1 RNA Quant: 111000 copies/mL
LOG10 HIV-1 RNA: 5.045 log10copy/mL

## 2021-01-23 LAB — CREATININE, URINE, RANDOM: Creatinine, Urine: 66.17 mg/dL

## 2021-01-23 LAB — OSMOLALITY: Osmolality: 272 mOsm/kg — ABNORMAL LOW (ref 275–295)

## 2021-01-23 LAB — MAGNESIUM: Magnesium: 1.6 mg/dL — ABNORMAL LOW (ref 1.7–2.4)

## 2021-01-23 LAB — ACTH STIMULATION, 3 TIME POINTS: Cortisol, Base: 15.3 ug/dL

## 2021-01-23 LAB — TSH: TSH: 0.549 u[IU]/mL (ref 0.350–4.500)

## 2021-01-23 LAB — LACTATE DEHYDROGENASE: LDH: 186 U/L (ref 98–192)

## 2021-01-23 MED ORDER — LORAZEPAM 1 MG PO TABS: 1.0000 mg | ORAL_TABLET | ORAL | Status: DC | PRN

## 2021-01-23 MED ORDER — ENSURE ENLIVE PO LIQD
237.0000 mL | Freq: Two times a day (BID) | ORAL | Status: DC
  Administered 2021-01-23 – 2021-01-24 (×3): 237 mL via ORAL

## 2021-01-23 MED ORDER — ADULT MULTIVITAMIN W/MINERALS CH
1.0000 | ORAL_TABLET | Freq: Every day | ORAL | Status: DC
  Administered 2021-01-23 – 2021-01-24 (×2): 1 via ORAL
  Filled 2021-01-23 (×2): qty 1

## 2021-01-23 MED ORDER — THIAMINE HCL 100 MG PO TABS
100.0000 mg | ORAL_TABLET | Freq: Every day | ORAL | Status: DC
  Administered 2021-01-23 – 2021-01-24 (×2): 100 mg via ORAL
  Filled 2021-01-23 (×2): qty 1

## 2021-01-23 MED ORDER — LORAZEPAM 2 MG/ML IJ SOLN
1.0000 mg | INTRAMUSCULAR | Status: DC | PRN
  Administered 2021-01-23: 18:00:00 2 mg via INTRAVENOUS
  Filled 2021-01-23: qty 1

## 2021-01-23 MED ORDER — MAGNESIUM SULFATE 2 GM/50ML IV SOLN
2.0000 g | Freq: Once | INTRAVENOUS | Status: AC
  Administered 2021-01-23: 18:00:00 2 g via INTRAVENOUS
  Filled 2021-01-23 (×2): qty 50

## 2021-01-23 MED ORDER — SULFAMETHOXAZOLE-TRIMETHOPRIM 400-80 MG PO TABS
1.0000 | ORAL_TABLET | Freq: Every day | ORAL | 0 refills | Status: DC
  Filled 2021-01-23: qty 30, 30d supply, fill #0

## 2021-01-23 MED ORDER — THIAMINE HCL 100 MG/ML IJ SOLN: 100.0000 mg | Freq: Every day | INTRAMUSCULAR | Status: DC

## 2021-01-23 MED ORDER — BIKTARVY 50-200-25 MG PO TABS
1.0000 | ORAL_TABLET | Freq: Every day | ORAL | 0 refills | Status: DC
  Filled 2021-01-23: qty 30, 30d supply, fill #0

## 2021-01-23 MED ORDER — FOLIC ACID 1 MG PO TABS
1.0000 mg | ORAL_TABLET | Freq: Every day | ORAL | Status: DC
  Administered 2021-01-23 – 2021-01-24 (×2): 1 mg via ORAL
  Filled 2021-01-23 (×2): qty 1

## 2021-01-23 MED ORDER — SODIUM CHLORIDE (PF) 0.9 % IJ SOLN
INTRAMUSCULAR | Status: AC
  Administered 2021-01-23: 05:00:00 1 mL
  Filled 2021-01-23: qty 10

## 2021-01-23 MED ORDER — SULFAMETHOXAZOLE-TRIMETHOPRIM 400-80 MG PO TABS
1.0000 | ORAL_TABLET | Freq: Every day | ORAL | Status: DC
  Administered 2021-01-24: 09:00:00 1 via ORAL
  Filled 2021-01-23: qty 1

## 2021-01-23 NOTE — Progress Notes (Addendum)
PROGRESS NOTE  Ricky [name] MHD:[id number] DOB: 19-May-1955   PCP: Pcp, No  Patient is from: Home.  Lives with sister.  Independently ambulates at baseline.  DOA: 01/21/2021 LOS: 1  Chief complaints:  Chief Complaint  Patient presents with   Abdominal Pain     Brief Narrative / Interim history: 65 year old M with PMH of HIV/AIDS,pulmonary TB, cryptococcal meningitis, HTN, noncompliance, polysubstance use including EtOH abuse, tobacco use disorder and cocaine presenting with RUQ pain, nausea, vomiting and cough, admitted with complicated UTI and possible cholecystitis.  Patient was started on IV Zosyn.  General surgery consulted.  Cholecystitis felt to be less likely.  Infectious disease consulted the next day.  UTI felt to be less likely, and antibiotic discontinued.  ID restarted Biktarvy and fluconazole, and ordered Fungitell, LDH, crypto antigen, histo/blasto serology, AFB blood culture, urine fungal culture, CT chest and work-up for AI  Subjective: Seen and examined earlier this morning.  Continues to endorse productive cough with whitish phlegm.  He denies hemoptysis.  Reports improvement in abdominal pain.  He reports UTI symptoms but chronic.  He tolerated clear liquid diet today.  Objective: Vitals:   01/22/21 2108 01/23/21 0016 01/23/21 0432 01/23/21 0906  BP: 95/68 121/79 118/84 (!) 118/58  Pulse: 77 68 83   Resp: 17 17 18    Temp: 97.9 F (36.6 C) 97.8 F (36.6 C) 100.2 F (37.9 C)   TempSrc:   Oral   SpO2: 99% 100% 100%   Weight:      Height:        Examination:  GENERAL: Frail looking elderly male.  No apparent distress. HEENT: MMM.  Vision and hearing grossly intact.  NECK: Supple.  No apparent JVD.  RESP: 100% on RA.  No IWOB.  Fair aeration bilaterally. CVS:  RRR. Heart sounds normal.  ABD/GI/GU: BS+. Abd soft, NTND.  MSK/EXT:  Moves extremities. No apparent deformity. No edema.  SKIN: no apparent skin lesion or wound NEURO: Awake, alert and oriented  appropriately.  No apparent focal neuro deficit. PSYCH: Calm. Normal affect.   Procedures:  None  Microbiology summarized: COVID-19 PCR positive  Assessment & Plan: HIV/AIDS-patient is noncompliant with his meds.  CD4 count <35 on 12/20.  Last HIV 1 RNA by PCR 68,000.  Crypto antigen positive but low titer at 20 likely from previous infection. -Continue Biktarvy, Bactrim and Diflucan per ID -Follow other ID labs -ID following  RUQ pain/nausea/vomiting-likely from alcohol and cocaine use versus cholecystitis.  Improved. -Advance to full liquid diet -Antiemetics as needed  Alcohol abuse: reports drinking up to 2 of the 40 ounce beers every night.  No withdrawal symptoms. -Closely monitor for withdrawal symptoms -Start CIWA, vitamins, thiamine and folic acid  Tobacco use disorder- -Not interested in nicotine patch.  Cocaine abuse -Counseled. -TOC consulted  Noncompliance -Counseled. -TOC consulted  Chronic COVID-19 infection: Persistently positive for months now. -ID recommends airborne isolation precaution for the period of this hospitalization  Hyponatremia: Could be SIADH, beer potomania and iatrogenic from Bactrim.  TSH and a.m. cortisol within normal. Recent Labs  Lab 01/21/21 1526 01/22/21 0220 01/23/21 0253  NA 127* 125* 126*  -Recheck in the morning -May consider p.o. sodium chloride if no improvement   Hypomagnesemia: Mg 1.6. -IV magnesium sulfate  Elevated AST: Likely due to alcohol.  Improved.  Pyuria/bacteriuria-UA concerning.  Patient reports UTI symptoms although chronic.   -Antibiotic discontinued by ID.  Increased nutrient needs Body mass index is 22.43 kg/m.  -Dietitian consulted.  DVT prophylaxis:  enoxaparin (LOVENOX) injection 40 mg Start: 01/22/21 1000  Code Status: Full code Family Communication: Patient and/or RN. Available if any question.  Level of care: Med-Surg Status is: Inpatient  Remains inpatient appropriate  because: Intractable nausea/vomiting and RUQ pain   Final disposition: Likely home once medically clear.    Consultants:  General surgery Infectious disease   Sch Meds:  Scheduled Meds:  bictegravir-emtricitabine-tenofovir AF  1 tablet Oral Daily   enoxaparin (LOVENOX) injection  40 mg Subcutaneous Q24H   fluconazole  400 mg Oral Daily   nicotine  14 mg Transdermal Daily   [START ON 01/24/2021] sulfamethoxazole-trimethoprim  1 tablet Oral Daily   Continuous Infusions:  sodium chloride 100 mL/hr at 01/22/21 2130   magnesium sulfate bolus IVPB     PRN Meds:.acetaminophen **OR** acetaminophen, ketorolac, ondansetron **OR** ondansetron (ZOFRAN) IV, senna-docusate  Antimicrobials: Anti-infectives (From admission, onward)    Start     Dose/Rate Route Frequency Ordered Stop   01/24/21 1000  sulfamethoxazole-trimethoprim (BACTRIM) 400-80 MG per tablet 1 tablet        1 tablet Oral Daily 01/23/21 0926     01/22/21 1230  bictegravir-emtricitabine-tenofovir AF (BIKTARVY) 50-200-25 MG per tablet 1 tablet        1 tablet Oral Daily 01/22/21 1218     01/22/21 1230  fluconazole (DIFLUCAN) tablet 400 mg        400 mg Oral Daily 01/22/21 1218     01/22/21 1230  atovaquone (MEPRON) 750 MG/5ML suspension 1,500 mg  Status:  Discontinued        1,500 mg Oral Daily with breakfast 01/22/21 1228 01/23/21 0926   01/22/21 0600  piperacillin-tazobactam (ZOSYN) IVPB 3.375 g  Status:  Discontinued        3.375 g 12.5 mL/hr over 240 Minutes Intravenous Every 8 hours 01/22/21 0223 01/22/21 1028   01/22/21 0045  cefTRIAXone (ROCEPHIN) 2 g in sodium chloride 0.9 % 100 mL IVPB        2 g 200 mL/hr over 30 Minutes Intravenous  Once 01/22/21 0040 01/22/21 0200   01/22/21 0045  metroNIDAZOLE (FLAGYL) IVPB 500 mg  Status:  Discontinued        500 mg 100 mL/hr over 60 Minutes Intravenous Every 12 hours 01/22/21 0040 01/22/21 0225   01/22/21 0000  fluconazole (DIFLUCAN) 200 MG tablet        400 mg Oral Daily  01/22/21 1259          I have personally reviewed the following labs and images: CBC: Recent Labs  Lab 01/21/21 1526 01/22/21 0220 01/23/21 0253  WBC 2.2* 3.6* 4.8  NEUTROABS  --  3.2 4.2  HGB 11.3* 11.7* 10.9*  HCT 35.0* 35.5* 32.8*  MCV 98.6 95.4 95.9  PLT 216 145* 229   BMP &GFR Recent Labs  Lab 01/21/21 1526 01/22/21 0220 01/23/21 0253  NA 127* 125* 126*  K 4.3 5.4* 3.7  CL 96* 96* 96*  CO2 23 22 23   GLUCOSE 157* 89 124*  BUN 5* 6* 7*  CREATININE 0.84 0.74 0.81  CALCIUM 8.3* 7.8* 8.2*  MG  --   --  1.6*   Estimated Creatinine Clearance: 101.8 mL/min (by C-G formula based on SCr of 0.81 mg/dL). Liver & Pancreas: Recent Labs  Lab 01/21/21 1526 01/22/21 0220 01/23/21 0253  AST 54* 79* 44*  ALT 12 11 12   ALKPHOS 235* 221* 202*  BILITOT 0.9 1.6* 1.3*  PROT 7.3 6.7 7.1  ALBUMIN 1.8* 1.7* 1.7*  Recent Labs  Lab 01/21/21 1526  LIPASE 33   No results for input(s): AMMONIA in the last 168 hours. Diabetic: No results for input(s): HGBA1C in the last 72 hours. No results for input(s): GLUCAP in the last 168 hours. Cardiac Enzymes: No results for input(s): CKTOTAL, CKMB, CKMBINDEX, TROPONINI in the last 168 hours. No results for input(s): PROBNP in the last 8760 hours. Coagulation Profile: No results for input(s): INR, PROTIME in the last 168 hours. Thyroid Function Tests: Recent Labs    01/23/21 0253  TSH 0.549   Lipid Profile: No results for input(s): CHOL, HDL, LDLCALC, TRIG, CHOLHDL, LDLDIRECT in the last 72 hours. Anemia Panel: No results for input(s): VITAMINB12, FOLATE, FERRITIN, TIBC, IRON, RETICCTPCT in the last 72 hours. Urine analysis:    Component Value Date/Time   COLORURINE ORANGE (A) 01/21/2021 1521   APPEARANCEUR CLOUDY (A) 01/21/2021 1521   LABSPEC 1.020 01/21/2021 1521   PHURINE 6.0 01/21/2021 1521   GLUCOSEU NEGATIVE 01/21/2021 1521   HGBUR TRACE (A) 01/21/2021 1521   BILIRUBINUR SMALL (A) 01/21/2021 1521   KETONESUR  NEGATIVE 01/21/2021 1521   PROTEINUR NEGATIVE 01/21/2021 1521   NITRITE POSITIVE (A) 01/21/2021 1521   LEUKOCYTESUR MODERATE (A) 01/21/2021 1521   Sepsis Labs: Invalid input(s): PROCALCITONIN, [place]  Microbiology: Recent Results (from the past 240 hour(s))  Resp Panel by RT-PCR (Flu A&B, Covid) Nasopharyngeal Swab     Status: Abnormal   Collection Time: 01/21/21 10:56 PM   Specimen: Nasopharyngeal Swab; Nasopharyngeal(NP) swabs in vial transport medium  Result Value Ref Range Status   SARS Coronavirus 2 by RT PCR POSITIVE (A) NEGATIVE Final    Comment: (NOTE) SARS-CoV-2 target nucleic acids are DETECTED.  The SARS-CoV-2 RNA is generally detectable in upper respiratory specimens during the acute phase of infection. Positive results are indicative of the presence of the identified virus, but do not rule out bacterial infection or co-infection with other pathogens not detected by the test. Clinical correlation with patient history and other diagnostic information is necessary to determine patient infection status. The expected result is Negative.  Fact Sheet for Patients: EntrepreneurPulse.com.au  Fact Sheet for Healthcare Providers: IncredibleEmployment.be  This test is not yet approved or cleared by the [place] FDA and  has been authorized for detection and/or diagnosis of SARS-CoV-2 by FDA under an Emergency Use Authorization (EUA).  This EUA will remain in effect (meaning this test can be used) for the duration of  the COVID-19 declaration under Section 564(b)(1) of the A ct, 21 U.S.C. section 360bbb-3(b)(1), unless the authorization is terminated or revoked sooner.     Influenza A by PCR NEGATIVE NEGATIVE Final   Influenza B by PCR NEGATIVE NEGATIVE Final    Comment: (NOTE) The Xpert Xpress SARS-CoV-2/FLU/RSV plus assay is intended as an aid in the diagnosis of influenza from Nasopharyngeal swab specimens and should not be  used as a sole basis for treatment. Nasal washings and aspirates are unacceptable for Xpert Xpress SARS-CoV-2/FLU/RSV testing.  Fact Sheet for Patients: EntrepreneurPulse.com.au  Fact Sheet for Healthcare Providers: IncredibleEmployment.be  This test is not yet approved or cleared by the [place] FDA and has been authorized for detection and/or diagnosis of SARS-CoV-2 by FDA under an Emergency Use Authorization (EUA). This EUA will remain in effect (meaning this test can be used) for the duration of the COVID-19 declaration under Section 564(b)(1) of the Act, 21 U.S.C. section 360bbb-3(b)(1), unless the authorization is terminated or revoked.  Performed at [facility], [place]  [address], [place], [place] [address]     Radiology Studies: CT CHEST WO CONTRAST  Result Date: 01/22/2021 CLINICAL DATA:  Cough EXAM: CT CHEST WITHOUT CONTRAST TECHNIQUE: Multidetector CT imaging of the chest was performed following the standard protocol without IV contrast. COMPARISON:  CT chest 10/12/2020 FINDINGS: Cardiovascular: Heart size is normal. Small pericardial effusion. Coronary artery calcifications noted. Main pulmonary artery is normal caliber. Thoracic aorta is tortuous and normal caliber with mild calcified plaques. Mediastinum/Nodes: Numerous enlarged mediastinal lymph nodes identified measuring up to 1.4 cm in short axis near the AP window and 2.9 cm in AP dimension subcarinal lymph node. Enlarged left hilar lymph nodes measuring up to approximately 1.7 cm in short axis, limited evaluation without contrast. No bulky axillary lymphadenopathy identified. Lungs/Pleura: Lungs are hyperinflated with prominent reticular interstitial densities bilaterally which are chronic. Stable chronic left apical pleural thickening. Redemonstration of an area of bronchiectasis and associated interstitial consolidative densities in the posterior aspect of the  left upper lobe, grossly unchanged. Interval increasing bronchiectatic changes in the left lower lobe with associated linear pleural and parenchymal likely scarring densities. Bronchial wall thickening bilaterally more prominent in the lower lobes. Multiple chronic pulmonary nodular densities are again seen on the left including stable 12 mm nodule in the upper lobe image 35, 12 mm nodule in the upper lobe image 40 and increased size of an irregular nodule in the anterior left lower lobe measuring 20 x 16 mm. A few patchy small ground-glass airspace densities are noted in the posterior right lung which appear to be new since previous study. Trace left pleural effusion. Upper Abdomen: No definite acute abnormality identified in the upper abdomen. Evidence of chronic pancreatitis with numerous calcifications. Colonic diverticulosis. Likely nephrolithiasis. Musculoskeletal: No suspicious bony lesions identified. IMPRESSION: 1. Mild interval progression of likely infectious/inflammatory densities mostly in the left lung including persistent chronic left lung nodules with the largest nodule increasing since previous study in the left lower lobe. Consider follow-up CT in 3 months. 2. Trace left pleural effusion.  Small pericardial effusion. 3. Bulky mediastinal and left hilar lymphadenopathy, similar to previous study. 4. Evidence of chronic pancreatitis. 5. Other findings as described. Electronically Signed   By: Ofilia Neas M.D.   On: 01/22/2021 14:49      Jazzlin Clements [address] [place]  If 7PM-7AM, please contact night-coverage [email or web address] 01/23/2021, 12:49 PM

## 2021-01-23 NOTE — Progress Notes (Signed)
Initial Nutrition Assessment  DOCUMENTATION CODES:   Non-severe (moderate) malnutrition in context of chronic illness  INTERVENTION:   Advance diet as medically appropriate per MD Encourage good PO intake  Multivitamin w/ minerals daily Ensure Enlive po BID, each supplement provides 350 kcal and 20 grams of protein  NUTRITION DIAGNOSIS:   Moderate Malnutrition related to chronic illness as evidenced by moderate muscle depletion, severe fat depletion.  GOAL:   Patient will meet greater than or equal to 90% of their needs  MONITOR:   PO intake, Diet advancement, Supplement acceptance, Weight trends  REASON FOR ASSESSMENT:   Consult Assessment of nutrition requirement/status  ASSESSMENT:   65 y.o. male presented to the ED with abdominal pain. PMH includes HIV, HTN, and drug abuse. Pt admitted with cholecystitis, UTI, and non-compliment with antiretroviral medications for HIV infection.   Pt reports that he has not been eating well for about a month now. When asked what he eats throughout the day, pt states that he eats what normal people eat. Pt then reports that he typically has: Breakfast: grits, eggs, sausage  Lunch: leftovers Dinner: chicken, pork chops, hamburger helper  Pt states that he has been eating what he has been receiving on his trays. Reports that pain is residing, reports that pain is in lower abdomen. Denies any nausea or throwing up.  Pt reports that his UBW was 220# in 2019. When asked about weight loss states that he has "Lost too much weight." Per EMR, pt has not had any weight loss within the past year.   Discussed ONS with pt; pt agreeable to Ensure Enlive if vanilla or strawberry flavor.  Per surgery note, findings are not consistent with acute cholecystitis; no surgery or gallbladder procedure.  Medications reviewed and include: Fluconazole, Folic acid, MVI, Bactrim, Thiamine, Magnesium Sulfate Labs reviewed: Sodium 126, Magnesium 1.6    NUTRITION - FOCUSED PHYSICAL EXAM:  Flowsheet Row Most Recent Value  Orbital Region Severe depletion  Upper Arm Region Severe depletion  Thoracic and Lumbar Region Severe depletion  Buccal Region Severe depletion  Temple Region Moderate depletion  Clavicle Bone Region Moderate depletion  Clavicle and Acromion Bone Region Moderate depletion  Scapular Bone Region Moderate depletion  Dorsal Hand Moderate depletion  Patellar Region Moderate depletion  Anterior Thigh Region Moderate depletion  Posterior Calf Region Mild depletion  Edema (RD Assessment) None  Hair Reviewed  Eyes Reviewed  Mouth Reviewed  Skin Reviewed  Nails Reviewed       Diet Order:   Diet Order             Diet full liquid Room service appropriate? Yes; Fluid consistency: Thin  Diet effective now                   EDUCATION NEEDS:   No education needs have been identified at this time  Skin:  Skin Assessment: Reviewed RN Assessment  Last BM:  01/20/2021  Height:   Ht Readings from Last 1 Encounters:  01/21/21 6\' 1"  (1.854 m)    Weight:   Wt Readings from Last 1 Encounters:  01/21/21 77.1 kg    Ideal Body Weight:  83.6 kg  BMI:  Body mass index is 22.43 kg/m.  Estimated Nutritional Needs:   Kcal:  2119-4174  Protein:  115-130 grams  Fluid:  >/= 2.3 L    Chrissie Dacquisto Louie Casa, RD, LDN Clinical Dietitian See [facility] for contact information.

## 2021-01-23 NOTE — Progress Notes (Addendum)
Writer administered IV Cortrosyn at 0430, lab tech/phlebotomist [place] is aware to draw blood at 0500(81mins after the dose).  2301 writer tried to call phlebotomist x3 attempts. Unable to reach her.

## 2021-01-24 ENCOUNTER — Other Ambulatory Visit (HOSPITAL_COMMUNITY): Payer: Self-pay

## 2021-01-24 DIAGNOSIS — R8281 Pyuria: Secondary | ICD-10-CM

## 2021-01-24 DIAGNOSIS — D72819 Decreased white blood cell count, unspecified: Secondary | ICD-10-CM

## 2021-01-24 DIAGNOSIS — R509 Fever, unspecified: Secondary | ICD-10-CM

## 2021-01-24 DIAGNOSIS — E440 Moderate protein-calorie malnutrition: Secondary | ICD-10-CM

## 2021-01-24 DIAGNOSIS — R112 Nausea with vomiting, unspecified: Secondary | ICD-10-CM

## 2021-01-24 DIAGNOSIS — R8271 Bacteriuria: Secondary | ICD-10-CM

## 2021-01-24 DIAGNOSIS — R1011 Right upper quadrant pain: Secondary | ICD-10-CM

## 2021-01-24 LAB — CBC
HCT: 26.3 % — ABNORMAL LOW (ref 39.0–52.0)
Hemoglobin: 9.1 g/dL — ABNORMAL LOW (ref 13.0–17.0)
MCH: 32 pg (ref 26.0–34.0)
MCHC: 34.6 g/dL (ref 30.0–36.0)
MCV: 92.6 fL (ref 80.0–100.0)
Platelets: 234 10*3/uL (ref 150–400)
RBC: 2.84 MIL/uL — ABNORMAL LOW (ref 4.22–5.81)
RDW: 15.1 % (ref 11.5–15.5)
WBC: 3.5 10*3/uL — ABNORMAL LOW (ref 4.0–10.5)
nRBC: 0 % (ref 0.0–0.2)

## 2021-01-24 LAB — COMPREHENSIVE METABOLIC PANEL
ALT: 10 U/L (ref 0–44)
AST: 37 U/L (ref 15–41)
Albumin: <1.5 g/dL — ABNORMAL LOW (ref 3.5–5.0)
Alkaline Phosphatase: 176 U/L — ABNORMAL HIGH (ref 38–126)
Anion gap: 4 — ABNORMAL LOW (ref 5–15)
BUN: 6 mg/dL — ABNORMAL LOW (ref 8–23)
CO2: 24 mmol/L (ref 22–32)
Calcium: 7.7 mg/dL — ABNORMAL LOW (ref 8.9–10.3)
Chloride: 101 mmol/L (ref 98–111)
Creatinine, Ser: 0.69 mg/dL (ref 0.61–1.24)
GFR, Estimated: >60 mL/min (ref >60)
Glucose, Bld: 152 mg/dL — ABNORMAL HIGH (ref 70–99)
Potassium: 3.3 mmol/L — ABNORMAL LOW (ref 3.5–5.1)
Sodium: 129 mmol/L — ABNORMAL LOW (ref 135–145)
Total Bilirubin: 0.9 mg/dL (ref 0.3–1.2)
Total Protein: 5.9 g/dL — ABNORMAL LOW (ref 6.5–8.1)

## 2021-01-24 LAB — MAGNESIUM: Magnesium: 1.9 mg/dL (ref 1.7–2.4)

## 2021-01-24 LAB — LACTATE DEHYDROGENASE: LDH: 128 U/L (ref 98–192)

## 2021-01-24 LAB — LIPASE, BLOOD: Lipase: 22 U/L (ref 11–51)

## 2021-01-24 LAB — CK: Total CK: 18 U/L — ABNORMAL LOW (ref 49–397)

## 2021-01-24 LAB — PHOSPHORUS: Phosphorus: 2.1 mg/dL — ABNORMAL LOW (ref 2.5–4.6)

## 2021-01-24 MED ORDER — ONDANSETRON HCL 4 MG PO TABS: 4.0000 mg | ORAL_TABLET | Freq: Four times a day (QID) | ORAL | 0 refills | Status: DC | PRN

## 2021-01-24 MED ORDER — THIAMINE HCL 100 MG PO TABS: 100.0000 mg | ORAL_TABLET | Freq: Every day | ORAL | 1 refills | Status: DC

## 2021-01-24 MED ORDER — POTASSIUM CHLORIDE CRYS ER 20 MEQ PO TBCR
40.0000 meq | EXTENDED_RELEASE_TABLET | ORAL | Status: AC
  Administered 2021-01-24 (×2): 40 meq via ORAL
  Filled 2021-01-24 (×2): qty 2

## 2021-01-24 MED ORDER — FOLIC ACID 1 MG PO TABS: 1.0000 mg | ORAL_TABLET | Freq: Every day | ORAL | 0 refills | Status: DC

## 2021-01-24 MED ORDER — POLYETHYLENE GLYCOL 3350 17 GM/SCOOP PO POWD: 17.0000 g | Freq: Two times a day (BID) | ORAL | 0 refills | Status: DC | PRN

## 2021-01-24 MED ORDER — NICOTINE 14 MG/24HR TD PT24: 14.0000 mg | MEDICATED_PATCH | Freq: Every day | TRANSDERMAL | 0 refills | Status: DC

## 2021-01-24 MED ORDER — PANTOPRAZOLE SODIUM 40 MG PO TBEC: 40.0000 mg | DELAYED_RELEASE_TABLET | Freq: Every day | ORAL | 1 refills | Status: DC

## 2021-01-24 MED ORDER — ADULT MULTIVITAMIN W/MINERALS CH: 1.0000 | ORAL_TABLET | Freq: Every day | ORAL | Status: DC

## 2021-01-24 NOTE — Plan of Care (Signed)

## 2021-01-24 NOTE — Discharge Summary (Signed)
Physician Discharge Summary  Ricky [name] IFO:[id number] DOB: [birth year] DOA: 01/21/2021  PCP: Pcp, No  Admit date: 01/21/2021 Discharge date: 01/24/2021 Admitted From: Home  Disposition: Home Recommendations for Outpatient Follow-up:  Infectious disease regarding outpatient follow-up. Please obtain CBC/BMP/Mag at follow up Please follow up on the following pending results: AFB culture, serum Fungitell, histoplasma antigen, Blastomyces antigen Home Health: Not indicated Equipment/Devices: Not indicated Discharge Condition: Stable CODE STATUS: Full code  Hospital Course: 66 year old M with PMH of HIV/AIDS,pulmonary TB, cryptococcal meningitis, HTN, noncompliance, polysubstance use including EtOH abuse, tobacco use disorder and cocaine presenting with RUQ pain, nausea, vomiting and cough, admitted with complicated UTI and possible cholecystitis.  Patient was started on IV Zosyn.  General surgery consulted.  Cholecystitis felt to be less likely.  Infectious disease consulted the next day.  UTI felt to be less likely, and antibiotic discontinued.  ID restarted Biktarvy, Bactrim and fluconazole.  Crypto antigen positive but low titer.  LDH within normal.  CT chest with mild interval progression of likely infectious/inflammatory densities mostly in the left lung, bulky mediastinal and left hilar LAD and evidence of chronic pancreatitis.  Fungitell, histo/blasto serology, AFB blood culture and urine fungal culture pending.  On the day of discharge, nausea and vomiting resolved.  He tolerated soft diet.  Cleared for discharge by ID on Biktarvy, fluconazole and Bactrim.  Dose medicines delivered to bedside prior to discharge.  ID to follow-up on pending infectious work-up and arrange outpatient follow-up.   Patient has been counseled on tobacco, cocaine and alcohol cessation.   See individual problem list below for more on hospital course.  Discharge Diagnoses:  HIV/AIDS-patient is  noncompliant with his meds.  CD4 count <35 on 12/20.  HIV 1 RNA 110,000.  Crypto antigen positive but low titer at 20 likely from previous infection.  LDH within normal.  Other infectious work-up pending. -Continue Biktarvy, Bactrim and Diflucan per ID.  Meds delivered to bedside prior to discharge. -ID to follow-up on the remainder of infectious work-up and arrange outpatient follow-up   RUQ pain/nausea/vomiting-likely from alcohol and cocaine use versus cholecystitis.  He tolerated soft diet prior to discharge. -Counseled on tobacco, alcohol and cocaine cessation. -Rx for Protonix 40 mg daily for possible gastritis -Rx for p.o. Zofran as needed -Consider outpatient follow-up with GI if no improvement with the above measures.   Alcohol abuse: reports drinking up to 2 of the 40 ounce beers every night.  No withdrawal symptoms. -Multivitamin, thiamine and folic acid   Tobacco use disorder- -Encouraged on tobacco cessation -Not interested in nicotine patch.   Cocaine abuse -Counseled.   Noncompliance -Counseled.   Chronic COVID-19 infection: Persistently positive for months now. -ID recommends airborne isolation precaution for the period of this hospitalization   Hyponatremia: Could be SIADH, beer potomania and iatrogenic from Bactrim.  TSH and a.m. cortisol within normal. Recent Labs  Lab 01/21/21 1526 01/22/21 0220 01/23/21 0253 01/24/21 0429  NA 127* 125* 126* 129*  -Recheck at follow-up in 1 to 2 weeks. -Encouraged to avoid alcohol.     Hypokalemia/hypomagnesemia: K3.3.  Hypomagnesemia resolved. -P.o. KCl 40x2 prior to discharge   Elevated AST: Likely due to alcohol.  CK within normal.  Resolved.   Pyuria/bacteriuria-UA concerning.  Patient reports UTI symptoms although chronic.   -Antibiotic discontinued by ID.   Increased nutrient needs Body mass index is 22.43 kg/m. Nutrition Problem: Moderate Malnutrition Etiology: chronic illness Signs/Symptoms: moderate  muscle depletion, severe fat depletion Interventions: Ensure Enlive (each supplement provides  350kcal and 20 grams of protein), MVI     Discharge Exam: Vitals:   01/23/21 1554 01/23/21 2056 01/24/21 0519 01/24/21 0835  BP: 133/88 119/75 121/76 137/88  Pulse: 73 65 80 64  Temp: 97.8 F (36.6 C) 97.8 F (36.6 C) 98.2 F (36.8 C) 98.6 F (37 C)  Resp: 18 15 18 18   Height:      Weight:      SpO2: 100% 98% 100% 100%  TempSrc:    Oral  BMI (Calculated):         GENERAL: Frail looking elderly male.  No apparent distress. HEENT: MMM.  Vision and hearing grossly intact.  NECK: Supple.  No apparent JVD.  RESP:  No IWOB.  Fair aeration bilaterally. CVS:  RRR. Heart sounds normal.  ABD/GI/GU: Bowel sounds present. Soft.  Some tenderness but distractible. MSK/EXT:  Moves extremities.  Significant muscle mass and subcu fat loss. SKIN: no apparent skin lesion or wound NEURO: Awake and alert.  Oriented appropriately.  No apparent focal neuro deficit. PSYCH: Calm. Normal affect.   Discharge Instructions  Discharge Instructions     Call MD for:  difficulty breathing, headache or visual disturbances   Complete by: As directed    Call MD for:  extreme fatigue   Complete by: As directed    Call MD for:  persistant dizziness or light-headedness   Complete by: As directed    Call MD for:  persistant nausea and vomiting   Complete by: As directed    Call MD for:  severe uncontrolled pain   Complete by: As directed    Diet general   Complete by: As directed    Soft diet over the next 2 to 3 days   Discharge instructions   Complete by: As directed    It has been a pleasure taking care of you!  You were hospitalized due to abdominal pain, nausea and vomiting.  We strongly recommend you quit using cocaine, drinking alcohol and smoking cigarettes.  We have also started you on acid reflux medication.  You may continue full liquid and/or soft diet for the next 2 to 3 days.  Please review your  new medication list and the directions on your medications before you take them.  Follow-up with your primary care doctor in 1 to 2 weeks or sooner if needed.  Follow-up with infectious disease as recommended to you.  It is important that you quit smoking cigarettes.  You may use nicotine patch to help you quit smoking.  Nicotine patch is available over-the-counter.  You may also discuss other options to help you quit smoking with your primary care doctor. You can also talk to professional counselors at 1-800-QUIT-NOW ([phone] 405-5888) for free smoking cessation counseling.     Take care,   Increase activity slowly   Complete by: As directed       Allergies as of 01/24/2021       Reactions   Bactrim [sulfamethoxazole-trimethoprim] Other (See Comments)   Patient was trialed on DS TIW and SS daily for PJP prophylaxis and developed hyperkalemia and increased Scr        Medication List     STOP taking these medications    verapamil 120 MG CR tablet Commonly known as: CALAN-SR       TAKE these medications    acetaminophen 500 MG tablet Commonly known as: TYLENOL Take 500 mg by mouth every 6 (six) hours as needed for moderate pain or headache.   Biktarvy 50-200-25 MG  Tabs tablet Generic drug: bictegravir-emtricitabine-tenofovir AF Take 1 tablet by mouth daily.   fluconazole 200 MG tablet Commonly known as: DIFLUCAN Take 2 tablets (400 mg total) by mouth daily.   fluticasone 50 MCG/ACT nasal spray Commonly known as: FLONASE Place 1 spray into both nostrils as needed for allergies or rhinitis.   folic acid 1 MG tablet Commonly known as: FOLVITE Take 1 tablet (1 mg total) by mouth daily. Start taking on: January 25, 2021   multivitamin with minerals Tabs tablet Take 1 tablet by mouth daily. Start taking on: January 25, 2021   nicotine 14 mg/24hr patch Commonly known as: NICODERM CQ - dosed in mg/24 hours Place 1 patch (14 mg total) onto the skin daily. Start  taking on: January 25, 2021   ondansetron 4 MG tablet Commonly known as: ZOFRAN Take 1 tablet (4 mg total) by mouth every 6 (six) hours as needed for nausea.   pantoprazole 40 MG tablet Commonly known as: Protonix Take 1 tablet (40 mg total) by mouth daily.   polyethylene glycol powder 17 GM/SCOOP powder Commonly known as: MiraLax Take 17 g by mouth 2 (two) times daily as needed for moderate constipation.   sulfamethoxazole-trimethoprim 400-80 MG tablet Commonly known as: BACTRIM Take 1 tablet by mouth daily.   thiamine 100 MG tablet Take 1 tablet (100 mg total) by mouth daily. Start taking on: January 25, 2021        Consultations: General surgery Infectious disease  Procedures/Studies:   DG Chest 2 View  Result Date: 01/21/2021 CLINICAL DATA:  Productive cough. EXAM: CHEST - 2 VIEW COMPARISON:  10/12/2020 FINDINGS: The heart size and mediastinal contours are within normal limits. Atherosclerotic calcification of the aorta is noted. There is redemonstration of scarring and patchy opacities in the left upper lobe and at the lung bases bilaterally, increased from the prior exam. No effusion or pneumothorax. No acute osseous abnormality. IMPRESSION: Interstitial and reticulonodular opacities in the left upper lobe and at the lung bases, increased from the prior exam and may be infectious or inflammatory. Electronically Signed   By: Brett Fairy M.D.   On: 01/21/2021 20:30   CT CHEST WO CONTRAST  Result Date: 01/22/2021 CLINICAL DATA:  Cough EXAM: CT CHEST WITHOUT CONTRAST TECHNIQUE: Multidetector CT imaging of the chest was performed following the standard protocol without IV contrast. COMPARISON:  CT chest 10/12/2020 FINDINGS: Cardiovascular: Heart size is normal. Small pericardial effusion. Coronary artery calcifications noted. Main pulmonary artery is normal caliber. Thoracic aorta is tortuous and normal caliber with mild calcified plaques. Mediastinum/Nodes: Numerous  enlarged mediastinal lymph nodes identified measuring up to 1.4 cm in short axis near the AP window and 2.9 cm in AP dimension subcarinal lymph node. Enlarged left hilar lymph nodes measuring up to approximately 1.7 cm in short axis, limited evaluation without contrast. No bulky axillary lymphadenopathy identified. Lungs/Pleura: Lungs are hyperinflated with prominent reticular interstitial densities bilaterally which are chronic. Stable chronic left apical pleural thickening. Redemonstration of an area of bronchiectasis and associated interstitial consolidative densities in the posterior aspect of the left upper lobe, grossly unchanged. Interval increasing bronchiectatic changes in the left lower lobe with associated linear pleural and parenchymal likely scarring densities. Bronchial wall thickening bilaterally more prominent in the lower lobes. Multiple chronic pulmonary nodular densities are again seen on the left including stable 12 mm nodule in the upper lobe image 35, 12 mm nodule in the upper lobe image 40 and increased size of an irregular nodule in the anterior left  lower lobe measuring 20 x 16 mm. A few patchy small ground-glass airspace densities are noted in the posterior right lung which appear to be new since previous study. Trace left pleural effusion. Upper Abdomen: No definite acute abnormality identified in the upper abdomen. Evidence of chronic pancreatitis with numerous calcifications. Colonic diverticulosis. Likely nephrolithiasis. Musculoskeletal: No suspicious bony lesions identified. IMPRESSION: 1. Mild interval progression of likely infectious/inflammatory densities mostly in the left lung including persistent chronic left lung nodules with the largest nodule increasing since previous study in the left lower lobe. Consider follow-up CT in 3 months. 2. Trace left pleural effusion.  Small pericardial effusion. 3. Bulky mediastinal and left hilar lymphadenopathy, similar to previous study. 4.  Evidence of chronic pancreatitis. 5. Other findings as described. Electronically Signed   By: Ofilia Neas M.D.   On: 01/22/2021 14:49   CT ABDOMEN PELVIS W CONTRAST  Result Date: 01/21/2021 CLINICAL DATA:  Abdominal pain for several weeks, initial encounter EXAM: CT ABDOMEN AND PELVIS WITH CONTRAST TECHNIQUE: Multidetector CT imaging of the abdomen and pelvis was performed using the standard protocol following bolus administration of intravenous contrast. CONTRAST:  137mL OMNIPAQUE IOHEXOL 300 MG/ML  SOLN COMPARISON:  10/12/2020 FINDINGS: Lower chest: Lung base demonstrates some chronic infiltrative changes in the medial lower lobes similar to that seen on the prior exam. Emphysematous changes and changes of bronchiectasis are seen. There is an enlarging nodule in the anterolateral aspect of the left lower lobe measuring 1.6 x 1.4 cm. This previously measured up to 12 mm in dimension. It also has a more full rounded appearance when compared with the prior exam. These changes are suspicious for underlying neoplasm. Hepatobiliary: Liver is within normal limits. Mild periportal edema is seen. Gallbladder is well distended with some minimal pericholecystic inflammatory change. Pancreas: Changes of chronic pancreatitis are noted with diffuse calcifications identified. Spleen: Normal in size without focal abnormality. Adrenals/Urinary Tract: Adrenal glands are within normal limits bilaterally. Kidneys demonstrate a normal enhancement pattern bilaterally. Normal excretion is noted on delayed images. The bladder is partially distended. Stomach/Bowel: Colon shows no obstructive or inflammatory changes. The appendix is well visualized with barium within. No inflammatory changes to suggest appendicitis are noted. Stomach is within normal limits. No obstructive changes in the small bowel are noted. There is herniation of at least a loop of small bowel into a right inguinal hernia. No definitive obstructive changes  are seen although this may be related to the patient's underlying symptomatology of pain. Vascular/Lymphatic: Atherosclerotic calcifications are noted. Multiple prominent lymph nodes are noted in the small bowel mesentery. Additionally a pre pancreatic node measuring up to 15 mm in short axis is seen. Peri aortic and intra-aortocaval lymph nodes measuring up to 2.5 cm in short axis are seen. Reproductive: Prostate is unremarkable. Other: No abdominal wall hernia or abnormality. No abdominopelvic ascites. Musculoskeletal: No acute or significant osseous findings. IMPRESSION: Right inguinal hernia containing a few loops of small bowel without definitive obstructive change. This may be related to the patient's underlying discomfort. Enlarging soft tissue nodule in the left lower lobe measuring up to 1.6 cm. Although prior chest CTs show some inflammatory changes this must be viewed with some suspicion for underlying neoplasm given its increase in size and bulk when compared with the prior study. Referral to multi disciplinary review board may be helpful to guide workup. Multiple mesenteric lymph nodes, pre pancreatic lymph node and multiple retroperitoneal enlarged lymph nodes are noted. Further workup of these nodes is recommended as well.  These may be related to patient's underlying HIV status. Suggestion of wall thickening and mild Peri cholecystic fluid. Right upper quadrant ultrasound may be helpful for further evaluation. Electronically Signed   By: Inez Catalina M.D.   On: 01/21/2021 22:41   US Abdomen Limited RUQ (LIVER/GB)  Result Date: 01/21/2021 CLINICAL DATA:  Right upper quadrant pain. EXAM: ULTRASOUND ABDOMEN LIMITED RIGHT UPPER QUADRANT COMPARISON:  01/21/2021. FINDINGS: Gallbladder: No cholelithiasis. There is mild gallbladder wall thickening with pericholecystic fluid. No sonographic Murphy sign noted by sonographer. Common bile duct: Diameter: 4.3 mm. Liver: No focal lesion identified. Within  normal limits in parenchymal echogenicity. Portal vein is patent on color Doppler imaging with normal direction of blood flow towards the liver. Other: Small amount of ascites adjacent to the gallbladder. IMPRESSION: 1. Mild gallbladder wall thickening and pericholecystic fluid. No sonographic Murphy sign. Findings may be related to ascites or local inflammatory changes. 2. Mild ascites at the gallbladder fossa. Electronically Signed   By: Brett Fairy M.D.   On: 01/21/2021 23:57       The results of significant diagnostics from this hospitalization (including imaging, microbiology, ancillary and laboratory) are listed below for reference.     Microbiology: Recent Results (from the past 240 hour(s))  Resp Panel by RT-PCR (Flu A&B, Covid) Nasopharyngeal Swab     Status: Abnormal   Collection Time: 01/21/21 10:56 PM   Specimen: Nasopharyngeal Swab; Nasopharyngeal(NP) swabs in vial transport medium  Result Value Ref Range Status   SARS Coronavirus 2 by RT PCR POSITIVE (A) NEGATIVE Final    Comment: (NOTE) SARS-CoV-2 target nucleic acids are DETECTED.  The SARS-CoV-2 RNA is generally detectable in upper respiratory specimens during the acute phase of infection. Positive results are indicative of the presence of the identified virus, but do not rule out bacterial infection or co-infection with other pathogens not detected by the test. Clinical correlation with patient history and other diagnostic information is necessary to determine patient infection status. The expected result is Negative.  Fact Sheet for Patients: EntrepreneurPulse.com.au  Fact Sheet for Healthcare Providers: IncredibleEmployment.be  This test is not yet approved or cleared by the [place] FDA and  has been authorized for detection and/or diagnosis of SARS-CoV-2 by FDA under an Emergency Use Authorization (EUA).  This EUA will remain in effect (meaning this test can be used)  for the duration of  the COVID-19 declaration under Section 564(b)(1) of the A ct, 21 U.S.C. section 360bbb-3(b)(1), unless the authorization is terminated or revoked sooner.     Influenza A by PCR NEGATIVE NEGATIVE Final   Influenza B by PCR NEGATIVE NEGATIVE Final    Comment: (NOTE) The Xpert Xpress SARS-CoV-2/FLU/RSV plus assay is intended as an aid in the diagnosis of influenza from Nasopharyngeal swab specimens and should not be used as a sole basis for treatment. Nasal washings and aspirates are unacceptable for Xpert Xpress SARS-CoV-2/FLU/RSV testing.  Fact Sheet for Patients: EntrepreneurPulse.com.au  Fact Sheet for Healthcare Providers: IncredibleEmployment.be  This test is not yet approved or cleared by the [place] FDA and has been authorized for detection and/or diagnosis of SARS-CoV-2 by FDA under an Emergency Use Authorization (EUA). This EUA will remain in effect (meaning this test can be used) for the duration of the COVID-19 declaration under Section 564(b)(1) of the Act, 21 U.S.C. section 360bbb-3(b)(1), unless the authorization is terminated or revoked.  Performed at [facility], [place] [address] [place], [place] [address]   Culture, fungus without smear  Status: None (Preliminary result)   Collection Time: 01/22/21 11:04 AM   Specimen: Urine, Random; Other  Result Value Ref Range Status   Specimen Description URINE, RANDOM  Final   Special Requests Immunocompromised  Final   Culture   Final    NO FUNGUS ISOLATED AFTER 2 DAYS Performed at [facility], [place] [address] [place],  [address]    Report Status PENDING  Incomplete     Labs:  CBC: Recent Labs  Lab 01/21/21 1526 01/22/21 0220 01/23/21 0253 01/24/21 0429  WBC 2.2* 3.6* 4.8 3.5*  NEUTROABS  --  3.2 4.2  --   HGB 11.3* 11.7* 10.9* 9.1*  HCT 35.0* 35.5* 32.8* 26.3*  MCV 98.6 95.4 95.9 92.6  PLT 216 145* 229 234    BMP &GFR Recent Labs  Lab 01/21/21 1526 01/22/21 0220 01/23/21 0253 01/24/21 0429  NA 127* 125* 126* 129*  K 4.3 5.4* 3.7 3.3*  CL 96* 96* 96* 101  CO2 23 22 23 24   GLUCOSE 157* 89 124* 152*  BUN 5* 6* 7* 6*  CREATININE 0.84 0.74 0.81 0.69  CALCIUM 8.3* 7.8* 8.2* 7.7*  MG  --   --  1.6* 1.9  PHOS  --   --   --  2.1*   Estimated Creatinine Clearance: 103.1 mL/min (by C-G formula based on SCr of 0.69 mg/dL). Liver & Pancreas: Recent Labs  Lab 01/21/21 1526 01/22/21 0220 01/23/21 0253 01/24/21 0429  AST 54* 79* 44* 37  ALT 12 11 12 10   ALKPHOS 235* 221* 202* 176*  BILITOT 0.9 1.6* 1.3* 0.9  PROT 7.3 6.7 7.1 5.9*  ALBUMIN 1.8* 1.7* 1.7* <1.5*   Recent Labs  Lab 01/21/21 1526 01/24/21 0429  LIPASE 33 22   No results for input(s): AMMONIA in the last 168 hours. Diabetic: No results for input(s): HGBA1C in the last 72 hours. No results for input(s): GLUCAP in the last 168 hours. Cardiac Enzymes: Recent Labs  Lab 01/24/21 0429  CKTOTAL 18*   No results for input(s): PROBNP in the last 8760 hours. Coagulation Profile: No results for input(s): INR, PROTIME in the last 168 hours. Thyroid Function Tests: Recent Labs    01/23/21 0253  TSH 0.549   Lipid Profile: No results for input(s): CHOL, HDL, LDLCALC, TRIG, CHOLHDL, LDLDIRECT in the last 72 hours. Anemia Panel: No results for input(s): VITAMINB12, FOLATE, FERRITIN, TIBC, IRON, RETICCTPCT in the last 72 hours. Urine analysis:    Component Value Date/Time   COLORURINE ORANGE (A) 01/21/2021 1521   APPEARANCEUR CLOUDY (A) 01/21/2021 1521   LABSPEC 1.020 01/21/2021 1521   PHURINE 6.0 01/21/2021 1521   GLUCOSEU NEGATIVE 01/21/2021 1521   HGBUR TRACE (A) 01/21/2021 1521   BILIRUBINUR SMALL (A) 01/21/2021 1521   KETONESUR NEGATIVE 01/21/2021 1521   PROTEINUR NEGATIVE 01/21/2021 1521   NITRITE POSITIVE (A) 01/21/2021 1521   LEUKOCYTESUR MODERATE (A) 01/21/2021 1521   Sepsis Labs: Invalid input(s):  PROCALCITONIN, LACTICIDVEN   Time coordinating discharge: 55 minutes  SIGNED:  Mercy Riding, MD  [facility] 01/24/2021, 4:29 PM

## 2021-01-24 NOTE — Progress Notes (Signed)
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   65 yo male hx pulm TB, aids, admission late 03/2020 for cryptococcal meningitis s/p 2 weeks induction ampho/flucytosine, transitioned to fluconazole, readmitted  10/12/20 off meds for at least 3 months (negative repeat csf analysis/culture), left ama readmitted with several days generalized abd pain in setting worsening acute on chronic dry cough and persistent covid testing positive   Recent Labs       Lab Results  Component Value Date    CD4TCELL 3 (L) 01/22/2021    CD4TABS <35 (L) 01/22/2021      Recent Labs       Lab Results  Component Value Date    HIV1RNAQUANT 25,300 03/27/2020      Off and on biktarvy. HIV genotype 03/2020 off meds x2 years no nrti mutation; sensitive to all integrase strand inhibitor       Previously briefly treated for pulm mac due to chronic cavitary changes seen (but in setting previous TB unclear-- had stopped mac treatment currently unclear if colonization or true infection). Will send afb fungal culture as has retroperitoneal LAD and nightsweat   Other OI in setting of generalized lymphadenopathy consideration include fungal/tb or lymphoma. HIV in itself can also cause chronic lymphadenopathy as well. Chest ct will be repeated to look for pjp or other OI pulmonary infection   Last 10/2020 repeat csf analysis bland. No crypto cns sx. Will repeat titer but defer csf testing for now. Will check urine fungal cx as crypto likes to involve the prostate as well. He'll need to  remain on fluconazole until cd4 >200 and on art and at least 1 year of treatment    With his abd pain -- this is clearly distractable. Could be cough related or covid infection or lymphadenitis. No clinical syndrome of cholecystitis or bladder infection.     Covid positive testing, he does have new abd pain as mentioned and also taste/smell disturbance. Persistent covid infection is certainly a known phenomenom in this population. No obvious beneficial dedicated covid tx needed or proven at this time    chronic hyponatremia and intermittent hyperkalemia  -- previous cosyntropin test negative and tsh normal. Doesn't appear cns crypto is actively having manifestation to account for the hyponatremia. Repeated labs sent. Stable   -------- 12/22 assessment Ldh normal Fungitel pending Cryptococcal titer stable at 20. No cns sx Hiv rna 111k  Urine fungal cx and histo/blasto serology in progress   Ct chest reviewed -- mentioned chronic pancreatitis, mediastinal lymphadenopathy, and slight progression of parenchymal disease. Certainly MAC/disseminated MAC considered (he does have fever but no dyspnea and is on room air). Many confounding factors for fever including likely prolonged covid. Very low concern for active pjp pulm infection at this time. Certainly if mycobacterial blood cx positive or  ongoing fever, will need to think about MAC more -- this can be done in id clinic   Somehow my previous blood mycobacterial culture was cancelled. I have reordered today as lab sent out  He had remain stable well. I do not see any immediate need for further id workup, but to get him on his ART and continue cryptococcal treatment, and reevaluate in clinic  I have provided him the number to [place] [phone]) our case manager to assist with transport/getting to id clinic He should have an appointment with me as well on 1/10 @ 10 am.   Plan: Will f/u pending labs in id clinic Please make sure he has  mycobacterial blood cx sent Continue biktarvy, fluconazole 400 mg planned for 4-8 weeks then transition to 200 mg daily, and ss bactrim daily pjp prophy He should have these meds on hands Ok to discharge from id standpoint Follow up and contact info for our id clinic given to patient, also available here on this note Discussed with primary team  Principal Problem:   Acute cholecystitis Active Problems:   Cryptococcal meningitis ([place])   Leukocytopenia   Moderate protein malnutrition ([facility])   Hypoalbuminemia   HIV infection ([place])   Complicated UTI (urinary tract infection)   Noncompliance   Allergies  Allergen Reactions   Bactrim [Sulfamethoxazole-Trimethoprim] Other (See Comments)    Patient was trialed on DS TIW and SS daily for PJP prophylaxis and developed hyperkalemia and increased Scr    Scheduled Meds:  bictegravir-emtricitabine-tenofovir AF  1 tablet Oral Daily   enoxaparin (LOVENOX) injection  40 mg Subcutaneous Q24H   feeding supplement  237 mL Oral BID BM   fluconazole  400 mg Oral Daily   folic acid  1 mg Oral Daily   multivitamin with minerals  1 tablet Oral Daily   nicotine  14 mg Transdermal Daily   sulfamethoxazole-trimethoprim  1 tablet Oral Daily   thiamine  100 mg Oral Daily   Or   thiamine  100 mg Intravenous Daily   Continuous Infusions: PRN Meds:.acetaminophen **OR** acetaminophen, LORazepam **OR** LORazepam, ondansetron **OR** ondansetron (ZOFRAN) IV, senna-docusate   SUBJECTIVE: Eating well No f/c  Review of Systems: ROS All other ROS was negative, except mentioned above     OBJECTIVE: Vitals:   01/23/21 1554 01/23/21 2056 01/24/21 0519 01/24/21 0835  BP: 133/88 119/75 121/76 137/88  Pulse: 73 65 80 64  Resp: _0 Temp: 97.8 F (36.6 C) 97.8 F (36.6 C) 98.2 F (36.8 C) 98.6 F (37 C)  TempSrc:    Oral  SpO2: 100% 98% 100% 100%  Weight:      Height:       Body mass index is 22.43 kg/m.  Physical  Exam General/constitutional: no distress, pleasant HEENT: Normocephalic, PER, Conj Clear, EOMI, Oropharynx clear Neck supple CV: rrr no mrg Lungs: clear to auscultation, normal respiratory effort Abd: Soft, Nontender Ext: no edema Skin: No Rash Neuro: nonfocal MSK: no peripheral joint swelling/tenderness/warmth  Lab Results Lab Results  Component Value Date   WBC 3.5 (L) 01/24/2021   HGB 9.1 (L) 01/24/2021   HCT 26.3 (L) 01/24/2021   MCV 92.6 01/24/2021   PLT 234 01/24/2021    Lab Results  Component Value Date   CREATININE 0.69 01/24/2021   BUN 6 (L) 01/24/2021   NA 129 (L) 01/24/2021   K 3.3 (L) 01/24/2021   CL 101 01/24/2021   CO2 24 01/24/2021    Lab Results  Component  Value Date   ALT 10 01/24/2021   AST 37 01/24/2021   ALKPHOS 176 (H) 01/24/2021   BILITOT 0.9 01/24/2021      Microbiology: Recent Results (from the past 240 hour(s))  Resp Panel by RT-PCR (Flu A&B, Covid) Nasopharyngeal Swab     Status: Abnormal   Collection Time: 01/21/21 10:56 PM   Specimen: Nasopharyngeal Swab; Nasopharyngeal(NP) swabs in vial transport medium  Result Value Ref Range Status   SARS Coronavirus 2 by RT PCR POSITIVE (A) NEGATIVE Final    Comment: (NOTE) SARS-CoV-2 target nucleic acids are DETECTED.  The SARS-CoV-2 RNA is generally detectable in upper respiratory specimens during the acute phase of infection. Positive results are indicative of the presence of the identified virus, but do not rule out bacterial infection or co-infection with other pathogens not detected by the test. Clinical correlation with patient history and other diagnostic information is necessary to determine patient infection status. The expected result is Negative.  Fact Sheet for Patients: EntrepreneurPulse.com.au  Fact Sheet for Healthcare Providers: IncredibleEmployment.be  This test is not yet approved or cleared by the [place] FDA and  has been  authorized for detection and/or diagnosis of SARS-CoV-2 by FDA under an Emergency Use Authorization (EUA).  This EUA will remain in effect (meaning this test can be used) for the duration of  the COVID-19 declaration under Section 564(b)(1) of the A ct, 21 U.S.C. section 360bbb-3(b)(1), unless the authorization is terminated or revoked sooner.     Influenza A by PCR NEGATIVE NEGATIVE Final   Influenza B by PCR NEGATIVE NEGATIVE Final    Comment: (NOTE) The Xpert Xpress SARS-CoV-2/FLU/RSV plus assay is intended as an aid in the diagnosis of influenza from Nasopharyngeal swab specimens and should not be used as a sole basis for treatment. Nasal washings and aspirates are unacceptable for Xpert Xpress SARS-CoV-2/FLU/RSV testing.  Fact Sheet for Patients: EntrepreneurPulse.com.au  Fact Sheet for Healthcare Providers: IncredibleEmployment.be  This test is not yet approved or cleared by the [place] FDA and has been authorized for detection and/or diagnosis of SARS-CoV-2 by FDA under an Emergency Use Authorization (EUA). This EUA will remain in effect (meaning this test can be used) for the duration of the COVID-19 declaration under Section 564(b)(1) of the Act, 21 U.S.C. section 360bbb-3(b)(1), unless the authorization is terminated or revoked.  Performed at [facility], [place] [address], [place], [place] [address]   Culture, fungus without smear     Status: None (Preliminary result)   Collection Time: 01/22/21 11:04 AM   Specimen: Urine, Random; Other  Result Value Ref Range Status   Specimen Description URINE, RANDOM  Final   Special Requests Immunocompromised  Final   Culture   Final    NO FUNGUS ISOLATED AFTER 1 DAY Performed at [facility], [place] [address] [place], [place] [address]    Report Status PENDING  Incomplete     Serology:   Imaging: If present, new imagings (plain films, ct scans, and mri)  have been personally visualized and interpreted; radiology reports have been reviewed. Decision making incorporated into the Impression / Recommendations.  12/20 chest ct 1. Mild interval progression of likely infectious/inflammatory densities mostly in the left lung including persistent chronic left lung nodules with the largest nodule increasing since previous study in the left lower lobe. Consider follow-up CT in 3 months. 2. Trace left pleural effusion.  Small pericardial effusion. 3. Bulky mediastinal and left hilar lymphadenopathy, similar to previous study. 4. Evidence  of chronic pancreatitis. 5. Other findings as described.   Jabier Mutton, [place] [place] [phone] pager    01/24/2021, 1:31 PM

## 2021-01-24 NOTE — Progress Notes (Signed)
Patient for discharge. PER ID , patients needs AFB culture to be done. Called lab and lab tech to draw specimen, they said they do not know about that test. MD made aware.

## 2021-01-24 NOTE — Progress Notes (Signed)
[name] to be D/C'd  per MD order.  Discussed with the patient and all questions fully answered.  VSS, Skin clean, dry and intact without evidence of skin break down, no evidence of skin tears noted.  IV catheter discontinued intact. Site without signs and symptoms of complications. Dressing and pressure applied.  An After Visit Summary was printed and given to the patient. Patient received prescription.  D/c education completed with patient/family including follow up instructions, medication list, d/c activities limitations if indicated, with other d/c instructions as indicated by MD - patient able to verbalize understanding, all questions fully answered.   Patient instructed to return to ED, call 911, or call MD for any changes in condition.   Patient to be escorted via [place], and D/C home via private auto.

## 2021-01-27 LAB — FUNGITELL, SERUM

## 2021-01-30 LAB — FUNGITELL, SERUM: Fungitell Result: <31 pg/mL (ref <80)

## 2021-01-31 LAB — BLASTOMYCES ANTIGEN: Blastomyces Antigen: NOT DETECTED ng/mL

## 2021-02-12 ENCOUNTER — Telehealth: Payer: Self-pay

## 2021-02-12 ENCOUNTER — Ambulatory Visit: Payer: Medicaid Other | Admitting: Internal Medicine

## 2021-02-12 ENCOUNTER — Ambulatory Visit: Payer: Medicaid Other | Admitting: Pharmacist

## 2021-02-12 ENCOUNTER — Ambulatory Visit: Payer: Medicaid Other

## 2021-02-12 LAB — CULTURE, FUNGUS WITHOUT SMEAR

## 2021-02-12 NOTE — Telephone Encounter (Signed)
Attempted to call patient regarding new pt appt for today. Not able to reach him at this moment. Voicemail not set up. Will need to reschedule. Leatrice Jewels, RMA

## 2021-02-14 ENCOUNTER — Telehealth: Payer: Self-pay

## 2021-02-14 NOTE — Telephone Encounter (Signed)
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  Thank you (i'd try Mitch first)

## 2021-02-14 NOTE — Telephone Encounter (Signed)
Referral placed with Mitch, [facility].   Beryle Flock, RN

## 2021-02-15 ENCOUNTER — Emergency Department (HOSPITAL_COMMUNITY)
Admission: EM | Admit: 2021-02-15 | Discharge: 2021-02-16 | Disposition: A | Discharge status: Home / Self Care | Payer: Medicaid Other | Attending: Emergency Medicine | Admitting: Emergency Medicine

## 2021-02-15 ENCOUNTER — Other Ambulatory Visit: Payer: Self-pay

## 2021-02-15 DIAGNOSIS — R300 Dysuria: Secondary | ICD-10-CM | POA: Diagnosis not present

## 2021-02-15 DIAGNOSIS — R059 Cough, unspecified: Secondary | ICD-10-CM | POA: Insufficient documentation

## 2021-02-15 DIAGNOSIS — E871 Hypo-osmolality and hyponatremia: Secondary | ICD-10-CM | POA: Diagnosis not present

## 2021-02-15 DIAGNOSIS — Z20822 Contact with and (suspected) exposure to covid-19: Secondary | ICD-10-CM | POA: Diagnosis not present

## 2021-02-15 DIAGNOSIS — I471 Supraventricular tachycardia: Secondary | ICD-10-CM

## 2021-02-15 DIAGNOSIS — R0602 Shortness of breath: Secondary | ICD-10-CM | POA: Insufficient documentation

## 2021-02-15 DIAGNOSIS — R1013 Epigastric pain: Secondary | ICD-10-CM

## 2021-02-15 LAB — COMPREHENSIVE METABOLIC PANEL
ALT: 32 U/L (ref 0–44)
AST: 112 U/L — ABNORMAL HIGH (ref 15–41)
Albumin: 1.6 g/dL — ABNORMAL LOW (ref 3.5–5.0)
Alkaline Phosphatase: 518 U/L — ABNORMAL HIGH (ref 38–126)
Anion gap: 8 (ref 5–15)
BUN: 7 mg/dL — ABNORMAL LOW (ref 8–23)
CO2: 24 mmol/L (ref 22–32)
Calcium: 8.4 mg/dL — ABNORMAL LOW (ref 8.9–10.3)
Chloride: 95 mmol/L — ABNORMAL LOW (ref 98–111)
Creatinine, Ser: 0.84 mg/dL (ref 0.61–1.24)
GFR, Estimated: >60 mL/min (ref >60)
Glucose, Bld: 149 mg/dL — ABNORMAL HIGH (ref 70–99)
Potassium: 4.9 mmol/L (ref 3.5–5.1)
Sodium: 127 mmol/L — ABNORMAL LOW (ref 135–145)
Total Bilirubin: 1.2 mg/dL (ref 0.3–1.2)
Total Protein: 7.6 g/dL (ref 6.5–8.1)

## 2021-02-15 LAB — URINALYSIS, MICROSCOPIC (REFLEX): WBC, UA: >50 WBC/hpf (ref 0–5)

## 2021-02-15 LAB — CBC
HCT: 32.9 % — ABNORMAL LOW (ref 39.0–52.0)
Hemoglobin: 11.2 g/dL — ABNORMAL LOW (ref 13.0–17.0)
MCH: 32.6 pg (ref 26.0–34.0)
MCHC: 34 g/dL (ref 30.0–36.0)
MCV: 95.6 fL (ref 80.0–100.0)
Platelets: 208 10*3/uL (ref 150–400)
RBC: 3.44 MIL/uL — ABNORMAL LOW (ref 4.22–5.81)
RDW: 15.5 % (ref 11.5–15.5)
WBC: 2.6 10*3/uL — ABNORMAL LOW (ref 4.0–10.5)
nRBC: 0.8 % — ABNORMAL HIGH (ref 0.0–0.2)

## 2021-02-15 LAB — URINALYSIS, ROUTINE W REFLEX MICROSCOPIC
Glucose, UA: NEGATIVE mg/dL
Ketones, ur: NEGATIVE mg/dL
Nitrite: POSITIVE — AB
Protein, ur: NEGATIVE mg/dL
Specific Gravity, Urine: 1.025 (ref 1.005–1.030)
pH: 6 (ref 5.0–8.0)

## 2021-02-15 LAB — LIPASE, BLOOD: Lipase: 35 U/L (ref 11–51)

## 2021-02-15 NOTE — ED Notes (Signed)
Pt family called to give updated info on pts medical history, info was given to triage RN

## 2021-02-15 NOTE — ED Provider Triage Note (Signed)
Emergency Medicine Provider Triage Evaluation Note  Ricky [name] , a 66 y.o. male  was evaluated in triage.  Pt complains of abd pain for 1 month hx of ulcers and states it feels similar. Stool nml.  No nv or diarrhea currently.   Review of Systems  Positive: Abd pain Negative: Fever   Physical Exam  BP 126/86 (BP Location: Left Arm)    Pulse 97    Temp 99.5 F (37.5 C) (Oral)    Resp 18    SpO2 100%  Gen:   Awake, no distress  Resp:  Normal effort  MSK:   Moves extremities without difficulty  Other:  Abd soft. LLQ and RLQ TTP.   Medical Decision Making  Medically screening exam initiated at 5:40 PM.  Appropriate orders placed.  Ricky [name] was informed that the remainder of the evaluation will be completed by another provider, this initial triage assessment does not replace that evaluation, and the importance of remaining in the ED until their evaluation is complete.  Pt with month of abd pain Labs ordered. TTP diffusely in lower abd. Will obtain CT.    Ricky [name], [place] 02/15/21 1742

## 2021-02-15 NOTE — ED Triage Notes (Signed)
Pt here for abd pain x1 month w/ increased pain in R inguinal hernia. Pt reports he has been seen for it but they didn't do anything, he believes he has an ulcer. Denies N/V/D. Pt also has productive cough x1 mo, coughing up white mucus.

## 2021-02-16 ENCOUNTER — Emergency Department (HOSPITAL_COMMUNITY): Payer: Medicaid Other

## 2021-02-16 LAB — RESP PANEL BY RT-PCR (FLU A&B, COVID) ARPGX2
Influenza A by PCR: NEGATIVE
Influenza B by PCR: NEGATIVE
SARS Coronavirus 2 by RT PCR: POSITIVE — AB

## 2021-02-16 IMAGING — DX DG CHEST 1V PORT
1 series · 1 of 1 positions shown · non-contrast
Comparison: [DATE]

CLINICAL DATA: Abdominal pain x1 month.

EXAM:
PORTABLE CHEST 1 VIEW

[chest ap]
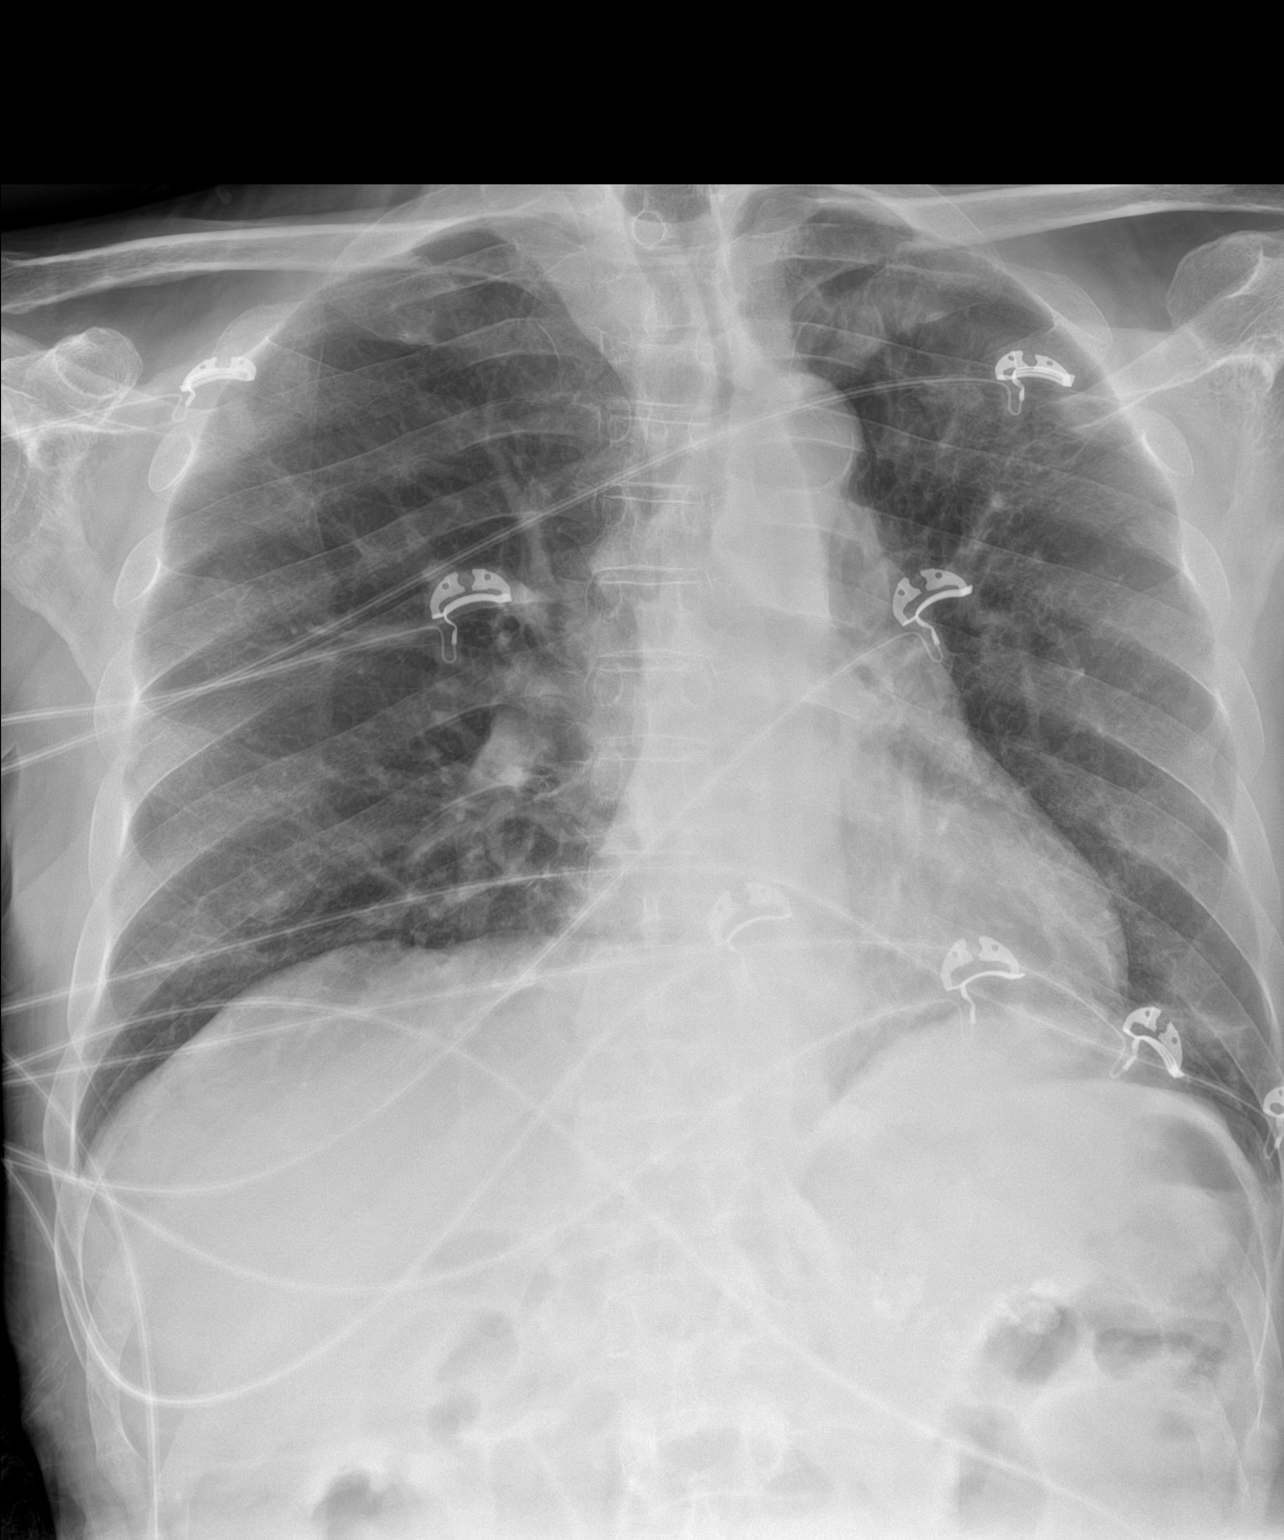

[1 of 1 positions shown; findings below may reference images not displayed]

FINDINGS: The heart size and mediastinal contours are within normal limits.
Chronic appearing increased lung markings are noted. Very mild
atelectasis is noted within the lateral aspect of the left lung
base. There is no evidence of acute infiltrate, pleural effusion or
pneumothorax. The visualized skeletal structures are unremarkable.
IMPRESSION: Very mild left basilar atelectasis.

## 2021-02-16 IMAGING — CT CT ABD-PELV W/ CM
2 of 5 series · 15 of 46 positions shown, 17 images · IV contrast (APPLIED)
Comparison: [DATE]

CLINICAL DATA: Left lower quadrant pain

EXAM:
CT ABDOMEN AND PELVIS WITH CONTRAST
TECHNIQUE: Multidetector CT imaging of the abdomen and pelvis was performed
using the standard protocol following bolus administration of
intravenous contrast.

[Series 3: abdomen 5.0 · axial · 0.76mm/px · z∈[-440,-50]mm · 12 of 91 slices shown, 14 images]
[im 7/91  soft-tissue]
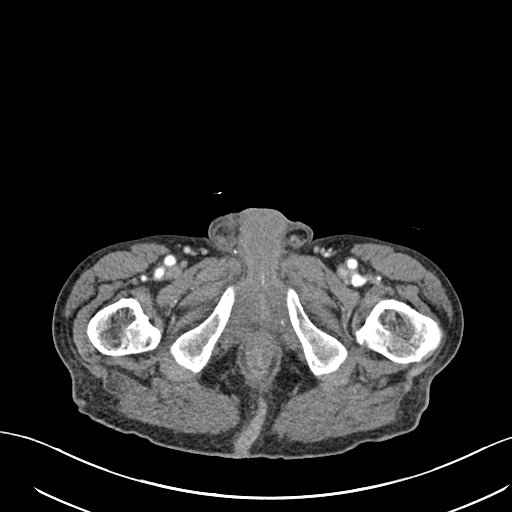
[im 7/91  bone]
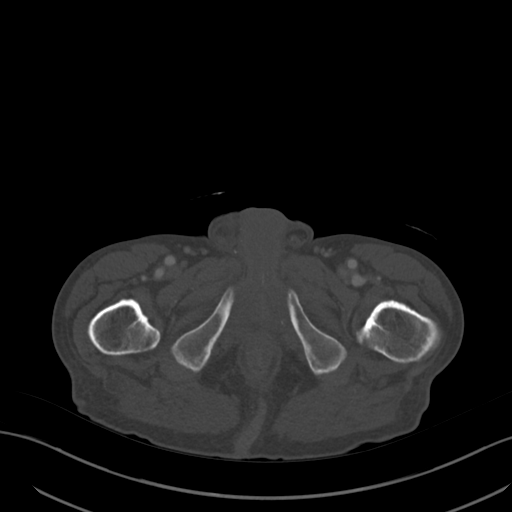
[im 13/91  soft-tissue]
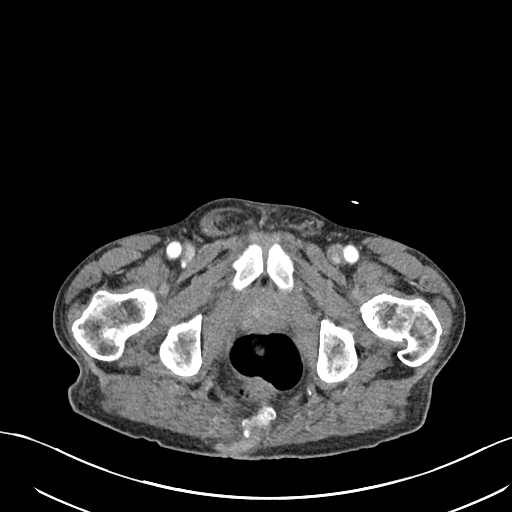
[im 19/91  soft-tissue]
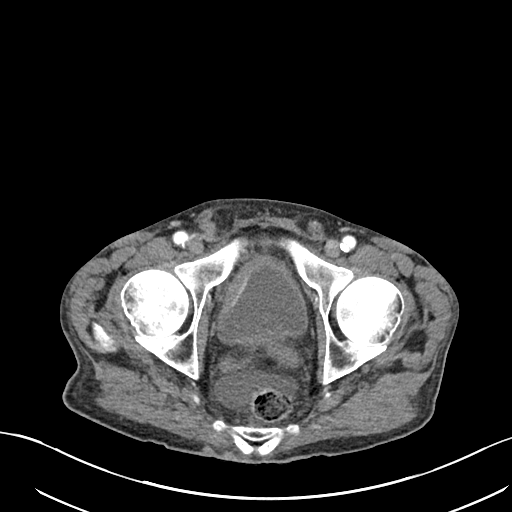
[im 31/91  soft-tissue]
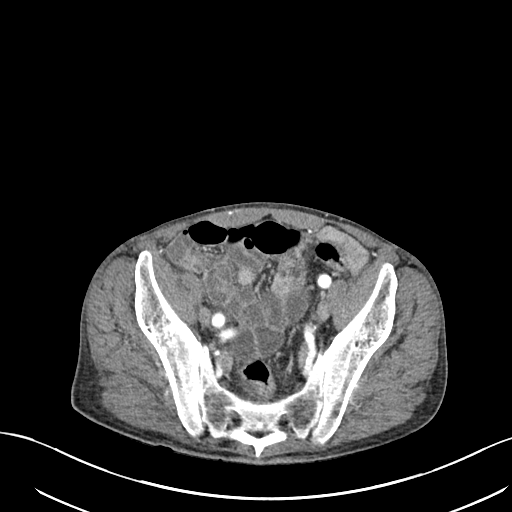
[im 37/91  soft-tissue]
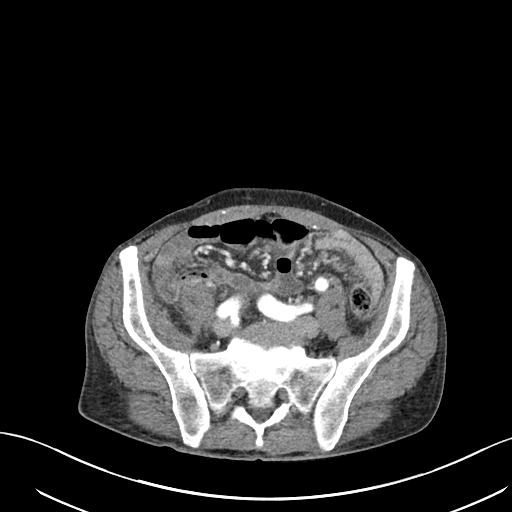
[im 43/91  soft-tissue]
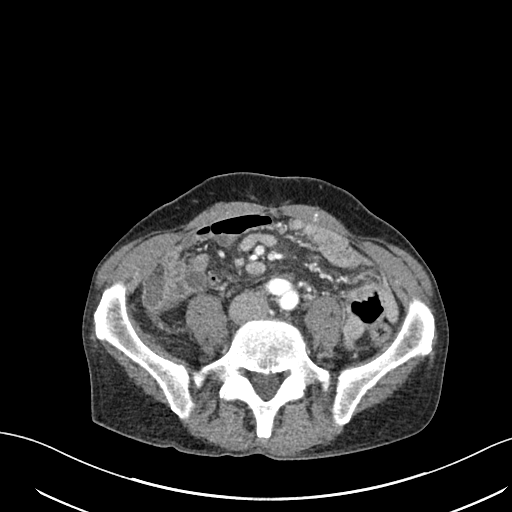
[im 49/91  soft-tissue]
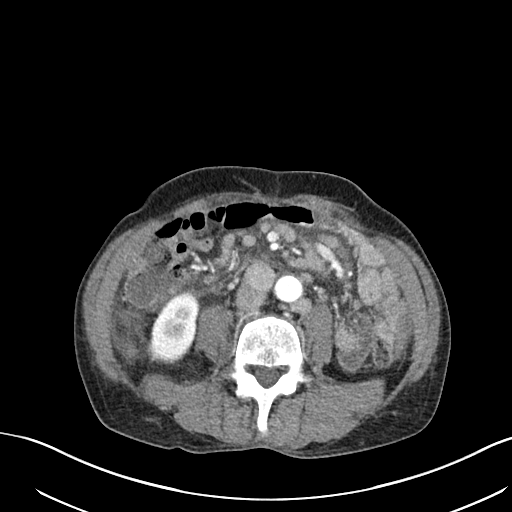
[im 55/91  soft-tissue]
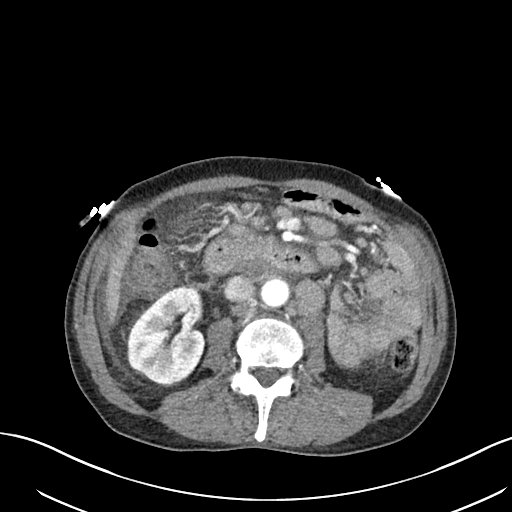
[im 61/91  soft-tissue]
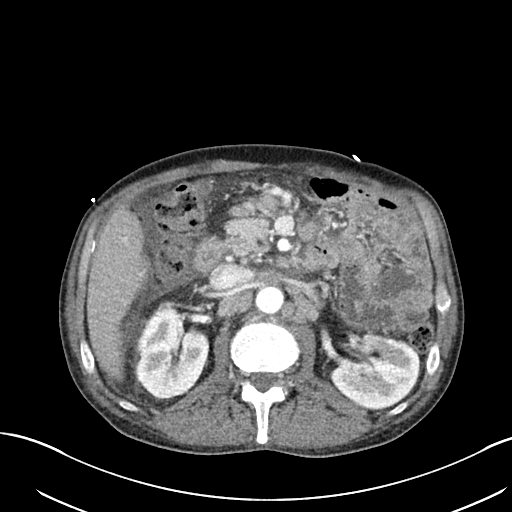
[im 61/91  bone]
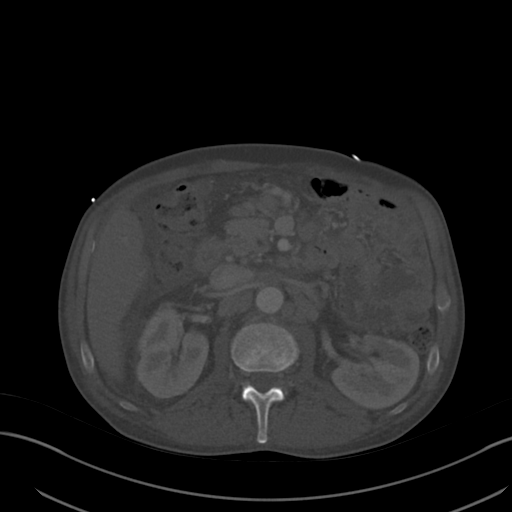
[im 73/91  soft-tissue]
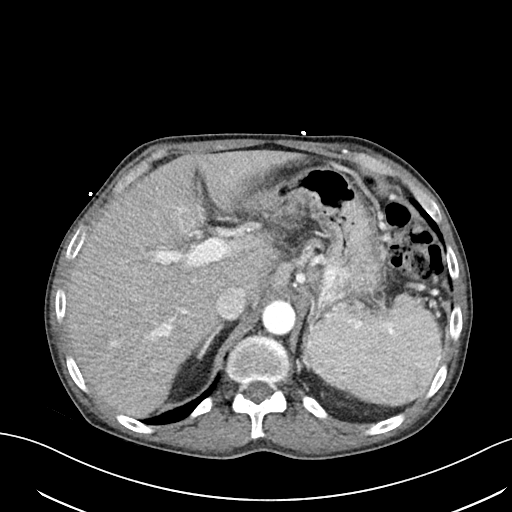
[im 79/91  soft-tissue]
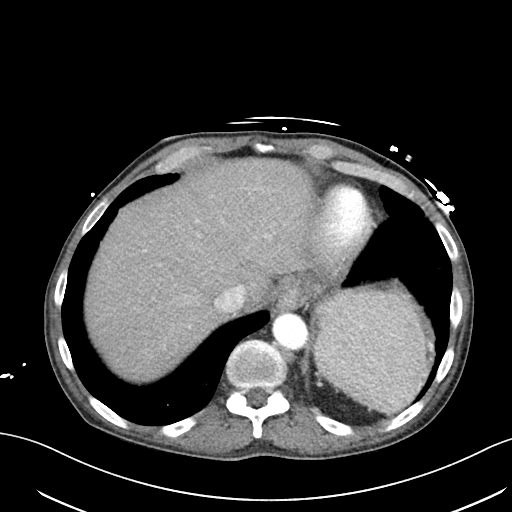
[im 85/91  soft-tissue]
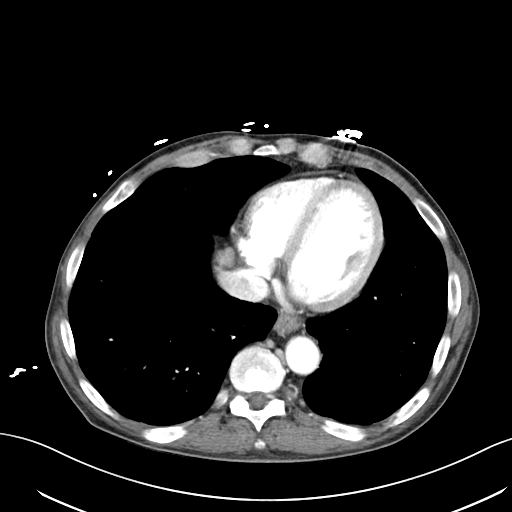

[Series 6: abdomen 3.0 mpr cor · coronal · 0.66mm/px · 3 of 90 slices shown]
[im 30/90  soft-tissue]
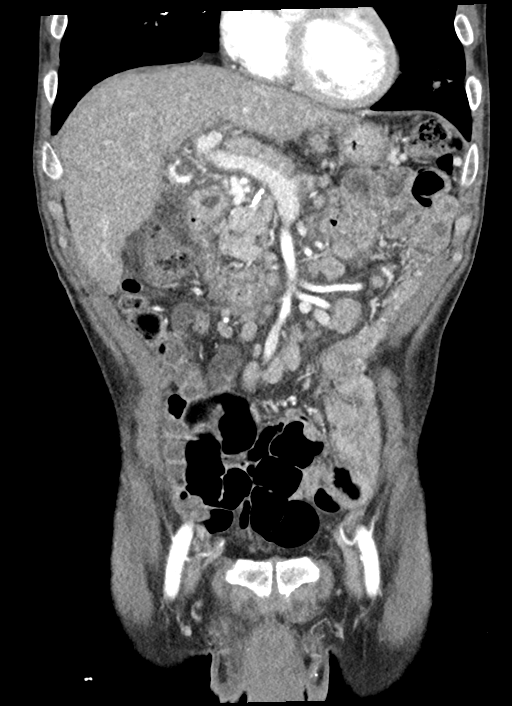
[im 40/90  soft-tissue]
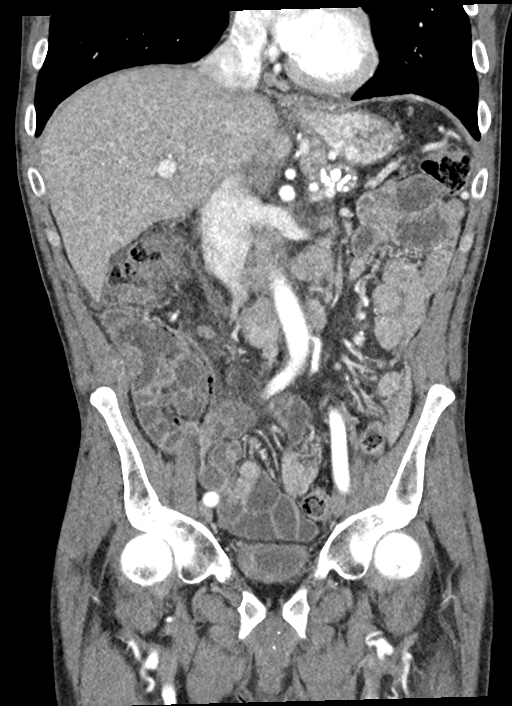
[im 50/90  soft-tissue]
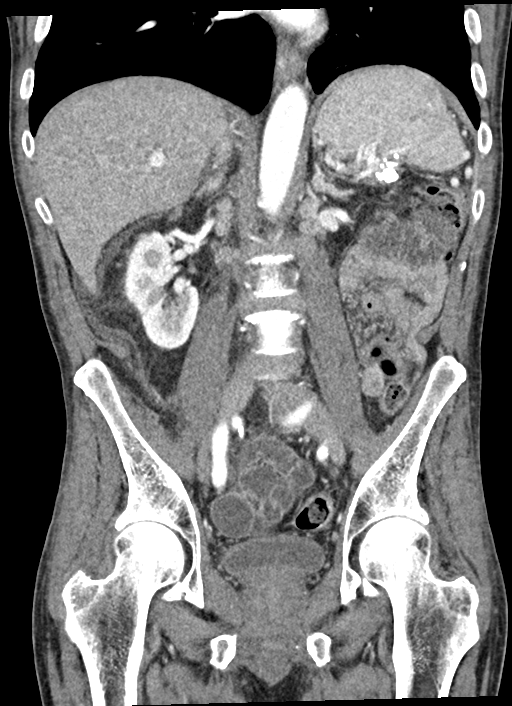

[15 of 46 positions shown; findings below may reference images not displayed]

RADIATION DOSE REDUCTION: This exam was performed according to the
departmental dose-optimization program which includes automated
exposure control, adjustment of the mA and/or kV according to
patient size and/or use of iterative reconstruction technique.

CONTRAST:  100 cc Omnipaque 350 intravenous
FINDINGS: Lower chest: Cluster of nodules in the left lower lobe with the
larger measuring 17 mm. Recent recommendation for multidisciplinary
tumor board referral.

Hepatobiliary: No acute findingno evidence of biliary obstruction or
stone.

Pancreas: Chronic calcific pancreatitis. No evidence of acute
inflammation. Associated intermittent ductal dilatation especially
tail.

Spleen: Stable

Adrenals/Urinary Tract: Negative adrenals. Scarring at the lower
pole left kidney. Eccentric right bladder wall thickening and
mucosal hyper density. No discrete measurable mass.

Stomach/Bowel: No obstruction. No visible bowel inflammation
including acute appendicitis.

Vascular/Lymphatic: No acute vascular abnormality. Continued lower
mediastinal, retroperitoneal, and mesenteric lymphadenopathy. The
level of the lower pole left kidney left periaortic node measures 3
cm in thickness, stable from prior when remeasured in a similar
fashion. When overlapping coverage with chest CT, lymph nodes have
appeared since [DATE] chest CT and enlarged since the
[DATE] chest CT.

Reproductive:No pathologic findings.

Other: Small volume ascites in the pelvis which is low-density.
Right inguinal hernia which contains fat and ascitic fluid

Musculoskeletal: No acute abnormalities.
IMPRESSION: 1. No acute finding.  Stable from [DATE].
2. Abdominal and lower thoracic adenopathy which is new/progressive
from scans in [REDACTED] in [DATE]. Atypical infection or
lymphoma are primary concerns.
3. Eccentric right bladder wall thickening and mucosal
hyperenhancement, need urology assessment.
4. Left lower lobe nodules recently assessed by chest CT.
5. Chronic findings are described above.

## 2021-02-16 MED ORDER — FENTANYL CITRATE PF 50 MCG/ML IJ SOSY
100.0000 ug | PREFILLED_SYRINGE | Freq: Once | INTRAMUSCULAR | Status: AC
  Administered 2021-02-16: 100 ug via INTRAVENOUS
  Filled 2021-02-16: qty 2

## 2021-02-16 MED ORDER — METOPROLOL TARTRATE 25 MG PO TABS
25.0000 mg | ORAL_TABLET | Freq: Once | ORAL | Status: AC
  Administered 2021-02-16: 25 mg via ORAL
  Filled 2021-02-16: qty 1

## 2021-02-16 MED ORDER — PANTOPRAZOLE SODIUM 40 MG IV SOLR
40.0000 mg | Freq: Once | INTRAVENOUS | Status: AC
  Administered 2021-02-16: 40 mg via INTRAVENOUS
  Filled 2021-02-16: qty 40

## 2021-02-16 MED ORDER — IOHEXOL 350 MG/ML SOLN
100.0000 mL | Freq: Once | INTRAVENOUS | Status: AC | PRN
  Administered 2021-02-16: 100 mL via INTRAVENOUS

## 2021-02-16 MED ORDER — SODIUM CHLORIDE 0.9 % IV BOLUS (SEPSIS)
1000.0000 mL | Freq: Once | INTRAVENOUS | Status: AC
  Administered 2021-02-16: 1000 mL via INTRAVENOUS

## 2021-02-16 MED ORDER — ADENOSINE 6 MG/2ML IV SOLN: 6.0000 mg | Freq: Once | INTRAVENOUS | Status: DC

## 2021-02-16 MED ORDER — PANTOPRAZOLE SODIUM 40 MG PO TBEC: 40.0000 mg | DELAYED_RELEASE_TABLET | Freq: Every day | ORAL | 0 refills | Status: DC

## 2021-02-16 MED ORDER — HYDROCODONE-ACETAMINOPHEN 5-325 MG PO TABS
1.0000 | ORAL_TABLET | Freq: Once | ORAL | Status: AC
Start: 2021-02-16 — End: 2021-02-16
  Administered 2021-02-16: 1 via ORAL
  Filled 2021-02-16: qty 1

## 2021-02-16 NOTE — ED Provider Notes (Signed)
No other further episodes of SVT Patient resting comfortably.  He was given one-time dose of Lopressor Stressed the importance of close follow-up with gastroenterology for his continued abdominal pain, as well as cardiology for his SVT, as well as infectious disease to manage his HIV/AIDS Patient is awake and alert in no acute distress at this time. Patient reports he does have means of transportation to get to  his appointments   Ripley Fraise, MD 02/16/21 [phone]

## 2021-02-16 NOTE — ED Provider Notes (Signed)
[facility] EMERGENCY DEPARTMENT Provider Note   CSN: [id number] Arrival date & time: 02/15/21  1547     History  Chief Complaint  Patient presents with   Abdominal Pain    Ricky [name] is a 66 y.o. male.  The history is provided by the patient.  Abdominal Pain Pain location:  Generalized Pain severity:  Moderate Onset quality:  Gradual Timing:  Constant Progression:  Worsening Chronicity:  Recurrent Relieved by:  Nothing Worsened by:  Movement and palpation Associated symptoms: cough, dysuria and shortness of breath   Associated symptoms: no fever and no vomiting   Patient with history of HIV/AIDS presents with abdominal pain.  He reports abdominal pain for over a month.  He reports nausea.  He thinks this may be an ulcer, but denies any bloody stool.    Home Medications Prior to Admission medications   Medication Sig Start Date End Date Taking? Authorizing Provider  acetaminophen (TYLENOL) 500 MG tablet Take 500 mg by mouth every 6 (six) hours as needed for moderate pain or headache.    [provider]  bictegravir-emtricitabine-tenofovir AF (BIKTARVY) 50-200-25 MG TABS tablet Take 1 tablet by mouth daily. 01/23/21   Vu, Johnny Bridge T, MD  fluconazole (DIFLUCAN) 200 MG tablet Take 2 tablets (400 mg total) by mouth daily. 01/22/21   Vu, Rockey Situ, MD  fluticasone (FLONASE) 50 MCG/ACT nasal spray Place 1 spray into both nostrils as needed for allergies or rhinitis. Patient not taking: Reported on 01/23/2021 10/26/20 12/25/20  Donney Dice, DO  folic acid (FOLVITE) 1 MG tablet Take 1 tablet (1 mg total) by mouth daily. 01/25/21   Mercy Riding, MD  Multiple Vitamin (MULTIVITAMIN WITH MINERALS) TABS tablet Take 1 tablet by mouth daily. 01/25/21   Mercy Riding, MD  nicotine (NICODERM CQ - DOSED IN MG/24 HOURS) 14 mg/24hr patch Place 1 patch (14 mg total) onto the skin daily. 01/25/21   Mercy Riding, MD  ondansetron (ZOFRAN) 4 MG tablet Take 1 tablet (4 mg  total) by mouth every 6 (six) hours as needed for nausea. 01/24/21   Mercy Riding, MD  pantoprazole (PROTONIX) 40 MG tablet Take 1 tablet (40 mg total) by mouth daily. 01/24/21 03/25/21  Mercy Riding, MD  polyethylene glycol powder (MIRALAX) 17 GM/SCOOP powder Take 17 g by mouth 2 (two) times daily as needed for moderate constipation. 01/24/21   Mercy Riding, MD  sulfamethoxazole-trimethoprim (BACTRIM) 400-80 MG tablet Take 1 tablet by mouth daily. 01/23/21   Vu, Johnny Bridge T, MD  thiamine 100 MG tablet Take 1 tablet (100 mg total) by mouth daily. 01/25/21 07/24/21  Mercy Riding, MD      Allergies    Bactrim [sulfamethoxazole-trimethoprim]    Review of Systems   Review of Systems  Constitutional:  Negative for fever.  Respiratory:  Positive for cough and shortness of breath.   Gastrointestinal:  Positive for abdominal pain. Negative for blood in stool and vomiting.  Genitourinary:  Positive for dysuria.  All other systems reviewed and are negative.  Physical Exam Updated Vital Signs BP 131/79    Pulse 85    Temp 99.3 F (37.4 C) (Oral)    Resp 16    SpO2 96%  Physical Exam CONSTITUTIONAL: Chronically ill-appearing, no acute distress HEAD: Normocephalic/atraumatic EYES: EOMI/PERRL ENMT: Mucous membranes moist, poor dentition NECK: supple no meningeal signs SPINE/BACK:entire spine nontender CV: S1/S2 noted, no murmurs/rubs/gallops noted LUNGS: Lungs are clear to auscultation bilaterally, no apparent distress  ABDOMEN: soft, diffuse moderate tenderness, no rebound or guarding, bowel sounds noted throughout abdomen GU:no cva tenderness, right inguinal hernia is noted, no overlying erythema or significant tenderness.  No scrotal edema or erythema, chaperone present NEURO: Pt is awake/alert/appropriate, moves all extremitiesx4.  No facial droop.   EXTREMITIES: pulses normal/equal, full ROM SKIN: warm, color normal PSYCH: Mildly anxious  ED Results / Procedures / Treatments   Labs (all  labs ordered are listed, but only abnormal results are displayed) Labs Reviewed  RESP PANEL BY RT-PCR (FLU A&B, COVID) ARPGX2 - Abnormal; Notable for the following components:      Result Value   SARS Coronavirus 2 by RT PCR POSITIVE (*)    All other components within normal limits  COMPREHENSIVE METABOLIC PANEL - Abnormal; Notable for the following components:   Sodium 127 (*)    Chloride 95 (*)    Glucose, Bld 149 (*)    BUN 7 (*)    Calcium 8.4 (*)    Albumin 1.6 (*)    AST 112 (*)    Alkaline Phosphatase 518 (*)    All other components within normal limits  CBC - Abnormal; Notable for the following components:   WBC 2.6 (*)    RBC 3.44 (*)    Hemoglobin 11.2 (*)    HCT 32.9 (*)    nRBC 0.8 (*)    All other components within normal limits  URINALYSIS, ROUTINE W REFLEX MICROSCOPIC - Abnormal; Notable for the following components:   Hgb urine dipstick SMALL (*)    Bilirubin Urine SMALL (*)    Nitrite POSITIVE (*)    Leukocytes,Ua SMALL (*)    All other components within normal limits  URINALYSIS, MICROSCOPIC (REFLEX) - Abnormal; Notable for the following components:   Bacteria, UA MANY (*)    All other components within normal limits  LIPASE, BLOOD    EKG EKG Interpretation  Date/Time:  Saturday February 16 2021 01:16:50 EST Ventricular Rate:  92 PR Interval:  129 QRS Duration: 86 QT Interval:  339 QTC Calculation: 420 R Axis:   88 Text Interpretation: Sinus rhythm Multiple premature complexes, vent & supraven Aberrant conduction of SV complex(es) Anterior infarct, old Confirmed by Ripley Fraise [phone]) on 02/16/2021 1:35:25 AM  Radiology DG Chest Port 1 View  Result Date: 02/16/2021 CLINICAL DATA:  Abdominal pain x1 month. EXAM: PORTABLE CHEST 1 VIEW COMPARISON:  January 21, 2021 FINDINGS: The heart size and mediastinal contours are within normal limits. Chronic appearing increased lung markings are noted. Very mild atelectasis is noted within the lateral aspect  of the left lung base. There is no evidence of acute infiltrate, pleural effusion or pneumothorax. The visualized skeletal structures are unremarkable. IMPRESSION: Very mild left basilar atelectasis. Electronically Signed   By: Virgina Norfolk M.D.   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  Reports diffuse abdominal pain.  We will proceed with CT imaging.  Patient has a chronic right inguinal hernia that is  essentially nontender  [DW]  0450 No new/emergent findings on CT imaging. [DW]  [phone] Patient reports continued pain, reports something is "turning around "in his stomach and thinks it is an ulcer.  Patient denies any NSAID abuse, but he does use alcohol counseled him on close follow-up with his physicians and to stop alcohol.  We will start a PPI.  Will give referral to GI.  Patient was informed of his multiple incidental findings and lymphadenopathy and need for close follow-up with infectious disease [DW]    Clinical Course User Index [DW] Ripley Fraise, MD                           Medical Decision Making  This patient presents to the ED for concern of abdominal pain, this involves an extensive number of treatment options, and is a complaint that carries with it a high risk of complications and morbidity.  The differential diagnosis includes bowel obstruction, bowel perforation, cholecystitis, pancreatitis, appendicitis, diverticulitis  Comorbidities  that complicate the patient evaluation: Patients presentation is complicated by their history of HIV/AIDS  Social Determinants of Health: Patients poor adherence increases the complexity of managing their presentation  Additional history obtained:  Records reviewed previous admission documents  Lab Tests: I Ordered, and personally interpreted labs.  The pertinent results include: Mild hyponatremia  Imaging Studies ordered: I ordered imaging studies including CT scan abd/pelvis and X-ray chest x-ray is negative for acute finding I independently visualized and interpreted imaging which showed no acute findings I agree with the radiologist interpretation  Cardiac Monitoring: The patient was maintained on a cardiac monitor.  I personally viewed and interpreted the cardiac monitor which showed an underlying rhythm of:  sinus rhythm  Medicines ordered and prescription drug management: I ordered medication including IV fentanyl for pain Reevaluation of the patient after these medicines showed that the patient    improved   Reevaluation: After the interventions noted above, I reevaluated the patient and found that they have :stayed the same  Complexity of problems addressed: Patients presentation is most consistent with  exacerbation of chronic illness      Disposition: After consideration of the diagnostic results and the patients response to treatment,  I feel that the patent would benefit from discharge  .   Multiple chronic/incidental findings noted, but no acute/emergent findings.  Patient reports continued pain, will give a dose of pain medicine and Protonix.  Due to postprandial eating this could be related to gastritis versus PUD Overall though, patient can be discharged home.  I counseled patient extensively on need for follow-up with infectious disease who has been trying to contact him         Final Clinical Impression(s) / ED Diagnoses Final diagnoses:   Epigastric pain  Hyponatremia    Rx / DC Orders ED Discharge Orders          Ordered    pantoprazole (PROTONIX) 40 MG tablet  Daily        02/16/21 0450              Ripley Fraise, MD 02/16/21 [phone]

## 2021-02-16 NOTE — ED Provider Notes (Signed)
EKG Interpretation  Date/Time:  Saturday February 16 2021 05:31:34 EST Ventricular Rate:  212 PR Interval:  192 QRS Duration: 84 QT Interval:  229 QTC Calculation: 430 R Axis:   92 Text Interpretation: Supraventricular tachycardia Right axis deviation Anteroseptal infarct, old Repolarization abnormality, prob rate related Confirmed by Ripley Fraise [phone]) on 02/16/2021 5:38:53 AM       Soon after get his medications patient went into SVT with a heart rate of 200.  On review of records he has had this previously.  Prior to being given any medications, he spontaneously converted back to sinus rhythm   EKG Interpretation  Date/Time:  Saturday February 16 2021 05:48:12 EST Ventricular Rate:  81 PR Interval:  141 QRS Duration: 85 QT Interval:  372 QTC Calculation: 432 R Axis:   88 Text Interpretation: Sinus rhythm Anterior infarct, old SVT resolved Confirmed by Ripley Fraise [phone]) on 02/16/2021 5:52:45 AM       Patient had extensive cardiology work-up including an aborted ablation.  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  Patient is also well known to be nonadherent to his medical regimen.  At this time, will give an oral dose of Lopressor, but will defer further outpatient treatment until he is seen by cardiology as an outpatient.  We will monitor in the ED for another hour and if no further episodes he will be safe for discharge   Ripley Fraise, MD 02/16/21 [phone]

## 2021-03-13 ENCOUNTER — Encounter: Payer: Self-pay | Admitting: Gastroenterology

## 2021-03-14 ENCOUNTER — Encounter (HOSPITAL_COMMUNITY): Payer: Self-pay | Admitting: Radiology

## 2021-03-15 ENCOUNTER — Other Ambulatory Visit: Payer: Self-pay

## 2021-03-15 ENCOUNTER — Emergency Department (HOSPITAL_COMMUNITY): Payer: Medicaid Other

## 2021-03-15 ENCOUNTER — Emergency Department (HOSPITAL_COMMUNITY)
Admission: EM | Admit: 2021-03-15 | Discharge: 2021-03-15 | Disposition: A | Discharge status: Home / Self Care | Payer: Medicaid Other | Attending: Emergency Medicine | Admitting: Emergency Medicine

## 2021-03-15 ENCOUNTER — Encounter (HOSPITAL_COMMUNITY): Payer: Self-pay | Admitting: Emergency Medicine

## 2021-03-15 DIAGNOSIS — R109 Unspecified abdominal pain: Secondary | ICD-10-CM | POA: Insufficient documentation

## 2021-03-15 DIAGNOSIS — R079 Chest pain, unspecified: Secondary | ICD-10-CM | POA: Diagnosis not present

## 2021-03-15 DIAGNOSIS — B20 Human immunodeficiency virus [HIV] disease: Secondary | ICD-10-CM | POA: Diagnosis present

## 2021-03-15 DIAGNOSIS — B451 Cerebral cryptococcosis: Secondary | ICD-10-CM | POA: Diagnosis present

## 2021-03-15 DIAGNOSIS — M549 Dorsalgia, unspecified: Secondary | ICD-10-CM | POA: Diagnosis not present

## 2021-03-15 DIAGNOSIS — R634 Abnormal weight loss: Secondary | ICD-10-CM | POA: Diagnosis present

## 2021-03-15 DIAGNOSIS — R630 Anorexia: Secondary | ICD-10-CM | POA: Insufficient documentation

## 2021-03-15 DIAGNOSIS — A312 Disseminated mycobacterium avium-intracellulare complex (DMAC): Secondary | ICD-10-CM | POA: Diagnosis present

## 2021-03-15 DIAGNOSIS — A310 Pulmonary mycobacterial infection: Secondary | ICD-10-CM | POA: Diagnosis present

## 2021-03-15 LAB — URINALYSIS, ROUTINE W REFLEX MICROSCOPIC

## 2021-03-15 LAB — URINALYSIS, MICROSCOPIC (REFLEX): WBC, UA: >50 WBC/hpf (ref 0–5)

## 2021-03-15 LAB — CBC WITH DIFFERENTIAL/PLATELET
Abs Immature Granulocytes: 0 10*3/uL (ref 0.00–0.07)
Basophils Absolute: 0 10*3/uL (ref 0.0–0.1)
Basophils Relative: 1 %
Eosinophils Absolute: 0 10*3/uL (ref 0.0–0.5)
Eosinophils Relative: 0 %
HCT: 24.3 % — ABNORMAL LOW (ref 39.0–52.0)
Hemoglobin: 8.4 g/dL — ABNORMAL LOW (ref 13.0–17.0)
Lymphocytes Relative: 0 %
Lymphs Abs: 0 10*3/uL — ABNORMAL LOW (ref 0.7–4.0)
MCH: 33.6 pg (ref 26.0–34.0)
MCHC: 34.6 g/dL (ref 30.0–36.0)
MCV: 97.2 fL (ref 80.0–100.0)
Monocytes Absolute: 0.2 10*3/uL (ref 0.1–1.0)
Monocytes Relative: 6 %
Neutro Abs: 2.8 10*3/uL (ref 1.7–7.7)
Neutrophils Relative %: 93 %
Platelets: 175 10*3/uL (ref 150–400)
RBC: 2.5 MIL/uL — ABNORMAL LOW (ref 4.22–5.81)
RDW: 15.9 % — ABNORMAL HIGH (ref 11.5–15.5)
WBC: 3 10*3/uL — ABNORMAL LOW (ref 4.0–10.5)
nRBC: 0 % (ref 0.0–0.2)
nRBC: 0 /100 WBC

## 2021-03-15 LAB — COMPREHENSIVE METABOLIC PANEL
ALT: 15 U/L (ref 0–44)
AST: 65 U/L — ABNORMAL HIGH (ref 15–41)
Albumin: <1.5 g/dL — ABNORMAL LOW (ref 3.5–5.0)
Alkaline Phosphatase: 352 U/L — ABNORMAL HIGH (ref 38–126)
Anion gap: 10 (ref 5–15)
BUN: 7 mg/dL — ABNORMAL LOW (ref 8–23)
CO2: 22 mmol/L (ref 22–32)
Calcium: 7.5 mg/dL — ABNORMAL LOW (ref 8.9–10.3)
Chloride: 97 mmol/L — ABNORMAL LOW (ref 98–111)
Creatinine, Ser: 0.89 mg/dL (ref 0.61–1.24)
GFR, Estimated: >60 mL/min (ref >60)
Glucose, Bld: 101 mg/dL — ABNORMAL HIGH (ref 70–99)
Potassium: 3.4 mmol/L — ABNORMAL LOW (ref 3.5–5.1)
Sodium: 129 mmol/L — ABNORMAL LOW (ref 135–145)
Total Bilirubin: 0.7 mg/dL (ref 0.3–1.2)
Total Protein: 6.3 g/dL — ABNORMAL LOW (ref 6.5–8.1)

## 2021-03-15 LAB — POC OCCULT BLOOD, ED: Fecal Occult Bld: NEGATIVE

## 2021-03-15 LAB — LIPASE, BLOOD: Lipase: 33 U/L (ref 11–51)

## 2021-03-15 IMAGING — CT CT L SPINE W/O CM
2 of 4 series · 10 of 33 positions shown, 12 images · IV contrast (Omni 300)
Comparison: CT the abdomen and pelvis [DATE]. CT the chest
[DATE].

CLINICAL DATA: 65-year-old male with history of diffuse body pain
for 1 month, most severe in the back. Lymphadenopathy.

EXAM:
CT CHEST, ABDOMEN, AND PELVIS WITH CONTRAST
CT THORACIC SPINE WITHOUT CONTRAST
CT LUMBAR SPINE WITHOUT CONTRAST
TECHNIQUE: Multidetector CT imaging of the chest, abdomen and pelvis was
performed following the standard protocol during bolus
administration of intravenous contrast.

[Series 1: lumbar spine sagl bone · coronal · 0.28mm/px · 3 of 73 slices shown]
[im 15/73  bone]
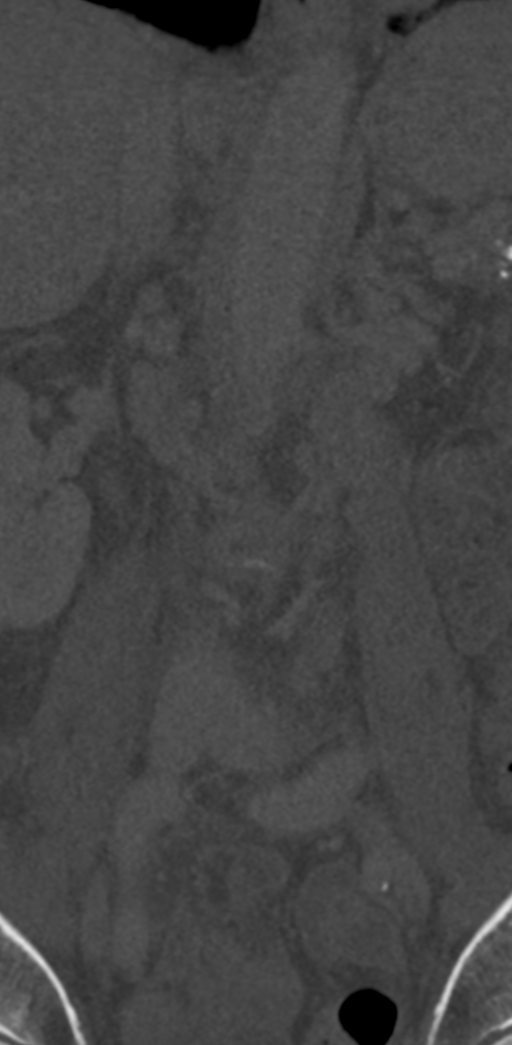
[im 29/73  bone]
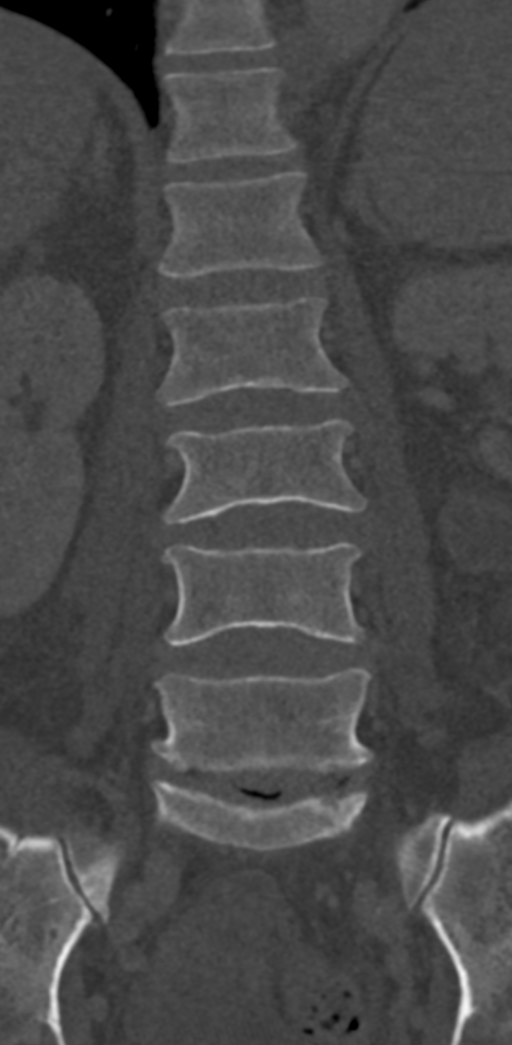
[im 44/73  bone]
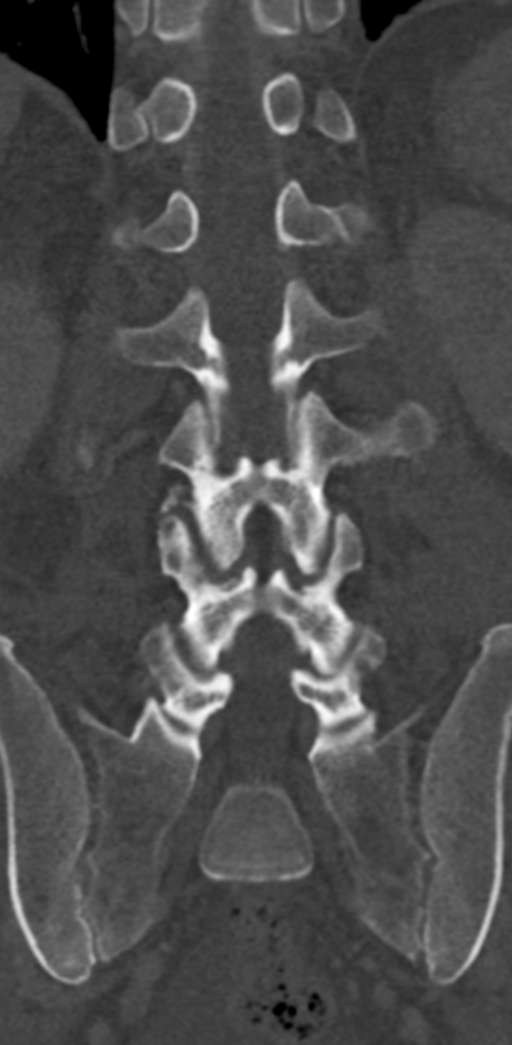

[Series 4: l spine thin · axial · 0.28mm/px · z∈[-1024,-805]mm · 7 of 293 slices shown, 9 images]
[im 37/293  soft-tissue]
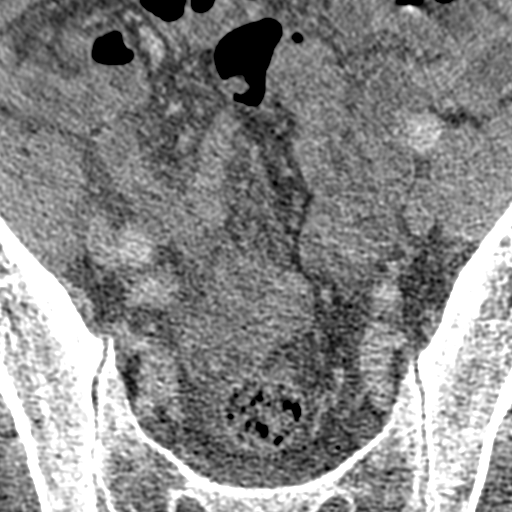
[im 37/293  bone]
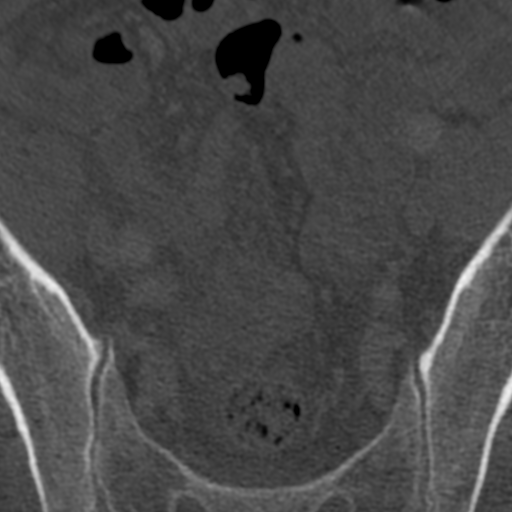
[im 74/293  bone]
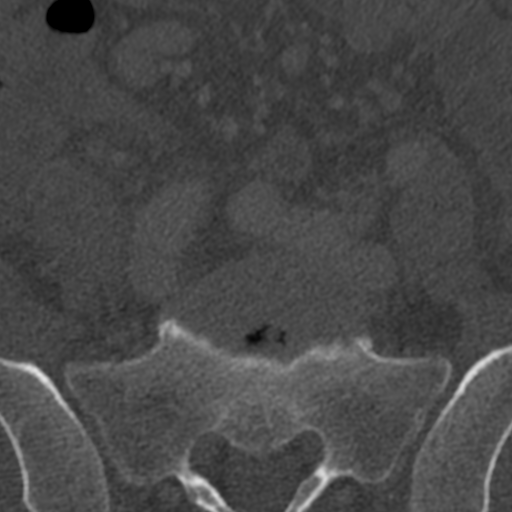
[im 110/293  bone]
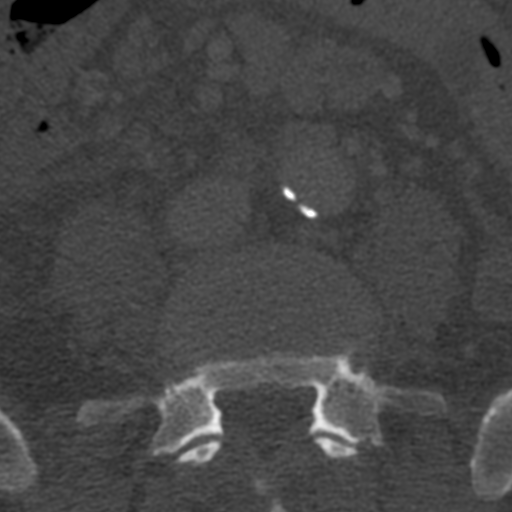
[im 147/293  bone]
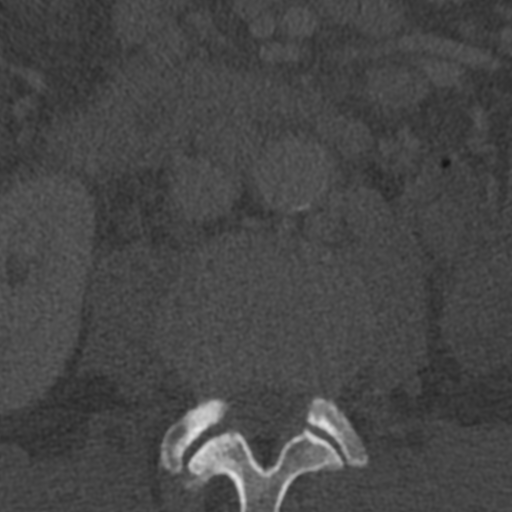
[im 183/293  soft-tissue]
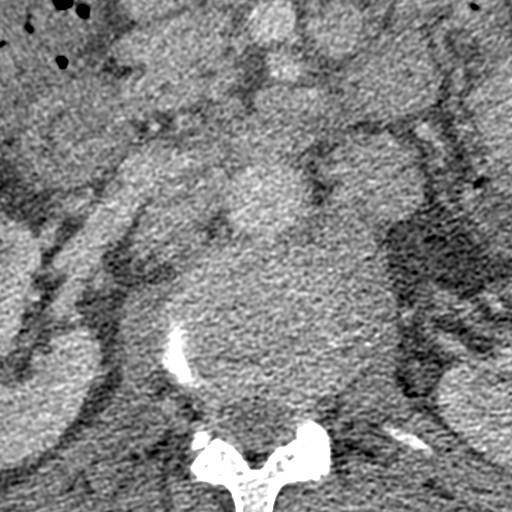
[im 183/293  bone]
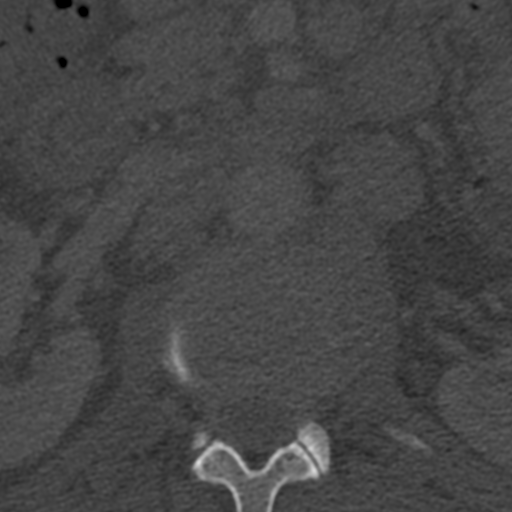
[im 220/293  bone]
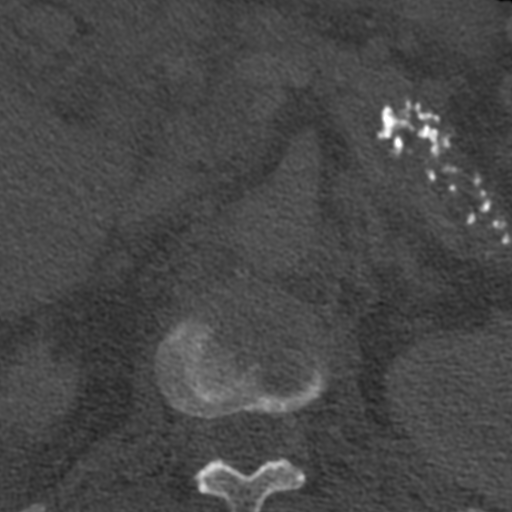
[im 256/293  bone]
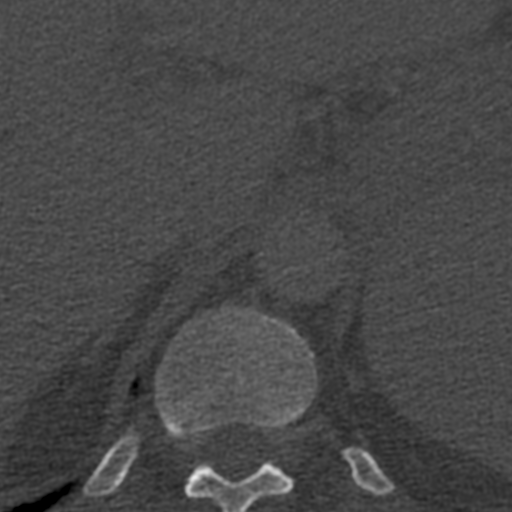

[10 of 33 positions shown; findings below may reference images not displayed]

Multidetector CT imaging of the thoracic spine was performed without
intravenous contrast administration. Multiplanar CT image
reconstructions were also generated.

Multidetector CT imaging of the lumbar spine was performed without
intravenous contrast administration. Multiplanar CT image
reconstructions were also generated.

RADIATION DOSE REDUCTION: This exam was performed according to the
departmental dose-optimization program which includes automated
exposure control, adjustment of the mA and/or kV according to
patient size and/or use of iterative reconstruction technique.

CONTRAST:  80mL OMNIPAQUE IOHEXOL 300 MG/ML  SOLN
FINDINGS: CT CHEST FINDINGS

Cardiovascular: Heart size is normal. Trace amount of pericardial
fluid and/or thickening, unlikely to be of any hemodynamic
significance at this time. No pericardial calcification.
Atherosclerotic calcifications are noted in the thoracic aorta. No
definite coronary artery calcifications are noted.

Mediastinum/Nodes: Extensive mediastinal and hilar lymphadenopathy
is again noted, similar to the prior study. The largest lymph node
on today's examination is in the subcarinal nodal station measuring
up to 3.3 cm in short axis (axial image 34 of series 3), with some
internal areas of low attenuation which may reflect internal
necrosis. Largest hilar lymph node is on the left measuring 1.8 cm
in short axis (axial image 27 of series 3). Esophagus is
unremarkable in appearance. No axillary lymphadenopathy.

Lungs/Pleura: Extensive cylindrical and varicose bronchiectasis is
again noted throughout the left lung, most apparent in the left
upper lobe. Multiple pulmonary nodules are again noted, relatively
similar to the prior study. The majority of these are clustered in
the left lung, solid in appearance, and measuring up to 1.5 x 1.1 cm
in the medial aspect of the left upper lobe (axial image 35 of
series 5). In addition, there is a nodule in the anterior aspect of
the left lower lobe near the lung base (axial image 114 of series 5)
which is slightly smaller than the prior examination, but now
demonstrates some internal cavitation and thick walls, currently
measuring 1.8 x 1.6 cm. Trace amount of left pleural
fluid/thickening, decreased compared to the prior study. No right
pleural effusion.

Musculoskeletal: No acute abnormality of the thoracic spine. There
are no aggressive appearing lytic or blastic lesions noted in the
visualized portions of the skeleton.

CT ABDOMEN PELVIS FINDINGS

Hepatobiliary: No suspicious cystic or solid hepatic lesions. No
intra or extrahepatic biliary ductal dilatation. Periportal edema is
noted. Gallbladder is not distended. No calcified gallstones are
noted. Gallbladder wall appears edematous, and is surrounded by a
small volume of ascites (nonspecific).

Pancreas: Innumerable coarse calcifications are noted throughout the
pancreas, indicative of chronic pancreatitis. No discrete pancreatic
mass. No pancreatic ductal dilatation. No focal peripancreatic fluid
collections or definite inflammatory changes.

Spleen: Unremarkable.

Adrenals/Urinary Tract: Cortical atrophy in the lower pole the left
kidney again noted. Subtle area of hypoenhancement in the interpolar
region of the right kidney (axial image 77 of series 3), new
compared to the prior examination. Bilateral adrenal glands are
normal in appearance. No hydroureteronephrosis. Profound asymmetric
thickening of the wall of the urinary bladder most severe
anterolaterally on the right where this measures up to 1.4 cm in
thickness.

Stomach/Bowel: The appearance of the stomach is normal. There is no
pathologic dilatation of small bowel or colon. Normal appendix.

Vascular/Lymphatic: Aortic atherosclerosis, without evidence of
aneurysm or dissection in the abdominal or pelvic vasculature.
Extensive lymphadenopathy again noted throughout the abdomen, most
evident in the retroperitoneum where the largest nodal mass measures
3.2 x 3.9 cm in the left para-aortic nodal station (axial image 74
of series 3).

Reproductive: Prostate gland and seminal vesicles are unremarkable
in appearance.

Other: Small volume of ascites.  No pneumoperitoneum.

Musculoskeletal: No acute abnormality of the lumbar spine. There are
no aggressive appearing lytic or blastic lesions noted in the
visualized portions of the skeleton.
IMPRESSION: 1. Extensive lymphadenopathy again noted in the chest and abdomen,
highly concerning for malignancy such as lymphoma or leukemia.
Overall, this is very similar to the prior examination, with
measurements as detailed above.
2. There are findings in the lungs which are suggestive of a chronic
atypical infectious process. Further evaluation by Pulmonology
and/or infectious disease is recommended. One of these nodules in
the left lower lobe has become centrally cavitary compared to the
prior study. Neoplasm is not entirely excluded, but not strongly
favored at this time.
3. New subtle area of hypoperfusion in the lateral aspect of the
interpolar region of the right kidney, nonspecific, but potentially
indicative of pyelonephritis. Correlation with urinalysis is
recommended.
4. Profound asymmetric mural thickening in the urinary bladder again
noted. Urologic consultation is recommended to exclude the
possibility of infiltrative bladder wall neoplasm.
5. No acute abnormality of the thoracolumbar spine to account for
the patient's symptoms.
6. Small volume of ascites.
7. Morphologic changes in the pancreas indicative of chronic
pancreatitis.
8. Aortic atherosclerosis.
9. Additional incidental findings, as above.

## 2021-03-15 IMAGING — CT CT CHEST-ABD-PELV W/ CM
2 of 5 series · 11 of 36 positions shown, 12 images · IV contrast (Omni 300)
Comparison: CT the abdomen and pelvis [DATE]. CT the chest
[DATE].

CLINICAL DATA: 65-year-old male with history of diffuse body pain
for 1 month, most severe in the back. Lymphadenopathy.

EXAM:
CT CHEST, ABDOMEN, AND PELVIS WITH CONTRAST
CT THORACIC SPINE WITHOUT CONTRAST
CT LUMBAR SPINE WITHOUT CONTRAST
TECHNIQUE: Multidetector CT imaging of the chest, abdomen and pelvis was
performed following the standard protocol during bolus
administration of intravenous contrast.

[Series 3: cap with 5mm st · axial · 0.89mm/px · z∈[-1156,-591]mm · 8 of 139 slices shown, 9 images]
[im 13/139  mediastinal]
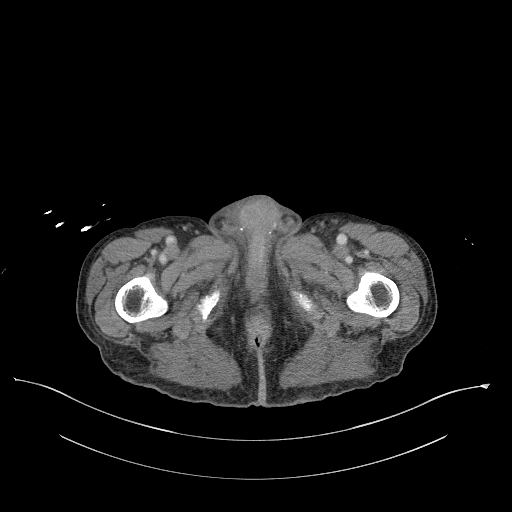
[im 13/139  bone]
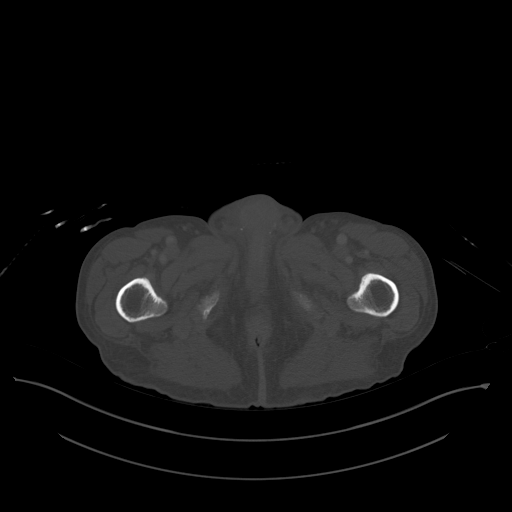
[im 26/139  mediastinal]
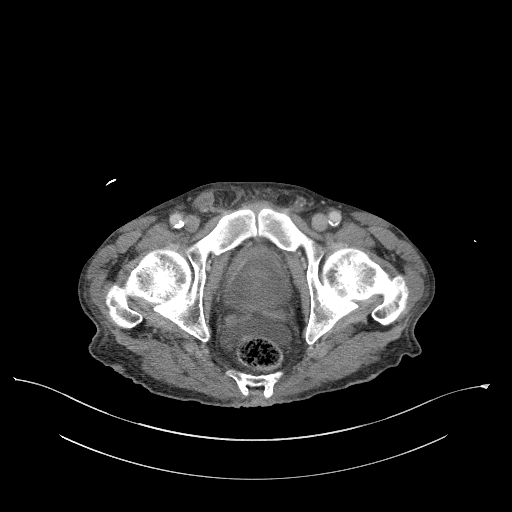
[im 51/139  mediastinal]
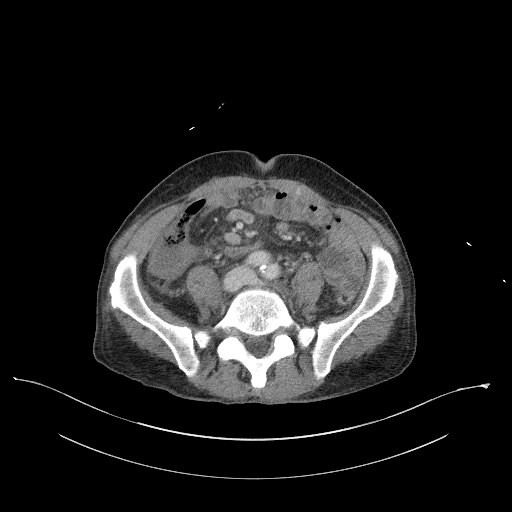
[im 63/139  mediastinal]
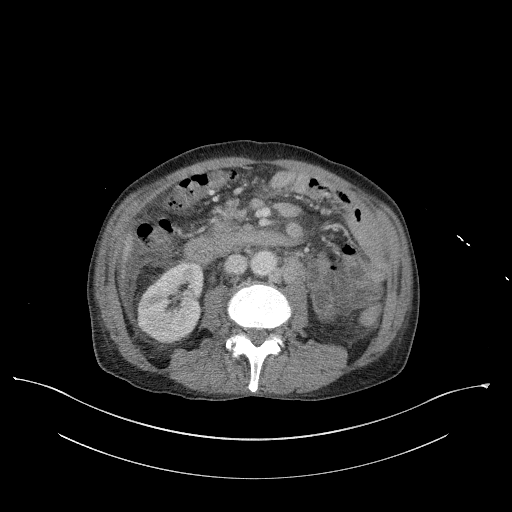
[im 76/139  mediastinal]
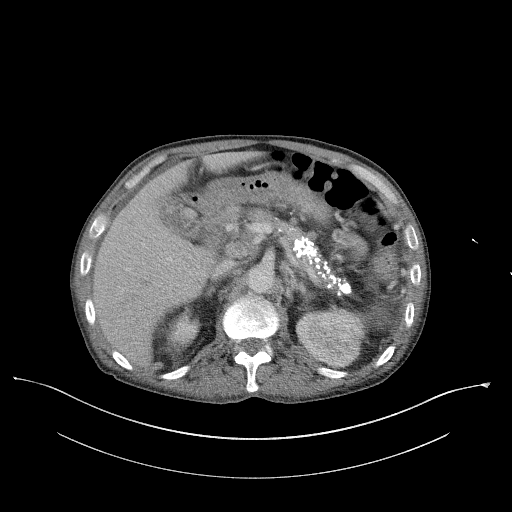
[im 88/139  mediastinal]
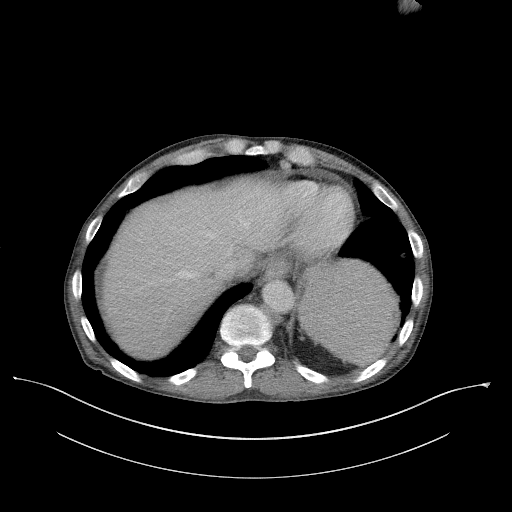
[im 113/139  mediastinal]
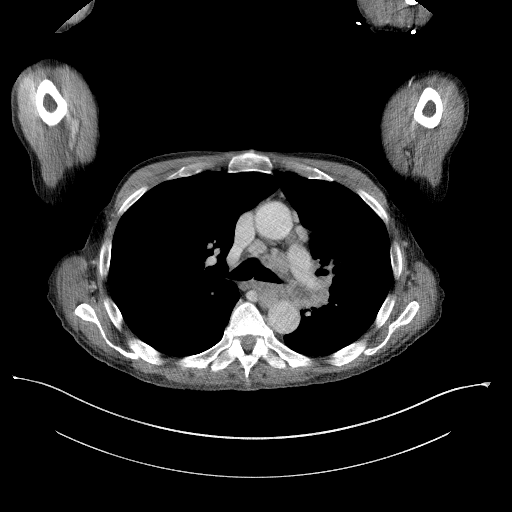
[im 126/139  mediastinal]
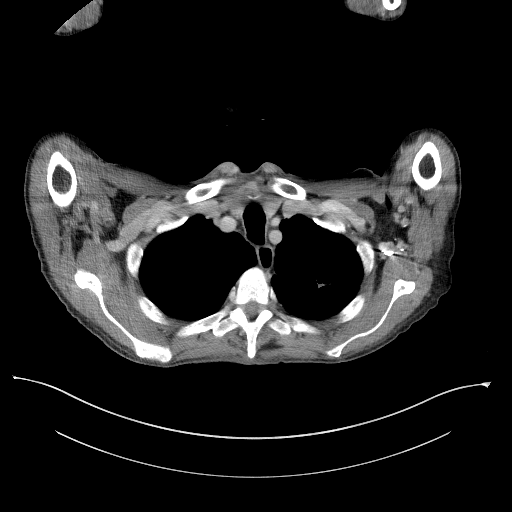

[Series 7: cap with 3mm st cor · coronal · 0.67mm/px · 3 of 146 slices shown]
[im 30/146  mediastinal]
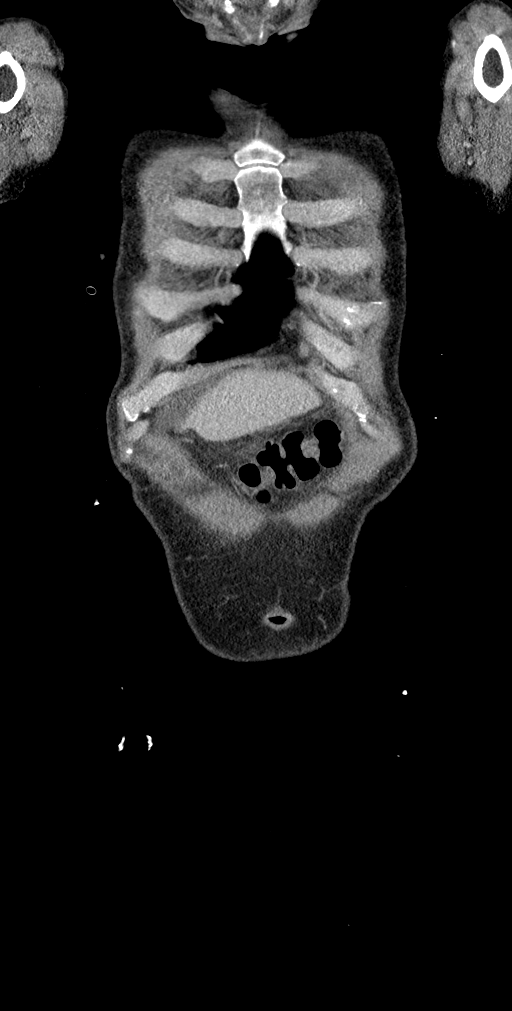
[im 59/146  mediastinal]
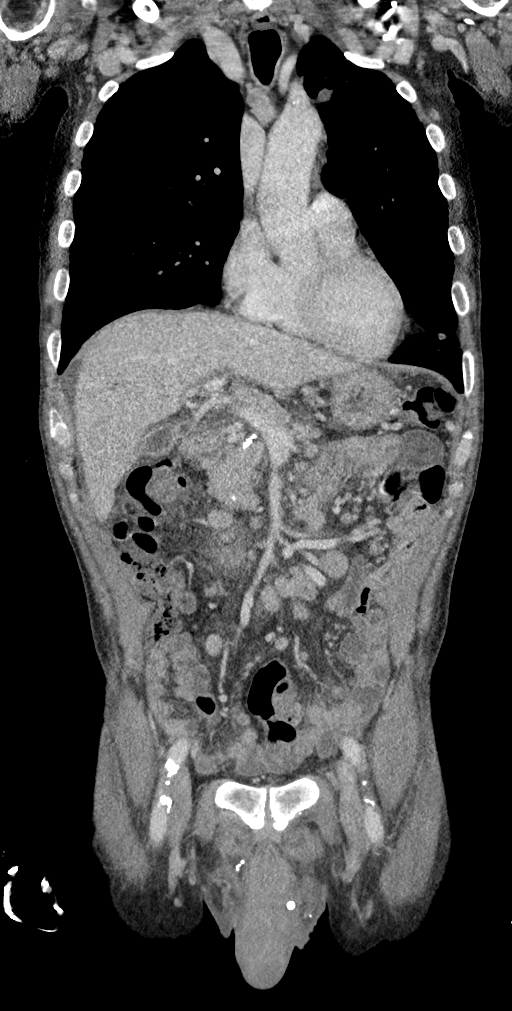
[im 88/146  mediastinal]
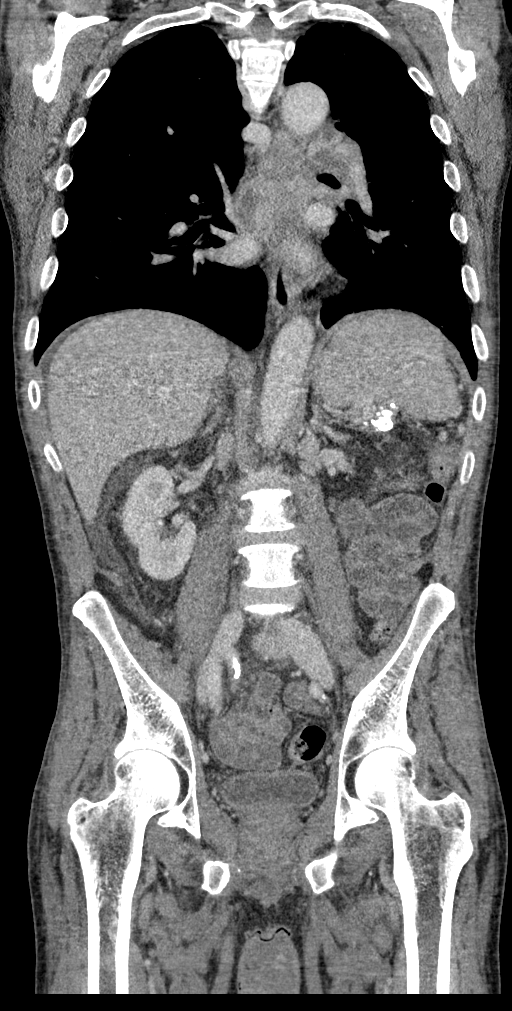

[11 of 36 positions shown; findings below may reference images not displayed]

Multidetector CT imaging of the thoracic spine was performed without
intravenous contrast administration. Multiplanar CT image
reconstructions were also generated.

Multidetector CT imaging of the lumbar spine was performed without
intravenous contrast administration. Multiplanar CT image
reconstructions were also generated.

RADIATION DOSE REDUCTION: This exam was performed according to the
departmental dose-optimization program which includes automated
exposure control, adjustment of the mA and/or kV according to
patient size and/or use of iterative reconstruction technique.

CONTRAST:  80mL OMNIPAQUE IOHEXOL 300 MG/ML  SOLN
FINDINGS: CT CHEST FINDINGS

Cardiovascular: Heart size is normal. Trace amount of pericardial
fluid and/or thickening, unlikely to be of any hemodynamic
significance at this time. No pericardial calcification.
Atherosclerotic calcifications are noted in the thoracic aorta. No
definite coronary artery calcifications are noted.

Mediastinum/Nodes: Extensive mediastinal and hilar lymphadenopathy
is again noted, similar to the prior study. The largest lymph node
on today's examination is in the subcarinal nodal station measuring
up to 3.3 cm in short axis (axial image 34 of series 3), with some
internal areas of low attenuation which may reflect internal
necrosis. Largest hilar lymph node is on the left measuring 1.8 cm
in short axis (axial image 27 of series 3). Esophagus is
unremarkable in appearance. No axillary lymphadenopathy.

Lungs/Pleura: Extensive cylindrical and varicose bronchiectasis is
again noted throughout the left lung, most apparent in the left
upper lobe. Multiple pulmonary nodules are again noted, relatively
similar to the prior study. The majority of these are clustered in
the left lung, solid in appearance, and measuring up to 1.5 x 1.1 cm
in the medial aspect of the left upper lobe (axial image 35 of
series 5). In addition, there is a nodule in the anterior aspect of
the left lower lobe near the lung base (axial image 114 of series 5)
which is slightly smaller than the prior examination, but now
demonstrates some internal cavitation and thick walls, currently
measuring 1.8 x 1.6 cm. Trace amount of left pleural
fluid/thickening, decreased compared to the prior study. No right
pleural effusion.

Musculoskeletal: No acute abnormality of the thoracic spine. There
are no aggressive appearing lytic or blastic lesions noted in the
visualized portions of the skeleton.

CT ABDOMEN PELVIS FINDINGS

Hepatobiliary: No suspicious cystic or solid hepatic lesions. No
intra or extrahepatic biliary ductal dilatation. Periportal edema is
noted. Gallbladder is not distended. No calcified gallstones are
noted. Gallbladder wall appears edematous, and is surrounded by a
small volume of ascites (nonspecific).

Pancreas: Innumerable coarse calcifications are noted throughout the
pancreas, indicative of chronic pancreatitis. No discrete pancreatic
mass. No pancreatic ductal dilatation. No focal peripancreatic fluid
collections or definite inflammatory changes.

Spleen: Unremarkable.

Adrenals/Urinary Tract: Cortical atrophy in the lower pole the left
kidney again noted. Subtle area of hypoenhancement in the interpolar
region of the right kidney (axial image 77 of series 3), new
compared to the prior examination. Bilateral adrenal glands are
normal in appearance. No hydroureteronephrosis. Profound asymmetric
thickening of the wall of the urinary bladder most severe
anterolaterally on the right where this measures up to 1.4 cm in
thickness.

Stomach/Bowel: The appearance of the stomach is normal. There is no
pathologic dilatation of small bowel or colon. Normal appendix.

Vascular/Lymphatic: Aortic atherosclerosis, without evidence of
aneurysm or dissection in the abdominal or pelvic vasculature.
Extensive lymphadenopathy again noted throughout the abdomen, most
evident in the retroperitoneum where the largest nodal mass measures
3.2 x 3.9 cm in the left para-aortic nodal station (axial image 74
of series 3).

Reproductive: Prostate gland and seminal vesicles are unremarkable
in appearance.

Other: Small volume of ascites.  No pneumoperitoneum.

Musculoskeletal: No acute abnormality of the lumbar spine. There are
no aggressive appearing lytic or blastic lesions noted in the
visualized portions of the skeleton.
IMPRESSION: 1. Extensive lymphadenopathy again noted in the chest and abdomen,
highly concerning for malignancy such as lymphoma or leukemia.
Overall, this is very similar to the prior examination, with
measurements as detailed above.
2. There are findings in the lungs which are suggestive of a chronic
atypical infectious process. Further evaluation by Pulmonology
and/or infectious disease is recommended. One of these nodules in
the left lower lobe has become centrally cavitary compared to the
prior study. Neoplasm is not entirely excluded, but not strongly
favored at this time.
3. New subtle area of hypoperfusion in the lateral aspect of the
interpolar region of the right kidney, nonspecific, but potentially
indicative of pyelonephritis. Correlation with urinalysis is
recommended.
4. Profound asymmetric mural thickening in the urinary bladder again
noted. Urologic consultation is recommended to exclude the
possibility of infiltrative bladder wall neoplasm.
5. No acute abnormality of the thoracolumbar spine to account for
the patient's symptoms.
6. Small volume of ascites.
7. Morphologic changes in the pancreas indicative of chronic
pancreatitis.
8. Aortic atherosclerosis.
9. Additional incidental findings, as above.

## 2021-03-15 IMAGING — CT CT T SPINE W/O CM
2 of 4 series · 10 of 33 positions shown, 12 images · IV contrast (Omni 300)
Comparison: CT the abdomen and pelvis [DATE]. CT the chest
[DATE].

CLINICAL DATA: 65-year-old male with history of diffuse body pain
for 1 month, most severe in the back. Lymphadenopathy.

EXAM:
CT CHEST, ABDOMEN, AND PELVIS WITH CONTRAST
CT THORACIC SPINE WITHOUT CONTRAST
CT LUMBAR SPINE WITHOUT CONTRAST
TECHNIQUE: Multidetector CT imaging of the chest, abdomen and pelvis was
performed following the standard protocol during bolus
administration of intravenous contrast.

[Series 2: thoracic spine coronal bone · coronal · 0.37mm/px · 3 of 96 slices shown]
[im 20/96  bone]
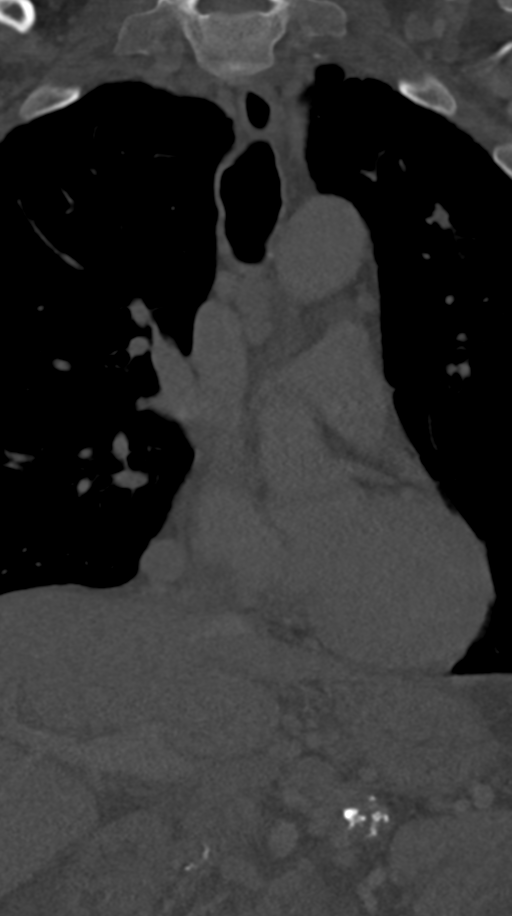
[im 39/96  bone]
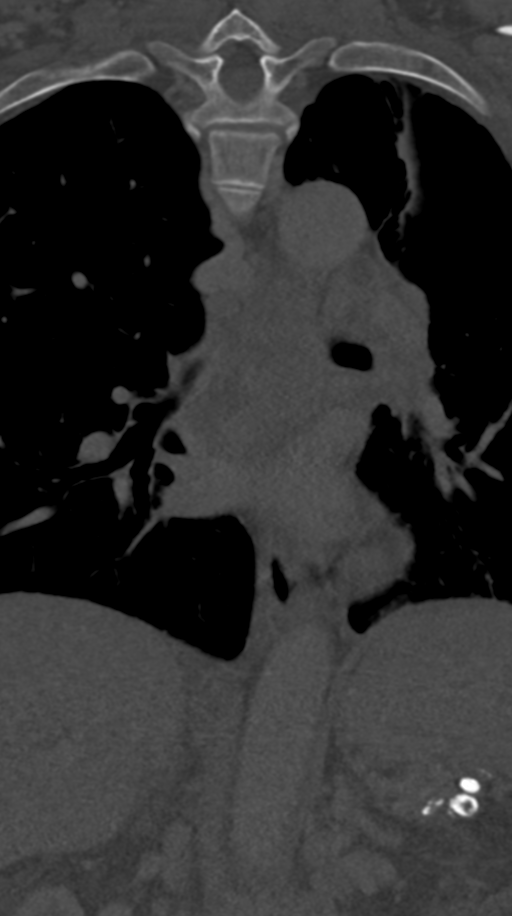
[im 58/96  bone]
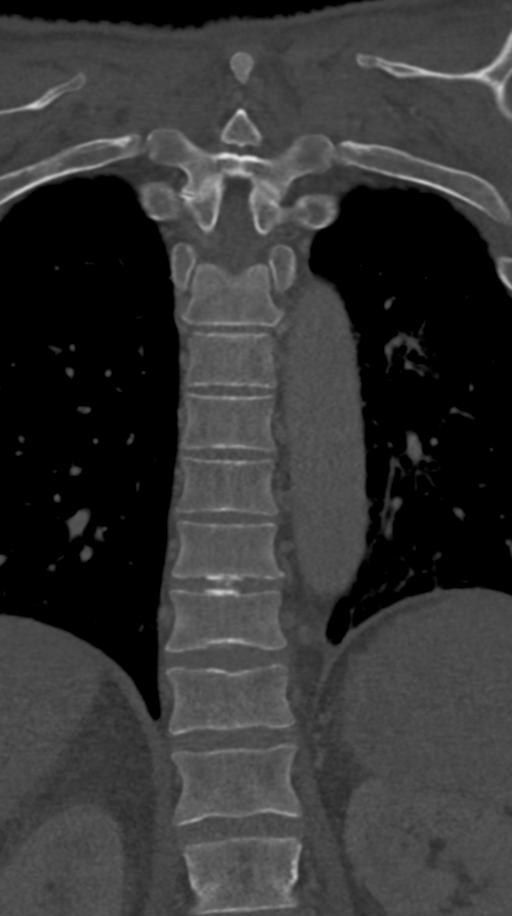

[Series 4: t spine thin · axial · 0.41mm/px · z∈[-827,-578]mm · 7 of 333 slices shown, 9 images]
[im 42/333  soft-tissue]
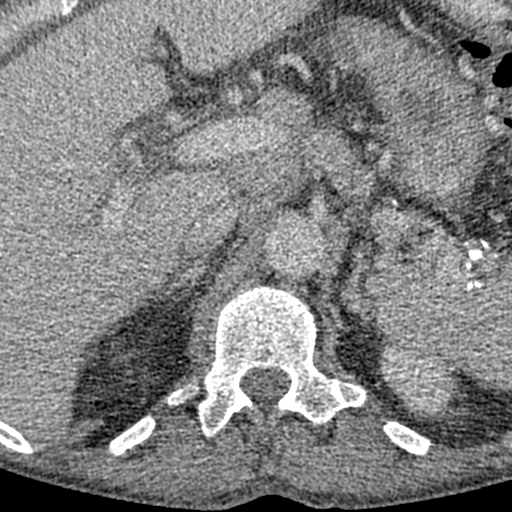
[im 42/333  bone]
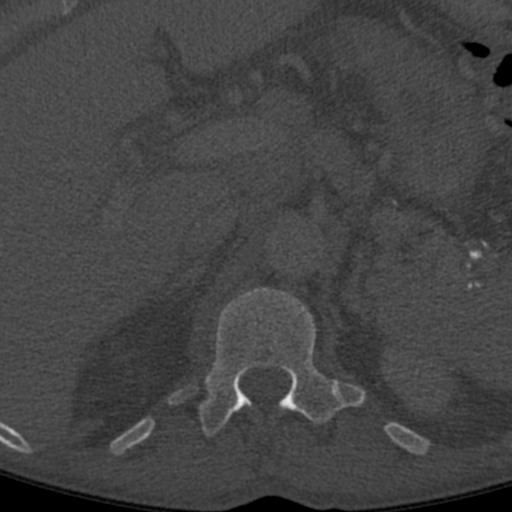
[im 84/333  bone]
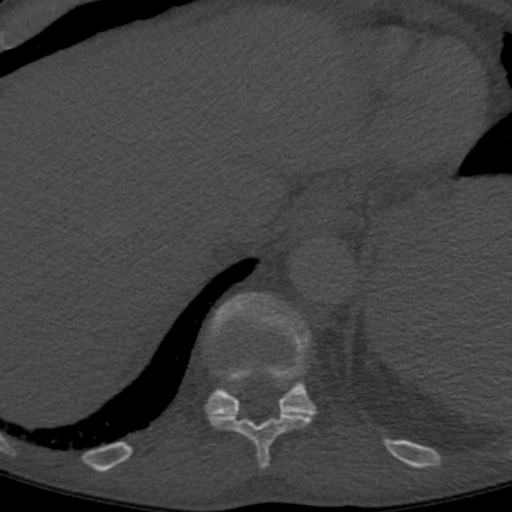
[im 125/333  bone]
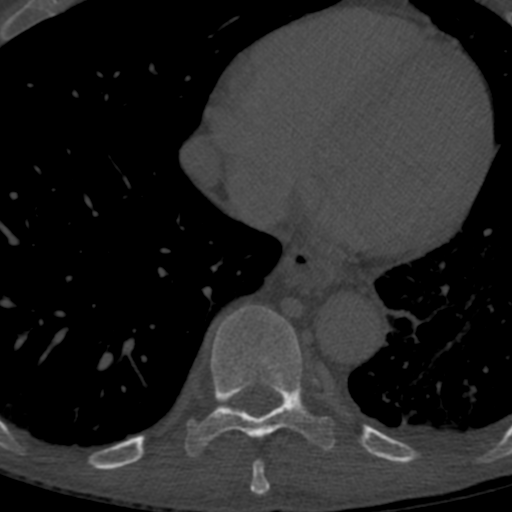
[im 167/333  bone]
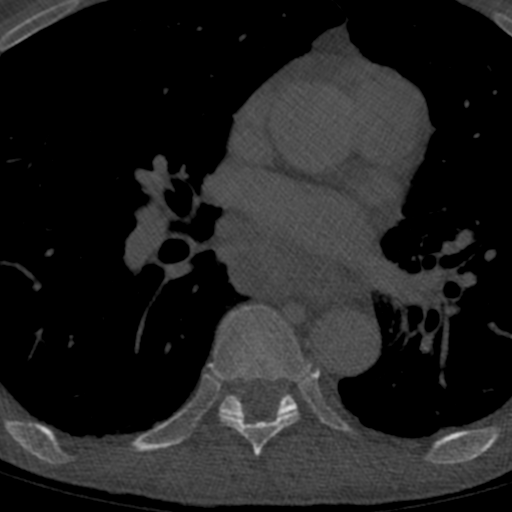
[im 208/333  soft-tissue]
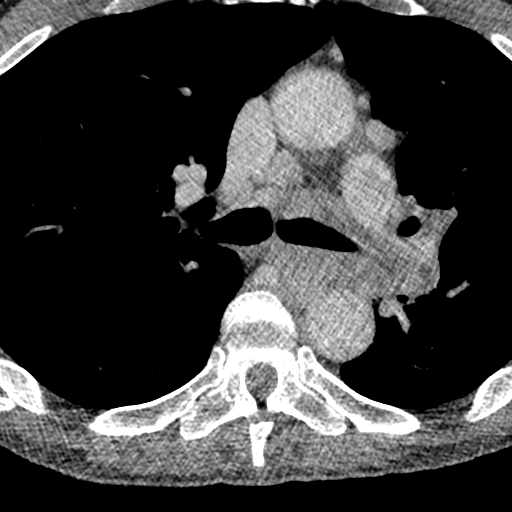
[im 208/333  bone]
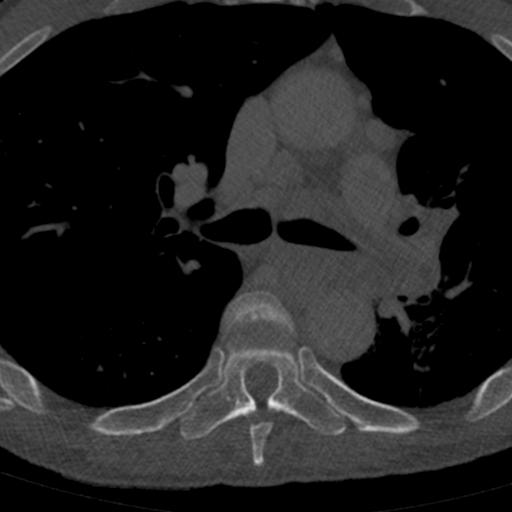
[im 250/333  bone]
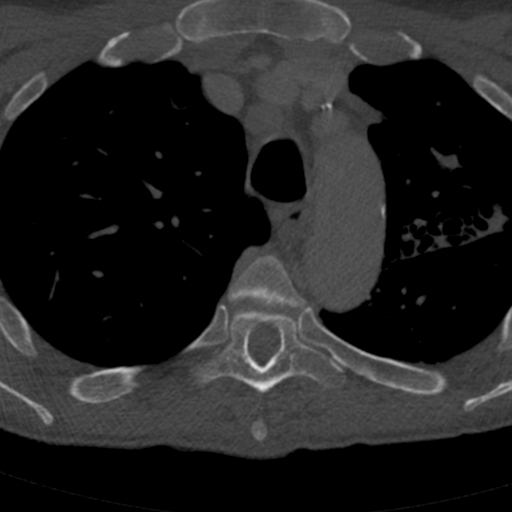
[im 291/333  bone]
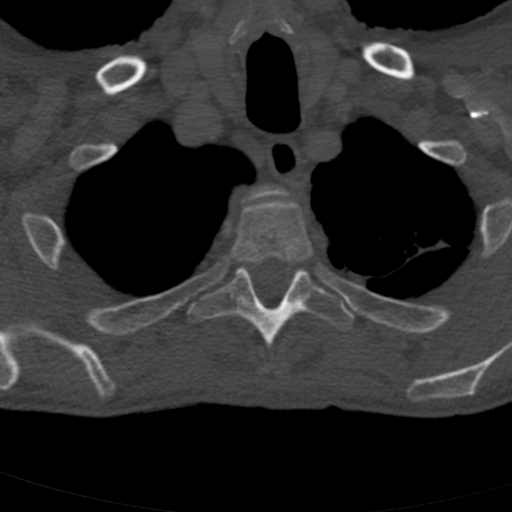

[10 of 33 positions shown; findings below may reference images not displayed]

Multidetector CT imaging of the thoracic spine was performed without
intravenous contrast administration. Multiplanar CT image
reconstructions were also generated.

Multidetector CT imaging of the lumbar spine was performed without
intravenous contrast administration. Multiplanar CT image
reconstructions were also generated.

RADIATION DOSE REDUCTION: This exam was performed according to the
departmental dose-optimization program which includes automated
exposure control, adjustment of the mA and/or kV according to
patient size and/or use of iterative reconstruction technique.

CONTRAST:  80mL OMNIPAQUE IOHEXOL 300 MG/ML  SOLN
FINDINGS: CT CHEST FINDINGS

Cardiovascular: Heart size is normal. Trace amount of pericardial
fluid and/or thickening, unlikely to be of any hemodynamic
significance at this time. No pericardial calcification.
Atherosclerotic calcifications are noted in the thoracic aorta. No
definite coronary artery calcifications are noted.

Mediastinum/Nodes: Extensive mediastinal and hilar lymphadenopathy
is again noted, similar to the prior study. The largest lymph node
on today's examination is in the subcarinal nodal station measuring
up to 3.3 cm in short axis (axial image 34 of series 3), with some
internal areas of low attenuation which may reflect internal
necrosis. Largest hilar lymph node is on the left measuring 1.8 cm
in short axis (axial image 27 of series 3). Esophagus is
unremarkable in appearance. No axillary lymphadenopathy.

Lungs/Pleura: Extensive cylindrical and varicose bronchiectasis is
again noted throughout the left lung, most apparent in the left
upper lobe. Multiple pulmonary nodules are again noted, relatively
similar to the prior study. The majority of these are clustered in
the left lung, solid in appearance, and measuring up to 1.5 x 1.1 cm
in the medial aspect of the left upper lobe (axial image 35 of
series 5). In addition, there is a nodule in the anterior aspect of
the left lower lobe near the lung base (axial image 114 of series 5)
which is slightly smaller than the prior examination, but now
demonstrates some internal cavitation and thick walls, currently
measuring 1.8 x 1.6 cm. Trace amount of left pleural
fluid/thickening, decreased compared to the prior study. No right
pleural effusion.

Musculoskeletal: No acute abnormality of the thoracic spine. There
are no aggressive appearing lytic or blastic lesions noted in the
visualized portions of the skeleton.

CT ABDOMEN PELVIS FINDINGS

Hepatobiliary: No suspicious cystic or solid hepatic lesions. No
intra or extrahepatic biliary ductal dilatation. Periportal edema is
noted. Gallbladder is not distended. No calcified gallstones are
noted. Gallbladder wall appears edematous, and is surrounded by a
small volume of ascites (nonspecific).

Pancreas: Innumerable coarse calcifications are noted throughout the
pancreas, indicative of chronic pancreatitis. No discrete pancreatic
mass. No pancreatic ductal dilatation. No focal peripancreatic fluid
collections or definite inflammatory changes.

Spleen: Unremarkable.

Adrenals/Urinary Tract: Cortical atrophy in the lower pole the left
kidney again noted. Subtle area of hypoenhancement in the interpolar
region of the right kidney (axial image 77 of series 3), new
compared to the prior examination. Bilateral adrenal glands are
normal in appearance. No hydroureteronephrosis. Profound asymmetric
thickening of the wall of the urinary bladder most severe
anterolaterally on the right where this measures up to 1.4 cm in
thickness.

Stomach/Bowel: The appearance of the stomach is normal. There is no
pathologic dilatation of small bowel or colon. Normal appendix.

Vascular/Lymphatic: Aortic atherosclerosis, without evidence of
aneurysm or dissection in the abdominal or pelvic vasculature.
Extensive lymphadenopathy again noted throughout the abdomen, most
evident in the retroperitoneum where the largest nodal mass measures
3.2 x 3.9 cm in the left para-aortic nodal station (axial image 74
of series 3).

Reproductive: Prostate gland and seminal vesicles are unremarkable
in appearance.

Other: Small volume of ascites.  No pneumoperitoneum.

Musculoskeletal: No acute abnormality of the lumbar spine. There are
no aggressive appearing lytic or blastic lesions noted in the
visualized portions of the skeleton.
IMPRESSION: 1. Extensive lymphadenopathy again noted in the chest and abdomen,
highly concerning for malignancy such as lymphoma or leukemia.
Overall, this is very similar to the prior examination, with
measurements as detailed above.
2. There are findings in the lungs which are suggestive of a chronic
atypical infectious process. Further evaluation by Pulmonology
and/or infectious disease is recommended. One of these nodules in
the left lower lobe has become centrally cavitary compared to the
prior study. Neoplasm is not entirely excluded, but not strongly
favored at this time.
3. New subtle area of hypoperfusion in the lateral aspect of the
interpolar region of the right kidney, nonspecific, but potentially
indicative of pyelonephritis. Correlation with urinalysis is
recommended.
4. Profound asymmetric mural thickening in the urinary bladder again
noted. Urologic consultation is recommended to exclude the
possibility of infiltrative bladder wall neoplasm.
5. No acute abnormality of the thoracolumbar spine to account for
the patient's symptoms.
6. Small volume of ascites.
7. Morphologic changes in the pancreas indicative of chronic
pancreatitis.
8. Aortic atherosclerosis.
9. Additional incidental findings, as above.

## 2021-03-15 MED ORDER — PANTOPRAZOLE SODIUM 40 MG IV SOLR
40.0000 mg | Freq: Once | INTRAVENOUS | Status: AC
  Administered 2021-03-15: 40 mg via INTRAVENOUS
  Filled 2021-03-15: qty 10

## 2021-03-15 MED ORDER — ALUM & MAG HYDROXIDE-SIMETH 200-200-20 MG/5ML PO SUSP
30.0000 mL | Freq: Once | ORAL | Status: AC
  Administered 2021-03-15: 30 mL via ORAL
  Filled 2021-03-15: qty 30

## 2021-03-15 MED ORDER — FENTANYL CITRATE PF 50 MCG/ML IJ SOSY
50.0000 ug | PREFILLED_SYRINGE | Freq: Once | INTRAMUSCULAR | Status: AC
  Administered 2021-03-15: 50 ug via INTRAVENOUS
  Filled 2021-03-15: qty 1

## 2021-03-15 MED ORDER — IOHEXOL 300 MG/ML  SOLN
80.0000 mL | Freq: Once | INTRAMUSCULAR | Status: AC | PRN
  Administered 2021-03-15: 80 mL via INTRAVENOUS

## 2021-03-15 MED ORDER — LIDOCAINE VISCOUS HCL 2 % MT SOLN
15.0000 mL | Freq: Once | OROMUCOSAL | Status: AC
  Administered 2021-03-15: 15 mL via ORAL
  Filled 2021-03-15: qty 15

## 2021-03-15 MED ORDER — LACTATED RINGERS IV BOLUS
1000.0000 mL | Freq: Once | INTRAVENOUS | Status: AC
  Administered 2021-03-15: 1000 mL via INTRAVENOUS

## 2021-03-15 MED ORDER — ACETAMINOPHEN 500 MG PO TABS
1000.0000 mg | ORAL_TABLET | Freq: Once | ORAL | Status: AC
  Administered 2021-03-15: 1000 mg via ORAL
  Filled 2021-03-15: qty 2

## 2021-03-15 NOTE — ED Provider Notes (Signed)
ID consulted on patient and feel that he can be discharged.  Pt has an appt on 2/13.  Pt is encouraged to keep that appt.  Return if worse.    Isla Pence, MD 03/15/21 1000

## 2021-03-15 NOTE — Consult Note (Signed)
[facility] Infect[facility]    Date of Admission:  03/15/2021         Reason for Consult: AIDS    Referring Provider: Isla Pence, MD Primary Care Provider:   Assessment: Patient with AIDS, non-adherence to his antiviral, history of TB, cryptococcus meningitis, MAC, and substance use disorder, who presents with diffuse pain. CT chest/abd show extensive lymphadenopathy and a cavitary lesion in LLL. CD4 < 35 in Dec. Cryptococcus tier was positive but low. MAC serum test was positive. He is also leukopenic with no lymphocytes count.   Overall patient's prognosis is poor due to AIDS and medication noncompliance.  He seems to have very poor understanding of his medical conditions.  Patient has an appointment with ID on 2/13 which we encouraged him to go.  Will consider restarting antiviral therapy vs outpatient palliative consult at follow-up.  No medication added at this time.  Plan: Patient can be discharged with no new medications Follow-up with ID on Monday, 03/18/2021  Principal Problem:   AIDS (acquired immune deficiency syndrome) (HCC) Active Problems:   Cryptococcal meningitis (HCC)   Mycobacterium avium complex ([place])   Scheduled Meds: Continuous Infusions: PRN Meds:.  HPI: Ricky [name] is a 66 y.o. male with PMH of HIV, AIDS, TB, cryptococcus meningitis, MAC, substance use disorder who present to the ED for diffuse pain of the abdomen, back and chest. He was admitted in December and was started on Biktarvy, Bactrim and fluconazole. He unfortunately has not been taking his antiviral medication for a few years.   Patient is seen at bedside.  He appears comfortable.  Michela Pitcher that he has been taking his medication on and off after leaving the hospital in December.  He is not pleased with where he stays currently.  Review of Systems: per HPI  Past Medical History:  Diagnosis Date   Human immunodeficiency virus (HIV) disease ([place]) 03/26/2020    Social  History   Tobacco Use   Smoking status: Former    Types: Cigarettes   Smokeless tobacco: Former  Substance Use Topics   Alcohol use: Yes    Alcohol/week: 14.0 - 21.0 standard drinks    Types: 14 - 21 Cans of beer per week   Drug use: Not Currently    Family History  Problem Relation Age of Onset   Hypertension Mother    Kidney disease Mother    Hypertension Sister    Hypertension Brother    Allergies  Allergen Reactions   Bactrim [Sulfamethoxazole-Trimethoprim] Other (See Comments)    Patient was trialed on DS TIW and SS daily for PJP prophylaxis and developed hyperkalemia and increased Scr    OBJECTIVE: Blood pressure 123/84, pulse 74, temperature (!) 97.4 F (36.3 C), temperature source Oral, resp. rate 17, height _0  (1.854 m), SpO2 100 %.  Physical Exam Constitutional:      General: He is not in acute distress. HENT:     Head: Normocephalic.  Eyes:     General:        Right eye: No discharge.        Left eye: No discharge.     Conjunctiva/sclera: Conjunctivae normal.  Pulmonary:     Effort: Pulmonary effort is normal. No respiratory distress.     Comments: Few coughs noted Musculoskeletal:        General: Normal range of motion.  Neurological:     General: No focal deficit present.     Mental Status:  He is alert.  Psychiatric:        Mood and Affect: Mood normal.        Behavior: Behavior normal.    Lab Results Lab Results  Component Value Date   WBC 3.0 (L) 03/15/2021   HGB 8.4 (L) 03/15/2021   HCT 24.3 (L) 03/15/2021   MCV 97.2 03/15/2021   PLT 175 03/15/2021    Lab Results  Component Value Date   CREATININE 0.89 03/15/2021   BUN 7 (L) 03/15/2021   NA 129 (L) 03/15/2021   K 3.4 (L) 03/15/2021   CL 97 (L) 03/15/2021   CO2 22 03/15/2021    Lab Results  Component Value Date   ALT 15 03/15/2021   AST 65 (H) 03/15/2021   ALKPHOS 352 (H) 03/15/2021   BILITOT 0.7 03/15/2021     Microbiology: No results found for this or any previous  visit (from the past 240 hour(s)).  Gaylan Gerold, MD [facility]  03/15/2021, 9:33 AM

## 2021-03-15 NOTE — ED Triage Notes (Signed)
Per ems, pt reports pain all over for 1 month, specifically in his back. Pt reports multiple complaints. Pt admits to etoh use yesterdat, drank a 40oz. Pt A&O4 and ambulatory. "Pt reports I feel like I'm dying." VSS.

## 2021-03-15 NOTE — ED Provider Notes (Signed)
[facility] EMERGENCY DEPARTMENT Provider Note   CSN: [id number] Arrival date & time: 03/15/21  0422     History  Chief Complaint  Patient presents with   Pain    Ricky [name] is a 66 y.o. male.  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  Patient comes in with diffuse pains in his abdomen, back, chest.  No actual headaches but he states he does have weight loss and decreased appetite.  States he moved over couple years ago and has not had an ID doctor since then but he has been hospitalized a couple times where infectious disease has seen him and made recommendations.  It seems that the patient has been noncompliant with these.  I did speak with one of the infectious disease doctors who stated he has an appointment on the 13th.       Home Medications Prior to Admission medications   Medication Sig Start Date End Date Taking? Authorizing Provider  acetaminophen (TYLENOL) 500 MG tablet Take 500 mg by mouth every 6 (six) hours as needed for moderate pain or headache.    [provider]  bictegravir-emtricitabine-tenofovir AF (BIKTARVY) 50-200-25 MG TABS tablet Take 1 tablet by mouth daily. 01/23/21   Vu, Johnny Bridge T, MD  fluconazole (DIFLUCAN) 200 MG tablet Take 2 tablets (400 mg total) by mouth daily. 01/22/21   Vu, Rockey Situ, MD  fluticasone (FLONASE) 50 MCG/ACT nasal spray Place 1 spray into both nostrils as needed for allergies or rhinitis. Patient not taking: Reported on 01/23/2021 10/26/20 12/25/20  Donney Dice, DO  folic acid (FOLVITE) 1 MG tablet Take 1 tablet (1 mg total) by mouth daily. 01/25/21   Mercy Riding, MD  Multiple Vitamin (MULTIVITAMIN WITH MINERALS) TABS tablet Take 1 tablet by mouth daily. 01/25/21   Mercy Riding, MD  nicotine (NICODERM CQ - DOSED IN MG/24 HOURS) 14 mg/24hr patch Place 1 patch (14 mg total) onto the  skin daily. 01/25/21   Mercy Riding, MD  ondansetron (ZOFRAN) 4 MG tablet Take 1 tablet (4 mg total) by mouth every 6 (six) hours as needed for nausea. 01/24/21   Mercy Riding, MD  pantoprazole (PROTONIX) 40 MG tablet Take 1 tablet (40 mg total) by mouth daily. 01/24/21 03/25/21  Mercy Riding, MD  pantoprazole (PROTONIX) 40 MG tablet Take 1 tablet (40 mg total) by mouth daily. 02/16/21   Ripley Fraise, MD  polyethylene glycol powder (MIRALAX) 17 GM/SCOOP powder Take 17 g by mouth 2 (two) times daily as needed for moderate constipation. 01/24/21   Mercy Riding, MD  sulfamethoxazole-trimethoprim (BACTRIM) 400-80 MG tablet Take 1 tablet by mouth daily. 01/23/21   Vu, Johnny Bridge T, MD  thiamine 100 MG tablet Take 1 tablet (100 mg total) by mouth daily. 01/25/21 07/24/21  Mercy Riding, MD      Allergies    Bactrim [sulfamethoxazole-trimethoprim]    Review of Systems   Review of Systems  Physical Exam Updated Vital Signs BP (!) 164/88    Pulse 87    Temp (!) 97.4 F (36.3 C) (Oral)    Resp 18    Ht 6\' 1"  (1.854 m)    SpO2 100%    BMI 22.43 kg/m  Physical Exam Vitals and nursing note reviewed.  Constitutional:      Appearance: He is well-developed.  HENT:  Head: Normocephalic and atraumatic.  Eyes:     Pupils: Pupils are equal, round, and reactive to light.  Cardiovascular:     Rate and Rhythm: Normal rate.  Pulmonary:     Effort: Pulmonary effort is normal. No respiratory distress.  Abdominal:     General: There is no distension.     Tenderness: There is abdominal tenderness.  Musculoskeletal:        General: No swelling. Normal range of motion.     Cervical back: Normal range of motion.  Skin:    General: Skin is warm and dry.     Coloration: Skin is not jaundiced or pale.  Neurological:     General: No focal deficit present.     Mental Status: He is alert.    ED Results / Procedures / Treatments   Labs (all labs ordered are listed, but only abnormal results are  displayed) Labs Reviewed  CBC WITH DIFFERENTIAL/PLATELET - Abnormal; Notable for the following components:      Result Value   WBC 3.0 (*)    RBC 2.50 (*)    Hemoglobin 8.4 (*)    HCT 24.3 (*)    RDW 15.9 (*)    Lymphs Abs 0.0 (*)    All other components within normal limits  COMPREHENSIVE METABOLIC PANEL - Abnormal; Notable for the following components:   Sodium 129 (*)    Potassium 3.4 (*)    Chloride 97 (*)    Glucose, Bld 101 (*)    BUN 7 (*)    Calcium 7.5 (*)    Total Protein 6.3 (*)    Albumin <1.5 (*)    AST 65 (*)    Alkaline Phosphatase 352 (*)    All other components within normal limits  LIPASE, BLOOD  URINALYSIS, ROUTINE W REFLEX MICROSCOPIC  POC OCCULT BLOOD, ED    EKG None  Radiology No results found.  Procedures Procedures    Medications Ordered in ED Medications  fentaNYL (SUBLIMAZE) injection 50 mcg (50 mcg Intravenous Given 03/15/21 0541)  alum & mag hydroxide-simeth (MAALOX/MYLANTA) 200-200-20 MG/5ML suspension 30 mL (30 mLs Oral Given 03/15/21 0541)    And  lidocaine (XYLOCAINE) 2 % viscous mouth solution 15 mL (15 mLs Oral Given 03/15/21 0541)  lactated ringers bolus 1,000 mL (1,000 mLs Intravenous New Bag/Given 03/15/21 0540)  pantoprazole (PROTONIX) injection 40 mg (40 mg Intravenous Given 03/15/21 0540)  iohexol (OMNIPAQUE) 300 MG/ML solution 80 mL (80 mLs Intravenous Contrast Given 03/15/21 0656)    ED Course/ Medical Decision Making/ A&P                           Medical Decision Making Amount and/or Complexity of Data Reviewed Labs: ordered. Radiology: ordered.  Risk OTC drugs. Prescription drug management.   66 year old male noncompliant with medications here with almost no lymphocytes concerning for probable AIDS.  No obvious infections on exam but has multiple significant abnormalities on CT scan and labs.  Hemoccult negative.  Multiple discussions with Dr. West Bali and she would like to have the day team see the patient in the  emergency room.  She stated she would contact them for consultation and they will determine the best disposition for the patient. Care transferred pending same.   Final Clinical Impression(s) / ED Diagnoses Final diagnoses:  AIDS (acquired immune deficiency syndrome) ([place])    Rx / DC Orders ED Discharge Orders     None  Toniette Devera, Corene Cornea, MD 03/15/21 [phone]

## 2021-03-18 ENCOUNTER — Ambulatory Visit: Payer: Medicaid Other | Admitting: Pharmacist

## 2021-03-18 ENCOUNTER — Ambulatory Visit: Payer: Medicaid Other | Admitting: Infectious Disease

## 2021-03-18 DIAGNOSIS — A310 Pulmonary mycobacterial infection: Secondary | ICD-10-CM

## 2021-03-18 DIAGNOSIS — F1010 Alcohol abuse, uncomplicated: Secondary | ICD-10-CM

## 2021-03-18 DIAGNOSIS — I4892 Unspecified atrial flutter: Secondary | ICD-10-CM

## 2021-03-18 DIAGNOSIS — B451 Cerebral cryptococcosis: Secondary | ICD-10-CM

## 2021-03-18 DIAGNOSIS — B20 Human immunodeficiency virus [HIV] disease: Secondary | ICD-10-CM

## 2021-03-18 DIAGNOSIS — F1490 Cocaine use, unspecified, uncomplicated: Secondary | ICD-10-CM

## 2021-03-18 DIAGNOSIS — Z91199 Patient's noncompliance with other medical treatment and regimen due to unspecified reason: Secondary | ICD-10-CM

## 2021-03-23 ENCOUNTER — Other Ambulatory Visit: Payer: Self-pay

## 2021-03-23 ENCOUNTER — Emergency Department (HOSPITAL_COMMUNITY)
Admission: EM | Admit: 2021-03-23 | Discharge: 2021-03-24 | Disposition: A | Discharge status: Home / Self Care | Payer: Medicaid Other | Attending: Emergency Medicine | Admitting: Emergency Medicine

## 2021-03-23 DIAGNOSIS — R109 Unspecified abdominal pain: Secondary | ICD-10-CM | POA: Diagnosis present

## 2021-03-23 DIAGNOSIS — B20 Human immunodeficiency virus [HIV] disease: Secondary | ICD-10-CM | POA: Insufficient documentation

## 2021-03-23 DIAGNOSIS — R1013 Epigastric pain: Secondary | ICD-10-CM

## 2021-03-23 LAB — URINALYSIS, ROUTINE W REFLEX MICROSCOPIC
Bilirubin Urine: NEGATIVE
Glucose, UA: NEGATIVE mg/dL
Hgb urine dipstick: NEGATIVE
Ketones, ur: NEGATIVE mg/dL
Nitrite: POSITIVE — AB
Protein, ur: NEGATIVE mg/dL
Specific Gravity, Urine: 1.01 (ref 1.005–1.030)
pH: 6 (ref 5.0–8.0)

## 2021-03-23 LAB — CBC
HCT: 27.4 % — ABNORMAL LOW (ref 39.0–52.0)
Hemoglobin: 9.6 g/dL — ABNORMAL LOW (ref 13.0–17.0)
MCH: 34.7 pg — ABNORMAL HIGH (ref 26.0–34.0)
MCHC: 35 g/dL (ref 30.0–36.0)
MCV: 98.9 fL (ref 80.0–100.0)
Platelets: 207 10*3/uL (ref 150–400)
RBC: 2.77 MIL/uL — ABNORMAL LOW (ref 4.22–5.81)
RDW: 15.4 % (ref 11.5–15.5)
WBC: 3.6 10*3/uL — ABNORMAL LOW (ref 4.0–10.5)
nRBC: 0 % (ref 0.0–0.2)

## 2021-03-23 LAB — COMPREHENSIVE METABOLIC PANEL
ALT: 19 U/L (ref 0–44)
AST: 69 U/L — ABNORMAL HIGH (ref 15–41)
Albumin: <1.5 g/dL — ABNORMAL LOW (ref 3.5–5.0)
Alkaline Phosphatase: 419 U/L — ABNORMAL HIGH (ref 38–126)
Anion gap: 8 (ref 5–15)
BUN: 5 mg/dL — ABNORMAL LOW (ref 8–23)
CO2: 22 mmol/L (ref 22–32)
Calcium: 7.7 mg/dL — ABNORMAL LOW (ref 8.9–10.3)
Chloride: 96 mmol/L — ABNORMAL LOW (ref 98–111)
Creatinine, Ser: 0.69 mg/dL (ref 0.61–1.24)
GFR, Estimated: >60 mL/min (ref >60)
Glucose, Bld: 89 mg/dL (ref 70–99)
Potassium: 3.5 mmol/L (ref 3.5–5.1)
Sodium: 126 mmol/L — ABNORMAL LOW (ref 135–145)
Total Bilirubin: 0.6 mg/dL (ref 0.3–1.2)
Total Protein: 7.4 g/dL (ref 6.5–8.1)

## 2021-03-23 LAB — LIPASE, BLOOD: Lipase: 25 U/L (ref 11–51)

## 2021-03-23 MED ORDER — PANTOPRAZOLE SODIUM 20 MG PO TBEC: 20.0000 mg | DELAYED_RELEASE_TABLET | Freq: Every day | ORAL | 0 refills | Status: DC

## 2021-03-23 MED ORDER — ONDANSETRON HCL 4 MG/2ML IJ SOLN
4.0000 mg | Freq: Once | INTRAMUSCULAR | Status: AC
  Administered 2021-03-23: 4 mg via INTRAVENOUS
  Filled 2021-03-23: qty 2

## 2021-03-23 MED ORDER — FENTANYL CITRATE PF 50 MCG/ML IJ SOSY
100.0000 ug | PREFILLED_SYRINGE | Freq: Once | INTRAMUSCULAR | Status: AC
  Administered 2021-03-23: 100 ug via INTRAVENOUS
  Filled 2021-03-23: qty 2

## 2021-03-23 MED ORDER — ONDANSETRON 4 MG PO TBDP: 4.0000 mg | ORAL_TABLET | Freq: Three times a day (TID) | ORAL | 0 refills | Status: DC | PRN

## 2021-03-23 MED ORDER — ALUM & MAG HYDROXIDE-SIMETH 200-200-20 MG/5ML PO SUSP
30.0000 mL | Freq: Once | ORAL | Status: AC
  Administered 2021-03-23: 30 mL via ORAL
  Filled 2021-03-23: qty 30

## 2021-03-23 MED ORDER — LIDOCAINE VISCOUS HCL 2 % MT SOLN
15.0000 mL | Freq: Once | OROMUCOSAL | Status: AC
  Administered 2021-03-23: 15 mL via ORAL
  Filled 2021-03-23: qty 15

## 2021-03-23 MED ORDER — SODIUM CHLORIDE 0.9 % IV BOLUS
1000.0000 mL | Freq: Once | INTRAVENOUS | Status: AC
  Administered 2021-03-23: 1000 mL via INTRAVENOUS

## 2021-03-23 NOTE — ED Provider Notes (Signed)
[facility] EMERGENCY DEPARTMENT Provider Note   CSN: [id number] Arrival date & time: 03/23/21  2046     History  Chief Complaint  Patient presents with   Abdominal Pain    Ricky [name] is a 66 y.o. male.  Patient with history of AIDS, non-adherence to his antiviral, history of TB, cryptococcus meningitis, MAC, and substance use disorder --presents to the emergency department for evaluation of abdominal pain and vomiting.  Patient was seen in the emergency department most recently on 03/15/2021.  At that time CT chest/abd showed extensive lymphadenopathy and a cavitary lesion in LLL. CD4 < 35 in Dec. He was also leukopenic with no lymphocyte count.  He was evaluated by infectious disease in the emergency department and discharged home.  Patient was strongly encouraged to keep his follow-up appointment with infectious diseases on 2/13.  Patient did not follow-up stating that he did not have transportation.  Patient reports pain that has recently moved from the mid abdomen to the left upper quadrant "in my pancreas".  He reports 2 episodes of vomiting today.  No fevers, chest pain or shortness of breath.  No urinary symptoms.      Home Medications Prior to Admission medications   Medication Sig Start Date End Date Taking? Authorizing Provider  acetaminophen (TYLENOL) 500 MG tablet Take 500 mg by mouth every 6 (six) hours as needed for moderate pain or headache.    [provider]  bictegravir-emtricitabine-tenofovir AF (BIKTARVY) 50-200-25 MG TABS tablet Take 1 tablet by mouth daily. 01/23/21   Vu, Johnny Bridge T, MD  fluconazole (DIFLUCAN) 200 MG tablet Take 2 tablets (400 mg total) by mouth daily. 01/22/21   Vu, Rockey Situ, MD  fluticasone (FLONASE) 50 MCG/ACT nasal spray Place 1 spray into both nostrils as needed for allergies or rhinitis. Patient not taking: Reported on 01/23/2021 10/26/20 12/25/20  Donney Dice, DO  folic acid (FOLVITE) 1 MG tablet Take 1 tablet (1  mg total) by mouth daily. 01/25/21   Mercy Riding, MD  Multiple Vitamin (MULTIVITAMIN WITH MINERALS) TABS tablet Take 1 tablet by mouth daily. 01/25/21   Mercy Riding, MD  nicotine (NICODERM CQ - DOSED IN MG/24 HOURS) 14 mg/24hr patch Place 1 patch (14 mg total) onto the skin daily. 01/25/21   Mercy Riding, MD  ondansetron (ZOFRAN) 4 MG tablet Take 1 tablet (4 mg total) by mouth every 6 (six) hours as needed for nausea. 01/24/21   Mercy Riding, MD  pantoprazole (PROTONIX) 40 MG tablet Take 1 tablet (40 mg total) by mouth daily. 01/24/21 03/25/21  Mercy Riding, MD  pantoprazole (PROTONIX) 40 MG tablet Take 1 tablet (40 mg total) by mouth daily. 02/16/21   Ripley Fraise, MD  polyethylene glycol powder (MIRALAX) 17 GM/SCOOP powder Take 17 g by mouth 2 (two) times daily as needed for moderate constipation. 01/24/21   Mercy Riding, MD  sulfamethoxazole-trimethoprim (BACTRIM) 400-80 MG tablet Take 1 tablet by mouth daily. 01/23/21   Vu, Johnny Bridge T, MD  thiamine 100 MG tablet Take 1 tablet (100 mg total) by mouth daily. 01/25/21 07/24/21  Mercy Riding, MD      Allergies    Bactrim [sulfamethoxazole-trimethoprim]    Review of Systems   Review of Systems  Physical Exam Updated Vital Signs BP (!) 122/92    Pulse 72    Temp (!) 97.4 F (36.3 C) (Oral)    Resp 18    Ht _0  (1.854 m)  Wt 77.1 kg    SpO2 100%    BMI 22.43 kg/m  Physical Exam Vitals and nursing note reviewed.  Constitutional:      General: He is not in acute distress.    Appearance: He is well-developed.  HENT:     Head: Normocephalic and atraumatic.     Mouth/Throat:     Mouth: Mucous membranes are moist.     Comments: Mucosa appears moist, no obvious intraoral lesions. Eyes:     General:        Right eye: No discharge.        Left eye: No discharge.     Conjunctiva/sclera: Conjunctivae normal.  Cardiovascular:     Rate and Rhythm: Normal rate and regular rhythm.     Heart sounds: Normal heart sounds.  Pulmonary:      Effort: Pulmonary effort is normal.     Breath sounds: Normal breath sounds.  Abdominal:     Palpations: Abdomen is soft.     Tenderness: There is abdominal tenderness in the epigastric area, periumbilical area and left upper quadrant.  Musculoskeletal:     Cervical back: Normal range of motion and neck supple.  Skin:    General: Skin is warm and dry.  Neurological:     Mental Status: He is alert.    ED Results / Procedures / Treatments   Labs (all labs ordered are listed, but only abnormal results are displayed) Labs Reviewed  COMPREHENSIVE METABOLIC PANEL - Abnormal; Notable for the following components:      Result Value   Sodium 126 (*)    Chloride 96 (*)    BUN 5 (*)    Calcium 7.7 (*)    Albumin <1.5 (*)    AST 69 (*)    Alkaline Phosphatase 419 (*)    All other components within normal limits  CBC - Abnormal; Notable for the following components:   WBC 3.6 (*)    RBC 2.77 (*)    Hemoglobin 9.6 (*)    HCT 27.4 (*)    MCH 34.7 (*)    All other components within normal limits  URINALYSIS, ROUTINE W REFLEX MICROSCOPIC - Abnormal; Notable for the following components:   Nitrite POSITIVE (*)    Leukocytes,Ua MODERATE (*)    Bacteria, UA MANY (*)    All other components within normal limits  LIPASE, BLOOD    EKG None  Radiology No results found.  Procedures Procedures    Medications Ordered in ED Medications  sodium chloride 0.9 % bolus 1,000 mL (has no administration in time range)  ondansetron (ZOFRAN) injection 4 mg (has no administration in time range)  fentaNYL (SUBLIMAZE) injection 100 mcg (has no administration in time range)  alum & mag hydroxide-simeth (MAALOX/MYLANTA) 200-200-20 MG/5ML suspension 30 mL (has no administration in time range)    And  lidocaine (XYLOCAINE) 2 % viscous mouth solution 15 mL (has no administration in time range)    ED Course/ Medical Decision Making/ A&P    Patient seen and examined. History obtained directly from  patient.  Reviewed previous ED work-up notes and infectious disease evaluation, hospital admission in December 2022.  Work-up including labs, imaging, EKG ordered in triage, if performed, were reviewed.    Labs/EKG: Independently reviewed and interpreted.  This included: CBC showing chronic leukopenia and lymphopenia, chronic anemia; CMP with chronic hyponatremia, normal creatinine, chronic mild elevation in AST and elevation in alk phos, chronically low albumin; normal lipase; UA with bacteria, 21-50 white blood  cells.  Imaging: None ordered.  Considered additional imaging, repeat imaging but patient has had a scan in the past 8 days.  Medications/Fluids: Ordered: IV fluid bolus, GI cocktail, IV Zofran and fentanyl x1 dose.   Most recent vital signs reviewed and are as follows: BP (!) 122/92    Pulse 72    Temp (!) 97.4 F (36.3 C) (Oral)    Resp 18    Ht _0  (1.854 m)    Wt 77.1 kg    SpO2 100%    BMI 22.43 kg/m   Initial impression: Abdominal pain in setting of AIDS, poor compliance with medications and follow-up.  Lab work today is consistent with the patient's recent labs without any notable changes.  We will treat symptoms with IV fluids, nausea medication, GI cocktail, pain medication.  Discussed with Dr. Laverta Baltimore who will see.  Likely will discharge to home.  11:19 PM Reassessment performed. Patient appears stable.  Patient discussed with ENT and by Dr. Laverta Baltimore.  We will send urine for culture, no indication for repeat imaging at this time.  Plan for discharge to home.  Most current vital signs reviewed and are as follows: BP (!) 122/92    Pulse 72    Temp (!) 97.4 F (36.3 C) (Oral)    Resp 18    Ht _1  (1.854 m)    Wt 77.1 kg    SpO2 100%    BMI 22.43 kg/m   Strongly encouraged follow-up with PCP and infectious diseases.  The patient was urged to return to the Emergency Department immediately with worsening of current symptoms, worsening abdominal pain, persistent vomiting, blood noted  in stools, fever, or any other concerns. The patient verbalized understanding.                           Medical Decision Making Amount and/or Complexity of Data Reviewed Labs: ordered.  Risk OTC drugs. Prescription drug management.   For this patient's complaint of abdominal pain, the following conditions were considered on the differential diagnosis: gastritis/PUD, enteritis/duodenitis, appendicitis, cholelithiasis/cholecystitis, cholangitis, pancreatitis, ruptured viscus, colitis, diverticulitis, proctitis, cystitis, pyelonephritis, ureteral colic, aortic dissection, aortic aneurysm. In women, ectopic pregnancy, pelvic inflammatory disease, ovarian cysts, and tubo-ovarian abscess were also considered. Atypical chest etiologies were also considered including ACS, PE, and pneumonia.   Patient's labs reviewed as above, are near baseline.  Possible gastritis given history of alcohol abuse.  No concern for bleeding, free air, sepsis at this point.  Patient given symptomatic care in ED.  He is admittedly high risk for return to the ED or progression of illness given history of AIDS with noncompliance.  Patient again encouraged to follow-up.  The patient's vital signs, pertinent lab work and imaging were reviewed and interpreted as discussed in the ED course. Hospitalization was considered for further testing, treatments, or serial exams/observation. However as patient is well-appearing, has a stable exam, and reassuring studies today, I do not feel that they warrant admission at this time. This plan was discussed with the patient who verbalizes agreement and comfort with this plan and seems reliable and able to return to the Emergency Department with worsening or changing symptoms.          Final Clinical Impression(s) / ED Diagnoses Final diagnoses:  Epigastric pain  AIDS (acquired immune deficiency syndrome) ([place])    Rx / DC Orders ED Discharge Orders          Ordered  ondansetron  (ZOFRAN-ODT) 4 MG disintegrating tablet  Every 8 hours PRN        03/23/21 2321    pantoprazole (PROTONIX) 20 MG tablet  Daily        03/23/21 2321              Carlisle Cater, PA-C 03/23/21 2324    Margette Fast, MD 04/04/21 1214

## 2021-03-23 NOTE — ED Triage Notes (Signed)
Pt brought in by EMS c/o abd pain for the past week. Pt reports nausea and vomiting.

## 2021-03-26 LAB — URINE CULTURE: Culture: >=100000 — AB

## 2021-03-27 ENCOUNTER — Telehealth: Payer: Self-pay | Admitting: Emergency Medicine

## 2021-03-27 NOTE — Telephone Encounter (Signed)
Post ED Visit - Positive Culture Follow-up: Successful Patient Follow-Up  Culture assessed and recommendations reviewed by:  []  Elenor Quinones, Pharm.D. []  Heide Guile, Pharm.D., BCPS AQ-ID []  Parks Neptune, Pharm.D., BCPS []  Alycia Rossetti, Pharm.D., BCPS []  [place], Pharm.D., BCPS, AAHIVP []  Legrand Como, Pharm.D., BCPS, AAHIVP []  Salome Arnt, PharmD, BCPS []  Johnnette Gourd, PharmD, BCPS []  Hughes Better, PharmD, BCPS []  Leeroy Cha, PharmD  Positive urine culture  [x]  Patient discharged without antimicrobial prescription and treatment is now indicated []  Organism is resistant to prescribed ED discharge antimicrobial []  Patient with positive blood cultures  Changes discussed with ED provider: Dr Isla Pence New antibiotic prescription start Keflex 500mg  po q 6 hours x 7 days Called to [facility] [phone]  Contacted patient   Ricky [name] 03/27/2021, 11:13 AM

## 2021-03-27 NOTE — Progress Notes (Signed)
ED Antimicrobial Stewardship Positive Culture Follow Up   Ricky [name] is an 66 y.o. male who presented to [facility] on 03/23/2021 with a chief complaint of  Chief Complaint  Patient presents with   Abdominal Pain    Recent Results (from the past 720 hour(s))  Urine Culture     Status: Abnormal   Collection Time: 03/23/21  2:53 PM   Specimen: Urine, Clean Catch  Result Value Ref Range Status   Specimen Description URINE, CLEAN CATCH  Final   Special Requests   Final    NONE Performed at [facility], [address] [place], [place] [address]    Culture >=100,000 COLONIES/mL KLEBSIELLA PNEUMONIAE (A)  Final   Report Status 03/26/2021 FINAL  Final   Organism ID, Bacteria KLEBSIELLA PNEUMONIAE (A)  Final      Susceptibility   Klebsiella pneumoniae - MIC*    AMPICILLIN >=32 RESISTANT Resistant     CEFAZOLIN <=4 SENSITIVE Sensitive     CEFEPIME <=0.12 SENSITIVE Sensitive     CEFTRIAXONE <=0.25 SENSITIVE Sensitive     CIPROFLOXACIN <=0.25 SENSITIVE Sensitive     GENTAMICIN <=1 SENSITIVE Sensitive     IMIPENEM <=0.25 SENSITIVE Sensitive     NITROFURANTOIN 32 SENSITIVE Sensitive     TRIMETH/SULFA >=320 RESISTANT Resistant     AMPICILLIN/SULBACTAM 4 SENSITIVE Sensitive     PIP/TAZO <=4 SENSITIVE Sensitive     * >=100,000 COLONIES/mL KLEBSIELLA PNEUMONIAE    [x]  Patient discharged originally without antimicrobial agent and treatment is now indicated  New antibiotic prescription: Start keflex 500mg  by mouth every 6 hrs for 7 days. Ensure patient attends ID clinic appointment on 04/04/21.  ED Provider: Isla Pence, MD   Cristela Felt, PharmD, BCPS Clinical Pharmacist Monday - Friday phone -  [phone] Saturday - Sunday phone - [phone]

## 2021-03-29 ENCOUNTER — Ambulatory Visit (HOSPITAL_COMMUNITY)
Admission: EM | Admit: 2021-03-29 | Discharge: 2021-03-29 | Disposition: A | Discharge status: Home / Self Care | Payer: Medicaid Other | Attending: Physician Assistant | Admitting: Physician Assistant

## 2021-03-29 ENCOUNTER — Encounter (HOSPITAL_COMMUNITY): Payer: Self-pay | Admitting: Emergency Medicine

## 2021-03-29 ENCOUNTER — Ambulatory Visit: Payer: Medicaid Other | Admitting: Cardiology

## 2021-03-29 ENCOUNTER — Other Ambulatory Visit: Payer: Self-pay

## 2021-03-29 DIAGNOSIS — R591 Generalized enlarged lymph nodes: Secondary | ICD-10-CM | POA: Insufficient documentation

## 2021-03-29 DIAGNOSIS — N390 Urinary tract infection, site not specified: Secondary | ICD-10-CM | POA: Diagnosis present

## 2021-03-29 DIAGNOSIS — B20 Human immunodeficiency virus [HIV] disease: Secondary | ICD-10-CM | POA: Diagnosis not present

## 2021-03-29 DIAGNOSIS — R9389 Abnormal findings on diagnostic imaging of other specified body structures: Secondary | ICD-10-CM | POA: Diagnosis not present

## 2021-03-29 LAB — POCT URINALYSIS DIPSTICK, ED / UC
Glucose, UA: NEGATIVE mg/dL
Hgb urine dipstick: NEGATIVE
Nitrite: NEGATIVE
Protein, ur: NEGATIVE mg/dL
Specific Gravity, Urine: 1.015 (ref 1.005–1.030)
Urobilinogen, UA: 2 mg/dL — ABNORMAL HIGH (ref 0.0–1.0)
pH: 6 (ref 5.0–8.0)

## 2021-03-29 MED ORDER — HYDROCODONE-ACETAMINOPHEN 5-325 MG PO TABS: 1.0000 | ORAL_TABLET | Freq: Every evening | ORAL | 0 refills | Status: AC | PRN

## 2021-03-29 MED ORDER — CIPROFLOXACIN HCL 500 MG PO TABS: 500.0000 mg | ORAL_TABLET | Freq: Two times a day (BID) | ORAL | 0 refills | Status: DC

## 2021-03-29 NOTE — ED Triage Notes (Signed)
Pt presents with on going right shoulder pain and lower back pain that radiates into both legs. States use to see pain management for the lower back pain. Pt advised UC does not do pain management. Currently treating with naproxen.

## 2021-03-29 NOTE — Discharge Instructions (Signed)
Start ciprofloxacin twice daily.  I will contact you if need to change antibiotic based on your culture results.  [facility] to make sure you have a follow-up appointment scheduled.  You also need to call urology to schedule an appointment with them.  As we discussed, it is very important that you follow-up with your primary care provider to investigate the abnormal CT scan that you had in the emergency room.  If you have any worsening symptoms you must go to the ER immediately as we discussed.

## 2021-03-29 NOTE — ED Provider Notes (Signed)
[facility]    CSN: [id number] Arrival date & time: 03/29/21  1133      History   Chief Complaint Chief Complaint  Patient presents with   Abdominal Pain    APPT   Flank Pain    HPI Ricky [name] is a 66 y.o. male.   Patient presents today with a prolonged history of abdominal pain.  He has been seen in the emergency room on several instances including 03/15/2021 and 03/23/2021.  During his visit on 03/15/2021 CT scan was obtained that showed extensive abnormalities including widespread lymphadenopathy concerning for lymphoma.  Patient has a history of HIV and is not currently taking antiviral medication on regular basis.  He has been evaluated by infectious disease during emergency room visits and encouraged to follow-up outpatient but missed appointments due to transportation issues.  His last helper T cell level was less than 35 with corresponding elevated HIV quant in December 2022.  Patient denies any nausea, vomiting, difficulty passing stools.  He reports intense pain particularly in his lower abdomen/groin.  During his last ER visit on 03/23/2021 urine culture was positive but he has not yet started on any antibiotics.  He denies any fever.  He reports pain is miserable and he is having difficulty with daily activities as a result.   Past Medical History:  Diagnosis Date   Human immunodeficiency virus (HIV) disease ([place]) 03/26/2020    Patient Active Problem List   Diagnosis Date Noted   Fever    Acute cholecystitis 12/06/1115   Complicated UTI (urinary tract infection) 01/22/2021   Noncompliance 01/22/2021   Lymphadenopathy    Chronic low back pain    HIV infection ([place])    Community acquired pneumonia    Nonadherence to medical treatment    COVID-19 virus infection 10/13/2020   Alcohol abuse, daily use    COVID-19 10/12/2020   Mycobacterium avium complex (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 Authorizing Provider  ciprofloxacin (CIPRO) 500 MG tablet Take 1 tablet (500 mg total) by mouth every 12 (twelve) hours for 7 days. 03/29/21 04/05/21 Yes Reika Callanan, Derry Skill, PA-C  HYDROcodone-acetaminophen (NORCO/VICODIN) 5-325 MG tablet Take 1 tablet by mouth at bedtime as needed for up to 3 days. 03/29/21 04/01/21 Yes Elaine Middleton, Derry Skill, PA-C  acetaminophen (TYLENOL) 500 MG tablet Take 500 mg by mouth every 6 (six) hours as needed for moderate pain or headache.    [provider]  bictegravir-emtricitabine-tenofovir AF (BIKTARVY) 50-200-25 MG TABS  tablet Take 1 tablet by mouth daily. 01/23/21   Vu, Johnny Bridge T, MD  fluconazole (DIFLUCAN) 200 MG tablet Take 2 tablets (400 mg total) by mouth daily. 01/22/21   Vu, Rockey Situ, MD  fluticasone (FLONASE) 50 MCG/ACT nasal spray Place 1 spray into both nostrils as needed for allergies or rhinitis. Patient not taking: Reported on 01/23/2021 10/26/20 12/25/20  Donney Dice, DO  folic acid (FOLVITE) 1 MG tablet Take 1 tablet (1 mg total) by mouth daily. 01/25/21   Mercy Riding, MD  Multiple Vitamin  (MULTIVITAMIN WITH MINERALS) TABS tablet Take 1 tablet by mouth daily. 01/25/21   Mercy Riding, MD  nicotine (NICODERM CQ - DOSED IN MG/24 HOURS) 14 mg/24hr patch Place 1 patch (14 mg total) onto the skin daily. 01/25/21   Mercy Riding, MD  ondansetron (ZOFRAN-ODT) 4 MG disintegrating tablet Take 1 tablet (4 mg total) by mouth every 8 (eight) hours as needed for nausea or vomiting. 03/23/21   Carlisle Cater, PA-C  pantoprazole (PROTONIX) 20 MG tablet Take 1 tablet (20 mg total) by mouth daily. 03/23/21   Carlisle Cater, PA-C  polyethylene glycol powder (MIRALAX) 17 GM/SCOOP powder Take 17 g by mouth 2 (two) times daily as needed for moderate constipation. 01/24/21   Mercy Riding, MD  sulfamethoxazole-trimethoprim (BACTRIM) 400-80 MG tablet Take 1 tablet by mouth daily. 01/23/21   Vu, Johnny Bridge T, MD  thiamine 100 MG tablet Take 1 tablet (100 mg total) by mouth daily. 01/25/21 07/24/21  Mercy Riding, MD    Family History Family History  Problem Relation Age of Onset   Hypertension Mother    Kidney disease Mother    Hypertension Sister    Hypertension Brother     Social History Social History   Tobacco Use   Smoking status: Former    Types: Cigarettes   Smokeless tobacco: Former  Substance Use Topics   Alcohol use: Yes    Alcohol/week: 14.0 - 21.0 standard drinks    Types: 14 - 21 Cans of beer per week   Drug use: Not Currently     Allergies   Bactrim [sulfamethoxazole-trimethoprim]   Review of Systems Review of Systems  Constitutional:  Positive for activity change and fatigue. Negative for appetite change and fever.  Respiratory:  Negative for cough and shortness of breath.   Cardiovascular:  Negative for chest pain.  Gastrointestinal:  Positive for abdominal pain and nausea. Negative for blood in stool, diarrhea and vomiting.  Genitourinary:  Positive for dysuria. Negative for frequency and urgency.  Musculoskeletal:  Positive for back pain.    Physical Exam Triage Vital  Signs ED Triage Vitals  Enc Vitals Group     BP 03/29/21 1305 127/87     Pulse Rate 03/29/21 1305 70     Resp 03/29/21 1305 18     Temp 03/29/21 1305 97.6 F (36.4 C)     Temp Source 03/29/21 1305 Oral     SpO2 03/29/21 1305 99 %     Weight --      Height --      Head Circumference --      Peak Flow --      Pain Score 03/29/21 1304 10     Pain Loc --      Pain Edu? --      Excl. in [place]? --    No data found.  Updated Vital Signs BP 127/87 (BP Location: Right Arm)    Pulse 70  Temp 97.6 F (36.4 C) (Oral)    Resp 18    SpO2 99%   Visual Acuity Right Eye Distance:   Left Eye Distance:   Bilateral Distance:    Right Eye Near:   Left Eye Near:    Bilateral Near:     Physical Exam Vitals reviewed.  Constitutional:      General: He is awake.     Appearance: Normal appearance. He is well-developed. He is ill-appearing.     Comments: Appears older than stated age in no acute distress  HENT:     Head: Normocephalic and atraumatic.     Mouth/Throat:     Pharynx: Uvula midline. No oropharyngeal exudate or posterior oropharyngeal erythema.  Cardiovascular:     Rate and Rhythm: Normal rate and regular rhythm.     Heart sounds: Normal heart sounds, S1 normal and S2 normal. No murmur heard. Pulmonary:     Effort: Pulmonary effort is normal.     Breath sounds: Normal breath sounds. No stridor. No wheezing, rhonchi or rales.     Comments: Clear to auscultation bilaterally Abdominal:     General: Bowel sounds are normal.     Palpations: Abdomen is soft.     Tenderness: There is generalized abdominal tenderness.     Comments: Tenderness to palpation throughout abdomen.  Genitourinary:    Comments: Significant swelling and adenopathy noted right inguinal region. Lymphadenopathy:     Lower Body: Right inguinal adenopathy present. No left inguinal adenopathy.  Neurological:     Mental Status: He is alert.  Psychiatric:        Behavior: Behavior is cooperative.     UC  Treatments / Results  Labs (all labs ordered are listed, but only abnormal results are displayed) Labs Reviewed  POCT URINALYSIS DIPSTICK, ED / UC - Abnormal; Notable for the following components:      Result Value   Bilirubin Urine SMALL (*)    Ketones, ur TRACE (*)    Urobilinogen, UA 2.0 (*)    Leukocytes,Ua TRACE (*)    All other components within normal limits  URINE CULTURE    EKG   Radiology No results found.  Procedures Procedures (including critical care time)  Medications Ordered in UC Medications - No data to display  Initial Impression / Assessment and Plan / UC Course  I have reviewed the triage vital signs and the nursing notes.  Pertinent labs & imaging results that were available during my care of the patient were reviewed by me and considered in my medical decision making (see chart for details).     Discussed with and utility of going to the emergency room given severity of pain, however, patient declined this as the past 2 times he was there he was sent home.  Additional lab work was deferred.  Urine was repeated today that shows evidence of infection.  We will start antibiotics and patient was given prescription for ciprofloxacin.  Urine culture was obtained-results pending.  We discussed at length the importance of following up with infectious disease and he reported that he would go over to the clinic immediately following her visit to arrange follow-up and consider starting medication.  We also discussed that the abnormalities noted on his exam and on the previous CT scan are concerning for lymphoma which is a type of cancer and he would need to follow-up as soon as possible with primary care for referral to heme-onc and/or PET/CT.  Had nursing staff schedule an appointment with  him with PCP prior to leaving but there was nothing in the near future.  Also placed PCP assistance referral in the hopes of getting him seen more quickly.  Discussed that if symptoms  are not improving or if anything worsens he needs to go to the emergency room immediately for further evaluation and management.  Patient reported that pain was becoming unbearable and again discussed potential utility of going to the emergency room but he was not interested in this.  We will send 3 doses of hydrocodone.  Review of [place] controlled substance database shows no inappropriate refills.  Discussed that this is sedating and he should not drive or drink alcohol with taking it.  Discussed case with Dr. Eustace Moore who agreed with treatment plan.  Tried to impress upon patient the importance of follow-up and the potential implication of concerning findings.  All questions answered to patient satisfaction.  Final Clinical Impressions(s) / UC Diagnoses   Final diagnoses:  Urinary tract infection without hematuria, site unspecified  Lymphadenopathy  Symptomatic HIV infection ([place])  Abnormal CT scan     Discharge Instructions      Start ciprofloxacin twice daily.  I will contact you if need to change antibiotic based on your culture results.  Please go to [facility] infectious [facility] today to make sure you have a follow-up appointment scheduled.  You also need to call urology to schedule an appointment with them.  As we discussed, it is very important that you follow-up with your primary care provider to investigate the abnormal CT scan that you had in the emergency room.  If you have any worsening symptoms you must go to the ER immediately as we discussed.     ED Prescriptions     Medication Sig Dispense Auth. Provider   ciprofloxacin (CIPRO) 500 MG tablet Take 1 tablet (500 mg total) by mouth every 12 (twelve) hours for 7 days. 14 tablet Gina Leblond K, PA-C   HYDROcodone-acetaminophen (NORCO/VICODIN) 5-325 MG tablet Take 1 tablet by mouth at bedtime as needed for up to 3 days. 3 tablet Briahnna Harries K, PA-C      I have reviewed the PDMP during this encounter.    Terrilee Croak, PA-C 03/29/21 1434

## 2021-03-29 NOTE — ED Triage Notes (Signed)
Pt c/o abd and flank pains for a couple weeks. Reports been seen couple times this year at ED and reports that given medications but not helping with his pain. Pt believes that related to his hernia.

## 2021-03-31 LAB — URINE CULTURE: Culture: NO GROWTH

## 2021-04-01 ENCOUNTER — Ambulatory Visit: Payer: Medicaid Other | Admitting: Cardiology

## 2021-04-03 ENCOUNTER — Ambulatory Visit (INDEPENDENT_AMBULATORY_CARE_PROVIDER_SITE_OTHER): Payer: Medicaid Other | Admitting: Gastroenterology

## 2021-04-03 ENCOUNTER — Encounter: Payer: Self-pay | Admitting: Gastroenterology

## 2021-04-03 ENCOUNTER — Telehealth: Payer: Self-pay | Admitting: Physician Assistant

## 2021-04-03 VITALS — BP 110/70 | HR 120 | Ht 73.0 in | Wt 150.0 lb

## 2021-04-03 DIAGNOSIS — B20 Human immunodeficiency virus [HIV] disease: Secondary | ICD-10-CM | POA: Diagnosis not present

## 2021-04-03 DIAGNOSIS — R109 Unspecified abdominal pain: Secondary | ICD-10-CM

## 2021-04-03 DIAGNOSIS — R918 Other nonspecific abnormal finding of lung field: Secondary | ICD-10-CM

## 2021-04-03 NOTE — Progress Notes (Addendum)
? ?Referring Provider: No ref. provider found ?Primary Care Physician:  Pcp, No ? ?Reason for Consultation:  Abdominal pain ? ? ?IMPRESSION:  ?Abdominal pain in the setting of AIDS with poor adherence to both medications and follow-up. Abnormal CT worrisome for lymphoma and a cavitary lung lesion. Unfortunately, he self-referred to GI prior to evaluation of his worrisome CT findings. Elevated alk phos and mildly elevated AST may be related to lymphoma although no focal liver lesions seen on imaging.  ? ?Chronic pancreatitis on CT. Symptoms may be related, but, they are honestly very difficult to tease out during his visit today. Could consider a trial of pancreatic enzymes in the future. ? ?PLAN: ?Continue PPI therapy ?IR consult for biopsy of lymph nodes ?Referral to hematology/oncology ?Referral to ID to reestablish care ?Would consider endoscopic after evaluation of lymph nodes is complete ? ?HPI: Ricky [name] is a 66 y.o. male self-referred for abdominal pain.  The history is obtained through the patient and review of his electronic health record.  He has a history of AIDS with nonadherence to his antiviral therapy, history of TB, cryptococcus meningitis, back, and substance use disorder.  He has recently been under evaluation for a pulmonary cavitary lesion and concerns for lymphoma based on ED follow-up.  But, did not keep his appointments with ID for further evaluation. He self-referred because the abdominal pain is what is really bothering him the most.  ? ?He has had multiple visits to the emergency room for abdominal pain, including 4 over the last 6 weeks, most recently on 03/23/2021 and again 03/29/2021. He was seen in the emergency room for abdominal pain and vomiting 03/23/2021.  CBC showed chronic leukopenia and lymphopenia and chronic anemia.  Comprehensive metabolic panel showed chronic hyponatremia.  His ALT and alk phos were mildly elevated.  He was treated with Zofran and Protonix and was told to  follow-up with his primary care provider. ? ?He reports a 3 months history of abdominal pain. Today he knows that he has something wrong with his lymph nodes based on his ER visit. He describes a burning abdominal pain in the upper abdominal pain, lower abdominal pain, and flank pain. He has a poor appetite and has 60-80 pounds over the last year. When asked about his bowel habits his reply is "everything is crazy." He is most bothered by pain, noting that he is miserable and unable to do most things because of the pain.  ? ?He has had multiple abdominal images, most recently: ?- Abdominal ultrasound 01/21/2021: Mild gallbladder wall thickening and pericholecystic fluid ?- CT of the chest abdomen and pelvis 03/15/2021: Extensive lymphadenopathy in the chest and abdomen that has progressed compared to images from September 2022 concerning for malignancy such as lymphoma or leukemia, chronic atypical infectious process suspected in the lungs including a cavitary lesion in the left lower lobe, a new subtle area of hypoperfusion in the lateral aspect of the interpolar region of the right kidney, urinary bladder thickening, small amount of ascites, and chronic pancreatitis ? ?Labs 03/23/21: Na 126, BUN 5, crt 0.69, AST 69, ALT 19, alk phos 419, lipase 25, albumin <1.5 ? ? ?Past Medical History:  ?Diagnosis Date  ? Human immunodeficiency virus (HIV) disease ([place]) 03/26/2020  ? ? ?Past Surgical History:  ?Procedure Laterality Date  ? IR FL GUIDED LOC OF NEEDLE/CATH TIP FOR SPINAL INJECTION LT  10/17/2020  ? SVT ABLATION N/A 04/23/2020  ? Procedure: SVT ABLATION;  Surgeon: Evans Lance, MD;  Location:  [facility];  Service: Cardiovascular;  Laterality: N/A;  ? ? ? ?Current Outpatient Medications  ?Medication Sig Dispense Refill  ? acetaminophen (TYLENOL) 500 MG tablet Take 500 mg by mouth every 6 (six) hours as needed for moderate pain or headache.    ? bictegravir-emtricitabine-tenofovir AF (BIKTARVY) 50-200-25 MG  TABS tablet Take 1 tablet by mouth daily. 30 tablet 0  ? fluconazole (DIFLUCAN) 200 MG tablet Take 2 tablets (400 mg total) by mouth daily. 60 tablet 0  ? folic acid (FOLVITE) 1 MG tablet Take 1 tablet (1 mg total) by mouth daily. 90 tablet 0  ? Multiple Vitamin (MULTIVITAMIN WITH MINERALS) TABS tablet Take 1 tablet by mouth daily.    ? ondansetron (ZOFRAN-ODT) 4 MG disintegrating tablet Take 1 tablet (4 mg total) by mouth every 8 (eight) hours as needed for nausea or vomiting. 10 tablet 0  ? pantoprazole (PROTONIX) 20 MG tablet Take 1 tablet (20 mg total) by mouth daily. 30 tablet 0  ? fluticasone (FLONASE) 50 MCG/ACT nasal spray Place 1 spray into both nostrils as needed for allergies or rhinitis. (Patient not taking: Reported on 01/23/2021) 16 g 2  ? sulfamethoxazole-trimethoprim (BACTRIM) 400-80 MG tablet Take 1 tablet by mouth daily. (Patient not taking: Reported on 04/03/2021) 30 tablet 0  ? thiamine 100 MG tablet Take 1 tablet (100 mg total) by mouth daily. 90 tablet 1  ? ?No current facility-administered medications for this visit.  ? ? ?Allergies as of 04/03/2021 - Review Complete 04/03/2021  ?Allergen Reaction Noted  ? Bactrim [sulfamethoxazole-trimethoprim] Other (See Comments) 04/18/2020  ? ? ?Family History  ?Problem Relation Age of Onset  ? Hypertension Mother   ? Kidney disease Mother   ? Hypertension Sister   ? Hypertension Brother   ? Kidney cancer Brother   ? Colon cancer Neg Hx   ? Liver cancer Neg Hx   ? Pancreatic cancer Neg Hx   ? Esophageal cancer Neg Hx   ? ? ?Social History  ? ?Socioeconomic History  ? Marital status: Married  ?  Spouse name: Not on file  ? Number of children: Not on file  ? Years of education: Not on file  ? Highest education level: Not on file  ?Occupational History  ? Not on file  ?Tobacco Use  ? Smoking status: Former  ?  Types: Cigarettes  ? Smokeless tobacco: Former  ?Substance and Sexual Activity  ? Alcohol use: Yes  ?  Alcohol/week: 14.0 - 21.0 standard drinks  ?   Types: 14 - 21 Cans of beer per week  ? Drug use: Not Currently  ? Sexual activity: Not on file  ?Other Topics Concern  ? Not on file  ?Social History Narrative  ? Not on file  ? ?Social Determinants of Health  ? ?Financial Resource Strain: Not on file  ?Food Insecurity: Not on file  ?Transportation Needs: Not on file  ?Physical Activity: Not on file  ?Stress: Not on file  ?Social Connections: Not on file  ?Intimate Partner Violence: Not on file  ? ? ?Physical Exam: ?General:   Alert, chronically ill appearing, pleasant and cooperative in NAD ?Head:  Normocephalic and atraumatic. ?Eyes:  Sclera clear, no icterus.   Conjunctiva pink. ?Ears:  Normal auditory acuity. ?Nose:  No deformity, discharge,  or lesions. ?Mouth:  No deformity or lesions.   ?Neck:  Supple; no masses or thyromegaly. ?Lungs:  Clear throughout to auscultation.   No wheezes. ?Heart:  Regular rate and rhythm; no murmurs. ?  Abdomen:  Soft, thin, scaphoid, assymetric abdominal wall, mild, diffuse tenderness that is primarily in the epigastrium, periumbilical area, and LUQ; normal bowel sounds, no rebound or guarding. No hepatosplenomegaly.   ?Rectal:  Deferred  ?Msk:  Symmetrical. No boney deformities ?LAD: Palpable right inguinal lymph nodes ?Extremities:  No clubbing or edema. ?Neurologic:  Alert and  oriented x4;  grossly nonfocal ?Skin:  Intact without significant lesions or rashes. ?Psych:  Alert and cooperative. Normal mood and affect. ? ?I spent over 45 minutes, including in depth chart review, independent review of results, face-to-face time with the patient, coordinating care, ordering studies and medications as appropriate, and documentation.   ? ?Kimberly L. Tarri Glenn, MD, MPH ?04/03/2021, 9:26 AM ? ? ? ?  ?

## 2021-04-03 NOTE — Telephone Encounter (Signed)
Scheduled appt per 3/1 referral. Pt is aware of appt date and time. Pt is aware to arrive 15 mins prior to appt time and to bring and updated insurance card. Pt is aware of appt location.   ?

## 2021-04-03 NOTE — Patient Instructions (Addendum)
It was my pleasure to provide care to you today. Based on our discussion, I am providing you with my recommendations below: ? ?RECOMMENDATION(S):  ? ?REFERRAL: ? ?A referral, your demographics, a copy of your insurance card and your records will be sent to [facility], Infectious Disease and Interventional Radiology. You will receive a call from their office regarding the date, time and location of your appointment. ? ? ?FOLLOW UP: ? ?I would like for you to follow up with me if needed. Please call the office at [phone] to schedule your appointment. ? ?BMI: ? ?If you are age 16 or younger, your body mass index should be between 19-25. Your Body mass index is 19.79 kg/m?Marland Kitchen If this is out of the aformentioned range listed, please consider follow up with your Primary Care Provider.  ? ?MY CHART: ? ?The [facility] providers would like to encourage you to use [facility] to communicate with providers for non-urgent requests or questions.  Due to long hold times on the telephone, sending your provider a message by [facility] may be a faster and more efficient way to get a response.  Please allow 48 business hours for a response.  Please remember that this is for non-urgent requests.  ? ?Thank you for trusting me with your gastrointestinal care!   ? ?Thornton Park, MD, MPH ? ?

## 2021-04-04 ENCOUNTER — Encounter: Payer: Self-pay | Admitting: Internal Medicine

## 2021-04-04 ENCOUNTER — Ambulatory Visit (INDEPENDENT_AMBULATORY_CARE_PROVIDER_SITE_OTHER): Payer: Medicaid Other | Admitting: Internal Medicine

## 2021-04-04 ENCOUNTER — Other Ambulatory Visit: Payer: Self-pay

## 2021-04-04 ENCOUNTER — Ambulatory Visit (INDEPENDENT_AMBULATORY_CARE_PROVIDER_SITE_OTHER): Payer: Medicaid Other | Admitting: Pharmacist

## 2021-04-04 ENCOUNTER — Encounter: Payer: Self-pay | Admitting: Gastroenterology

## 2021-04-04 ENCOUNTER — Other Ambulatory Visit (HOSPITAL_COMMUNITY): Payer: Self-pay

## 2021-04-04 ENCOUNTER — Telehealth: Payer: Self-pay

## 2021-04-04 VITALS — BP 125/81 | HR 109 | Temp 98.2°F | Wt 146.0 lb

## 2021-04-04 DIAGNOSIS — B451 Cerebral cryptococcosis: Secondary | ICD-10-CM | POA: Diagnosis not present

## 2021-04-04 DIAGNOSIS — B20 Human immunodeficiency virus [HIV] disease: Secondary | ICD-10-CM

## 2021-04-04 DIAGNOSIS — Z91199 Patient's noncompliance with other medical treatment and regimen due to unspecified reason: Secondary | ICD-10-CM | POA: Diagnosis not present

## 2021-04-04 DIAGNOSIS — A312 Disseminated mycobacterium avium-intracellulare complex (DMAC): Secondary | ICD-10-CM | POA: Diagnosis not present

## 2021-04-04 DIAGNOSIS — R591 Generalized enlarged lymph nodes: Secondary | ICD-10-CM | POA: Diagnosis not present

## 2021-04-04 DIAGNOSIS — R1013 Epigastric pain: Secondary | ICD-10-CM | POA: Diagnosis not present

## 2021-04-04 DIAGNOSIS — Z113 Encounter for screening for infections with a predominantly sexual mode of transmission: Secondary | ICD-10-CM | POA: Diagnosis not present

## 2021-04-04 DIAGNOSIS — R1084 Generalized abdominal pain: Secondary | ICD-10-CM

## 2021-04-04 DIAGNOSIS — R109 Unspecified abdominal pain: Secondary | ICD-10-CM

## 2021-04-04 MED ORDER — SULFAMETHOXAZOLE-TRIMETHOPRIM 400-80 MG PO TABS: 1.0000 | ORAL_TABLET | Freq: Every day | ORAL | 11 refills | Status: DC

## 2021-04-04 MED ORDER — ETHAMBUTOL HCL 400 MG PO TABS: 15.0000 mg/kg | ORAL_TABLET | Freq: Every day | ORAL | 11 refills | Status: DC

## 2021-04-04 MED ORDER — AZITHROMYCIN 500 MG PO TABS
500.0000 mg | ORAL_TABLET | Freq: Every day | ORAL | 11 refills | Status: DC
  Filled 2021-04-04 – 2021-04-30 (×3): qty 30, 30d supply, fill #0

## 2021-04-04 MED ORDER — AZITHROMYCIN 500 MG PO TABS: 500.0000 mg | ORAL_TABLET | Freq: Every day | ORAL | 11 refills | Status: DC

## 2021-04-04 MED ORDER — EMTRICITABINE-TENOFOVIR DF 200-300 MG PO TABS
1.0000 | ORAL_TABLET | Freq: Every day | ORAL | 11 refills | Status: DC
  Filled 2021-04-04 – 2021-04-09 (×3): qty 30, 30d supply, fill #0

## 2021-04-04 MED ORDER — KETOROLAC TROMETHAMINE 30 MG/ML IJ SOLN
30.0000 mg | Freq: Once | INTRAMUSCULAR | Status: AC
  Administered 2021-04-04: 30 mg via INTRAMUSCULAR

## 2021-04-04 MED ORDER — FLUCONAZOLE 200 MG PO TABS
200.0000 mg | ORAL_TABLET | Freq: Every day | ORAL | 11 refills | Status: DC
  Filled 2021-04-04 – 2021-04-30 (×2): qty 30, 30d supply, fill #0

## 2021-04-04 MED ORDER — SULFAMETHOXAZOLE-TRIMETHOPRIM 400-80 MG PO TABS
1.0000 | ORAL_TABLET | Freq: Every day | ORAL | 11 refills | Status: DC
Start: 2021-04-04 — End: 2021-05-31
  Filled 2021-04-04 – 2021-04-30 (×2): qty 30, 30d supply, fill #0

## 2021-04-04 MED ORDER — DOLUTEGRAVIR SODIUM 50 MG PO TABS
50.0000 mg | ORAL_TABLET | Freq: Every day | ORAL | 11 refills | Status: DC
  Filled 2021-04-04 (×2): qty 30, 30d supply, fill #0
  Filled 2021-04-30: qty 30, 30d supply, fill #1

## 2021-04-04 MED ORDER — RIFABUTIN 150 MG PO CAPS
300.0000 mg | ORAL_CAPSULE | Freq: Every day | ORAL | 11 refills | Status: DC
Start: 2021-04-04 — End: 2021-05-31
  Filled 2021-04-04 (×2): qty 60, 30d supply, fill #0
  Filled 2021-04-30 (×2): qty 60, 30d supply, fill #1

## 2021-04-04 MED ORDER — ETHAMBUTOL HCL 400 MG PO TABS
15.0000 mg/kg | ORAL_TABLET | Freq: Every day | ORAL | 11 refills | Status: DC
  Filled 2021-04-04 – 2021-04-30 (×4): qty 75, 30d supply, fill #0

## 2021-04-04 MED ORDER — DOLUTEGRAVIR SODIUM 50 MG PO TABS: 50.0000 mg | ORAL_TABLET | Freq: Every day | ORAL | 11 refills | Status: DC

## 2021-04-04 MED ORDER — FLUCONAZOLE 200 MG PO TABS: 200.0000 mg | ORAL_TABLET | Freq: Every day | ORAL | 11 refills | Status: DC

## 2021-04-04 MED ORDER — EMTRICITABINE-TENOFOVIR DF 200-300 MG PO TABS: 1.0000 | ORAL_TABLET | Freq: Every day | ORAL | 11 refills | Status: DC

## 2021-04-04 NOTE — Telephone Encounter (Signed)
-----   Message from Thornton Park, MD sent at 04/04/2021  1:24 PM EST ----- ?Regarding: FW:  ?Please arrange for EGD as requested by Dr. Gale Journey. I'm happy to [facility] for 7:30 on an endo day to facilitate the procedure given the scheduling challenges at this time. ? ?Thanks. ? ?KLB ?----- Message ----- ?From: Jabier Mutton, MD ?Sent: 04/04/2021   1:09 PM EST ?To: Thornton Park, MD ?Subject: RE:                                           ? ?Thank you ? ?I think at this stage, all need to be done really.  ? ?I will see him again soon and help coordinate as well ? ?I appreciate your help ? ?Johnny Bridge ?----- Message ----- ?From: Thornton Park, MD ?Sent: 04/04/2021   1:07 PM EST ?To: Jabier Mutton, MD ? ?Given all of the findings on his CT, it seemed most prudent to diagnose/evaluate what had already been identified and could certainly be causing his abdominal pain prior to proceeding with endoscopic evaluation. But, if you feel this is of highest priority, I will arrange for EGD. ? ?Thanks. ? ?KLB ?----- Message ----- ?From: Jabier Mutton, MD ?Sent: 04/04/2021  12:26 PM EST ?To: Thornton Park, MD ? ?Hi Kimberly ? ?Thank you for seeing this very sick/complicated patient yesterday. ? ?I just placed another referral to GI for his abd pain symptoms ? ?He is just diagnosed with disseminated MAC which along with hiv could explain his lymphadenopathy. However multiple concomittant processes are certainly possible for this gentleman. ? ?What I worry about is other OI fungal infection or primary GI/MALT associated malignancy process.  ? ?I agree with cancer center referral as he might need the lymph node biopsy, but could you also review the case regarding endoscopy need. I think he does need that as well to evaluate for OI and if present i'll adjust my medication regimen for him ? ? ?Thank you ? ? ? ? ?Jabier Mutton, MD ?[facility] ?[place] ?[phone]  pager   [phone] cell ?04/04/2021, 12:26  PM ? ? ? ? ?

## 2021-04-04 NOTE — Assessment & Plan Note (Signed)
I am concerned with his compliance.  ? ?Given severe aids and burden of disease, I will plan to treat with azithromycin/ethambutol/rifabutin. ? ?His hiv ART should ideally be prezcobix/tivicay due to noncompliance but due to ddi, will continue with tivicay/truvada. Discussed with pharmacy team ? ?Another issue is with his current gi sx, and compliance history, wonder if he'll be able to remain on these ART/MAC regimen ? ?He'll also need monthly vision testing. Refer to self testing with ishihara test. Optometry referal placed as well ?

## 2021-04-04 NOTE — Progress Notes (Signed)
[facility] for Infectious Disease  Patient Active Problem List   Diagnosis Date Noted   Fever    Acute cholecystitis 05/39/7673   Complicated UTI (urinary tract infection) 01/22/2021   Noncompliance 01/22/2021   Lymphadenopathy    Chronic low back pain    HIV infection ([place])    Community acquired pneumonia    Nonadherence to medical treatment    COVID-19 virus infection 10/13/2020   Alcohol abuse, daily use    COVID-19 10/12/2020   Mycobacterium avium complex ([place])    Blurry vision, left eye    AKI (acute kidney injury) ([place])    Cavitary lesion of lung    Acute nonintractable headache    Smoking    Cocaine use    Paroxysmal SVT (supraventricular tachycardia) ([place])    Abnormal EKG    Atrial flutter ([place]) 03/27/2020   Aortic atherosclerosis ([place]) 03/27/2020   Hypertension 03/27/2020   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   MRN: [id number]  Chief Complaint  Patient presents with   Hospitalization Follow-up    HPI:  Ricky [name] is a 66 y.o. male AIDS, history substance abuse, history homelessness, noncompliant, here to establish clinic care for his hiv   I am familiar with him via multiple previous hospital admissions. 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 During that admission his afb blood culture also returned positive (S macrolide) (several weeks post  discharge)  He is here for establishing long term care and initial hospital follow up. He said he has taken himself off all medication (biktarvy/fluconazole/bactrim) 1 month prior to this visit  He complains of ongoing abd pain, subjective fever, dry cough, malaise, poor appetite  No headache, visual change, rash, diarrhea, black/bloody stool, focal joint pain, dysuria, penile discharge  He is currently living with his sister. His brother takes him to ID clinic today 04/04/21  He self referred to GI 04/03/21. Due to concern of lymphoma he was referred by GI to cancer center. No endscopy was done   He has known compliance issue/pattern as above.   Other relevant info: Hiv: off and on meds (mostly only take brief duration of ART biktarvy during previous admissions then off after discharged). 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 Readmitted 10/2020 with repeat induction, transitioned to consolidation fluconazole, but after discharge stopped medication again until 01/2021 admission mentioned above Hx TB per prior ID consult note s/p treatment, persistant cavitary changes Hx mac in sputum thought colonized, but 01/2021 admission due to persistent fever (confounded by covid infection) blood afb sent and was positive    Allergies  Allergen Reactions   Bactrim [Sulfamethoxazole-Trimethoprim] Other (See Comments)    Patient was trialed on DS TIW and SS daily for PJP prophylaxis and developed hyperkalemia and increased Scr      Outpatient Medications Prior to  Visit  Medication Sig Dispense Refill   acetaminophen (TYLENOL) 500 MG tablet Take 500 mg by mouth every 6 (six) hours as needed for moderate pain or headache. (Patient not taking: Reported on 04/04/2021)     bictegravir-emtricitabine-tenofovir AF (BIKTARVY) 50-200-25 MG TABS tablet Take 1 tablet by mouth daily.  (Patient not taking: Reported on 04/04/2021) 30 tablet 0   fluconazole (DIFLUCAN) 200 MG tablet Take 2 tablets (400 mg total) by mouth daily. (Patient not taking: Reported on 04/04/2021) 60 tablet 0   fluticasone (FLONASE) 50 MCG/ACT nasal spray Place 1 spray into both nostrils as needed for allergies or rhinitis. (Patient not taking: Reported on 16/11/9602) 16 g 2   folic acid (FOLVITE) 1 MG tablet Take 1 tablet (1 mg total) by mouth daily. (Patient not taking: Reported on 04/04/2021) 90 tablet 0   Multiple Vitamin (MULTIVITAMIN WITH MINERALS) TABS tablet Take 1 tablet by mouth daily. (Patient not taking: Reported on 04/04/2021)     ondansetron (ZOFRAN-ODT) 4 MG disintegrating tablet Take 1 tablet (4 mg total) by mouth every 8 (eight) hours as needed for nausea or vomiting. (Patient not taking: Reported on 04/04/2021) 10 tablet 0   pantoprazole (PROTONIX) 20 MG tablet Take 1 tablet (20 mg total) by mouth daily. (Patient not taking: Reported on 04/04/2021) 30 tablet 0   sulfamethoxazole-trimethoprim (BACTRIM) 400-80 MG tablet Take 1 tablet by mouth daily. (Patient not taking: Reported on 04/03/2021) 30 tablet 0   thiamine 100 MG tablet Take 1 tablet (100 mg total) by mouth daily. (Patient not taking: Reported on 04/04/2021) 90 tablet 1   No facility-administered medications prior to visit.     Social History   Socioeconomic History   Marital status: Married    Spouse name: Not on file   Number of children: Not on file   Years of education: Not on file   Highest education level: Not on file  Occupational History   Not on file  Tobacco Use   Smoking status: Former    Types: Cigarettes   Smokeless tobacco: Former  Substance and Sexual Activity   Alcohol use: Yes    Alcohol/week: 14.0 - 21.0 standard drinks    Types: 14 - 21 Cans of beer per week   Drug use: Not Currently   Sexual activity: Not on file  Other Topics Concern   Not on file  Social History Narrative   Not on file   Social  Determinants of Health   Financial Resource Strain: Not on file  Food Insecurity: Not on file  Transportation Needs: Not on file  Physical Activity: Not on file  Stress: Not on file  Social Connections: Not on file  Intimate Partner Violence: Not on file      Review of Systems    All other ros negative  Objective:    BP 125/81    Pulse (!) 109    Temp 98.2 F (36.8 C) (Oral)    Wt 146 lb (66.2 kg)    BMI 19.26 kg/m  Nursing note and vital signs reviewed.  Physical Exam     General/constitutional: mild-moderate distress complaining of abd pain; thin; conversant HEENT: Normocephalic, PER, Conj Clear, EOMI, Oropharynx clear Neck supple CV: rrr no mrg Lungs: clear to auscultation, normal respiratory effort Abd: Soft, Nontender Ext: no edema Skin: No Rash Neuro: nonfocal MSK: no peripheral joint swelling/tenderness/warmth; back spines nontender Psych alert/oriented   Labs: Lab Results  Component Value Date   WBC 3.6 (L) 03/23/2021   HGB 9.6 (  L) 03/23/2021   HCT 27.4 (L) 03/23/2021   MCV 98.9 03/23/2021   PLT 207 56/38/9373   Last metabolic panel Lab Results  Component Value Date   GLUCOSE 89 03/23/2021   NA 126 (L) 03/23/2021   K 3.5 03/23/2021   CL 96 (L) 03/23/2021   CO2 22 03/23/2021   BUN 5 (L) 03/23/2021   CREATININE 0.69 03/23/2021   GFRNONAA >60 03/23/2021   CALCIUM 7.7 (L) 03/23/2021   PHOS 2.1 (L) 01/24/2021   PROT 7.4 03/23/2021   ALBUMIN <1.5 (L) 03/23/2021   BILITOT 0.6 03/23/2021   ALKPHOS 419 (H) 03/23/2021   AST 69 (H) 03/23/2021   ALT 19 03/23/2021   ANIONGAP 8 03/23/2021    Micro:  Serology:  Imaging: 03/15/21 abd pelv chest ct 1. Extensive lymphadenopathy again noted in the chest and abdomen, highly concerning for malignancy such as lymphoma or leukemia. Overall, this is very similar to the prior examination, with measurements as detailed above. 2. There are findings in the lungs which are suggestive of a chronic atypical  infectious process. Further evaluation by Pulmonology and/or infectious disease is recommended. One of these nodules in the left lower lobe has become centrally cavitary compared to the prior study. Neoplasm is not entirely excluded, but not strongly favored at this time. 3. New subtle area of hypoperfusion in the lateral aspect of the interpolar region of the right kidney, nonspecific, but potentially indicative of pyelonephritis. Correlation with urinalysis is recommended. 4. Profound asymmetric mural thickening in the urinary bladder again noted. Urologic consultation is recommended to exclude the possibility of infiltrative bladder wall neoplasm. 5. No acute abnormality of the thoracolumbar spine to account for the patient's symptoms. 6. Small volume of ascites. 7. Morphologic changes in the pancreas indicative of chronic pancreatitis. 8. Aortic atherosclerosis. 9. Additional incidental findings, as above.   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   Given severe aids and burden of disease, I will plan to treat with azithromycin/ethambutol/rifabutin.  His hiv ART should ideally be prezcobix/tivicay due to noncompliance but due to ddi, will continue with tivicay/truvada. Discussed with pharmacy team  Another issue is with his current gi sx, and compliance history, wonder if he'll be able to remain on these ART/MAC regimen  He'll also need monthly vision testing. Refer to self testing with ishihara test.  Optometry referal placed as well      Relevant Medications   azithromycin (ZITHROMAX) 500 MG tablet   dolutegravir (TIVICAY) 50 MG tablet   emtricitabine-tenofovir (TRUVADA) 200-300 MG tablet   fluconazole (DIFLUCAN) 200 MG tablet   sulfamethoxazole-trimethoprim (BACTRIM) 400-80 MG tablet   Other Relevant Orders   Ambulatory referral to Optometry   AIDS (acquired immune deficiency syndrome) (H[facility])   Relevant Medications   azithromycin (ZITHROMAX) 500 MG tablet   dolutegravir (TIVICAY) 50 MG tablet   emtricitabine-tenofovir (TRUVADA) 200-300 MG tablet   ethambutol (MYAMBUTOL) 400 MG tablet   fluconazole (DIFLUCAN) 200 MG tablet   sulfamethoxazole-trimethoprim (BACTRIM) 400-80 MG tablet   rifabutin (MYCOBUTIN) 150  MG capsule   Other Relevant Orders   Ambulatory referral to Optometry   Ambulatory referral to Gastroenterology   HIV RNA, RTPCR W/R GT (RTI, PI,INT)   CBC w/Diff   COMPLETE METABOLIC PANEL WITH GFR   CT CHEST ABDOMEN PELVIS W CONTRAST   Noncompliance   Other Visit Diagnoses     Epigastric pain       Relevant Orders   Ambulatory referral to Gastroenterology   CT CHEST ABDOMEN PELVIS W CONTRAST   Screening for STDs (sexually transmitted diseases)       Relevant Orders   RPR   Generalized abdominal pain       Relevant Medications   ketorolac (TORADOL) 30 MG/ML injection 30 mg (Completed)   Generalized lymphadenopathy           #hiv/aids ?ivdu/heterosexual 03/2020 genotype pansensitive including to integrase strand inhibitor Noncompliant  Off and on biktarvy (got only during admission most of the times he was previously seen)  In setting of mac disseminated infection, adjusting ART    -discussed u=u -encourage compliance -start truvada/tivicay -labs repeat genotype today -f/u in 1 month     #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 bcx positive Lymphadenopathy Elevated lft Dry cough chronic (chronic covid infection) and old cavitary changes from distant tb  lung infection s/p tx  -start 3 drug regimen rifabutin, azith, ethambutol -refer for baseline optometry exam and periodic montor -refer to online ishihara vision testing   #crypto meningitis  -will continue fluconazole 200 mg po daily maintanance -titer in serum doesn't help much -- no cns sx at this time  -advise compliance need to prevent resistance   #hx pulm tb S/p tx distant past Persistent cavitary changes in lung stable   #generalized lad #abd pain #lft elevation Wide ddx LAD could be due to hiv/mac or other oi or lymphoma Abd pain also could be cancer/OI 2/10 ct no active liver lesion -- lft up could be due to MAC  -would need endoscopy to look for OI that can be treated -- sent inbox message to dr [place] regarding my thoughts -cancer referral placed by GI -- agree lymph node biopsy warranted as well r/o cancer -toradol shot given in clinic today -repeat ct to see if there is progression   #chronic covid infection No o2 need at this time Persistent shedding covid otherwise no obvious other complication  -monitor   #hcm -social Living with sister currently; previously homeless [place] current living situation will make him more compliant -vaccination Defer for now with low cd4 -hepatitis Will review -tb screen Hx tb pulmonary, treated -cancer screening See lymphoma concern above  Follow-up: Return in about 4 weeks (around 05/02/2021).      Jabier Mutton, [facility] [phone]  pager   [phone] cell 04/04/2021, 11:20 AM

## 2021-04-04 NOTE — Patient Instructions (Addendum)
You are very sick. These are the current infection we are treating: ?1) fungal brain infection ?2) disseminated MAC (bacterial) infection ?3) HIV/AIDS ? ?Without treatment you will likely die within the next year ?You'll need to start taking and stay on these medications: ?HIV and fungal treatment and prophylaxis ?1) Take Truvada once daily ?2) Take tivicay once daily ?3) Take fluconazole 214m once daily ?4) Take Bactrim 1 tablet once daily ? ?MAC Treatment: ?5) Azithromycin = 1 tablet once daily with or without food  ?6) Ethambutol = 2.5 tablets once daily with or without food ?7) Rifabutin = 2 tablets once daily without food (needs to be on an empty stomach) ? ? ?- Please report any vision changes to [place]?- I have referred you to eye exam ?- also do weekly self testing on the computer with this website [email or web address] ? ?- I have also referred you to gastroenterology doctor to help diagnose why you have abdominal pain ?- I have ordered ct scan for you chest, abdomen, and pelvis for diagnosis ? ? ? ?-today will give you a shot of pain medication. Take tylenol 325 mg tablet every 6 hours as needed for pain otherwise ?

## 2021-04-04 NOTE — Progress Notes (Signed)
? ?HPI: Ricky [name] is a 65 y.o. male who presents to the [facility] clinic to reestablish care for his HIV infection. ? ?Patient Active Problem List  ? Diagnosis Date Noted  ? Fever   ? Acute cholecystitis 01/22/2021  ? Complicated UTI (urinary tract infection) 01/22/2021  ? Noncompliance 01/22/2021  ? Lymphadenopathy   ? Chronic low back pain   ? HIV infection ([place])   ? Community acquired pneumonia   ? Nonadherence to medical treatment   ? COVID-19 virus infection 10/13/2020  ? Alcohol abuse, daily use   ? COVID-19 10/12/2020  ? 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 Blurry vision, left eye   ? AKI (acute kidney injury) ([place])   ? Cavitary lesion of lung   ? Acute nonintractable headache   ? Smoking   ? Cocaine use   ? Paroxysmal SVT (supraventricular tachycardia) ([facility])   ? Abnormal EKG   ? Atrial flutter ([place]) 03/27/2020  ? Aortic atherosclerosis ([place]) 03/27/2020  ? Hypertension 03/27/2020  ? 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 Leukocytopenia 03/26/2020  ? Macrocytosis 03/26/2020  ? Moderate protein malnutrition ([place]) 03/26/2020  ? Hypoalbuminemia 03/26/2020  ? AIDS (acquired immune deficiency syndrome) ([place]) 03/26/2020  ? ? ?Patient's Medications  ?New Prescriptions  ? RIFABUTIN (MYCOBUTIN) 150 MG CAPSULE    Take 2 capsules (300 mg total) by mouth daily.  ?Previous Medications  ? ACETAMINOPHEN (TYLENOL) 500 MG TABLET    Take 500 mg by mouth every 6 (six) hours as needed for moderate pain or headache.  ? FLUTICASONE (FLONASE) 50 MCG/ACT NASAL SPRAY    Place 1 spray into both nostrils as needed for allergies or rhinitis.  ? FOLIC ACID (FOLVITE) 1 MG TABLET    Take 1 tablet (1 mg total) by mouth daily.  ? ONDANSETRON (ZOFRAN-ODT) 4 MG DISINTEGRATING TABLET    Take 1 tablet (4 mg total) by mouth every 8 (eight) hours as needed for nausea or vomiting.  ? PANTOPRAZOLE (PROTONIX) 20 MG TABLET    Take 1 tablet (20 mg total) by mouth daily.  ?Modified Medications  ?  Modified Medication Previous Medication  ? AZITHROMYCIN (ZITHROMAX) 500 MG TABLET azithromycin (ZITHROMAX) 500 MG tablet  ?    Take 1 tablet (500 mg total) by mouth daily.    Take 1 tablet (500 mg total) by mouth daily.  ? DOLUTEGRAVIR (TIVICAY) 50 MG TABLET dolutegravir (TIVICAY) 50 MG tablet  ?    Take 1 tablet (50 mg total) by mouth daily.    Take 1 tablet (50 mg total) by mouth daily.  ? EMTRICITABINE-TENOFOVIR (TRUVADA) 200-300 MG TABLET emtricitabine-tenofovir (TRUVADA) 200-300 MG tablet  ?    Take 1 tablet by mouth daily.    Take 1 tablet by mouth daily.  ? ETHAMBUTOL (MYAMBUTOL) 400 MG TABLET ethambutol (MYAMBUTOL) 400 MG tablet  ?    Take 2.5 tablets (1,000 mg total) by mouth daily.    Take 2.5 tablets (1,000 mg total) by mouth daily.  ? FLUCONAZOLE (DIFLUCAN) 200 MG TABLET fluconazole (DIFLUCAN) 200 MG tablet  ?    Take 1 tablet (200 mg total) by mouth daily.    Take 1 tablet (200 mg total) by mouth daily.  ? SULFAMETHOXAZOLE-TRIMETHOPRIM (BACTRIM) 400-80 MG TABLET sulfamethoxazole-trimethoprim (BACTRIM) 400-80 MG tablet  ?    Take 1 tablet by mouth daily.    Take 1 tablet by mouth daily.  ?Discontinued Medications  ? No medications on file  ? ? ?Allergies: ?Allergies  ?Allergen  Reactions  ? Bactrim [Sulfamethoxazole-Trimethoprim] Other (See Comments)  ?  Patient was trialed on DS TIW and SS daily for PJP prophylaxis and developed hyperkalemia and increased Scr  ? ? ?Past Medical History: ?Past Medical History:  ?Diagnosis Date  ? Human immunodeficiency virus (HIV) disease ([place]) 03/26/2020  ? ? ?Social History: ?Social History  ? ?Socioeconomic History  ? Marital status: Married  ?  Spouse name: Not on file  ? Number of children: Not on file  ? Years of education: Not on file  ? Highest education level: Not on file  ?Occupational History  ? Not on file  ?Tobacco Use  ? Smoking status: Former  ?  Types: Cigarettes  ? Smokeless tobacco: Former  ?Substance and Sexual Activity  ? Alcohol use: Yes  ?   Alcohol/week: 14.0 - 21.0 standard drinks  ?  Types: 14 - 21 Cans of beer per week  ? Drug use: Not Currently  ? Sexual activity: Not on file  ?Other Topics Concern  ? Not on file  ?Social History Narrative  ? Not on file  ? ?Social Determinants of Health  ? ?Financial Resource Strain: Not on file  ?Food Insecurity: Not on file  ?Transportation Needs: Not on file  ?Physical Activity: Not on file  ?Stress: Not on file  ?Social Connections: Not on file  ? ? ?Labs: ?Lab Results  ?Component Value Date  ? HIV1RNAQUANT 111,000 01/22/2021  ? HIV1RNAQUANT 25,300 03/27/2020  ? HIV1RNAQUANT 35,500 03/27/2020  ? CD4TABS <35 (L) 01/22/2021  ? CD4TABS <35 (L) 10/15/2020  ? ? ?RPR and STI ?Lab Results  ?Component Value Date  ? LABRPR NON REACTIVE 10/16/2020  ? ? ?STI Results GC CT  ?10/15/2020 Negative Negative  ? ? ?Hepatitis B ?Lab Results  ?Component Value Date  ? HEPBSAG NON REACTIVE 10/16/2020  ? HEPBCAB Reactive (A) 04/03/2020  ? ?Hepatitis C ?No results found for: [place], HCVRNAPCRQN ?Hepatitis A ?Lab Results  ?Component Value Date  ? HAV Reactive (A) 04/03/2020  ? ?Lipids: ?Lab Results  ?Component Value Date  ? CHOL 141 03/29/2020  ? TRIG 118 03/29/2020  ? HDL 31 (L) 03/29/2020  ? CHOLHDL 4.5 03/29/2020  ? VLDL 24 03/29/2020  ? [place] 86 03/29/2020  ? ? ?Current HIV Regimen: ?Biktarvy (has been holding) ? ?Assessment: ?Ricky [name] presents to clinic today to reestablish care for his chronic HIV infection with Dr. Gale Journey. He has been seen by our ID team in the hospital during multiple admissions in February, September, and December 2022. He struggles with chronic non-adherence to any medication and states he last stopped taking his medications one month ago. Previously was given Biktarvy at his hospital admissions in 2022. 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 Patient unable to start PI due to drug interactions with MAC treatment (see  below paragraph). Will start Tivicay and Truvada at this time due to DDI with rifabutin and TAF and bictegravir. Will check HIV RNA with genotype for resistance assessment as it was not collected during his last hospitalization. ? ?Patient experiencing abdominal symptoms along with fevers; concern for disseminated MAC. Sensitivities available from 12/22 culture under microbiology results. Patient previously prescribed ethambutol, rifabutin, and azithromycin for his pulmonary MAC which was later discontinued due to concern for colonization v infection. Will resume treatment now with the above agents per Dr. Gale Journey. Patient will need azithromycin 557m, ethambutol 1053m(~15 mg/kg), and rifabutin 30091mnce daily. As patient  has Medicaid, he can fill these at [facility] for $4 each. Upon discussing medication costs, patient became increasingly agitated and belligerent during his visit today. He refuses to pay for any medication as he cannot afford it at this time. Reviewed the extremely high importance of taking his medications given his serious infections; however, patient would consistently interrupt my education. We can charge his copays to an account which he will get a statement for but this will only work for the first $100. After that, [facility] will no longer fill his medications until he pays. There is no other option for him; every other pharmacy will require payment.  ? ?As patient was no longer willing to listen during his visit with me, scheduled him for a follow-up in 2 weeks where we can more completely cover key counseling points of each HIV and MAC medication as well as his fluconazole and Bactrim. We will courier his medications from [facility] and fill pillboxes every 2 weeks to assess adherence. Dr. Gale Journey emphasized the importance of adherence due to the severity of his infections.  ? ?Plan: ?Check HIV genotype ?Prescriptions sent to [facility] (Bactrim, fluconazole, Tivicay, Truvada, ethambutol, rifabutin, and  azithromycin) ?Follow-up with me on 3/14 for pillbox and counseling ? ?Alfonse Spruce, PharmD, CPP ?Clinical Pharmacist Practitioner ?Infectious Diseases Clinical Pharmacist ?[facility] ?04/04/2021, 2:15 PM ?

## 2021-04-04 NOTE — Progress Notes (Deleted)
[place] [phone]   [phone]) [phone]  INITIAL CONSULTATION:  Patient Care Team: Pcp, No as PCP - General  CHIEF COMPLAINTS/PURPOSE OF CONSULTATION:  "*** "  HISTORY OF PRESENTING ILLNESS:  Ricky [name] 66 y.o. male with medical history significant for ***  On review of the previous records ***  On exam today ***  MEDICAL HISTORY:  Past Medical History:  Diagnosis Date   Human immunodeficiency virus (HIV) disease ([place]) 03/26/2020    SURGICAL HISTORY: Past Surgical History:  Procedure Laterality Date   IR FL GUIDED LOC OF NEEDLE/CATH TIP FOR SPINAL INJECTION LT  10/17/2020   SVT ABLATION N/A 04/23/2020   Procedure: SVT ABLATION;  Surgeon: Evans Lance, MD;  Location: [facility];  Service: Cardiovascular;  Laterality: N/A;    SOCIAL HISTORY: Social History   Socioeconomic History   Marital status: Married    Spouse name: Not on file   Number of children: Not on file   Years of education: Not on file   Highest education level: Not on file  Occupational History   Not on file  Tobacco Use   Smoking status: Former    Types: Cigarettes   Smokeless tobacco: Former  Substance and Sexual Activity   Alcohol use: Yes    Alcohol/week: 14.0 - 21.0 standard drinks    Types: 14 - 21 Cans of beer per week   Drug use: Not Currently   Sexual activity: Not on file  Other Topics Concern   Not on file  Social History Narrative   Not on file   Social Determinants of Health   Financial Resource Strain: Not on file  Food Insecurity: Not on file  Transportation Needs: Not on file  Physical Activity: Not on file  Stress: Not on file  Social Connections: Not on file  Intimate Partner Violence: Not on file    FAMILY HISTORY: Family History  Problem Relation Age of Onset   Hypertension Mother    Kidney disease Mother    Hypertension Sister    Hypertension Brother    Kidney cancer Brother    Colon  cancer Neg Hx    Liver cancer Neg Hx    Pancreatic cancer Neg Hx    Esophageal cancer Neg Hx     ALLERGIES:  is allergic to bactrim [sulfamethoxazole-trimethoprim].  MEDICATIONS:  Current Outpatient Medications  Medication Sig Dispense Refill   acetaminophen (TYLENOL) 500 MG tablet Take 500 mg by mouth every 6 (six) hours as needed for moderate pain or headache.     bictegravir-emtricitabine-tenofovir AF (BIKTARVY) 50-200-25 MG TABS tablet Take 1 tablet by mouth daily. 30 tablet 0   fluconazole (DIFLUCAN) 200 MG tablet Take 2 tablets (400 mg total) by mouth daily. 60 tablet 0   fluticasone (FLONASE) 50 MCG/ACT nasal spray Place 1 spray into both nostrils as needed for allergies or rhinitis. (Patient not taking: Reported on 86/57/8469) 16 g 2   folic acid (FOLVITE) 1 MG tablet Take 1 tablet (1 mg total) by mouth daily. 90 tablet 0   Multiple Vitamin (MULTIVITAMIN WITH MINERALS) TABS tablet Take 1 tablet by mouth daily.     ondansetron (ZOFRAN-ODT) 4 MG disintegrating tablet Take 1 tablet (4 mg total) by mouth every 8 (eight) hours as needed for nausea or vomiting. 10 tablet 0   pantoprazole (PROTONIX) 20 MG tablet Take 1 tablet (20 mg total) by mouth daily. 30 tablet 0   sulfamethoxazole-trimethoprim (BACTRIM) 400-80 MG tablet Take  1 tablet by mouth daily. (Patient not taking: Reported on 04/03/2021) 30 tablet 0   thiamine 100 MG tablet Take 1 tablet (100 mg total) by mouth daily. 90 tablet 1   No current facility-administered medications for this visit.    REVIEW OF SYSTEMS:   Constitutional: ( - ) fevers, ( - )  chills , ( - ) night sweats Eyes: ( - ) blurriness of vision, ( - ) double vision, ( - ) watery eyes Ears, nose, mouth, throat, and face: ( - ) mucositis, ( - ) sore throat Respiratory: ( - ) cough, ( - ) dyspnea, ( - ) wheezes Cardiovascular: ( - ) palpitation, ( - ) chest discomfort, ( - ) lower extremity swelling Gastrointestinal:  ( - ) nausea, ( - ) heartburn, ( - ) change  in bowel habits Skin: ( - ) abnormal skin rashes Lymphatics: ( - ) new lymphadenopathy, ( - ) easy bruising Neurological: ( - ) numbness, ( - ) tingling, ( - ) new weaknesses Behavioral/Psych: ( - ) mood change, ( - ) new changes  All other systems were reviewed with the patient and are negative.  PHYSICAL EXAMINATION: ECOG PERFORMANCE STATUS: {CHL ONC ECOG PS:[id number]}  There were no vitals filed for this visit. There were no vitals filed for this visit.  GENERAL: well appearing *** in NAD  SKIN: skin color, texture, turgor are normal, no rashes or significant lesions EYES: conjunctiva are pink and non-injected, sclera clear OROPHARYNX: no exudate, no erythema; lips, buccal mucosa, and tongue normal  NECK: supple, non-tender LYMPH:  no palpable lymphadenopathy in the cervical, axillary or supraclavicular lymph nodes.  LUNGS: clear to auscultation and percussion with normal breathing effort HEART: regular rate & rhythm and no murmurs and no lower extremity edema ABDOMEN: soft, non-tender, non-distended, normal bowel sounds Musculoskeletal: no cyanosis of digits and no clubbing  PSYCH: alert & oriented x 3, fluent speech NEURO: no focal motor/sensory deficits  LABORATORY DATA:  I have reviewed the data as listed CBC Latest Ref Rng & Units 03/23/2021 03/15/2021 02/15/2021  WBC 4.0 - 10.5 K/uL 3.6(L) 3.0(L) 2.6(L)  Hemoglobin 13.0 - 17.0 g/dL 9.6(L) 8.4(L) 11.2(L)  Hematocrit 39.0 - 52.0 % 27.4(L) 24.3(L) 32.9(L)  Platelets 150 - 400 K/uL 207 175 208    CMP Latest Ref Rng & Units 03/23/2021 03/15/2021 02/15/2021  Glucose 70 - 99 mg/dL 89 101(H) 149(H)  BUN 8 - 23 mg/dL 5(L) 7(L) 7(L)  Creatinine 0.61 - 1.24 mg/dL 0.69 0.89 0.84  Sodium 135 - 145 mmol/L 126(L) 129(L) 127(L)  Potassium 3.5 - 5.1 mmol/L 3.5 3.4(L) 4.9  Chloride 98 - 111 mmol/L 96(L) 97(L) 95(L)  CO2 22 - 32 mmol/L 22 22 24   Calcium 8.9 - 10.3 mg/dL 7.7(L) 7.5(L) 8.4(L)  Total Protein 6.5 - 8.1 g/dL 7.4 6.3(L) 7.6   Total Bilirubin 0.3 - 1.2 mg/dL 0.6 0.7 1.2  Alkaline Phos 38 - 126 U/L 419(H) 352(H) 518(H)  AST 15 - 41 U/L 69(H) 65(H) 112(H)  ALT 0 - 44 U/L 19 15 32     RADIOGRAPHIC STUDIES: I have personally reviewed the radiological images as listed and agreed with the findings in the report. CT CHEST ABDOMEN PELVIS W CONTRAST  Result Date: 03/15/2021 CLINICAL DATA:  66 year old male with history of diffuse body pain for 1 month, most severe in the back. Lymphadenopathy. EXAM: CT CHEST, ABDOMEN, AND PELVIS WITH CONTRAST CT THORACIC SPINE WITHOUT CONTRAST CT LUMBAR SPINE WITHOUT CONTRAST TECHNIQUE: Multidetector CT imaging of  the chest, abdomen and pelvis was performed following the standard protocol during bolus administration of intravenous contrast. Multidetector CT imaging of the thoracic spine was performed without intravenous contrast administration. Multiplanar CT image reconstructions were also generated. Multidetector CT imaging of the lumbar spine was performed without intravenous contrast administration. Multiplanar CT image reconstructions were also generated. RADIATION DOSE REDUCTION: This exam was performed according to the departmental dose-optimization program which includes automated exposure control, adjustment of the mA and/or kV according to patient size and/or use of iterative reconstruction technique. CONTRAST:  43mL OMNIPAQUE IOHEXOL 300 MG/ML  SOLN COMPARISON:  CT the abdomen and pelvis 02/16/2021. CT the chest 01/22/2021. FINDINGS: CT CHEST FINDINGS Cardiovascular: Heart size is normal. Trace amount of pericardial fluid and/or thickening, unlikely to be of any hemodynamic significance at this time. No pericardial calcification. Atherosclerotic calcifications are noted in the thoracic aorta. No definite coronary artery calcifications are noted. Mediastinum/Nodes: Extensive mediastinal and hilar lymphadenopathy is again noted, similar to the prior study. The largest lymph node on today's  examination is in the subcarinal nodal station measuring up to 3.3 cm in short axis (axial image 34 of series 3), with some internal areas of low attenuation which may reflect internal necrosis. Largest hilar lymph node is on the left measuring 1.8 cm in short axis (axial image 27 of series 3). Esophagus is unremarkable in appearance. No axillary lymphadenopathy. Lungs/Pleura: Extensive cylindrical and varicose bronchiectasis is again noted throughout the left lung, most apparent in the left upper lobe. Multiple pulmonary nodules are again noted, relatively similar to the prior study. The majority of these are clustered in the left lung, solid in appearance, and measuring up to 1.5 x 1.1 cm in the medial aspect of the left upper lobe (axial image 35 of series 5). In addition, there is a nodule in the anterior aspect of the left lower lobe near the lung base (axial image 114 of series 5) which is slightly smaller than the prior examination, but now demonstrates some internal cavitation and thick walls, currently measuring 1.8 x 1.6 cm. Trace amount of left pleural fluid/thickening, decreased compared to the prior study. No right pleural effusion. Musculoskeletal: No acute abnormality of the thoracic spine. There are no aggressive appearing lytic or blastic lesions noted in the visualized portions of the skeleton. CT ABDOMEN PELVIS FINDINGS Hepatobiliary: No suspicious cystic or solid hepatic lesions. No intra or extrahepatic biliary ductal dilatation. Periportal edema is noted. Gallbladder is not distended. No calcified gallstones are noted. Gallbladder wall appears edematous, and is surrounded by a small volume of ascites (nonspecific). Pancreas: Innumerable coarse calcifications are noted throughout the pancreas, indicative of chronic pancreatitis. No discrete pancreatic mass. No pancreatic ductal dilatation. No focal peripancreatic fluid collections or definite inflammatory changes. Spleen: Unremarkable.  Adrenals/Urinary Tract: Cortical atrophy in the lower pole the left kidney again noted. Subtle area of hypoenhancement in the interpolar region of the right kidney (axial image 77 of series 3), new compared to the prior examination. Bilateral adrenal glands are normal in appearance. No hydroureteronephrosis. Profound asymmetric thickening of the wall of the urinary bladder most severe anterolaterally on the right where this measures up to 1.4 cm in thickness. Stomach/Bowel: The appearance of the stomach is normal. There is no pathologic dilatation of small bowel or colon. Normal appendix. Vascular/Lymphatic: Aortic atherosclerosis, without evidence of aneurysm or dissection in the abdominal or pelvic vasculature. Extensive lymphadenopathy again noted throughout the abdomen, most evident in the retroperitoneum where the largest nodal mass measures 3.2 x  3.9 cm in the left para-aortic nodal station (axial image 74 of series 3). Reproductive: Prostate gland and seminal vesicles are unremarkable in appearance. Other: Small volume of ascites.  No pneumoperitoneum. Musculoskeletal: No acute abnormality of the lumbar spine. There are no aggressive appearing lytic or blastic lesions noted in the visualized portions of the skeleton. IMPRESSION: 1. Extensive lymphadenopathy again noted in the chest and abdomen, highly concerning for malignancy such as lymphoma or leukemia. Overall, this is very similar to the prior examination, with measurements as detailed above. 2. There are findings in the lungs which are suggestive of a chronic atypical infectious process. Further evaluation by Pulmonology and/or infectious disease is recommended. One of these nodules in the left lower lobe has become centrally cavitary compared to the prior study. Neoplasm is not entirely excluded, but not strongly favored at this time. 3. New subtle area of hypoperfusion in the lateral aspect of the interpolar region of the right kidney, nonspecific,  but potentially indicative of pyelonephritis. Correlation with urinalysis is recommended. 4. Profound asymmetric mural thickening in the urinary bladder again noted. Urologic consultation is recommended to exclude the possibility of infiltrative bladder wall neoplasm. 5. No acute abnormality of the thoracolumbar spine to account for the patient's symptoms. 6. Small volume of ascites. 7. Morphologic changes in the pancreas indicative of chronic pancreatitis. 8. Aortic atherosclerosis. 9. Additional incidental findings, as above. Electronically Signed   By: Vinnie Langton M.D.   On: 03/15/2021 07:38   CT T-SPINE NO CHARGE  Result Date: 03/15/2021 CLINICAL DATA:  66 year old male with history of diffuse body pain for 1 month, most severe in the back. Lymphadenopathy. EXAM: CT CHEST, ABDOMEN, AND PELVIS WITH CONTRAST CT THORACIC SPINE WITHOUT CONTRAST CT LUMBAR SPINE WITHOUT CONTRAST TECHNIQUE: Multidetector CT imaging of the chest, abdomen and pelvis was performed following the standard protocol during bolus administration of intravenous contrast. Multidetector CT imaging of the thoracic spine was performed without intravenous contrast administration. Multiplanar CT image reconstructions were also generated. Multidetector CT imaging of the lumbar spine was performed without intravenous contrast administration. Multiplanar CT image reconstructions were also generated. RADIATION DOSE REDUCTION: This exam was performed according to the departmental dose-optimization program which includes automated exposure control, adjustment of the mA and/or kV according to patient size and/or use of iterative reconstruction technique. CONTRAST:  30mL OMNIPAQUE IOHEXOL 300 MG/ML  SOLN COMPARISON:  CT the abdomen and pelvis 02/16/2021. CT the chest 01/22/2021. FINDINGS: CT CHEST FINDINGS Cardiovascular: Heart size is normal. Trace amount of pericardial fluid and/or thickening, unlikely to be of any hemodynamic significance at this  time. No pericardial calcification. Atherosclerotic calcifications are noted in the thoracic aorta. No definite coronary artery calcifications are noted. Mediastinum/Nodes: Extensive mediastinal and hilar lymphadenopathy is again noted, similar to the prior study. The largest lymph node on today's examination is in the subcarinal nodal station measuring up to 3.3 cm in short axis (axial image 34 of series 3), with some internal areas of low attenuation which may reflect internal necrosis. Largest hilar lymph node is on the left measuring 1.8 cm in short axis (axial image 27 of series 3). Esophagus is unremarkable in appearance. No axillary lymphadenopathy. Lungs/Pleura: Extensive cylindrical and varicose bronchiectasis is again noted throughout the left lung, most apparent in the left upper lobe. Multiple pulmonary nodules are again noted, relatively similar to the prior study. The majority of these are clustered in the left lung, solid in appearance, and measuring up to 1.5 x 1.1 cm in the medial aspect  of the left upper lobe (axial image 35 of series 5). In addition, there is a nodule in the anterior aspect of the left lower lobe near the lung base (axial image 114 of series 5) which is slightly smaller than the prior examination, but now demonstrates some internal cavitation and thick walls, currently measuring 1.8 x 1.6 cm. Trace amount of left pleural fluid/thickening, decreased compared to the prior study. No right pleural effusion. Musculoskeletal: No acute abnormality of the thoracic spine. There are no aggressive appearing lytic or blastic lesions noted in the visualized portions of the skeleton. CT ABDOMEN PELVIS FINDINGS Hepatobiliary: No suspicious cystic or solid hepatic lesions. No intra or extrahepatic biliary ductal dilatation. Periportal edema is noted. Gallbladder is not distended. No calcified gallstones are noted. Gallbladder wall appears edematous, and is surrounded by a small volume of ascites  (nonspecific). Pancreas: Innumerable coarse calcifications are noted throughout the pancreas, indicative of chronic pancreatitis. No discrete pancreatic mass. No pancreatic ductal dilatation. No focal peripancreatic fluid collections or definite inflammatory changes. Spleen: Unremarkable. Adrenals/Urinary Tract: Cortical atrophy in the lower pole the left kidney again noted. Subtle area of hypoenhancement in the interpolar region of the right kidney (axial image 77 of series 3), new compared to the prior examination. Bilateral adrenal glands are normal in appearance. No hydroureteronephrosis. Profound asymmetric thickening of the wall of the urinary bladder most severe anterolaterally on the right where this measures up to 1.4 cm in thickness. Stomach/Bowel: The appearance of the stomach is normal. There is no pathologic dilatation of small bowel or colon. Normal appendix. Vascular/Lymphatic: Aortic atherosclerosis, without evidence of aneurysm or dissection in the abdominal or pelvic vasculature. Extensive lymphadenopathy again noted throughout the abdomen, most evident in the retroperitoneum where the largest nodal mass measures 3.2 x 3.9 cm in the left para-aortic nodal station (axial image 74 of series 3). Reproductive: Prostate gland and seminal vesicles are unremarkable in appearance. Other: Small volume of ascites.  No pneumoperitoneum. Musculoskeletal: No acute abnormality of the lumbar spine. There are no aggressive appearing lytic or blastic lesions noted in the visualized portions of the skeleton. IMPRESSION: 1. Extensive lymphadenopathy again noted in the chest and abdomen, highly concerning for malignancy such as lymphoma or leukemia. Overall, this is very similar to the prior examination, with measurements as detailed above. 2. There are findings in the lungs which are suggestive of a chronic atypical infectious process. Further evaluation by Pulmonology and/or infectious disease is recommended. One of  these nodules in the left lower lobe has become centrally cavitary compared to the prior study. Neoplasm is not entirely excluded, but not strongly favored at this time. 3. New subtle area of hypoperfusion in the lateral aspect of the interpolar region of the right kidney, nonspecific, but potentially indicative of pyelonephritis. Correlation with urinalysis is recommended. 4. Profound asymmetric mural thickening in the urinary bladder again noted. Urologic consultation is recommended to exclude the possibility of infiltrative bladder wall neoplasm. 5. No acute abnormality of the thoracolumbar spine to account for the patient's symptoms. 6. Small volume of ascites. 7. Morphologic changes in the pancreas indicative of chronic pancreatitis. 8. Aortic atherosclerosis. 9. Additional incidental findings, as above. Electronically Signed   By: Vinnie Langton M.D.   On: 03/15/2021 07:38   CT L-SPINE NO CHARGE  Result Date: 03/15/2021 CLINICAL DATA:  66 year old male with history of diffuse body pain for 1 month, most severe in the back. Lymphadenopathy. EXAM: CT CHEST, ABDOMEN, AND PELVIS WITH CONTRAST CT THORACIC SPINE WITHOUT CONTRAST  CT LUMBAR SPINE WITHOUT CONTRAST TECHNIQUE: Multidetector CT imaging of the chest, abdomen and pelvis was performed following the standard protocol during bolus administration of intravenous contrast. Multidetector CT imaging of the thoracic spine was performed without intravenous contrast administration. Multiplanar CT image reconstructions were also generated. Multidetector CT imaging of the lumbar spine was performed without intravenous contrast administration. Multiplanar CT image reconstructions were also generated. RADIATION DOSE REDUCTION: This exam was performed according to the departmental dose-optimization program which includes automated exposure control, adjustment of the mA and/or kV according to patient size and/or use of iterative reconstruction technique. CONTRAST:   35mL OMNIPAQUE IOHEXOL 300 MG/ML  SOLN COMPARISON:  CT the abdomen and pelvis 02/16/2021. CT the chest 01/22/2021. FINDINGS: CT CHEST FINDINGS Cardiovascular: Heart size is normal. Trace amount of pericardial fluid and/or thickening, unlikely to be of any hemodynamic significance at this time. No pericardial calcification. Atherosclerotic calcifications are noted in the thoracic aorta. No definite coronary artery calcifications are noted. Mediastinum/Nodes: Extensive mediastinal and hilar lymphadenopathy is again noted, similar to the prior study. The largest lymph node on today's examination is in the subcarinal nodal station measuring up to 3.3 cm in short axis (axial image 34 of series 3), with some internal areas of low attenuation which may reflect internal necrosis. Largest hilar lymph node is on the left measuring 1.8 cm in short axis (axial image 27 of series 3). Esophagus is unremarkable in appearance. No axillary lymphadenopathy. Lungs/Pleura: Extensive cylindrical and varicose bronchiectasis is again noted throughout the left lung, most apparent in the left upper lobe. Multiple pulmonary nodules are again noted, relatively similar to the prior study. The majority of these are clustered in the left lung, solid in appearance, and measuring up to 1.5 x 1.1 cm in the medial aspect of the left upper lobe (axial image 35 of series 5). In addition, there is a nodule in the anterior aspect of the left lower lobe near the lung base (axial image 114 of series 5) which is slightly smaller than the prior examination, but now demonstrates some internal cavitation and thick walls, currently measuring 1.8 x 1.6 cm. Trace amount of left pleural fluid/thickening, decreased compared to the prior study. No right pleural effusion. Musculoskeletal: No acute abnormality of the thoracic spine. There are no aggressive appearing lytic or blastic lesions noted in the visualized portions of the skeleton. CT ABDOMEN PELVIS FINDINGS  Hepatobiliary: No suspicious cystic or solid hepatic lesions. No intra or extrahepatic biliary ductal dilatation. Periportal edema is noted. Gallbladder is not distended. No calcified gallstones are noted. Gallbladder wall appears edematous, and is surrounded by a small volume of ascites (nonspecific). Pancreas: Innumerable coarse calcifications are noted throughout the pancreas, indicative of chronic pancreatitis. No discrete pancreatic mass. No pancreatic ductal dilatation. No focal peripancreatic fluid collections or definite inflammatory changes. Spleen: Unremarkable. Adrenals/Urinary Tract: Cortical atrophy in the lower pole the left kidney again noted. Subtle area of hypoenhancement in the interpolar region of the right kidney (axial image 77 of series 3), new compared to the prior examination. Bilateral adrenal glands are normal in appearance. No hydroureteronephrosis. Profound asymmetric thickening of the wall of the urinary bladder most severe anterolaterally on the right where this measures up to 1.4 cm in thickness. Stomach/Bowel: The appearance of the stomach is normal. There is no pathologic dilatation of small bowel or colon. Normal appendix. Vascular/Lymphatic: Aortic atherosclerosis, without evidence of aneurysm or dissection in the abdominal or pelvic vasculature. Extensive lymphadenopathy again noted throughout the abdomen, most evident in  the retroperitoneum where the largest nodal mass measures 3.2 x 3.9 cm in the left para-aortic nodal station (axial image 74 of series 3). Reproductive: Prostate gland and seminal vesicles are unremarkable in appearance. Other: Small volume of ascites.  No pneumoperitoneum. Musculoskeletal: No acute abnormality of the lumbar spine. There are no aggressive appearing lytic or blastic lesions noted in the visualized portions of the skeleton. IMPRESSION: 1. Extensive lymphadenopathy again noted in the chest and abdomen, highly concerning for malignancy such as  lymphoma or leukemia. Overall, this is very similar to the prior examination, with measurements as detailed above. 2. There are findings in the lungs which are suggestive of a chronic atypical infectious process. Further evaluation by Pulmonology and/or infectious disease is recommended. One of these nodules in the left lower lobe has become centrally cavitary compared to the prior study. Neoplasm is not entirely excluded, but not strongly favored at this time. 3. New subtle area of hypoperfusion in the lateral aspect of the interpolar region of the right kidney, nonspecific, but potentially indicative of pyelonephritis. Correlation with urinalysis is recommended. 4. Profound asymmetric mural thickening in the urinary bladder again noted. Urologic consultation is recommended to exclude the possibility of infiltrative bladder wall neoplasm. 5. No acute abnormality of the thoracolumbar spine to account for the patient's symptoms. 6. Small volume of ascites. 7. Morphologic changes in the pancreas indicative of chronic pancreatitis. 8. Aortic atherosclerosis. 9. Additional incidental findings, as above. Electronically Signed   By: Vinnie Langton M.D.   On: 03/15/2021 07:38    ASSESSMENT & PLAN ***  No orders of the defined types were placed in this encounter.   All questions were answered. The patient knows to call the clinic with any problems, questions or concerns.  I have spent a total of {CHL ONC TIME VISIT - ZOXWR:[id number]} minutes of face-to-face and non-face-to-face time, preparing to see the patient, obtaining and/or reviewing separately obtained history, performing a medically appropriate examination, counseling and educating the patient, ordering medications/tests/procedures, referring and communicating with other health care professionals, documenting clinical information in the electronic health record, independently interpreting results and communicating results to the patient, and care  coordination.   Dede Query, PA-C Department of Hematology/Oncology [place] at [facility] Phone: [phone]

## 2021-04-04 NOTE — Telephone Encounter (Signed)
Pt does not have a signed DPR on file. Only able to speak with pt until signed. Called pt to schedule urgent EGD per Dr. Tarri Glenn' and Dr. Hart Rochester request. Pt scheduled @ [facility] on 04/12/21 @ 7am, arrival time 645am. Amb referral placed for auth purposes. Pt will stop by SECOND FLOOR of our office to pick up prep instructions. ?

## 2021-04-05 ENCOUNTER — Inpatient Hospital Stay: Payer: Medicaid Other

## 2021-04-05 ENCOUNTER — Other Ambulatory Visit: Payer: Self-pay

## 2021-04-05 ENCOUNTER — Other Ambulatory Visit (HOSPITAL_COMMUNITY): Payer: Self-pay

## 2021-04-05 ENCOUNTER — Inpatient Hospital Stay: Payer: Medicaid Other | Attending: Physician Assistant | Admitting: Physician Assistant

## 2021-04-05 VITALS — BP 114/81 | HR 100 | Temp 98.1°F | Resp 18 | Wt 147.1 lb

## 2021-04-05 DIAGNOSIS — N3289 Other specified disorders of bladder: Secondary | ICD-10-CM | POA: Insufficient documentation

## 2021-04-05 DIAGNOSIS — Z21 Asymptomatic human immunodeficiency virus [HIV] infection status: Secondary | ICD-10-CM | POA: Insufficient documentation

## 2021-04-05 DIAGNOSIS — R591 Generalized enlarged lymph nodes: Secondary | ICD-10-CM

## 2021-04-05 DIAGNOSIS — D649 Anemia, unspecified: Secondary | ICD-10-CM | POA: Insufficient documentation

## 2021-04-05 DIAGNOSIS — R1013 Epigastric pain: Secondary | ICD-10-CM | POA: Insufficient documentation

## 2021-04-05 DIAGNOSIS — R109 Unspecified abdominal pain: Secondary | ICD-10-CM | POA: Diagnosis not present

## 2021-04-05 DIAGNOSIS — R112 Nausea with vomiting, unspecified: Secondary | ICD-10-CM | POA: Diagnosis not present

## 2021-04-05 LAB — IRON AND IRON BINDING CAPACITY (CC-WL,HP ONLY)
Iron: 43 ug/dL — ABNORMAL LOW (ref 45–182)
Saturation Ratios: 63 % — ABNORMAL HIGH (ref 17.9–39.5)
TIBC: 69 ug/dL — ABNORMAL LOW (ref 250–450)
UIBC: 26 ug/dL — ABNORMAL LOW (ref 117–376)

## 2021-04-05 LAB — URINALYSIS, COMPLETE (UACMP) WITH MICROSCOPIC
Bilirubin Urine: NEGATIVE
Glucose, UA: NEGATIVE mg/dL
Hgb urine dipstick: NEGATIVE
Ketones, ur: 5 mg/dL — AB
Leukocytes,Ua: NEGATIVE
Nitrite: NEGATIVE
Protein, ur: 30 mg/dL — AB
Specific Gravity, Urine: 1.026 (ref 1.005–1.030)
pH: 5 (ref 5.0–8.0)

## 2021-04-05 LAB — FOLATE: Folate: 7.5 ng/mL (ref >5.9)

## 2021-04-05 LAB — CBC WITH DIFFERENTIAL (CANCER CENTER ONLY)
Abs Immature Granulocytes: 0.33 10*3/uL — ABNORMAL HIGH (ref 0.00–0.07)
Basophils Absolute: 0 10*3/uL (ref 0.0–0.1)
Basophils Relative: 1 %
Eosinophils Absolute: 0 10*3/uL (ref 0.0–0.5)
Eosinophils Relative: 0 %
HCT: 26.6 % — ABNORMAL LOW (ref 39.0–52.0)
Hemoglobin: 9 g/dL — ABNORMAL LOW (ref 13.0–17.0)
Immature Granulocytes: 8 %
Lymphocytes Relative: 12 %
Lymphs Abs: 0.5 10*3/uL — ABNORMAL LOW (ref 0.7–4.0)
MCH: 33.2 pg (ref 26.0–34.0)
MCHC: 33.8 g/dL (ref 30.0–36.0)
MCV: 98.2 fL (ref 80.0–100.0)
Monocytes Absolute: 0.3 10*3/uL (ref 0.1–1.0)
Monocytes Relative: 6 %
Neutro Abs: 3.1 10*3/uL (ref 1.7–7.7)
Neutrophils Relative %: 73 %
Platelet Count: 197 10*3/uL (ref 150–400)
RBC: 2.71 MIL/uL — ABNORMAL LOW (ref 4.22–5.81)
RDW: 14.5 % (ref 11.5–15.5)
Smear Review: NORMAL
WBC Count: 4.2 10*3/uL (ref 4.0–10.5)
nRBC: 0 % (ref 0.0–0.2)

## 2021-04-05 LAB — SEDIMENTATION RATE: Sed Rate: 89 mm/hr — ABNORMAL HIGH (ref 0–16)

## 2021-04-05 LAB — CMP (CANCER CENTER ONLY)
ALT: 22 U/L (ref 0–44)
AST: 65 U/L — ABNORMAL HIGH (ref 15–41)
Albumin: 1.7 g/dL — ABNORMAL LOW (ref 3.5–5.0)
Alkaline Phosphatase: 408 U/L — ABNORMAL HIGH (ref 38–126)
Anion gap: 6 (ref 5–15)
BUN: 10 mg/dL (ref 8–23)
CO2: 25 mmol/L (ref 22–32)
Calcium: 7.9 mg/dL — ABNORMAL LOW (ref 8.9–10.3)
Chloride: 99 mmol/L (ref 98–111)
Creatinine: 0.73 mg/dL (ref 0.61–1.24)
GFR, Estimated: >60 mL/min (ref >60)
Glucose, Bld: 89 mg/dL (ref 70–99)
Potassium: 3.6 mmol/L (ref 3.5–5.1)
Sodium: 130 mmol/L — ABNORMAL LOW (ref 135–145)
Total Bilirubin: 1.4 mg/dL — ABNORMAL HIGH (ref 0.3–1.2)
Total Protein: 7.6 g/dL (ref 6.5–8.1)

## 2021-04-05 LAB — C-REACTIVE PROTEIN: CRP: 9.3 mg/dL — ABNORMAL HIGH (ref <1.0)

## 2021-04-05 LAB — LACTATE DEHYDROGENASE: LDH: 199 U/L — ABNORMAL HIGH (ref 98–192)

## 2021-04-05 LAB — VITAMIN B12: Vitamin B-12: 1427 pg/mL — ABNORMAL HIGH (ref 180–914)

## 2021-04-05 MED ORDER — OXYCODONE HCL 5 MG PO TABS: 5.0000 mg | ORAL_TABLET | Freq: Four times a day (QID) | ORAL | 0 refills | Status: DC | PRN

## 2021-04-05 NOTE — Progress Notes (Signed)
[facility] [phone]   [phone]  INITIAL CONSULT NOTE  Patient Care Team: Pcp, No as PCP - General  Hematological/Oncological History 1) 01/21/2021: CT Abdomen/Pelvis: Right inguinal hernia containing a few loops of small bowel without definitive obstructive change. Enlarging soft tissue nodule in the left lower lobe measuring up to 1.6 cm. Multiple mesenteric lymph nodes, pre pancreatic lymph node and multiple retroperitoneal enlarged lymph nodes are noted.  Suggestion of wall thickening and mild Peri cholecystic fluid.   2) 01/22/2021: CT chest: Mild interval progression of likely infectious/inflammatory densities mostly in the left lung including persistent chronic left lung nodules with the largest nodule increasing since previous study in the left lower lobe.Trace left pleural effusion.  Small pericardial effusion.Bulky mediastinal and left hilar lymphadenopathy, similar to previous study.Evidence of chronic pancreatitis.  3) 02/16/2021: CT abdomen/pelvis:  No acute finding.  Stable from 01/21/2021.Abdominal and lower thoracic adenopathy which is new/progressive from scans in February in September of 2022. Abdominal and lower thoracic adenopathy which is new/progressive from scans in February in September of 2022. Left lower lobe nodules recently assessed by chest CT.  4) 03/15/2021: CT CAP: Extensive lymphadenopathy again noted in the chest and abdomen, highly concerning for malignancy such as lymphoma or leukemia.Overall, this is very similar to the prior examination, with measurements as detailed above.There are findings in the lungs which are suggestive of a chronic atypical infectious process. One of these nodules in the left lower lobe has become centrally cavitary compared to the prior study. New subtle area of hypoperfusion in the lateral aspect of the interpolar region of the right kidney, nonspecific, but  potentially indicative of pyelonephritis. Correlation with urinalysis is recommended. Profound asymmetric mural thickening in the urinary bladder again noted. No acute abnormality of the thoracolumbar spine to account for the patient's symptoms. Morphologic changes in the pancreas indicative of chronic pancreatitis.Aortic atherosclerosis   CHIEF COMPLAINTS/PURPOSE OF CONSULTATION:  Extensive lymphadenopathy  HISTORY OF PRESENTING ILLNESS:  Ricky [name] 66 y.o. male with medical history significant for AIDS, history of sustance abuse, cryptococcal meningitis, treated pulmonary TB and MAI (mycobacterium avium-intracellulare). He is accompanied by his brother for this visit.   On exam today, Ricky [name] reports persistent abdominal pain predominantly in the right flank and epigastric and suprapubic area.  He rates the pain as 10 out of 10 on a pain scale.  He was given a small quantity of prescription pain medication from his most recent ED visit that gave him mild relief of the pain.  He reports extreme fatigue and does not have the energy to do his normal routines.  He is sedentary often and sits in a chair most of the day.  He has experienced nausea with vomiting the last 2-3 nights.  His recent episode was earlier this morning.  He has no appetite and reports losing approximately 50 to 60 pounds in the last year.  He denies any bowel habit changes including diarrhea or constipation.  He denies easy bruising or signs of active bleeding.  Patient does have a chronic cough that is productive with white sputum.  Denies any fevers, chills, night sweats, shortness of breath or chest pain.  He has no other complaints.  Rest of the 10 point ROS is below.  MEDICAL HISTORY:  Past Medical History:  Diagnosis Date   Human immunodeficiency virus (HIV) disease ([place]) 03/26/2020    SURGICAL HISTORY: Past Surgical History:  Procedure Laterality Date   IR FL GUIDED  LOC OF NEEDLE/CATH TIP FOR SPINAL INJECTION LT   10/17/2020   SVT ABLATION N/A 04/23/2020   Procedure: SVT ABLATION;  Surgeon: Evans Lance, MD;  Location: [facility];  Service: Cardiovascular;  Laterality: N/A;    SOCIAL HISTORY: Social History   Socioeconomic History   Marital status: Married    Spouse name: Not on file   Number of children: Not on file   Years of education: Not on file   Highest education level: Not on file  Occupational History   Not on file  Tobacco Use   Smoking status: Former    Types: Cigarettes   Smokeless tobacco: Former  Substance and Sexual Activity   Alcohol use: Yes    Alcohol/week: 14.0 - 21.0 standard drinks    Types: 14 - 21 Cans of beer per week   Drug use: Not Currently   Sexual activity: Not on file  Other Topics Concern   Not on file  Social History Narrative   Not on file   Social Determinants of Health   Financial Resource Strain: Not on file  Food Insecurity: Not on file  Transportation Needs: Not on file  Physical Activity: Not on file  Stress: Not on file  Social Connections: Not on file  Intimate Partner Violence: Not on file    FAMILY HISTORY: Family History  Problem Relation Age of Onset   Hypertension Mother    Kidney disease Mother    Hypertension Sister    Hypertension Brother    Kidney cancer Brother    Colon cancer Neg Hx    Liver cancer Neg Hx    Pancreatic cancer Neg Hx    Esophageal cancer Neg Hx     ALLERGIES:  is allergic to bactrim [sulfamethoxazole-trimethoprim].  MEDICATIONS:  Current Outpatient Medications  Medication Sig Dispense Refill   azithromycin (ZITHROMAX) 500 MG tablet Take 1 tablet (500 mg total) by mouth daily. 30 tablet 11   cephALEXin (KEFLEX) 500 MG capsule Take 500 mg by mouth every 6 (six) hours.     dolutegravir (TIVICAY) 50 MG tablet Take 1 tablet (50 mg total) by mouth daily. 30 tablet 11   emtricitabine-tenofovir (TRUVADA) 200-300 MG tablet Take 1 tablet by mouth daily. 30 tablet 11   ethambutol (MYAMBUTOL) 400  MG tablet Take 2.5 tablets (1,000 mg total) by mouth daily. 75 tablet 11   fluconazole (DIFLUCAN) 200 MG tablet Take 1 tablet (200 mg total) by mouth daily. 30 tablet 11   oxyCODONE (OXY IR/ROXICODONE) 5 MG immediate release tablet Take 1-2 tablets (5-10 mg total) by mouth every 6 (six) hours as needed for severe pain. 90 tablet 0   rifabutin (MYCOBUTIN) 150 MG capsule Take 2 capsules (300 mg total) by mouth daily. 60 capsule 11   sulfamethoxazole-trimethoprim (BACTRIM) 400-80 MG tablet Take 1 tablet by mouth daily. 30 tablet 11   acetaminophen (TYLENOL) 500 MG tablet Take 500 mg by mouth every 6 (six) hours as needed for moderate pain or headache. (Patient not taking: Reported on 04/04/2021)     fluticasone (FLONASE) 50 MCG/ACT nasal spray Place 1 spray into both nostrils as needed for allergies or rhinitis. (Patient not taking: Reported on 83/66/2947) 16 g 2   folic acid (FOLVITE) 1 MG tablet Take 1 tablet (1 mg total) by mouth daily. (Patient not taking: Reported on 04/04/2021) 90 tablet 0   ondansetron (ZOFRAN-ODT) 4 MG disintegrating tablet Take 1 tablet (4 mg total) by mouth every 8 (eight) hours as needed for  nausea or vomiting. (Patient not taking: Reported on 04/04/2021) 10 tablet 0   pantoprazole (PROTONIX) 20 MG tablet Take 1 tablet (20 mg total) by mouth daily. (Patient not taking: Reported on 04/04/2021) 30 tablet 0   No current facility-administered medications for this visit.    REVIEW OF SYSTEMS:   Constitutional: ( - ) fevers, ( - )  chills , ( - ) night sweats Eyes: ( - ) blurriness of vision, ( - ) double vision, ( - ) watery eyes Ears, nose, mouth, throat, and face: ( - ) mucositis, ( - ) sore throat Respiratory: ( + ) cough, ( - ) dyspnea, ( - ) wheezes Cardiovascular: ( - ) palpitation, ( - ) chest discomfort, ( - ) lower extremity swelling Gastrointestinal:  ( + ) nausea, ( - ) heartburn, ( - ) change in bowel habits Skin: ( - ) abnormal skin rashes Lymphatics: ( - ) new  lymphadenopathy, ( - ) easy bruising Neurological: ( - ) numbness, ( - ) tingling, ( - ) new weaknesses Behavioral/Psych: ( - ) mood change, ( - ) new changes  All other systems were reviewed with the patient and are negative.  PHYSICAL EXAMINATION: ECOG PERFORMANCE STATUS: 2 - Symptomatic, <50% confined to bed  Vitals:   04/05/21 1305  BP: 114/81  Pulse: 100  Resp: 18  Temp: 98.1 F (36.7 C)  SpO2: 100%   Filed Weights   04/05/21 1305  Weight: 147 lb 1.6 oz (66.7 kg)    GENERAL: thin appearing male in NAD  SKIN: skin color, texture, turgor are normal, no rashes or significant lesions EYES: conjunctiva are pink and non-injected, sclera clear OROPHARYNX: no exudate, no erythema; lips, buccal mucosa, and tongue normal  LYMPH:  no palpable lymphadenopathy in the cervical, axillary or supraclavicular lymph nodes.  LUNGS: clear to auscultation and percussion with normal breathing effort HEART: regular rate & rhythm and no murmurs and no lower extremity edema ABDOMEN: soft, non-distended. Tenderness to palpation of suprapubic region and right flank. Reducible right inguinal hernia, nontender.  Musculoskeletal: no cyanosis of digits and no clubbing  PSYCH: alert & oriented x 3, fluent speech NEURO: no focal motor/sensory deficits  LABORATORY DATA:  I have reviewed the data as listed CBC Latest Ref Rng & Units 04/05/2021 04/04/2021 03/23/2021  WBC 4.0 - 10.5 K/uL 4.2 4.4 3.6(L)  Hemoglobin 13.0 - 17.0 g/dL 9.0(L) 8.6(L) 9.6(L)  Hematocrit 39.0 - 52.0 % 26.6(L) 24.0(L) 27.4(L)  Platelets 150 - 400 K/uL 197 239 207    CMP Latest Ref Rng & Units 04/05/2021 04/04/2021 03/23/2021  Glucose 70 - 99 mg/dL 89 89 89  BUN 8 - 23 mg/dL 10 7 5(L)  Creatinine 0.61 - 1.24 mg/dL 0.73 0.68(L) 0.69  Sodium 135 - 145 mmol/L 130(L) 130(L) 126(L)  Potassium 3.5 - 5.1 mmol/L 3.6 3.5 3.5  Chloride 98 - 111 mmol/L 99 98 96(L)  CO2 22 - 32 mmol/L 25 25 22   Calcium 8.9 - 10.3 mg/dL 7.9(L) 7.3(L) 7.7(L)   Total Protein 6.5 - 8.1 g/dL 7.6 6.6 7.4  Total Bilirubin 0.3 - 1.2 mg/dL 1.4(H) 1.5(H) 0.6  Alkaline Phos 38 - 126 U/L 408(H) - 419(H)  AST 15 - 41 U/L 65(H) 58(H) 69(H)  ALT 0 - 44 U/L 22 17 19     RADIOGRAPHIC STUDIES: I have personally reviewed the radiological images as listed and agreed with the findings in the report. CT CHEST ABDOMEN PELVIS W CONTRAST  Result Date: 03/15/2021 CLINICAL DATA:  66 year old male with history of diffuse body pain for 1 month, most severe in the back. Lymphadenopathy. EXAM: CT CHEST, ABDOMEN, AND PELVIS WITH CONTRAST CT THORACIC SPINE WITHOUT CONTRAST CT LUMBAR SPINE WITHOUT CONTRAST TECHNIQUE: Multidetector CT imaging of the chest, abdomen and pelvis was performed following the standard protocol during bolus administration of intravenous contrast. Multidetector CT imaging of the thoracic spine was performed without intravenous contrast administration. Multiplanar CT image reconstructions were also generated. Multidetector CT imaging of the lumbar spine was performed without intravenous contrast administration. Multiplanar CT image reconstructions were also generated. RADIATION DOSE REDUCTION: This exam was performed according to the departmental dose-optimization program which includes automated exposure control, adjustment of the mA and/or kV according to patient size and/or use of iterative reconstruction technique. CONTRAST:  15mL OMNIPAQUE IOHEXOL 300 MG/ML  SOLN COMPARISON:  CT the abdomen and pelvis 02/16/2021. CT the chest 01/22/2021. FINDINGS: CT CHEST FINDINGS Cardiovascular: Heart size is normal. Trace amount of pericardial fluid and/or thickening, unlikely to be of any hemodynamic significance at this time. No pericardial calcification. Atherosclerotic calcifications are noted in the thoracic aorta. No definite coronary artery calcifications are noted. Mediastinum/Nodes: Extensive mediastinal and hilar lymphadenopathy is again noted, similar to the prior  study. The largest lymph node on today's examination is in the subcarinal nodal station measuring up to 3.3 cm in short axis (axial image 34 of series 3), with some internal areas of low attenuation which may reflect internal necrosis. Largest hilar lymph node is on the left measuring 1.8 cm in short axis (axial image 27 of series 3). Esophagus is unremarkable in appearance. No axillary lymphadenopathy. Lungs/Pleura: Extensive cylindrical and varicose bronchiectasis is again noted throughout the left lung, most apparent in the left upper lobe. Multiple pulmonary nodules are again noted, relatively similar to the prior study. The majority of these are clustered in the left lung, solid in appearance, and measuring up to 1.5 x 1.1 cm in the medial aspect of the left upper lobe (axial image 35 of series 5). In addition, there is a nodule in the anterior aspect of the left lower lobe near the lung base (axial image 114 of series 5) which is slightly smaller than the prior examination, but now demonstrates some internal cavitation and thick walls, currently measuring 1.8 x 1.6 cm. Trace amount of left pleural fluid/thickening, decreased compared to the prior study. No right pleural effusion. Musculoskeletal: No acute abnormality of the thoracic spine. There are no aggressive appearing lytic or blastic lesions noted in the visualized portions of the skeleton. CT ABDOMEN PELVIS FINDINGS Hepatobiliary: No suspicious cystic or solid hepatic lesions. No intra or extrahepatic biliary ductal dilatation. Periportal edema is noted. Gallbladder is not distended. No calcified gallstones are noted. Gallbladder wall appears edematous, and is surrounded by a small volume of ascites (nonspecific). Pancreas: Innumerable coarse calcifications are noted throughout the pancreas, indicative of chronic pancreatitis. No discrete pancreatic mass. No pancreatic ductal dilatation. No focal peripancreatic fluid collections or definite inflammatory  changes. Spleen: Unremarkable. Adrenals/Urinary Tract: Cortical atrophy in the lower pole the left kidney again noted. Subtle area of hypoenhancement in the interpolar region of the right kidney (axial image 77 of series 3), new compared to the prior examination. Bilateral adrenal glands are normal in appearance. No hydroureteronephrosis. Profound asymmetric thickening of the wall of the urinary bladder most severe anterolaterally on the right where this measures up to 1.4 cm in thickness. Stomach/Bowel: The appearance of the stomach is normal. There is no pathologic dilatation of small  bowel or colon. Normal appendix. Vascular/Lymphatic: Aortic atherosclerosis, without evidence of aneurysm or dissection in the abdominal or pelvic vasculature. Extensive lymphadenopathy again noted throughout the abdomen, most evident in the retroperitoneum where the largest nodal mass measures 3.2 x 3.9 cm in the left para-aortic nodal station (axial image 74 of series 3). Reproductive: Prostate gland and seminal vesicles are unremarkable in appearance. Other: Small volume of ascites.  No pneumoperitoneum. Musculoskeletal: No acute abnormality of the lumbar spine. There are no aggressive appearing lytic or blastic lesions noted in the visualized portions of the skeleton. IMPRESSION: 1. Extensive lymphadenopathy again noted in the chest and abdomen, highly concerning for malignancy such as lymphoma or leukemia. Overall, this is very similar to the prior examination, with measurements as detailed above. 2. There are findings in the lungs which are suggestive of a chronic atypical infectious process. Further evaluation by Pulmonology and/or infectious disease is recommended. One of these nodules in the left lower lobe has become centrally cavitary compared to the prior study. Neoplasm is not entirely excluded, but not strongly favored at this time. 3. New subtle area of hypoperfusion in the lateral aspect of the interpolar region of  the right kidney, nonspecific, but potentially indicative of pyelonephritis. Correlation with urinalysis is recommended. 4. Profound asymmetric mural thickening in the urinary bladder again noted. Urologic consultation is recommended to exclude the possibility of infiltrative bladder wall neoplasm. 5. No acute abnormality of the thoracolumbar spine to account for the patient's symptoms. 6. Small volume of ascites. 7. Morphologic changes in the pancreas indicative of chronic pancreatitis. 8. Aortic atherosclerosis. 9. Additional incidental findings, as above. Electronically Signed   By: Vinnie Langton M.D.   On: 03/15/2021 07:38   CT T-SPINE NO CHARGE  Result Date: 03/15/2021 CLINICAL DATA:  66 year old male with history of diffuse body pain for 1 month, most severe in the back. Lymphadenopathy. EXAM: CT CHEST, ABDOMEN, AND PELVIS WITH CONTRAST CT THORACIC SPINE WITHOUT CONTRAST CT LUMBAR SPINE WITHOUT CONTRAST TECHNIQUE: Multidetector CT imaging of the chest, abdomen and pelvis was performed following the standard protocol during bolus administration of intravenous contrast. Multidetector CT imaging of the thoracic spine was performed without intravenous contrast administration. Multiplanar CT image reconstructions were also generated. Multidetector CT imaging of the lumbar spine was performed without intravenous contrast administration. Multiplanar CT image reconstructions were also generated. RADIATION DOSE REDUCTION: This exam was performed according to the departmental dose-optimization program which includes automated exposure control, adjustment of the mA and/or kV according to patient size and/or use of iterative reconstruction technique. CONTRAST:  23mL OMNIPAQUE IOHEXOL 300 MG/ML  SOLN COMPARISON:  CT the abdomen and pelvis 02/16/2021. CT the chest 01/22/2021. FINDINGS: CT CHEST FINDINGS Cardiovascular: Heart size is normal. Trace amount of pericardial fluid and/or thickening, unlikely to be of any  hemodynamic significance at this time. No pericardial calcification. Atherosclerotic calcifications are noted in the thoracic aorta. No definite coronary artery calcifications are noted. Mediastinum/Nodes: Extensive mediastinal and hilar lymphadenopathy is again noted, similar to the prior study. The largest lymph node on today's examination is in the subcarinal nodal station measuring up to 3.3 cm in short axis (axial image 34 of series 3), with some internal areas of low attenuation which may reflect internal necrosis. Largest hilar lymph node is on the left measuring 1.8 cm in short axis (axial image 27 of series 3). Esophagus is unremarkable in appearance. No axillary lymphadenopathy. Lungs/Pleura: Extensive cylindrical and varicose bronchiectasis is again noted throughout the left lung, most apparent in the  left upper lobe. Multiple pulmonary nodules are again noted, relatively similar to the prior study. The majority of these are clustered in the left lung, solid in appearance, and measuring up to 1.5 x 1.1 cm in the medial aspect of the left upper lobe (axial image 35 of series 5). In addition, there is a nodule in the anterior aspect of the left lower lobe near the lung base (axial image 114 of series 5) which is slightly smaller than the prior examination, but now demonstrates some internal cavitation and thick walls, currently measuring 1.8 x 1.6 cm. Trace amount of left pleural fluid/thickening, decreased compared to the prior study. No right pleural effusion. Musculoskeletal: No acute abnormality of the thoracic spine. There are no aggressive appearing lytic or blastic lesions noted in the visualized portions of the skeleton. CT ABDOMEN PELVIS FINDINGS Hepatobiliary: No suspicious cystic or solid hepatic lesions. No intra or extrahepatic biliary ductal dilatation. Periportal edema is noted. Gallbladder is not distended. No calcified gallstones are noted. Gallbladder wall appears edematous, and is  surrounded by a small volume of ascites (nonspecific). Pancreas: Innumerable coarse calcifications are noted throughout the pancreas, indicative of chronic pancreatitis. No discrete pancreatic mass. No pancreatic ductal dilatation. No focal peripancreatic fluid collections or definite inflammatory changes. Spleen: Unremarkable. Adrenals/Urinary Tract: Cortical atrophy in the lower pole the left kidney again noted. Subtle area of hypoenhancement in the interpolar region of the right kidney (axial image 77 of series 3), new compared to the prior examination. Bilateral adrenal glands are normal in appearance. No hydroureteronephrosis. Profound asymmetric thickening of the wall of the urinary bladder most severe anterolaterally on the right where this measures up to 1.4 cm in thickness. Stomach/Bowel: The appearance of the stomach is normal. There is no pathologic dilatation of small bowel or colon. Normal appendix. Vascular/Lymphatic: Aortic atherosclerosis, without evidence of aneurysm or dissection in the abdominal or pelvic vasculature. Extensive lymphadenopathy again noted throughout the abdomen, most evident in the retroperitoneum where the largest nodal mass measures 3.2 x 3.9 cm in the left para-aortic nodal station (axial image 74 of series 3). Reproductive: Prostate gland and seminal vesicles are unremarkable in appearance. Other: Small volume of ascites.  No pneumoperitoneum. Musculoskeletal: No acute abnormality of the lumbar spine. There are no aggressive appearing lytic or blastic lesions noted in the visualized portions of the skeleton. IMPRESSION: 1. Extensive lymphadenopathy again noted in the chest and abdomen, highly concerning for malignancy such as lymphoma or leukemia. Overall, this is very similar to the prior examination, with measurements as detailed above. 2. There are findings in the lungs which are suggestive of a chronic atypical infectious process. Further evaluation by Pulmonology and/or  infectious disease is recommended. One of these nodules in the left lower lobe has become centrally cavitary compared to the prior study. Neoplasm is not entirely excluded, but not strongly favored at this time. 3. New subtle area of hypoperfusion in the lateral aspect of the interpolar region of the right kidney, nonspecific, but potentially indicative of pyelonephritis. Correlation with urinalysis is recommended. 4. Profound asymmetric mural thickening in the urinary bladder again noted. Urologic consultation is recommended to exclude the possibility of infiltrative bladder wall neoplasm. 5. No acute abnormality of the thoracolumbar spine to account for the patient's symptoms. 6. Small volume of ascites. 7. Morphologic changes in the pancreas indicative of chronic pancreatitis. 8. Aortic atherosclerosis. 9. Additional incidental findings, as above. Electronically Signed   By: Vinnie Langton M.D.   On: 03/15/2021 07:38   CT  L-SPINE NO CHARGE  Result Date: 03/15/2021 CLINICAL DATA:  66 year old male with history of diffuse body pain for 1 month, most severe in the back. Lymphadenopathy. EXAM: CT CHEST, ABDOMEN, AND PELVIS WITH CONTRAST CT THORACIC SPINE WITHOUT CONTRAST CT LUMBAR SPINE WITHOUT CONTRAST TECHNIQUE: Multidetector CT imaging of the chest, abdomen and pelvis was performed following the standard protocol during bolus administration of intravenous contrast. Multidetector CT imaging of the thoracic spine was performed without intravenous contrast administration. Multiplanar CT image reconstructions were also generated. Multidetector CT imaging of the lumbar spine was performed without intravenous contrast administration. Multiplanar CT image reconstructions were also generated. RADIATION DOSE REDUCTION: This exam was performed according to the departmental dose-optimization program which includes automated exposure control, adjustment of the mA and/or kV according to patient size and/or use of  iterative reconstruction technique. CONTRAST:  17mL OMNIPAQUE IOHEXOL 300 MG/ML  SOLN COMPARISON:  CT the abdomen and pelvis 02/16/2021. CT the chest 01/22/2021. FINDINGS: CT CHEST FINDINGS Cardiovascular: Heart size is normal. Trace amount of pericardial fluid and/or thickening, unlikely to be of any hemodynamic significance at this time. No pericardial calcification. Atherosclerotic calcifications are noted in the thoracic aorta. No definite coronary artery calcifications are noted. Mediastinum/Nodes: Extensive mediastinal and hilar lymphadenopathy is again noted, similar to the prior study. The largest lymph node on today's examination is in the subcarinal nodal station measuring up to 3.3 cm in short axis (axial image 34 of series 3), with some internal areas of low attenuation which may reflect internal necrosis. Largest hilar lymph node is on the left measuring 1.8 cm in short axis (axial image 27 of series 3). Esophagus is unremarkable in appearance. No axillary lymphadenopathy. Lungs/Pleura: Extensive cylindrical and varicose bronchiectasis is again noted throughout the left lung, most apparent in the left upper lobe. Multiple pulmonary nodules are again noted, relatively similar to the prior study. The majority of these are clustered in the left lung, solid in appearance, and measuring up to 1.5 x 1.1 cm in the medial aspect of the left upper lobe (axial image 35 of series 5). In addition, there is a nodule in the anterior aspect of the left lower lobe near the lung base (axial image 114 of series 5) which is slightly smaller than the prior examination, but now demonstrates some internal cavitation and thick walls, currently measuring 1.8 x 1.6 cm. Trace amount of left pleural fluid/thickening, decreased compared to the prior study. No right pleural effusion. Musculoskeletal: No acute abnormality of the thoracic spine. There are no aggressive appearing lytic or blastic lesions noted in the visualized  portions of the skeleton. CT ABDOMEN PELVIS FINDINGS Hepatobiliary: No suspicious cystic or solid hepatic lesions. No intra or extrahepatic biliary ductal dilatation. Periportal edema is noted. Gallbladder is not distended. No calcified gallstones are noted. Gallbladder wall appears edematous, and is surrounded by a small volume of ascites (nonspecific). Pancreas: Innumerable coarse calcifications are noted throughout the pancreas, indicative of chronic pancreatitis. No discrete pancreatic mass. No pancreatic ductal dilatation. No focal peripancreatic fluid collections or definite inflammatory changes. Spleen: Unremarkable. Adrenals/Urinary Tract: Cortical atrophy in the lower pole the left kidney again noted. Subtle area of hypoenhancement in the interpolar region of the right kidney (axial image 77 of series 3), new compared to the prior examination. Bilateral adrenal glands are normal in appearance. No hydroureteronephrosis. Profound asymmetric thickening of the wall of the urinary bladder most severe anterolaterally on the right where this measures up to 1.4 cm in thickness. Stomach/Bowel: The appearance of the  stomach is normal. There is no pathologic dilatation of small bowel or colon. Normal appendix. Vascular/Lymphatic: Aortic atherosclerosis, without evidence of aneurysm or dissection in the abdominal or pelvic vasculature. Extensive lymphadenopathy again noted throughout the abdomen, most evident in the retroperitoneum where the largest nodal mass measures 3.2 x 3.9 cm in the left para-aortic nodal station (axial image 74 of series 3). Reproductive: Prostate gland and seminal vesicles are unremarkable in appearance. Other: Small volume of ascites.  No pneumoperitoneum. Musculoskeletal: No acute abnormality of the lumbar spine. There are no aggressive appearing lytic or blastic lesions noted in the visualized portions of the skeleton. IMPRESSION: 1. Extensive lymphadenopathy again noted in the chest and  abdomen, highly concerning for malignancy such as lymphoma or leukemia. Overall, this is very similar to the prior examination, with measurements as detailed above. 2. There are findings in the lungs which are suggestive of a chronic atypical infectious process. Further evaluation by Pulmonology and/or infectious disease is recommended. One of these nodules in the left lower lobe has become centrally cavitary compared to the prior study. Neoplasm is not entirely excluded, but not strongly favored at this time. 3. New subtle area of hypoperfusion in the lateral aspect of the interpolar region of the right kidney, nonspecific, but potentially indicative of pyelonephritis. Correlation with urinalysis is recommended. 4. Profound asymmetric mural thickening in the urinary bladder again noted. Urologic consultation is recommended to exclude the possibility of infiltrative bladder wall neoplasm. 5. No acute abnormality of the thoracolumbar spine to account for the patient's symptoms. 6. Small volume of ascites. 7. Morphologic changes in the pancreas indicative of chronic pancreatitis. 8. Aortic atherosclerosis. 9. Additional incidental findings, as above. Electronically Signed   By: Vinnie Langton M.D.   On: 03/15/2021 07:38    ASSESSMENT & PLAN Ricky [name] is a 66 y.o. male presents to the diagnostic clinic for evaluation for extensive lymphadenopathy that was seen on recent CT imaging from February 2023.  We reviewed possible etiologies for lymphadenopathy including infectious process, inflammatory process and lymphoproliferative disorders.  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 However, we recommend tissue sampling to confirm etiology. Upon review of CT imaging, we will request a EBUS with biopsy of one of the mediastinal/hilar lymph nodes. Additionally, patient will proceed with serologic workup today.    #Diffuse lymphadenopathy: --Labs today to check  CBC, CMP, LDH, sedimentation rate, and c-reactive protein --Request EBUS with FNA of mediastinal/hilar lymphadenopathy  #Normocytic Anemia: --Etiology unknown --Labs today to check iron panel, vitamin B12, MMA and folate levels  #Bladder wall thickening/hypoperfusion in right kidney: --Possible infectious process as patient is also complaining of suprapubic pain and right flank pain --Labs today to check UA and urine culture --Referral to urology  #Abdominal pain: --Currently taking Tylenol PRN with no relief of pain --Sent oxycodone 5 mg with directions to take 1-2 tablets PO every 6 hours PRN  #Nausea/vomiting: --Etiology  unknown --Recommend to take recently prescribed Zofran 4 mg once every 8 hours PRN  Follow up : --RTC once workup is complete  Orders Placed This Encounter  Procedures   Culture, Urine    Standing Status:   Future    Number of Occurrences:   1    Standing Expiration Date:   04/05/2022   CBC with Differential ([facility] Only)    Standing Status:   Future    Number of Occurrences:   1    Standing Expiration Date:  04/06/2022   CMP ([facility] only)    Standing Status:   Future    Number of Occurrences:   1    Standing Expiration Date:   04/06/2022   Lactate dehydrogenase (LDH)    Standing Status:   Future    Number of Occurrences:   1    Standing Expiration Date:   04/05/2022   Vitamin B12    Standing Status:   Future    Number of Occurrences:   1    Standing Expiration Date:   04/05/2022   Methylmalonic acid, serum    Standing Status:   Future    Number of Occurrences:   1    Standing Expiration Date:   04/05/2022   Folate, Serum    Standing Status:   Future    Number of Occurrences:   1    Standing Expiration Date:   04/05/2022   Ferritin    Standing Status:   Future    Number of Occurrences:   1    Standing Expiration Date:   04/06/2022   Iron and Iron Binding Capacity ([facility] only)    Standing Status:   Future    Number of Occurrences:   1     Standing Expiration Date:   04/06/2022   Urinalysis, Complete w Microscopic    Standing Status:   Future    Number of Occurrences:   1    Standing Expiration Date:   04/06/2022   Sedimentation rate    Standing Status:   Future    Number of Occurrences:   1    Standing Expiration Date:   04/05/2022   C-reactive protein    Standing Status:   Future    Number of Occurrences:   1    Standing Expiration Date:   04/05/2022   Ambulatory referral to Urology    Referral Priority:   Urgent    Referral Type:   Consultation    Referral Reason:   Specialty Services Required    Requested Specialty:   Urology    Number of Visits Requested:   1    All questions were answered. The patient knows to call the clinic with any problems, questions or concerns.  I have spent a total of 60 minutes minutes of face-to-face and non-face-to-face time, preparing to see the patient, obtaining and/or reviewing separately obtained history, performing a medically appropriate examination, counseling and educating the patient, ordering medications/tests/procedures, referring and communicating with other health care professionals, documenting clinical information in the electronic health record, and care coordination.   Dede Query, PA-C Department of Hematology/Oncology [place] [facility] Phone: [phone]  Patient was seen with Dr. Lorenso Courier.   I have read the above note and personally examined the patient. I agree with the assessment and plan as noted above.  Briefly Ricky [name] is a 66 year old male with medical history significant for HIV who presents for evaluation of diffuse lymphadenopathy.  The etiology of his lymphadenopathy is unclear though differential includes inflammatory process, HIV itself, or possible lymphoproliferative disorder.  At this time would recommend a thorough work-up to include inflammatory markers as well as consideration of a bronchoscopy guided biopsy.   Patient additionally has anemia which may be due to chronic disease or nutritional deficiency which we will work-up further.  Due to thickening of the bladder we will order UA, urine culture, and consider referral to urology for further evaluation.  We will have the patient return to  clinic pending the results of his extensive above work-up.   Ledell Peoples, MD Department of Hematology/Oncology [place] [facility] Phone: [phone] Pager: [phone] Email: [email or web address]

## 2021-04-07 LAB — URINE CULTURE: Culture: 10000 — AB

## 2021-04-08 ENCOUNTER — Other Ambulatory Visit: Payer: Self-pay | Admitting: Physician Assistant

## 2021-04-08 ENCOUNTER — Telehealth: Payer: Self-pay

## 2021-04-08 ENCOUNTER — Other Ambulatory Visit (HOSPITAL_COMMUNITY): Payer: Self-pay

## 2021-04-08 DIAGNOSIS — N3289 Other specified disorders of bladder: Secondary | ICD-10-CM | POA: Insufficient documentation

## 2021-04-08 DIAGNOSIS — D649 Anemia, unspecified: Secondary | ICD-10-CM | POA: Insufficient documentation

## 2021-04-08 DIAGNOSIS — R112 Nausea with vomiting, unspecified: Secondary | ICD-10-CM | POA: Insufficient documentation

## 2021-04-08 DIAGNOSIS — R109 Unspecified abdominal pain: Secondary | ICD-10-CM | POA: Insufficient documentation

## 2021-04-08 LAB — FERRITIN: Ferritin: 1782 ng/mL — ABNORMAL HIGH (ref 24–336)

## 2021-04-08 LAB — METHYLMALONIC ACID, SERUM: Methylmalonic Acid, Quantitative: 203 nmol/L (ref 0–378)

## 2021-04-08 NOTE — Telephone Encounter (Signed)
STAT referral faxed to [facility].  Confirmation received. ?

## 2021-04-08 NOTE — Telephone Encounter (Signed)
Called to get authorization for CT CHEST, ABDOMEN AND PELVIS W CONTRAST. Called [facility] [place] [phone] and was then sent to Clinical Review [phone].  ?______________________________________________________________________________________ ?Provider Prudencio Pair   ?NPI [id number] ? ?Procedure code [id number] (Chest)  ?     74177(PELV/ABD) ?ICD10 :             R10.13 Epigastric Pain ? ? ?Scheduled:  04/11/2021  ? ?Facility: [facility]  ?[address] [place] [address] ?NPI: [id number] ? ?Outcome: PENDING  ?Requires Provider to call [phone] for Peer to Peer Review.  ? ?Sent to Dr Gale Journey to initiate the Peer to Peer Review as this will be pending until call is completed. Also sent notice to [facility] and noted in Referral Notes.  ?

## 2021-04-09 ENCOUNTER — Other Ambulatory Visit (HOSPITAL_COMMUNITY): Payer: Self-pay

## 2021-04-09 ENCOUNTER — Other Ambulatory Visit: Payer: Self-pay

## 2021-04-09 ENCOUNTER — Telehealth: Payer: Self-pay

## 2021-04-09 LAB — AFB ORGANISM ID BY DNA PROBE
M avium complex: POSITIVE — AB
M tuberculosis complex: NEGATIVE

## 2021-04-09 LAB — MAC SUSCEPTIBILITY BROTH
Ciprofloxacin: >8
Clarithromycin: 4
Doxycycline: >8
Linezolid: >32
Minocycline: >8
Rifabutin: 0.25
Rifampin: 4
Streptomycin: 32

## 2021-04-09 LAB — ACID FAST SMEAR (AFB, MYCOBACTERIA)

## 2021-04-09 LAB — ACID FAST CULTURE WITH REFLEXED SENSITIVITIES (MYCOBACTERIA): Acid Fast Culture: POSITIVE — AB

## 2021-04-09 NOTE — Telephone Encounter (Signed)
[facility] Patient Advocate Encounter ? ?Patient's medications have been couriered to [facility] from [facility] and will be picked up . ? ?Patients other three medications (Azithromycin, Truvada, Ethambutol) was filled at [id number] on 03/02 and patient picked up meds. ? ?Ileene Patrick , CPhT ?Specialty Pharmacy Patient Advocate ?[facility] ?Phone: [phone] ?Fax:  [phone]  ?

## 2021-04-10 NOTE — Telephone Encounter (Signed)
I spoke to patient's sister advised her of patient information. Per patient's sister [name] she will be coming with the patient to his appointment and will be coming by a cab. I advised her patient should be there by 645 am. I have also emailed preparation instructions with to the patient's sister as well. ?

## 2021-04-10 NOTE — Telephone Encounter (Signed)
Followed up today to ensure pt has picked up prep instructions. Unfortunately, pt nor family has stopped by our office to pick up prep instructions per our conversation on 04/04/21. Called pt to inquire if his brother was still planning to stop by our office to pick up his prep instructions for his procedure scheduled on 04/12/21. Pt is unable to recall conversation had with he and his sister on 04/04/21. In addition, pt claims he does not have a caregiver nor transportation to his procedure on 04/12/21. Further added, "my brother is going through stuff too". It was my understanding after 04/04/21 conversation that the pt brother would be transporting and serving as caregiver for pt on 04/12/21. Pt proceeded to ask if I would arrange transportation for his EGD. Advised this not a service we provide nor offer. However, it is likely his ID provider may have access to a social worker who might be able to assist with this request. Advised I will be calling Dr. Gale Journey to make him aware of this conversation/concerns. Verbalized understanding. Asked pt if he consents to my calling his brother to discuss this matter further directly with his brother. Pt agreed and provided the following contact info: ? ?Laqueta Carina, contact [phone] ? ?Immediately called brother per pt request and per our discussion. LVM asking for returned call. Immediately upon ending this call, called Dr. Hart Rochester office to inform about my concerns of pt confusion re: scheduled EGD, failure to pick up prep instructions ina timely manner and about concerns with transportation/caregiver requirements. Receptionist advised she will inform Dr. Gale Journey and his assistant about these concerns in addition to my sending this message to care team. ?

## 2021-04-11 ENCOUNTER — Institutional Professional Consult (permissible substitution): Payer: Medicaid Other | Admitting: Pulmonary Disease

## 2021-04-11 ENCOUNTER — Ambulatory Visit (HOSPITAL_COMMUNITY): Payer: Medicaid Other

## 2021-04-12 ENCOUNTER — Encounter: Payer: Medicaid Other | Admitting: Gastroenterology

## 2021-04-12 ENCOUNTER — Telehealth: Payer: Self-pay

## 2021-04-12 NOTE — Telephone Encounter (Signed)
04/23/21 procedure has been canceled at Dr. Hart Rochester and Dr. Tarri Glenn' request. ?

## 2021-04-12 NOTE — Telephone Encounter (Signed)
Patients wife called the oncall MD in regards to transportation. After speaking to Dr. Loletha Carrow, I called and spoke with the patient wife and she stated that the transportation driver left their residence before they could get outside. Because of overbooked MD schedule for today MD wanted to reschedule to give patient more time to set up transportation. Rescheduled patient to March 21st at 10am. Pt is requesting that we call her in regards to helping her get transportation set up.  ?

## 2021-04-12 NOTE — Telephone Encounter (Signed)
-----   Message from Thornton Park, MD sent at 04/12/2021 10:14 AM EST ----- ?Regarding: RE: Procedure no show for today ?Sounds like a plan. ? ?Thanks. ? ?KLB ?----- Message ----- ?From: Aleatha Borer, LPN ?Sent: 04/12/2021  10:08 AM EST ?To: Thornton Park, MD, Jabier Mutton, MD, # ?Subject: RE: Procedure no show for today               ? ?At your request and the request of Dr. Tarri Glenn, procedure has been canceled. ? ?Thank you ?----- Message ----- ?From: Jabier Mutton, MD ?Sent: 04/12/2021   9:39 AM EST ?To: Aleatha Borer, LPN, Thornton Park, MD, # ?Subject: RE: Procedure no show for today               ? ?Again I really appreciate you and your clinic trying to get him in ASAP. I presume being this sick he/his family would be more responsive to getting diagnosis/meds  ? ?At this time he is still being prepped for lymph node biopsy.  ? ?I worry if we push too many procedures we might not get the highest yield done, which is the biopsy. Ideally I would want all at this time but it is rather difficult ? ?Let's hold on the egd ? ?I will have my clinic staff let him know to await the biopsy of his node for now.  ? ? ?Thank you again ?(Team [facility] please let him know egd cancelled, and he should follow up with the lymph node biopsy appointment) ? ?[place] ?----- Message ----- ?From: Thornton Park, MD ?Sent: 04/12/2021   8:35 AM EST ?To: Jabier Mutton, MD ?Subject: RE: Procedure no show for today               ? ?Not sure how else to help him get here. But, we will see if he can come later in the month. ? ?KLB ?----- Message ----- ?From: Jabier Mutton, MD ?Sent: 04/12/2021   8:04 AM EST ?To: Thornton Park, MD ?Subject: RE: Procedure no show for today               ? ?Thanks Kim ? ?We tried ? ?I am sorry for all the trouble your staffs had gone through trying to get him in ?Johnny Bridge ?----- Message ----- ?From: Thornton Park, MD ?Sent: 04/12/2021   7:14 AM EST ?To: Aleatha Borer, LPN, Jabier Mutton, MD ?Subject: Procedure no  show for today                   ? ?Mr. Dolloff did not keep his appointment for his EGD this morning.  Apparently the driver called him when they arrived but left before he got outside to the vehicle.  ? ?He was rescheduled for the EGD later in the month but it sounds like he will again need help with transportation. ? ?Thank you. ? ?KLB ? ? ? ? ?

## 2021-04-12 NOTE — Telephone Encounter (Signed)
Following message has been sent to Dr. Gale Journey and his staff: ? ?As previously stated in secure chat, please ensure transportation is arranged and that the pt arrives by no later that 9am. Appt info attached in secure chat but also can be seen in [place]. In addition, pt must have caregiver who must remain on site throughout the entire visit. Should pt and family not comply to [facility] requirements, procedure will result in another cancellation.  ? ?Thank you  ?

## 2021-04-16 ENCOUNTER — Ambulatory Visit (INDEPENDENT_AMBULATORY_CARE_PROVIDER_SITE_OTHER): Payer: Medicaid Other | Admitting: Pharmacist

## 2021-04-16 ENCOUNTER — Other Ambulatory Visit (HOSPITAL_COMMUNITY): Payer: Self-pay

## 2021-04-16 ENCOUNTER — Other Ambulatory Visit: Payer: Self-pay

## 2021-04-16 DIAGNOSIS — R630 Anorexia: Secondary | ICD-10-CM

## 2021-04-16 MED ORDER — MIRTAZAPINE 15 MG PO TABS
15.0000 mg | ORAL_TABLET | Freq: Every day | ORAL | 1 refills | Status: DC
  Filled 2021-04-16 – 2021-05-08 (×2): qty 30, 30d supply, fill #0

## 2021-04-16 NOTE — Progress Notes (Addendum)
? ?HPI: Ricky [name] is a 66 y.o. male who presents to the pharmacy clinic to reestablish care for his HIV infection. ? ?Patient Active Problem List  ? Diagnosis Date Noted  ? Bladder wall thickening 04/08/2021  ? Abdominal pain 04/08/2021  ? Normocytic anemia 04/08/2021  ? Nausea with vomiting 04/08/2021  ? Fever   ? Acute cholecystitis 01/22/2021  ? Complicated UTI (urinary tract infection) 01/22/2021  ? Noncompliance 01/22/2021  ? Lymphadenopathy   ? Chronic low back pain   ? HIV infection ([place])   ? Community acquired pneumonia   ? Nonadherence to medical treatment   ? COVID-19 virus infection 10/13/2020  ? Alcohol abuse, daily use   ? COVID-19 10/12/2020  ? 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 Blurry vision, left eye   ? AKI (acute kidney injury) ([place])   ? Cavitary lesion of lung   ? Acute nonintractable headache   ? Smoking   ? Cocaine use   ? Paroxysmal SVT (supraventricular tachycardia) (HCC)   ? Abnormal EKG   ? Atrial flutter ([place]) 03/27/2020  ? Aortic atherosclerosis ([place]) 03/27/2020  ? Hypertension 03/27/2020  ? 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 Leukocytopenia 03/26/2020  ? Macrocytosis 03/26/2020  ? Moderate protein malnutrition ([place]) 03/26/2020  ? Hypoalbuminemia 03/26/2020  ? AIDS (acquired immune deficiency syndrome) ([place]) 03/26/2020  ? ? ?Patient's Medications  ?New Prescriptions  ? MIRTAZAPINE (REMERON) 15 MG TABLET    Take 1 tablet  by mouth at bedtime.  ?Previous Medications  ? ACETAMINOPHEN (TYLENOL) 500 MG TABLET    Take 500 mg by mouth every 6 (six) hours as needed for moderate pain or headache.  ? AZITHROMYCIN (ZITHROMAX) 500 MG TABLET    Take 1 tablet (500 mg total) by mouth daily.  ? CEPHALEXIN (KEFLEX) 500 MG CAPSULE    Take 500 mg by mouth every 6 (six) hours.  ? DOLUTEGRAVIR (TIVICAY) 50 MG TABLET    Take 1 tablet (50 mg total) by mouth daily.  ? EMTRICITABINE-TENOFOVIR (TRUVADA) 200-300 MG TABLET    Take 1 tablet by mouth daily.  ?  ETHAMBUTOL (MYAMBUTOL) 400 MG TABLET    Take 2.5 tablets (1,000 mg total) by mouth daily.  ? FLUCONAZOLE (DIFLUCAN) 200 MG TABLET    Take 1 tablet (200 mg total) by mouth daily.  ? FLUTICASONE (FLONASE) 50 MCG/ACT NASAL SPRAY    Place 1 spray into both nostrils as needed for allergies or rhinitis.  ? FOLIC ACID (FOLVITE) 1 MG TABLET    Take 1 tablet (1 mg total) by mouth daily.  ? ONDANSETRON (ZOFRAN-ODT) 4 MG DISINTEGRATING TABLET    Take 1 tablet (4 mg total) by mouth every 8 (eight) hours as needed for nausea or vomiting.  ? OXYCODONE (OXY IR/ROXICODONE) 5 MG IMMEDIATE RELEASE TABLET    Take 1-2 tablets (5-10 mg total) by mouth every 6 (six) hours as needed for severe pain.  ? PANTOPRAZOLE (PROTONIX) 20 MG TABLET    Take 1 tablet (20 mg total) by mouth daily.  ? RIFABUTIN (MYCOBUTIN) 150 MG CAPSULE    Take 2 capsules (300 mg total) by mouth daily.  ? SULFAMETHOXAZOLE-TRIMETHOPRIM (BACTRIM) 400-80 MG TABLET    Take 1 tablet by mouth daily.  ?Modified Medications  ? No medications on file  ?Discontinued Medications  ? No medications on file  ? ? ?Allergies: ?Allergies  ?Allergen Reactions  ? Bactrim [Sulfamethoxazole-Trimethoprim] Other (See Comments)  ?  Patient was trialed on DS TIW and SS daily  for PJP prophylaxis and developed hyperkalemia and increased Scr  ? ? ?Past Medical History: ?Past Medical History:  ?Diagnosis Date  ? Human immunodeficiency virus (HIV) disease ([place]) 03/26/2020  ? ? ?Social History: ?Social History  ? ?Socioeconomic History  ? Marital status: Married  ?  Spouse name: Not on file  ? Number of children: Not on file  ? Years of education: Not on file  ? Highest education level: Not on file  ?Occupational History  ? Not on file  ?Tobacco Use  ? Smoking status: Former  ?  Types: Cigarettes  ? Smokeless tobacco: Former  ?Substance and Sexual Activity  ? Alcohol use: Yes  ?  Alcohol/week: 14.0 - 21.0 standard drinks  ?  Types: 14 - 21 Cans of beer per week  ? Drug use: Not Currently  ? Sexual  activity: Not on file  ?Other Topics Concern  ? Not on file  ?Social History Narrative  ? Not on file  ? ?Social Determinants of Health  ? ?Financial Resource Strain: Not on file  ?Food Insecurity: Not on file  ?Transportation Needs: Not on file  ?Physical Activity: Not on file  ?Stress: Not on file  ?Social Connections: Not on file  ? ? ?Labs: ?Lab Results  ?Component Value Date  ? HIV1RNAQUANT 105,000 (H) 04/04/2021  ? HIV1RNAQUANT 111,000 01/22/2021  ? HIV1RNAQUANT 25,300 03/27/2020  ? CD4TABS <35 (L) 01/22/2021  ? CD4TABS <35 (L) 10/15/2020  ? ? ?RPR and STI ?Lab Results  ?Component Value Date  ? LABRPR NON-REACTIVE 04/04/2021  ? LABRPR NON REACTIVE 10/16/2020  ? ? ?STI Results GC CT  ?10/15/2020 Negative Negative  ? ? ?Hepatitis B ?Lab Results  ?Component Value Date  ? HEPBSAG NON REACTIVE 10/16/2020  ? HEPBCAB Reactive (A) 04/03/2020  ? ?Hepatitis C ?No results found for: [place], HCVRNAPCRQN ?Hepatitis A ?Lab Results  ?Component Value Date  ? HAV Reactive (A) 04/03/2020  ? ?Lipids: ?Lab Results  ?Component Value Date  ? CHOL 141 03/29/2020  ? TRIG 118 03/29/2020  ? HDL 31 (L) 03/29/2020  ? CHOLHDL 4.5 03/29/2020  ? VLDL 24 03/29/2020  ? [place] 86 03/29/2020  ? ? ?Current HIV Regimen: ?Tivicay 50 mg once daily ?Truvada 200-300 mg once daily ? ?OI ppx:  ?Bactrim 1 tablet daily (400-80 mg) ?Fluconazole 1 tablet daily (200 mg) ? ?MAC Regimen: ?Ethambutol 2.5 tablets daily (1000 mg total) ?Rifabutin 2 capsules daily (300 mg total) ?Azithromycin 1 tablet (500 mg) daily ? ? ?Assessment: ?Ricky [name] presents to clinic today to reestablish care for his chronic HIV infection. Saw Dr. Gale Journey at the beginning of March where he was recommended to start:Tivicay, Truvada, ethambutol, rifabutin, and azithromycin.  ? ?He has been seen by our ID team in the hospital during multiple admissions in February, September, and December 2022. He struggles with chronic non-adherence to any medication and states he has had chronic stomach issues  making it difficult to take medications. Previously was given Biktarvy at his hospital admissions in 2022. Current gene mutations: R211K, E35D,M36I,L63P,V77I/V (No drug resistance conferred). He has been prescribed Tivicay and Truvada once daily. Due to concerns of rifabutin's DDI with TAF and bictegravir, Tivicay and Truvada is preferred. ? ?Patient experiencing abdominal symptoms along with fevers; concern for disseminated MAC. Patient will need azithromycin 539m, ethambutol 1000 mg (~15 mg/kg), and rifabutin 3054monce daily. As patient has Medicaid[facility]. Reviewed the extremely high importance of taking his medications given his serious infections;  however, patient would consistently interrupt my education. Counseled on side effects of each of the antibiotics and how best to take them. Filled pill boxes for 3 weeks until his next visit with Dr. Gale Journey (holding the rest of his medications here in clinic). Emphasized the importance of adherence due to the severity of his infections.  ? ?Due to abdominal pain, patient states he has had a loss of appetite. He says when his stomach hurts he does not want to take his medications. To help aid appetite and hopefully adherence-prescribed mirtazapine 15 mg QHS. ? ?Plan: ?- Continue Tivicay, Truvada, ethambutol, rifabutin (take on an empty stomach), and azithromycin, fluconazole, bactrim ?- Bring back pill boxes to refill meds in 3 weeks (4/4) ?- F/U on mirtazapine as an appetite aid ?- Call with any issues or questions ? ?Thank you for allowing pharmacy to be apart of this patient's care ? ?Asher Muir, PharmD Candidate ?

## 2021-04-18 ENCOUNTER — Ambulatory Visit (INDEPENDENT_AMBULATORY_CARE_PROVIDER_SITE_OTHER): Payer: Medicaid Other | Admitting: Pulmonary Disease

## 2021-04-18 ENCOUNTER — Telehealth: Payer: Self-pay

## 2021-04-18 ENCOUNTER — Encounter: Payer: Self-pay | Admitting: Pulmonary Disease

## 2021-04-18 ENCOUNTER — Other Ambulatory Visit: Payer: Self-pay

## 2021-04-18 VITALS — BP 108/68 | HR 108 | Temp 98.6°F | Ht 74.0 in | Wt 145.3 lb

## 2021-04-18 DIAGNOSIS — B20 Human immunodeficiency virus [HIV] disease: Secondary | ICD-10-CM | POA: Diagnosis not present

## 2021-04-18 DIAGNOSIS — R599 Enlarged lymph nodes, unspecified: Secondary | ICD-10-CM

## 2021-04-18 DIAGNOSIS — A312 Disseminated mycobacterium avium-intracellulare complex (DMAC): Secondary | ICD-10-CM | POA: Diagnosis not present

## 2021-04-18 NOTE — Progress Notes (Signed)
Synopsis: Referred in March 2023 for Lung Nodule by No ref. provider found  Subjective:   PATIENT ID: Ricky [name] GENDER: male DOB: 30-Oct-1955, MRN: [id number]  Chief Complaint  Patient presents with   Consult    SOB with activity     [name], past medical history of HIV, currently on Truvada Zithromax daily.Patient was recently COVID-positive in January 2023.  Patient with HIV 1 antibody positive, HIV 1 RNA quantity of 105,000.  Patient's urine drug screen at the time in December 2022 positive for opiates and cocaine.  Patient had a CT chest abdomen pelvis completed on 03/15/2021 which revealed extensive adenopathy within the chest concerning for lymphoma.  Patient was referred for consideration for tissue sampling.  OV 04/18/2021: We reviewed his images today.  Patient has lots of complaints regarding pain fatigue weight loss.  Today in the office is also argumentative.  Blaming me for never doing anything for him.  And being seen by multiple medical providers.  Does not understand why he is not getting any answers or biopsy in the office.  I explained that he would need to go to sleep in the hospital for a procedure.  And that I told him he would need someone to bring him to the hospital and take him home.  He stated he did not have anyone to do this he was not going to come to the hospital early in the morning.   Past Medical History:  Diagnosis Date   Human immunodeficiency virus (HIV) disease (HCC) 03/26/2020     Family History  Problem Relation Age of Onset   Hypertension Mother    Kidney disease Mother    Hypertension Sister    Hypertension Brother    Kidney cancer Brother    Colon cancer Neg Hx    Liver cancer Neg Hx    Pancreatic cancer Neg Hx    Esophageal cancer Neg Hx      Past Surgical History:  Procedure Laterality Date   IR FL GUIDED LOC OF NEEDLE/CATH TIP FOR SPINAL INJECTION LT  10/17/2020   SVT ABLATION N/A 04/23/2020   Procedure: SVT  ABLATION;  Surgeon: Marinus Maw, MD;  Location: [facility];  Service: Cardiovascular;  Laterality: N/A;    Social History   Socioeconomic History   Marital status: Married    Spouse name: Not on file   Number of children: Not on file   Years of education: Not on file   Highest education level: Not on file  Occupational History   Not on file  Tobacco Use   Smoking status: Former    Types: Cigarettes   Smokeless tobacco: Former   Tobacco comments:    Smokes 1 - 2 cigarttes a day 04/18/21  Substance and Sexual Activity   Alcohol use: Yes    Alcohol/week: 14.0 - 21.0 standard drinks    Types: 14 - 21 Cans of beer per week   Drug use: Not Currently   Sexual activity: Not on file  Other Topics Concern   Not on file  Social History Narrative   Not on file   Social Determinants of Health   Financial Resource Strain: Not on file  Food Insecurity: Not on file  Transportation Needs: Not on file  Physical Activity: Not on file  Stress: Not on file  Social Connections: Not on file  Intimate Partner Violence: Not on file     Allergies  Allergen Reactions   Bactrim [  Sulfamethoxazole-Trimethoprim] Other (See Comments)    Patient was trialed on DS TIW and SS daily for PJP prophylaxis and developed hyperkalemia and increased Scr     Outpatient Medications Prior to Visit  Medication Sig Dispense Refill   acetaminophen (TYLENOL) 500 MG tablet Take 500 mg by mouth every 6 (six) hours as needed for moderate pain or headache.     azithromycin (ZITHROMAX) 500 MG tablet Take 1 tablet (500 mg total) by mouth daily. 30 tablet 11   cephALEXin (KEFLEX) 500 MG capsule Take 500 mg by mouth every 6 (six) hours.     dolutegravir (TIVICAY) 50 MG tablet Take 1 tablet (50 mg total) by mouth daily. 30 tablet 11   emtricitabine-tenofovir (TRUVADA) 200-300 MG tablet Take 1 tablet by mouth daily. 30 tablet 11   ethambutol (MYAMBUTOL) 400 MG tablet Take 2.5 tablets (1,000 mg total) by mouth  daily. 75 tablet 11   fluconazole (DIFLUCAN) 200 MG tablet Take 1 tablet (200 mg total) by mouth daily. 30 tablet 11   folic acid (FOLVITE) 1 MG tablet Take 1 tablet (1 mg total) by mouth daily. 90 tablet 0   mirtazapine (REMERON) 15 MG tablet Take 1 tablet  by mouth at bedtime. 30 tablet 1   ondansetron (ZOFRAN-ODT) 4 MG disintegrating tablet Take 1 tablet (4 mg total) by mouth every 8 (eight) hours as needed for nausea or vomiting. 10 tablet 0   oxyCODONE (OXY IR/ROXICODONE) 5 MG immediate release tablet Take 1-2 tablets (5-10 mg total) by mouth every 6 (six) hours as needed for severe pain. 90 tablet 0   pantoprazole (PROTONIX) 20 MG tablet Take 1 tablet (20 mg total) by mouth daily. 30 tablet 0   rifabutin (MYCOBUTIN) 150 MG capsule Take 2 capsules (300 mg total) by mouth daily. 60 capsule 11   sulfamethoxazole-trimethoprim (BACTRIM) 400-80 MG tablet Take 1 tablet by mouth daily. 30 tablet 11   fluticasone (FLONASE) 50 MCG/ACT nasal spray Place 1 spray into both nostrils as needed for allergies or rhinitis. (Patient not taking: Reported on 01/23/2021) 16 g 2   No facility-administered medications prior to visit.    Review of Systems  Constitutional:  Positive for malaise/fatigue and weight loss. Negative for chills and fever.  HENT:  Negative for hearing loss, sore throat and tinnitus.   Eyes:  Negative for blurred vision and double vision.  Respiratory:  Positive for shortness of breath. Negative for cough, hemoptysis, sputum production, wheezing and stridor.   Cardiovascular:  Negative for chest pain, palpitations, orthopnea, leg swelling and PND.  Gastrointestinal:  Negative for abdominal pain, constipation, diarrhea, heartburn, nausea and vomiting.  Genitourinary:  Negative for dysuria, hematuria and urgency.  Musculoskeletal:  Negative for joint pain and myalgias.  Skin:  Negative for itching and rash.  Neurological:  Positive for weakness. Negative for dizziness, tingling and  headaches.  Endo/Heme/Allergies:  Negative for environmental allergies. Does not bruise/bleed easily.  Psychiatric/Behavioral:  Negative for depression. The patient is not nervous/anxious and does not have insomnia.   All other systems reviewed and are negative.   Objective:  Physical Exam Vitals reviewed.  Constitutional:      General: He is not in acute distress.    Appearance: He is well-developed.     Comments: Frail thin.  Muscle wasting present   HENT:     Head: Normocephalic and atraumatic.  Eyes:     General: No scleral icterus.    Conjunctiva/sclera: Conjunctivae normal.     Pupils: Pupils are equal, round,  and reactive to light.  Neck:     Vascular: No JVD.     Trachea: No tracheal deviation.  Cardiovascular:     Rate and Rhythm: Normal rate and regular rhythm.     Heart sounds: Normal heart sounds. No murmur heard. Pulmonary:     Effort: Pulmonary effort is normal. No tachypnea, accessory muscle usage or respiratory distress.     Breath sounds: No stridor. No wheezing, rhonchi or rales.  Abdominal:     General: There is no distension.     Palpations: Abdomen is soft.     Tenderness: There is no abdominal tenderness.  Musculoskeletal:        General: No tenderness.     Cervical back: Neck supple.  Lymphadenopathy:     Cervical: No cervical adenopathy.  Skin:    General: Skin is warm and dry.     Capillary Refill: Capillary refill takes less than 2 seconds.     Findings: No rash.  Neurological:     Mental Status: He is alert and oriented to person, place, and time.     Motor: Weakness present.  Psychiatric:        Behavior: Behavior normal.     Vitals:   04/18/21 1420  BP: 108/68  Pulse: (!) 108  Temp: 98.6 F (37 C)  TempSrc: Oral  SpO2: 99%  Weight: 145 lb 4.8 oz (65.9 kg)  Height: 6\' 2"  (1.88 m)   99% on RA BMI Readings from Last 3 Encounters:  04/18/21 18.66 kg/m  04/05/21 19.41 kg/m  04/04/21 19.26 kg/m   Wt Readings from Last 3  Encounters:  04/18/21 145 lb 4.8 oz (65.9 kg)  04/05/21 147 lb 1.6 oz (66.7 kg)  04/04/21 146 lb (66.2 kg)     CBC    Component Value Date/Time   WBC 4.2 04/05/2021 1445   WBC 4.4 04/04/2021 1207   RBC 2.71 (L) 04/05/2021 1445   HGB 9.0 (L) 04/05/2021 1445   HGB 12.9 (L) 04/15/2020 0405   HCT 26.6 (L) 04/05/2021 1445   PLT 197 04/05/2021 1445   MCV 98.2 04/05/2021 1445   MCH 33.2 04/05/2021 1445   MCHC 33.8 04/05/2021 1445   RDW 14.5 04/05/2021 1445   LYMPHSABS 0.5 (L) 04/05/2021 1445   MONOABS 0.3 04/05/2021 1445   EOSABS 0.0 04/05/2021 1445   BASOSABS 0.0 04/05/2021 1445      Chest Imaging: February 2023 CT chest mediastinal adenopathy with significant necrotic lymph nodes concerning for malignancy. The patient's images have been independently reviewed by me.    Pulmonary Functions Testing Results: No flowsheet data found.  FeNO:   Pathology:   Echocardiogram:   Heart Catheterization:     Assessment & Plan:     ICD-10-CM   1. Adenopathy  R59.9 NM PET Image Initial (PI) Skull Base To Thigh (F-18 FDG)    2. 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 Symptomatic HIV infection (HCC)  B20     4. AIDS (acquired immune deficiency syndrome) (HCC)  B20       Discussion:  [name] with HIV previously noncompliant with medications developed AIDS, disseminated MAI.  Extensive mediastinal adenopathy that is necrotic and concerning for underlying lymphoma versus infectious etiology.  Plan: Referred from medical oncology for tissue biopsy and consideration for bronchoscopy with video bronchoscopy and endobronchial ultrasound and transbronchial needle aspiration. I discussed this with the patient in the office.  He was very upset  that he thought he was getting a biopsy completed today. I explained that he would need general anesthesia to go to sleep for a biopsy.  He was very hesitant about considering  biopsy and going off to sleep for this. I offered him multiple days when he finally decided that he would consider tissue biopsy.  Every day that we was offered for the patient he declined. He stated that he would not be able to do them in the morning because he was not a morning person and he slept the best early in the morning. He also states that he does not have someone to bring him to his procedure and take him home. The patient will need someone to stay with him after receiving general anesthesia. I was very clear with him that he needed to arrange this before we would consider biopsy.  This was important that he likely has something in the mediastinal lymph nodes of his chest that needs to be evaluated. Patient was given our telephone number and encouraged to speak with his brother to see if his brother would be willing to help with transportation issues and arranging transport to and from the hospital for his procedure.  I will reach out to his oncologist to let them know where we are regarding biopsy.  In the meantime I have ordered a PET scan.  If there is a superficial lymph node that potentially could be excised instead of having general anesthesia the patient may be more amendable to this process.  He seems to be very hesitant about undergoing anesthesia.   Current Outpatient Medications:    acetaminophen (TYLENOL) 500 MG tablet, Take 500 mg by mouth every 6 (six) hours as needed for moderate pain or headache., Disp: , Rfl:    azithromycin (ZITHROMAX) 500 MG tablet, Take 1 tablet (500 mg total) by mouth daily., Disp: 30 tablet, Rfl: 11   cephALEXin (KEFLEX) 500 MG capsule, Take 500 mg by mouth every 6 (six) hours., Disp: , Rfl:    dolutegravir (TIVICAY) 50 MG tablet, Take 1 tablet (50 mg total) by mouth daily., Disp: 30 tablet, Rfl: 11   emtricitabine-tenofovir (TRUVADA) 200-300 MG tablet, Take 1 tablet by mouth daily., Disp: 30 tablet, Rfl: 11   ethambutol (MYAMBUTOL) 400 MG tablet,  Take 2.5 tablets (1,000 mg total) by mouth daily., Disp: 75 tablet, Rfl: 11   fluconazole (DIFLUCAN) 200 MG tablet, Take 1 tablet (200 mg total) by mouth daily., Disp: 30 tablet, Rfl: 11   folic acid (FOLVITE) 1 MG tablet, Take 1 tablet (1 mg total) by mouth daily., Disp: 90 tablet, Rfl: 0   mirtazapine (REMERON) 15 MG tablet, Take 1 tablet  by mouth at bedtime., Disp: 30 tablet, Rfl: 1   ondansetron (ZOFRAN-ODT) 4 MG disintegrating tablet, Take 1 tablet (4 mg total) by mouth every 8 (eight) hours as needed for nausea or vomiting., Disp: 10 tablet, Rfl: 0   oxyCODONE (OXY IR/ROXICODONE) 5 MG immediate release tablet, Take 1-2 tablets (5-10 mg total) by mouth every 6 (six) hours as needed for severe pain., Disp: 90 tablet, Rfl: 0   pantoprazole (PROTONIX) 20 MG tablet, Take 1 tablet (20 mg total) by mouth daily., Disp: 30 tablet, Rfl: 0   rifabutin (MYCOBUTIN) 150 MG capsule, Take 2 capsules (300 mg total) by mouth daily., Disp: 60 capsule, Rfl: 11   sulfamethoxazole-trimethoprim (BACTRIM) 400-80 MG tablet, Take 1 tablet by mouth daily., Disp: 30 tablet, Rfl: 11   fluticasone (FLONASE) 50 MCG/ACT nasal spray, Place  1 spray into both nostrils as needed for allergies or rhinitis. (Patient not taking: Reported on 01/23/2021), Disp: 16 g, Rfl: 2  I spent 65 minutes dedicated to the care of this patient on the date of this encounter to include pre-visit review of records, face-to-face time with the patient discussing conditions above, post visit ordering of testing, clinical documentation with the electronic health record, making appropriate referrals as documented, and communicating necessary findings to members of the patients care team.   I called and spoke with Dr. Leroy Kennedy, DO [facility] 04/18/2021 2:46 PM

## 2021-04-18 NOTE — Telephone Encounter (Addendum)
Patient is aware that will hold off on the EGD for now. I have connected patient with Mitch our [employer] Patient has also agreed to this.  ?

## 2021-04-18 NOTE — Telephone Encounter (Signed)
-----   Message from Jabier Mutton, MD sent at 04/12/2021  9:36 AM EST ----- ?Regarding: RE: Procedure no show for today ?Again I really appreciate you and your clinic trying to get him in ASAP. I presume being this sick he/his family would be more responsive to getting diagnosis/meds  ? ?At this time he is still being prepped for lymph node biopsy.  ? ?I worry if we push too many procedures we might not get the highest yield done, which is the biopsy. Ideally I would want all at this time but it is rather difficult ? ?Let's hold on the egd ? ?I will have my clinic staff let him know to await the biopsy of his node for now.  ? ? ?Thank you again ?(Team [facility] please let him know egd cancelled, and he should follow up with the lymph node biopsy appointment) ? ?[place] ?----- Message ----- ?From: Thornton Park, MD ?Sent: 04/12/2021   8:35 AM EST ?To: Jabier Mutton, MD ?Subject: RE: Procedure no show for today               ? ?Not sure how else to help him get here. But, we will see if he can come later in the month. ? ?KLB ?----- Message ----- ?From: Jabier Mutton, MD ?Sent: 04/12/2021   8:04 AM EST ?To: Thornton Park, MD ?Subject: RE: Procedure no show for today               ? ?Thanks Kim ? ?We tried ? ?I am sorry for all the trouble your staffs had gone through trying to get him in ?[place] ?----- Message ----- ?From: Thornton Park, MD ?Sent: 04/12/2021   7:14 AM EST ?To: Aleatha Borer, LPN, Jabier Mutton, MD ?Subject: Procedure no show for today                   ? ?Mr. Vasques did not keep his appointment for his EGD this morning.  Apparently the driver called him when they arrived but left before he got outside to the vehicle.  ? ?He was rescheduled for the EGD later in the month but it sounds like he will again need help with transportation. ? ?Thank you. ? ?KLB ? ? ?

## 2021-04-18 NOTE — Patient Instructions (Addendum)
Thank you for visiting Dr. Valeta Harms at [facility]. ?Today we recommend the following: ? ?Orders Placed This Encounter  ?Procedures  ? NM PET Image Initial (PI) Skull Base To Thigh (F-18 FDG)  ? ?You need to have transportation for a procedure.  ?Once you agree to having transportation and someone to stay with you we can set a date.  ?Please talk to your brother and let us know if you agree and we can schedule your procedure.  ? ?Return in about 4 weeks (around 05/16/2021) for with Eric Form, NP, or Dr. Valeta Harms. ? ? ? ?Please do your part to reduce the spread of COVID-19.  ? ?

## 2021-04-19 LAB — COMPLETE METABOLIC PANEL WITH GFR
ALT: 17 U/L (ref 9–46)
AST: 58 U/L — ABNORMAL HIGH (ref 10–35)
Albumin: <1.5 g/dL — ABNORMAL LOW (ref 3.6–5.1)
Alkaline phosphatase (APISO): 405 U/L — ABNORMAL HIGH (ref 35–144)
BUN/Creatinine Ratio: 10 (calc) (ref 6–22)
BUN: 7 mg/dL (ref 7–25)
CO2: 25 mmol/L (ref 20–32)
Calcium: 7.3 mg/dL — ABNORMAL LOW (ref 8.6–10.3)
Chloride: 98 mmol/L (ref 98–110)
Creat: 0.68 mg/dL — ABNORMAL LOW (ref 0.70–1.35)
Glucose, Bld: 89 mg/dL (ref 65–99)
Potassium: 3.5 mmol/L (ref 3.5–5.3)
Sodium: 130 mmol/L — ABNORMAL LOW (ref 135–146)
Total Bilirubin: 1.5 mg/dL — ABNORMAL HIGH (ref 0.2–1.2)
Total Protein: 6.6 g/dL (ref 6.1–8.1)
eGFR: 104 mL/min/{1.73_m2} (ref >=60)

## 2021-04-19 LAB — HIV RNA, RTPCR W/R GT (RTI, PI,INT)
HIV 1 RNA Quant: 105000 copies/mL — ABNORMAL HIGH
HIV-1 RNA Quant, Log: 5.02 Log copies/mL — ABNORMAL HIGH

## 2021-04-19 LAB — HIV-1 GENOTYPE: HIV-1 Genotype: DETECTED — AB

## 2021-04-19 LAB — HIV-1 INTEGRASE GENOTYPE

## 2021-04-19 LAB — CBC WITH DIFFERENTIAL/PLATELET
Absolute Monocytes: 343 cells/uL (ref 200–950)
Basophils Absolute: 22 cells/uL (ref 0–200)
Basophils Relative: 0.5 %
Eosinophils Absolute: 0 cells/uL — ABNORMAL LOW (ref 15–500)
Eosinophils Relative: 0 %
HCT: 24 % — ABNORMAL LOW (ref 38.5–50.0)
Hemoglobin: 8.6 g/dL — ABNORMAL LOW (ref 13.2–17.1)
Lymphs Abs: 334 cells/uL — ABNORMAL LOW (ref 850–3900)
MCH: 33.3 pg — ABNORMAL HIGH (ref 27.0–33.0)
MCHC: 35.8 g/dL (ref 32.0–36.0)
MCV: 93 fL (ref 80.0–100.0)
MPV: 9.3 fL (ref 7.5–12.5)
Monocytes Relative: 7.8 %
Neutro Abs: 3700 cells/uL (ref 1500–7800)
Neutrophils Relative %: 84.1 %
Platelets: 239 10*3/uL (ref 140–400)
RBC: 2.58 10*6/uL — ABNORMAL LOW (ref 4.20–5.80)
RDW: 13.8 % (ref 11.0–15.0)
Total Lymphocyte: 7.6 %
WBC: 4.4 10*3/uL (ref 3.8–10.8)

## 2021-04-19 LAB — RPR: RPR Ser Ql: NONREACTIVE

## 2021-04-23 ENCOUNTER — Encounter: Payer: Medicaid Other | Admitting: Gastroenterology

## 2021-04-23 ENCOUNTER — Other Ambulatory Visit: Payer: Self-pay | Admitting: Physician Assistant

## 2021-04-23 DIAGNOSIS — R591 Generalized enlarged lymph nodes: Secondary | ICD-10-CM

## 2021-04-23 NOTE — Progress Notes (Signed)
I called and spoke to the patient, Mr. Ricky [name], and his brother, Mr. Ricky [name]. Discussed the option of CT core biopsy of left para aortic lymph node since patient declined undergoing general anesthesia for EBUS with FNA.  ? ?Patient and his brother agreed to move forward so I went ahead and placed an order for CT biopsy ?

## 2021-04-24 ENCOUNTER — Other Ambulatory Visit (HOSPITAL_COMMUNITY): Payer: Self-pay

## 2021-04-24 ENCOUNTER — Encounter (HOSPITAL_COMMUNITY): Payer: Self-pay | Admitting: Radiology

## 2021-04-24 NOTE — Progress Notes (Signed)
Patient Name  ?Ricky [name] Legal Sex  ?Male DOB  ?October 10, 1955 SSN  ?[id number] Address  ?[place] [address] Phone  ?[phone] (Home)  ?[phone] (Mobile) *Preferred*  ? ? RE: Biopsy request ?Received: Yesterday ?Unknown Foley, MD; P Ir Procedure Requests; Garth Bigness D ?Cc: Orson Slick, MD ?Doristine Devoid thank you. Order is placed. I have communicated this with the patient and his brother. Please call his brother, [name] to confirm time for the biopsy.  ? ?Thanks again!  ? ?Murray Hodgkins   ?  ?   ?Previous Messages ?  ?----- Message -----  ?From: Criselda Peaches, MD  ?Sent: 04/23/2021   1:12 PM EDT  ?To: Dorene Grebe, MD, *  ?Subject: RE: Biopsy request                            ? ?Yes, we can target that left paraaortic LN.  Please place an order for CT biopsy.  ? ?I have Holley our schedulers as well: approved for CT core biopsy of left para aortic lymph node.  ? ?Thank you,  ? ?Heath  ? ?Signed,  ? ?Criselda Peaches, MD  ? ?Pager: [phone]  ?Clinic: [id number]  ? ? ? ? ?----- Message -----  ?From: Lincoln Brigham, PA-C  ?Sent: 04/22/2021   5:13 PM EDT  ?To: Criselda Peaches, MD, Orson Slick, MD  ?Subject: Biopsy request                                ? ?Hey Dr. Laurence Ferrari,  ? ?Dr. Lorenso Courier and I saw Mr. Gaillard for diffuse lymphadenopathy seen on recent CT imaging. We referred patient to Dr. Valeta Harms to evaluate for EBUS with transbronchial needle aspiration. Patient declined EBUS since he does not want general anesthesia. A PET scan has been ordered to assess for another target lesion to be biopsied.  ? ?We wanted to see if the 3.2 x 3.9 cm node in the left para-aortic station (axial image 74  ?of series 3) would be amenable for percutaneous biopsy?  ? ?Appreciate your input.  ? ?Thanks,  ?Murray Hodgkins  ?

## 2021-04-25 ENCOUNTER — Other Ambulatory Visit (HOSPITAL_COMMUNITY): Payer: Self-pay

## 2021-04-29 ENCOUNTER — Other Ambulatory Visit (HOSPITAL_COMMUNITY): Payer: Self-pay

## 2021-04-30 ENCOUNTER — Other Ambulatory Visit: Payer: Self-pay | Admitting: Radiology

## 2021-04-30 ENCOUNTER — Other Ambulatory Visit (HOSPITAL_COMMUNITY): Payer: Self-pay

## 2021-05-01 ENCOUNTER — Ambulatory Visit (HOSPITAL_COMMUNITY)
Admission: RE | Admit: 2021-05-01 | Discharge: 2021-05-01 | Disposition: A | Discharge status: Home / Self Care | Payer: Medicaid Other | Source: Ambulatory Visit | Attending: Physician Assistant | Admitting: Physician Assistant

## 2021-05-01 ENCOUNTER — Other Ambulatory Visit: Payer: Self-pay

## 2021-05-01 ENCOUNTER — Encounter (HOSPITAL_COMMUNITY): Payer: Self-pay

## 2021-05-01 DIAGNOSIS — R591 Generalized enlarged lymph nodes: Secondary | ICD-10-CM

## 2021-05-01 DIAGNOSIS — Z87891 Personal history of nicotine dependence: Secondary | ICD-10-CM | POA: Diagnosis not present

## 2021-05-01 DIAGNOSIS — I10 Essential (primary) hypertension: Secondary | ICD-10-CM | POA: Insufficient documentation

## 2021-05-01 DIAGNOSIS — R1032 Left lower quadrant pain: Secondary | ICD-10-CM | POA: Diagnosis not present

## 2021-05-01 DIAGNOSIS — K668 Other specified disorders of peritoneum: Secondary | ICD-10-CM | POA: Insufficient documentation

## 2021-05-01 DIAGNOSIS — Z21 Asymptomatic human immunodeficiency virus [HIV] infection status: Secondary | ICD-10-CM | POA: Insufficient documentation

## 2021-05-01 LAB — PROTIME-INR
INR: 1.5 — ABNORMAL HIGH (ref 0.8–1.2)
Prothrombin Time: 18.2 seconds — ABNORMAL HIGH (ref 11.4–15.2)

## 2021-05-01 LAB — CBC
HCT: 24.7 % — ABNORMAL LOW (ref 39.0–52.0)
Hemoglobin: 8.5 g/dL — ABNORMAL LOW (ref 13.0–17.0)
MCH: 33.9 pg (ref 26.0–34.0)
MCHC: 34.4 g/dL (ref 30.0–36.0)
MCV: 98.4 fL (ref 80.0–100.0)
Platelets: 204 10*3/uL (ref 150–400)
RBC: 2.51 MIL/uL — ABNORMAL LOW (ref 4.22–5.81)
RDW: 13.5 % (ref 11.5–15.5)
WBC: 4 10*3/uL (ref 4.0–10.5)
nRBC: 0 % (ref 0.0–0.2)

## 2021-05-01 IMAGING — CT CT GUIDANCE NEEDLE PLACEMENT
1 of 2 series · 14 of 32 positions shown, 19 images · non-contrast
Comparison: none

INDICATION: Left retroperitoneal periaortic adenopathy

[Series 2: i-spiral 5.0 b40f · axial · 0.78mm/px · z∈[+1124,+1257]mm · 14 of 43 slices shown, 19 images]
[im 3/43  soft-tissue]
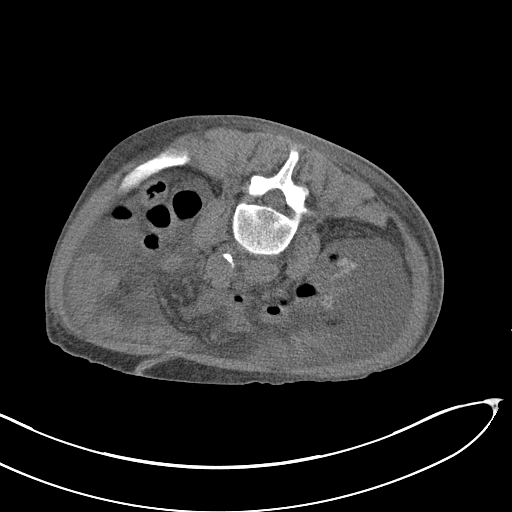
[im 3/43  bone]
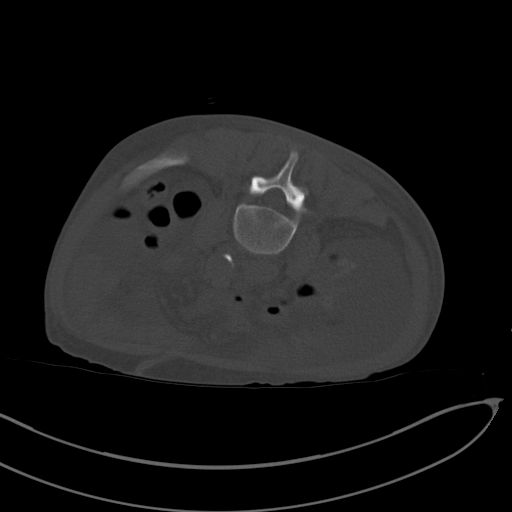
[im 7/43  soft-tissue]
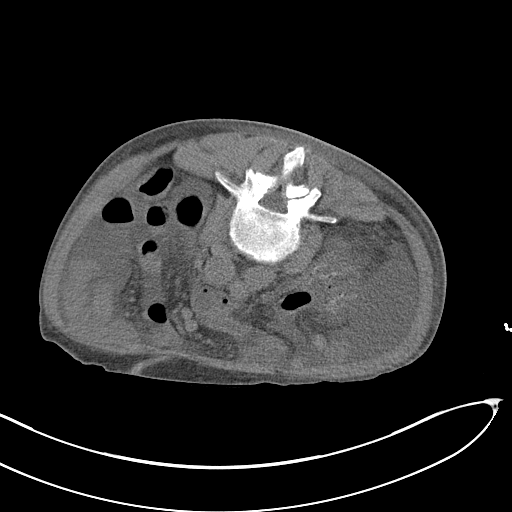
[im 9/43  soft-tissue]
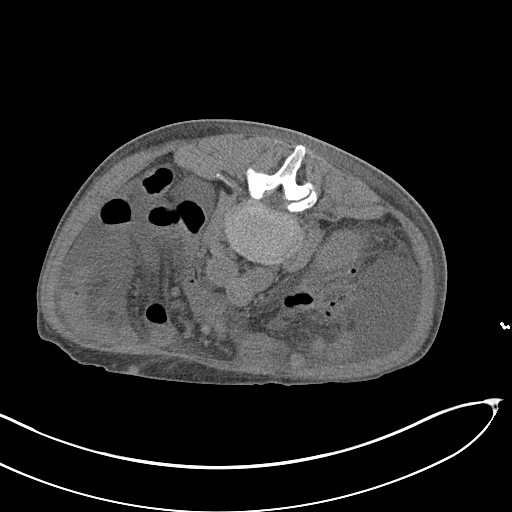
[im 13/43  soft-tissue]
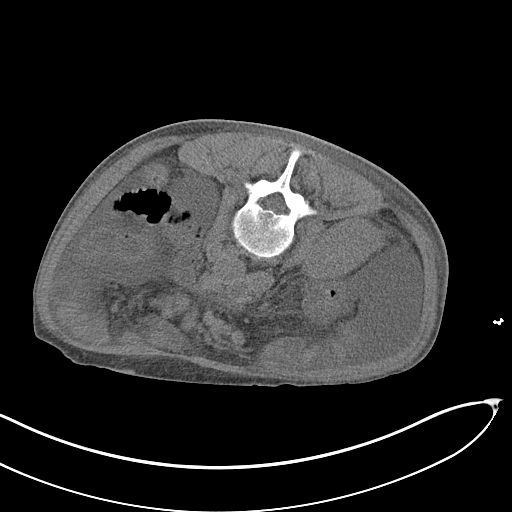
[im 15/43  soft-tissue]
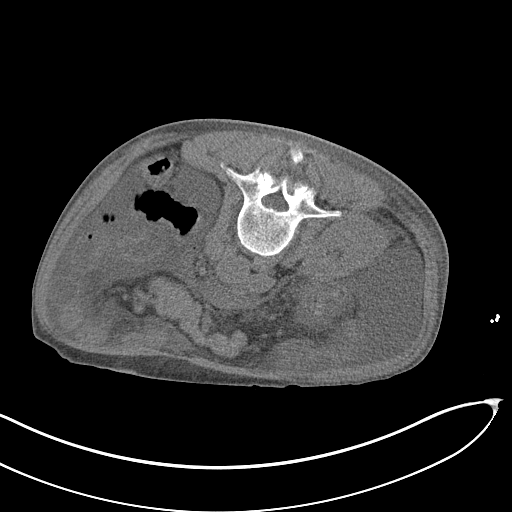
[im 19/43  soft-tissue]
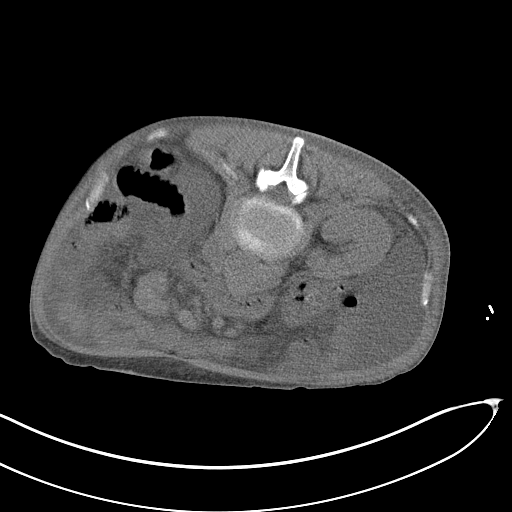
[im 23/43  soft-tissue]
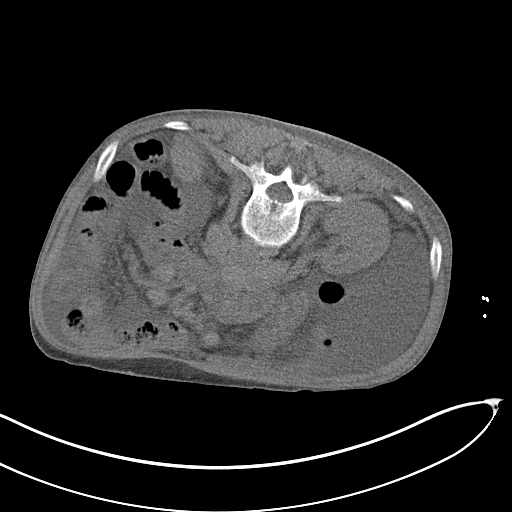
[im 25/43  soft-tissue]
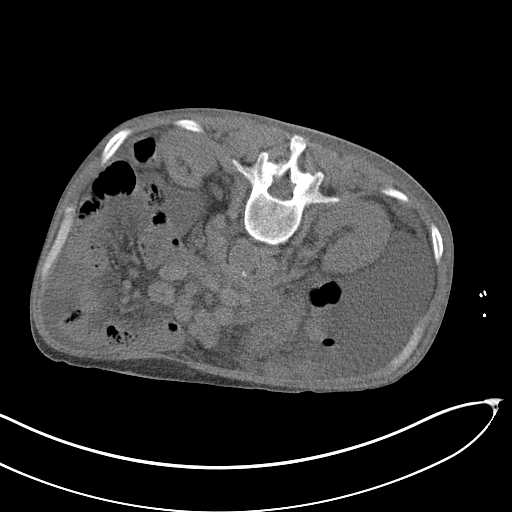
[im 29/43  soft-tissue]
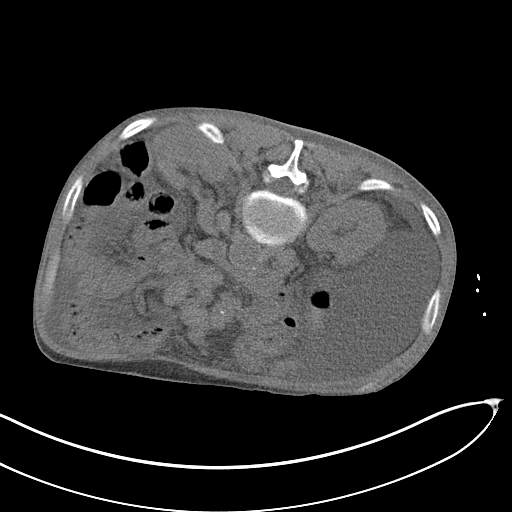
[im 29/43  bone]
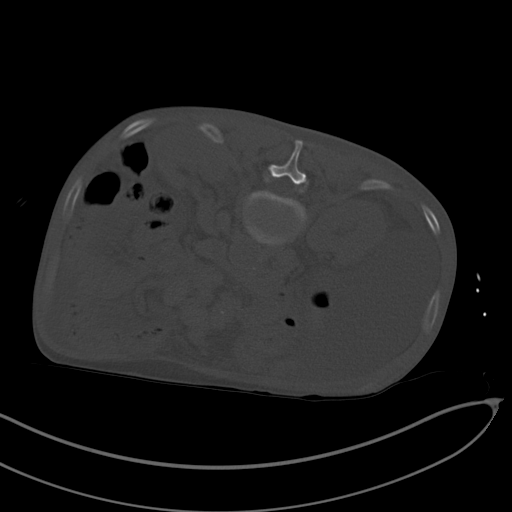
[im 31/43  soft-tissue]
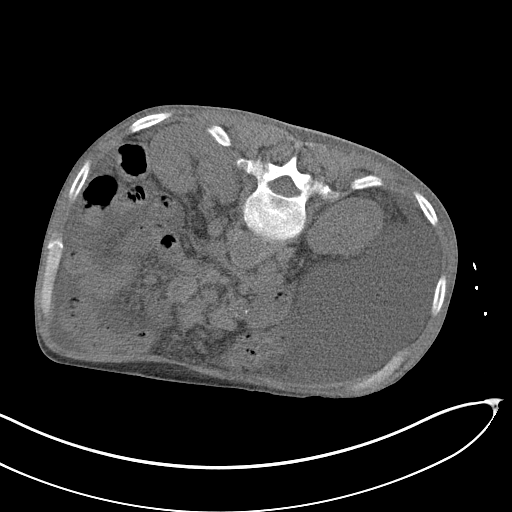
[im 35/43  soft-tissue]
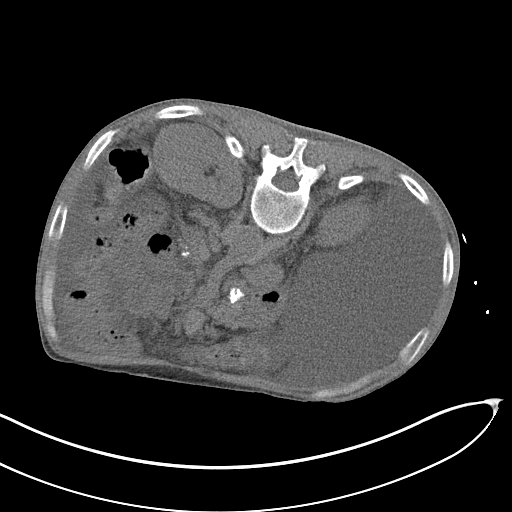
[im 35/43  lung]
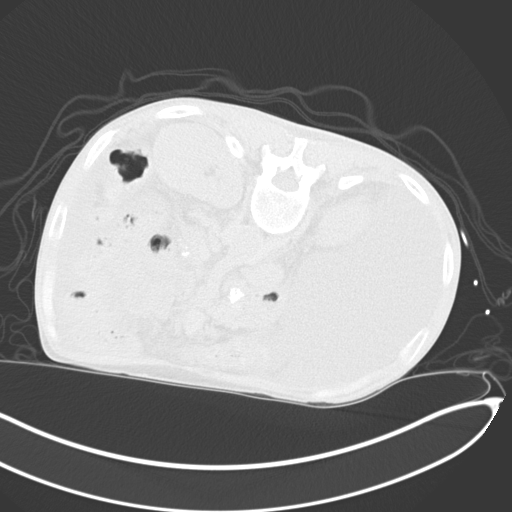
[im 37/43  soft-tissue]
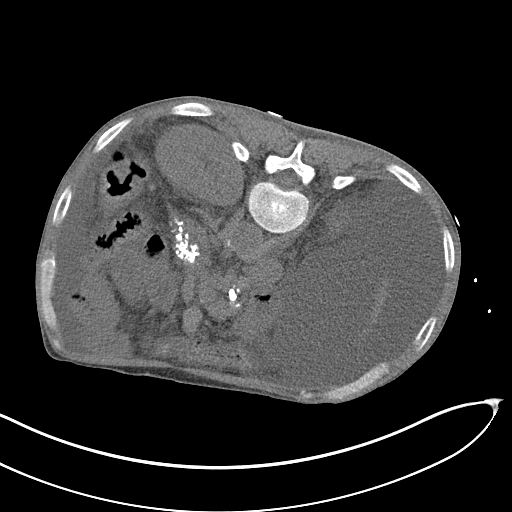
[im 37/43  lung]
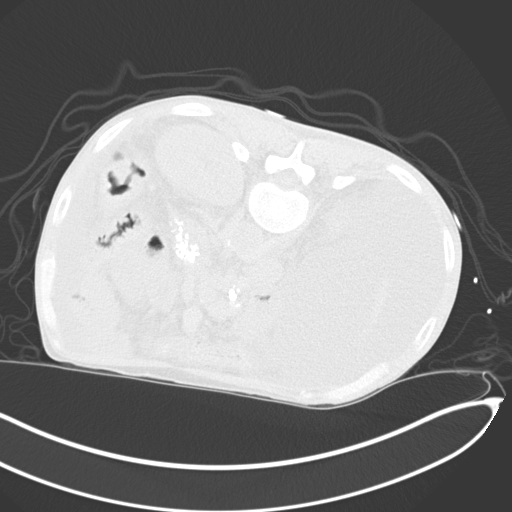
[im 39/43  lung]
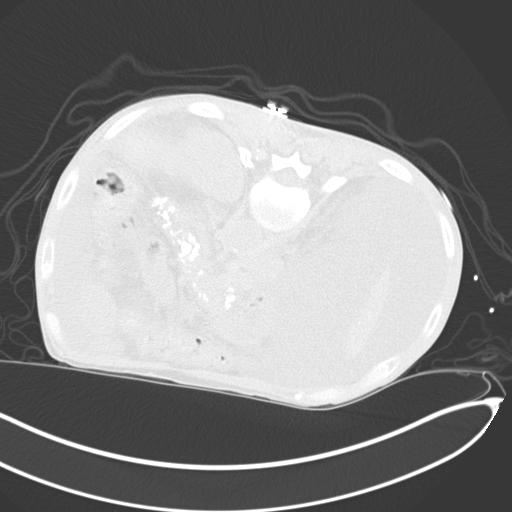
[im 41/43  soft-tissue]
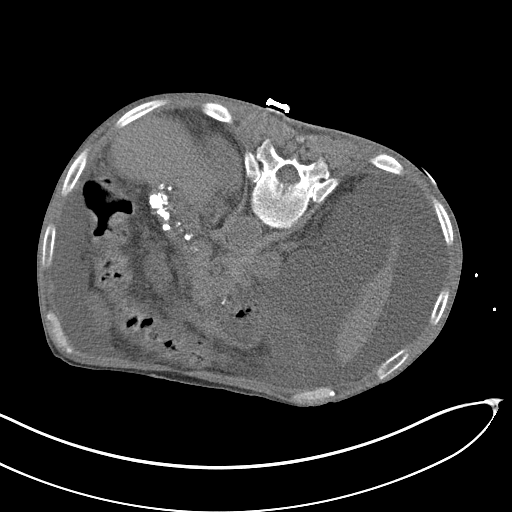
[im 41/43  lung]
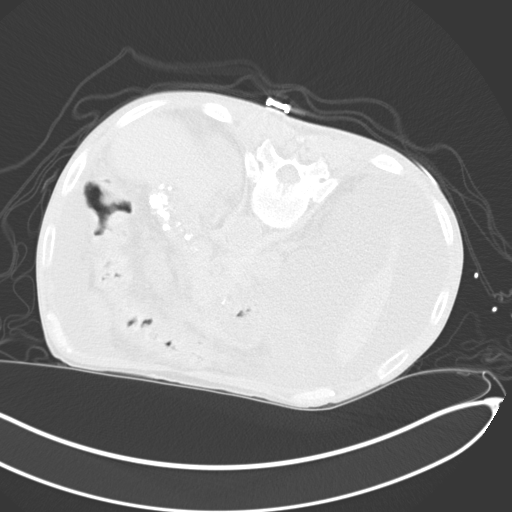

[14 of 32 positions shown; findings below may reference images not displayed]

EXAM:
CT-GUIDED BIOPSY LEFT RETROPERITONEAL PERIAORTIC ADENOPATHY

MEDICATIONS:
1% LIDOCAINE LOCAL

ANESTHESIA/SEDATION:
1.0 mg IV Versed; 25 mcg IV Fentanyl

Moderate Sedation Time:  11 MINUTE

The patient was continuously monitored during the procedure by the
interventional radiology nurse under my direct supervision.

PROCEDURE:
The procedure, risks, benefits, and alternatives were explained to
the patient. Questions regarding the procedure were encouraged and
answered. The patient understands and consents to the procedure.

Previous imaging reviewed. Patient position prone. Noncontrast
localization CT performed. The left retroperitoneal periaortic
adenopathy was localized and marked for a posterior paraspinous
approach.

Under sterile conditions and local anesthesia, a 17 gauge 11.8 cm
access needle was advanced from a left posterior paraspinous
approach to the retroperitoneal periaortic adenopathy. Needle
position confirmed with CT. 18 gauge core biopsies obtained. Samples
were intact and non fragmented. These were placed in saline.

Patient tolerated the procedure well without complication. Vital
sign monitoring by nursing staff during the procedure will continue
as patient is in the special procedures unit for post procedure
observation.
FINDINGS: The images document guide needle placement within the left
retroperitoneal periaortic adenopathy. Post biopsy images
demonstrate no hemorrhage or hematoma.

COMPLICATIONS:
None immediate.
IMPRESSION: Successful CT-guided core biopsy of the left retroperitoneal
periaortic adenopathy

RADIATION DOSE REDUCTION: This exam was performed according to the
departmental dose-optimization program which includes automated
exposure control, adjustment of the mA and/or kV according to
patient size and/or use of iterative reconstruction technique.

## 2021-05-01 MED ORDER — FENTANYL CITRATE (PF) 100 MCG/2ML IJ SOLN
INTRAMUSCULAR | Status: AC
  Filled 2021-05-01: qty 4

## 2021-05-01 MED ORDER — MIDAZOLAM HCL 2 MG/2ML IJ SOLN
INTRAMUSCULAR | Status: AC
  Filled 2021-05-01: qty 4

## 2021-05-01 MED ORDER — SODIUM CHLORIDE 0.9 % IV SOLN: INTRAVENOUS | Status: DC

## 2021-05-01 MED ORDER — MIDAZOLAM HCL 2 MG/2ML IJ SOLN
INTRAMUSCULAR | Status: AC | PRN
  Administered 2021-05-01: 1 mg via INTRAVENOUS

## 2021-05-01 MED ORDER — LIDOCAINE HCL 1 % IJ SOLN
INTRAMUSCULAR | Status: AC
  Filled 2021-05-01: qty 10

## 2021-05-01 MED ORDER — FENTANYL CITRATE (PF) 100 MCG/2ML IJ SOLN
INTRAMUSCULAR | Status: AC | PRN
  Administered 2021-05-01: 25 ug via INTRAVENOUS

## 2021-05-01 NOTE — Procedures (Signed)
Interventional Radiology Procedure Note ? ?Procedure: CT BX LEFT RP ADENOPATHY   ? ?Complications: None ? ?Estimated Blood Loss:  MIN ? ?Findings: ?18 G CORES IN SALINE   ? ?M. Daryll Brod, MD ? ? ? ?

## 2021-05-01 NOTE — Progress Notes (Signed)
Pt here with case worker [employer]. He states that the patient can sign his own consent. Pt unsure of meds, called sister and she states he is not taking meds except OxyContin / percocet. Pt staes he is taking meds but unsure of medications. Called radiology whitney RN to update inability to recall medications. Pt states he has not eaten for days, only protein shakes, case worker is present through questions.  ?

## 2021-05-01 NOTE — H&P (Signed)
? ?Chief Complaint: ?Patient was seen in consultation today for image guided biopsy of the left para aortic lymph node biopsy at the request of Courtland T ? ?Referring Physician(s): Thayil,Irene T ? ?Supervising Physician: Daryll Brod ? ?Patient Status: [facility] - Out-pt ? ?History of Present Illness: ?Ricky [name] is a 66 y.o. male with PMHs of HIV, HTN, cocaine abuse and extensive lymphadenopathy who has been followed by hem/onc.  ?LAN was first found on CT in December 2022, iCT CAP on 03/15/21 read as: ? 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 One of these nodules in the left lower lobe has become centrally cavitary compared to the prior study.  ? ?A EBUS was recommended to the patient, but patient declined due to concern for GA.  ?IR was requested for image guided biopsy of a left paraaortic LAN. ? ?Patient laying in bed, not in acute distress.  ?Reports chronic LLQ abd pain.  ?Denise headache, fever, chills, shortness of breath, cough, chest pain,  nausea ,vomiting, and bleeding. ? ? ?Past Medical History:  ?Diagnosis Date  ? Human immunodeficiency virus (HIV) disease ([place]) 03/26/2020  ? ? ?Past Surgical History:  ?Procedure Laterality Date  ? IR FL GUIDED LOC OF NEEDLE/CATH TIP FOR SPINAL INJECTION LT  10/17/2020  ? SVT ABLATION N/A 04/23/2020  ? Procedure: SVT ABLATION;  Surgeon: Evans Lance, MD;  Location: [facility];  Service: Cardiovascular;  Laterality: N/A;  ? ? ?Allergies: ?Bactrim [sulfamethoxazole-trimethoprim] ? ?Medications: ?Prior to Admission medications   ?Medication Sig Start Date End Date Taking? Authorizing Provider  ?oxyCODONE-acetaminophen (PERCOCET) 10-325 MG tablet Take 1 tablet by mouth every 4 (four)  hours as needed for pain.   Yes [provider]  ?acetaminophen (TYLENOL) 500 MG tablet Take 500 mg by mouth every 6 (six) hours as needed for moderate pain or headache.    [provider]  ?azithromycin (ZITHROMAX) 500 MG tablet Take 1 tablet (500 mg total) by mouth daily. 04/04/21 03/30/22  Prudencio Pair T, MD  ?cephALEXin (KEFLEX) 500 MG capsule Take 500 mg by mouth every 6 (six) hours. 03/27/21   [provider]  ?dolutegravir (TIVICAY) 50 MG tablet Take 1 tablet (50 mg total) by mouth daily. 04/04/21   Vu, Johnny Bridge T, MD  ?emtricitabine-tenofovir (TRUVADA) 200-300 MG tablet Take 1 tablet by mouth daily. 04/04/21   Vu, Rockey Situ, MD  ?ethambutol (MYAMBUTOL) 400 MG tablet Take 2.5 tablets (1,000 mg total) by mouth daily. 04/04/21 03/30/22  Prudencio Pair T, MD  ?fluconazole (DIFLUCAN) 200 MG tablet Take 1 tablet (200 mg total) by mouth daily. 04/04/21 03/30/22  Vu, Rockey Situ, MD  ?fluticasone (FLONASE) 50 MCG/ACT nasal spray Place 1 spray into both nostrils as needed for allergies or rhinitis. ?Patient not taking: Reported on 01/23/2021 10/26/20 12/25/20  Donney Dice, DO  ?folic acid (FOLVITE) 1 MG tablet Take 1 tablet (1 mg total) by mouth daily. 01/25/21   Mercy Riding, MD  ?mirtazapine (REMERON) 15 MG tablet Take 1 tablet  by mouth at bedtime. 04/16/21   Esmond Plants, RPH-CPP  ?ondansetron (ZOFRAN-ODT) 4 MG disintegrating tablet Take 1 tablet (4 mg total) by mouth every 8 (eight) hours as needed for nausea or vomiting. 03/23/21   Carlisle Cater, PA-C  ?oxyCODONE (OXY IR/ROXICODONE) 5  MG immediate release tablet Take 1-2 tablets (5-10 mg total) by mouth every 6 (six) hours as needed for severe pain. 04/05/21   Lincoln Brigham, PA-C  ?pantoprazole (PROTONIX) 20 MG tablet Take 1 tablet (20 mg total) by mouth daily. 03/23/21   Carlisle Cater, PA-C  ?rifabutin (MYCOBUTIN) 150 MG capsule Take 2 capsules (300 mg total) by mouth daily. 04/04/21 03/30/22  Jabier Mutton, MD  ?sulfamethoxazole-trimethoprim (BACTRIM) 400-80 MG  tablet Take 1 tablet by mouth daily. 04/04/21 03/30/22  Jabier Mutton, MD  ?  ? ?Family History  ?Problem Relation Age of Onset  ? Hypertension Mother   ? Kidney disease Mother   ? Hypertension Sister   ? Hypertension Brother   ? Kidney cancer Brother   ? Colon cancer Neg Hx   ? Liver cancer Neg Hx   ? Pancreatic cancer Neg Hx   ? Esophageal cancer Neg Hx   ? ? ?Social History  ? ?Socioeconomic History  ? Marital status: Married  ?  Spouse name: Not on file  ? Number of children: Not on file  ? Years of education: Not on file  ? Highest education level: Not on file  ?Occupational History  ? Not on file  ?Tobacco Use  ? Smoking status: Former  ?  Types: Cigarettes  ? Smokeless tobacco: Former  ? Tobacco comments:  ?  Smokes 1 - 2 cigarttes a day 04/18/21  ?Substance and Sexual Activity  ? Alcohol use: Yes  ?  Alcohol/week: 14.0 - 21.0 standard drinks  ?  Types: 14 - 21 Cans of beer per week  ? Drug use: Not Currently  ? Sexual activity: Not on file  ?Other Topics Concern  ? Not on file  ?Social History Narrative  ? Not on file  ? ?Social Determinants of Health  ? ?Financial Resource Strain: Not on file  ?Food Insecurity: Not on file  ?Transportation Needs: Not on file  ?Physical Activity: Not on file  ?Stress: Not on file  ?Social Connections: Not on file  ? ? ? ?Review of Systems: A 12 point ROS discussed and pertinent positives are indicated in the HPI above.  All other systems are negative. ? ?Vital Signs: ?BP (!) 136/91   Temp 97.7 ?F (36.5 ?C) (Oral)   Resp 17   Ht '6\' 1"'$  (1.854 m)   Wt 140 lb (63.5 kg)   BMI 18.47 kg/m?  ? ? ?Physical Exam ?Vitals reviewed.  ?Constitutional:   ?   General: He is not in acute distress. ?   Appearance: He is ill-appearing.  ?HENT:  ?   Head: Normocephalic and atraumatic.  ?   Mouth/Throat:  ?   Mouth: Mucous membranes are moist.  ?   Pharynx: Oropharynx is clear.  ?Cardiovascular:  ?   Rate and Rhythm: Normal rate and regular rhythm.  ?   Heart sounds: Normal heart sounds.   ?Pulmonary:  ?   Effort: Pulmonary effort is normal.  ?   Breath sounds: Normal breath sounds.  ?Abdominal:  ?   General: Abdomen is flat. Bowel sounds are normal.  ?   Palpations: Abdomen is soft.  ?Musculoskeletal:  ?   Cervical back: Neck supple.  ?Skin: ?   General: Skin is warm and dry.  ?   Coloration: Skin is not jaundiced or pale.  ?Neurological:  ?   Mental Status: He is alert and oriented to person, place, and time.  ?Psychiatric:     ?   Mood  and Affect: Mood normal.     ?   Behavior: Behavior normal.     ?   Judgment: Judgment normal.  ? ? ?MD Evaluation ?Airway: WNL ?Heart: WNL ?Abdomen: WNL ?Chest/ Lungs: WNL ?ASA  Classification: 3 ?Mallampati/Airway Score: One ? ?Imaging: ?No results found. ? ?Labs: ? ?CBC: ?Recent Labs  ?  03/23/21 ?2057 04/04/21 ?1207 04/05/21 ?1445 05/01/21 ?0867  ?WBC 3.6* 4.4 4.2 4.0  ?HGB 9.6* 8.6* 9.0* 8.5*  ?HCT 27.4* 24.0* 26.6* 24.7*  ?PLT 207 239 197 204  ? ? ?COAGS: ?Recent Labs  ?  05/01/21 ?0909  ?INR 1.5*  ? ? ?BMP: ?Recent Labs  ?  02/15/21 ?1750 03/15/21 ?6195 03/23/21 ?2057 04/04/21 ?1207 04/05/21 ?1445  ?NA 127* 129* 126* 130* 130*  ?K 4.9 3.4* 3.5 3.5 3.6  ?CL 95* 97* 96* 98 99  ?CO2 '24 22 22 25 25  '$ ?GLUCOSE 149* 101* 89 89 89  ?BUN 7* 7* 5* 7 10  ?CALCIUM 8.4* 7.5* 7.7* 7.3* 7.9*  ?CREATININE 0.84 0.89 0.69 0.68* 0.73  ?GFRNONAA >60 >60 >60  --  >60  ? ? ?LIVER FUNCTION TESTS: ?Recent Labs  ?  02/15/21 ?1750 03/15/21 ?0932 03/23/21 ?2057 04/04/21 ?1207 04/05/21 ?1445  ?BILITOT 1.2 0.7 0.6 1.5* 1.4*  ?AST 112* 65* 69* 58* 65*  ?ALT 32 '15 19 17 22  '$ ?ALKPHOS 518* 352* 419*  --  408*  ?PROT 7.6 6.3* 7.4 6.6 7.6  ?ALBUMIN 1.6* <1.5* <1.5*  --  1.7*  ? ? ?TUMOR MARKERS: ?No results for input(s): AFPTM, CEA, CA199, CHROMGRNA in the last 8760 hours. ? ?Assessment and Plan: ?66 y.o. male with extensive LAN concerning for malignancy such as lymphoma or leukemia, who is in need of tissue sampling for further eval/management.  ? ?IR was requested for image guided biopsy of  a left paraaortic LAN. ?Case was reviewed and approved by Dr. Laurence Ferrari.  ? ?NPO since MN ?VSS ?CBC stable ?INR 1.5 - borderline acceptable  ?Not on AC/AP ? ?Risks and benefits of left paraaortic LAN bx was discussed

## 2021-05-02 ENCOUNTER — Ambulatory Visit (HOSPITAL_COMMUNITY)
Admission: RE | Admit: 2021-05-02 | Discharge: 2021-05-02 | Disposition: A | Discharge status: Home / Self Care | Payer: Medicaid Other | Source: Ambulatory Visit | Attending: Pulmonary Disease | Admitting: Pulmonary Disease

## 2021-05-02 ENCOUNTER — Other Ambulatory Visit (HOSPITAL_COMMUNITY): Payer: Self-pay

## 2021-05-02 ENCOUNTER — Other Ambulatory Visit (HOSPITAL_COMMUNITY): Payer: Self-pay | Admitting: Physician Assistant

## 2021-05-02 DIAGNOSIS — R599 Enlarged lymph nodes, unspecified: Secondary | ICD-10-CM | POA: Diagnosis present

## 2021-05-02 LAB — GLUCOSE, CAPILLARY: Glucose-Capillary: 82 mg/dL (ref 70–99)

## 2021-05-02 IMAGING — PT NM PET TUM IMG INITIAL (PI) SKULL BASE T - THIGH
7 series · 25 of 25 positions shown · non-contrast
Comparison: Multiple prior CT evaluations most recent comparison CT
from [DATE].

CLINICAL DATA: Initial treatment strategy for suspected lymphoma in
a 65-year-old male.

EXAM:
NUCLEAR MEDICINE PET SKULL BASE TO THIGH
TECHNIQUE: 6.81 mCi F-18 FDG was injected intravenously. Full-ring PET imaging
was performed from the skull base to thigh after the radiotracer. CT
data was obtained and used for attenuation correction and anatomic
localization.
Fasting blood glucose: 82 mg/dl

[Series 3: pet sk_thigh ac · axial · 5.0mm · 4.07mm/px · z∈[-842,+50]mm · 6 of 224 slices shown]
[im 1/224]
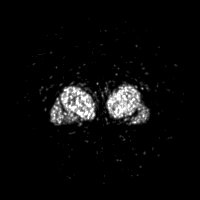
[im 45/224]
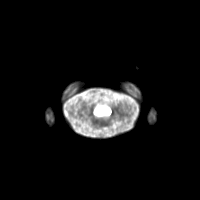
[im 90/224]
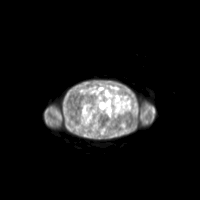
[im 134/224]
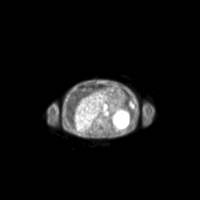
[im 179/224]
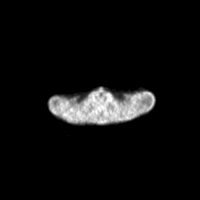
[im 224/224]
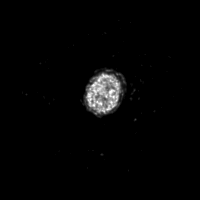

[Series 4: ct sk_thigh 5.0 hd_fov · axial · 5.0mm · 1.33mm/px · z∈[-842,+50]mm · 5 of 224 slices shown]
[im 1/224]
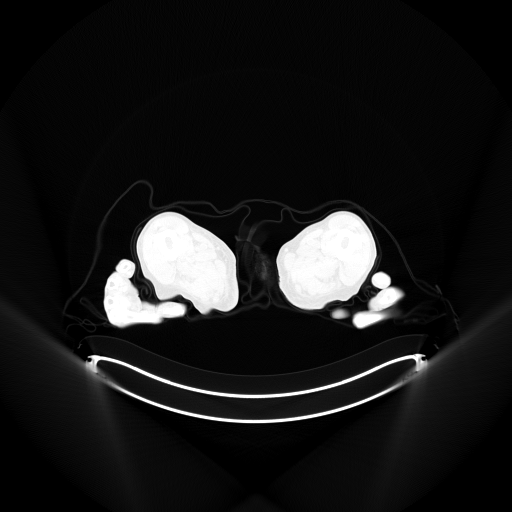
[im 56/224]
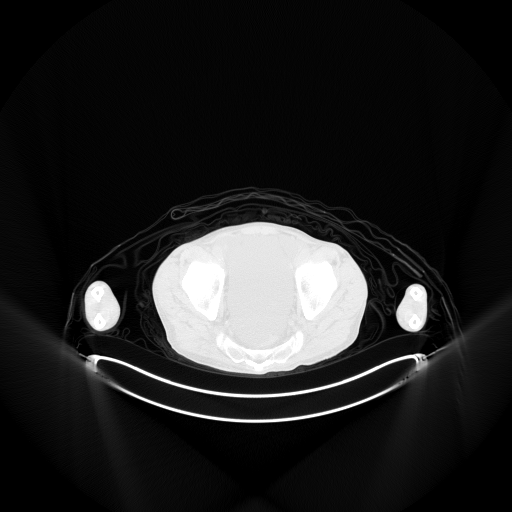
[im 112/224]
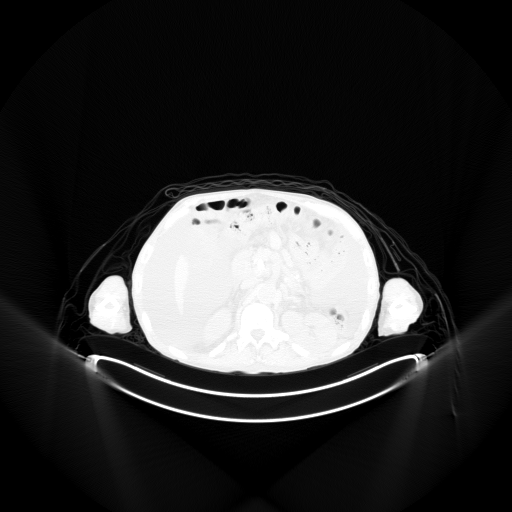
[im 168/224]
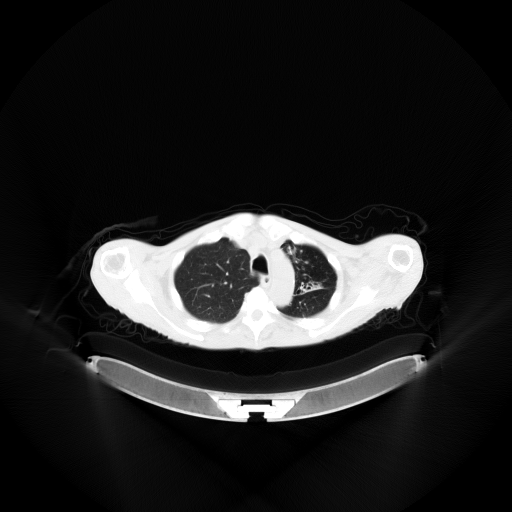
[im 224/224]
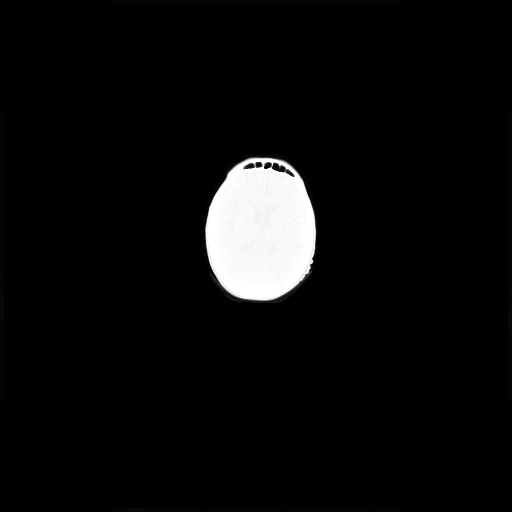

[Series 5: pet sk_thigh nac · axial · 5.0mm · 4.07mm/px · z∈[-842,+50]mm · 5 of 224 slices shown]
[im 1/224]
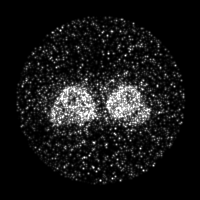
[im 56/224]
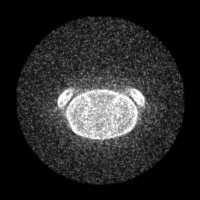
[im 112/224]
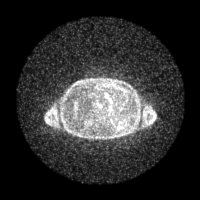
[im 168/224]
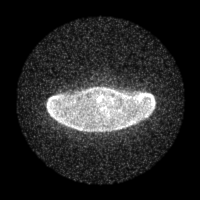
[im 224/224]
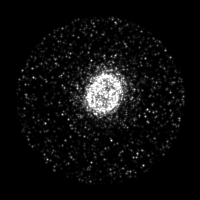

[Series 8: ct sk_thigh 5.0 br59 (id)_bone · axial · 5.0mm · 0.73mm/px · z∈[-378,-90]mm · 2 of 73 slices shown]
[im 1/73]
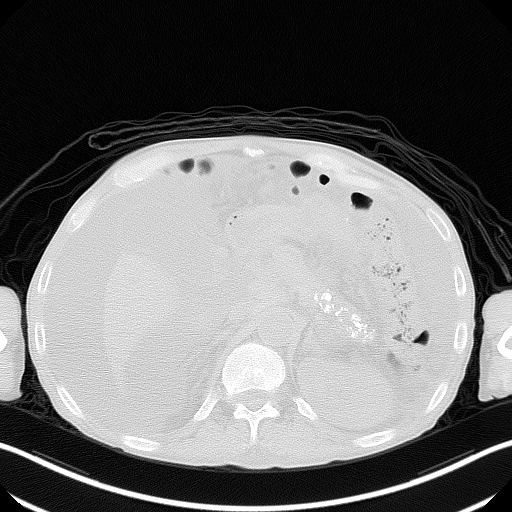
[im 73/73]
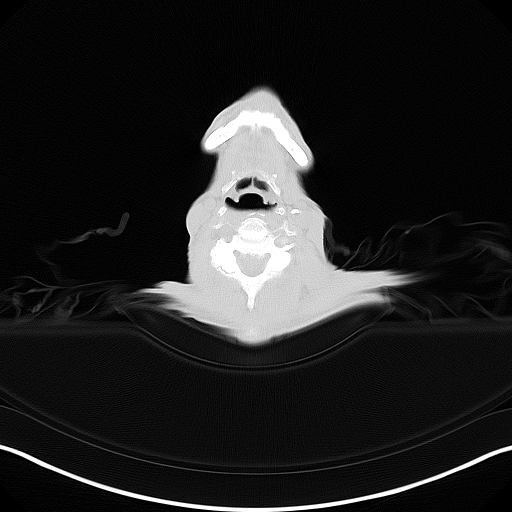

[Series 603: fused cor · 1 of 44 slices shown]
[im 1/44]
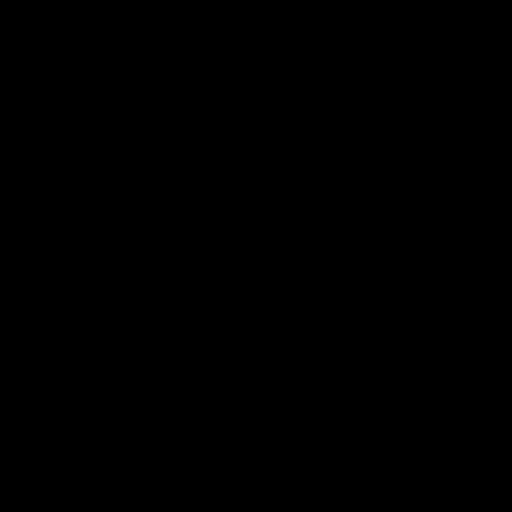

[Series 604: <mip collection> · coronal · 1.85mm/px · 1 of 32 slices shown]
[im 1/32]
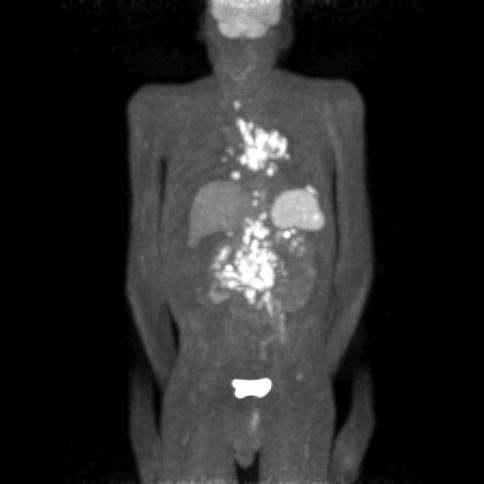

[Series 605: range-ct sk_thigh 5.0 hd_fov-tra-<alpha range> · 5 of 219 slices shown]
[im 1/219]
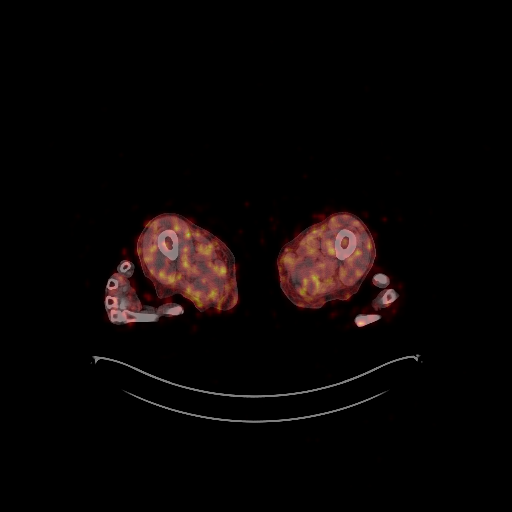
[im 55/219]
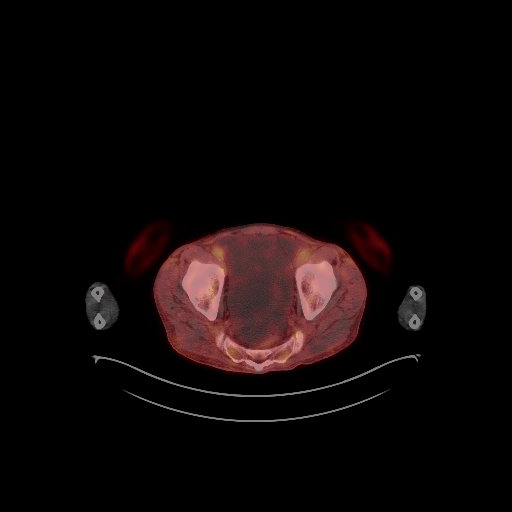
[im 110/219]
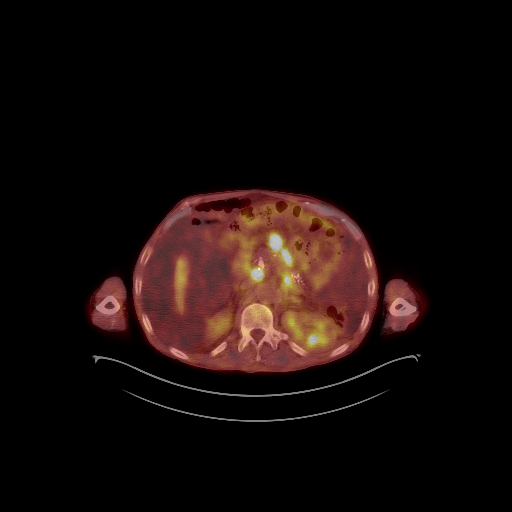
[im 164/219]
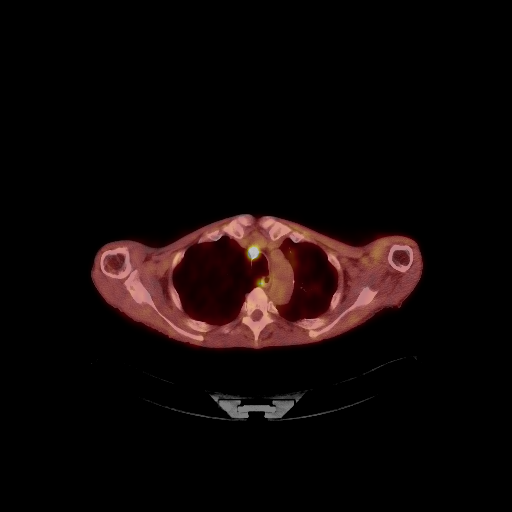
[im 219/219]
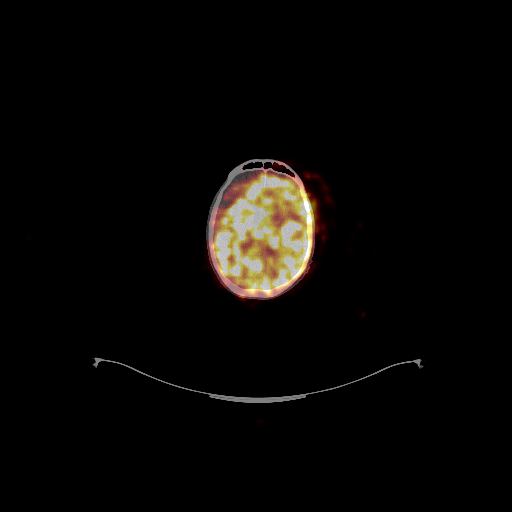

[25 of 25 positions shown; findings below may reference images not displayed]

FINDINGS: Mediastinal blood pool activity: SUV max

Liver activity: SUV max

NECK: No hypermetabolic lymph nodes in the neck.

Incidental CT findings: Extensive bilateral paranasal sinus disease
in the maxillary sinuses with marked mucosal thickening. No
increased metabolic activity.

CHEST: Bulky mediastinal adenopathy, LEFT hilar adenopathy, AP
window and prevascular adenopathy and RIGHT juxta cardiac and pre
pericardial adenopathy. For example, (image 73/4) 2.6 cm subcarinal
nodal tissue with a maximum SUV of 15.6.

(Image 61/4) RIGHT paratracheal lymph node with a maximum SUV of
13.8 showing short axis dimension of 1.7 cm. Similar activity in
pre-vascular, AP window, LEFT paratracheal and LEFT hilar nodal
tissue.

RIGHT juxta cardiac lymph node measuring 16 mm (image 82/4) similar
SUV values.

Small pre pericardial lymph node measuring less than a cm on image
95 also with intense radiotracer uptake.

Spiculated LEFT lower lobe pulmonary nodule may show some central
cavitation (image 88/4) similar to recent CT showing a maximum SUV
of 8.6. Other nodule seen on recent CT imaging without signs of
increased metabolic activity.

Incidental CT findings: Nodules in the LEFT upper lobe and LEFT
upper lobe scarring as on previous imaging studies also associated
with bronchiectatic changes.

Dominant LEFT paramediastinal nodule measuring 13 mm is unchanged
compared to recent CT imaging.

Small LEFT-sided pleural effusion.

ABDOMEN/PELVIS: Bulky mesenteric and retroperitoneal lymph nodes
remain enlarged and some with central low attenuation.

Intra-aortocaval lymph node (image 135/4) 2 cm short axis with a
maximum SUV of 14.6. Similarly hypermetabolic lymph nodes throughout
the retroperitoneum and small bowel mesentery.

LEFT para-aortic lymph node is diminished in size but was recently
biopsied showing short axis measurement now with 2.1 cm as compared
to 2.9 cm.

Small bowel mesentery with nodal tissue that may have enlarged
(image 131/4) 2.7 as compared to 2.3 cm showing a maximum SUV of
16.6. Other lymph nodes are largely stable.

The spleen is stable with respect to size as well measuring
approximately 9.6 cm greatest transverse dimension as compared to
9.5 cm previously but shows intense hypermetabolic activity with a
maximum SUV of 7.7 uniformly distributed throughout the spleen. No
pelvic adenopathy.

Incidental CT findings: No acute findings relative to liver,
gallbladder, pancreas, adrenal glands or kidneys. Spleen with
hypermetabolic changes. Pancreas with signs of chronic calcific
pancreatitis.

Signs of potential mild colonic thickening is noted though there is
an increase in general of ascites in the abdomen, this could be
related to generalized edema. No signs of small bowel dilation.

Aortic atherosclerosis.

SKELETON: No focal hypermetabolic activity to suggest skeletal
metastasis.

Incidental CT findings: Spinal degenerative changes. No acute or
destructive bone process.
IMPRESSION: Findings of suspected lymphoproliferative disorder with adenopathy
in the chest an abdomen and marked hypermetabolic features in the
spleen. [HOSPITAL] criteria 5 if this represents lymphoma.

LEFT lower lobe irregular nodule now with cavitation is the only
hypermetabolic pulmonary nodule. Given spiculated appearance on
previous imaging findings raise the question of bronchogenic
neoplasm versus is pulmonary involvement.

Increasing ascites, now moderately large volume, since previous
imaging and signs of bowel edema. Correlate with any
gastrointestinal symptoms. Bowel edema may relate to worsening
volume status but is not well assessed.

Signs of post infectious scarring and nodularity in the LEFT upper
lobe. Attention to nodular areas in the LEFT upper lobe on
subsequent imaging is suggested though these are currently without
increased metabolic activity.

Mild pulmonary emphysema.

Aortic atherosclerosis.

Aortic Atherosclerosis ([LY]-[LY]) and Emphysema ([LY]-[LY]).

## 2021-05-02 MED ORDER — FLUDEOXYGLUCOSE F - 18 (FDG) INJECTION
6.8100 | Freq: Once | INTRAVENOUS | Status: AC | PRN
  Administered 2021-05-02: 6.81 via INTRAVENOUS

## 2021-05-06 ENCOUNTER — Other Ambulatory Visit: Payer: Self-pay | Admitting: Physician Assistant

## 2021-05-06 ENCOUNTER — Encounter: Payer: Self-pay | Admitting: Internal Medicine

## 2021-05-06 ENCOUNTER — Telehealth: Payer: Self-pay

## 2021-05-06 ENCOUNTER — Ambulatory Visit: Payer: Medicaid Other

## 2021-05-06 DIAGNOSIS — Z515 Encounter for palliative care: Secondary | ICD-10-CM

## 2021-05-06 NOTE — Telephone Encounter (Signed)
Spoke with patient's sister Forbes Cellar and scheduled a telephonic Palliative Consult for 05/13/21 @ 1 PM. ? ?Consent obtained; updated Netsmart, Team List and Epic.  ? ?

## 2021-05-06 NOTE — Progress Notes (Signed)
COMMUNITY PALLIATIVE CARE SW NOTE ? ?PATIENT NAME: Ricky [name] ?DOB: [birth year] ?MRN: [id number] ? ?PRIMARY CARE PROVIDER: Pcp, No ? ?RESPONSIBLE PARTY:  ?Acct ID - Guarantor Home Phone Work Phone Relationship Acct Type  ?[id number] SANDER, REMEDIOS* [phone]  Self P/F  ?   [name], [place], [place] [address]  ? ?Due to the COVID-19 crisis, this virtual check-in visit was done via telephone from my office and it was initiated and consent by this patient and or family. ? ?SOCIAL WORK TELEPHONE ENCOUNTER ? ?PC SW completed telephonic encounter with patient's sister-[name] to assess any needs and to provide support. SW provided education to regarding palliative care and social work role in patient's care. SW inquired if she had any questions or concerns. She stated that they needed to get a healthcare power of attorney. SW advised that she can meet patient next week to complete. She stated that would be okay. [name] stated that she would like for patient to receive mobile meals. SW advised her that she could make a referral for mobile meals. She also inquired about transportation for patient to go to his doctor's appointment. Currently patient's infectious disease social worker coordinates patient's appointments. SW advised her that this may be the best way to coordinate this and so far it seems to be more efficient. [name] verbalized no other concerns. SW advised her that she will follow-up with her next week to completed HCPOA paperwork.   ? ? ?[name], LCSW ? ?

## 2021-05-07 ENCOUNTER — Ambulatory Visit (INDEPENDENT_AMBULATORY_CARE_PROVIDER_SITE_OTHER): Payer: Medicaid Other | Admitting: Internal Medicine

## 2021-05-07 ENCOUNTER — Other Ambulatory Visit: Payer: Self-pay

## 2021-05-07 ENCOUNTER — Encounter: Payer: Self-pay | Admitting: Internal Medicine

## 2021-05-07 ENCOUNTER — Ambulatory Visit (INDEPENDENT_AMBULATORY_CARE_PROVIDER_SITE_OTHER): Payer: Medicaid Other | Admitting: Pharmacist

## 2021-05-07 VITALS — BP 120/84 | Temp 97.6°F | Wt 140.2 lb

## 2021-05-07 DIAGNOSIS — B20 Human immunodeficiency virus [HIV] disease: Secondary | ICD-10-CM

## 2021-05-07 DIAGNOSIS — D479 Neoplasm of uncertain behavior of lymphoid, hematopoietic and related tissue, unspecified: Secondary | ICD-10-CM

## 2021-05-07 DIAGNOSIS — Z91199 Patient's noncompliance with other medical treatment and regimen due to unspecified reason: Secondary | ICD-10-CM

## 2021-05-07 DIAGNOSIS — A310 Pulmonary mycobacterial infection: Secondary | ICD-10-CM

## 2021-05-07 DIAGNOSIS — B451 Cerebral cryptococcosis: Secondary | ICD-10-CM

## 2021-05-07 NOTE — Addendum Note (Signed)
Addended by: Tomi Bamberger on: 05/07/2021 03:38 PM ? ? Modules accepted: Orders ? ?

## 2021-05-07 NOTE — Progress Notes (Signed)
? ?HPI: Ricky [name] is a 66 y.o. male who presents to the [facility] for HIV follow-up. ? ?Patient Active Problem List  ? Diagnosis Date Noted  ? Bladder wall thickening 04/08/2021  ? Abdominal pain 04/08/2021  ? Normocytic anemia 04/08/2021  ? Nausea with vomiting 04/08/2021  ? Fever   ? Acute cholecystitis 01/22/2021  ? Complicated UTI (urinary tract infection) 01/22/2021  ? Noncompliance 01/22/2021  ? Lymphadenopathy   ? Chronic low back pain   ? HIV infection ([place])   ? Community acquired pneumonia   ? Nonadherence to medical treatment   ? COVID-19 virus infection 10/13/2020  ? Alcohol abuse, daily use   ? COVID-19 10/12/2020  ? 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 Blurry vision, left eye   ? AKI (acute kidney injury) ([place])   ? Cavitary lesion of lung   ? Acute nonintractable headache   ? Smoking   ? Cocaine use   ? Paroxysmal SVT (supraventricular tachycardia) (HCC)   ? Abnormal EKG   ? Atrial flutter ([place]) 03/27/2020  ? Aortic atherosclerosis ([place]) 03/27/2020  ? Hypertension 03/27/2020  ? 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 Leukocytopenia 03/26/2020  ? Macrocytosis 03/26/2020  ? Moderate protein malnutrition ([place]) 03/26/2020  ? Hypoalbuminemia 03/26/2020  ? AIDS (acquired immune deficiency syndrome) ([place]) 03/26/2020  ? ? ?Patient's Medications  ?New Prescriptions  ? No medications on file  ?Previous Medications  ? ACETAMINOPHEN (TYLENOL) 500 MG TABLET    Take 500 mg by mouth every 6 (six) hours as needed for moderate pain or headache.  ? AZITHROMYCIN (ZITHROMAX) 500 MG TABLET    Take 1 tablet (500 mg total) by mouth daily.  ? CEPHALEXIN (KEFLEX) 500 MG CAPSULE    Take 500 mg by mouth every 6 (six) hours.  ? DOLUTEGRAVIR (TIVICAY) 50 MG TABLET    Take 1 tablet (50 mg total) by mouth daily.  ? EMTRICITABINE-TENOFOVIR (TRUVADA) 200-300 MG TABLET    Take 1 tablet by mouth daily.  ? ETHAMBUTOL (MYAMBUTOL) 400 MG TABLET    Take 2.5 tablets (1,000 mg total) by  mouth daily.  ? FLUCONAZOLE (DIFLUCAN) 200 MG TABLET    Take 1 tablet (200 mg total) by mouth daily.  ? FLUTICASONE (FLONASE) 50 MCG/ACT NASAL SPRAY    Place 1 spray into both nostrils as needed for allergies or rhinitis.  ? FOLIC ACID (FOLVITE) 1 MG TABLET    Take 1 tablet (1 mg total) by mouth daily.  ? MIRTAZAPINE (REMERON) 15 MG TABLET    Take 1 tablet  by mouth at bedtime.  ? ONDANSETRON (ZOFRAN-ODT) 4 MG DISINTEGRATING TABLET    Take 1 tablet (4 mg total) by mouth every 8 (eight) hours as needed for nausea or vomiting.  ? OXYCODONE (OXY IR/ROXICODONE) 5 MG IMMEDIATE RELEASE TABLET    Take 1-2 tablets (5-10 mg total) by mouth every 6 (six) hours as needed for severe pain.  ? OXYCODONE-ACETAMINOPHEN (PERCOCET) 10-325 MG TABLET    Take 1 tablet by mouth every 4 (four) hours as needed for pain.  ? PANTOPRAZOLE (PROTONIX) 20 MG TABLET    Take 1 tablet (20 mg total) by mouth daily.  ? RIFABUTIN (MYCOBUTIN) 150 MG CAPSULE    Take 2 capsules (300 mg total) by mouth daily.  ? SULFAMETHOXAZOLE-TRIMETHOPRIM (BACTRIM) 400-80 MG TABLET    Take 1 tablet by mouth daily.  ?Modified Medications  ? No medications on file  ?Discontinued Medications  ? No medications on file  ? ? ?  Allergies: ?Allergies  ?Allergen Reactions  ? Bactrim [Sulfamethoxazole-Trimethoprim] Other (See Comments)  ?  Patient was trialed on DS TIW and SS daily for PJP prophylaxis and developed hyperkalemia and increased Scr  ? ? ?Past Medical History: ?Past Medical History:  ?Diagnosis Date  ? Human immunodeficiency virus (HIV) disease ([place]) 03/26/2020  ? ? ?Social History: ?Social History  ? ?Socioeconomic History  ? Marital status: Married  ?  Spouse name: Not on file  ? Number of children: Not on file  ? Years of education: Not on file  ? Highest education level: Not on file  ?Occupational History  ? Not on file  ?Tobacco Use  ? Smoking status: Former  ?  Types: Cigarettes  ? Smokeless tobacco: Former  ? Tobacco comments:  ?  Smokes 1 - 2 cigarttes a day  04/18/21  ?Substance and Sexual Activity  ? Alcohol use: Yes  ?  Alcohol/week: 14.0 - 21.0 standard drinks  ?  Types: 14 - 21 Cans of beer per week  ? Drug use: Not Currently  ? Sexual activity: Not on file  ?Other Topics Concern  ? Not on file  ?Social History Narrative  ? Not on file  ? ?Social Determinants of Health  ? ?Financial Resource Strain: Not on file  ?Food Insecurity: Not on file  ?Transportation Needs: Not on file  ?Physical Activity: Not on file  ?Stress: Not on file  ?Social Connections: Not on file  ? ? ?Labs: ?Lab Results  ?Component Value Date  ? HIV1RNAQUANT 105,000 (H) 04/04/2021  ? HIV1RNAQUANT 111,000 01/22/2021  ? HIV1RNAQUANT 25,300 03/27/2020  ? CD4TABS <35 (L) 01/22/2021  ? CD4TABS <35 (L) 10/15/2020  ? ? ?RPR and STI ?Lab Results  ?Component Value Date  ? LABRPR NON-REACTIVE 04/04/2021  ? LABRPR NON REACTIVE 10/16/2020  ? ? ?STI Results GC CT  ?10/15/2020 ? 3:54 PM Negative   Negative    ? ? ?Hepatitis B ?Lab Results  ?Component Value Date  ? HEPBSAG NON REACTIVE 10/16/2020  ? HEPBCAB Reactive (A) 04/03/2020  ? ?Hepatitis C ?No results found for: [place], HCVRNAPCRQN ?Hepatitis A ?Lab Results  ?Component Value Date  ? HAV Reactive (A) 04/03/2020  ? ?Lipids: ?Lab Results  ?Component Value Date  ? CHOL 141 03/29/2020  ? TRIG 118 03/29/2020  ? HDL 31 (L) 03/29/2020  ? CHOLHDL 4.5 03/29/2020  ? VLDL 24 03/29/2020  ? [place] 86 03/29/2020  ? ? ?Current HIV Regimen: ?Tivicay + Truvada ? ?Assessment: ?Ricky [name] presents to clinic today for HIV follow-up. His genotype from last visit did not result with any resistance mutations that confer drug resistance. He reports still being in pain and very fatigued. He did not have his pill boxes with him today but states he has been taking his medicines everyday. Given I cannot assess his adherence from his pillboxes, I worry he is not taking his medication. He remains disengaged during our visit today. Provided him with new pillboxes filled with 2 weeks worth of  medicine. He verbalized that he would bring his pillboxes back with him next time. He says he filled his mirtazapine and that it has not helped with his appetite; however, I do not see that it was every filled. Will have it couriered over from [facility] and provide it to him at his next visit in 2 weeks.  ? ?Will continue patient on once daily Tivicay and Truvada for HIV as well as fluconazole and Bactrim for prophylaxis given low CD4. Also will continue ethambutol,  rifabutin, and azithromycin for disseminated MAC and mirtazapine for appetite stimulation. Patient will need to show attempt for payment of current [facility] medications prior to meeting predefined threshold for filling medications on payment account. Dr. Gale Journey collected labs today as well to assess patient's ongoing status. ? ?Plan: ?Fill pillboxes ?Follow-up with me in 2 weeks  ? ?Alfonse Spruce, PharmD, CPP ?Clinical Pharmacist Practitioner ?Infectious Diseases Clinical Pharmacist ?[facility] ?05/07/2021, 4:49 PM ? ? ?  ?

## 2021-05-07 NOTE — Patient Instructions (Signed)
Please see me again in 1 month ?MAKE SURE YOU TAKE YOUR CURRENT MEDICATIONS AS PRESCRIBED ?Will get labs today ? ? ?*you'll need to see pharmacy team every 2 weeks to help with medication ? ? ?I have also refer you to palliative care team to help with pain control ? ? ?We are awaiting the biopsy result of your lymph node as well ? ? ? ?

## 2021-05-07 NOTE — Progress Notes (Signed)
?  ? ? ? ? ?[facility] ? ?Patient Active Problem List  ? Diagnosis Date Noted  ? Bladder wall thickening 04/08/2021  ? Abdominal pain 04/08/2021  ? Normocytic anemia 04/08/2021  ? Nausea with vomiting 04/08/2021  ? Fever   ? Acute cholecystitis 01/22/2021  ? Complicated UTI (urinary tract infection) 01/22/2021  ? Noncompliance 01/22/2021  ? Lymphadenopathy   ? Chronic low back pain   ? HIV infection ([place])   ? Community acquired pneumonia   ? Nonadherence to medical treatment   ? COVID-19 virus infection 10/13/2020  ? Alcohol abuse, daily use   ? COVID-19 10/12/2020  ? 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 Blurry vision, left eye   ? AKI (acute kidney injury) ([place])   ? Cavitary lesion of lung   ? Acute nonintractable headache   ? Smoking   ? Cocaine use   ? Paroxysmal SVT (supraventricular tachycardia) (HCC)   ? Abnormal EKG   ? Atrial flutter ([place]) 03/27/2020  ? Aortic atherosclerosis ([place]) 03/27/2020  ? Hypertension 03/27/2020  ? 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 Leukocytopenia 03/26/2020  ? Macrocytosis 03/26/2020  ? Moderate protein malnutrition ([place]) 03/26/2020  ? Hypoalbuminemia 03/26/2020  ? AIDS (acquired immune deficiency syndrome) ([place]) 03/26/2020  ? ? ? ? ?Subjective:  ? ? Patient ID: Ricky [name], male    DOB: 1955/02/11, 66 y.o.   MRN: [id number] ? ?Chief Complaint  ?Patient presents with  ? Follow-up  ?  Cryptococcal meningitis - patient reports his stomach hurts all the time and fatigue.   ? ?Cc- f/u hiv care ? ?HPI: ? ?Ricky [name] is a 66 y.o. 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 ?05/07/21 id clinic f/u ?He had recent left retroperitoneal lymph node biopsy ct guided core needle on 3/29;  path is pending. He has a pet scan that showed pet avid splenic and diffuse mediastinal/retroperitoneal/abd lymphadenopathy along with right lung spiculated pet avid nodule. These were with the malignancy workup group at [facility].  ? ?He has been having severe abd pain radiating to the back (not midline), and just asked to take pain meds, not taking any ART or MAC meds or even fluconazole maintenance dose ? ? ?Lab Results  ?Component Value Date  ? HIV1RNAQUANT 105,000 (H) 04/04/2021  ? ?Lab Results  ?Component Value Date  ? CD4TCELL 3 (L) 01/22/2021  ? CD4TABS <35 (L) 01/22/2021  ? ? ?I was able to get genotype last visit but patient wasn't taking art consistently (no integrase strand inhibitor, nrti, nnrti, or pi resistance) ? 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 He does daily white mucus cough, but complains of sob. He reports fatigue, generalized weakness, decreased appetite, cold/chill. ? ?He's constipated but passing gas. Not complaining of nausea ? ?He denies current headache ? ?He obviously have lots of weight loss based on temporal muscle wasting ? ?Chronic bilateral feet numbness. No focal weakness/numbness/tingling otherwise. ? ? ?Patient said he has been taking the meds several pills that pharmacy gave him last time. He missed a dose. ? ?He is not yet ready for hospice. He said he wants to live ? ? ?He last saw me 04/04/21: ?--------------- ?I am familiar with him via multiple previous hospital admissions. Please refer to inpatient consult for detail ? 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 ?He was dx'ed with chronic covid infection ?We restarted him on biktarvy and have him followed up with clinic. During that admission his afb blood culture also returned positive (S macrolide) (several weeks post discharge) ? ?He is here for  establishing long term care and initial hospital follow up. ?He said he has taken himself off all medication (biktarvy/fluconazole/bactrim) 1 month prior to this visit ? ?He complains of ongoing abd pain, subjective fever, dry cough, malaise, poor appetite ? ?No headache, visual change, rash, diarrhea, black/bloody stool, focal joint pain, dysuria, penile discharge ? ?He is currently living with his sister. His brother takes him to ID clinic today 04/04/21 ? ?He self referred to GI 04/03/21. Due to concern of lymphoma he was referred by GI to cancer center. No endscopy was done ? ? ?He has known compliance issue/pattern as above.  ? ?Other relevant info: ?Hiv: off and on meds (mostly only take brief duration of ART biktarvy during previous admissions then off after discharged). Initial 03/2020 genotype including integrase testing all sensitive ?Initial dx crypto meningitis 03/2020 s/p induction, discharged didn't take consolidation fluconazole. Readmitted 10/2020 with repeat induction, transitioned to consolidation fluconazole, but after discharge stopped medication again until 01/2021 admission mentioned above ?Hx TB per prior ID consult note s/p treatment, persistant cavitary changes ?Hx mac in sputum thought colonized, but 01/2021 admission due to persistent fever (confounded by covid infection) blood afb sent and was positive ? ? ? ?Allergies  ?Allergen Reactions  ? Bactrim [Sulfamethoxazole-Trimethoprim] Other (See Comments)  ?  Patient was trialed on DS TIW and SS daily for PJP prophylaxis and developed hyperkalemia and increased Scr  ? ? ? ? ?Outpatient Medications Prior to Visit  ?Medication Sig Dispense Refill  ? acetaminophen (TYLENOL) 500 MG tablet Take 500 mg by mouth every 6 (six) hours as needed for moderate pain or headache.    ? azithromycin (ZITHROMAX) 500 MG tablet Take 1 tablet (500 mg total) by mouth daily. 30 tablet 11  ? cephALEXin (KEFLEX) 500 MG capsule Take 500 mg by mouth every 6 (six) hours.    ?  dolutegravir (TIVICAY) 50 MG tablet Take 1 tablet (50 mg total) by mouth daily. 30 tablet 11  ? emtricitabine-tenofovir (TRUVADA) 200-300 MG tablet Take 1 tablet by mouth daily. 30 tablet 11  ? ethambutol (MYAMBUTOL) 400 MG tablet Take 2.5 tablets (1,000 mg total) by mouth daily. 75 tablet 11  ? fluconazole (DIFLUCAN) 200 MG tablet Take 1 tablet (200 mg total) by mouth daily. 30 tablet 11  ? fluticasone (FLONASE) 50 MCG/ACT nasal spray Place 1 spray into both nostrils as needed for allergies or rhinitis. (Patient not taking: Reported on 78/67/6720) 16 g 2  ? folic acid (FOLVITE) 1 MG tablet Take 1 tablet (1 mg total) by mouth daily. 90 tablet 0  ? mirtazapine (REMERON) 15 MG tablet Take 1 tablet  by mouth at bedtime. 30 tablet 1  ? ondansetron (ZOFRAN-ODT) 4 MG disintegrating tablet Take 1 tablet (4 mg total) by mouth every 8 (eight) hours as needed for nausea or vomiting. 10 tablet 0  ? oxyCODONE (OXY IR/ROXICODONE) 5 MG immediate release tablet Take 1-2 tablets (5-10 mg total) by mouth every 6 (six) hours as needed for severe pain. 90 tablet 0  ? oxyCODONE-acetaminophen (PERCOCET) 10-325 MG tablet Take 1 tablet by mouth every 4 (four)  hours as needed for pain.    ? pantoprazole (PROTONIX) 20 MG tablet Take 1 tablet (20 mg total) by mouth daily. 30 tablet 0  ? rifabutin (MYCOBUTIN) 150 MG capsule Take 2 capsules (300 mg total) by mouth daily. 60 capsule 11  ? sulfamethoxazole-trimethoprim (BACTRIM) 400-80 MG tablet Take 1 tablet by mouth daily. 30 tablet 11  ? ?No facility-administered medications prior to visit.  ? ? ? ?Social History  ? ?Socioeconomic History  ? Marital status: Married  ?  Spouse name: Not on file  ? Number of children: Not on file  ? Years of education: Not on file  ? Highest education level: Not on file  ?Occupational History  ? Not on file  ?Tobacco Use  ? Smoking status: Former  ?  Types: Cigarettes  ? Smokeless tobacco: Former  ? Tobacco comments:  ?  Smokes 1 - 2 cigarttes a day 04/18/21   ?Substance and Sexual Activity  ? Alcohol use: Yes  ?  Alcohol/week: 14.0 - 21.0 standard drinks  ?  Types: 14 - 21 Cans of beer per week  ? Drug use: Not Currently  ? Sexual activity: Not on file  ?Other Topic

## 2021-05-08 ENCOUNTER — Other Ambulatory Visit (HOSPITAL_COMMUNITY): Payer: Self-pay

## 2021-05-09 ENCOUNTER — Telehealth: Payer: Self-pay | Admitting: Internal Medicine

## 2021-05-09 ENCOUNTER — Inpatient Hospital Stay (HOSPITAL_COMMUNITY)
Admission: AD | Admit: 2021-05-09 | Discharge: 2021-05-21 | DRG: 974 | Disposition: A | Discharge status: Skilled Nursing Facility | Payer: Medicaid Other | Attending: Internal Medicine | Admitting: Internal Medicine

## 2021-05-09 ENCOUNTER — Telehealth: Payer: Self-pay | Admitting: Physician Assistant

## 2021-05-09 DIAGNOSIS — D708 Other neutropenia: Secondary | ICD-10-CM | POA: Diagnosis not present

## 2021-05-09 DIAGNOSIS — R200 Anesthesia of skin: Secondary | ICD-10-CM | POA: Diagnosis present

## 2021-05-09 DIAGNOSIS — R1310 Dysphagia, unspecified: Secondary | ICD-10-CM | POA: Diagnosis present

## 2021-05-09 DIAGNOSIS — A312 Disseminated mycobacterium avium-intracellulare complex (DMAC): Secondary | ICD-10-CM | POA: Diagnosis present

## 2021-05-09 DIAGNOSIS — Z8051 Family history of malignant neoplasm of kidney: Secondary | ICD-10-CM | POA: Diagnosis not present

## 2021-05-09 DIAGNOSIS — Z841 Family history of disorders of kidney and ureter: Secondary | ICD-10-CM

## 2021-05-09 DIAGNOSIS — B451 Cerebral cryptococcosis: Secondary | ICD-10-CM | POA: Diagnosis present

## 2021-05-09 DIAGNOSIS — Z681 Body mass index (BMI) 19 or less, adult: Secondary | ICD-10-CM

## 2021-05-09 DIAGNOSIS — Z515 Encounter for palliative care: Secondary | ICD-10-CM | POA: Diagnosis not present

## 2021-05-09 DIAGNOSIS — D649 Anemia, unspecified: Secondary | ICD-10-CM | POA: Diagnosis not present

## 2021-05-09 DIAGNOSIS — Z79899 Other long term (current) drug therapy: Secondary | ICD-10-CM

## 2021-05-09 DIAGNOSIS — Z8611 Personal history of tuberculosis: Secondary | ICD-10-CM

## 2021-05-09 DIAGNOSIS — D72819 Decreased white blood cell count, unspecified: Secondary | ICD-10-CM | POA: Diagnosis present

## 2021-05-09 DIAGNOSIS — E871 Hypo-osmolality and hyponatremia: Secondary | ICD-10-CM | POA: Diagnosis present

## 2021-05-09 DIAGNOSIS — D638 Anemia in other chronic diseases classified elsewhere: Secondary | ICD-10-CM | POA: Diagnosis present

## 2021-05-09 DIAGNOSIS — E8809 Other disorders of plasma-protein metabolism, not elsewhere classified: Secondary | ICD-10-CM | POA: Diagnosis present

## 2021-05-09 DIAGNOSIS — E878 Other disorders of electrolyte and fluid balance, not elsewhere classified: Secondary | ICD-10-CM | POA: Diagnosis present

## 2021-05-09 DIAGNOSIS — A4189 Other specified sepsis: Principal | ICD-10-CM | POA: Diagnosis present

## 2021-05-09 DIAGNOSIS — R627 Adult failure to thrive: Secondary | ICD-10-CM | POA: Diagnosis not present

## 2021-05-09 DIAGNOSIS — A439 Nocardiosis, unspecified: Secondary | ICD-10-CM | POA: Diagnosis not present

## 2021-05-09 DIAGNOSIS — R5381 Other malaise: Secondary | ICD-10-CM | POA: Diagnosis present

## 2021-05-09 DIAGNOSIS — Z8249 Family history of ischemic heart disease and other diseases of the circulatory system: Secondary | ICD-10-CM | POA: Diagnosis not present

## 2021-05-09 DIAGNOSIS — A310 Pulmonary mycobacterial infection: Secondary | ICD-10-CM | POA: Diagnosis present

## 2021-05-09 DIAGNOSIS — R188 Other ascites: Secondary | ICD-10-CM | POA: Diagnosis present

## 2021-05-09 DIAGNOSIS — R64 Cachexia: Secondary | ICD-10-CM | POA: Diagnosis present

## 2021-05-09 DIAGNOSIS — Z87891 Personal history of nicotine dependence: Secondary | ICD-10-CM | POA: Diagnosis not present

## 2021-05-09 DIAGNOSIS — Z91148 Patient's other noncompliance with medication regimen for other reason: Secondary | ICD-10-CM

## 2021-05-09 DIAGNOSIS — D696 Thrombocytopenia, unspecified: Secondary | ICD-10-CM | POA: Diagnosis present

## 2021-05-09 DIAGNOSIS — U071 COVID-19: Secondary | ICD-10-CM | POA: Diagnosis present

## 2021-05-09 DIAGNOSIS — B20 Human immunodeficiency virus [HIV] disease: Secondary | ICD-10-CM | POA: Diagnosis present

## 2021-05-09 DIAGNOSIS — K219 Gastro-esophageal reflux disease without esophagitis: Secondary | ICD-10-CM

## 2021-05-09 DIAGNOSIS — Z7189 Other specified counseling: Secondary | ICD-10-CM | POA: Diagnosis not present

## 2021-05-09 DIAGNOSIS — A318 Other mycobacterial infections: Secondary | ICD-10-CM | POA: Diagnosis present

## 2021-05-09 DIAGNOSIS — K4090 Unilateral inguinal hernia, without obstruction or gangrene, not specified as recurrent: Secondary | ICD-10-CM | POA: Diagnosis present

## 2021-05-09 DIAGNOSIS — E43 Unspecified severe protein-calorie malnutrition: Secondary | ICD-10-CM | POA: Insufficient documentation

## 2021-05-09 DIAGNOSIS — Z789 Other specified health status: Secondary | ICD-10-CM | POA: Diagnosis not present

## 2021-05-09 DIAGNOSIS — Z888 Allergy status to other drugs, medicaments and biological substances status: Secondary | ICD-10-CM | POA: Diagnosis not present

## 2021-05-09 DIAGNOSIS — R590 Localized enlarged lymph nodes: Secondary | ICD-10-CM | POA: Diagnosis present

## 2021-05-09 LAB — COMPREHENSIVE METABOLIC PANEL
ALT: 37 U/L (ref 0–44)
AST: 65 U/L — ABNORMAL HIGH (ref 15–41)
Albumin: <1.5 g/dL — ABNORMAL LOW (ref 3.5–5.0)
Alkaline Phosphatase: 184 U/L — ABNORMAL HIGH (ref 38–126)
Anion gap: 4 — ABNORMAL LOW (ref 5–15)
BUN: 15 mg/dL (ref 8–23)
CO2: 24 mmol/L (ref 22–32)
Calcium: 7.6 mg/dL — ABNORMAL LOW (ref 8.9–10.3)
Chloride: 97 mmol/L — ABNORMAL LOW (ref 98–111)
Creatinine, Ser: 1.17 mg/dL (ref 0.61–1.24)
GFR, Estimated: >60 mL/min (ref >60)
Glucose, Bld: 74 mg/dL (ref 70–99)
Potassium: 4.3 mmol/L (ref 3.5–5.1)
Sodium: 125 mmol/L — ABNORMAL LOW (ref 135–145)
Total Bilirubin: 1.8 mg/dL — ABNORMAL HIGH (ref 0.3–1.2)
Total Protein: 6.1 g/dL — ABNORMAL LOW (ref 6.5–8.1)

## 2021-05-09 LAB — MAGNESIUM: Magnesium: 1.8 mg/dL (ref 1.7–2.4)

## 2021-05-09 LAB — PROTIME-INR
INR: 1.4 — ABNORMAL HIGH (ref 0.8–1.2)
Prothrombin Time: 16.8 seconds — ABNORMAL HIGH (ref 11.4–15.2)

## 2021-05-09 LAB — APTT: aPTT: 37 seconds — ABNORMAL HIGH (ref 24–36)

## 2021-05-09 MED ORDER — SODIUM CHLORIDE 0.9 % IV SOLN: INTRAVENOUS | Status: DC

## 2021-05-09 MED ORDER — ONDANSETRON HCL 4 MG PO TABS
4.0000 mg | ORAL_TABLET | Freq: Four times a day (QID) | ORAL | Status: DC | PRN
Start: 2021-05-09 — End: 2021-05-22

## 2021-05-09 MED ORDER — ACETAMINOPHEN 650 MG RE SUPP: 650.0000 mg | Freq: Four times a day (QID) | RECTAL | Status: DC | PRN

## 2021-05-09 MED ORDER — RIFABUTIN 150 MG PO CAPS
300.0000 mg | ORAL_CAPSULE | Freq: Every day | ORAL | Status: DC
  Administered 2021-05-10 – 2021-05-21 (×12): 300 mg via ORAL
  Filled 2021-05-09 (×12): qty 2

## 2021-05-09 MED ORDER — ENOXAPARIN SODIUM 40 MG/0.4ML IJ SOSY
40.0000 mg | PREFILLED_SYRINGE | INTRAMUSCULAR | Status: DC
  Administered 2021-05-10 – 2021-05-20 (×11): 40 mg via SUBCUTANEOUS
  Filled 2021-05-09 (×11): qty 0.4

## 2021-05-09 MED ORDER — LABETALOL HCL 5 MG/ML IV SOLN: 20.0000 mg | INTRAVENOUS | Status: DC | PRN

## 2021-05-09 MED ORDER — ACETAMINOPHEN 325 MG PO TABS: 650.0000 mg | ORAL_TABLET | Freq: Four times a day (QID) | ORAL | Status: DC | PRN

## 2021-05-09 MED ORDER — FOLIC ACID 1 MG PO TABS
1.0000 mg | ORAL_TABLET | Freq: Every day | ORAL | Status: DC
  Administered 2021-05-10 – 2021-05-20 (×11): 1 mg via ORAL
  Filled 2021-05-09 (×13): qty 1

## 2021-05-09 MED ORDER — ONDANSETRON HCL 4 MG/2ML IJ SOLN: 4.0000 mg | Freq: Four times a day (QID) | INTRAMUSCULAR | Status: DC | PRN

## 2021-05-09 MED ORDER — PANTOPRAZOLE SODIUM 20 MG PO TBEC
20.0000 mg | DELAYED_RELEASE_TABLET | Freq: Every day | ORAL | Status: DC
  Administered 2021-05-10 – 2021-05-20 (×10): 20 mg via ORAL
  Filled 2021-05-09 (×12): qty 1

## 2021-05-09 MED ORDER — AZITHROMYCIN 500 MG PO TABS
500.0000 mg | ORAL_TABLET | Freq: Every day | ORAL | Status: DC
  Administered 2021-05-10 – 2021-05-21 (×12): 500 mg via ORAL
  Filled 2021-05-09 (×13): qty 1

## 2021-05-09 MED ORDER — SULFAMETHOXAZOLE-TRIMETHOPRIM 400-80 MG PO TABS
1.0000 | ORAL_TABLET | Freq: Every day | ORAL | Status: DC
  Administered 2021-05-10 – 2021-05-20 (×10): 1 via ORAL
  Filled 2021-05-09 (×12): qty 1

## 2021-05-09 MED ORDER — TRAZODONE HCL 50 MG PO TABS: 25.0000 mg | ORAL_TABLET | Freq: Every evening | ORAL | Status: DC | PRN

## 2021-05-09 MED ORDER — DOLUTEGRAVIR SODIUM 50 MG PO TABS
50.0000 mg | ORAL_TABLET | Freq: Every day | ORAL | Status: DC
  Administered 2021-05-10 – 2021-05-20 (×11): 50 mg via ORAL
  Filled 2021-05-09 (×13): qty 1

## 2021-05-09 MED ORDER — ONDANSETRON 4 MG PO TBDP: 4.0000 mg | ORAL_TABLET | Freq: Three times a day (TID) | ORAL | Status: DC | PRN

## 2021-05-09 MED ORDER — MIRTAZAPINE 15 MG PO TABS
15.0000 mg | ORAL_TABLET | Freq: Every day | ORAL | Status: DC
  Administered 2021-05-09 – 2021-05-21 (×13): 15 mg via ORAL
  Filled 2021-05-09 (×13): qty 1

## 2021-05-09 MED ORDER — EMTRICITABINE-TENOFOVIR AF 200-25 MG PO TABS
1.0000 | ORAL_TABLET | Freq: Every day | ORAL | Status: DC
  Administered 2021-05-10: 1 via ORAL
  Filled 2021-05-09: qty 1

## 2021-05-09 MED ORDER — MAGNESIUM HYDROXIDE 400 MG/5ML PO SUSP
30.0000 mL | Freq: Every day | ORAL | Status: DC | PRN
  Administered 2021-05-10: 30 mL via ORAL
  Filled 2021-05-09: qty 30

## 2021-05-09 MED ORDER — OXYCODONE HCL 5 MG PO TABS
5.0000 mg | ORAL_TABLET | Freq: Four times a day (QID) | ORAL | Status: DC | PRN
  Administered 2021-05-09 – 2021-05-12 (×5): 10 mg via ORAL
  Filled 2021-05-09 (×5): qty 2

## 2021-05-09 MED ORDER — ETHAMBUTOL HCL 400 MG PO TABS
1000.0000 mg | ORAL_TABLET | Freq: Every day | ORAL | Status: DC
  Administered 2021-05-10 – 2021-05-20 (×11): 1000 mg via ORAL
  Filled 2021-05-09 (×12): qty 2

## 2021-05-09 NOTE — Telephone Encounter (Signed)
3/29 retroperitoneal lymph node biopsy ? ?FINAL MICROSCOPIC DIAGNOSIS:  ? ?A. LYMPH NODE, LEFT RETROPERITONEAL, NEEDLE CORE BIOPSY:  ?-  Granulomatous inflammation with numerous intracellular filamentous  ?organisms  ?-  No malignancy identified  ?-  See comment  ? ?COMMENT:  ? ?The organisms are positive for AFB, Fite, GMS and PAS.  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  Dr. Gari Crown reviewed the case and  ?agrees with the above diagnosis.  ? ? 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 ? ?I called patient about serious nature overall and with this new infection ? ?He'll need to have extensive further workup including at least a brain mri. ? ?He'd benefit from a full excision lymph node biopsy as well to look for cancer/lymphoma ? ?Unfortunately it doesn't appear the lymph node tissue were sent for afb/fungal/bacterial culture. ? ?We'll need sensitivity data to help guide nocardia treatment. So as above another lymph node biopsy vs bronch ? ? ?He is currently rx'ed truvada/tivicay with rifabutin/ethambutol/azithromycin, fluconazole, and bactrim but unclear if he is taking this at home ? ?He has severe abd pain ?lymphadenitis vs mucosal disease ? ? ?Care for him has been very difficult, but at this time he'll need inpatient care for treatment, and if he desires should be placed in an LTAC for ongoing hiv/infectious disease treatment ? ? ? ?---------------- ?I am currently paging the hospitalist team at [facility] for direct admission ? ? ?His brother [name] and sister cell phones are good contact (I spoke with both on his sister's cell phone to let him know my plan). ? ?He has a Sports administrator benefit appointment to go to this morning (his brother taking him) but understands need to come to hospital today when a bed becomes available ? ?I will notify the inpatient ID team as well ? ? ?

## 2021-05-09 NOTE — Telephone Encounter (Signed)
I called and spoke to the patient and his sister regarding the retroperitoneal lymph node biopsy results. Findings revealed no evidence of malignancy but showed organisms consistent with Nocardia species. Patient will follow up with Dr. Gale Journey from infectious disease for further interventions. Patient and his sister expressed understanding of the plan provided.  ?

## 2021-05-09 NOTE — H&P (Addendum)
?  ?  ?[place] ? ? ?PATIENT NAME: Ricky [name]   ? ?MR#:  [id number] ? ?DATE OF BIRTH:  [birth year] ? ?DATE OF ADMISSION:  05/09/2021 ? ?PRIMARY CARE PHYSICIAN: Pcp, No  ? ?Patient is coming from: Home ? ?REQUESTING/REFERRING PHYSICIAN: Jabier Mutton, MD  ? ?CHIEF COMPLAINT:  ?Disseminated nocardia infection ? ?HISTORY OF PRESENT ILLNESS:  ?Ricky [name] is a 66 y.o. 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  The morphology is consistent with nocardia species. ? ?The patient has been having generalized fatigue and tiredness with no energy and no appetite.  He admitted to cough with occasional left upper chest wall pain with cough, wheezing and dyspnea.  He has been expectorating thick whitish sputum.  No fever or chills.  He admitted to nausea without vomiting or abdominal pain.  He has occasional dizziness and blurred vision.  No headache or focal muscle weakness.  He has been losing weight over the last 4 months.  He has bilateral feet numbness.  He admitted to ?ED Course: When he came here BP was 124/91 with otherwise normal vital signs.  Labs reveal hyponatremia 125 with hypochloremia 97 with alk phos of 184 and troponin 6.1 with albumin of less than 1.5 and total bili 1.8.  CBC showed hemoglobin of 7.1 hematocrit 21 slightly lower than previous levels with INR 1.4 and PT 16.8 and PTT 37.  COVID-19 PCR to back positive and influenza antigens were negative.  EKG as reviewed by me : Pending. ?Imaging: Pending. ? ?Is direct admitted to a medical telemetry bed for further evaluation and management. ?PAST MEDICAL HISTORY:   ? ?Past Medical History:  ?Diagnosis Date  ? 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 ?PAST SURGICAL HISTORY:  ? ?Past Surgical History:  ?Procedure Laterality Date  ? IR FL GUIDED LOC OF NEEDLE/CATH TIP FOR SPINAL INJECTION LT  10/17/2020  ? SVT ABLATION N/A 04/23/2020  ? Procedure: SVT ABLATION;  Surgeon: Evans Lance, MD;  Location: [facility];  Service: Cardiovascular;  Laterality: N/A;  ? ? ?SOCIAL HISTORY:  ? ?Social History  ? ?Tobacco Use  ? Smoking status: Former  ?  Types: Cigarettes  ? Smokeless tobacco: Former  ? Tobacco comments:  ?  Smokes 1 - 2 cigarttes a day 04/18/21  ?Substance Use Topics  ? Alcohol use: Yes  ?  Alcohol/week: 14.0 - 21.0 standard drinks  ?  Types: 14 - 21 Cans of beer per week  ? ? ?FAMILY HISTORY:  ? ?Family History  ?Problem Relation Age of Onset  ? Hypertension Mother   ? Kidney disease Mother   ? Hypertension Sister   ? Hypertension Brother   ? Kidney cancer Brother   ? Colon cancer Neg Hx   ? Liver cancer Neg Hx   ? Pancreatic cancer Neg Hx   ? Esophageal cancer Neg Hx   ? ? ?DRUG ALLERGIES:  ? ?Allergies  ?Allergen Reactions  ?  Bactrim [Sulfamethoxazole-Trimethoprim] Other (See Comments)  ?  Patient was trialed on DS TIW and SS daily for PJP prophylaxis and developed hyperkalemia and increased Scr  ? ? ?REVIEW OF SYSTEMS:  ? ?ROS ?As per history of present illness. All pertinent systems were reviewed above. Constitutional, HEENT, cardiovascular, respiratory, GI, GU, musculoskeletal, neuro, psychiatric, endocrine, integumentary and hematologic systems were reviewed and are otherwise negative/unremarkable except for  positive findings mentioned above in the HPI. ? ? ?MEDICATIONS AT HOME:  ? ?Prior to Admission medications   ?Medication Sig Start Date End Date Taking? Authorizing Provider  ?acetaminophen (TYLENOL) 500 MG tablet Take 500 mg by mouth every 6 (six) hours as needed for moderate pain or headache.    [provider]  ?azithromycin (ZITHROMAX) 500 MG tablet Take 1 tablet (500 mg total) by mouth daily. 04/04/21 03/30/22  Prudencio Pair T, MD  ?cephALEXin (KEFLEX) 500 MG capsule Take 500 mg by mouth every 6 (six) hours. 03/27/21   [provider]  ?dolutegravir (TIVICAY) 50 MG tablet Take 1 tablet (50 mg total) by mouth daily. 04/04/21   Vu, Johnny Bridge T, MD  ?emtricitabine-tenofovir (TRUVADA) 200-300 MG tablet Take 1 tablet by mouth daily. 04/04/21   Vu, Rockey Situ, MD  ?ethambutol (MYAMBUTOL) 400 MG tablet Take 2.5 tablets (1,000 mg total) by mouth daily. 04/04/21 03/30/22  Prudencio Pair T, MD  ?fluconazole (DIFLUCAN) 200 MG tablet Take 1 tablet (200 mg total) by mouth daily. 04/04/21 03/30/22  Vu, Rockey Situ, MD  ?fluticasone (FLONASE) 50 MCG/ACT nasal spray Place 1 spray into both nostrils as needed for allergies or rhinitis. ?Patient not taking: Reported on 01/23/2021 10/26/20 12/25/20  Donney Dice, DO  ?folic acid (FOLVITE) 1 MG tablet Take 1 tablet (1 mg total) by mouth daily. 01/25/21   Mercy Riding, MD  ?mirtazapine (REMERON) 15 MG tablet Take 1 tablet by mouth at bedtime. 04/16/21   Esmond Plants, RPH-CPP  ?ondansetron (ZOFRAN-ODT) 4 MG disintegrating tablet Take 1 tablet (4 mg total) by mouth every 8 (eight) hours as needed for nausea or vomiting. 03/23/21   Carlisle Cater, PA-C  ?oxyCODONE (OXY IR/ROXICODONE) 5 MG immediate release tablet Take 1-2 tablets (5-10 mg total) by mouth every 6 (six) hours as needed for severe pain. 04/05/21   Lincoln Brigham, PA-C  ?oxyCODONE-acetaminophen (PERCOCET) 10-325 MG tablet Take 1 tablet by mouth every 4 (four) hours as needed for pain.    [provider]  ?pantoprazole (PROTONIX)  20 MG tablet Take 1 tablet (20 mg total) by mouth daily. 03/23/21   Carlisle Cater, PA-C  ?rifabutin (MYCOBUTIN) 150 MG capsule Take 2 capsules (300 mg total) by mouth daily. 04/04/21 03/30/22  Jabier Mutton, MD  ?sulfamethoxazole-trimethoprim (BACTRIM) 400-80 MG tablet Take 1 tablet by mouth daily. 04/04/21 03/30/22  Prudencio Pair T, MD  ? ?  ? ?VITAL SIGNS:  ?Blood pressure (!) 124/91, pulse 100, temperature 97.6 ?F (36.4 ?C), temperature source Oral, resp. rate 20, height _0  (1.854 m), weight 63.6 kg, SpO2 100 %. ? ?PHYSICAL EXAMINATION:  ?Physical Exam ? ?GENERAL:  66 y.o.-year-old cachectic African-American male patient lying in the bed with no acute distress.  ?EYES: Pupils equal, round, reactive to light and accommodation. No scleral icterus. Extraocular muscles intact.  ?HEENT: Head atraumatic, normocephalic. Oropharynx and nasopharynx clear.  ?NECK:  Supple, no jugular venous distention. No thyroid enlargement, no tenderness.  ?LUNGS: Mild diminished bibasilar breath sounds, no wheezing, rales,rhonchi or crepitation. No use of accessory muscles of respiration.  ?CARDIOVASCULAR: Regular  rate and rhythm, S1, S2 normal. No murmurs, rubs, or gallops.  ?ABDOMEN: Soft, nondistended, nontender. Bowel sounds present. No organomegaly or mass.  ?EXTREMITIES: No pedal edema, cyanosis, or clubbing.  ?NEUROLOGIC: Cranial nerves II through XII are intact. Muscle strength 5/5 in all extremities. Sensation intact. Gait not checked.  ?PSYCHIATRIC: The patient is alert and oriented x 3.  Normal affect and good eye contact. ?SKIN: No obvious rash, lesion, or ulcer.  ? ?LABORATORY PANEL:  ? ?CBC ?Recent Labs  ?Lab 05/10/21 ?0251  ?WBC 4.2  ?HGB 7.1*  ?HCT 21.1*  ?PLT 141*  ? ?------------------------------------------------------------------------------------------------------------------ ? ?Chemistries  ?Recent Labs  ?Lab 05/09/21 ?2251 05/10/21 ?0251  ?NA 125* 127*  ?K 4.3 4.2  ?CL 97* 100  ?CO2 24 24  ?GLUCOSE 74 60*  ?BUN 15 16   ?CREATININE 1.17 1.07  ?CALCIUM 7.6* 7.5*  ?MG 1.8  --   ?AST 65* 59*  ?ALT 37 35  ?ALKPHOS 184* 168*  ?BILITOT 1.8* 1.7*  ? ?--------------------------------------------------------------------------------------

## 2021-05-10 ENCOUNTER — Other Ambulatory Visit: Payer: Self-pay

## 2021-05-10 ENCOUNTER — Other Ambulatory Visit (HOSPITAL_COMMUNITY): Payer: Self-pay

## 2021-05-10 ENCOUNTER — Encounter (HOSPITAL_COMMUNITY): Payer: Self-pay | Admitting: Family Medicine

## 2021-05-10 DIAGNOSIS — E871 Hypo-osmolality and hyponatremia: Secondary | ICD-10-CM

## 2021-05-10 DIAGNOSIS — U071 COVID-19: Secondary | ICD-10-CM | POA: Diagnosis not present

## 2021-05-10 DIAGNOSIS — B451 Cerebral cryptococcosis: Secondary | ICD-10-CM | POA: Diagnosis not present

## 2021-05-10 DIAGNOSIS — E43 Unspecified severe protein-calorie malnutrition: Secondary | ICD-10-CM | POA: Insufficient documentation

## 2021-05-10 DIAGNOSIS — A439 Nocardiosis, unspecified: Secondary | ICD-10-CM

## 2021-05-10 DIAGNOSIS — K219 Gastro-esophageal reflux disease without esophagitis: Secondary | ICD-10-CM

## 2021-05-10 DIAGNOSIS — B20 Human immunodeficiency virus [HIV] disease: Secondary | ICD-10-CM | POA: Diagnosis not present

## 2021-05-10 LAB — COMPREHENSIVE METABOLIC PANEL
ALT: 35 U/L (ref 0–44)
AST: 59 U/L — ABNORMAL HIGH (ref 15–41)
Albumin: <1.5 g/dL — ABNORMAL LOW (ref 3.5–5.0)
Alkaline Phosphatase: 168 U/L — ABNORMAL HIGH (ref 38–126)
Anion gap: 3 — ABNORMAL LOW (ref 5–15)
BUN: 16 mg/dL (ref 8–23)
CO2: 24 mmol/L (ref 22–32)
Calcium: 7.5 mg/dL — ABNORMAL LOW (ref 8.9–10.3)
Chloride: 100 mmol/L (ref 98–111)
Creatinine, Ser: 1.07 mg/dL (ref 0.61–1.24)
GFR, Estimated: >60 mL/min (ref >60)
Glucose, Bld: 60 mg/dL — ABNORMAL LOW (ref 70–99)
Potassium: 4.2 mmol/L (ref 3.5–5.1)
Sodium: 127 mmol/L — ABNORMAL LOW (ref 135–145)
Total Bilirubin: 1.7 mg/dL — ABNORMAL HIGH (ref 0.3–1.2)
Total Protein: 5.7 g/dL — ABNORMAL LOW (ref 6.5–8.1)

## 2021-05-10 LAB — CBC
HCT: 21.1 % — ABNORMAL LOW (ref 39.0–52.0)
Hemoglobin: 7.1 g/dL — ABNORMAL LOW (ref 13.0–17.0)
MCH: 33.5 pg (ref 26.0–34.0)
MCHC: 33.6 g/dL (ref 30.0–36.0)
MCV: 99.5 fL (ref 80.0–100.0)
Platelets: 141 10*3/uL — ABNORMAL LOW (ref 150–400)
RBC: 2.12 MIL/uL — ABNORMAL LOW (ref 4.22–5.81)
RDW: 14.3 % (ref 11.5–15.5)
WBC: 4.2 10*3/uL (ref 4.0–10.5)
nRBC: 0 % (ref 0.0–0.2)

## 2021-05-10 LAB — CBC WITH DIFFERENTIAL/PLATELET
Abs Immature Granulocytes: 0.58 10*3/uL — ABNORMAL HIGH (ref 0.00–0.07)
Absolute Monocytes: 179 cells/uL — ABNORMAL LOW (ref 200–950)
Basophils Absolute: 32 cells/uL (ref 0–200)
Basophils Absolute: 0 10*3/uL (ref 0.0–0.1)
Basophils Relative: 0.9 %
Basophils Relative: 0 %
Eosinophils Absolute: 0 cells/uL — ABNORMAL LOW (ref 15–500)
Eosinophils Absolute: 0 10*3/uL (ref 0.0–0.5)
Eosinophils Relative: 0 %
Eosinophils Relative: 0 %
HCT: 24.2 % — ABNORMAL LOW (ref 38.5–50.0)
HCT: 21 % — ABNORMAL LOW (ref 39.0–52.0)
Hemoglobin: 8.8 g/dL — ABNORMAL LOW (ref 13.2–17.1)
Hemoglobin: 7.1 g/dL — ABNORMAL LOW (ref 13.0–17.0)
Immature Granulocytes: 13 %
Lymphocytes Relative: 6 %
Lymphs Abs: 280 cells/uL — ABNORMAL LOW (ref 850–3900)
Lymphs Abs: 0.3 10*3/uL — ABNORMAL LOW (ref 0.7–4.0)
MCH: 34.1 pg — ABNORMAL HIGH (ref 27.0–33.0)
MCH: 33.8 pg (ref 26.0–34.0)
MCHC: 36.4 g/dL — ABNORMAL HIGH (ref 32.0–36.0)
MCHC: 33.8 g/dL (ref 30.0–36.0)
MCV: 93.8 fL (ref 80.0–100.0)
MCV: 100 fL (ref 80.0–100.0)
MPV: 9.7 fL (ref 7.5–12.5)
Monocytes Absolute: 0.3 10*3/uL (ref 0.1–1.0)
Monocytes Relative: 5.1 %
Monocytes Relative: 6 %
Neutro Abs: 3010 cells/uL (ref 1500–7800)
Neutro Abs: 3.3 10*3/uL (ref 1.7–7.7)
Neutrophils Relative %: 86 %
Neutrophils Relative %: 75 %
Platelets: 199 10*3/uL (ref 140–400)
Platelets: 164 10*3/uL (ref 150–400)
RBC: 2.58 10*6/uL — ABNORMAL LOW (ref 4.20–5.80)
RBC: 2.1 MIL/uL — ABNORMAL LOW (ref 4.22–5.81)
RDW: 12.6 % (ref 11.0–15.0)
RDW: 14.1 % (ref 11.5–15.5)
Smear Review: ADEQUATE
Total Lymphocyte: 8 %
WBC: 3.5 10*3/uL — ABNORMAL LOW (ref 3.8–10.8)
WBC: 4.5 10*3/uL (ref 4.0–10.5)
nRBC: 0 % (ref 0.0–0.2)

## 2021-05-10 LAB — COMPLETE METABOLIC PANEL WITH GFR
ALT: 40 U/L (ref 9–46)
AST: 63 U/L — ABNORMAL HIGH (ref 10–35)
Albumin: <1.5 g/dL — ABNORMAL LOW (ref 3.6–5.1)
Alkaline phosphatase (APISO): 233 U/L — ABNORMAL HIGH (ref 35–144)
BUN: 18 mg/dL (ref 7–25)
CO2: 25 mmol/L (ref 20–32)
Calcium: 7.8 mg/dL — ABNORMAL LOW (ref 8.6–10.3)
Chloride: 96 mmol/L — ABNORMAL LOW (ref 98–110)
Creat: 0.91 mg/dL (ref 0.70–1.35)
Glucose, Bld: 81 mg/dL (ref 65–99)
Potassium: 4.4 mmol/L (ref 3.5–5.3)
Sodium: 126 mmol/L — ABNORMAL LOW (ref 135–146)
Total Bilirubin: 1.4 mg/dL — ABNORMAL HIGH (ref 0.2–1.2)
Total Protein: 7 g/dL (ref 6.1–8.1)
eGFR: 95 mL/min/{1.73_m2} (ref >=60)

## 2021-05-10 LAB — HERPESVIRUS 8 (HHV-8) DNA, QUANTITATIVE REAL-TIME PCR
HERPESVIRUS 8 DNA, QN REAL TIME PCR: NOT DETECTED Log cps/mL
HERPESVIRUS 8 DNA, QN REAL TIME PCR: NOT DETECTED copies/mL

## 2021-05-10 LAB — FERRITIN
Ferritin: 1530 ng/mL — ABNORMAL HIGH (ref 24–380)
Ferritin: 1115 ng/mL — ABNORMAL HIGH (ref 24–336)

## 2021-05-10 LAB — CRYPTOCOCCAL AG, LTX SCR RFLX TITER
Cryptococcal Ag Screen: DETECTED — CR
Cryptococcal Ag Titer: 1:8 {titer} — AB
MICRO NUMBER:: 13224868
SPECIMEN QUALITY:: ADEQUATE

## 2021-05-10 LAB — TRIGLYCERIDES: Triglycerides: 81 mg/dL (ref <150)

## 2021-05-10 LAB — RESP PANEL BY RT-PCR (FLU A&B, COVID) ARPGX2
Influenza A by PCR: NEGATIVE
Influenza B by PCR: NEGATIVE
SARS Coronavirus 2 by RT PCR: POSITIVE — AB

## 2021-05-10 LAB — OSMOLALITY: Osmolality: 266 mOsm/kg — ABNORMAL LOW (ref 275–295)

## 2021-05-10 LAB — C-REACTIVE PROTEIN: CRP: 12.1 mg/dL — ABNORMAL HIGH (ref <1.0)

## 2021-05-10 LAB — OSMOLALITY, URINE: Osmolality, Ur: 459 mOsm/kg (ref 300–900)

## 2021-05-10 LAB — LACTATE DEHYDROGENASE
LDH: 266 U/L — ABNORMAL HIGH (ref 120–250)
LDH: 210 U/L — ABNORMAL HIGH (ref 98–192)

## 2021-05-10 LAB — SURGICAL PATHOLOGY

## 2021-05-10 LAB — FUNGITELL (1-3)-B-D-GLUCAN
(1-3)-B-D-Glucan: 57 pg/mL (ref <60)
Interpretation: NEGATIVE

## 2021-05-10 LAB — HIV-1 RNA QUANT-NO REFLEX-BLD
HIV 1 RNA Quant: 111 Copies/mL — ABNORMAL HIGH
HIV-1 RNA Quant, Log: 2.05 Log cps/mL — ABNORMAL HIGH

## 2021-05-10 LAB — SODIUM, URINE, RANDOM: Sodium, Ur: <10 mmol/L

## 2021-05-10 LAB — PROCALCITONIN: Procalcitonin: 1.62 ng/mL

## 2021-05-10 LAB — D-DIMER, QUANTITATIVE: D-Dimer, Quant: 2.87 ug/mL-FEU — ABNORMAL HIGH (ref 0.00–0.50)

## 2021-05-10 LAB — FIBRINOGEN: Fibrinogen: 212 mg/dL (ref 210–475)

## 2021-05-10 MED ORDER — ENSURE ENLIVE PO LIQD
237.0000 mL | Freq: Three times a day (TID) | ORAL | Status: DC
  Administered 2021-05-10 – 2021-05-21 (×30): 237 mL via ORAL

## 2021-05-10 MED ORDER — FAMOTIDINE 20 MG PO TABS
20.0000 mg | ORAL_TABLET | Freq: Two times a day (BID) | ORAL | Status: DC
  Administered 2021-05-10 – 2021-05-21 (×22): 20 mg via ORAL
  Filled 2021-05-10 (×24): qty 1

## 2021-05-10 MED ORDER — MOLNUPIRAVIR EUA 200MG CAPSULE
4.0000 | ORAL_CAPSULE | Freq: Two times a day (BID) | ORAL | Status: AC
  Administered 2021-05-10 – 2021-05-14 (×10): 800 mg via ORAL
  Filled 2021-05-10 (×2): qty 4

## 2021-05-10 MED ORDER — HYDROCOD POLI-CHLORPHE POLI ER 10-8 MG/5ML PO SUER
5.0000 mL | Freq: Two times a day (BID) | ORAL | Status: DC | PRN
  Administered 2021-05-14 – 2021-05-15 (×2): 5 mL via ORAL
  Filled 2021-05-10 (×3): qty 5

## 2021-05-10 MED ORDER — EMTRICITABINE-TENOFOVIR DF 200-300 MG PO TABS
1.0000 | ORAL_TABLET | Freq: Every day | ORAL | Status: DC
  Administered 2021-05-11 – 2021-05-21 (×11): 1 via ORAL
  Filled 2021-05-10 (×11): qty 1

## 2021-05-10 MED ORDER — ASCORBIC ACID 500 MG PO TABS
500.0000 mg | ORAL_TABLET | Freq: Every day | ORAL | Status: DC
  Administered 2021-05-10 – 2021-05-21 (×12): 500 mg via ORAL
  Filled 2021-05-10 (×13): qty 1

## 2021-05-10 MED ORDER — ZINC SULFATE 220 (50 ZN) MG PO CAPS
220.0000 mg | ORAL_CAPSULE | Freq: Every day | ORAL | Status: DC
  Administered 2021-05-10 – 2021-05-21 (×11): 220 mg via ORAL
  Filled 2021-05-10 (×12): qty 1

## 2021-05-10 MED ORDER — GUAIFENESIN-DM 100-10 MG/5ML PO SYRP
10.0000 mL | ORAL_SOLUTION | ORAL | Status: DC | PRN
  Administered 2021-05-14: 10 mL via ORAL
  Filled 2021-05-10: qty 10

## 2021-05-10 NOTE — Plan of Care (Signed)

## 2021-05-10 NOTE — Progress Notes (Signed)
Initial Nutrition Assessment ? ?DOCUMENTATION CODES:  ?Severe malnutrition in context of chronic illness ? ?INTERVENTION:  ?-liberalize to regular diet given severe malnutrition and poor po intake ?-continue Ensure Enlive po TID, each supplement provides 350 kcal and 20 grams of protein. ? ?NUTRITION DIAGNOSIS:  ?Severe Malnutrition related to chronic illness (HIV) as evidenced by severe muscle depletion, severe fat depletion, energy intake < 75% for > or equal to 1 month, percent weight loss. ? ?GOAL:  ?Patient will meet greater than or equal to 90% of their needs ? ?MONITOR:  ?PO intake, Supplement acceptance, Labs, Weight trends, I & O's ? ?REASON FOR ASSESSMENT:  ?Consult ?Assessment of nutrition requirement/status ? 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 ? ?Pt reports very poor appetite over the last several months with associated weight loss. States that he tries to eat 3 meals per day but portions are limited. Weight history reviewed. Pt with significant 17% weight loss since 03/23/21 (see below). ? ?PO Intake: none documented  ? ?UOP: 426m x24 hours ?I/O: +1180msince admit ? ?Medications: ? vitamin C  500 mg Oral Daily  ? azithromycin  500 mg Oral Daily  ? dolutegravir  50 mg Oral Daily  ? emtricitabine-tenofovir AF  1 tablet Oral Daily  ? enoxaparin (LOVENOX) injection  40 mg Subcutaneous Q24H  ? ethambutol  1,000 mg Oral Daily  ? famotidine  20 mg Oral BID  ? feeding supplement  237 mL Oral TID BM  ? folic acid  1 mg Oral Daily  ? mirtazapine  15 mg Oral QHS  ? molnupiravir EUA  4 capsule Oral BID  ? pantoprazole  20 mg Oral Daily  ? rifabutin  300 mg Oral Daily  ? sulfamethoxazole-trimethoprim  1 tablet Oral Daily  ? zinc sulfate  220 mg Oral Daily   ? ?Labs: ?Recent Labs  ?Lab 05/09/21 ?2251 05/10/21 ?0251  ?NA 125* 127*  ?K 4.3 4.2  ?CL 97* 100  ?CO2 24 24  ?BUN 15 16  ?CREATININE 1.17 1.07  ?CALCIUM 7.6* 7.5*  ?MG 1.8  --   ?GLUCOSE 74 60*  ?Hgb 7.1 ?  ?NUTRITION - FOCUSED PHYSICAL EXAM: ?Flowsheet Row Most Recent Value  ?Orbital Region Severe depletion  ?Upper Arm Region Severe depletion  ?Thoracic and Lumbar Region Severe depletion  ?Buccal Region Severe depletion  ?Temple Region Severe depletion  ?Clavicle Bone Region Severe depletion  ?Clavicle and Acromion Bone Region Severe depletion  ?Scapular Bone Region Severe depletion  ?Dorsal Hand Severe depletion  ?Patellar Region Severe depletion  ?Anterior Thigh Region Severe depletion  ?Posterior Calf Region Severe depletion  ?Edema (RD Assessment) None  ?Hair Reviewed  ?Eyes Reviewed  ?Mouth Reviewed  ?Skin Reviewed  ?Nails Reviewed  ? ?Diet Order:   ?Diet Order   ? ?       ?  Diet regular Room service appropriate? Yes; Fluid consistency: Thin  Diet effective now       ?  ? ?  ?  ? ?  ? ?EDUCATION NEEDS:  ?No education needs have been identified at this time ? ?Skin:  Skin Assessment: Skin Integrity Issues: ?Skin Integrity Issues:: Incisions ?Incisions: back ? ?Last BM:  PTA ? ?Height:  ?Ht Readings from Last 1 Encounters:  ?05/10/21 _0  (1.854 m)  ? ?Weight:  ?Wt Readings from Last 10 Encounters:  ?  05/10/21 63.6 kg  ?05/07/21 63.6 kg  ?05/01/21 63.5 kg  ?04/18/21 65.9 kg  ?04/05/21 66.7 kg  ?04/04/21 66.2 kg  ?04/03/21 68 kg  ?03/23/21 77.1 kg  ?01/21/21 77.1 kg  ?10/25/20 76.2 kg  ? ?BMI:  Body mass index is 18.5 kg/m?. ? ?Estimated Nutritional Needs:  ?Kcal:  2000-2200 ?Protein:  100-110 grams ?Fluid:  >2 ? ? ? ?Theone Stanley., MS, RD, LDN (she/her/hers) ?RD pager number and weekend/on-call pager number located in [employer]. ?

## 2021-05-10 NOTE — Assessment & Plan Note (Addendum)
-   Recurrence of infection is likely secondary to immunosuppression. ?- We will place him for now on p.o. molnupiravir. ?- Vitamin C, vitamin D3 and zinc sulfate will be provided. ?- Antitussive therapy and mucolytic therapy will be provided. ?- O2 protocol will be followed. ?

## 2021-05-10 NOTE — Consult Note (Signed)
?   ? ? ? ? ?[facility]   ? ?Date of Admission:  05/09/2021   Total days of inpatient antibiotics 0 ? ?      ?Reason for Consult: Concern for nocardia infection    ?Principal Problem: ?  Nocardia infection ?Active Problems: ?  Cryptococcal meningitis ([place]) ?  AIDS (acquired immune deficiency syndrome) ([place]) ?  COVID-19 ?  Hyponatremia ?  GERD without esophagitis ? ? ?Assessment: ?89 YM with Disseminated MAC(+ Blood Cx on 12/22 S macrolide) on three drug regimen, HIV/AIDS VL 111 on 05/07/21, Cd4 <35 on 03/26/21, seen by Heme-onc for possible lymphoproliferative disorder SP core Bx+ nocardia, direct admitted from ID clinic for management of nocardia. ? ?#Disseminated MAC ?-Spoke with Dr, Picklesimer, Pathology and requested case  reviewed as pt has history of MAC. Amendment made that organism is consistent with Nocardia to MAC.  ?Recommendations:  ?-Hold off on nocardia treatment ?-Consult general surgery for excisional Biopsy, obtain path, bacterial/afb and fungal Cx ?-Restart MAC therapy 3 drug regimen rifabutin, azithromycin and ethambutol(started 04/04/21) ?-MRI head ?-Blood and fungal Cx ? ?#HIV-AIDS ?-CD4 ?-Continue Truvada and Tivicay ?-bactrim for PJP PPX ? ?#Hx of Cryptococcal meningitis ?-Continue fluconazole for daily maintenance ? ? ?#Chronic COVID ?-Monitor for now ?-CT 20.4(44 cutoff). On malnupiravir x 6d ?-Pt has no respiratory symptoms ? ? 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 ?#Substance abuse ?-Reports he recently used drugs, denies recent IVDA. He did not wish to offer any further information.  ? ?Microbiology:   ?Antibiotics: ?MAC therapy ?HIV ?Truvada and Tivicay ?Bactrim due to PPX ? ? ? ?HPI: Ricky [name] is a 66 y.o. 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 Ct was done as pt had presented to ED which showed extensive lymphadenopathy. Seen by Hem-onc Dr. Lorenso Courier as there was concern for lymphoproliferative disorder and  pt sent for core biopsy. This returned as Nocardia. Seen at ID clinic by Dr. Gale Journey on 05/07/21 and pt direct admitted for further management. ?Today, pt reports he is adherent to his meds. He did miss a dose of his meds 2 weeks go. Denies fever, chills, N/V/D.  ? ?Review of Systems: ?Review of Systems  ?All other systems reviewed and are negative. ? ?Past Medical History:  ?Diagnosis Date  ? Human immunodeficiency virus (HIV) disease ([place]) 03/26/2020  ? ? ?Social History  ? ?Tobacco Use  ? Smoking status: Former  ?  Types: Cigarettes  ? Smokeless tobacco: Former  ? Tobacco comments:  ?  Smokes 1 - 2 cigarttes a day 04/18/21  ?Substance Use Topics  ? Alcohol use: Yes  ?  Alcohol/week: 14.0 - 21.0 standard drinks  ?  Types: 14 - 21 Cans of beer per week  ? Drug use: Not Currently  ? ? ?Family History  ?Problem Relation Age of Onset  ? Hypertension Mother   ? Kidney disease Mother   ? Hypertension Sister   ? Hypertension Brother   ? Kidney cancer Brother   ? Colon cancer Neg Hx   ? Liver cancer Neg Hx   ? Pancreatic cancer Neg Hx   ? Esophageal cancer Neg Hx   ? ?Scheduled Meds: ?  vitamin C  500 mg Oral Daily  ? azithromycin  500 mg Oral Daily  ? dolutegravir  50 mg Oral Daily  ? emtricitabine-tenofovir AF  1 tablet Oral Daily  ? enoxaparin (LOVENOX) injection  40 mg Subcutaneous Q24H  ? ethambutol  1,000 mg Oral Daily  ? famotidine  20 mg Oral BID  ? feeding supplement  237 mL Oral TID BM  ? folic acid  1 mg Oral Daily  ? mirtazapine  15 mg Oral QHS  ? molnupiravir EUA  4 capsule Oral BID  ? pantoprazole  20 mg Oral Daily  ? rifabutin  300 mg Oral Daily  ? sulfamethoxazole-trimethoprim  1 tablet Oral Daily  ?  zinc sulfate  220 mg Oral Daily  ? ?Continuous Infusions: ? sodium chloride 100 mL/hr at 05/10/21 1041  ? ?PRN Meds:.acetaminophen **OR** acetaminophen, chlorpheniramine-HYDROcodone, guaiFENesin-dextromethorphan, labetalol, magnesium hydroxide, ondansetron **OR** ondansetron (ZOFRAN) IV, oxyCODONE, traZODone ?Allergies  ?Allergen Reactions  ? Bactrim [Sulfamethoxazole-Trimethoprim] Other (See Comments)  ?  Patient was trialed on DS TIW and SS daily for PJP prophylaxis and developed hyperkalemia and increased Scr (pt is currently taking bactrim per infectious disease notes 05/07/21)  ? ? ?OBJECTIVE: ?Blood pressure 111/75, pulse (!) 102, temperature 97.6 ?F (36.4 ?C), temperature source Oral, resp. rate 20, height _0  (1.854 m), weight 63.6 kg, SpO2 96 %. ? ?Physical Exam ?Constitutional:   ?   General: He is not in acute distress. ?   Appearance: He is not toxic-appearing.  ?   Comments: frail  ?HENT:  ?   Head: Normocephalic and atraumatic.  ?   Right Ear: External ear normal.  ?   Left Ear: External ear normal.  ?   Nose: No congestion or rhinorrhea.  ?   Mouth/Throat:  ?   Mouth: Mucous membranes are moist.  ?   Pharynx: Oropharynx is clear.  ?Eyes:  ?   Extraocular Movements: Extraocular movements intact.  ?   Conjunctiva/sclera: Conjunctivae normal.  ?   Pupils: Pupils are equal, round, and reactive to light.  ?Cardiovascular:  ?   Rate and Rhythm: Normal rate and regular rhythm.  ?   Heart sounds: No murmur heard. ?  No friction rub. No gallop.  ?Pulmonary:  ?   Effort: Pulmonary effort is normal.  ?   Breath sounds: Normal breath sounds.  ?Abdominal:  ?   General: Abdomen is flat. Bowel sounds are normal.  ?   Palpations: Abdomen is soft.  ?Musculoskeletal:     ?   General: No swelling. Normal range of motion.  ?   Cervical back: Normal range of motion and neck supple.  ?Skin: ?   General: Skin is warm and dry.  ?Neurological:  ?   General: No focal deficit present.  ?   Mental Status: He is oriented to  person, place, and time.  ?Psychiatric:     ?   Mood and Affect: Mood normal.  ? ? ?Lab Results ?Lab Results  ?Component Value Date  ? WBC 4.2 05/10/2021  ? HGB 7.1 (L) 05/10/2021  ? HCT 21.1 (L) 05/10/2021  ? MCV 99.5 05/10/2021  ? PLT 141 (L) 05/10/2021  ?  ?Lab Results  ?Component Value Date  ? CREATININE 1.07 05/10/2021  ? BUN 16 05/10/2021  ? NA 127 (L) 05/10/2021  ? K 4.2 05/10/2021  ? CL 100 05/10/2021  ? CO2 24 05/10/2021  ?  ?Lab Results  ?Component Value Date  ? ALT 35 05/10/2021  ? AST  59 (H) 05/10/2021  ? ALKPHOS 168 (H) 05/10/2021  ? BILITOT 1.7 (H) 05/10/2021  ?  ? ? ? ?Laurice Record, MD ?[facility] Infect[facility] ?[facility] ?05/10/2021, 11:57 AM  ?

## 2021-05-10 NOTE — Assessment & Plan Note (Signed)
-   We will continue PPI at therapy. ?

## 2021-05-10 NOTE — Assessment & Plan Note (Signed)
-   The patient has been on oral Diflucan. ?- He may need IV amphotericin B or flucytosine as it has been apparently partially treated. ?

## 2021-05-10 NOTE — Assessment & Plan Note (Addendum)
-   The patient is directly admitted to a medical telemetry bed. ?- He will be placed on hydration with IV normal saline. ?- We will continue his Bactrim for now. ?- I discussed this case with Dr. Linus Salmons and ID will be seeing him in AM. ?- We will defer tailoring of IV antibiotic therapy as well as work-up to ID. ?- He will likely need IV Bactrim and imipenem and he has CNS disease IV amikacin. ?

## 2021-05-10 NOTE — Assessment & Plan Note (Signed)
-   We will continue antiretroviral therapy as well as prophylactic therapy. ?

## 2021-05-10 NOTE — Progress Notes (Signed)
? ? ? [facility] ?                                                                            ? ? ?Ricky [name], is a 66 y.o. male, DOB - 06/16/1955, BJY:[id number] ?Admit date - 05/09/2021    ?Outpatient Primary MD for the patient is Pcp, No ? ?LOS - 1  days ? ? ? ?Brief summary  ? 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  He had a recent lymph node biopsy that appears to be disseminated nocardia infection.  Patient was seen by ID, Dr. Gale Journey on 4/6 and was sent as a direct admit for work-up. ? ? ?Assessment & Plan  ? ? ?Assessment and Plan: ?*Disseminated Nocardia infection ?- Reviewed ID recommendations, ID consulted while inpatient. ?- Follow ID recommendations regarding antibiotic therapy ?- Reviewed Dr. Hart [place] recommendations, recommended brain MRI ?-Currently on Zithromax, Tivicay, Descovy, ethambutol, Bactrim, rifabutin.  ? ?COVID-19 ?-Has history of chronic/recurrent COVID likely due to immunosuppression ?-Currently placed on molnupiravir ?-Continue airborne precautions, O2 as needed ? ?Hyponatremia ?- Continue IV fluid hydration, sodium improving 127 today <--125 ?-  obtain serum osmolarity, urine osmolality, UNA ? ?AIDS (acquired immune deficiency syndrome) ([place]) ?- Continue antiretroviral therapy ? ?History of cryptococcal meningitis ([place]) ?- Follow ID recommendations ? ?GERD without esophagitis ?- Continue PPI ? ?Severe protein calorie malnutrition with hypoalbuminemia, underweight ?Estimated body mass index is 18.5 kg/m? as calculated from the following: ?  Height as of this encounter: _0  (1.854 m). ?  Weight as of this encounter: 63.6 kg. ?-Dietitian consult, placed on nutritional supplements ?-PT eval ? ?Code Status: Full code ?DVT Prophylaxis:  enoxaparin (LOVENOX)  injection 40 mg Start: 05/10/21 1800 ?Level of Care: Level of care: Telemetry Medical ?Family Communication: Updated patient ?Disposition Plan:     Remains inpatient appropriate: Work-up in progress ? ?Procedures:  ? ? ?Consultants:   ?Infectious disease ? ?Antimicrobials:  ? ?Anti-infectives (From admission, onward)  ? ? Start     Dose/Rate Route Frequency Ordered Stop  ? 05/10/21 1000  azithromycin (ZITHROMAX) tablet 500 mg       ? 500 mg Oral Daily 05/09/21 2237    ? 05/10/21 1000  dolutegravir (TIVICAY) tablet 50 mg       ? 50 mg Oral Daily 05/09/21 2237    ? 05/10/21 1000  emtricitabine-tenofovir AF (DESCOVY) 200-25 MG per tablet 1 tablet       ? 1 tablet Oral Daily 05/09/21 2237    ? 05/10/21 1000  ethambutol (MYAMBUTOL) tablet 1,000 mg       ? 1,000 mg Oral Daily 05/09/21 2237    ? 05/10/21 1000  sulfamethoxazole-trimethoprim (BACTRIM) 400-80 MG per tablet 1 tablet       ? 1 tablet Oral Daily 05/09/21 2237    ? 05/10/21 1000  rifabutin (MYCOBUTIN) capsule 300 mg       ? 300 mg Oral Daily 05/09/21 2237    ? 05/10/21 1000  molnupiravir EUA (LAGEVRIO) capsule 800 mg       ?  4 capsule Oral 2 times daily 05/10/21 0350 05/15/21 0959  ? ?  ? ? ? ?Medications ? ? vitamin C  500 mg Oral Daily  ? azithromycin  500 mg Oral Daily  ? dolutegravir  50 mg Oral Daily  ? emtricitabine-tenofovir AF  1 tablet Oral Daily  ? enoxaparin (LOVENOX) injection  40 mg Subcutaneous Q24H  ? ethambutol  1,000 mg Oral Daily  ? famotidine  20 mg Oral BID  ? folic acid  1 mg Oral Daily  ? mirtazapine  15 mg Oral QHS  ? molnupiravir EUA  4 capsule Oral BID  ? pantoprazole  20 mg Oral Daily  ? rifabutin  300 mg Oral Daily  ? sulfamethoxazole-trimethoprim  1 tablet Oral Daily  ? zinc sulfate  220 mg Oral Daily  ? ? ? ?Subjective:  ? ?Tayvian Holycross was seen and examined today.  Feels cold, tired.  No acute complaints.  No fevers.  Patient denies dizziness, chest pain, shortness of breath, abdominal pain, N/V/D/C. ? ?Objective:  ? ?Vitals:  ?  05/09/21 2153 05/10/21 0003 05/10/21 0826  ?BP: (!) 124/91 (!) 124/91 111/75  ?Pulse: 100 100 (!) 102  ?Resp: _0 ?Temp: 97.6 ?F (36.4 ?C) 97.6 ?F (36.4 ?C)   ?TempSrc: Oral Oral   ?SpO2: 100%  96%  ?Weight:  63.6 kg   ?Height:  _1  (1.854 m)   ? ? ?Intake/Output Summary (Last 24 hours) at 05/10/2021 1139 ?Last data filed at 05/10/2021 0831 ?Gross per 24 hour  ?Intake 517.01 ml  ?Output 400 ml  ?Net 117.01 ml  ? ?Filed Weights  ? 05/10/21 0003  ?Weight: 63.6 kg  ? ? ? ?Exam ?General: Alert and oriented x 3, NAD ?Cardiovascular: S1 S2 auscultated, no murmurs, RRR ?Respiratory: Diminished breath sound at the bases, no wheezing ?Gastrointestinal: Soft, nontender, nondistended, + bowel sounds ?Ext: no pedal edema bilaterally ?Neuro: moving all 4 extremities, no new FND's ?Psych: flat affect ? ? ?Data Reviewed:  I have personally reviewed following labs  ? ? ?CBC ?Lab Results  ?Component Value Date  ? WBC 4.2 05/10/2021  ? RBC 2.12 (L) 05/10/2021  ? HGB 7.1 (L) 05/10/2021  ? HCT 21.1 (L) 05/10/2021  ? MCV 99.5 05/10/2021  ? MCH 33.5 05/10/2021  ? PLT 141 (L) 05/10/2021  ? MCHC 33.6 05/10/2021  ? RDW 14.3 05/10/2021  ? LYMPHSABS 0.3 (L) 05/09/2021  ? MONOABS 0.3 05/09/2021  ? EOSABS 0.0 05/09/2021  ? BASOSABS 0.0 05/09/2021  ? ? ? ?Last metabolic panel ?Lab Results  ?Component Value Date  ? NA 127 (L) 05/10/2021  ? K 4.2 05/10/2021  ? CL 100 05/10/2021  ? CO2 24 05/10/2021  ? BUN 16 05/10/2021  ? CREATININE 1.07 05/10/2021  ? GLUCOSE 60 (L) 05/10/2021  ? GFRNONAA >60 05/10/2021  ? CALCIUM 7.5 (L) 05/10/2021  ? PHOS 2.1 (L) 01/24/2021  ? PROT 5.7 (L) 05/10/2021  ? ALBUMIN <1.5 (L) 05/10/2021  ? BILITOT 1.7 (H) 05/10/2021  ? ALKPHOS 168 (H) 05/10/2021  ? AST 59 (H) 05/10/2021  ? ALT 35 05/10/2021  ? ANIONGAP 3 (L) 05/10/2021  ? ? ? ?Coagulation Profile: ?Recent Labs  ?Lab 05/09/21 ?2251  ?INR 1.4*  ? ? ? ? ? ?Estill Cotta M.D. ?[facility] ?05/10/2021, 11:39 AM ? ?Available via Epic secure chat 7am-7pm ?After 7 pm,  please refer to night coverage provider listed on amion. ? ?  ?

## 2021-05-10 NOTE — Assessment & Plan Note (Signed)
-  He will be hydrated with IV normal saline and will follow his BMP. ?

## 2021-05-11 ENCOUNTER — Inpatient Hospital Stay (HOSPITAL_COMMUNITY): Payer: Medicaid Other

## 2021-05-11 DIAGNOSIS — A312 Disseminated mycobacterium avium-intracellulare complex (DMAC): Secondary | ICD-10-CM

## 2021-05-11 LAB — CBC WITH DIFFERENTIAL/PLATELET
Abs Immature Granulocytes: 0.47 10*3/uL — ABNORMAL HIGH (ref 0.00–0.07)
Basophils Absolute: 0 10*3/uL (ref 0.0–0.1)
Basophils Relative: 1 %
Eosinophils Absolute: 0 10*3/uL (ref 0.0–0.5)
Eosinophils Relative: 0 %
HCT: 21.7 % — ABNORMAL LOW (ref 39.0–52.0)
Hemoglobin: 7.3 g/dL — ABNORMAL LOW (ref 13.0–17.0)
Immature Granulocytes: 10 %
Lymphocytes Relative: 5 %
Lymphs Abs: 0.2 10*3/uL — ABNORMAL LOW (ref 0.7–4.0)
MCH: 34.1 pg — ABNORMAL HIGH (ref 26.0–34.0)
MCHC: 33.6 g/dL (ref 30.0–36.0)
MCV: 101.4 fL — ABNORMAL HIGH (ref 80.0–100.0)
Monocytes Absolute: 0.3 10*3/uL (ref 0.1–1.0)
Monocytes Relative: 6 %
Neutro Abs: 3.6 10*3/uL (ref 1.7–7.7)
Neutrophils Relative %: 78 %
Platelets: 122 10*3/uL — ABNORMAL LOW (ref 150–400)
RBC: 2.14 MIL/uL — ABNORMAL LOW (ref 4.22–5.81)
RDW: 14.7 % (ref 11.5–15.5)
WBC: 4.6 10*3/uL (ref 4.0–10.5)
nRBC: 0 % (ref 0.0–0.2)

## 2021-05-11 LAB — FERRITIN: Ferritin: 1048 ng/mL — ABNORMAL HIGH (ref 24–336)

## 2021-05-11 LAB — D-DIMER, QUANTITATIVE: D-Dimer, Quant: 2.83 ug/mL-FEU — ABNORMAL HIGH (ref 0.00–0.50)

## 2021-05-11 LAB — C-REACTIVE PROTEIN: CRP: 11.5 mg/dL — ABNORMAL HIGH (ref <1.0)

## 2021-05-11 IMAGING — MR MR HEAD WO/W CM
12 of 14 series · 39 of 48 positions shown · IV contrast (Gadavist)
Comparison: [DATE]

CLINICAL DATA: Disseminated Nocardia infection.  History of AIDS.

EXAM:
MRI HEAD WITHOUT AND WITH CONTRAST
TECHNIQUE: Multiplanar, multiecho pulse sequences of the brain and surrounding
structures were obtained without and with intravenous contrast.
CONTRAST:  6.5mL GADAVIST GADOBUTROL 1 MMOL/ML IV SOLN

[Series 5: DWI · axial · 3.0mm · 0.88mm/px · z∈[-57,+84]mm · 8 of 96 slices shown (1 of 4)]
[im 1/96]
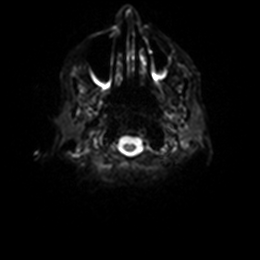
[im 14/96]
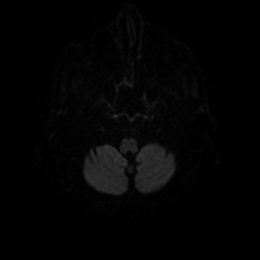
[im 28/96]
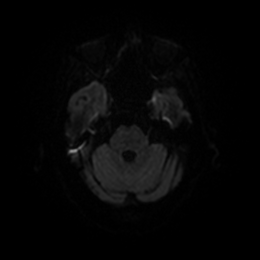
[im 41/96]
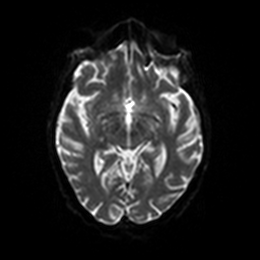
[im 55/96]
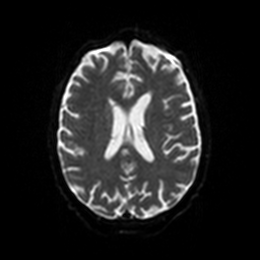
[im 68/96]
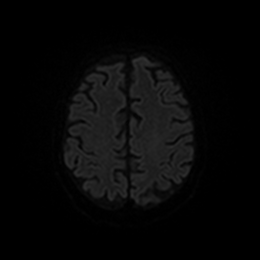
[im 82/96]
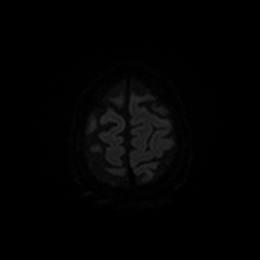
[im 96/96]
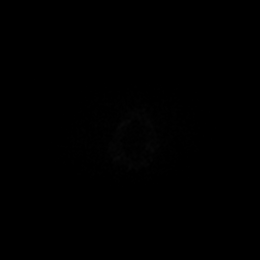

[Series 6: DWI · axial · 3.0mm · 0.88mm/px · z∈[-57,+84]mm · 3 of 44 slices shown (2 of 4)]
[im 1/44]
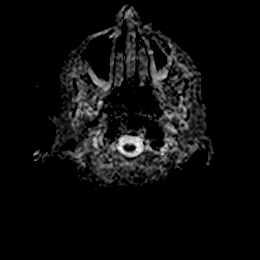
[im 22/44]
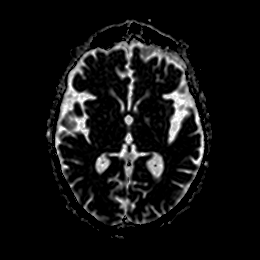
[im 44/44]
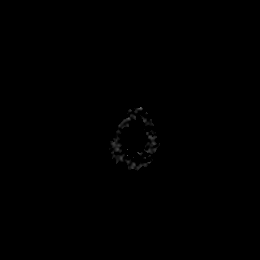

[Series 7: DWI · coronal · 4.0mm · 0.88mm/px · 5 of 64 slices shown (3 of 4)]
[im 1/64]
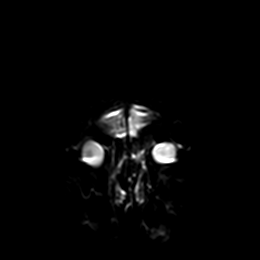
[im 16/64]
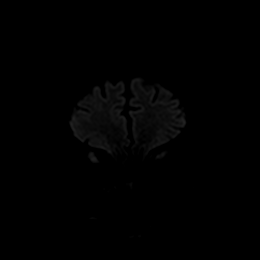
[im 32/64]
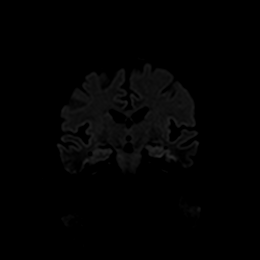
[im 48/64]
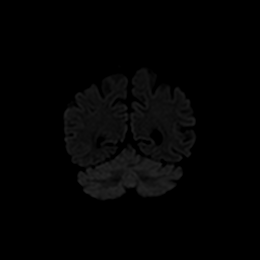
[im 64/64]
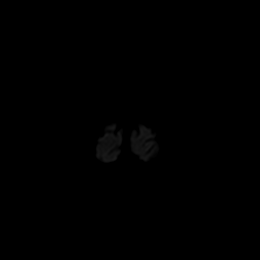

[Series 8: DWI · coronal · 4.0mm · 0.88mm/px · 2 of 32 slices shown (4 of 4)]
[im 1/32]
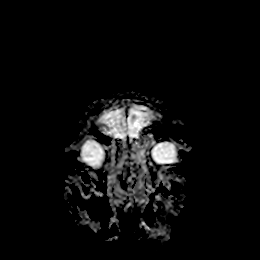
[im 32/32]
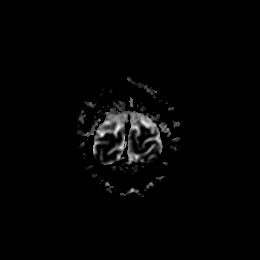

[Series 9: T1 · sagittal · 5.0mm · 0.75mm/px · 2 of 23 slices shown]
[im 1/23]
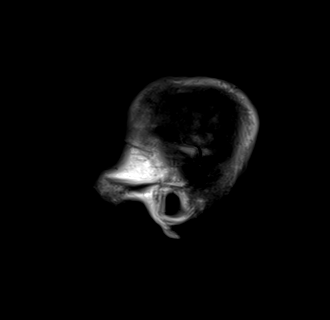
[im 23/23]
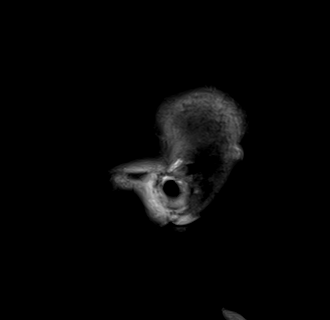

[Series 10: T2 · axial · 5.0mm · 0.72mm/px · z∈[-59,+85]mm · 2 of 25 slices shown]
[im 1/25]
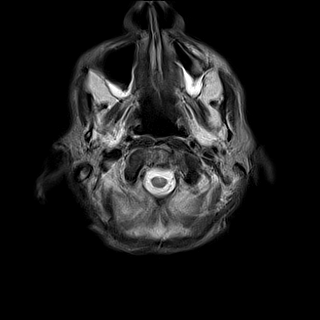
[im 25/25]
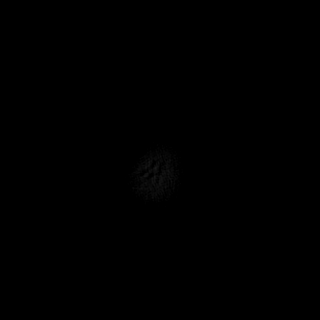

[Series 11: FLAIR · axial · 5.0mm · 0.45mm/px · z∈[-58,+86]mm · 2 of 25 slices shown]
[im 1/25]
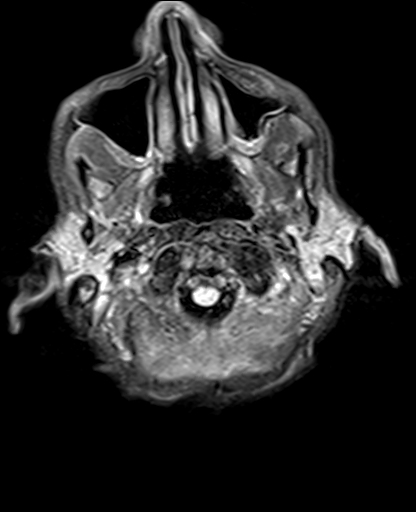
[im 25/25]
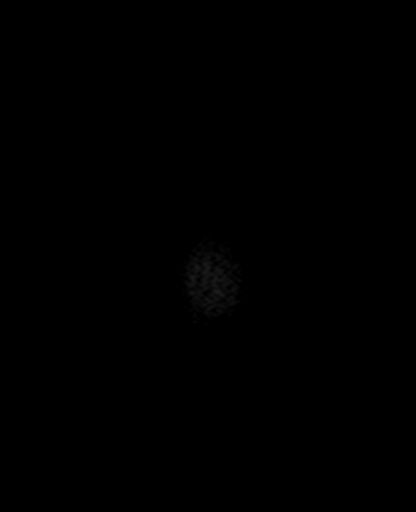

[Series 13: pha_images · axial · 3.0mm · 0.90mm/px · z∈[-74,+103]mm · 4 of 54 slices shown]
[im 1/54]
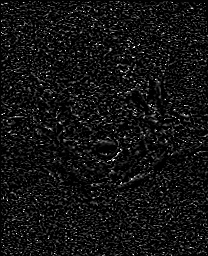
[im 18/54]
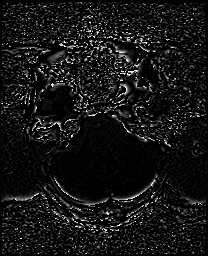
[im 36/54]
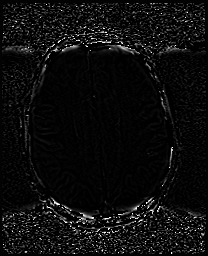
[im 54/54]
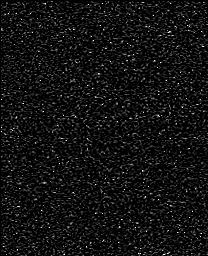

[Series 14: swi_images · axial · 3.0mm · 0.90mm/px · z∈[-74,+103]mm · 5 of 60 slices shown]
[im 1/60]
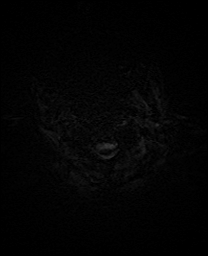
[im 15/60]
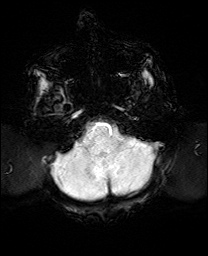
[im 30/60]
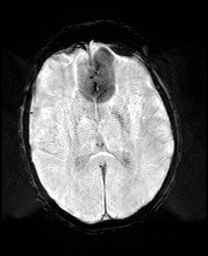
[im 45/60]
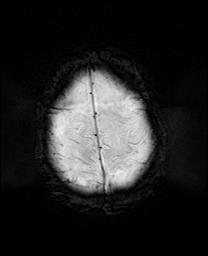
[im 60/60]
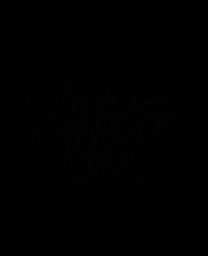

[Series 17: T2 post-contrast · coronal · 5.0mm · 0.72mm/px · 2 of 28 slices shown]
[im 1/28]
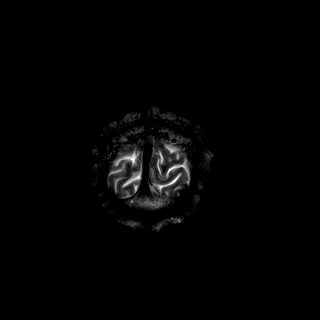
[im 28/28]
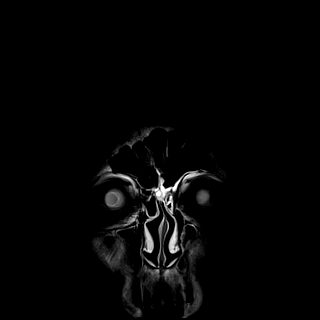

[Series 25: T1 post-contrast · sagittal · 5.0mm · 0.75mm/px · 2 of 23 slices shown (1 of 2)]
[im 1/23]
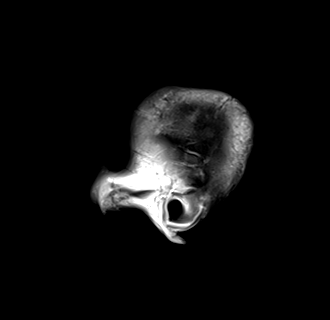
[im 23/23]
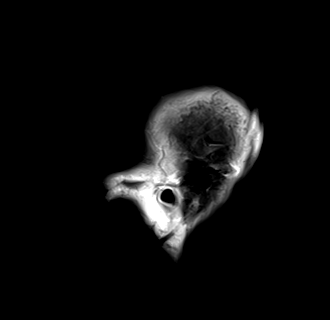

[Series 26: T1 post-contrast · coronal · 5.0mm · 0.34mm/px · 2 of 28 slices shown (2 of 2)]
[im 1/28]
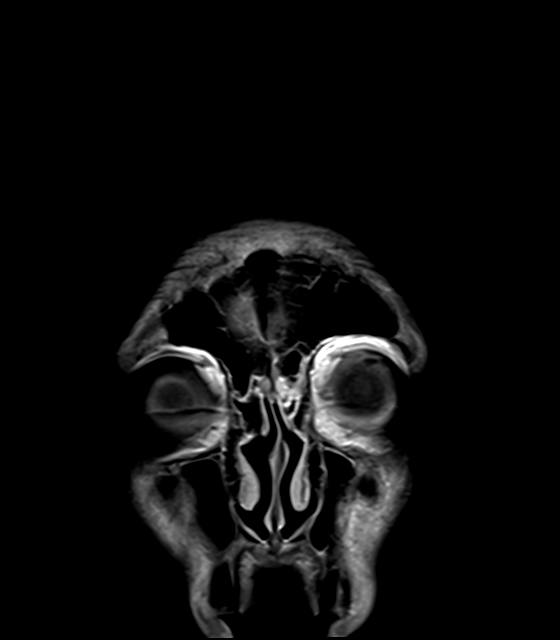
[im 28/28]
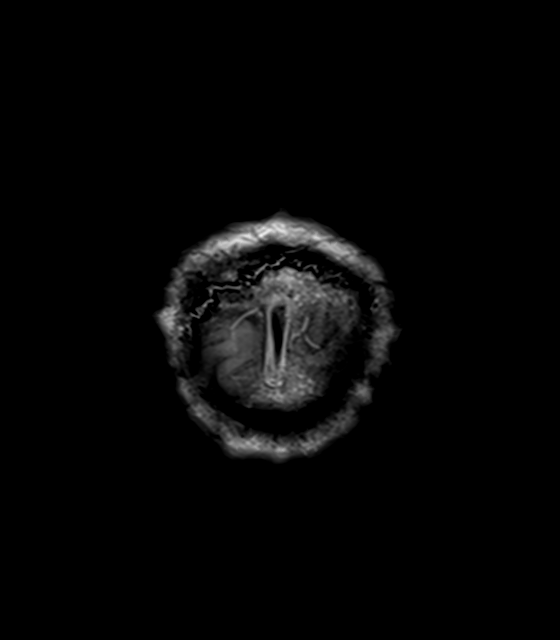

[39 of 48 positions shown; findings below may reference images not displayed]

FINDINGS: Brain: No acute infarction, hemorrhage, hydrocephalus, extra-axial
collection or mass lesion. Chronic subdural collections along the
bifrontal convexity with accentuated dural enhancement. No
associated mass effect. Generalized cerebral volume loss probable
progression.

Vascular: Major flow voids and vascular enhancements are preserved

Skull and upper cervical spine: Diffusely hypointense marrow in the
cervical spine. No focal lesion or inflammation seen.

Sinuses/Orbits: Mild mucosal thickening symmetrically in paranasal
sinuses with bilateral maxillary fluid level. Negative orbits.
IMPRESSION: 1. No acute intracranial finding.  No evidence of CNS infection.
2. Brain atrophy.
3. Chronic trace subdural collections without mass effect.
4. Bilateral maxillary sinus fluid levels.

## 2021-05-11 MED ORDER — GADOBUTROL 1 MMOL/ML IV SOLN
6.5000 mL | Freq: Once | INTRAVENOUS | Status: AC | PRN
  Administered 2021-05-11: 6.5 mL via INTRAVENOUS

## 2021-05-11 MED ORDER — FLUCONAZOLE 100 MG PO TABS
200.0000 mg | ORAL_TABLET | Freq: Every day | ORAL | Status: DC
  Administered 2021-05-11 – 2021-05-20 (×10): 200 mg via ORAL
  Filled 2021-05-11 (×12): qty 2

## 2021-05-11 NOTE — Progress Notes (Signed)
Called for excisional biopsy. This biopsy will not be done over the weekend. ?

## 2021-05-11 NOTE — Progress Notes (Signed)
?  [facility] ? ? ?Reason for visit: Follow up on disseminated MAC ? ?Interval History: Pathology specimen has been reviewed by 2 additional pathologists and concur that is consistent with MAC.   ?Remains on isolation for COVID-19 ? ? ?Physical Exam: ?Constitutional:  ?Vitals:  ? 05/11/21 0620 05/11/21 1000  ?BP: 121/85 123/80  ?Pulse: 100 90  ?Resp: 18   ?Temp: 97.9 ?F (36.6 ?C) 98.6 ?F (37 ?C)  ?SpO2: 98% 99%  ? patient appears in NAD ?Respiratory: Normal respiratory effort; CTA B ? ?Review of Systems: ?Constitutional: negative for fevers ? ?Lab Results  ?Component Value Date  ? WBC 4.6 05/11/2021  ? HGB 7.3 (L) 05/11/2021  ? HCT 21.7 (L) 05/11/2021  ? MCV 101.4 (H) 05/11/2021  ? PLT 122 (L) 05/11/2021  ?  ?Lab Results  ?Component Value Date  ? CREATININE 1.07 05/10/2021  ? BUN 16 05/10/2021  ? NA 127 (L) 05/10/2021  ? K 4.2 05/10/2021  ? CL 100 05/10/2021  ? CO2 24 05/10/2021  ?  ?Lab Results  ?Component Value Date  ? ALT 35 05/10/2021  ? AST 59 (H) 05/10/2021  ? ALKPHOS 168 (H) 05/10/2021  ?  ? ?Microbiology: ?Recent Results (from the past 240 hour(s))  ?Resp Panel by RT-PCR (Flu A&B, Covid) Nasopharyngeal Swab     Status: Abnormal  ? Collection Time: 05/09/21 11:24 PM  ? Specimen: Nasopharyngeal Swab; Nasopharyngeal(NP) swabs in vial transport medium  ?Result Value Ref Range Status  ? SARS Coronavirus 2 by RT PCR POSITIVE (A) NEGATIVE Final  ?  Comment: (NOTE) ?SARS-CoV-2 target nucleic acids are DETECTED. ? ?The SARS-CoV-2 RNA is generally detectable in upper respiratory ?specimens during the acute phase of infection. Positive results are ?indicative of the presence of the identified virus, but do not rule ?out bacterial infection or co-infection with other pathogens not ?detected by the test. Clinical correlation with patient history and ?other diagnostic information is necessary to determine patient ?infection status. The expected result is Negative. ? ?Fact Sheet for  Patients: ?EntrepreneurPulse.com.au ? ?Fact Sheet for Healthcare Providers: ?IncredibleEmployment.be ? ?This test is not yet approved or cleared by the [place] FDA and  ?has been authorized for detection and/or diagnosis of SARS-CoV-2 by ?FDA under an Emergency Use Authorization (EUA).  This EUA will ?remain in effect (meaning this test can be used) for the duration of  ?the COVID-19 declaration under Section 564(b)(1) of the A ct, 21 ?U.S.C. section 360bbb-3(b)(1), unless the authorization is ?terminated or revoked sooner. ? ?  ? Influenza A by PCR NEGATIVE NEGATIVE Final  ? Influenza B by PCR NEGATIVE NEGATIVE Final  ?  Comment: (NOTE) ?The Xpert Xpress SARS-CoV-2/FLU/RSV plus assay is intended as an aid ?in the diagnosis of influenza from Nasopharyngeal swab specimens and ?should not be used as a sole basis for treatment. Nasal washings and ?aspirates are unacceptable for Xpert Xpress SARS-CoV-2/FLU/RSV ?testing. ? ?Fact Sheet for Patients: ?EntrepreneurPulse.com.au ? ?Fact Sheet for Healthcare Providers: ?IncredibleEmployment.be ? ?This test is not yet approved or cleared by the [place] FDA and ?has been authorized for detection and/or diagnosis of SARS-CoV-2 by ?FDA under an Emergency Use Authorization (EUA). This EUA will remain ?in effect (meaning this test can be used) for the duration of the ?COVID-19 declaration under Section 564(b)(1) of the Act, 21 U.S.C. ?section 360bbb-3(b)(1), unless the authorization is terminated or ?revoked. ? ?Performed at [facility], [place] [address], [place], [place] ?[address] ?  ?Culture, blood (routine x  2)     Status: None (Preliminary result)  ? Collection Time: 05/10/21 10:31 AM  ? Specimen: BLOOD  ?Result Value Ref Range Status  ? Specimen Description BLOOD RIGHT ANTECUBITAL  Final  ? Special Requests AEROBIC BOTTLE ONLY Blood Culture adequate volume  Final  ? Culture   Final  ?  NO  GROWTH 1 DAY ?Performed at [facility], [place] [address] [place], [place] [address] ?  ? Report Status PENDING  Incomplete  ?Culture, blood (routine x 2)     Status: None (Preliminary result)  ? Collection Time: 05/10/21 10:38 AM  ? Specimen: BLOOD  ?Result Value Ref Range Status  ? Specimen Description BLOOD LEFT ANTECUBITAL  Final  ? Special Requests   Final  ?  BOTTLES DRAWN AEROBIC AND ANAEROBIC Blood Culture results may not be optimal due to an inadequate volume of blood received in culture bottles  ? Culture   Final  ?  NO GROWTH 1 DAY ?Performed at [facility], [place] [address] [place], [place] [address] ?  ? Report Status PENDING  Incomplete  ? ? ?Impression/Plan:  ?1. 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   ?He will need to continue with his current 3-drug regimen of azithromycin, rifabutin and ethambutol.   ? ?2.  Adenopathy - in chest, abdomen and marked hypermetabolic features in the spleen and concern for lymphoproliferative disorder.  Though he also has disseminated MAC which certainly is associated with adenopathy, these findings also could represent lymphoma in this person with advanced, poorly controlled HIV and for that reason an excisional biopsy is indicated - can send for pathology, AFB, fungal stains and cultures and bacterial cultures.   ? ?3.  HIV/AIDS - Last CD4 in December < 35 and has been back on his medications with Tivicay and Trudada.  Will recheck his CD4 count.  Viral load 111, down from 105,000.   ? ?4.  Opportunistic infection risk - on Bactrim for PJP risk and fluconazole for secondary prophylaxis.   ? ?Once exicisional biopsy done, ok for discharge and we will follow up the results as an outpatient.   ?Appointment scheduled with our clinic on 4/18. ? ?  ?

## 2021-05-11 NOTE — Plan of Care (Signed)

## 2021-05-11 NOTE — Progress Notes (Addendum)
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   New events last 24 hours / Subjective: Restarted medication for MAC treatment.  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 Sheliah Hatch today who agreed to see the pt  Active Problems:   Cryptococcal meningitis (HCC)  Fluconazole 20 mg daily    AIDS (acquired immune deficiency syndrome) (HCC)  Truvada 200-300 mg daily  Tivicay 50 mg daily  Bactrim 400-80 mg daily Alexis    Nocardia infection  Holding on treatment per ID    Hyponatremia  Monitor BMP  Covid 19  Molnupiravir    GERD without esophagitis  Pepcid 20 mg twice daily  Protonix 40 mg daily    Protein-calorie malnutrition, severe  Ensure  Nutrition Problem: Severe Malnutrition Etiology: chronic illness (HIV)   DVT prophylaxis: Lovenox Code Status: Full Family Communication: Self, spoke on phone w brother Rayna Sexton Coming From: Home Disposition Plan: Pending Barriers to Discharge: Clinical improvement  Consultants:  ID General Surgery  Procedures:  None  Antimicrobials:  Anti-infectives (From admission, onward)    Start     Dose/Rate Route Frequency Ordered Stop   05/11/21 1400  fluconazole (DIFLUCAN) tablet 200 mg        200 mg  Oral Daily 05/11/21 1303     05/11/21 1000  emtricitabine-tenofovir (TRUVADA) 200-300 MG per tablet 1 tablet        1 tablet Oral Daily 05/10/21 1320     05/10/21 1000  azithromycin (ZITHROMAX) tablet 500 mg        500 mg Oral Daily 05/09/21 2237     05/10/21 1000  dolutegravir (TIVICAY) tablet 50 mg        50 mg Oral Daily 05/09/21 2237     05/10/21 1000  emtricitabine-tenofovir AF (DESCOVY) 200-25 MG per tablet 1 tablet  Status:  Discontinued        1 tablet Oral Daily 05/09/21 2237 05/10/21 1320   05/10/21 1000  ethambutol (MYAMBUTOL) tablet 1,000 mg        1,000 mg Oral Daily 05/09/21 2237     05/10/21 1000  sulfamethoxazole-trimethoprim (BACTRIM) 400-80 MG per tablet 1 tablet        1 tablet Oral Daily 05/09/21 2237     05/10/21 1000  rifabutin (MYCOBUTIN) capsule 300 mg        300 mg Oral Daily 05/09/21 2237     05/10/21 1000  molnupiravir EUA (LAGEVRIO) capsule 800 mg        4 capsule Oral 2 times daily 05/10/21 0350 05/15/21 0959        Objective: Vitals:   05/10/21 1756 05/10/21 2231 05/11/21 0620 05/11/21 1000  BP: 103/67 104/65 121/85 123/80  Pulse: (!) 101 90 100 90  Resp: 18 17 18    Temp: 98.2 F (36.8 C) 98 F (36.7  C) 97.9 F (36.6 C) 98.6 F (37 C)  TempSrc: Oral Oral Oral Oral  SpO2: 98% 99% 98% 99%  Weight:      Height:        Intake/Output Summary (Last 24 hours) at 05/11/2021 1412 Last data filed at 05/10/2021 2231 Gross per 24 hour  Intake 494.2 ml  Output 250 ml  Net 244.2 ml   Filed Weights   05/10/21 0003  Weight: 63.6 kg    Examination:  General exam: Appears calm Respiratory system: Clear to auscultation. Respiratory effort normal.  Cardiovascular system: S1 & S2 heard, RRR. Gastrointestinal system: Abdomen is nondistended, soft and nontender. Normal bowel sounds heard. Central nervous system: Alert. No focal neurological deficits. Speech mumbled.  Extremities: Symmetric in appearance  Skin: No rashes, lesions or ulcers on exposed skin   Psychiatry: Mood & affect appropriate.   Data Reviewed: I have personally reviewed following labs and imaging studies  CBC: Recent Labs  Lab 05/09/21 2251 05/10/21 0251 05/11/21 0333  WBC 4.5 4.2 4.6  NEUTROABS 3.3  --  3.6  HGB 7.1* 7.1* 7.3*  HCT 21.0* 21.1* 21.7*  MCV 100.0 99.5 101.4*  PLT 164 141* 122*   Basic Metabolic Panel: Recent Labs  Lab 05/09/21 2251 05/10/21 0251  NA 125* 127*  K 4.3 4.2  CL 97* 100  CO2 24 24  GLUCOSE 74 60*  BUN 15 16  CREATININE 1.17 1.07  CALCIUM 7.6* 7.5*  MG 1.8  --    GFR: Estimated Creatinine Clearance: 63.6 mL/min (by C-G formula based on SCr of 1.07 mg/dL).  Liver Function Tests: Recent Labs  Lab 05/09/21 2251 05/10/21 0251  AST 65* 59*  ALT 37 35  ALKPHOS 184* 168*  BILITOT 1.8* 1.7*  PROT 6.1* 5.7*  ALBUMIN <1.5* <1.5*   Coagulation Profile: Recent Labs  Lab 05/09/21 2251  INR 1.4*   Anemia Panel: Recent Labs    05/10/21 0727 05/11/21 0333  FERRITIN 1,115* 1,048*   Sepsis Labs: Recent Labs  Lab 05/10/21 0727  PROCALCITON 1.62    Recent Results (from the past 240 hour(s))  Resp Panel by RT-PCR (Flu A&B, Covid) Nasopharyngeal Swab     Status: Abnormal   Collection Time: 05/09/21 11:24 PM   Specimen: Nasopharyngeal Swab; Nasopharyngeal(NP) swabs in vial transport medium  Result Value Ref Range Status   SARS Coronavirus 2 by RT PCR POSITIVE (A) NEGATIVE Final    Comment: (NOTE) SARS-CoV-2 target nucleic acids are DETECTED.  The SARS-CoV-2 RNA is generally detectable in upper respiratory specimens during the acute phase of infection. Positive results are indicative of the presence of the identified virus, but do not rule out bacterial infection or co-infection with other pathogens not detected by the test. Clinical correlation with patient history and other diagnostic information is necessary to determine patient infection status. The expected result is Negative.  Fact Sheet for  Patients: BloggerCourse.com  Fact Sheet for Healthcare Providers: SeriousBroker.it  This test is not yet approved or cleared by the [place] FDA and  has been authorized for detection and/or diagnosis of SARS-CoV-2 by FDA under an Emergency Use Authorization (EUA).  This EUA will remain in effect (meaning this test can be used) for the duration of  the COVID-19 declaration under Section 564(b)(1) of the A ct, 21 U.S.C. section 360bbb-3(b)(1), unless the authorization is terminated or revoked sooner.     Influenza A by PCR NEGATIVE NEGATIVE Final   Influenza B by PCR NEGATIVE NEGATIVE Final  Comment: (NOTE) The Xpert Xpress SARS-CoV-2/FLU/RSV plus assay is intended as an aid in the diagnosis of influenza from Nasopharyngeal swab specimens and should not be used as a sole basis for treatment. Nasal washings and aspirates are unacceptable for Xpert Xpress SARS-CoV-2/FLU/RSV testing.  Fact Sheet for Patients: BloggerCourse.com  Fact Sheet for Healthcare Providers: SeriousBroker.it  This test is not yet approved or cleared by the [place] FDA and has been authorized for detection and/or diagnosis of SARS-CoV-2 by FDA under an Emergency Use Authorization (EUA). This EUA will remain in effect (meaning this test can be used) for the duration of the COVID-19 declaration under Section 564(b)(1) of the Act, 21 U.S.C. section 360bbb-3(b)(1), unless the authorization is terminated or revoked.  Performed at [facility], [address] [place], [place] [address]   Culture, blood (routine x 2)     Status: None (Preliminary result)   Collection Time: 05/10/21 10:31 AM   Specimen: BLOOD  Result Value Ref Range Status   Specimen Description BLOOD RIGHT ANTECUBITAL  Final   Special Requests AEROBIC BOTTLE ONLY Blood Culture adequate volume  Final   Culture   Final    NO  GROWTH 1 DAY Performed at [facility], [address], [place], [place] [address]    Report Status PENDING  Incomplete  Culture, blood (routine x 2)     Status: None (Preliminary result)   Collection Time: 05/10/21 10:38 AM   Specimen: BLOOD  Result Value Ref Range Status   Specimen Description BLOOD LEFT ANTECUBITAL  Final   Special Requests   Final    BOTTLES DRAWN AEROBIC AND ANAEROBIC Blood Culture results may not be optimal due to an inadequate volume of blood received in culture bottles   Culture   Final    NO GROWTH 1 DAY Performed at [facility], [address], [place], [place] [address]    Report Status PENDING  Incomplete      Radiology Studies: MR BRAIN W WO CONTRAST  Result Date: 05/11/2021 CLINICAL DATA:  Disseminated Nocardia infection.  History of AIDS. EXAM: MRI HEAD WITHOUT AND WITH CONTRAST TECHNIQUE: Multiplanar, multiecho pulse sequences of the brain and surrounding structures were obtained without and with intravenous contrast. CONTRAST:  6.81mL GADAVIST GADOBUTROL 1 MMOL/ML IV SOLN COMPARISON:  10/13/2020 FINDINGS: Brain: No acute infarction, hemorrhage, hydrocephalus, extra-axial collection or mass lesion. Chronic subdural collections along the bifrontal convexity with accentuated dural enhancement. No associated mass effect. Generalized cerebral volume loss probable progression. Vascular: Major flow voids and vascular enhancements are preserved Skull and upper cervical spine: Diffusely hypointense marrow in the cervical spine. No focal lesion or inflammation seen. Sinuses/Orbits: Mild mucosal thickening symmetrically in paranasal sinuses with bilateral maxillary fluid level. Negative orbits. IMPRESSION: 1. No acute intracranial finding.  No evidence of CNS infection. 2. Brain atrophy. 3. Chronic trace subdural collections without mass effect. 4. Bilateral maxillary sinus fluid levels. Electronically Signed   By: Tiburcio Pea M.D.   On: 05/11/2021 06:14      Scheduled Meds:  vitamin C  500 mg Oral Daily   azithromycin  500 mg Oral Daily   dolutegravir  50 mg Oral Daily   emtricitabine-tenofovir  1 tablet Oral Daily   enoxaparin (LOVENOX) injection  40 mg Subcutaneous Q24H   ethambutol  1,000 mg Oral Daily   famotidine  20 mg Oral BID   feeding supplement  237 mL Oral TID BM   fluconazole  200 mg Oral Daily  folic acid  1 mg Oral Daily   mirtazapine  15 mg Oral QHS   molnupiravir EUA  4 capsule Oral BID   pantoprazole  20 mg Oral Daily   rifabutin  300 mg Oral Daily   sulfamethoxazole-trimethoprim  1 tablet Oral Daily   zinc sulfate  220 mg Oral Daily   Continuous Infusions:  sodium chloride 100 mL/hr at 05/10/21 1041     LOS: 2 days   Time spent: 35 minutes   Sharlene Dory, DO [facility] 05/11/2021, 2:12 PM   Available via Epic secure chat 7am-7pm After these hours, please refer to coverage provider listed on [email or web address]

## 2021-05-12 DIAGNOSIS — A312 Disseminated mycobacterium avium-intracellulare complex (DMAC): Secondary | ICD-10-CM | POA: Diagnosis not present

## 2021-05-12 LAB — COMPREHENSIVE METABOLIC PANEL
ALT: 30 U/L (ref 0–44)
AST: 55 U/L — ABNORMAL HIGH (ref 15–41)
Albumin: <1.5 g/dL — ABNORMAL LOW (ref 3.5–5.0)
Alkaline Phosphatase: 138 U/L — ABNORMAL HIGH (ref 38–126)
Anion gap: 4 — ABNORMAL LOW (ref 5–15)
BUN: 11 mg/dL (ref 8–23)
CO2: 23 mmol/L (ref 22–32)
Calcium: 7.2 mg/dL — ABNORMAL LOW (ref 8.9–10.3)
Chloride: 103 mmol/L (ref 98–111)
Creatinine, Ser: 0.98 mg/dL (ref 0.61–1.24)
GFR, Estimated: >60 mL/min (ref >60)
Glucose, Bld: 72 mg/dL (ref 70–99)
Potassium: 3.7 mmol/L (ref 3.5–5.1)
Sodium: 130 mmol/L — ABNORMAL LOW (ref 135–145)
Total Bilirubin: 1.8 mg/dL — ABNORMAL HIGH (ref 0.3–1.2)
Total Protein: 6 g/dL — ABNORMAL LOW (ref 6.5–8.1)

## 2021-05-12 LAB — CBC WITH DIFFERENTIAL/PLATELET
Abs Immature Granulocytes: 0.71 10*3/uL — ABNORMAL HIGH (ref 0.00–0.07)
Basophils Absolute: 0 10*3/uL (ref 0.0–0.1)
Basophils Relative: 1 %
Eosinophils Absolute: 0 10*3/uL (ref 0.0–0.5)
Eosinophils Relative: 0 %
HCT: 23.3 % — ABNORMAL LOW (ref 39.0–52.0)
Hemoglobin: 7.5 g/dL — ABNORMAL LOW (ref 13.0–17.0)
Immature Granulocytes: 12 %
Lymphocytes Relative: 8 %
Lymphs Abs: 0.4 10*3/uL — ABNORMAL LOW (ref 0.7–4.0)
MCH: 33.5 pg (ref 26.0–34.0)
MCHC: 32.2 g/dL (ref 30.0–36.0)
MCV: 104 fL — ABNORMAL HIGH (ref 80.0–100.0)
Monocytes Absolute: 0.3 10*3/uL (ref 0.1–1.0)
Monocytes Relative: 4 %
Neutro Abs: 4.4 10*3/uL (ref 1.7–7.7)
Neutrophils Relative %: 75 %
Platelets: 169 10*3/uL (ref 150–400)
RBC: 2.24 MIL/uL — ABNORMAL LOW (ref 4.22–5.81)
RDW: 14.6 % (ref 11.5–15.5)
WBC Morphology: INCREASED
WBC: 5.8 10*3/uL (ref 4.0–10.5)
nRBC: 0 % (ref 0.0–0.2)

## 2021-05-12 LAB — D-DIMER, QUANTITATIVE: D-Dimer, Quant: 2.54 ug/mL-FEU — ABNORMAL HIGH (ref 0.00–0.50)

## 2021-05-12 LAB — C-REACTIVE PROTEIN: CRP: 11.1 mg/dL — ABNORMAL HIGH (ref <1.0)

## 2021-05-12 LAB — FERRITIN: Ferritin: 1231 ng/mL — ABNORMAL HIGH (ref 24–336)

## 2021-05-12 MED ORDER — GUAIFENESIN ER 600 MG PO TB12
600.0000 mg | ORAL_TABLET | Freq: Two times a day (BID) | ORAL | Status: DC
  Administered 2021-05-12 – 2021-05-17 (×11): 600 mg via ORAL
  Filled 2021-05-12 (×12): qty 1

## 2021-05-12 NOTE — Progress Notes (Signed)
?PROGRESS NOTE ? ? ? ?Ricky [name]  ZOX:[id number] DOB: [birth year] DOA: 05/09/2021 ?PCP: Pcp, No  ? ?  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 ? ?New events last 24 hours / Subjective: ?Gen surg to see pt after weekend for excisional biopsy. Reports congestion in chest. Being tx for disseminated MAC. No wheezing, SOB. Some coughing.  ? ?Assessment & Plan: ?  ?Principal Problem: ?  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            Appreciate ID ?            Azithromycin 500 mg daily ?            Ethambutol 1000 mg daily ?            Rifabutin 300 mg daily ?            Adenopathy noted on PET from 3/30; ID rec'd excisional biopsy by gen surgery, I called Dr. Kieth Brightly who agreed to see the pt, but no biopsy this weekend ? Will add pulm toilet and Mucinex ?  ?Active Problems: ?  Cryptococcal meningitis ([place]) ?            Fluconazole 200 mg daily ?  ?  AIDS (acquired immune deficiency syndrome) ([place]) ?            Truvada 200-300 mg daily ?            Tivicay 50 mg daily ?            Bactrim 400-80 mg daily for prophylaxis ?  ?  Nocardia infection ?            Holding on treatment per ID ?  ?  Hyponatremia ?            Monitor BMP ?  ?  Covid 19 ?            Molnupiravir day 3/5 ?  ?  GERD without esophagitis ?            Pepcid 20 mg twice daily ?            Protonix 40 mg daily ?  ?  Protein-calorie malnutrition, severe ?            Ensure ?  ?Nutrition Problem: Severe Malnutrition ?Etiology: chronic illness (HIV) ?  ?  ?DVT prophylaxis: Lovenox ?Code Status: Full ?Family Communication: Self, spoke on  phone w brother [name] ?Coming From: Home ?Disposition Plan: Pending ?Barriers to Discharge: Clinical improvement ?  ?Consultants:  ?ID ?General Surgery ?  ?Procedures:  ?None ? ?Antimicrobials:  ?Anti-infectives (From admission, onward)  ? ? Start     Dose/Rate Route Frequency Ordered Stop  ? 05/11/21 1400  fluconazole (DIFLUCAN) tablet 200 mg       ? 200 mg Oral Daily 05/11/21 1303    ? 05/11/21 1000  emtricitabine-tenofovir (TRUVADA) 200-300 MG per tablet 1 tablet       ? 1 tablet Oral Daily 05/10/21 1320    ? 05/10/21 1000  azithromycin (ZITHROMAX) tablet 500 mg       ? 500 mg Oral Daily 05/09/21 2237    ?  05/10/21 1000  dolutegravir (TIVICAY) tablet 50 mg       ? 50 mg Oral Daily 05/09/21 2237    ? 05/10/21 1000  emtricitabine-tenofovir AF (DESCOVY) 200-25 MG per tablet 1 tablet  Status:  Discontinued       ? 1 tablet Oral Daily 05/09/21 2237 05/10/21 1320  ? 05/10/21 1000  ethambutol (MYAMBUTOL) tablet 1,000 mg       ? 1,000 mg Oral Daily 05/09/21 2237    ? 05/10/21 1000  sulfamethoxazole-trimethoprim (BACTRIM) 400-80 MG per tablet 1 tablet       ? 1 tablet Oral Daily 05/09/21 2237    ? 05/10/21 1000  rifabutin (MYCOBUTIN) capsule 300 mg       ? 300 mg Oral Daily 05/09/21 2237    ? 05/10/21 1000  molnupiravir EUA (LAGEVRIO) capsule 800 mg       ? 4 capsule Oral 2 times daily 05/10/21 0350 05/15/21 0959  ? ?  ? ? ? ?Objective: ?Vitals:  ? 05/11/21 1659 05/11/21 2000 05/12/21 0500 05/12/21 0926  ?BP: 124/90 122/87 120/80 (!) 116/93  ?Pulse: (!) 101 98 95 97  ?Resp: _0 ?Temp: 97.8 ?F (36.6 ?C) 98.1 ?F (36.7 ?C) 98.1 ?F (36.7 ?C) 97.8 ?F (36.6 ?C)  ?TempSrc:  Oral Oral   ?SpO2: 95% 96% 96% 100%  ?Weight:      ?Height:      ? ? ?Intake/Output Summary (Last 24 hours) at 05/12/2021 1112 ?Last data filed at 05/12/2021 0900 ?Gross per 24 hour  ?Intake 240 ml  ?Output 750 ml  ?Net -510 ml  ? ?Filed Weights  ? 05/10/21 0003  ?Weight: 63.6 kg  ? ? ?Examination:  ?General exam: Appears calm and comfortable   ?Respiratory system: Clear b/l, +transmitted upper airway nosies. Respiratory effort normal. No respiratory distress. No conversational dyspnea.  ?Cardiovascular system: S1 & S2 heard, RRR. No pedal edema. ?Gastrointestinal system: Abdomen is nondistended, soft and nontender. Normal bowel sounds heard. ?Central nervous system: Alert and oriented. No focal neurological deficits. Speech clear.  ?Extremities: Symmetric in appearance  ?Skin: No rashes, lesions or ulcers on exposed skin  ?Psychiatry: Mood & affect appropriate.  ? ?Data Reviewed: I have personally reviewed following labs and imaging studies ? ?CBC: ?Recent Labs  ?Lab 05/09/21 ?2251 05/10/21 ?0251 05/11/21 ?2248 05/12/21 ?0901  ?WBC 4.5 4.2 4.6 5.8  ?NEUTROABS 3.3  --  3.6 4.4  ?HGB 7.1* 7.1* 7.3* 7.5*  ?HCT 21.0* 21.1* 21.7* 23.3*  ?MCV 100.0 99.5 101.4* 104.0*  ?PLT 164 141* 122* 169  ? ?Basic Metabolic Panel: ?Recent Labs  ?Lab 05/09/21 ?2251 05/10/21 ?0251 05/12/21 ?0901  ?NA 125* 127* 130*  ?K 4.3 4.2 3.7  ?CL 97* 100 103  ?CO2 _1 ?GLUCOSE 74 60* 72  ?BUN _2 ?CREATININE 1.17 1.07 0.98  ?CALCIUM 7.6* 7.5* 7.2*  ?MG 1.8  --   --   ? ?GFR: ?Estimated Creatinine Clearance: 69.4 mL/min (by C-G formula based on SCr of 0.98 mg/dL). ? ?Liver Function Tests: ?Recent Labs  ?Lab 05/09/21 ?2251 05/10/21 ?0251 05/12/21 ?0901  ?AST 65* 59* 55*  ?ALT 37 35 30  ?ALKPHOS 184* 168* 138*  ?BILITOT 1.8* 1.7* 1.8*  ?PROT 6.1* 5.7* 6.0*  ?ALBUMIN <1.5* <1.5* <1.5*  ? ?Coagulation Profile: ?Recent Labs  ?Lab 05/09/21 ?2251  ?INR 1.4*  ? ?Anemia Panel: ?Recent Labs  ?  05/11/21 ?0333 05/12/21 ?0901  ?FERRITIN 1,048* 1,231*  ? ?Sepsis Labs: ?Recent Labs  ?  Lab 05/10/21 ?0727  ?PROCALCITON 1.62  ? ? ?Recent Results (from the past 240 hour(s))  ?Resp Panel by RT-PCR (Flu A&B, Covid) Nasopharyngeal Swab     Status: Abnormal  ? Collection Time: 05/09/21 11:24 PM  ? Specimen: Nasopharyngeal Swab; Nasopharyngeal(NP) swabs in vial transport medium  ?Result Value Ref  Range Status  ? SARS Coronavirus 2 by RT PCR POSITIVE (A) NEGATIVE Final  ?  Comment: (NOTE) ?SARS-CoV-2 target nucleic acids are DETECTED. ? ?The SARS-CoV-2 RNA is generally detectable in upper respiratory ?specimens during the acute phase of infection. Positive results are ?indicative of the presence of the identified virus, but do not rule ?out bacterial infection or co-infection with other pathogens not ?detected by the test. Clinical correlation with patient history and ?other diagnostic information is necessary to determine patient ?infection status. The expected result is Negative. ? ?Fact Sheet for Patients: ?EntrepreneurPulse.com.au ? ?Fact Sheet for Healthcare Providers: ?IncredibleEmployment.be ? ?This test is not yet approved or cleared by the [place] FDA and  ?has been authorized for detection and/or diagnosis of SARS-CoV-2 by ?FDA under an Emergency Use Authorization (EUA).  This EUA will ?remain in effect (meaning this test can be used) for the duration of  ?the COVID-19 declaration under Section 564(b)(1) of the A ct, 21 ?U.S.C. section 360bbb-3(b)(1), unless the authorization is ?terminated or revoked sooner. ? ?  ? Influenza A by PCR NEGATIVE NEGATIVE Final  ? Influenza B by PCR NEGATIVE NEGATIVE Final  ?  Comment: (NOTE) ?The Xpert Xpress SARS-CoV-2/FLU/RSV plus assay is intended as an aid ?in the diagnosis of influenza from Nasopharyngeal swab specimens and ?should not be used as a sole basis for treatment. Nasal washings and ?aspirates are unacceptable for Xpert Xpress SARS-CoV-2/FLU/RSV ?testing. ? ?Fact Sheet for Patients: ?EntrepreneurPulse.com.au ? ?Fact Sheet for Healthcare Providers: ?IncredibleEmployment.be ? ?This test is not yet approved or cleared by the [place] FDA and ?has been authorized for detection and/or diagnosis of SARS-CoV-2 by ?FDA under an Emergency Use Authorization (EUA). This EUA will  remain ?in effect (meaning this test can be used) for the duration of the ?COVID-19 declaration under Section 564(b)(1) of the Act, 21 U.S.C. ?section 360bbb-3(b)(1), unless the authorization is terminated or ?revoked.

## 2021-05-13 ENCOUNTER — Telehealth: Payer: Self-pay

## 2021-05-13 ENCOUNTER — Other Ambulatory Visit: Payer: Medicaid Other | Admitting: Internal Medicine

## 2021-05-13 DIAGNOSIS — A312 Disseminated mycobacterium avium-intracellulare complex (DMAC): Secondary | ICD-10-CM | POA: Diagnosis not present

## 2021-05-13 LAB — CBC WITH DIFFERENTIAL/PLATELET
Abs Immature Granulocytes: 0.73 10*3/uL — ABNORMAL HIGH (ref 0.00–0.07)
Basophils Absolute: 0 10*3/uL (ref 0.0–0.1)
Basophils Relative: 1 %
Eosinophils Absolute: 0 10*3/uL (ref 0.0–0.5)
Eosinophils Relative: 0 %
HCT: 22.4 % — ABNORMAL LOW (ref 39.0–52.0)
Hemoglobin: 7.2 g/dL — ABNORMAL LOW (ref 13.0–17.0)
Immature Granulocytes: 13 %
Lymphocytes Relative: 4 %
Lymphs Abs: 0.2 10*3/uL — ABNORMAL LOW (ref 0.7–4.0)
MCH: 33.3 pg (ref 26.0–34.0)
MCHC: 32.1 g/dL (ref 30.0–36.0)
MCV: 103.7 fL — ABNORMAL HIGH (ref 80.0–100.0)
Monocytes Absolute: 0.3 10*3/uL (ref 0.1–1.0)
Monocytes Relative: 5 %
Neutro Abs: 4.3 10*3/uL (ref 1.7–7.7)
Neutrophils Relative %: 77 %
Platelets: 144 10*3/uL — ABNORMAL LOW (ref 150–400)
RBC: 2.16 MIL/uL — ABNORMAL LOW (ref 4.22–5.81)
RDW: 14.7 % (ref 11.5–15.5)
WBC: 5.5 10*3/uL (ref 4.0–10.5)
nRBC: 0 % (ref 0.0–0.2)

## 2021-05-13 LAB — COMPREHENSIVE METABOLIC PANEL
ALT: 28 U/L (ref 0–44)
AST: 54 U/L — ABNORMAL HIGH (ref 15–41)
Albumin: <1.5 g/dL — ABNORMAL LOW (ref 3.5–5.0)
Alkaline Phosphatase: 126 U/L (ref 38–126)
Anion gap: 2 — ABNORMAL LOW (ref 5–15)
BUN: 11 mg/dL (ref 8–23)
CO2: 22 mmol/L (ref 22–32)
Calcium: 7.6 mg/dL — ABNORMAL LOW (ref 8.9–10.3)
Chloride: 105 mmol/L (ref 98–111)
Creatinine, Ser: 0.98 mg/dL (ref 0.61–1.24)
GFR, Estimated: >60 mL/min (ref >60)
Glucose, Bld: 84 mg/dL (ref 70–99)
Potassium: 4.1 mmol/L (ref 3.5–5.1)
Sodium: 129 mmol/L — ABNORMAL LOW (ref 135–145)
Total Bilirubin: 1.8 mg/dL — ABNORMAL HIGH (ref 0.3–1.2)
Total Protein: 6.1 g/dL — ABNORMAL LOW (ref 6.5–8.1)

## 2021-05-13 LAB — FERRITIN: Ferritin: 1288 ng/mL — ABNORMAL HIGH (ref 24–336)

## 2021-05-13 LAB — C-REACTIVE PROTEIN: CRP: 13.6 mg/dL — ABNORMAL HIGH (ref <1.0)

## 2021-05-13 LAB — D-DIMER, QUANTITATIVE: D-Dimer, Quant: 2.43 ug/mL-FEU — ABNORMAL HIGH (ref 0.00–0.50)

## 2021-05-13 LAB — T-HELPER CELLS (CD4) COUNT (NOT AT ARMC)
CD4 % Helper T Cell: 1 % — ABNORMAL LOW (ref 33–65)
CD4 T Cell Abs: <35 /uL — ABNORMAL LOW (ref 400–1790)

## 2021-05-13 MED ORDER — OXYCODONE HCL 5 MG PO TABS
5.0000 mg | ORAL_TABLET | Freq: Two times a day (BID) | ORAL | Status: DC | PRN
  Administered 2021-05-13 – 2021-05-20 (×5): 5 mg via ORAL
  Filled 2021-05-13 (×5): qty 1

## 2021-05-13 NOTE — Progress Notes (Addendum)
?   ? ? ? ? ?[facility] Infectious Disease ? ?Date of Admission:  05/09/2021    ? ?Principal Problem: ?  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  Cryptococcal meningitis ([place]) ?  AIDS (acquired immune deficiency syndrome) ([place]) ?  COVID-19 ?  Nocardia infection ?  Hyponatremia ?  GERD without esophagitis ?  Protein-calorie malnutrition, severe ?     ?    ?Assessment: ?8 YM with Disseminated MAC(+ Blood Cx on 12/22 S macrolide) on three drug regimen, HIV/AIDS VL 111 on 05/07/21, Cd4 <35 on 03/26/21, seen by Heme-onc for possible lymphoproliferative disorder SP core Bx+ nocardia, direct admitted from ID clinic for management of nocardia. ?  ?#Disseminated MAC ?-Spoke with Dr, Picklesimer, Pathology and requested case  reviewed as pt has history of MAC. Amendment made that organism is consistent with Nocardia to MAC.  ?-General surgery consulted and no plans for excisional Bx as no superficial inguinal/axillary LN enlarged(excisional Bx of periaortic nodes deemed high risk) ?-MRI head no evidence of CNS infection ?Recommendations:  ?-Agree with oncology engagement for lymphoma work-up ?-Continue MAC therapy 3 drug regimen rifabutin, azithromycin and ethambutol(started 04/04/21) ?-Follow blood Cx ?  ?#HIV-AIDS ?-CD4 ?-Continue Truvada and Tivicay ?-bactrim for PJP PPX ?  ?#Hx of Cryptococcal meningitis ?-Continue fluconazole for daily maintenance ?  ?  ?#Chronic COVID ?-Monitor for now ?-CT 20.4(44 cutoff). On malnupiravir x 6d ?-Pt has no respiratory symptoms ?  ?  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  ?#Substance abuse ?-Reports he recently used drugs, denies recent IVDA. He did not wish to offer any further information.  ? ?Microbiology:   ?Antibiotics: ?MAC therapy ?HIV ?Truvada and Tivicay ?Bactrim due to PPX ?  ? ?SUBJECTIVE: ?Resting in bed. Reports he does not want to be "annoyed".  ? ?Review of Systems: ?ROS ? ? ?Scheduled Meds: ? vitamin C  500 mg Oral Daily  ?  azithromycin  500 mg Oral Daily  ? dolutegravir  50 mg Oral Daily  ? emtricitabine-tenofovir  1 tablet Oral Daily  ? enoxaparin (LOVENOX) injection  40 mg Subcutaneous Q24H  ? ethambutol  1,000 mg Oral Daily  ? famotidine  20 mg Oral BID  ? feeding supplement  237 mL Oral TID BM  ? fluconazole  200 mg Oral Daily  ? folic acid  1 mg Oral Daily  ? guaiFENesin  600 mg Oral BID  ? mirtazapine  15 mg Oral QHS  ? molnupiravir EUA  4 capsule Oral BID  ? pantoprazole  20 mg Oral Daily  ? rifabutin  300 mg Oral Daily  ? sulfamethoxazole-trimethoprim  1 tablet Oral Daily  ? zinc sulfate  220 mg Oral Daily  ? ?Continuous Infusions: ? sodium chloride 100 mL/hr at 05/10/21 1041  ? ?PRN Meds:.acetaminophen **OR** acetaminophen, chlorpheniramine-HYDROcodone, guaiFENesin-dextromethorphan, labetalol, magnesium hydroxide, ondansetron **OR** ondansetron (ZOFRAN) IV, oxyCODONE ?Allergies  ?Allergen Reactions  ? Bactrim [Sulfamethoxazole-Trimethoprim] Other (See Comments)  ?  Patient was trialed on DS TIW and SS daily for PJP prophylaxis and developed hyperkalemia and increased Scr (pt is currently taking bactrim per infectious disease notes 05/07/21)  ? ? ?OBJECTIVE: ?Vitals:  ? 05/12/21 0926 05/12/21 1716 05/12/21 2051 05/13/21 0923  ?BP: (!) 116/93 (!) 135/99 (!) 124/94 124/89  ?Pulse: 97 100 100 (!) 102  ?Resp: _0 ?Temp: 97.8 ?F (36.6 ?C) 98.3 ?F (36.8 ?C) 98.3 ?F (36.8 ?C) 98.5 ?F (36.9 ?C)  ?TempSrc:   Oral   ?SpO2: 100% 100%  99% 98%  ?Weight:      ?Height:      ? ?Body mass index is 18.5 kg/m?. ? ?Physical Exam ?Constitutional:   ?   General: He is not in acute distress. ?   Appearance: He is not toxic-appearing.  ?HENT:  ?   Head: Normocephalic and atraumatic.  ?   Right Ear: External ear normal.  ?   Left Ear: External ear normal.  ?   Nose: No congestion or rhinorrhea.  ?   Mouth/Throat:  ?   Mouth: Mucous membranes are moist.  ?   Pharynx: Oropharynx is clear.  ?Eyes:  ?   Extraocular Movements: Extraocular movements  intact.  ?   Conjunctiva/sclera: Conjunctivae normal.  ?   Pupils: Pupils are equal, round, and reactive to light.  ?Cardiovascular:  ?   Rate and Rhythm: Normal rate and regular rhythm.  ?   Heart sounds: No murmur heard. ?  No friction rub. No gallop.  ?Pulmonary:  ?   Effort: Pulmonary effort is normal.  ?   Breath sounds: Normal breath sounds.  ?Abdominal:  ?   General: Abdomen is flat. Bowel sounds are normal.  ?   Palpations: Abdomen is soft.  ?Musculoskeletal:     ?   General: No swelling. Normal range of motion.  ?   Cervical back: Normal range of motion and neck supple.  ?Skin: ?   General: Skin is warm and dry.  ?Neurological:  ?   General: No focal deficit present.  ?   Mental Status: He is oriented to person, place, and time.  ?Psychiatric:     ?   Mood and Affect: Mood normal.  ? ? ? ? ?Lab Results ?Lab Results  ?Component Value Date  ? WBC 5.5 05/13/2021  ? HGB 7.2 (L) 05/13/2021  ? HCT 22.4 (L) 05/13/2021  ? MCV 103.7 (H) 05/13/2021  ? PLT 144 (L) 05/13/2021  ?  ?Lab Results  ?Component Value Date  ? CREATININE 0.98 05/13/2021  ? BUN 11 05/13/2021  ? NA 129 (L) 05/13/2021  ? K 4.1 05/13/2021  ? CL 105 05/13/2021  ? CO2 22 05/13/2021  ?  ?Lab Results  ?Component Value Date  ? ALT 28 05/13/2021  ? AST 54 (H) 05/13/2021  ? ALKPHOS 126 05/13/2021  ? BILITOT 1.8 (H) 05/13/2021  ?  ? ? ? ? ?Laurice Record, MD ?[facility] ?[facility] ?05/13/2021, 2:58 PM  ?

## 2021-05-13 NOTE — Telephone Encounter (Signed)
Thank you Donna 

## 2021-05-13 NOTE — Plan of Care (Signed)

## 2021-05-13 NOTE — Progress Notes (Signed)
?PROGRESS NOTE ? ? ? ?EDWORD CU  WGN:[id number] DOB: [birth year] DOA: 05/09/2021 ?PCP: Pcp, No ? ? 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 ? ?Assessment & Plan: ?  ?Principal Problem: ?  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  Nocardia infection ?  COVID-19 ?  Hyponatremia ?  Cryptococcal meningitis ([place]) ?  AIDS (acquired immune deficiency syndrome) ([place]) ?  GERD without esophagitis ?  Protein-calorie malnutrition, severe ? 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 ? ?Goals of care ?-Lengthy discussion over the phone today with patient's Sister [name] ?-Patient has multiple siblings involved in his care but [name] is the primary caretaker and is the primary decision maker when the patient is unable to make his own decisions ?-Discussed at length the patient's poor prognosis given profoundly uncontrolled HIV, multiple infections, sepsis criteria COVID-positive status and concern for possible neoplastic process as noted on PET scan. ?-We discussed that if patient were to have  confirmation of neoplastic disease he would likely be unable to tolerate chemo or radiation at this point and as such will not follow at this time for excisional biopsy. ?-Certainly if patient's status continues to improve while controlling his multiple infections and HIV it would be worth further discussion on moving forward for excisional biopsy once a safe location and biopsy site is located. ? ?Cryptococcal meningitis ([place]) ?Continue fluconazole per ID ?  ?AIDS (acquired immune deficiency syndrome) ([place]) ?Profoundly uncontrolled in the setting of medication noncompliance ?ID following, continue Truvada, Tivicay, Bactrim ?Lab Results  ?Component Value Date  ? HIV1RNAQUANT 111 (H) 05/07/2021  ? ?Nocardia infection ?Holding on treatment per ID given above ?  ?Hyponatremia ?Monitor BMP ?  ?Covid 19 positive status  ?- Unclear if active/acute infection vs incidental/prolonged positivity due to immunocompromised state ?- Molnupiravir  -last dose 05/14/2021 ? ? ?GERD without esophagitis ?Continue Pepcid, Protonix  ? ?Severe protein-calorie malnutrition ?Continue calorie supplementation with increased p.o. intake and ensure as tolerated ?  ?DVT prophylaxis: Lovenox ?Code Status: Full ?Family Communication: Lengthy discussion over the phone with Sister [name] ? ?Status is: 60mn ? ?Dispo: The patient is from: Home ?             Anticipated d/c is to: To be determined ?             Anticipated d/c date is: 72+ hours ?             Patient currently not medically stable for discharge ? ?Consultants:  ?General surgery, infectious disease ? ?Procedures:  ?None ? ?Antimicrobials:  ?Azithromycin, fluconazole, molnupiravir, Bactrim, ethambutol ? ?Subjective: ?No acute issues or events noted overnight, patient somnolent, review of systems markedly limited due to mental status currently ? ?Objective: ?Vitals:  ? 05/12/21 0500  05/12/21 0926 05/12/21 1716 05/12/21 2051  ?BP: 120/80 (!) 116/93 (!) 135/99 (!) 124/94  ?Pulse: 95 97 100  100  ?Resp: _0 ?Temp: 98.1 ?F (36.7 ?C) 97.8 ?F (36.6 ?C) 98.3 ?F (36.8 ?C) 98.3 ?F (36.8 ?C)  ?TempSrc: Oral   Oral  ?SpO2: 96% 100% 100% 99%  ?Weight:      ?Height:      ? ? ?Intake/Output Summary (Last 24 hours) at 05/13/2021 0709 ?Last data filed at 05/12/2021 1855 ?Gross per 24 hour  ?Intake 780 ml  ?Output 750 ml  ?Net 30 ml  ? ?Filed Weights  ? 05/10/21 0003  ?Weight: 63.6 kg  ? ? ?Examination: ? ?General: Somnolent, no acute distress. ?Lungs: Bilateral rhonchi without overt wheeze or rales. ?Heart:  Regular rate and rhythm.  Without murmurs, rubs, or gallops. ?Abdomen:  Soft, nontender, nondistended.  Without guarding or rebound. ?Extremities: Without cyanosis, clubbing, edema, or obvious deformity. ? ?Data Reviewed: I have personally reviewed following labs and imaging studies ? ?CBC: ?Recent Labs  ?Lab 05/09/21 ?2251 05/10/21 ?0251 05/11/21 ?0973 05/12/21 ?0901  ?WBC 4.5 4.2 4.6 5.8  ?NEUTROABS 3.3  --  3.6 4.4  ?HGB 7.1* 7.1* 7.3* 7.5*  ?HCT 21.0* 21.1* 21.7* 23.3*  ?MCV 100.0 99.5 101.4* 104.0*  ?PLT 164 141* 122* 169  ? ?Basic Metabolic Panel: ?Recent Labs  ?Lab 05/09/21 ?2251 05/10/21 ?0251 05/12/21 ?0901  ?NA 125* 127* 130*  ?K 4.3 4.2 3.7  ?CL 97* 100 103  ?CO2 _1 ?GLUCOSE 74 60* 72  ?BUN _2 ?CREATININE 1.17 1.07 0.98  ?CALCIUM 7.6* 7.5* 7.2*  ?MG 1.8  --   --   ? ?GFR: ?Estimated Creatinine Clearance: 69.4 mL/min (by C-G formula based on SCr of 0.98 mg/dL). ?Liver Function Tests: ?Recent Labs  ?Lab 05/09/21 ?2251 05/10/21 ?0251 05/12/21 ?0901  ?AST 65* 59* 55*  ?ALT 37 35 30  ?ALKPHOS 184* 168* 138*  ?BILITOT 1.8* 1.7* 1.8*  ?PROT 6.1* 5.7* 6.0*  ?ALBUMIN <1.5* <1.5* <1.5*  ? ?No results for input(s): LIPASE, AMYLASE in the last 168 hours. ?No results for input(s): AMMONIA in the last 168 hours. ?Coagulation Profile: ?Recent Labs  ?Lab 05/09/21 ?2251  ?INR 1.4*  ? ?Cardiac Enzymes: ?No results for input(s): CKTOTAL, CKMB, CKMBINDEX, TROPONINI in the last 168 hours. ?BNP (last 3  results) ?No results for input(s): PROBNP in the last 8760 hours. ?HbA1C: ?No results for input(s): HGBA1C in the last 72 hours. ?CBG: ?No results for input(s): GLUCAP in the last 168 hours. ?Lipid Profile: ?No results for input(s): CHOL, HDL, LDLCALC, TRIG, CHOLHDL, LDLDIRECT in the last 72 hours. ?Thyroid Function Tests: ?No results for input(s): TSH, T4TOTAL, FREET4, T3FREE, THYROIDAB in the last 72 hours. ?Anemia Panel: ?Recent Labs  ?  05/11/21 ?0333 05/12/21 ?0901  ?FERRITIN 1,048* 1,231*  ? ?Sepsis Labs: ?Recent Labs  ?Lab 05/10/21 ?0727  ?PROCALCITON 1.62  ? ? ?Recent Results (from the past 240 hour(s))  ?Resp Panel by RT-PCR (Flu A&B, Covid) Nasopharyngeal Swab     Status: Abnormal  ? Collection Time: 05/09/21 11:24 PM  ? Specimen: Nasopharyngeal Swab; Nasopharyngeal(NP) swabs in vial transport medium  ?Result Value Ref Range Status  ? SARS Coronavirus 2 by RT PCR POSITIVE (A) NEGATIVE Final  ?  Comment: (NOTE) ?SARS-CoV-2 target nucleic acids are DETECTED. ? ?The SARS-CoV-2 RNA is generally detectable in upper respiratory ?specimens during the acute phase of infection. Positive results are ?indicative of the presence of the  identified virus, but do not rule ?out bacterial infection or co-infection with other pathogens not ?detected by the test. Clinical correlation with patient history and ?other diagnostic information is necessary to determine patient ?infection status. The expected result is Negative. ? ?Fact Sheet for Patients: ?EntrepreneurPulse.com.au ? ?Fact Sheet for Healthcare Providers: ?IncredibleEmployment.be ? ?This test is not yet approved or cleared by the [place] FDA and  ?has been authorized for detection and/or diagnosis of SARS-CoV-2 by ?FDA under an Emergency Use Authorization (EUA).  This EUA will ?remain in effect (meaning this test can be used) for the duration of  ?the COVID-19 declaration under Section 564(b)(1) of the [place], 21 ?U.S[place]C.  section 360bbb-3(b)(1), unless the authorization is ?terminated or revoked sooner. ? ?  ? Influenza A by PCR NEGATIVE NEGATIVE Final  ? Influenza B by PCR NEGATIVE NEGATIVE Final  ?  Comment: (NOTE) ?The Xpert Xpr

## 2021-05-13 NOTE — Evaluation (Signed)
Clinical/Bedside Swallow Evaluation ?Patient Details  ?Name: Ricky [name] ?MRN: [id number] ?Date of Birth: 1955-08-10 ? ?Today's Date: 05/13/2021 ?Time: SLP Start Time (ACUTE ONLY): 5188 SLP Stop Time (ACUTE ONLY): 1410 ?SLP Time Calculation (min) (ACUTE ONLY): 12 min ? ?Past Medical History:  ?Past Medical History:  ?Diagnosis Date  ? Human immunodeficiency virus (HIV) disease ([place]) 03/26/2020  ? ?Past Surgical History:  ?Past Surgical History:  ?Procedure Laterality Date  ? IR FL GUIDED LOC OF NEEDLE/CATH TIP FOR SPINAL INJECTION LT  10/17/2020  ? SVT ABLATION N/A 04/23/2020  ? Procedure: SVT ABLATION;  Surgeon: Evans Lance, MD;  Location: [facility];  Service: Cardiovascular;  Laterality: N/A;  ? 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 Noted by nurse today to have gagging episode while taking pills, c/o difficulty swallowing.  ?  ?Assessment / Plan / Recommendation  ?Clinical Impression ? Pt participated in clinical swallowing assessment.  Normal oral mechanism exam; no focal deficits; dentition present. Voice strong/clear. Normal cough. Accepted solids/purees with thorough mastication. Consumed consecutive sips of water with no s/s of aspiration. Laryngeal elevation was palpable. No oral residue post-swallow. There was no indication of a biomechanical dysphagia.  Pt with no complaints other than producing more phlegm than usual. D/W nurse after session - recommend trying meds whole in puree/applesauce then following with water.  Continue regular diet/thin liquids. No SLP f/u  warranted. ?SLP Visit Diagnosis: Dysphagia, unspecified (R13.10) ?   ?Aspiration Risk ? No limitations  ?  ?Diet Recommendation   Regular solids, thin liquids ? ?Medication Administration: Whole meds with puree  ?  ?Other  Recommendations Oral Care Recommendations: Oral care BID   ? ?Recommendations for follow up therapy are one component of a multi-disciplinary discharge planning process, led by the attending physician.  Recommendations may be updated based on patient status, additional functional criteria and insurance authorization. ? ?Follow up Recommendations No SLP follow up  ? ? ?  ?Assistance Recommended at Discharge None  ? ? 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 Noted by nurse today to have gagging episode while taking pills, c/o difficulty swallowing. ?Type of Study: Bedside Swallow Evaluation ?Previous Swallow Assessment: no ?Diet Prior to this Study: Regular;Thin liquids ?Temperature Spikes Noted: No ?Respiratory Status: Room air ?History of Recent Intubation: No ?Behavior/Cognition: Alert ?Oral Cavity Assessment: Within Functional Limits ?Oral Care Completed by SLP: No ?Oral Cavity - Dentition: Adequate natural dentition ?Vision: Functional for self-feeding ?Self-Feeding Abilities: Able to feed self ?Patient Positioning: Upright in bed ?Baseline Vocal Quality: Normal ?Volitional Cough: Strong ?Volitional Swallow: Able to elicit  ?  ?Oral/Motor/Sensory Function Overall Oral Motor/Sensory Function: Within functional limits   ?Ice Chips Ice chips: Within  functional limits   ?Thin Liquid Thin Liquid: Within functional  limits  ?  ?Nectar Thick Nectar Thick Liquid: Not tested   ?Honey Thick Honey Thick Liquid: Not tested   ?Puree Puree: Within functional limits   ?Solid ? ? ?  Solid: Within functional limits  ? ?  ? ?Ricky [name] ?05/13/2021,2:21 PM ? ?Erielle Gawronski L. Antoneo Ghrist, MA CCC/SLP ?Acute [facility] Services ?Office number [phone] ?Pager [phone] ? ? ?

## 2021-05-13 NOTE — Consult Note (Addendum)
[facility] ?Consult Note ? ?[name] ?1955/08/13  ?[id number].   ? ?Requesting MD: Scharlene Gloss ?Chief Complaint/Reason for Consult: lymph node biopsy ? ?HPI:  ?Ricky [name] is a 67yo male PMH AIDS/HIV, history of crypto meningitis partially treated, distant treated TB with chronic cavitary lung disease, recently diagnosed disseminated MAC, chronic/recurrent covid infection who was admitted to [facility] 05/09/21 with generalized fatigue, no appetite, and cough. He recently underwent CT biopsy left RP adenopathy by IR on 05/01/21 that shows what appears like MAC. Unable to rule out lymphoma therefore we are asked to see for consideration of an excisional lymph node biopsy. ? ? ?Family History  ?Problem Relation Age of Onset  ? Hypertension Mother   ? Kidney disease Mother   ? Hypertension Sister   ? Hypertension Brother   ? Kidney cancer Brother   ? Colon cancer Neg Hx   ? Liver cancer Neg Hx   ? Pancreatic cancer Neg Hx   ? Esophageal cancer Neg Hx   ? ? ?Past Medical History:  ?Diagnosis Date  ? Human immunodeficiency virus (HIV) disease ([place]) 03/26/2020  ? ? ?Past Surgical History:  ?Procedure Laterality Date  ? IR FL GUIDED LOC OF NEEDLE/CATH TIP FOR SPINAL INJECTION LT  10/17/2020  ? SVT ABLATION N/A 04/23/2020  ? Procedure: SVT ABLATION;  Surgeon: Evans Lance, MD;  Location: [facility];  Service: Cardiovascular;  Laterality: N/A;  ? ? ?Social History:  reports that he has quit smoking. His smoking use included cigarettes. He has quit using smokeless tobacco. He reports current alcohol use of about 14.0 - 21.0 standard drinks per week. He reports that he does not currently use drugs. ? ?Allergies:  ?Allergies  ?Allergen Reactions  ? Bactrim [Sulfamethoxazole-Trimethoprim] Other (See Comments)  ?  Patient was trialed on DS TIW and SS daily for PJP prophylaxis and developed hyperkalemia and increased Scr (pt is currently taking bactrim per infectious disease notes 05/07/21)  ? ? ?Medications  Prior to Admission  ?Medication Sig Dispense Refill  ? acetaminophen (TYLENOL) 500 MG tablet Take 500 mg by mouth every 6 (six) hours as needed for moderate pain or headache.    ? azithromycin (ZITHROMAX) 500 MG tablet Take 1 tablet (500 mg total) by mouth daily. 30 tablet 11  ? dolutegravir (TIVICAY) 50 MG tablet Take 1 tablet (50 mg total) by mouth daily. 30 tablet 11  ? emtricitabine-tenofovir (TRUVADA) 200-300 MG tablet Take 1 tablet by mouth daily. 30 tablet 11  ? ethambutol (MYAMBUTOL) 400 MG tablet Take 2.5 tablets (1,000 mg total) by mouth daily. 75 tablet 11  ? fluconazole (DIFLUCAN) 200 MG tablet Take 1 tablet (200 mg total) by mouth daily. 30 tablet 11  ? oxyCODONE (OXY IR/ROXICODONE) 5 MG immediate release tablet Take 1-2 tablets (5-10 mg total) by mouth every 6 (six) hours as needed for severe pain. 90 tablet 0  ? rifabutin (MYCOBUTIN) 150 MG capsule Take 2 capsules (300 mg total) by mouth daily. 60 capsule 11  ? sulfamethoxazole-trimethoprim (BACTRIM) 400-80 MG tablet Take 1 tablet by mouth daily. 30 tablet 11  ? fluticasone (FLONASE) 50 MCG/ACT nasal spray Place 1 spray into both nostrils as needed for allergies or rhinitis. (Patient not taking: Reported on 15/94/5859) 16 g 2  ? folic acid (FOLVITE) 1 MG tablet Take 1 tablet (1 mg total) by mouth daily. (Patient not taking: Reported on 05/10/2021) 90 tablet 0  ? mirtazapine (REMERON) 15 MG tablet Take 1 tablet by mouth at  bedtime. (Patient not taking: Reported on 05/10/2021) 30 tablet 1  ? ondansetron (ZOFRAN-ODT) 4 MG disintegrating tablet Take 1 tablet (4 mg total) by mouth every 8 (eight) hours as needed for nausea or vomiting. (Patient not taking: Reported on 05/10/2021) 10 tablet 0  ? pantoprazole (PROTONIX) 20 MG tablet Take 1 tablet (20 mg total) by mouth daily. (Patient not taking: Reported on 05/10/2021) 30 tablet 0  ? ? ?Prior to Admission medications   ?Medication Sig Start Date End Date Taking? Authorizing Provider  ?acetaminophen (TYLENOL) 500 MG  tablet Take 500 mg by mouth every 6 (six) hours as needed for moderate pain or headache.   Yes [provider]  ?azithromycin (ZITHROMAX) 500 MG tablet Take 1 tablet (500 mg total) by mouth daily. 04/04/21 03/30/22 Yes Vu, Rockey Situ, MD  ?dolutegravir (TIVICAY) 50 MG tablet Take 1 tablet (50 mg total) by mouth daily. 04/04/21  Yes Vu, Rockey Situ, MD  ?emtricitabine-tenofovir (TRUVADA) 200-300 MG tablet Take 1 tablet by mouth daily. 04/04/21  Yes Vu, Rockey Situ, MD  ?ethambutol (MYAMBUTOL) 400 MG tablet Take 2.5 tablets (1,000 mg total) by mouth daily. 04/04/21 03/30/22 Yes Vu, Rockey Situ, MD  ?fluconazole (DIFLUCAN) 200 MG tablet Take 1 tablet (200 mg total) by mouth daily. 04/04/21 03/30/22 Yes Vu, Rockey Situ, MD  ?oxyCODONE (OXY IR/ROXICODONE) 5 MG immediate release tablet Take 1-2 tablets (5-10 mg total) by mouth every 6 (six) hours as needed for severe pain. 04/05/21  Yes Lincoln Brigham, PA-C  ?rifabutin (MYCOBUTIN) 150 MG capsule Take 2 capsules (300 mg total) by mouth daily. 04/04/21 03/30/22 Yes Vu, Rockey Situ, MD  ?sulfamethoxazole-trimethoprim (BACTRIM) 400-80 MG tablet Take 1 tablet by mouth daily. 04/04/21 03/30/22 Yes Vu, Rockey Situ, MD  ?fluticasone (FLONASE) 50 MCG/ACT nasal spray Place 1 spray into both nostrils as needed for allergies or rhinitis. ?Patient not taking: Reported on 01/23/2021 10/26/20 12/25/20  Donney Dice, DO  ?folic acid (FOLVITE) 1 MG tablet Take 1 tablet (1 mg total) by mouth daily. ?Patient not taking: Reported on 05/10/2021 01/25/21   Mercy Riding, MD  ?mirtazapine (REMERON) 15 MG tablet Take 1 tablet by mouth at bedtime. ?Patient not taking: Reported on 05/10/2021 04/16/21   Esmond Plants, RPH-CPP  ?ondansetron (ZOFRAN-ODT) 4 MG disintegrating tablet Take 1 tablet (4 mg total) by mouth every 8 (eight) hours as needed for nausea or vomiting. ?Patient not taking: Reported on 05/10/2021 03/23/21   Carlisle Cater, PA-C  ?pantoprazole (PROTONIX) 20 MG tablet Take 1 tablet (20 mg total) by mouth daily. ?Patient not  taking: Reported on 05/10/2021 03/23/21   Carlisle Cater, PA-C  ? ? ?Blood pressure 124/89, pulse (!) 102, temperature 98.5 ?F (36.9 ?C), resp. rate 18, height _0  (1.854 m), weight 63.6 kg, SpO2 98 %. ?Physical Exam: ?General: frail male who is laying in bed in NAD ?Heart: regular, rate, and rhythm ?Lungs: Respiratory effort nonlabored on room air ?Abd: soft, NT/ND, +BS, no masses, hernias, or organomegaly ?Skin: warm and dry. No palpable lymph nodes in bilateral axilla or bilateral groin. He does have have a soft/ reducible right inguinal hernia ? ?Results for orders placed or performed during the hospital encounter of 05/09/21 (from the past 48 hour(s))  ?CBC with Differential/Platelet     Status: Abnormal  ? Collection Time: 05/12/21  9:01 AM  ?Result Value Ref Range  ? WBC 5.8 4.0 - 10.5 K/uL  ? RBC 2.24 (L) 4.22 - 5.81 MIL/uL  ? Hemoglobin 7.5 (L) 13.0 - 17.0  g/dL  ? HCT 23.3 (L) 39.0 - 52.0 %  ? MCV 104.0 (H) 80.0 - 100.0 fL  ? MCH 33.5 26.0 - 34.0 pg  ? MCHC 32.2 30.0 - 36.0 g/dL  ? RDW 14.6 11.5 - 15.5 %  ? Platelets 169 150 - 400 K/uL  ? nRBC 0.0 0.0 - 0.2 %  ? Neutrophils Relative % 75 %  ? Neutro Abs 4.4 1.7 - 7.7 K/uL  ? Lymphocytes Relative 8 %  ? Lymphs Abs 0.4 (L) 0.7 - 4.0 K/uL  ? Monocytes Relative 4 %  ? Monocytes Absolute 0.3 0.1 - 1.0 K/uL  ? Eosinophils Relative 0 %  ? Eosinophils Absolute 0.0 0.0 - 0.5 K/uL  ? Basophils Relative 1 %  ? Basophils Absolute 0.0 0.0 - 0.1 K/uL  ? WBC Morphology INCREASED BANDS (>20% BANDS)   ? RBC Morphology MORPHOLOGY UNREMARKABLE   ? Smear Review MORPHOLOGY UNREMARKABLE   ? Immature Granulocytes 12 %  ? Abs Immature Granulocytes 0.71 (H) 0.00 - 0.07 K/uL  ?  Comment: Performed at [facility], [place] [address] [place], [place] [address]  ?C-reactive protein     Status: Abnormal  ? Collection Time: 05/12/21  9:01 AM  ?Result Value Ref Range  ? CRP 11.1 (H) <1.0 mg/dL  ?  Comment: Performed at [facility], [place] [address], [place], [place] [address]   ?D-dimer, quantitative     Status: Abnormal  ? Collection Time: 05/12/21  9:01 AM  ?Result Value Ref Range  ? D-Dimer, Quant 2.54 (H) 0.00 - 0.50 ug/mL-FEU  ?  Comment: (NOTE) ?At the manufacturer cut-off valu

## 2021-05-13 NOTE — Telephone Encounter (Signed)
[facility] Patient Advocate Encounter ? ?Patient's medications have been couriered to [facility] from [facility] and will be picked up 05/21/21. ? ?Ileene Patrick , CPhT ?Specialty Pharmacy Patient Advocate ?[facility] ?Phone: [phone] ?Fax:  [phone]  ?

## 2021-05-14 ENCOUNTER — Ambulatory Visit: Payer: Medicaid Other | Admitting: Registered Nurse

## 2021-05-14 DIAGNOSIS — A312 Disseminated mycobacterium avium-intracellulare complex (DMAC): Secondary | ICD-10-CM | POA: Diagnosis not present

## 2021-05-14 LAB — COMPREHENSIVE METABOLIC PANEL
ALT: 26 U/L (ref 0–44)
AST: 49 U/L — ABNORMAL HIGH (ref 15–41)
Albumin: <1.5 g/dL — ABNORMAL LOW (ref 3.5–5.0)
Alkaline Phosphatase: 116 U/L (ref 38–126)
Anion gap: 3 — ABNORMAL LOW (ref 5–15)
BUN: 12 mg/dL (ref 8–23)
CO2: 21 mmol/L — ABNORMAL LOW (ref 22–32)
Calcium: 7.4 mg/dL — ABNORMAL LOW (ref 8.9–10.3)
Chloride: 108 mmol/L (ref 98–111)
Creatinine, Ser: 0.9 mg/dL (ref 0.61–1.24)
GFR, Estimated: >60 mL/min (ref >60)
Glucose, Bld: 98 mg/dL (ref 70–99)
Potassium: 4 mmol/L (ref 3.5–5.1)
Sodium: 132 mmol/L — ABNORMAL LOW (ref 135–145)
Total Bilirubin: 1.3 mg/dL — ABNORMAL HIGH (ref 0.3–1.2)
Total Protein: 5.7 g/dL — ABNORMAL LOW (ref 6.5–8.1)

## 2021-05-14 LAB — HEMOGLOBIN AND HEMATOCRIT, BLOOD
HCT: 20.6 % — ABNORMAL LOW (ref 39.0–52.0)
Hemoglobin: 6.7 g/dL — CL (ref 13.0–17.0)

## 2021-05-14 LAB — CBC
HCT: 19.9 % — ABNORMAL LOW (ref 39.0–52.0)
Hemoglobin: 6.7 g/dL — CL (ref 13.0–17.0)
MCH: 34.4 pg — ABNORMAL HIGH (ref 26.0–34.0)
MCHC: 33.7 g/dL (ref 30.0–36.0)
MCV: 102.1 fL — ABNORMAL HIGH (ref 80.0–100.0)
Platelets: 154 10*3/uL (ref 150–400)
RBC: 1.95 MIL/uL — ABNORMAL LOW (ref 4.22–5.81)
RDW: 14.6 % (ref 11.5–15.5)
WBC: 4.3 10*3/uL (ref 4.0–10.5)
nRBC: 0 % (ref 0.0–0.2)

## 2021-05-14 LAB — FERRITIN: Ferritin: 1153 ng/mL — ABNORMAL HIGH (ref 24–336)

## 2021-05-14 LAB — D-DIMER, QUANTITATIVE: D-Dimer, Quant: 2.25 ug/mL-FEU — ABNORMAL HIGH (ref 0.00–0.50)

## 2021-05-14 LAB — ABO/RH: ABO/RH(D): O POS

## 2021-05-14 LAB — PREPARE RBC (CROSSMATCH)

## 2021-05-14 LAB — C-REACTIVE PROTEIN: CRP: 12.3 mg/dL — ABNORMAL HIGH (ref <1.0)

## 2021-05-14 MED ORDER — SODIUM CHLORIDE 0.9% IV SOLUTION: Freq: Once | INTRAVENOUS | Status: AC

## 2021-05-14 NOTE — Progress Notes (Signed)
[facility]) Hospital Liaison note: ? ?This is a pending outpatient-based Palliative Care patient. Will continue to follow for disposition. ? ?Please call with any outpatient palliative questions or concerns. ? ?Thank you, ?Lorelee Market, LPN ?[facility] Hospital Liaison ?[phone] ?

## 2021-05-14 NOTE — Progress Notes (Addendum)
?PROGRESS NOTE ? ? ? ?Ricky [name]  WGY:[id number] DOB: 27-Jul-1955 DOA: 05/09/2021 ?PCP: Pcp, No ? ? 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 ? ?Assessment & Plan: ?  ?Principal Problem: ?  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  Nocardia infection ?  COVID-19 ?  Hyponatremia ?  Cryptococcal meningitis ([place]) ?  AIDS (acquired immune deficiency syndrome) ([place]) ?  GERD without esophagitis ?  Protein-calorie malnutrition, severe ? 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 ?-ID following - appreciate insight/recs ?-Azithromycin, Ethambutol, Rifabutin daily ?-Adenopathy noted on PET from 3/30 ?-ID rec'd excisional biopsy by gen surgery -upon evaluation today and further discussion with general surgery appears to most if not all of patient's reactive adenopathy on PET scan are deep and will require an invasive procedure to obtain excisional biopsy which would likely not be tolerated by the patient given his current status. ?-Pending discussion with pulmonology may be able to tolerate bronchoscopy for carinal or paratracheal biopsy for definitive diagnosis of lymphadenopathy to rule out lymphoma versus reactive lymphadenopathy ** UPDATE - after discussion with oncology, pulmonology, ID today will need an excisional nodal biopsy which would likely need to be done by cardiothoracic surgery.  CT surgery has been consulted(discussed with Dr. Kerby Less), their team will follow in  the next 24 to 48 hours for further recommendations. ? ?Goals of care ?-Lengthy discussion over the phone today with patient's brother [name]. ?-Patient has multiple siblings involved and no one has POA ?-Discussed at length the patient's poor prognosis given profoundly uncontrolled HIV, multiple infections, sepsis criteria COVID-positive status and concern for possible neoplastic process as noted on PET scan. ?-We discussed that if patient were to have confirmation of neoplastic disease he would likely be unable to tolerate chemo or radiation at this point and as such will not follow at this time for excisional biopsy. ?-Certainly if patient's status continues to improve while controlling his multiple infections and HIV it would be worth further discussion on moving forward for excisional biopsy once a safe location and biopsy site is located. ? ?Symptomatic anemia, POA, worsening ?-Likely secondary to acute production issues in the setting of profound infection ?-Fecal occult pending to help rule out possible GI bleed - no signs or symptoms of bleeding at this point. ? ?Cryptococcal meningitis ([place]) ?-Continue fluconazole per ID ?  ?AIDS (acquired immune deficiency syndrome) ([place]) ?-Profoundly uncontrolled in the setting of medication noncompliance ?-ID following, continue Truvada, Tivicay, Bactrim ?Lab Results  ?Component Value Date  ? HIV1RNAQUANT 111 (H) 05/07/2021  ? ?Nocardia infection ?-Holding on treatment per ID given above ?  ?Hyponatremia ?-Monitor BMP ?  ?Covid 19 positive status  ?-Unclear if active/acute infection vs incidental/prolonged positivity due to immunocompromised state ?-Molnupiravir - last dose 05/14/2021 ? ?GERD without esophagitis ?Continue Pepcid, Protonix  ? ?Severe protein-calorie malnutrition ?Continue calorie supplementation with increased p.o. intake and ensure as tolerated ?  ?DVT prophylaxis: Lovenox ?Code Status: Full ?Family Communication: Lengthy discussion over the phone with  Sister [name] ? ?Status is: 11mn ? ?Dispo: The patient is from: Home ?  Anticipated d/c is to: To be determined ?             Anticipated d/c date is: 72+ hours ?             Patient currently not medically stable for discharge ? ?Consultants:  ?General surgery, infectious disease ? ?Procedures:  ?None ? ?Antimicrobials:  ?Azithromycin, fluconazole, molnupiravir, Bactrim, ethambutol ? ?Subjective: ?No acute issues or events noted overnight, patient somnolent, review of systems markedly limited due to mental status currently. ? ?Objective: ?Vitals:  ? 05/12/21 2051 05/13/21 6812 05/13/21 1717 05/13/21 1930  ?BP: (!) 124/94 124/89 125/81 121/88  ?Pulse: 100 (!) 102 (!) 101 (!) 105  ?Resp: _0 ?Temp: 98.3 ?F (36.8 ?C) 98.5 ?F (36.9 ?C) 98.4 ?F (36.9 ?C) 98.8 ?F (37.1 ?C)  ?TempSrc: Oral   Oral  ?SpO2: 99% 98% 98% 100%  ?Weight:      ?Height:      ? ?Intake/Output Summary (Last 24 hours) at 05/14/2021 0716 ?Last data filed at 05/13/2021 2200 ?Gross per 24 hour  ?Intake 1108 ml  ?Output 200 ml  ?Net 908 ml  ? ? ?Filed Weights  ? 05/10/21 0003  ?Weight: 63.6 kg  ? ? ?Examination: ?General: Somnolent, no acute distress. ?Lungs: Bilateral rhonchi without overt wheeze or rales. ?Heart:  Regular rate and rhythm.  Without murmurs, rubs, or gallops. ?Abdomen:  Soft, nontender, nondistended.  Without guarding or rebound. ?Extremities: Without cyanosis, clubbing, edema, or obvious deformity. ? ?Data Reviewed: I have personally reviewed following labs and imaging studies ? ?CBC: ?Recent Labs  ?Lab 05/09/21 ?2251 05/10/21 ?0251 05/11/21 ?7517 05/12/21 ?0901 05/13/21 ?0017 05/14/21 ?0413  ?WBC 4.5 4.2 4.6 5.8 5.5 4.3  ?NEUTROABS 3.3  --  3.6 4.4 4.3  --   ?HGB 7.1* 7.1* 7.3* 7.5* 7.2* 6.7*  ?HCT 21.0* 21.1* 21.7* 23.3* 22.4* 19.9*  ?MCV 100.0 99.5 101.4* 104.0* 103.7* 102.1*  ?PLT 164 141* 122* 169 144* 154  ? ? ?Basic Metabolic Panel: ?Recent Labs  ?Lab 05/09/21 ?2251 05/10/21 ?0251 05/12/21 ?0901 05/13/21 ?4944  05/14/21 ?0413  ?NA 125* 127* 130* 129* 132*  ?K 4.3 4.2 3.7 4.1 4.0  ?CL 97* 100 103 105 108  ?CO2 _1 21*  ?GLUCOSE 74 60* 72 84 98  ?BUN _2 ?CREATININE 1.17 1.07 0.98 0.98 0.90  ?CALCIUM 7.6* 7.5* 7.2* 7.6* 7.4*  ?MG 1.8  --   --   --   --   ? ? ?GFR: ?Estimated Creatinine Clearance: 75.6 mL/min (by C-G formula based on SCr of 0.9 mg/dL). ?Liver Function Tests: ?Recent Labs  ?Lab 05/09/21 ?2251 05/10/21 ?0251 05/12/21 ?0901 05/13/21 ?9675 05/14/21 ?0413  ?AST 65* 59* 55* 54* 49*  ?ALT 37 35 _3 ?ALKPHOS 184* 168* 138* 126 116  ?BILITOT 1.8* 1.7* 1.8* 1.8* 1.3*  ?PROT 6.1* 5.7* 6.0* 6.1* 5.7*  ?ALBUMIN <1.5* <1.5* <1.5* <1.5* <1.5*  ? ? ?No results for input(s): LIPASE, AMYLASE in the last 168 hours. ?No results for input(s): AMMONIA in the last 168 hours. ?Coagulation Profile: ?Recent Labs  ?Lab 05/09/21 ?2251  ?INR 1.4*  ? ? ?Cardiac Enzymes: ?No results for input(s): CKTOTAL, CKMB, CKMBINDEX, TROPONINI in the last 168 hours. ?BNP (last 3 results) ?No results for input(s): PROBNP in the last 8760 hours. ?HbA1C: ?No results for input(s): HGBA1C in the last 72 hours. ?CBG: ?No results for input(s): GLUCAP in the last 168 hours. ?Lipid Profile: ?No results for  input(s): CHOL, HDL, LDLCALC, TRIG, CHOLHDL, LDLDIRECT in the last 72 hours. ?Thyroid Function Tests: ?No results for input(s): TSH, T4TOTAL, FREET4, T3FREE, THYROIDAB in the last 72 hours. ?Anemia Panel: ?Recent Labs  ?  05/13/21 ?7793 05/14/21 ?0413  ?FERRITIN 1,288* 1,153*  ? ? ?Sepsis Labs: ?Recent Labs  ?Lab 05/10/21 ?0727  ?PROCALCITON 1.62  ? ? ? ?Recent Results (from the past 240 hour(s))  ?Resp Panel by RT-PCR (Flu A&B, Covid) Nasopharyngeal Swab     Status: Abnormal  ? Collection Time: 05/09/21 11:24 PM  ? Specimen: Nasopharyngeal Swab; Nasopharyngeal(NP) swabs in vial transport medium  ?Result Value Ref Range Status  ? SARS Coronavirus 2 by RT PCR POSITIVE (A) NEGATIVE Final  ?  Comment: (NOTE) ?SARS-CoV-2 target nucleic  acids are DETECTED. ? ?The SARS-CoV-2 RNA is generally detectable in upper respiratory ?specimens during the acute phase of infection. Positive results are ?indicative of the presence of the identified vi

## 2021-05-14 NOTE — Progress Notes (Signed)
?  Subjective: ?Patient discussed with Dr.Comer regarding coordination of care and consideration of excisional- biopsy of mediastinal adenopathy.  I have reviewed the most recent CT chest images.  Patient does have some mild subcarinal mediastinal adenopathy but are not large enough to reliably access with VATS to allow surgical excisional biopsy -also the patient has severe comorbidities and would be a very high risk surgical candidate for a probable low yield procedure. ? ?Objective: ?Vital signs in last 24 hours: ?Temp:  [98.4 ?F (36.9 ?C)-98.8 ?F (37.1 ?C)] 98.7 ?F (37.1 ?C) (04/11 0900) ?Pulse Rate:  [101-105] 101 (04/11 0900) ?Resp:  [17-18] 17 (04/11 0900) ?BP: (121-125)/(81-88) 121/83 (04/11 0900) ?SpO2:  [98 %-100 %] 99 % (04/11 0900) ? ?Hemodynamic parameters for last 24 hours: ?  ? ?Intake/Output from previous day: ?04/10 0701 - 04/11 0700 ?In: 1108 [P.O.:120; I.V.:988] ?Out: 200 [Urine:200] ?Intake/Output this shift: ?Total I/O ?In: 320 [P.O.:320] ?Out: -  ? ? ? ?Lab Results: ?Recent Labs  ?  05/13/21 ?0867 05/14/21 ?0413 05/14/21 ?6195  ?WBC 5.5 4.3  --   ?HGB 7.2* 6.7* 6.7*  ?HCT 22.4* 19.9* 20.6*  ?PLT 144* 154  --   ? ?BMET:  ?Recent Labs  ?  05/13/21 ?0932 05/14/21 ?0413  ?NA 129* 132*  ?K 4.1 4.0  ?CL 105 108  ?CO2 22 21*  ?GLUCOSE 84 98  ?BUN 11 12  ?CREATININE 0.98 0.90  ?CALCIUM 7.6* 7.4*  ?  ?PT/INR: No results for input(s): LABPROT, INR in the last 72 hours. ?ABG ?   ?Component Value Date/Time  ? HCO3 23.3 01/23/2021 0631  ? ACIDBASEDEF 0.3 01/23/2021 0631  ? O2SAT 93.0 01/23/2021 0631  ? ?CBG (last 3)  ?No results for input(s): GLUCAP in the last 72 hours. ? ?Assessment/Plan: ?S/P  ?Patient not a candidate for VATS/excisional biopsy of mediastinal adenopathy. ? ? LOS: 5 days  ? ? ?Dahlia Byes ?05/14/2021 ?  ?

## 2021-05-14 NOTE — Plan of Care (Signed)
Progressing PRBCs today ?

## 2021-05-14 NOTE — Progress Notes (Signed)
?   ? ? ? ? ?[facility] ? ?Date of Admission:  05/09/2021    ? ?Principal Problem: ?  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  Cryptococcal meningitis ([place]) ?  AIDS (acquired immune deficiency syndrome) ([place]) ?  COVID-19 ?  Nocardia infection ?  Hyponatremia ?  GERD without esophagitis ?  Protein-calorie malnutrition, severe ?     ?    ?Assessment: ?41 YM with Disseminated MAC(+ Blood Cx on 12/22 S macrolide) on three drug regimen, HIV/AIDS VL 111 on 05/07/21, Cd4 <35 on 03/26/21, seen by Heme-onc for possible lymphoproliferative disorder SP core Bx+ nocardia, direct admitted from ID clinic for management of nocardia. ?  ?#Disseminated MAC ?-Spoke with Dr, Picklesimer, Pathology and requested case  reviewed as pt has history of MAC. Amendment made that organism is consistent with Nocardia to MAC.  ?-General surgery consulted and no plans for excisional Bx as no superficial inguinal/axillary LN enlarged(excisional Bx of periaortic nodes deemed high risk) ?-MRI head no evidence of CNS infection ?Recommendations:  ?-Agree with oncology engagement for lymphoma work-up ?-Continue MAC therapy 3 drug regimen rifabutin, azithromycin and ethambutol(started 04/04/21) ?-Follow blood Cx from 4/7 ?-Called pt's brother [name] to discucss care, no answer.  ?  ?#HIV-AIDS ?-CD4 ?-Continue Truvada and Tivicay ?-bactrim for PJP PPX ?  ?#Hx of Cryptococcal meningitis ?-Continue fluconazole for daily maintenance ?  ?  ?#Chronic COVID ?-Monitor for now ?-CT 20.4(44 cutoff). On malnupiravir x 6d ?-Pt has no respiratory symptoms ?  ?  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  ?#Substance abuse ?-Reports he recently used drugs, denies recent IVDA. He did not wish to offer any further information.  ? ?Microbiology:   ?Antibiotics: ?MAC therapy ?HIV ?Truvada and Tivicay ?Bactrim due to PPX ?  ? ?SUBJECTIVE: ?Resting in bed. Reports he does not want to be "annoyed".  ? ?Review of  Systems: ?Review of Systems  ?All other systems reviewed and are negative. ? ? ?Scheduled Meds: ? sodium chloride   Intravenous Once  ? vitamin C  500 mg Oral Daily  ? azithromycin  500 mg Oral Daily  ? dolutegravir  50 mg Oral Daily  ? emtricitabine-tenofovir  1 tablet Oral Daily  ? enoxaparin (LOVENOX) injection  40 mg Subcutaneous Q24H  ? ethambutol  1,000 mg Oral Daily  ? famotidine  20 mg Oral BID  ? feeding supplement  237 mL Oral TID BM  ? fluconazole  200 mg Oral Daily  ? folic acid  1 mg Oral Daily  ? guaiFENesin  600 mg Oral BID  ? mirtazapine  15 mg Oral QHS  ? molnupiravir EUA  4 capsule Oral BID  ? pantoprazole  20 mg Oral Daily  ? rifabutin  300 mg Oral Daily  ? sulfamethoxazole-trimethoprim  1 tablet Oral Daily  ? zinc sulfate  220 mg Oral Daily  ? ?Continuous Infusions: ? sodium chloride 100 mL/hr at 05/10/21 1041  ? ?PRN Meds:.acetaminophen **OR** acetaminophen, chlorpheniramine-HYDROcodone, guaiFENesin-dextromethorphan, labetalol, magnesium hydroxide, ondansetron **OR** ondansetron (ZOFRAN) IV, oxyCODONE ?Allergies  ?Allergen Reactions  ? Bactrim [Sulfamethoxazole-Trimethoprim] Other (See Comments)  ?  Patient was trialed on DS TIW and SS daily for PJP prophylaxis and developed hyperkalemia and increased Scr (pt is currently taking bactrim per infectious disease notes 05/07/21)  ? ? ?OBJECTIVE: ?Vitals:  ? 05/13/21 0923 05/13/21 1717 05/13/21 1930 05/14/21 0900  ?BP: 124/89 125/81 121/88 121/83  ?Pulse: (!) 102 (!) 101 (!) 105 (!) 101  ?Resp: 18 17  18 17  ?Temp: 98.5 ?F (36.9 ?C) 98.4 ?F (36.9 ?C) 98.8 ?F (37.1 ?C) 98.7 ?F (37.1 ?C)  ?TempSrc:   Oral   ?SpO2: 98% 98% 100% 99%  ?Weight:      ?Height:      ? ?Body mass index is 18.5 kg/m?. ? ?Physical Exam ?Constitutional:   ?   General: He is not in acute distress. ?   Appearance: He is not toxic-appearing.  ?   Comments: Temporal wasting  ?HENT:  ?   Head: Normocephalic and atraumatic.  ?   Right Ear: External ear normal.  ?   Left Ear: External ear  normal.  ?   Nose: No congestion or rhinorrhea.  ?   Mouth/Throat:  ?   Mouth: Mucous membranes are moist.  ?   Pharynx: Oropharynx is clear.  ?Eyes:  ?   Extraocular Movements: Extraocular movements intact.  ?   Conjunctiva/sclera: Conjunctivae normal.  ?   Pupils: Pupils are equal, round, and reactive to light.  ?Cardiovascular:  ?   Rate and Rhythm: Normal rate and regular rhythm.  ?   Heart sounds: No murmur heard. ?  No friction rub. No gallop.  ?Pulmonary:  ?   Effort: Pulmonary effort is normal.  ?   Breath sounds: Normal breath sounds.  ?Abdominal:  ?   General: Abdomen is flat. Bowel sounds are normal.  ?   Palpations: Abdomen is soft.  ?Musculoskeletal:     ?   General: No swelling. Normal range of motion.  ?   Cervical back: Normal range of motion and neck supple.  ?Skin: ?   General: Skin is warm and dry.  ?Neurological:  ?   General: No focal deficit present.  ?   Mental Status: He is oriented to person, place, and time.  ?Psychiatric:     ?   Mood and Affect: Mood normal.  ? ? ? ? ?Lab Results ?Lab Results  ?Component Value Date  ? WBC 4.3 05/14/2021  ? HGB 6.7 (LL) 05/14/2021  ? HCT 20.6 (L) 05/14/2021  ? MCV 102.1 (H) 05/14/2021  ? PLT 154 05/14/2021  ?  ?Lab Results  ?Component Value Date  ? CREATININE 0.90 05/14/2021  ? BUN 12 05/14/2021  ? NA 132 (L) 05/14/2021  ? K 4.0 05/14/2021  ? CL 108 05/14/2021  ? CO2 21 (L) 05/14/2021  ?  ?Lab Results  ?Component Value Date  ? ALT 26 05/14/2021  ? AST 49 (H) 05/14/2021  ? ALKPHOS 116 05/14/2021  ? BILITOT 1.3 (H) 05/14/2021  ?  ? ? ? ? ?Laurice Record, MD ?[facility] ?[facility] ?05/14/2021, 11:05 AM  ?

## 2021-05-15 DIAGNOSIS — D696 Thrombocytopenia, unspecified: Secondary | ICD-10-CM

## 2021-05-15 DIAGNOSIS — A312 Disseminated mycobacterium avium-intracellulare complex (DMAC): Secondary | ICD-10-CM | POA: Diagnosis not present

## 2021-05-15 LAB — COMPREHENSIVE METABOLIC PANEL
ALT: 25 U/L (ref 0–44)
AST: 56 U/L — ABNORMAL HIGH (ref 15–41)
Albumin: <1.5 g/dL — ABNORMAL LOW (ref 3.5–5.0)
Alkaline Phosphatase: 119 U/L (ref 38–126)
Anion gap: 4 — ABNORMAL LOW (ref 5–15)
BUN: 11 mg/dL (ref 8–23)
CO2: 21 mmol/L — ABNORMAL LOW (ref 22–32)
Calcium: 7.5 mg/dL — ABNORMAL LOW (ref 8.9–10.3)
Chloride: 107 mmol/L (ref 98–111)
Creatinine, Ser: 0.96 mg/dL (ref 0.61–1.24)
GFR, Estimated: >60 mL/min (ref >60)
Glucose, Bld: 87 mg/dL (ref 70–99)
Potassium: 3.9 mmol/L (ref 3.5–5.1)
Sodium: 132 mmol/L — ABNORMAL LOW (ref 135–145)
Total Bilirubin: 2 mg/dL — ABNORMAL HIGH (ref 0.3–1.2)
Total Protein: 6 g/dL — ABNORMAL LOW (ref 6.5–8.1)

## 2021-05-15 LAB — CBC
HCT: 24.3 % — ABNORMAL LOW (ref 39.0–52.0)
Hemoglobin: 8.3 g/dL — ABNORMAL LOW (ref 13.0–17.0)
MCH: 33.6 pg (ref 26.0–34.0)
MCHC: 34.2 g/dL (ref 30.0–36.0)
MCV: 98.4 fL (ref 80.0–100.0)
Platelets: 131 10*3/uL — ABNORMAL LOW (ref 150–400)
RBC: 2.47 MIL/uL — ABNORMAL LOW (ref 4.22–5.81)
RDW: 16.4 % — ABNORMAL HIGH (ref 11.5–15.5)
WBC: 3.9 10*3/uL — ABNORMAL LOW (ref 4.0–10.5)
nRBC: 0 % (ref 0.0–0.2)

## 2021-05-15 LAB — TYPE AND SCREEN
ABO/RH(D): O POS
Antibody Screen: NEGATIVE
Unit division: 0

## 2021-05-15 LAB — CULTURE, BLOOD (ROUTINE X 2)
Culture: NO GROWTH
Culture: NO GROWTH
Special Requests: ADEQUATE

## 2021-05-15 LAB — BPAM RBC
Blood Product Expiration Date: 202305082359
ISSUE DATE / TIME: 202304111724
Unit Type and Rh: 5100

## 2021-05-15 LAB — C-REACTIVE PROTEIN: CRP: 11.6 mg/dL — ABNORMAL HIGH (ref <1.0)

## 2021-05-15 LAB — FERRITIN: Ferritin: 1327 ng/mL — ABNORMAL HIGH (ref 24–336)

## 2021-05-15 LAB — D-DIMER, QUANTITATIVE: D-Dimer, Quant: 2.83 ug/mL-FEU — ABNORMAL HIGH (ref 0.00–0.50)

## 2021-05-15 NOTE — Hospital Course (Signed)
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 ?Patient was admitted for further work-up being treated for sepsis due to MAI disseminated infection, bulky adenopathy concerning for lymphoproliferative cancer, severe symptomatic anemia likely from acute illness profound infection, along with a comfortable meningitis ARDS Nocardia infection hyponatremia COVID-19 positive status, GERD. ? ?Per report -Noncompliant with abx/HIV meds for weeks(months?) --> ID following on abx - abnormal lymphadenopathy - outpt PET scan abnormal - trying to coordinate biopsy with ID>Sx>Pulm>Oncology --> basically down to CT Sx at this point for mediastinal node biopsy and they feels he is not a candidate for  VATS/ Exciisional biopsy of metastatic adenopathy.  ?

## 2021-05-15 NOTE — TOC Progression Note (Addendum)
Transition of Care (TOC) - Initial/Assessment Note  ? ? ?Patient Details  ?Name: Ricky [name] ?MRN: [id number] ?Date of Birth: Sep 25, 1955 ? ?Transition of Care (TOC) CM/SW Contact:    ?Paulene Floor Khoa Opdahl, LCSWA ?Phone Number: ?05/15/2021, 3:02 PM ? ?Clinical Narrative:                 ?CSW received consult for SNF placement.  PT and OT assessments and recommendations are needed prior to the SNF process beginning.   ? ?TOC following patient for any d/c planning needs once medically stable. ? ? ? ?Lind Covert, MSW, LCSWA  ? ?  ?  ? ? ?Patient Goals and CMS Choice ?  ?  ?  ? ?Expected Discharge Plan and Services ?  ?  ?  ?  ?  ?                ?  ?  ?  ?  ?  ?  ?  ?  ?  ?  ? ?Prior Living Arrangements/Services ?  ?  ?  ?       ?  ?  ?  ?  ? ?Activities of Daily Living ?Home Assistive Devices/Equipment: Gilford Rile (specify type) ?ADL Screening (condition at time of admission) ?Patient's cognitive ability adequate to safely complete daily activities?: Yes ?Is the patient deaf or have difficulty hearing?: No ?Does the patient have difficulty seeing, even when wearing glasses/contacts?: No ?Does the patient have difficulty concentrating, remembering, or making decisions?: No ?Patient able to express need for assistance with ADLs?: Yes ?Does the patient have difficulty dressing or bathing?: No ?Independently performs ADLs?: Yes (appropriate for developmental age) ?Does the patient have difficulty walking or climbing stairs?: Yes ?Weakness of Legs: Both ?Weakness of Arms/Hands: Both ? ?Permission Sought/Granted ?  ?  ?   ?   ?   ?   ? ?Emotional Assessment ?  ?  ?  ?  ?  ?  ? ?Admission diagnosis:  Nocardia infection [A43.9] ?Patient Active Problem List  ? Diagnosis Date Noted  ? Thrombocytopenia ([place]) 05/15/2021  ? Hyponatremia 05/10/2021  ? GERD without esophagitis 05/10/2021  ? Protein-calorie malnutrition, severe 05/10/2021  ? 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 Bladder wall thickening 04/08/2021  ? Abdominal pain 04/08/2021  ?  Normocytic anemia 04/08/2021  ? Nausea with vomiting 04/08/2021  ? Fever   ? Acute cholecystitis 01/22/2021  ? Complicated UTI (urinary tract infection) 01/22/2021  ? Noncompliance 01/22/2021  ? Lymphadenopathy   ? Chronic low back pain   ? HIV infection ([place])   ? Community acquired pneumonia   ? Nonadherence to medical treatment   ? COVID-19 virus infection 10/13/2020  ? Alcohol abuse, daily use   ? COVID-19 10/12/2020  ? 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 Blurry vision, left eye   ? AKI (acute kidney injury) ([place])   ? Cavitary lesion of lung   ? Acute nonintractable headache   ? Smoking   ? Cocaine use   ? Paroxysmal SVT (supraventricular tachycardia) ([facility])   ? Abnormal EKG   ? Atrial flutter ([place]) 03/27/2020  ? Aortic atherosclerosis ([place]) 03/27/2020  ? Hypertension 03/27/2020  ? Cryptococcal meningitis ([place]) 03/26/2020  ? Leukocytopenia 03/26/2020  ? Macrocytosis 03/26/2020  ? Moderate protein malnutrition ([place]) 03/26/2020  ? Hypoalbuminemia 03/26/2020  ? AIDS (acquired immune deficiency syndrome) ([place]) 03/26/2020  ? ?PCP:  Pcp, No ?Pharmacy:   ?[id number] [place] [place] - [address] ?[place] ?[place] [address] ?Phone: [phone] Fax: [phone] ? ?[facility] ?[address] ?[place] [address] ?Phone: [phone] Fax: [phone] ? ? ? ? ?Social Determinants of Health (SDOH) Interventions ?  ? ?Readmission Risk Interventions ? ?  10/26/2020  ? 12:55 PM 04/25/2020  ? 10:22 AM  ?Readmission Risk Prevention Plan  ?Transportation Screening Complete Complete  ?PCP or Specialist Appt within 5-7 Days Complete   ?PCP or Specialist Appt within 3-5 Days  Complete  ?Home Care Screening Complete   ?[place] or Home Care Consult  Complete  ?Social Work Consult for [place] Planning/Counseling  Complete  ?Palliative Care Screening  Not Applicable  ?Medication Review Press photographer)   Complete  ? ? ? ?

## 2021-05-15 NOTE — Progress Notes (Signed)
?   ? ? ? ? ?[facility] ? ?Date of Admission:  05/09/2021    ? ?Principal Problem: ?  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  Cryptococcal meningitis ([place]) ?  AIDS (acquired immune deficiency syndrome) ([place]) ?  COVID-19 ?  Nocardia infection ?  Hyponatremia ?  GERD without esophagitis ?  Protein-calorie malnutrition, severe ?     ?    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 ?  ?#Disseminated MAC ?-Spoke with Dr, Picklesimer, Pathology and requested case  reviewed as pt has history of MAC. Amendment made that organism is consistent with Nocardia to MAC.  ?-General surgery consulted and no plans for excisional Bx as no superficial inguinal/axillary LN enlarged(excisional Bx of periaortic nodes deemed high risk) ?-CTS engaged for possible biopsy and pt is not a candidate for VATS/excisional mediastinal Bx). No current plans for excisional biospy. Per discussion with pulmonary EBUS with Bx is possible.  ?-No current plans for EBUS as pt had a core needle biopsy. ?-MRI head no evidence of CNS infection ?Recommendations:  ?-Continue MAC therapy 3 drug regimen rifabutin, azithromycin and ethambutol(started 04/04/21) ?-Continue goals of care discussion as no plan for excisional Bx, Lymphoma still on the differential ?-I spoke with the patient today and he stated he he can't take care of himself alone. Engage case management for possible SNF placement.  ?  ?#HIV-AIDS ?-CD4 ?-Continue Truvada and Tivicay ?-bactrim for PJP PPX ?  ?#Hx of Cryptococcal meningitis ?-Continue fluconazole for daily maintenance ?  ?  ?#Chronic COVID ?-Monitor for now ?-CT 20.4(44 cutoff). On malnupiravir x 6d ?-Pt has no respiratory  symptoms ?  ?  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  ?#Substance abuse ?-Reports he recently used drugs, denies recent IVDA. He did not wish to offer any further information.  ? ?Microbiology:   ?Antibiotics: ?MAC therapy ?HIV ?Truvada and Tivicay ?Bactrim due to PPX ?  ? ?SUBJECTIVE: ?Resting in bed. Reports he cant take care of himself alone.  ? ?Review of Systems: ?Review of Systems  ?All other systems reviewed and are negative. ? ? ?Scheduled Meds: ? vitamin C  500 mg Oral Daily  ? azithromycin  500 mg Oral Daily  ? dolutegravir  50 mg Oral Daily  ? emtricitabine-tenofovir  1 tablet Oral Daily  ? enoxaparin (LOVENOX) injection  40 mg Subcutaneous Q24H  ? ethambutol  1,000 mg Oral Daily  ? famotidine  20 mg Oral BID  ? feeding supplement  237 mL Oral TID BM  ? fluconazole  200 mg Oral Daily  ? folic acid  1 mg Oral Daily  ? guaiFENesin  600 mg Oral BID  ? mirtazapine  15 mg Oral QHS  ? pantoprazole  20 mg Oral Daily  ? rifabutin  300 mg Oral Daily  ? sulfamethoxazole-trimethoprim  1 tablet Oral Daily  ? zinc sulfate  220 mg Oral Daily  ? ?Continuous Infusions: ? sodium chloride 100 mL/hr at 05/10/21 1041  ? ?PRN Meds:.acetaminophen **OR** acetaminophen, chlorpheniramine-HYDROcodone, guaiFENesin-dextromethorphan, labetalol, magnesium hydroxide, ondansetron **OR** ondansetron (ZOFRAN) IV, oxyCODONE ?Allergies  ?Allergen Reactions  ? Bactrim [Sulfamethoxazole-Trimethoprim] Other (See Comments)  ?  Patient was trialed on DS TIW and SS daily for PJP prophylaxis and developed hyperkalemia and increased Scr (  pt is currently taking bactrim per infectious disease notes 05/07/21)  ? ? ?OBJECTIVE: ?Vitals:  ? 05/14/21 1845 05/14/21 2015 05/14/21 2234 05/15/21 0509  ?BP: 120/89 (!) 124/93 (!) 118/92 123/85  ?Pulse: 96 100 96 97  ?Resp: _0 ?Temp: 98.7 ?F (37.1 ?C) 97.9 ?F (36.6 ?C) 98 ?F (36.7 ?C) 98 ?F (36.7 ?C)  ?TempSrc: Axillary Oral  Oral  ?SpO2: 100% 100%  99%  ?Weight:      ?Height:      ? ?Body mass index is 18.5  kg/m?. ? ?Physical Exam ?Constitutional:   ?   General: He is not in acute distress. ?   Appearance: He is not toxic-appearing.  ?   Comments: Temporal wasting  ?HENT:  ?   Head: Normocephalic and atraumatic.  ?   Right Ear: External ear normal.  ?   Left Ear: External ear normal.  ?   Nose: No congestion or rhinorrhea.  ?   Mouth/Throat:  ?   Mouth: Mucous membranes are moist.  ?   Pharynx: Oropharynx is clear.  ?Eyes:  ?   Extraocular Movements: Extraocular movements intact.  ?   Conjunctiva/sclera: Conjunctivae normal.  ?   Pupils: Pupils are equal, round, and reactive to light.  ?Cardiovascular:  ?   Rate and Rhythm: Normal rate and regular rhythm.  ?   Heart sounds: No murmur heard. ?  No friction rub. No gallop.  ?Pulmonary:  ?   Effort: Pulmonary effort is normal.  ?   Breath sounds: Normal breath sounds.  ?Abdominal:  ?   General: Abdomen is flat. Bowel sounds are normal.  ?   Palpations: Abdomen is soft.  ?Musculoskeletal:     ?   General: No swelling. Normal range of motion.  ?   Cervical back: Normal range of motion and neck supple.  ?Skin: ?   General: Skin is warm and dry.  ?Neurological:  ?   General: No focal deficit present.  ?   Mental Status: He is oriented to person, place, and time.  ?Psychiatric:     ?   Mood and Affect: Mood normal.  ? ? ? ? ?Lab Results ?Lab Results  ?Component Value Date  ? WBC 3.9 (L) 05/15/2021  ? HGB 8.3 (L) 05/15/2021  ? HCT 24.3 (L) 05/15/2021  ? MCV 98.4 05/15/2021  ? PLT 131 (L) 05/15/2021  ?  ?Lab Results  ?Component Value Date  ? CREATININE 0.96 05/15/2021  ? BUN 11 05/15/2021  ? NA 132 (L) 05/15/2021  ? K 3.9 05/15/2021  ? CL 107 05/15/2021  ? CO2 21 (L) 05/15/2021  ?  ?Lab Results  ?Component Value Date  ? ALT 25 05/15/2021  ? AST 56 (H) 05/15/2021  ? ALKPHOS 119 05/15/2021  ? BILITOT 2.0 (H) 05/15/2021  ?  ? ? ? ? ?Laurice Record, MD ?[facility] ?[facility] ?05/15/2021, 10:28 AM  ?

## 2021-05-15 NOTE — Progress Notes (Signed)
?PROGRESS NOTE ?ARTH NICASTRO  PYP:[id number] DOB: Jan 10, 1956 DOA: 05/09/2021 ?PCP: Pcp, No  ? 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 ?Patient was admitted for further work-up being treated for sepsis due to MAI disseminated infection, bulky adenopathy concerning for lymphoproliferative cancer, severe symptomatic anemia likely from acute illness profound infection, along with a comfortable meningitis ARDS Nocardia infection hyponatremia COVID-19 positive status, GERD. ? ?Per report -Noncompliant with abx/HIV meds for weeks(months?) --> ID following on abx - abnormal lymphadenopathy - outpt PET scan abnormal - trying to coordinate biopsy with ID>Sx>Pulm>Oncology --> basically down to CT Sx at this point for mediastinal node biopsy and they feels he is not a candidate for  VATS/ Exciisional biopsy of metastatic adenopathy.   ?  ?Subjective: ?Seen and examined this morning.  Resting comfortably appears weak frail does not have much appetite.  His cousins are at the bedside. ? ?Assessment and Plan: ?Principal Problem: ?  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  Nocardia infection ?  COVID-19 ?  Hyponatremia ?  Cryptococcal meningitis ([place]) ?  AIDS (acquired immune deficiency syndrome) ([place]) ?  Leukocytopenia ?  Normocytic anemia ?  GERD without esophagitis ?  Protein-calorie malnutrition, severe ?  Thrombocytopenia ([place]) ? ?Disseminated MAC ?Abnormal lymphadenopathy:  ?Trying to coordinate biopsy with ID>Sx>Pulm>Oncology.  CT surgery increased for possible biopsy but they  felt he is not a candidate for VATS/excisional mediastinal biopsy, no current plan for excisional biopsy, per discussion by ID with pulmonary EBUS with biopsies possible, currently no current plan.  Lymphoma is still on the differential.Continue MAC therapy 3 degrees admitted rifabutin/azithromycin/ethambutol started 3/2. ? ?AIDS-HIV: Continue Truvada Tivicay see Bactrim for PCP prophylaxis as per ID ? 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 ?Chronic COVID:Recurrence of infection is likely secondary to immunosuppression.  CT 20.4-44 is cut off, plan is for molnupiravir x6 D, he has no respiratory symptoms. ? ?Anemia normocytic likely from anemia of chronic disease.  Received 1 unit PRBC 4/11.  Hemoglobin improved. Monitor ?Thrombocytopenia monitor likely from HIV ?Leukopenia mild this morning monitor ?Recent Labs  ?Lab 05/13/21 ?2458 05/14/21 ?0413 05/14/21 ?0998 05/15/21 ?3382  ?HGB 7.2* 6.7* 6.7* 8.3*  ?HCT 22.4* 19.9* 20.6* 24.3*  ?WBC 5.5 4.3  --  3.9*  ?PLT 144* 154  --  131*  ?  ?Hyponatremia: Improving on normal saline, cut down the rate, ENCOURAGE PO ?Recent Labs  ?Lab 05/10/21 ?0251 05/12/21 ?0901 05/13/21 ?5053 05/14/21 ?0413 05/15/21 ?9767  ?NA 127* 130* 129* 132* 132*  ?  ?GERD without esophagitis:continue PPI. ? ?History of substance abuse reported recently use drugs denies IVDU ? ?Severe malnutrition augment diet as below. ?Nutrition Problem: Severe Malnutrition ?Etiology: chronic illness (HIV) ?Signs/Symptoms: severe muscle depletion, severe fat depletion, energy intake < 75% for > or equal to 1 month, percent weight loss ?Percent weight loss: 17.5 % (x2 months) ?Interventions: Ensure Enlive (each supplement provides 350kcal and 20 grams of protein), Liberalize Diet ? ?DVT prophylaxis: enoxaparin (LOVENOX) injection 40 mg Start: 05/10/21 1800 ?Code Status:   Code Status: Full Code ?Family Communication: plan of care discussed with patient/cousins at bedside. ?Patient  status is: inpatient Level of care:  Telemetry Medical  ?Remains inpatient because: Ongoing multiple issues, holding skilled nursing.  Placement TOC consulted ?Patient currently not stable ? ?Dispo: The patient is from: Lives with sister ?           Anticipated disposition: Unable to take care of himself, [facility] consult for placement ? ?Mobility Assessment (last 72 hours)   ? ? Mobility Assessment   ? ? [place] Name 05/14/21 0800 05/13/21 1000 05/12/21 2000  ?  ?  ? Does patient have an order for bedrest or is patient medically unstable No - Continue assessment No - Continue assessment No - Continue assessment    ? What is the highest level of mobility based on the progressive mobility assessment? Level 4 (Walks with assist in room) - Balance while marching in place and cannot step forward and back - Complete Level 4 (Walks with assist in room) - Balance while marching in place and cannot step forward and back - Complete Level 4 (Walks with assist in room) - Balance while marching in place and cannot step forward and back - Complete    ? Is the above level different from baseline mobility prior to current illness? Yes - Recommend PT order Yes - Recommend PT order Yes - Recommend PT order    ? ?  ?  ? ?  ?  ? ?Objective: ?Vitals last 24 hrs: ?Vitals:  ? 05/14/21 1845 05/14/21 2015 05/14/21 2234 05/15/21 0509  ?BP: 120/89 (!) 124/93 (!) 118/92 123/85  ?Pulse: 96 100 96 97  ?Resp: _0 ?Temp: 98.7 ?F (37.1 ?C) 97.9 ?F (36.6 ?C) 98 ?F (36.7 ?C) 98 ?F (36.7 ?C)  ?TempSrc: Axillary Oral  Oral  ?SpO2: 100% 100%  99%  ?Weight:      ?Height:      ? ?Weight change:  ? ?Physical Examination: ?General exam: AA oriented cachectic thin and frail,older than stated age, weak appearing. ?HEENT:Oral mucosa moist, Ear/Nose WNL grossly, dentition normal. ?Respiratory system: bilaterally clear BS, no use of accessory muscle ?Cardiovascular system: S1 & S2 +, No JVD,. ?Gastrointestinal system: Abdomen soft,NT,ND, BS+ ?Nervous  System:Alert, awake, moving extremities and grossly nonfocal ?Extremities: LE edema mild,distal peripheral pulses palpable.  ?Skin: No rashes,no icterus. ?MSK: Normal muscle bulk,tone, power ? ?Medications reviewed:  ?Scheduled Meds: ? vitamin C  500 mg Oral Daily  ? azithromycin  500 mg Oral Daily  ? dolutegravir  50 mg Oral Daily  ? emtricitabine-tenofovir  1 tablet Oral Daily  ? enoxaparin (LOVENOX) injection  40 mg Subcutaneous Q24H  ? ethambutol  1,000 mg Oral Daily  ? famotidine  20 mg Oral BID  ? feeding supplement  237 mL Oral TID BM  ? fluconazole  200 mg Oral Daily  ? folic acid  1 mg Oral Daily  ? guaiFENesin  600 mg Oral BID  ? mirtazapine  15 mg Oral QHS  ? pantoprazole  20 mg Oral Daily  ? rifabutin  300 mg Oral Daily  ? sulfamethoxazole-trimethoprim  1 tablet Oral Daily  ? zinc sulfate  220 mg Oral Daily  ? ?Continuous Infusions: ? sodium chloride 100 mL/hr at 05/15/21 0900  ? ? ?  ?Diet Order   ? ?       ?  Diet regular Room service appropriate? Yes; Fluid consistency: Thin  Diet effective now       ?  ? ?  ?  ? ?  ?  ? ?Nutrition Problem: Severe Malnutrition ?Etiology: chronic illness (HIV) ?Signs/Symptoms: severe  muscle depletion, severe fat depletion, energy intake < 75% for > or equal to 1 month, percent weight loss ?Percent weight loss: 17.5 % (x2 months) ?Interventions: Ensure Enlive (each supplement provides 350kcal and 20 grams of protein), Liberalize Diet ? ? ?Intake/Output Summary (Last 24 hours) at 05/15/2021 1424 ?Last data filed at 05/15/2021 [phone] ?Gross per 24 hour  ?Intake 1960 ml  ?Output --  ?Net 1960 ml  ? ?Net IO Since Admission: 4,039.21 mL [05/15/21 1424]  ?Wt Readings from Last 3 Encounters:  ?05/10/21 63.6 kg  ?05/07/21 63.6 kg  ?05/01/21 63.5 kg  ?  ? ?Unresulted Labs (From admission, onward)  ? ?  Start     Ordered  ? 05/12/21 0744  Comprehensive metabolic panel  Daily,   R     ? 05/12/21 0743  ? 05/12/21 0744  CBC  Daily,   R     ? 05/12/21 0743  ? 05/10/21 0925  Acid Fast  Smear (AFB)  (AFB smear + Culture w reflexed sensitivities panel)  Once,   R       ?See Hyperspace for full Linked Orders Report.  ? 05/10/21 0924  ? 05/10/21 0925  Acid Fast Culture with reflexed sensitivities  (AF

## 2021-05-16 DIAGNOSIS — A312 Disseminated mycobacterium avium-intracellulare complex (DMAC): Secondary | ICD-10-CM | POA: Diagnosis not present

## 2021-05-16 LAB — COMPREHENSIVE METABOLIC PANEL
ALT: 27 U/L (ref 0–44)
AST: 58 U/L — ABNORMAL HIGH (ref 15–41)
Albumin: <1.5 g/dL — ABNORMAL LOW (ref 3.5–5.0)
Alkaline Phosphatase: 127 U/L — ABNORMAL HIGH (ref 38–126)
Anion gap: 2 — ABNORMAL LOW (ref 5–15)
BUN: 11 mg/dL (ref 8–23)
CO2: 20 mmol/L — ABNORMAL LOW (ref 22–32)
Calcium: 7.4 mg/dL — ABNORMAL LOW (ref 8.9–10.3)
Chloride: 112 mmol/L — ABNORMAL HIGH (ref 98–111)
Creatinine, Ser: 0.87 mg/dL (ref 0.61–1.24)
GFR, Estimated: >60 mL/min (ref >60)
Glucose, Bld: 97 mg/dL (ref 70–99)
Potassium: 3.9 mmol/L (ref 3.5–5.1)
Sodium: 134 mmol/L — ABNORMAL LOW (ref 135–145)
Total Bilirubin: 2.6 mg/dL — ABNORMAL HIGH (ref 0.3–1.2)
Total Protein: 5.8 g/dL — ABNORMAL LOW (ref 6.5–8.1)

## 2021-05-16 LAB — CBC
HCT: 24.2 % — ABNORMAL LOW (ref 39.0–52.0)
Hemoglobin: 8.2 g/dL — ABNORMAL LOW (ref 13.0–17.0)
MCH: 33.3 pg (ref 26.0–34.0)
MCHC: 33.9 g/dL (ref 30.0–36.0)
MCV: 98.4 fL (ref 80.0–100.0)
Platelets: 136 10*3/uL — ABNORMAL LOW (ref 150–400)
RBC: 2.46 MIL/uL — ABNORMAL LOW (ref 4.22–5.81)
RDW: 16.1 % — ABNORMAL HIGH (ref 11.5–15.5)
WBC: 3.2 10*3/uL — ABNORMAL LOW (ref 4.0–10.5)
nRBC: 0 % (ref 0.0–0.2)

## 2021-05-16 MED ORDER — MEGESTROL ACETATE 400 MG/10ML PO SUSP
400.0000 mg | Freq: Every day | ORAL | Status: DC
  Administered 2021-05-16 – 2021-05-20 (×4): 400 mg via ORAL
  Filled 2021-05-16 (×6): qty 10

## 2021-05-16 NOTE — Evaluation (Signed)
Physical Therapy Evaluation ?Patient Details ?Name: SERGE MAIN ?MRN: [id number] ?DOB: 12/29/1955 ?Today's Date: 05/16/2021 ? ?History of Present Illness ? 66 y/o male admitted on 05/09/21 after recent lymph node biopsy that shows disseminated nocardia infection. Not a candidate for VATS/excisional biopsy of mediastinal adenopathy. PMH: HIV/AIDS, hx of cryptococcal meningitis, chronic COVID, hx of TB with chronic cavitary lung disease, substance abuse, generalized lymphadenopathy  ?Clinical Impression ? Patient admitted with the above findings. Patient presents with generalized weakness, impaired balance, decreased activity tolerance, poor safety awareness. Patient agitated throughout session but increasingly agitated after attempting to sit EOB with min guard assistance. Educated patient on importance of mobility and consequences of sedentary lifestyle while in hospital, patient not receptive of education. Patient will benefit from skilled PT services during acute stay to address listed deficits. Recommend SNF at this time to maximize functional mobility and safety. Unsure of patient's baseline as patient not willing to provide much information about PLOF or home setup.    ?   ? ?Recommendations for follow up therapy are one component of a multi-disciplinary discharge planning process, led by the attending physician.  Recommendations may be updated based on patient status, additional functional criteria and insurance authorization. ? ?Follow Up Recommendations Skilled nursing-short term rehab (<3 hours/day) ? ?  ?Assistance Recommended at Discharge Frequent or constant Supervision/Assistance  ?Patient can return home with the following ? A lot of help with walking and/or transfers;A lot of help with bathing/dressing/bathroom;Assistance with cooking/housework;Direct supervision/assist for medications management;Direct supervision/assist for financial management;Assist for transportation;Help with stairs or ramp for  entrance ? ?  ?Equipment Recommendations Other (comment) (defer to next venue of care)  ?Recommendations for Other Services ?    ?  ?Functional Status Assessment Patient has had a recent decline in their functional status and/or demonstrates limited ability to make significant improvements in function in a reasonable and predictable amount of time  ? ?  ?Precautions / Restrictions Precautions ?Precautions: Fall ?Restrictions ?Weight Bearing Restrictions: No  ? ?  ? ?Mobility ? Bed Mobility ?Overal bed mobility: Needs Assistance ?Bed Mobility: Supine to Sit, Sit to Supine ?  ?  ?Supine to sit: Min guard ?Sit to supine: Min guard ?  ?General bed mobility comments: min guard for partial sitting EOB but then patient reporting "dizziness" and returning to supine and becoming increasingly agitated and raising voice at therapist ?  ? ?Transfers ?  ?  ?  ?  ?  ?  ?  ?  ?  ?General transfer comment: patient refused due to dizziness and "feeling unwell" ?  ? ?Ambulation/Gait ?  ?  ?  ?  ?  ?  ?  ?  ? ?Stairs ?  ?  ?  ?  ?  ? ?Wheelchair Mobility ?  ? ?Modified Rankin (Stroke Patients Only) ?  ? ?  ? ?Balance   ?  ?  ?  ?  ?  ?  ?  ?  ?  ?  ?  ?  ?  ?  ?  ?  ?  ?  ?   ? ? ? ?Pertinent Vitals/Pain Pain Assessment ?Pain Assessment: Faces ?Faces Pain Scale: Hurts little more ?Pain Location: "all over" ?Pain Descriptors / Indicators: Grimacing ?Pain Intervention(s): Monitored during session  ? ? ?Home Living Family/patient expects to be discharged to:: Private residence ?Living Arrangements: Other relatives ?  ?Type of Home: Apartment ?  ?  ?  ?  ?  ?  ?Additional Comments: limited information provided  by patient due to agitation  ?  ?Prior Function Prior Level of Function : Other (comment) (unsure due to patient agitation and limited information provided) ?  ?  ?  ?  ?  ?  ?  ?  ?  ? ? ?Hand Dominance  ?   ? ?  ?Extremity/Trunk Assessment  ? Upper Extremity Assessment ?Upper Extremity Assessment: Defer to OT evaluation ?   ? ?Lower Extremity Assessment ?Lower Extremity Assessment: Generalized weakness (B feet edematous) ?  ? ?Cervical / Trunk Assessment ?Cervical / Trunk Assessment: Kyphotic  ?Communication  ? Communication: HOH;Other (comment) (at times, difficult to understand due to muffled and mumbling)  ?Cognition Arousal/Alertness: Awake/alert ?Behavior During Therapy: Agitated ?Overall Cognitive Status: No family/caregiver present to determine baseline cognitive functioning ?  ?  ?  ?  ?  ?  ?  ?  ?  ?  ?  ?  ?  ?  ?  ?  ?General Comments: difficult to assess due to agitation and not willing to answer questions ?  ?  ? ?  ?General Comments   ? ?  ?Exercises    ? ?Assessment/Plan  ?  ?PT Assessment Patient needs continued PT services  ?PT Problem List Decreased strength;Decreased activity tolerance;Decreased balance;Decreased mobility;Decreased cognition;Decreased knowledge of use of DME;Decreased safety awareness;Decreased knowledge of precautions;Cardiopulmonary status limiting activity ? ?   ?  ?PT Treatment Interventions DME instruction;Functional mobility training;Therapeutic activities;Balance training;Therapeutic exercise;Gait training;Stair training;Neuromuscular re-education;Patient/family education   ? ?PT Goals (Current goals can be found in the Care Plan section)  ?Acute Rehab PT Goals ?Patient Stated Goal: did not state ?PT Goal Formulation: With patient ?Time For Goal Achievement: 05/30/21 ?Potential to Achieve Goals: Fair ? ?  ?Frequency Min 2X/week ?  ? ? ?Co-evaluation PT/OT/SLP Co-Evaluation/Treatment: Yes ?Reason for Co-Treatment: Necessary to address cognition/behavior during functional activity;For patient/therapist safety ?  ?  ?  ? ? ?  ?AM-PAC PT "6 Clicks" Mobility  ?Outcome Measure Help needed turning from your back to your side while in a flat bed without using bedrails?: A Little ?Help needed moving from lying on your back to sitting on the side of a flat bed without using bedrails?: A Little ?Help  needed moving to and from a bed to a chair (including a wheelchair)?: Total ?Help needed standing up from a chair using your arms (e.g., wheelchair or bedside chair)?: Total ?Help needed to walk in hospital room?: Total ?Help needed climbing 3-5 steps with a railing? : Total ?6 Click Score: 10 ? ?  ?End of Session   ?Activity Tolerance: Treatment limited secondary to agitation ?Patient left: in bed;with call bell/phone within reach ?Nurse Communication: Mobility status ?PT Visit Diagnosis: Muscle weakness (generalized) (M62.81);Other abnormalities of gait and mobility (R26.89);Unsteadiness on feet (R26.81);Difficulty in walking, not elsewhere classified (R26.2) ?  ? ?Time: 7867-6720 ?PT Time Calculation (min) (ACUTE ONLY): 14 min ? ? ?Charges:   PT Evaluation ?$PT Eval Moderate Complexity: 1 Mod ?  ?  ?   ? ? ?Xyla Leisner A. Gilford Rile, PT, DPT ?Acute Rehabilitation Services ?Pager [phone] ?Office [phone] ? ? ?Ahkeem Goede A Tomas Schamp ?05/16/2021, 10:53 AM ? ?

## 2021-05-16 NOTE — Progress Notes (Signed)
?   ? ? ? ? ?[facility] ? ?Date of Admission:  05/09/2021    ? ?Principal Problem: ?  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  Cryptococcal meningitis ([place]) ?  Leukocytopenia ?  AIDS (acquired immune deficiency syndrome) ([place]) ?  COVID-19 ?  Normocytic anemia ?  Nocardia infection ?  Hyponatremia ?  GERD without esophagitis ?  Protein-calorie malnutrition, severe ?  Thrombocytopenia ([place]) ?     ?    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 ?  ?#Disseminated MAC ?-Spoke with Dr, Picklesimer, Pathology and requested case  reviewed as pt has history of MAC. Amendment made that organism is consistent with Nocardia to MAC.  ?-General surgery consulted and no plans for excisional Bx as no superficial inguinal/axillary LN enlarged(excisional Bx of periaortic nodes deemed high risk) ?-CTS engaged for possible biopsy and pt is not a candidate for VATS/excisional mediastinal Bx). No current plans for excisional biospy. Per discussion with pulmonary EBUS with Bx is possible.  ?-No current plans for EBUS as pt had a core needle biopsy. ?-MRI head no evidence of CNS infection ?Recommendations:  ?-Continue MAC therapy 3 drug regimen rifabutin, azithromycin and ethambutol(started 04/04/21) ?-Continue goals of care discussion as no plan for excisional Bx, Lymphoma still on the differential ?-I spoke with the patient again  today and he stated he he can't take care of himself alone. OT at bedside for evaluation. Anticipate he will likely need SNF placement.  ?-Follow-up with ID Dr. Gale Journey on 06/05/21 ?  ?#HIV-AIDS ?-CD4 ?-Continue Truvada and Tivicay ?-bactrim for PJP PPX ?  ?#Hx of Cryptococcal meningitis ?-Continue  fluconazole for daily maintenance ?  ?  ?#Chronic COVID ?-Monitor for now ?-CT 20.4(44 cutoff). On malnupiravir x 6d ?-Pt has no respiratory symptoms ?  ?  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  ?#Substance abuse ?-Reports he recently used drugs, denies recent IVDA. He did not wish to offer any further information.  ? ?Microbiology:   ?Antibiotics: ?MAC therapy ?HIV ?Truvada and Tivicay ?Bactrim due to PPX ?  ? ?SUBJECTIVE: ?Resting in bed. OT at bedside. No new complaints.  ? ?Review of Systems: ?Review of Systems  ?All other systems reviewed and are negative. ? ? ?Scheduled Meds: ? vitamin C  500 mg Oral Daily  ? azithromycin  500 mg Oral Daily  ? dolutegravir  50 mg Oral Daily  ? emtricitabine-tenofovir  1 tablet Oral Daily  ? enoxaparin (LOVENOX) injection  40 mg Subcutaneous Q24H  ? ethambutol  1,000 mg Oral Daily  ? famotidine  20 mg Oral BID  ? feeding supplement  237 mL Oral TID BM  ? fluconazole  200 mg Oral Daily  ? folic acid  1 mg Oral Daily  ? guaiFENesin  600 mg Oral BID  ? megestrol  400 mg Oral Daily  ? mirtazapine  15 mg Oral QHS  ? pantoprazole  20 mg Oral Daily  ? rifabutin  300 mg Oral Daily  ? sulfamethoxazole-trimethoprim  1 tablet Oral Daily  ? zinc sulfate  220 mg Oral Daily  ? ?Continuous Infusions: ? ? ?PRN Meds:.acetaminophen **OR** acetaminophen, chlorpheniramine-HYDROcodone, guaiFENesin-dextromethorphan, labetalol, magnesium hydroxide, ondansetron **OR** ondansetron (ZOFRAN) IV, oxyCODONE ?Allergies  ?Allergen Reactions  ? Bactrim [Sulfamethoxazole-Trimethoprim] Other (  See Comments)  ?  Patient was trialed on DS TIW and SS daily for PJP prophylaxis and developed hyperkalemia and increased Scr (pt is currently taking bactrim per infectious disease notes 05/07/21)  ? ? ?OBJECTIVE: ?Vitals:  ? 05/15/21 0509 05/15/21 1750 05/15/21 2010 05/16/21 1021  ?BP: 123/85 (!) 126/97 (!) 135/99 112/81  ?Pulse: 97 98 (!) 104 98  ?Resp: _0 ?Temp: 98 ?F (36.7 ?C) 98.3 ?F (36.8 ?C) 97.7 ?F (36.5 ?C)  97.7 ?F (36.5 ?C)  ?TempSrc: Oral Oral Oral Oral  ?SpO2: 99% 99% 100% 100%  ?Weight:      ?Height:      ? ?Body mass index is 18.5 kg/m?. ? ?Physical Exam ?Constitutional:   ?   General: He is not in acute distress. ?   Appearance: He is not toxic-appearing.  ?   Comments: Temporal wasting  ?HENT:  ?   Head: Normocephalic and atraumatic.  ?   Right Ear: External ear normal.  ?   Left Ear: External ear normal.  ?   Nose: No congestion or rhinorrhea.  ?   Mouth/Throat:  ?   Mouth: Mucous membranes are moist.  ?   Pharynx: Oropharynx is clear.  ?Eyes:  ?   Extraocular Movements: Extraocular movements intact.  ?   Conjunctiva/sclera: Conjunctivae normal.  ?   Pupils: Pupils are equal, round, and reactive to light.  ?Cardiovascular:  ?   Rate and Rhythm: Normal rate and regular rhythm.  ?   Heart sounds: No murmur heard. ?  No friction rub. No gallop.  ?Pulmonary:  ?   Effort: Pulmonary effort is normal.  ?   Breath sounds: Normal breath sounds.  ?Abdominal:  ?   General: Abdomen is flat. Bowel sounds are normal.  ?   Palpations: Abdomen is soft.  ?Musculoskeletal:     ?   General: No swelling. Normal range of motion.  ?   Cervical back: Normal range of motion and neck supple.  ?Skin: ?   General: Skin is warm and dry.  ?Neurological:  ?   General: No focal deficit present.  ?   Mental Status: He is oriented to person, place, and time.  ?Psychiatric:     ?   Mood and Affect: Mood normal.  ? ? ? ? ?Lab Results ?Lab Results  ?Component Value Date  ? WBC 3.2 (L) 05/16/2021  ? HGB 8.2 (L) 05/16/2021  ? HCT 24.2 (L) 05/16/2021  ? MCV 98.4 05/16/2021  ? PLT 136 (L) 05/16/2021  ?  ?Lab Results  ?Component Value Date  ? CREATININE 0.87 05/16/2021  ? BUN 11 05/16/2021  ? NA 134 (L) 05/16/2021  ? K 3.9 05/16/2021  ? CL 112 (H) 05/16/2021  ? CO2 20 (L) 05/16/2021  ?  ?Lab Results  ?Component Value Date  ? ALT 27 05/16/2021  ? AST 58 (H) 05/16/2021  ? ALKPHOS 127 (H) 05/16/2021  ? BILITOT 2.6 (H) 05/16/2021  ?  ? ? ? ? ?Laurice Record, MD ?[facility] ?[facility] ?05/16/2021, 1:26 PM  ?

## 2021-05-16 NOTE — Progress Notes (Signed)
Nutrition Follow-up ? ?DOCUMENTATION CODES:  ?Severe malnutrition in context of chronic illness ? ?INTERVENTION:  ?-continue regular diet given severe malnutrition and poor po intake ?-continue Ensure Enlive po TID, each supplement provides 350 kcal and 20 grams of protein. ? ?NUTRITION DIAGNOSIS:  ?Severe Malnutrition related to chronic illness (HIV) as evidenced by severe muscle depletion, severe fat depletion, energy intake < 75% for > or equal to 1 month, percent weight loss. ?Ongoing  ? ?GOAL:  ?Patient will meet greater than or equal to 90% of their needs ?Progressing  ? ?MONITOR:  ?PO intake, Supplement acceptance, Labs, Weight trends, I & O's ? ?REASON FOR ASSESSMENT:  ?Consult ?Assessment of nutrition requirement/status ? 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 ? ?Pt with ongoing poor appetite/PO intake. Last 8 meals documented as 0-25% completion (10% avg meal intake). Per RN, pt doing well with ONS. Recommend continue current nutrition plan of care. If pt continues to have poor po intake and if aligned with GOC, consider initiation of TF to help pt meet nutritional needs. However, noted pending PMT consult so unsure if aligned with GOC at this time.  ? ?UOP: 522m x24 hours ?I/O: +5538msince admit ? ?Medications: ? vitamin C  500 mg Oral Daily  ? famotidine  20 mg Oral BID  ? feeding supplement  237 mL Oral TID BM  ? folic acid  1 mg Oral Daily  ? mirtazapine  15 mg Oral QHS  ? pantoprazole  20 mg Oral Daily  ? zinc sulfate  220 mg Oral Daily  ?IVF: NS _0 /hr  ? ?Labs: ?Recent Labs  ?Lab 05/09/21 ?2251 05/10/21 ?0251 05/14/21 ?0413 05/15/21 ?0641284/13/23 ?0354  ?NA 125*   < > 132* 132* 134*  ?K 4.3   < > 4.0 3.9 3.9  ?CL 97*   < > 108 107 112*  ?CO2 24    < > 21* 21* 20*  ?BUN 15   < > _1 ?CREATININE 1.17   < > 0.90 0.96 0.87  ?CALCIUM 7.6*   < > 7.4* 7.5* 7.4*  ?MG 1.8  --   --   --   --   ?GLUCOSE 74   < > 98 87 97  ? < > = values in this interval not displayed.  ?Hgb 8.2 ? ?Diet Order:   ?Diet Order   ? ?       ?  Diet regular Room service appropriate? Yes; Fluid consistency: Thin  Diet effective now       ?  ? ?  ?  ? ?  ? ?EDUCATION NEEDS:  ?No education needs have been identified at this time ? ?Skin:  Skin Assessment: Skin Integrity Issues: ?Skin Integrity Issues:: Incisions ?Incisions: back ? ?Last BM:  4/12 ? ?Height:  ?Ht Readings from Last 1 Encounters:  ?05/10/21 _2  (1.854 m)  ? ?Weight:  ?Wt Readings from Last 10 Encounters:  ?05/10/21 63.6 kg  ?05/07/21 63.6 kg  ?05/01/21 63.5 kg  ?04/18/21 65.9 kg  ?04/05/21 66.7 kg  ?04/04/21 66.2 kg  ?04/03/21 68 kg  ?03/23/21 77.1 kg  ?01/21/21 77.1 kg  ?10/25/20 76.2 kg  ? ?BMI:  Body mass index is 18.5 kg/m?. ? ?Estimated Nutritional Needs:  ?Kcal:  2000-2200 ?Protein:  100-110 grams ?Fluid:  >2 ? ? ? ?Fanchon Papania A.,  MS, RD, LDN (she/her/hers) ?RD pager number and weekend/on-call pager number located in [place]. ?

## 2021-05-16 NOTE — Evaluation (Signed)
Occupational Therapy Evaluation ?Patient Details ?Name: Ricky [name] ?MRN: [id number] ?DOB: 1955-06-09 ?Today's Date: 05/16/2021 ? ? ?History of Present Illness 66 y/o male admitted on 05/09/21 after recent lymph node biopsy that shows disseminated nocardia infection. Not a candidate for VATS/excisional biopsy of mediastinal adenopathy. PMH: HIV/AIDS, hx of cryptococcal meningitis, chronic COVID, hx of TB with chronic cavitary lung disease, substance abuse, generalized lymphadenopathy  ? ?Clinical Impression ?  ?Pt admitted for concerns listed above. PTA pt reported that he was independent with all ADL's and functional mobility. At this time, pt presents with increased agitation, pain, weakness, decreased activity tolerance, and balance deficits. Session was limited due to pt refusing to participate past sitting EOB with max verbal encouragement. Pt was educated on importance of mobility and sitting up throughout session. Recommending SNF at this time to maximize his independence and safety. OT will follow acutely.  ?   ? ?Recommendations for follow up therapy are one component of a multi-disciplinary discharge planning process, led by the attending physician.  Recommendations may be updated based on patient status, additional functional criteria and insurance authorization.  ? ?Follow Up Recommendations ? Skilled nursing-short term rehab (<3 hours/day)  ?  ?Assistance Recommended at Discharge Frequent or constant Supervision/Assistance  ?Patient can return home with the following A lot of help with walking and/or transfers;A lot of help with bathing/dressing/bathroom;Assistance with cooking/housework;Direct supervision/assist for medications management;Direct supervision/assist for financial management;Assist for transportation;Help with stairs or ramp for entrance ? ?  ?Functional Status Assessment ? Patient has had a recent decline in their functional status and demonstrates the ability to make significant  improvements in function in a reasonable and predictable amount of time.  ?Equipment Recommendations ? Other (comment) (unsure what pt has at home)  ?  ?Recommendations for Other Services   ? ? ?  ?Precautions / Restrictions Precautions ?Precautions: Fall ?Restrictions ?Weight Bearing Restrictions: No  ? ?  ? ?Mobility Bed Mobility ?Overal bed mobility: Needs Assistance ?Bed Mobility: Supine to Sit, Sit to Supine ?  ?  ?Supine to sit: Min guard ?Sit to supine: Min guard ?  ?General bed mobility comments: min guard for partial sitting EOB but then patient reporting "dizziness" and returning to supine and becoming increasingly agitated and raising voice at therapist ?  ? ?Transfers ?  ?  ?  ?  ?  ?  ?  ?  ?  ?General transfer comment: patient refused due to dizziness and "feeling unwell" ?  ? ?  ?Balance Overall balance assessment: No apparent balance deficits (not formally assessed) (in sitting only) ?  ?  ?  ?  ?  ?  ?  ?  ?  ?  ?  ?  ?  ?  ?  ?  ?  ?  ?   ? ?ADL either performed or assessed with clinical judgement  ? ?ADL Overall ADL's : Needs assistance/impaired ?  ?  ?  ?  ?  ?  ?  ?  ?  ?  ?  ?  ?  ?  ?  ?  ?  ?  ?  ?General ADL Comments: Unable to obtain a full assessment due to pt agitation and self limiting behaviors. Anticipate pt will need at least mod A for OOB tasks and increased time for all tasks  ? ? ? ?Vision Baseline Vision/History: 0 No visual deficits ?Ability to See in Adequate Light: 1 Impaired ?Patient Visual Report: No change from baseline ?Vision Assessment?: No apparent visual  deficits  ?   ?Perception   ?  ?Praxis   ?  ? ?Pertinent Vitals/Pain Pain Assessment ?Pain Assessment: Faces ?Faces Pain Scale: Hurts little more ?Pain Location: "all over" ?Pain Descriptors / Indicators: Grimacing ?Pain Intervention(s): Monitored during session, Patient requesting pain meds-RN notified  ? ? ? ?Hand Dominance   ?  ?Extremity/Trunk Assessment Upper Extremity Assessment ?Upper Extremity Assessment:  Generalized weakness ?  ?Lower Extremity Assessment ?Lower Extremity Assessment: Defer to PT evaluation ?  ?Cervical / Trunk Assessment ?Cervical / Trunk Assessment: Kyphotic ?  ?Communication Communication ?Communication: HOH;Other (comment) (at times, difficult to understand due to muffled and mumbling) ?  ?Cognition Arousal/Alertness: Awake/alert ?Behavior During Therapy: Agitated ?Overall Cognitive Status: No family/caregiver present to determine baseline cognitive functioning ?  ?  ?  ?  ?  ?  ?  ?  ?  ?  ?  ?  ?  ?  ?  ?  ?General Comments: difficult to assess due to agitation and not willing to answer questions ?  ?  ?General Comments  VSS on RA - pt increasingly agitated to the point of yelling at therapists for asking questions. ? ?  ?Exercises   ?  ?Shoulder Instructions    ? ? ?Home Living Family/patient expects to be discharged to:: Private residence ?Living Arrangements: Other relatives ?  ?Type of Home: Apartment ?  ?  ?  ?  ?  ?  ?  ?  ?  ?  ?  ?  ?  ?Additional Comments: limited information provided by patient due to agitation ?  ? ?  ?Prior Functioning/Environment Prior Level of Function : Other (comment) (unsure due to patient agitation and limited information provided) ?  ?  ?  ?  ?  ?  ?  ?  ?  ? ?  ?  ?OT Problem List: Decreased strength;Decreased range of motion;Decreased activity tolerance;Impaired balance (sitting and/or standing);Decreased cognition;Decreased safety awareness;Pain;Increased edema ?  ?   ?OT Treatment/Interventions: Self-care/ADL training;Therapeutic exercise;Energy conservation;DME and/or AE instruction;Therapeutic activities;Cognitive remediation/compensation;Patient/family education;Balance training  ?  ?OT Goals(Current goals can be found in the care plan section) Acute Rehab OT Goals ?Patient Stated Goal: to be left alone ?OT Goal Formulation: With patient ?Time For Goal Achievement: 05/30/21 ?Potential to Achieve Goals: Good ?ADL Goals ?Pt Will Perform Lower Body  Bathing: with min guard assist;sitting/lateral leans;sit to/from stand ?Pt Will Perform Lower Body Dressing: with min guard assist;sitting/lateral leans;sit to/from stand ?Pt Will Transfer to Toilet: with min assist;ambulating ?Pt Will Perform Toileting - Clothing Manipulation and hygiene: with min guard assist;sitting/lateral leans;sit to/from stand ?Additional ADL Goal #1: pt will follow 2-3 step commands 100% of sessions with independence.  ?OT Frequency: Min 2X/week ?  ? ?Co-evaluation PT/OT/SLP Co-Evaluation/Treatment: Yes ?Reason for Co-Treatment: Necessary to address cognition/behavior during functional activity;For patient/therapist safety ?  ?OT goals addressed during session: Strengthening/ROM ?  ? ?  ?AM-PAC OT "6 Clicks" Daily Activity     ?Outcome Measure Help from another person eating meals?: A Little ?Help from another person taking care of personal grooming?: A Little ?Help from another person toileting, which includes using toliet, bedpan, or urinal?: A Lot ?Help from another person bathing (including washing, rinsing, drying)?: A Lot ?Help from another person to put on and taking off regular upper body clothing?: A Little ?Help from another person to put on and taking off regular lower body clothing?: A Lot ?6 Click Score: 15 ?  ?End of Session Nurse Communication: Mobility status;Patient requests pain  meds ? ?Activity Tolerance: Patient limited by pain;Treatment limited secondary to agitation ?Patient left: in bed;with call bell/phone within reach;with bed alarm set ? ?OT Visit Diagnosis: Unsteadiness on feet (R26.81);Other abnormalities of gait and mobility (R26.89);Muscle weakness (generalized) (M62.81)  ?              ?Time: 0955-1010 ?OT Time Calculation (min): 15 min ?Charges:  OT General Charges ?$OT Visit: 1 Visit ?OT Evaluation ?$OT Eval Moderate Complexity: 1 Mod ? ?Breon Rehm H., OTR/L ?Acute Rehabilitation ? ?Jocelyne Reinertsen Elane Yolanda Bonine ?05/16/2021, 11:05 AM ?

## 2021-05-16 NOTE — Progress Notes (Signed)
?PROGRESS NOTE ?Ricky [name]  LSL:[id number] DOB: [birth year] DOA: 05/09/2021 ?PCP: Pcp, No  ? 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 ?Patient was admitted for further work-up being treated for sepsis due to MAI disseminated infection, bulky adenopathy concerning for lymphoproliferative cancer, severe symptomatic anemia likely from acute illness profound infection, along with a comfortable meningitis ARDS Nocardia infection hyponatremia COVID-19 positive status, GERD. ? ?Per report -Noncompliant with abx/HIV meds for weeks(months?) --> ID following on abx - abnormal lymphadenopathy - outpt PET scan abnormal - trying to coordinate biopsy with ID>Sx>Pulm>Oncology --> basically down to CT Sx at this point for mediastinal node biopsy and they feels he is not a candidate for  VATS/ Exciisional biopsy of metastatic adenopathy.   ?Patient is weak and frail seen by PT recommended skilled nursing facility, patient reports he is unable to take care of himself. ? ?Subjective: ?Seen and examined this morning.  Patient reports he is weak, has no appetite. ?Unable to take care of himself, he agrees to go to rehab facility. ? ?Assessment and Plan: ?Principal Problem: ?  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  Nocardia infection ?  COVID-19 ?  Hyponatremia ?  Cryptococcal meningitis ([place]) ?  AIDS (acquired immune deficiency syndrome) ([place]) ?  Leukocytopenia ?  Normocytic anemia ?  GERD without esophagitis ?  Protein-calorie malnutrition, severe ?  Thrombocytopenia  ([place]) ? ?Disseminated MAC ?Abnormal lymphadenopathy:  ?Trying to coordinate biopsy with ID>Sx>Pulm>Oncology.  CT surgery increased for possible biopsy but they felt he is not a candidate for VATS/excisional mediastinal biopsy, no current plan for excisional biopsy, per discussion by ID with pulmonary EBUS with biopsies possible, currently no current plan.  Lymphoma is still on the differential.moving forward as per ID plan is to cont MAC therapy 3 drug regimen started 04/04/2021.    ? ?AIDS-HIV: Continue current HIV medication as ordered per ID along with Bactrim and fluconazole prophylaxis  ? 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 ?Chronic COVID:Recurrence of infection is likely secondary to immunosuppression.  CT 20.4-44 is cut off, plan is for molnupiravir x6 D, he has no respiratory symptoms. ? ?Anemia normocytic likely from anemia of chronic disease.  Received 1 unit PRBC 4/11.  Hemoglobin improved. Monitor ?Thrombocytopenia monitor likely from HIV ?Leukopenia mild -monitor ?Recent Labs  ?Lab 05/14/21 ?0413 05/14/21 ?1157 05/15/21 ?2620 05/16/21 ?0354  ?HGB 6.7* 6.7* 8.3* 8.2*  ?HCT 19.9* 20.6* 24.3* 24.2*  ?WBC 4.3  --  3.9* 3.2*  ?PLT 154  --  131* 136*  ?  ?Hyponatremia: Improving on normal saline, cut down the rate, ENCOURAGE PO ?Recent Labs  ?Lab 05/12/21 ?0901 05/13/21 ?3559 05/14/21 ?0413 05/15/21 ?7416 05/16/21 ?0354  ?NA 130* 129* 132* 132* 134*  ?  ?GERD without esophagitis:continue PPI. ? ?History of substance abuse reported recently use drugs denies IVDU ? ?Lower leg edema from hypoalbuminemia, stop IV fluids, added TED hose. ? ?Deconditioning/debility: ?Patient is weak and frail seen by PT recommended skilled nursing facility, patient reports he is unable to take care of himself.  TOC consult for placement ? ?Severe malnutrition augment diet as below.  Add Megace to increase appetite. ?Nutrition Problem: Severe  Malnutrition ?Etiology: chronic illness  (HIV) ?Signs/Symptoms: severe muscle depletion, severe fat depletion, energy intake < 75% for > or equal to 1 month, percent weight loss ?Percent weight loss: 17.5 % (x2 months) ?Interventions: Ensure Enlive (each supplement provides 350kcal and 20 grams of protein), Liberalize Diet ? ?DVT prophylaxis: Place TED hose Start: 05/16/21 0932 ?enoxaparin (LOVENOX) injection 40 mg Start: 05/10/21 1800 ?Code Status:   Code Status: Full Code ?Family Communication:Plan of care discussed with patient/cousins at bedside 4/12. Called [place] his sister, she is agreeable for SNF.  ?Patient status is: inpatient Level of care: Telemetry Medical  ?Remains inpatient because:Ongoing multiple issues, holding skilled nursing.  Placement TOC consulted ?Patient currently not stable ? ?Dispo: The patient is from: Lives with sister. ?           Anticipated disposition: Unable to take care of himself, [facility] consult for placement. Dc once bed available. ? ?Mobility Assessment (last 72 hours)   ? ? Mobility Assessment   ? ? [place] Name 05/16/21 1056 05/16/21 1046 05/15/21 2010 05/15/21 0830 05/14/21 0800  ? Does patient have an order for bedrest or is patient medically unstable -- -- No - Continue assessment No - Continue assessment No - Continue assessment  ? What is the highest level of mobility based on the progressive mobility assessment? Level 2 ([place]) - Balance while sitting on edge of bed and cannot stand Level 2 ([place]) - Balance while sitting on edge of bed and cannot stand Level 3 (Stands with assist) - Balance while standing  and cannot march in place Level 4 (Walks with assist in room) - Balance while marching in place and cannot step forward and back - Complete Level 4 (Walks with assist in room) - Balance while marching in place and cannot step forward and back - Complete  ? Is the above level different from baseline mobility prior to current illness? -- -- Yes - Recommend PT order Yes - Recommend PT order Yes - Recommend PT  order  ? ?  ?  ? ?  ?  ? ?Objective: ?Vitals last 24 hrs: ?Vitals:  ? 05/15/21 0509 05/15/21 1750 05/15/21 2010 05/16/21 1021  ?BP: 123/85 (!) 126/97 (!) 135/99 112/81  ?Pulse: 97 98 (!) 104 98  ?Resp: _0 ?Temp: 98 ?F (36.7 ?C) 98.3 ?F (36.8 ?C) 97.7 ?F (36.5 ?C) 97.7 ?F (36.5 ?C)  ?TempSrc: Oral Oral Oral Oral  ?SpO2: 99% 99% 100% 100%  ?Weight:      ?Height:      ? ?Weight change:  ? ?Physical Examination: ?General exam: AA, ill looking, thin frail cachectic older than stated age, weak appearing. ?HEENT:Oral mucosa moist, Ear/Nose WNL grossly, dentition normal. ?Respiratory system: bilaterally diminished, no use of accessory muscle ?Cardiovascular system: S1 & S2 +, No JVD,. ?Gastrointestinal system: Abdomen soft,NT,ND,BS+ ?Nervous System:Alert, awake, moving extremities and grossly nonfocal ?Extremities: LE ankle edema ++, distal peripheral pulses palpable.  ?Skin: No rashes,no icterus. ?MSK: Normal muscle bulk,tone, power  ? ?Medications reviewed:  ?Scheduled Meds: ? vitamin C  500 mg Oral Daily  ? azithromycin  500 mg Oral Daily  ? dolutegravir  50 mg Oral Daily  ? emtricitabine-tenofovir  1 tablet Oral Daily  ? enoxaparin (LOVENOX) injection  40 mg Subcutaneous Q24H  ? ethambutol  1,000 mg Oral Daily  ? famotidine  20 mg Oral BID  ? feeding supplement  237 mL Oral TID BM  ? fluconazole  200 mg Oral Daily  ? folic acid  1 mg Oral Daily  ? guaiFENesin  600 mg Oral BID  ? megestrol  40 mg Oral Daily  ? mirtazapine  15 mg Oral QHS  ? pantoprazole  20 mg Oral Daily  ? rifabutin  300 mg Oral Daily  ? sulfamethoxazole-trimethoprim  1 tablet Oral Daily  ? zinc sulfate  220 mg Oral Daily  ? ?Continuous Infusions: ? ? ? ?  ?Diet Order   ? ?       ?  Diet regular Room service appropriate? Yes; Fluid consistency: Thin  Diet effective now       ?  ? ?  ?  ? ?  ?  ? ?Nutrition Problem: Severe Malnutrition ?Etiology: chronic illness (HIV) ?Signs/Symptoms: severe muscle depletion, severe fat depletion, energy intake  < 75% for > or equal to 1 month, percent weight loss ?Percent weight loss: 17.5 % (x2 months) ?Interventions: Ensure Enlive (each supplement provides 350kcal and 20 grams of protein), Liberalize Diet ? ? ?Intake/Output Summary (Last 2

## 2021-05-17 ENCOUNTER — Other Ambulatory Visit (HOSPITAL_COMMUNITY): Payer: Self-pay

## 2021-05-17 ENCOUNTER — Inpatient Hospital Stay (HOSPITAL_COMMUNITY): Payer: Medicaid Other

## 2021-05-17 DIAGNOSIS — R627 Adult failure to thrive: Secondary | ICD-10-CM

## 2021-05-17 DIAGNOSIS — A312 Disseminated mycobacterium avium-intracellulare complex (DMAC): Secondary | ICD-10-CM | POA: Diagnosis not present

## 2021-05-17 DIAGNOSIS — A439 Nocardiosis, unspecified: Secondary | ICD-10-CM | POA: Diagnosis not present

## 2021-05-17 DIAGNOSIS — Z7189 Other specified counseling: Secondary | ICD-10-CM

## 2021-05-17 DIAGNOSIS — Z515 Encounter for palliative care: Secondary | ICD-10-CM

## 2021-05-17 DIAGNOSIS — U071 COVID-19: Secondary | ICD-10-CM | POA: Diagnosis not present

## 2021-05-17 DIAGNOSIS — B20 Human immunodeficiency virus [HIV] disease: Secondary | ICD-10-CM | POA: Diagnosis not present

## 2021-05-17 DIAGNOSIS — E43 Unspecified severe protein-calorie malnutrition: Secondary | ICD-10-CM

## 2021-05-17 IMAGING — DX DG ABDOMEN 1V
2 series · 2 of 2 positions shown · non-contrast
Comparison: PET-CT dated [DATE].

CLINICAL DATA: Abdominal pain.

EXAM:
ABDOMEN - 1 VIEW

[abdomen supine (1 of 2)]
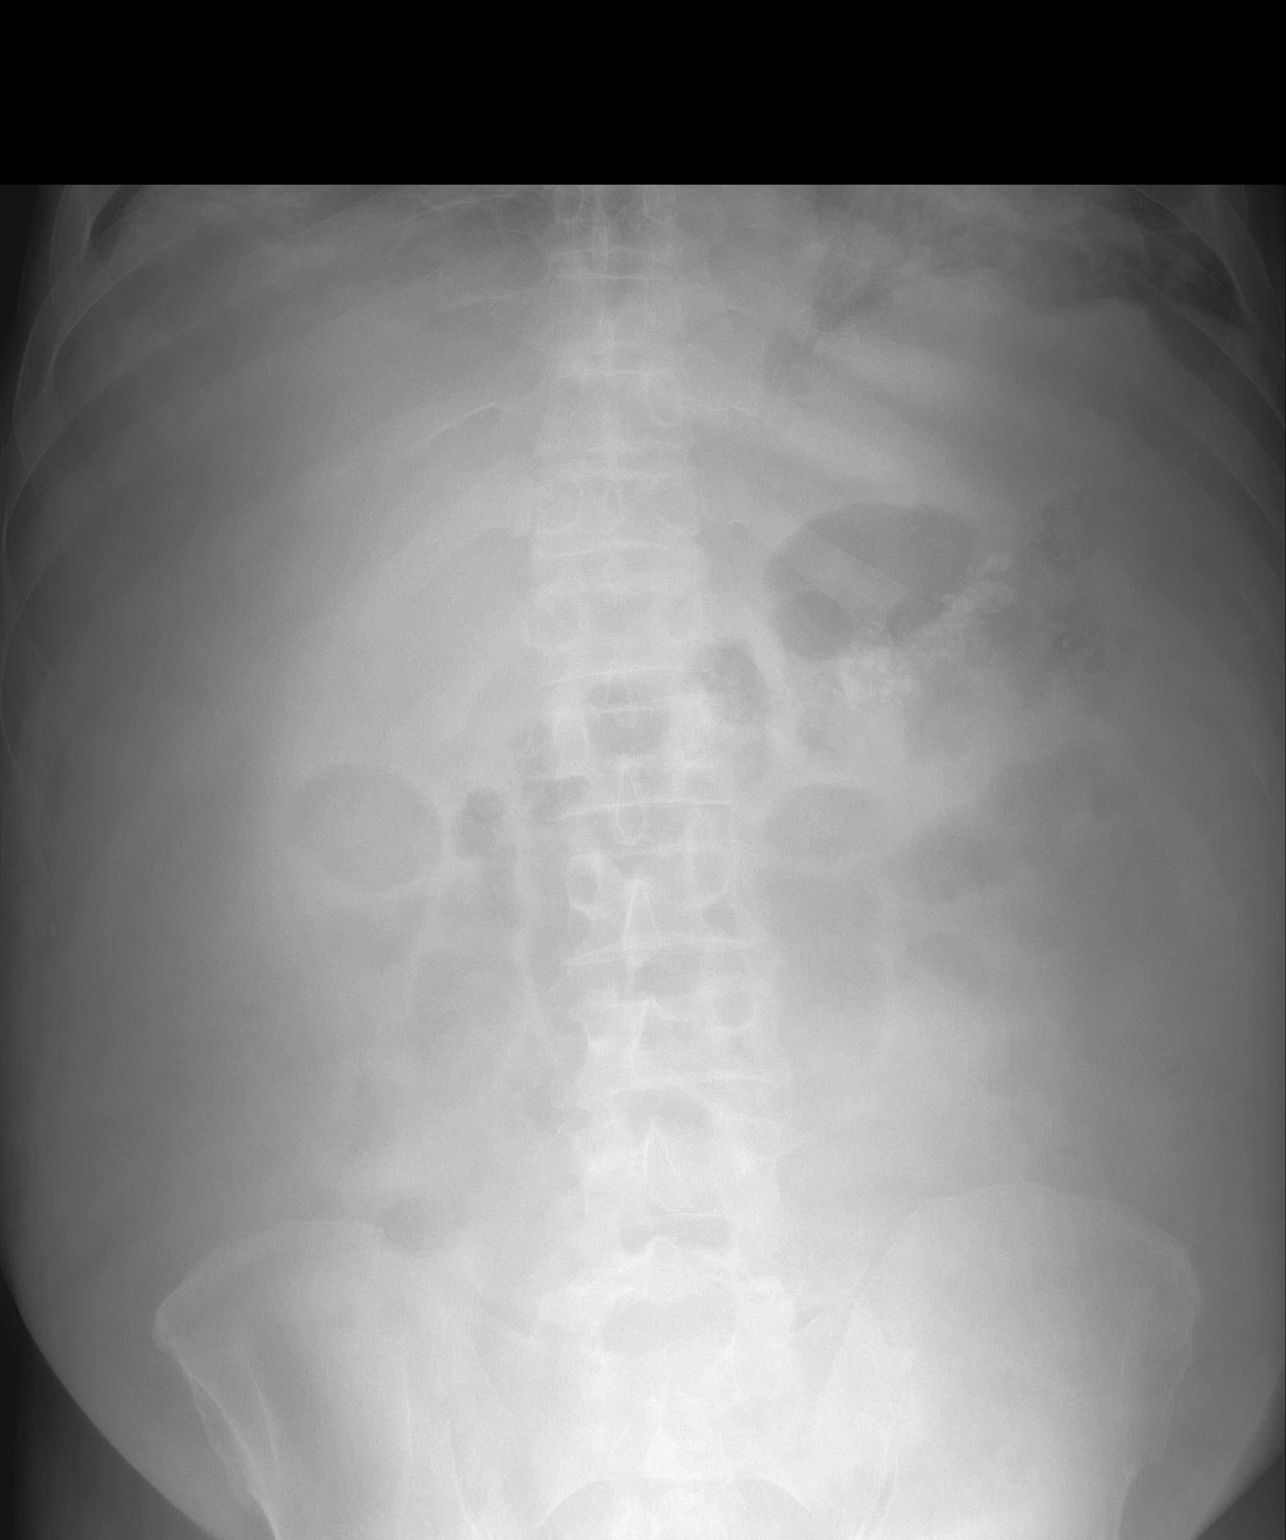

[abdomen supine (2 of 2)]
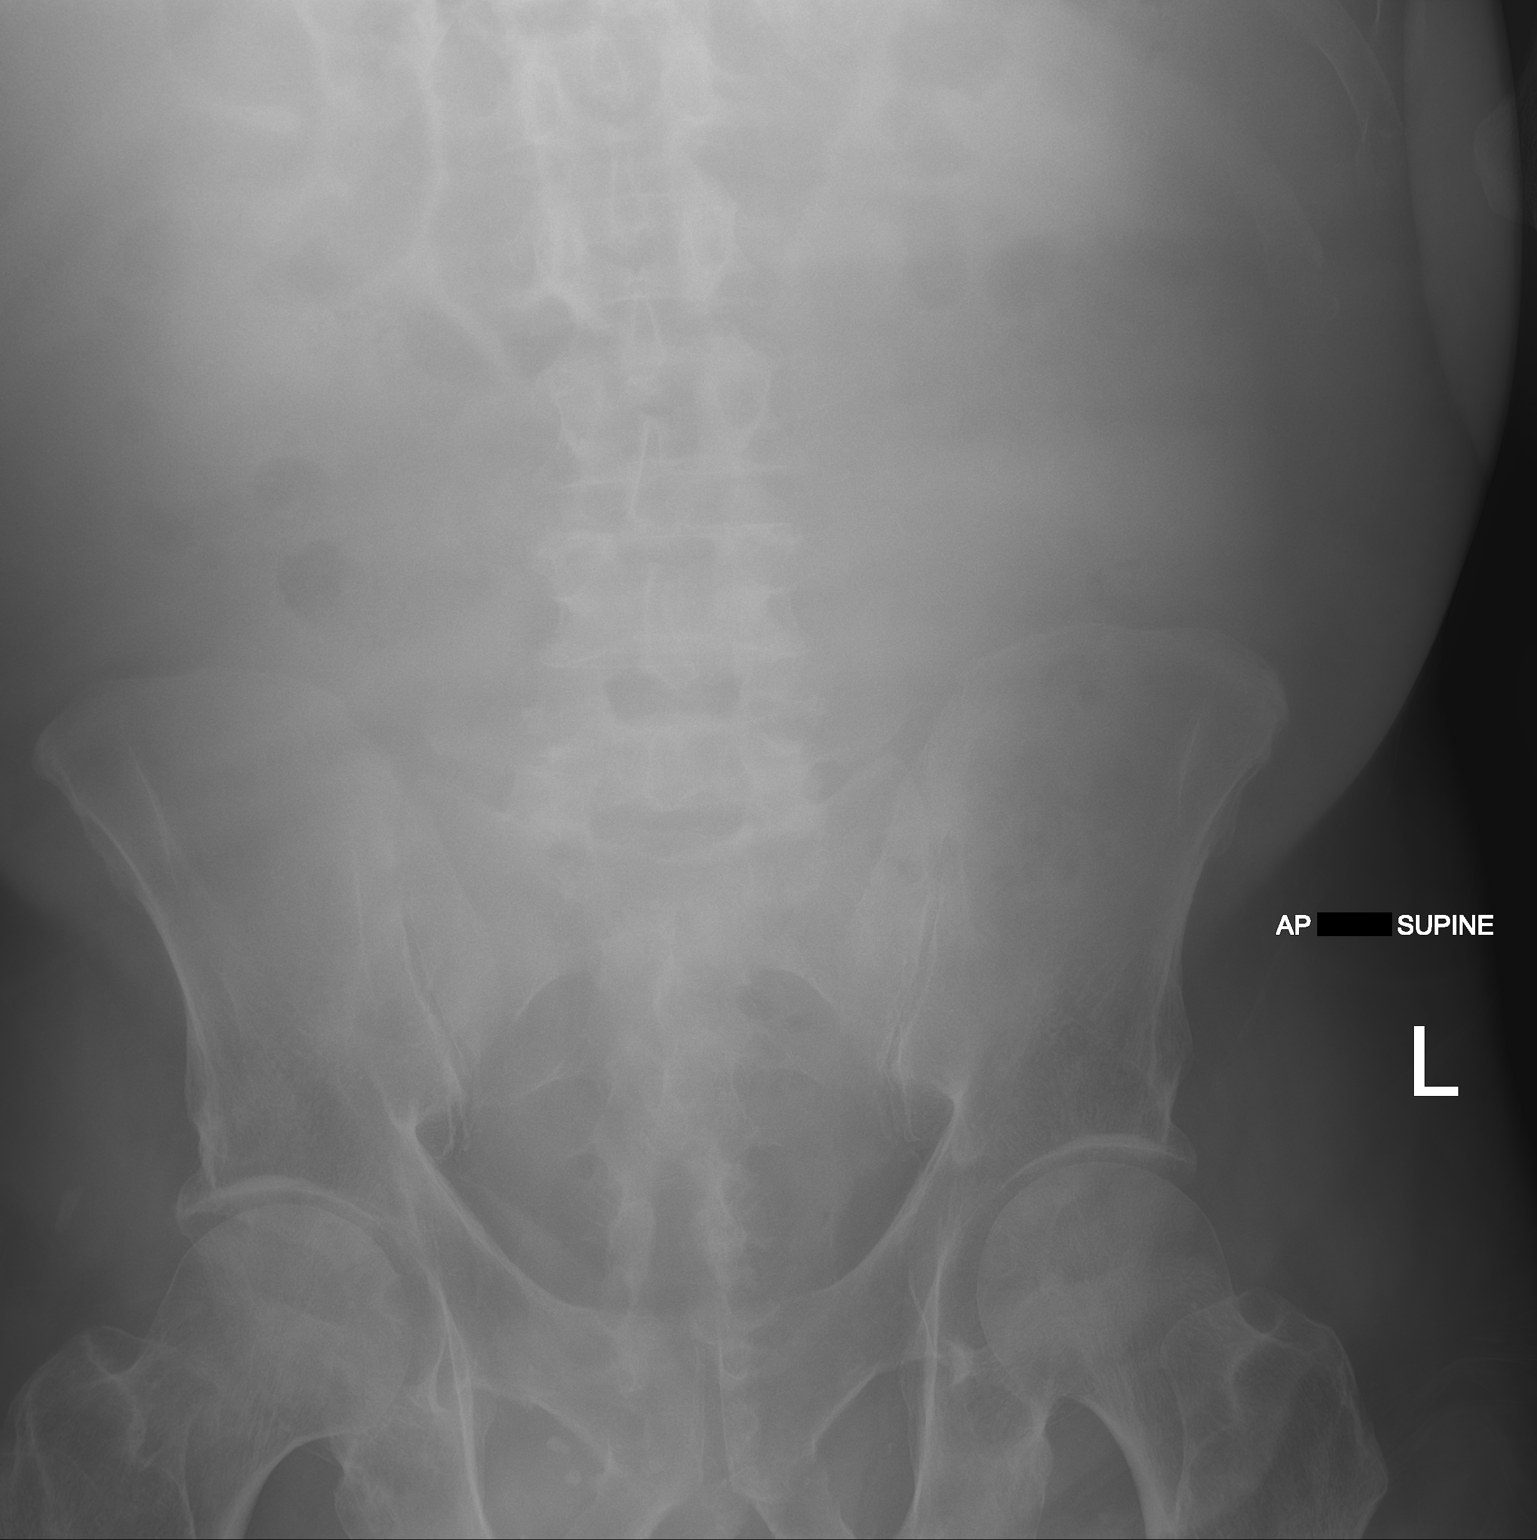

[2 of 2 positions shown; findings below may reference images not displayed]

FINDINGS: The bowel gas pattern is normal. Extensive pancreatic calcifications
again noted. No acute osseous abnormality.
IMPRESSION: 1. No acute abnormality.
2. Sequelae of chronic pancreatitis.

## 2021-05-17 MED ORDER — OXYCODONE HCL 5 MG PO TABS: 5.0000 mg | ORAL_TABLET | Freq: Two times a day (BID) | ORAL | 0 refills | Status: AC | PRN

## 2021-05-17 MED ORDER — ENSURE ENLIVE PO LIQD
237.0000 mL | Freq: Three times a day (TID) | ORAL | 12 refills | Status: DC
Start: 2021-05-17 — End: 2021-05-31
  Filled 2021-05-17: qty 237, 1d supply, fill #0

## 2021-05-17 MED ORDER — ONDANSETRON 4 MG PO TBDP
4.0000 mg | ORAL_TABLET | Freq: Two times a day (BID) | ORAL | Status: DC
  Administered 2021-05-17 – 2021-05-21 (×9): 4 mg via ORAL
  Filled 2021-05-17 (×10): qty 1

## 2021-05-17 MED ORDER — MEGESTROL ACETATE 400 MG/10ML PO SUSP
400.0000 mg | Freq: Every day | ORAL | 0 refills | Status: DC
  Filled 2021-05-17 (×2): qty 240, 24d supply, fill #0

## 2021-05-17 MED ORDER — MAGNESIUM HYDROXIDE 400 MG/5ML PO SUSP
30.0000 mL | Freq: Every day | ORAL | 0 refills | Status: DC | PRN
  Filled 2021-05-17: qty 355, 11d supply, fill #0

## 2021-05-17 MED ORDER — ASCORBIC ACID 500 MG PO TABS
500.0000 mg | ORAL_TABLET | Freq: Every day | ORAL | 0 refills | Status: AC
Start: 2021-05-17 — End: 2021-05-22
  Filled 2021-05-17: qty 5, 5d supply, fill #0

## 2021-05-17 MED ORDER — HYDROCOD POLI-CHLORPHE POLI ER 10-8 MG/5ML PO SUER
5.0000 mL | Freq: Two times a day (BID) | ORAL | 0 refills | Status: DC | PRN
  Filled 2021-05-17: qty 115, 12d supply, fill #0

## 2021-05-17 MED ORDER — ONDANSETRON 4 MG PO TBDP
4.0000 mg | ORAL_TABLET | Freq: Two times a day (BID) | ORAL | 0 refills | Status: DC
  Filled 2021-05-17: qty 20, 10d supply, fill #0

## 2021-05-17 MED ORDER — SENNOSIDES 8.8 MG/5ML PO SYRP
5.0000 mL | ORAL_SOLUTION | Freq: Two times a day (BID) | ORAL | Status: DC
  Administered 2021-05-17 – 2021-05-21 (×8): 5 mL via ORAL
  Filled 2021-05-17 (×12): qty 5

## 2021-05-17 MED ORDER — GUAIFENESIN-DM 100-10 MG/5ML PO SYRP
10.0000 mL | ORAL_SOLUTION | ORAL | 0 refills | Status: DC | PRN
  Filled 2021-05-17: qty 118, 2d supply, fill #0

## 2021-05-17 MED ORDER — ZINC SULFATE 220 (50 ZN) MG PO CAPS
220.0000 mg | ORAL_CAPSULE | Freq: Every day | ORAL | 0 refills | Status: AC
  Filled 2021-05-17: qty 5, 5d supply, fill #0

## 2021-05-17 MED ORDER — HYDROCOD POLI-CHLORPHE POLI ER 10-8 MG/5ML PO SUER: 5.0000 mL | Freq: Two times a day (BID) | ORAL | 0 refills | Status: DC | PRN

## 2021-05-17 MED ORDER — FAMOTIDINE 20 MG PO TABS
20.0000 mg | ORAL_TABLET | Freq: Two times a day (BID) | ORAL | 0 refills | Status: DC
  Filled 2021-05-17 (×2): qty 60, 30d supply, fill #0

## 2021-05-17 NOTE — TOC Progression Note (Signed)
Transition of Care (TOC) - Initial/Assessment Note  ? ? ?Patient Details  ?Name: Ricky [name] ?MRN: [id number] ?Date of Birth: 19-Aug-1955 ? ?Transition of Care (TOC) CM/SW Contact:    ?Paulene Floor Burdell Peed, LCSWA ?Phone Number: ?05/17/2021, 10:12 AM ? ?Clinical Narrative:                 ?CSW spoke with the patient's sister.  The family would like the patient to go home with home health.  RNCM notified.   ? ?  ?  ? ? ?Patient Goals and CMS Choice ?  ?  ?  ? ?Expected Discharge Plan and Services ?  ?  ?  ?  ?  ?Expected Discharge Date: 05/17/21               ?  ?  ?  ?  ?  ?  ?  ?  ?  ?  ? ?Prior Living Arrangements/Services ?  ?  ?  ?       ?  ?  ?  ?  ? ?Activities of Daily Living ?Home Assistive Devices/Equipment: Gilford Rile (specify type) ?ADL Screening (condition at time of admission) ?Patient's cognitive ability adequate to safely complete daily activities?: Yes ?Is the patient deaf or have difficulty hearing?: No ?Does the patient have difficulty seeing, even when wearing glasses/contacts?: No ?Does the patient have difficulty concentrating, remembering, or making decisions?: No ?Patient able to express need for assistance with ADLs?: Yes ?Does the patient have difficulty dressing or bathing?: No ?Independently performs ADLs?: Yes (appropriate for developmental age) ?Does the patient have difficulty walking or climbing stairs?: Yes ?Weakness of Legs: Both ?Weakness of Arms/Hands: Both ? ?Permission Sought/Granted ?  ?  ?   ?   ?   ?   ? ?Emotional Assessment ?  ?  ?  ?  ?  ?  ? ?Admission diagnosis:  Nocardia infection [A43.9] ?Patient Active Problem List  ? Diagnosis Date Noted  ? Thrombocytopenia ([place]) 05/15/2021  ? Hyponatremia 05/10/2021  ? GERD without esophagitis 05/10/2021  ? Protein-calorie malnutrition, severe 05/10/2021  ? 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 Bladder wall thickening 04/08/2021  ? Abdominal pain 04/08/2021  ? Normocytic anemia 04/08/2021  ? Nausea with vomiting 04/08/2021  ? Fever   ? Acute  cholecystitis 01/22/2021  ? Complicated UTI (urinary tract infection) 01/22/2021  ? Noncompliance 01/22/2021  ? Lymphadenopathy   ? Chronic low back pain   ? HIV infection ([place])   ? Community acquired pneumonia   ? Nonadherence to medical treatment   ? COVID-19 virus infection 10/13/2020  ? Alcohol abuse, daily use   ? COVID-19 10/12/2020  ? 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 Blurry vision, left eye   ? AKI (acute kidney injury) ([place])   ? Cavitary lesion of lung   ? Acute nonintractable headache   ? Smoking   ? Cocaine use   ? Paroxysmal SVT (supraventricular tachycardia) (H[facility])   ? Abnormal EKG   ? Atrial flutter ([place]) 03/27/2020  ? Aortic atherosclerosis ([place]) 03/27/2020  ? Hypertension 03/27/2020  ? Cryptococcal meningitis ([place]) 03/26/2020  ? Leukocytopenia 03/26/2020  ? Macrocytosis 03/26/2020  ? Moderate protein malnutrition ([place]) 03/26/2020  ? Hypoalbuminemia 03/26/2020  ? AIDS (acquired immune deficiency syndrome) ([place]) 03/26/2020  ? ?PCP:  Pcp, No ?Pharmacy:   ?[id number] [place] [facility] [address]?[place] [address]?Phone: [phone]: [phone]? ?[facility] ?[address] ?[place] [address] ?Phone: [phone] Fax: [phone] ? ? ? ? ?Social Determinants of Health (SDOH) Interventions ?  ? ?Readmission Risk Interventions ? ?  10/26/2020  ? 12:55 PM 04/25/2020  ? 10:22 AM  ?Readmission Risk Prevention Plan  ?Transportation Screening Complete Complete  ?PCP or Specialist Appt within 5-7 Days Complete   ?PCP or Specialist Appt within 3-5 Days  Complete  ?Home Care Screening Complete   ?[place] or Home Care Consult  Complete  ?Social Work Consult for [place] Planning/Counseling  Complete  ?Palliative Care Screening  Not Applicable  ?Medication Review Press photographer)  Complete  ? ? ? ?

## 2021-05-17 NOTE — Discharge Summary (Addendum)
?Physician Discharge Summary ?  ?Patient: Ricky [name] MRN: [id number] DOB: [birth year]  ?Admit date:     05/09/2021  ?Discharge date: 05/17/21  ?Discharge Physician: Valeria Batman Areta Terwilliger  ? ?PCP: Pcp, No  ? ?Recommendations at discharge:  ? ?Follow with infectious disease: Continue MAC therapy 3 drug regimen rifabutin, azithromycin and ethambutol(started 04/04/21) ?-Follow-up with palliative care team ? ?Discharge Diagnoses: ?Principal Problem: ?  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  Nocardia infection ?  COVID-19 ?  Hyponatremia ?  Cryptococcal meningitis ([place]) ?  AIDS (acquired immune deficiency syndrome) ([place]) ?  Leukocytopenia ?  Normocytic anemia ?  GERD without esophagitis ?  Protein-calorie malnutrition, severe ?  Thrombocytopenia ([place]) ? ?Resolved Problems: ?  * No resolved hospital problems. * ? 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 ?Patient was admitted for further work-up being treated for sepsis due to MAI disseminated infection, bulky adenopathy concerning for lymphoproliferative cancer, severe symptomatic anemia likely from acute illness profound infection, along with a comfortable meningitis ARDS Nocardia infection hyponatremia COVID-19 positive status, GERD. ? ?Per report -Noncompliant with abx/HIV meds for weeks(months?) --> ID following on abx - abnormal lymphadenopathy - outpt PET scan abnormal - trying to coordinate biopsy with ID>Sx>Pulm>Oncology --> basically down to CT Sx at this point for mediastinal node biopsy and they feels he is not a candidate for  VATS/  Exciisional biopsy of metastatic adenopathy.  ? ? ? ?Abnormal lymphadenopathy:  ?Trying to coordinate biopsy with ID>Sx>Pulm>Oncology.  CT surgery increased for possible biopsy but they felt he is not a candidate for VATS/excisional mediastinal biopsy, no current plan for excisional biopsy, per discussion by ID with pulmonary EBUS with biopsies possible, currently no current plan.  Lymphoma is still on the differential.moving forward as per ID plan is to cont MAC therapy 3 drug regimen started 04/04/2021.    ?  ?AIDS-HIV: Continue current HIV medication as ordered per ID along with Bactrim and fluconazole prophylaxis  ?  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  ?Chronic COVID:Recurrence of infection is likely secondary to immunosuppression.  CT 20.4-44 is cut off, plan is for molnupiravir x6 D, he has no respiratory symptoms. ?  ?Anemia normocytic likely from anemia of chronic disease.  Received 1 unit PRBC 4/11.  Hemoglobin improved. Monitor ?Thrombocytopenia monitor likely from HIV ?Leukopenia mild -monitor ?Last Labs   ?      ?Recent Labs  ?Lab 05/14/21 ?0413 05/14/21 ?1027 05/15/21 ?2536 05/16/21 ?0354  ?HGB 6.7* 6.7* 8.3* 8.2*  ?HCT 19.9* 20.6* 24.3* 24.2*  ?WBC 4.3  --  3.9* 3.2*  ?PLT 154  --  131* 136*  ?  ?  ?Hyponatremia: Improving on normal saline, cut down the rate, ENCOURAGE PO ?Last Labs   ?       ?Recent Labs  ?Lab 05/12/21 ?0901 05/13/21 ?6440 05/14/21 ?0413 05/15/21 ?3474 05/16/21 ?0354  ?NA 130* 129* 132* 132* 134*  ?  ?  ?GERD without esophagitis:continue PPI. ?  ?History of substance abuse reported recently use drugs denies IVDU ?  ?Lower leg edema from hypoalbuminemia, stop IV fluids, added TED hose. ?  ?Deconditioning/debility: ?Patient is weak and frail seen  by PT recommended skilled nursing facility, patient reports he is unable to take care of himself.  TOC consult for placement ?  ?Severe malnutrition augment diet as below.  Add Megace to increase  appetite. ?Nutrition Problem: Severe Malnutrition ?Etiology: chronic illness (HIV) ?Signs/Symptoms: severe muscle depletion, severe fat depletion, energy intake < 75% for > or equal to 1 month, percent weight loss ?Percent weight loss: 17.5 % (x2 months) ?Interventions: Ensure Enlive (each supplement provides 350kcal and 20 grams of protein), Liberalize Diet ?  ?DVT prophylaxis: Place TED hose Start: 05/16/21 0932 ? ?Code Status:   Code Status: Full Code ?Family Communication:Plan of care discussed with patient/ Brother on the phone 4/14. Called Ricky [name] his sister, she is agreeable for SNF.  ? ? ?Pain control - [employer] Controlled Substance Reporting System database was reviewed. and patient was instructed, not to drive, operate heavy machinery, perform activities at heights, swimming or participation in water activities or provide baby-sitting services while on Pain, Sleep and Anxiety Medications; until their outpatient Physician has advised to do so again. Also recommended to not to take more than prescribed Pain, Sleep and Anxiety Medications.  ?Consultants: CT surgery, ? ?Disposition: Nursing home ?Diet recommendation:  ?Discharge Diet Orders (From admission, onward)  ? ?  Start     Ordered  ? 05/17/21 0000  Diet - low sodium heart healthy       ? 05/17/21 0855  ? ?  ?  ? ?  ? ?Cardiac diet ?DISCHARGE MEDICATION: ?Allergies as of 05/17/2021   ? ?   Reactions  ? Bactrim [sulfamethoxazole-trimethoprim] Other (See Comments)  ? Patient was trialed on DS TIW and SS daily for PJP prophylaxis and developed hyperkalemia and increased Scr (pt is currently taking bactrim per infectious disease notes 05/07/21)  ? ?  ? ?  ?Medication List  ?  ? ?STOP taking these medications   ? ?fluticasone 50 MCG/ACT nasal spray ?Commonly known as: FLONASE ?  ?folic acid 1 MG tablet ?Commonly known as: FOLVITE ?  ?pantoprazole 20 MG tablet ?Commonly known as: PROTONIX ?  ? ?  ? ?TAKE these medications   ? ?acetaminophen 500 MG  tablet ?Commonly known as: TYLENOL ?Take 500 mg by mouth every 6 (six) hours as needed for moderate pain or headache. ?  ?ascorbic acid 500 MG tablet ?Commonly known as: VITAMIN C ?Take 1 tablet (500 mg total) by mouth daily for 5 days. ?  ?azithromycin 500 MG tablet ?Commonly known as: ZITHROMAX ?Take 1 tablet (500 mg total) by mouth daily. ?  ?chlorpheniramine-HYDROcodone 10-8 MG/5ML ?Take 5 mLs by mouth every 12 (twelve) hours as needed for up to 10 days for cough. ?  ?emtricitabine-tenofovir 200-300 MG tablet ?Commonly known as: TRUVADA ?Take 1 tablet by mouth daily. ?  ?ethambutol 400 MG tablet ?Commonly known as: MYAMBUTOL ?Take 2.5 tablets (1,000 mg total) by mouth daily. ?  ?famotidine 20 MG tablet ?Commonly known as: PEPCID ?Take 1 tablet by mouth 2 times daily. ?  ?feeding supplement Liqd ?Take 237 mLs by mouth 3 (three) times daily between meals. ?  ?fluconazole 200 MG tablet ?Commonly known as: DIFLUCAN ?Take 1 tablet (200 mg total) by mouth daily. ?  ?guaiFENesin-dextromethorphan 100-10 MG/5ML syrup ?Commonly known as: ROBITUSSIN DM ?Take 10 mLs by mouth every 4 (four) hours as needed for cough. ?  ?magnesium hydroxide 400 MG/5ML suspension ?Commonly known as: MILK OF MAGNESIA ?Take 30 mLs by mouth daily as needed for mild constipation. ?  ?megestrol 400 MG/10ML  suspension ?Commonly known as: MEGACE ?Take 10 mLs (400 mg total) by mouth daily. ?  ?mirtazapine 15 MG tablet ?Commonly known as: Remeron ?Take 1 tablet by mouth at bedtime. ?  ?ondansetron 4 MG disintegrating tablet ?Commonly known as: ZOFRAN-ODT ?Take 1 tablet (4 mg total) by mouth every 12 (twelve) hours. ?What changed:  ?when to take this ?reasons to take this ?  ?oxyCODONE 5 MG immediate release tablet ?Commonly known as: Oxy IR/ROXICODONE ?Take 1 tablet (5 mg total) by mouth every 12 (twelve) hours as needed for up to 3 days for breakthrough pain. ?What changed:  ?how much to take ?when to take this ?reasons to take this ?  ?rifabutin 150  MG capsule ?Commonly known as: MYCOBUTIN ?Take 2 capsules (300 mg total) by mouth daily. ?  ?sulfamethoxazole-trimethoprim 400-80 MG tablet ?Commonly known as: BACTRIM ?Take 1 tablet by mouth daily. ?  ?[place] 50

## 2021-05-17 NOTE — Consult Note (Signed)
?Consultation Note ?Date: 05/17/2021  ? ?Patient Name: Ricky [name]  ?DOB: [birth year]  MRN: [id number]  Age / Sex: 66 y.o., male  ?PCP: Pcp, No ?Referring Physician: Deatra James, MD ? ?Reason for Consultation: Establishing goals of care ? ?HPI/Patient Profile: 66 y.o. 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  Plan was for discharge home 4/14 with home health however that day patient appeared weaker and was unable to take oral medications.  Palliative medicine team consulted to discuss goals of care. ? ?Clinical Assessment and Goals of Care: ?I have reviewed medical records including EPIC notes, labs and imaging, received report from RN, assessed the patient and then met with patient, brother [name], and cousin to discuss diagnosis prognosis, GOC, EOL wishes, disposition and options. ? ?I attempted to discuss goals of care with patient however he was quite lethargic.  He kept his eyes shut for the majority of my visit.  I did ask him if it was okay if I discussed his care with his brother and he nodded. ? ?I introduced Palliative Medicine as specialized medical care for people living with serious illness. It focuses on providing relief from the symptoms and stress of a serious illness. The goal is to improve quality of life for both the patient and the family. ? ?We  discussed a brief life review of the patient.  Patient's brother tells me of recent difficulties -tells me that he used to spend a lot of time together about 3 years ago but then patient was missing for a couple of years and for the past year he has not been doing well.  Brother tells me he has lost about 100 pounds in the last 4 months.  He tells me patient is able to walk a few steps at a time but is too weak to bathe himself.  Tells me patient lives with her sister and she provides daily cares for him.  Brother tells me that patient typically cognitively intact. ? ? We discussed patient's current illness and what it means in the larger context of patient's on-going co-morbidities.  Natural disease trajectory and expectations at EOL were discussed.  We discussed that patient has multiple infections.  We discussed patient is very deconditioned and frail.  We discussed he is malnourished.  We discussed patient's frequent hospitalizations and ED visits.  I discussed my concern that on current treatment plan once patient is discharged he would likely be readmitted soon. ? ?I attempted to elicit values and goals of care important to the patient.  Patient's brother tells me this is not something we have discussed before so he is unsure. ? ?Patient's brother tells me there are 5 siblings total including patient but no one is named healthcare power of attorney.  Brother would like to discuss the situation with his other siblings.  He would also like to attempt to discuss the situation with patient as patient is able. ? ?We reviewed some things for him to talk with his siblings and patient about.  Specifically discussed if we should continue current care/repeat hospitalizations.  We also discussed CODE STATUS. Encouraged family to consider DNR/DNI status understanding evidenced based poor outcomes in similar hospitalized patients, as the cause of the arrest is likely associated with chronic/terminal disease rather than a  reversible acute cardio-pulmonary event.  Patient's brother tells me this is something he has heard before and he seems to agree but again wants to discuss this with other family.  I also bring up hospice and ask him to discuss this with family as well.  I discussed the philosophy of hospice care and what type of support could be provided. ? ?Brother and I made a plan to give him the evening to consider the information and discussed with family and patient is able and I will plan to follow-up with him tomorrow for further goals of care discussions. ? ?Discussed with brother the importance of continued conversation with family and the medical providers regarding overall plan of care and treatment options, ensuring decisions are within the context of the patient?s values and GOCs.   ? ?Questions and concerns were addressed. The family was encouraged to call with questions or concerns. ? ?Primary Decision Maker ?NEXT OF KIN -4 siblings ?  ? ?SUMMARY OF RECOMMENDATIONS   ?Initial goals of care conversation with patient's brother, conversation was had in room with patient however patient was unable to participate, quite lethargic during conversation ?Asked brother to consider current course, CODE STATUS, hospice support -brother would like time to speak these topics over with other family members and attempted to discuss it further with patient ?We will follow-up goals of care discussions tomorrow 4/15 ? ?Code Status/Advance Care Planning: ?Full ? ?  ? ?Primary Diagnoses: ?Present on Admission: ? Nocardia infection ? COVID-19 ? AIDS (acquired immune deficiency syndrome) ([place]) ? Cryptococcal meningitis ([place]) ? 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 Normocytic anemia ? Leukocytopenia ? ? ?I have reviewed the medical record, interviewed the patient and family, and examined the patient. The following aspects are pertinent. ? ?Past Medical History:  ?Diagnosis Date  ? Human immunodeficiency  virus (HIV) disease ([place]) 03/26/2020  ? ?Social History  ? ?Socioeconomic History  ? Marital status: Married  ?  Spouse name: Not on file  ? Number of children: Not on file  ? Years of education: Not on file  ? Highest education level: Not on file  ?Occupational History  ? Not on file  ?Tobacco Use  ? Smoking status: Former  ?  Types: Cigarettes  ? Smokeless tobacco: Former  ? Tobacco comments:  ?  Smokes 1 - 2 cigarttes a day 04/18/21  ?Substance and Sexual Activity  ? Alcohol use: Yes  ?  Alcohol/week: 14.0 - 21.0 standard drinks  ?  Types: 14 - 21 Cans of beer per week  ? Drug use: Not Currently  ? Sexual activity: Not on file  ?Other Topics Concern  ? Not on file  ?Social History Narrative  ? Not on file  ? ?Social Determinants of Health  ? ?Financial Resource Strain: Not on file  ?Food Insecurity: Not on file  ?Transportation Needs: Not on file  ?Physical Activity: Not on file  ?Stress: Not on file  ?Social Connections: Not on file  ? ?Family History  ?Problem Relation Age of Onset  ? Hypertension Mother   ? Kidney disease Mother   ? Hypertension Sister   ? Hypertension Brother   ? Kidney cancer Brother   ? Colon cancer Neg Hx   ?  Liver cancer Neg Hx   ? Pancreatic cancer Neg Hx   ? Esophageal cancer Neg Hx   ? ?Scheduled Meds: ? vitamin C  500 mg Oral Daily  ? azithromycin  500 mg Oral Daily  ? dolutegravir  50 mg Oral Daily  ? emtricitabine-tenofovir  1 tablet Oral Daily  ? enoxaparin (LOVENOX) injection  40 mg Subcutaneous Q24H  ? ethambutol  1,000 mg Oral Daily  ? famotidine  20 mg Oral BID  ? feeding supplement  237 mL Oral TID BM  ? fluconazole  200 mg Oral Daily  ? folic acid  1 mg Oral Daily  ? guaiFENesin  600 mg Oral BID  ? megestrol  400 mg Oral Daily  ? mirtazapine  15 mg Oral QHS  ? ondansetron  4 mg Oral Q12H  ? pantoprazole  20 mg Oral Daily  ? rifabutin  300 mg Oral Daily  ? sennosides  5 mL Oral BID  ? sulfamethoxazole-trimethoprim  1 tablet Oral Daily  ? zinc sulfate  220 mg Oral Daily   ? ?Continuous Infusions: ?PRN Meds:.acetaminophen **OR** acetaminophen, chlorpheniramine-HYDROcodone, guaiFENesin-dextromethorphan, labetalol, magnesium hydroxide, ondansetron **OR** ondansetron (ZOFRAN) IV, oxyCODON

## 2021-05-17 NOTE — Plan of Care (Signed)
  Problem: Education: Goal: Knowledge of General Education information will improve Description Including pain rating scale, medication(s)/side effects and non-pharmacologic comfort measures Outcome: Progressing   

## 2021-05-17 NOTE — Evaluation (Signed)
Clinical/Bedside Swallow Evaluation ?Patient Details  ?Name: Ricky [name] ?MRN: [id number] ?Date of Birth: 1955-06-06 ? ?Today's Date: 05/17/2021 ?Time: SLP Start Time (ACUTE ONLY): 2951 SLP Stop Time (ACUTE ONLY): 1511 ?SLP Time Calculation (min) (ACUTE ONLY): 12 min ? ?Past Medical History:  ?Past Medical History:  ?Diagnosis Date  ? Human immunodeficiency virus (HIV) disease ([place]) 03/26/2020  ? ?Past Surgical History:  ?Past Surgical History:  ?Procedure Laterality Date  ? IR FL GUIDED LOC OF NEEDLE/CATH TIP FOR SPINAL INJECTION LT  10/17/2020  ? SVT ABLATION N/A 04/23/2020  ? Procedure: SVT ABLATION;  Surgeon: Evans Lance, MD;  Location: [facility];  Service: Cardiovascular;  Laterality: N/A;  ? ?HPI:  ?66 y/o male admitted on 05/09/21 after recent lymph node biopsy that shows disseminated nocardia infection. Not a candidate for VATS/excisional biopsy of mediastinal adenopathy. PMH: HIV/AIDS, hx of cryptococcal meningitis, GERD, protein-calorie malnutritio, chronic COVID, hx of TB with chronic cavitary lung disease, substance abuse, generalized lymphadenopath. BSE 4/10 with adequate oropharyngeal phase swallow and regular thin recommended.  ?  ?Assessment / Plan / Recommendation  ?Clinical Impression ? ST reconsulted due to pt having difficulty swallowing pill with RN with facial grimacing earlier today. He has mild general weakness for labial-lingual movements and difficult to view dentition- states he is missing some lower dentition. His mastication was reflective of that with a reduced rotary pattern with mildly prolonged bolus prep and transit. He had a delayed cough after one of seven sips water not concerning for aspiration. Pt did exhbit facial grimacing with most swallows but denied odonophagia x 3. He states he has difficulty taking pills. Recommend and pt agreeable to texture downgrade to Dys 3, continue thin. RN stated he sometimes refuses meds and may further refuse if meds are crushed-  recommended to give pills in puree and if he has difficulty then crush at that time. Sit upright for meals and remain upright after. No further ST indicated at this time. ?SLP Visit Diagnosis: Dysphagia, unspecified (R13.10) ?   ?Aspiration Risk ? Mild aspiration risk  ?  ?Diet Recommendation Dysphagia 3 (Mech soft);Thin liquid  ? ?Liquid Administration via: Straw;Cup ?Medication Administration: Whole meds with puree ?Supervision: Patient able to self feed;Intermittent supervision to cue for compensatory strategies ?Compensations: Slow rate;Small sips/bites ?Postural Changes: Seated upright at 90 degrees  ?  ?Other  Recommendations Oral Care Recommendations: Oral care BID   ? ?Recommendations for follow up therapy are one component of a multi-disciplinary discharge planning process, led by the attending physician.  Recommendations may be updated based on patient status, additional functional criteria and insurance authorization. ? ?Follow up Recommendations No SLP follow up  ? ? ?  ?Assistance Recommended at Discharge None  ?Functional Status Assessment    ?Frequency and Duration    ?  ?  ?   ? ?Prognosis    ? ?  ? ?Swallow Study   ?General Date of Onset: 05/09/21 ?HPI: 66 y/o male admitted on 05/09/21 after recent lymph node biopsy that shows disseminated nocardia infection. Not a candidate for VATS/excisional biopsy of mediastinal adenopathy. PMH: HIV/AIDS, hx of cryptococcal meningitis, GERD, protein-calorie malnutritio, chronic COVID, hx of TB with chronic cavitary lung disease, substance abuse, generalized lymphadenopath. BSE 4/10 with adequate oropharyngeal phase swallow and regular thin recommended. ?Type of Study: Bedside Swallow Evaluation ?Previous Swallow Assessment:  (see HPI) ?Diet Prior to this Study: Regular;Thin liquids ?Temperature Spikes Noted: No ?Respiratory Status: Room air ?History of Recent Intubation: No ?Behavior/Cognition: Alert;Cooperative ?Oral  Cavity Assessment: Within Functional  Limits ?Oral Care Completed by SLP: No ?Oral Cavity - Dentition: Other (Comment) (pt states missing some lower- difficult to view) ?Vision: Functional for self-feeding ?Self-Feeding Abilities: Able to feed self ?Patient Positioning: Upright in bed ?Baseline Vocal Quality: Normal ?Volitional Cough: Strong ?Volitional Swallow: Able to elicit  ?  ?Oral/Motor/Sensory Function Overall Oral Motor/Sensory Function: Generalized oral weakness   ?Ice Chips Ice chips: Not tested   ?Thin Liquid Thin Liquid: Impaired ?Pharyngeal  Phase Impairments: Cough - Delayed  ?  ?Nectar Thick Nectar Thick Liquid: Not tested   ?Honey Thick Honey Thick Liquid: Not tested   ?Puree Puree: Within functional limits   ?Solid ? ? ?  Solid: Impaired ?Oral Phase Impairments: Impaired mastication ?Oral Phase Functional Implications: Prolonged oral transit  ? ?  ? ?Houston Siren ?05/17/2021,3:37 PM ? ? ? ?

## 2021-05-18 DIAGNOSIS — B451 Cerebral cryptococcosis: Secondary | ICD-10-CM | POA: Diagnosis not present

## 2021-05-18 DIAGNOSIS — B20 Human immunodeficiency virus [HIV] disease: Secondary | ICD-10-CM | POA: Diagnosis not present

## 2021-05-18 DIAGNOSIS — U071 COVID-19: Secondary | ICD-10-CM | POA: Diagnosis not present

## 2021-05-18 DIAGNOSIS — A312 Disseminated mycobacterium avium-intracellulare complex (DMAC): Secondary | ICD-10-CM | POA: Diagnosis not present

## 2021-05-18 LAB — COMPREHENSIVE METABOLIC PANEL
ALT: 32 U/L (ref 0–44)
AST: 70 U/L — ABNORMAL HIGH (ref 15–41)
Albumin: <1.5 g/dL — ABNORMAL LOW (ref 3.5–5.0)
Alkaline Phosphatase: 135 U/L — ABNORMAL HIGH (ref 38–126)
Anion gap: 2 — ABNORMAL LOW (ref 5–15)
BUN: 13 mg/dL (ref 8–23)
CO2: 19 mmol/L — ABNORMAL LOW (ref 22–32)
Calcium: 7.7 mg/dL — ABNORMAL LOW (ref 8.9–10.3)
Chloride: 113 mmol/L — ABNORMAL HIGH (ref 98–111)
Creatinine, Ser: 0.96 mg/dL (ref 0.61–1.24)
GFR, Estimated: >60 mL/min (ref >60)
Glucose, Bld: 78 mg/dL (ref 70–99)
Potassium: 4.2 mmol/L (ref 3.5–5.1)
Sodium: 134 mmol/L — ABNORMAL LOW (ref 135–145)
Total Bilirubin: 1.9 mg/dL — ABNORMAL HIGH (ref 0.3–1.2)
Total Protein: 6 g/dL — ABNORMAL LOW (ref 6.5–8.1)

## 2021-05-18 MED ORDER — HYDROCOD POLI-CHLORPHE POLI ER 10-8 MG/5ML PO SUER
5.0000 mL | Freq: Two times a day (BID) | ORAL | Status: DC
  Administered 2021-05-18 – 2021-05-21 (×8): 5 mL via ORAL
  Filled 2021-05-18 (×8): qty 5

## 2021-05-18 MED ORDER — SODIUM CHLORIDE 0.9 % IV SOLN: INTRAVENOUS | Status: DC

## 2021-05-18 MED ORDER — SODIUM CHLORIDE 0.9 % IV SOLN
INTRAVENOUS | Status: AC
Start: 2021-05-18 — End: 2021-05-19

## 2021-05-18 NOTE — Progress Notes (Signed)
?                                                   ?Daily Progress Note  ? ?Patient Name: Ricky [name]       Date: 05/18/2021 ?DOB: [birth year]  Age: 66 y.o. MRN#: [id number] ?Attending Physician: Deatra James, MD ?Primary Care Physician: Pcp, No ?Admit Date: 05/09/2021 ? ?Reason for Consultation/Follow-up: Establishing goals of care ? ?Subjective: ?Seems more alert today but still minimally interactive - to my questions he utters mostly unintelligible words/grunts - tells me he didn't eat breakfast for lunch but is drinking ensure, took his pills, no specific complaints just wants to sleep ? ?Length of Stay: 9 ? ?Current Medications: ?Scheduled Meds:  ? vitamin C  500 mg Oral Daily  ? azithromycin  500 mg Oral Daily  ? chlorpheniramine-HYDROcodone  5 mL Oral Q12H  ? dolutegravir  50 mg Oral Daily  ? emtricitabine-tenofovir  1 tablet Oral Daily  ? enoxaparin (LOVENOX) injection  40 mg Subcutaneous Q24H  ? ethambutol  1,000 mg Oral Daily  ? famotidine  20 mg Oral BID  ? feeding supplement  237 mL Oral TID BM  ? fluconazole  200 mg Oral Daily  ? folic acid  1 mg Oral Daily  ? megestrol  400 mg Oral Daily  ? mirtazapine  15 mg Oral QHS  ? ondansetron  4 mg Oral Q12H  ? pantoprazole  20 mg Oral Daily  ? rifabutin  300 mg Oral Daily  ? sennosides  5 mL Oral BID  ? sulfamethoxazole-trimethoprim  1 tablet Oral Daily  ? zinc sulfate  220 mg Oral Daily  ? ? ?Continuous Infusions: ? sodium chloride 100 mL/hr at 05/18/21 1139  ? ? ?PRN Meds: ?acetaminophen **OR** acetaminophen, chlorpheniramine-HYDROcodone, guaiFENesin-dextromethorphan, labetalol, magnesium hydroxide, ondansetron **OR** ondansetron (ZOFRAN) IV, oxyCODONE ? ?Physical Exam ?Constitutional:   ?   General: He is not in acute distress. ?   Appearance: He is ill-appearing.  ?   Comments: Lethargic though more alert than  yesterday  ?Pulmonary:  ?   Effort: Pulmonary effort is normal.  ?Skin: ?   General: Skin is warm and dry.  ?Neurological:  ?   Comments: UTA  ?         ? ?Vital Signs: BP 130/90 (BP Location: Left Arm)   Pulse (!) 105   Temp 98.9 ?F (37.2 ?C)   Resp 20   Ht _0  (1.854 m)   Wt 63.6 kg   SpO2 98%   BMI 18.50 kg/m?  ?SpO2: SpO2: 98 % ?O2 Device: O2 Device: Room Air ?O2 Flow Rate:   ? ?Intake/output summary:  ?Intake/Output Summary (Last 24 hours) at 05/18/2021 1613 ?Last data filed at 05/18/2021 0900 ?Gross per 24 hour  ?Intake 200 ml  ?Output 250 ml  ?Net -50 ml  ? ?LBM: Last BM Date : 05/15/21 ?Baseline Weight: Weight: 63.6 kg ?Most recent weight: Weight: 63.6 kg ? ?     ?Palliative Assessment/Data: PPS 30% ? ? ? ?Flowsheet Rows   ? ?Flowsheet Row Most Recent Value  ?Intake Tab   ?Referral Department Hospitalist  ?Unit at Time of Referral Cardiac/Telemetry Unit  ?Palliative Care Primary Diagnosis Sepsis/Infectious Disease  ?Date Notified 05/17/21  ?Palliative Care Type New Palliative care  ?Reason for referral Clarify Goals of Care  ?Date of  Admission 05/09/21  ?Date first seen by Palliative Care 05/17/21  ?# of days Palliative referral response time 0 Day(s)  ?# of days IP prior to Palliative referral 8  ?Clinical Assessment   ?Psychosocial & Spiritual Assessment   ?Palliative Care Outcomes   ? ?  ? ? ?Patient Active Problem List  ? Diagnosis Date Noted  ? Thrombocytopenia ([place]) 05/15/2021  ? Hyponatremia 05/10/2021  ? GERD without esophagitis 05/10/2021  ? Protein-calorie malnutrition, severe 05/10/2021  ? 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 Bladder wall thickening 04/08/2021  ? Abdominal pain 04/08/2021  ? Normocytic anemia 04/08/2021  ? Nausea with vomiting 04/08/2021  ? Fever   ? Acute cholecystitis 01/22/2021  ? Complicated UTI (urinary tract infection) 01/22/2021  ? Noncompliance 01/22/2021  ? Lymphadenopathy   ? Chronic low back pain   ? HIV infection ([place])   ? Community acquired pneumonia   ?  Nonadherence to medical treatment   ? COVID-19 virus infection 10/13/2020  ? Alcohol abuse, daily use   ? COVID-19 10/12/2020  ? 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 Blurry vision, left eye   ? AKI (acute kidney injury) ([place])   ? Cavitary lesion of lung   ? Acute nonintractable headache   ? Smoking   ? Cocaine use   ? Paroxysmal SVT (supraventricular tachycardia) ([facility])   ? Abnormal EKG   ? Atrial flutter ([place]) 03/27/2020  ? Aortic atherosclerosis ([place]) 03/27/2020  ? Hypertension 03/27/2020  ? Cryptococcal meningitis ([place]) 03/26/2020  ? Leukocytopenia 03/26/2020  ? Macrocytosis 03/26/2020  ? Moderate protein malnutrition ([place]) 03/26/2020  ? Hypoalbuminemia 03/26/2020  ? AIDS (acquired immune deficiency syndrome) ([place]) 03/26/2020  ? ? ?Palliative Care Assessment & Plan  ? ?HPI: ?66 y.o. 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  Plan was for discharge home 4/14 with home health however that day patient appeared weaker and was unable to take oral medications.  Palliative medicine team consulted to discuss goals of care. ? ?Assessment: ?Attempted to discuss goals of care with patient however he was minimally interactive.  He did seem more alert to me than yesterday but with multiple attempts to engage him in conversation he continued to give minimal response.  No family at bedside during my  visit. ? ?Called patient's brother Ricky [name] with whom I spoke with yesterday.  We reviewed her conversation from yesterday and he tells me he heard patient was doing better today because he took his medications this morning.  We discussed that though he did take medications and is a bit more alert this morning the big picture remains the same as it was during our conversation yesterday.  I ask him if he reviewed the topics we discussed yesterday with any other family members such as repeat hospitalizations, CODE STATUS, hospice but he tells me he has not had to chance it.  I ask if he is available to meet today for further discussion but he tells me he is out of town today and will not be  at the hospital until this evening. ? ?Brother does share they have decided that they want the patient to go home with a cousin and she will care for him as she does not work.  I expressed that the patient would be eligible for extra support in the home such as hospice but the brother is unsure about this. ? ?Recommendations/Plan: ?Goals remain unclear as patient is not engaging in goals of care discussions and brother is hesitant to make decisions and has not discussed with other family members ?We again discussed option of hospice but no decision was made ?Brother would like patient to be discharged home with their cousin ?Further goals of care discussions are needed though I do realize patient is stable for discharge, if patient is discharged prior to further goals of care discussions please consult transitions of care for outpatient palliative referral ? ?Code Status: ?Full code ? ?Prognosis: ?Poor prognosis, likely eligible for hospice services ? ?Discharge Planning: ?Family would like patient discharged to cousin's home though it is unclear what type of support they would like at home such as hospice versus home health ? ?Care plan was discussed with patient's brother Ricky [name] ? ?Thank you for allowing the Palliative Medicine Team  to assist in the care of this patient. ? ?*Please note that this is a verbal dictation therefore any spelling or grammatical errors are due to the "[place] [employer]" system interpretation. ? ?Juel Burrow, DNP, AGNP

## 2021-05-18 NOTE — Progress Notes (Signed)
?PROGRESS NOTE ? ? ? ?Patient: Ricky [name]                            PCP: Pcp, No                    ?DOB: [birth year]            DOA: 05/09/2021 ?WUJ:[id number]             DOS: 05/18/2021, 12:39 PM ? ? LOS: 9 days  ? ?Date of Service: The patient was seen and examined on 05/18/2021 ? ?Subjective:  ? ?The patient was seen and examined this morning, more awake alert, status post evaluation by speech, has been placed on dysphagia 3 mechanical soft diet with thin fluids. ?This morning stating he does not like the smell of the food.  Was complaining of cough ?Otherwise hemodynamically stable ?No issues overnight ? ?Brief Narrative:  ? 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 ?Patient was admitted for further work-up being treated for sepsis due to MAI disseminated infection, bulky adenopathy concerning for lymphoproliferative cancer, severe symptomatic anemia likely from acute illness profound infection, along with a comfortable meningitis ARDS Nocardia infection hyponatremia COVID-19 positive status, GERD. ? ?Per report -Noncompliant with abx/HIV meds for weeks(months?) --> ID following on abx - abnormal lymphadenopathy - outpt PET scan abnormal - trying to coordinate biopsy with ID>Sx>Pulm>Oncology --> basically down to CT Sx at this point for mediastinal node biopsy and they feels he is not a candidate for  VATS/ Exciisional biopsy of metastatic adenopathy.  ? ? ? ?Assessment & Plan:  ? ?Principal Problem: ?  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  Nocardia infection ?  COVID-19 ?  Hyponatremia ?   Cryptococcal meningitis ([place]) ?  AIDS (acquired immune deficiency syndrome) ([place]) ?  Leukocytopenia ?  Normocytic anemia ?  GERD without esophagitis ?  Protein-calorie malnutrition, severe ?  Thrombocytopenia ([place]) ? ? ?Abnormal lymphadenopathy:  ?-No further work-up per subspecialist ?- Coordinate biopsy with ID>Sx>Pulm>Oncology.  CT surgery increased for possible biopsy but they felt he is not a candidate for VATS/excisional mediastinal biopsy, no current plan for excisional biopsy, per discussion by ID w pulmonary EBUS with biopsies possible, currently no plan to pursue any further biopsies. ?Lymphoma is still on the differential.moving forward as per ID plan is to cont MAC therapy 3 drug regimen started 04/04/2021.    ?  ?AIDS-HIV:  ?Continue to decline WBC trending down ?Severe cachexia muscle wasting  ?Continue current HIV medication as ordered per ID along with Bactrim and fluconazole prophylaxis  ?  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  ?Chronic COVID:Recurrence of infection is likely secondary to immunosuppression.  CT 20.4-44 is cut off, plan is for molnupiravir x6 D, he has no respiratory symptoms. ?  ?Anemia normocytic likely from anemia of chronic disease.   ?Received 1 unit PRBC 4/11.  Hemoglobin improved. Monitor ?Thrombocytopenia monitor likely from HIV ?Leukopenia mild -monitor ?  ?Hyponatremia: Improving on normal saline, cut down the rate, ENCOURAGE PO ?  ?Dysphagia -status post evaluation by speech, recommending dysphagia 3 modified mechanical  soft diet with ? ? ?GERD without esophagitis:continue PPI. ? ?  ?History of substance abuse reported recently use drugs denies IVDU ?Lower leg edema from hypoalbuminemia, stop IV fluids, added TED hose. ?  ?Deconditioning/debility: ?Patient is weak and frail seen by PT recommended skilled nursing facility, patient reports he is unable to take care of himself.  TOC consult for placement ? ? ? ?Failure to thrive  ?Body mass  index is 18.5 kg/m?Marland Kitchen ?with dysphagia, loss of appetite, encouraging p.o. intake, dietary supplements, ?On Remeron, despite all measures patient is deteriorating with muscle wasting, weakness ?  ?Severe malnutrition augment diet as below.  Add Megace to increase appetite. ?Nutrition Problem: Severe Malnutrition ?Etiology: chronic illness (HIV) ?Signs/Symptoms: severe muscle depletion, severe fat depletion, energy intake < 75% for > or equal to 1 month, percent weight loss ?Percent weight loss: 17.5 % (x2 months) ?Interventions: Ensure Enlive (each supplement provides 350kcal and 20 grams of protein), Liberalize Diet ?  ?------------------------------------------------------------------------------------------------------------------------------- ?Nutritional status:  ?The patient's BMI is: Body mass index is 18.5 kg/m?. ?I agree with the assessment and plan as outlined below: ?Nutrition Status: ?Nutrition Problem: Severe Malnutrition ?Etiology: chronic illness (HIV) ?Signs/Symptoms: severe muscle depletion, severe fat depletion, energy intake < 75% for > or equal to 1 month, percent weight loss ?Percent weight loss: 17.5 % (x2 months) ?Interventions: Ensure Enlive (each supplement provides 350kcal and 20 grams of protein), Liberalize Diet ? ? ? ?DVT prophylaxis:  ?Place TED hose Start: 05/16/21 0932 ?enoxaparin (LOVENOX) injection 40 mg Start: 05/10/21 1800 ? ?Code Status:   Code Status: Full Code ? ? ? ?Family Communication:Plan of care discussed with patient/ Brother on the phone 4/14. Called Federick Levene his sister, she is agreeable for SNF.  ? ?-Palliative care consulted -and ongoing discussion regarding goals of care, CODE STATUS ? ?Overall due to above findings, multiple comorbidities, continued decline mentally and physically -patient is more likely hospice appropriate ? ? ? ? ?Admission status:   ?Status is: Inpatient ?Disposition: Anticipating SNF -with hopes that patient and family would accept hospice  --ready to be discharged  ? ? ? ? ? ?Procedures:  ? ?No admission procedures for hospital encounter. ? ? ?Antimicrobials:  ?Anti-infectives (From admission, onward)  ? ? Start     Dose/Rate Route Frequency Ordered Stop  ? 05/11/21 1400  fluconazole (DIFLUCAN) tablet 200 mg       ? 200 mg Oral Daily 05/11/21 1303    ? 05/11/21 1000  emtricitabine-tenofovir (TRUVADA) 200-300 MG per tablet 1 tablet       ? 1 tablet Oral Daily 05/10/21 1320    ? 05/10/21 1000  azithromycin (ZITHROMAX) tablet 500 mg       ? 500 mg Oral Daily 05/09/21 2237    ? 05/10/21 1000  dolutegravir (TIVICAY) tablet 50 mg       ? 50 mg Oral Daily 05/09/21 2237    ? 05/10/21 1000  emtricitabine-tenofovir AF (DESCOVY) 200-25 MG per tablet 1 tablet  Status:  Discontinued       ? 1 tablet Oral Daily 05/09/21 2237 05/10/21 1320  ? 05/10/21 1000  ethambutol (MYAMBUTOL) tablet 1,000 mg       ? 1,000 mg Oral Daily 05/09/21 2237    ? 05/10/21 1000  sulfamethoxazole-trimethoprim (BACTRIM) 400-80 MG per tablet 1 tablet       ? 1 tablet Oral Daily 05/09/21 2237    ? 05/10/21 1000  rifabutin (MYCOBUTIN) capsule 300 mg       ? 300 mg  Oral Daily 05/09/21 2237    ? 05/10/21 1000  molnupiravir EUA (LAGEVRIO) capsule 800 mg       ? 4 capsule Oral 2 times daily 05/10/21 0350 05/14/21 2235  ? ?  ? ? ? ?Medication:  ? vitamin C  500 mg Oral Daily  ? azithromycin  500 mg Oral Daily  ? chlorpheniramine-HYDROcodone  5 mL Oral Q12H  ? dolutegravir  50 mg Oral Daily  ? emtricitabine-tenofovir  1 tablet Oral Daily  ? enoxaparin (LOVENOX) injection  40 mg Subcutaneous Q24H  ? ethambutol  1,000 mg Oral Daily  ? famotidine  20 mg Oral BID  ? feeding supplement  237 mL Oral TID BM  ? fluconazole  200 mg Oral Daily  ? folic acid  1 mg Oral Daily  ? megestrol  400 mg Oral Daily  ? mirtazapine  15 mg Oral QHS  ? ondansetron  4 mg Oral Q12H  ? pantoprazole  20 mg Oral Daily  ? rifabutin  300 mg Oral Daily  ? sennosides  5 mL Oral BID  ? sulfamethoxazole-trimethoprim  1 tablet Oral  Daily  ? zinc sulfate  220 mg Oral Daily  ? ? ?acetaminophen **OR** acetaminophen, chlorpheniramine-HYDROcodone, guaiFENesin-dextromethorphan, labetalol, magnesium hydroxide, ondansetron **OR** ondansetron (ZOFR

## 2021-05-18 NOTE — Plan of Care (Signed)
  Problem: Activity: Goal: Risk for activity intolerance will decrease Outcome: Not Progressing   

## 2021-05-19 DIAGNOSIS — D72819 Decreased white blood cell count, unspecified: Secondary | ICD-10-CM

## 2021-05-19 DIAGNOSIS — Z789 Other specified health status: Secondary | ICD-10-CM

## 2021-05-19 DIAGNOSIS — E43 Unspecified severe protein-calorie malnutrition: Secondary | ICD-10-CM

## 2021-05-19 DIAGNOSIS — B20 Human immunodeficiency virus [HIV] disease: Secondary | ICD-10-CM | POA: Diagnosis not present

## 2021-05-19 DIAGNOSIS — Z515 Encounter for palliative care: Secondary | ICD-10-CM

## 2021-05-19 DIAGNOSIS — D649 Anemia, unspecified: Secondary | ICD-10-CM | POA: Diagnosis not present

## 2021-05-19 DIAGNOSIS — A312 Disseminated mycobacterium avium-intracellulare complex (DMAC): Secondary | ICD-10-CM | POA: Diagnosis not present

## 2021-05-19 DIAGNOSIS — B451 Cerebral cryptococcosis: Secondary | ICD-10-CM | POA: Diagnosis not present

## 2021-05-19 DIAGNOSIS — D696 Thrombocytopenia, unspecified: Secondary | ICD-10-CM

## 2021-05-19 DIAGNOSIS — U071 COVID-19: Secondary | ICD-10-CM | POA: Diagnosis not present

## 2021-05-19 LAB — BASIC METABOLIC PANEL
Anion gap: 3 — ABNORMAL LOW (ref 5–15)
BUN: 15 mg/dL (ref 8–23)
CO2: 19 mmol/L — ABNORMAL LOW (ref 22–32)
Calcium: 7.8 mg/dL — ABNORMAL LOW (ref 8.9–10.3)
Chloride: 112 mmol/L — ABNORMAL HIGH (ref 98–111)
Creatinine, Ser: 1.05 mg/dL (ref 0.61–1.24)
GFR, Estimated: >60 mL/min (ref >60)
Glucose, Bld: 72 mg/dL (ref 70–99)
Potassium: 4.3 mmol/L (ref 3.5–5.1)
Sodium: 134 mmol/L — ABNORMAL LOW (ref 135–145)

## 2021-05-19 NOTE — Progress Notes (Signed)
Pt is very irritable. This RN tried to reposition pt due to family stating he was swollen and needed his legs and arm elevated. Pt stated he did not want to be moved and insisted that this RN and charge RN move pt back down in the bed after we lifted him up. Pt was irritated with RN asking if he needed anything and if he wanted the light out. Pt would make noises and blow with everything this RN would ask or say. RN gave medications and told pt that if he needed anything else to please call that this RN would let him get some rest. Pt is resting comfortably in bed at this time. RN will continue to monitor.  ? ?Ricky [name] ? ?

## 2021-05-19 NOTE — Progress Notes (Signed)
?PROGRESS NOTE ? ? ? ?Patient: Ricky [name]                            PCP: Pcp, No                    ?DOB: [birth year]            DOA: 05/09/2021 ?TFT:[id number]             DOS: 05/19/2021, 3:56 PM ? ? LOS: 10 days  ? ?Date of Service: The patient was seen and examined on 05/19/2021 ? ?Subjective:  ? ?Seen along with palliative care NP. Pt is frustrated but contradictory. Taking some medications and not others. At one point says something to the effect of "that's why I'm here is to take medicine and get better, so give me medicine." But also states the medicines aren't helping and he only feels bad so he doesn't take medicines.   ? ?Brief Narrative:  ? 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 ?Patient was admitted for further work-up being treated for sepsis due to MAI disseminated infection, bulky adenopathy concerning for lymphoproliferative cancer, severe symptomatic anemia likely from acute illness profound infection, along with a comfortable meningitis ARDS Nocardia infection hyponatremia COVID-19 positive status, GERD. ? ?Per report -Noncompliant with abx/HIV meds for weeks(months?) --> ID following on abx - abnormal lymphadenopathy - outpt PET scan abnormal - trying to coordinate biopsy with ID>Sx>Pulm>Oncology --> basically down to CT Sx at this point for mediastinal node biopsy and they feels he is not a candidate for  VATS/ Exciisional biopsy of metastatic adenopathy.  ? ?Assessment & Plan:  ? ?Principal Problem: ?  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  Nocardia infection ?  COVID-19 ?   Hyponatremia ?  Cryptococcal meningitis ([place]) ?  AIDS (acquired immune deficiency syndrome) ([place]) ?  Leukocytopenia ?  Normocytic anemia ?  GERD without esophagitis ?  Protein-calorie malnutrition, severe ?  Thrombocytopenia ([place]) ? ?Abnormal lymphadenopathy:  ?- No further work-up per subspecialist ?- Coordinate biopsy with ID>Sx>Pulm>Oncology.  CT surgery increased for possible biopsy but they felt he is not a candidate for VATS/excisional mediastinal biopsy, no current plan for excisional biopsy, per discussion by ID w pulmonary EBUS with biopsies possible, currently no plan to pursue any further biopsies. ?Lymphoma is still on the differential.moving forward as per ID plan is to cont MAC therapy 3 drug regimen started 04/04/2021.    ?  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  ?Continue to decline WBC trending down ?Severe cachexia muscle wasting  ?- Continue current HIV medication as ordered per ID along with Bactrim and fluconazole prophylaxis  ?  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 CT 20.4 suggesting failure to have cleared initial infection. Now s/p molnupiravir  4/7 - 4/11. No attributable symptoms at this time.  ?  ?Anemia of chronic disease: Worsened by acute illness, s/p 1 unit PRBC 4/11.  Hemoglobin stabilized in 8's. Suggest recheck at follow up.  ? ?Thrombocytopenia: Stable. At this level, can continue VTE  ppx.  ?  ?Hyponatremia: Improved and now stable off IVF.  ?  ?Dysphagia: Continue dysphagia 3 diet recommended by SLP.   ? ?GERD: Continue PPI. ?  ?History of substance abuse: Denies IVDU ? ?Lower leg edema from hypoalbuminemia. Compression, elevation.  ?  ?Deconditioning/debility: ?Patient is weak and frail seen by PT recommended skilled nursing facility, patient reports he is unable to take care of himself. Requires rehabilitation, TOC consulted and patient was discharged pending  bed availability to SNF. ? ?Failure to thrive  ?Body mass index is 18.5 kg/m?Marland Kitchen ?with dysphagia, loss of appetite, encouraging p.o. intake, dietary supplements, ?On Remeron, despite all measures patient is deteriorating with muscle wasting, weakness ?  ?Severe malnutrition:  ?- Continue megace and liberalized diet.  ?  ?Family Communication: None at bedside. Discussed plan of care with palliative care who is point person at this time and held family meeting today. Plan is to move forward with SNF. Pt remains full code. ? ?Overall due to above findings, multiple comorbidities, continued decline mentally and physically -patient is more likely hospice appropriate ? ?Admission status:   ?Status is: Inpatient ?Disposition: Stable for discharge to SNF with palliative care. Alternative recommendation by medical team for home under hospice care has been declined by family.  ? ?Objective: ?BP 113/82 (BP Location: Right Arm)   Pulse 99   Temp 97.7 ?F (36.5 ?C) (Oral)   Resp 17   Ht _0  (1.854 m)   Wt 63.6 kg   SpO2 98%   BMI 18.50 kg/m?   ?Gen: chronically ill-appearing male in no distress ?Pulm: Nonlabored breathing Clear. ?CV: Regular rate and rhythm. No murmur, rub, or gallop. No JVD, 2+ pitting LE edema. ?GI: Abdomen soft, non-tender, non-distended, with normoactive bowel sounds.  ?Ext: Warm, no deformities ?Skin: No rashes, lesions or ulcers on visualized skin. ?Neuro: Alert and oriented to person and place, cannot follow complex questions or reasoning. ?Psych: Irritable but calm. ? ? ?Patrecia Pour, MD  ?[email or web address] ?05/19/2021, 3:56 PM ? ? ? ? ?

## 2021-05-19 NOTE — Progress Notes (Signed)
?                                                   ?Palliative Care Progress Note, Assessment & Plan  ? ?Patient Name: Ricky [name]       Date: 05/19/2021 ?DOB: March 14, 1955  Age: 66 y.o. MRN#: [id number] ?Attending Physician: Patrecia Pour, MD ?Primary Care Physician: Pcp, No ?Admit Date: 05/09/2021 ? ?Reason for Consultation/Follow-up: Establishing goals of care ? ?Subjective: ?Patient is resting in bed in no apparent distress.  Breakfast tray is at bedside and untouched.  Patient denies pain and has no current complaints. ? ?HPI: ?66 y.o. 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  Plan was for discharge home 4/14 with home health however that day patient appeared weaker and was unable to take oral medications.  Palliative medicine team consulted to discuss goals of care. ? ?Summary of counseling/coordination of care: ?After reviewing the patient's chart and assessing the patient at bedside, I met with patient at bedside.  Dr. Bonner Puna was part of our discussion as well.  Patient was disinterested in speaking about goals of care.  Patient is easily agitated when asked to many questions. Medical plan of care reviewed by Dr. Bonner Puna. Patient reports he does not want to take medications but  takes them since he knows he needs them to get better. ? ?After meeting with the patient and Dr. Bonner Puna this AM, I spoke with patient's brother [name] who plans to be in the hospital today at 130. ? ?I met with patient's brother [name] and brother Ozzie Hoyle at bedside today at 78.  [name] is familiar with palliative medicine as he has met with my colleague NP Kathie Rhodes previously.  ? ?We discussed code status, home with hospice, comfort measures, aggressive medical treatment, and rehab with palliative outpatient services in detail. ? ?I attempted to elicit goals important to the patient. Patient was minimally participatory during this discussion but would nod his head and say "yeah" in agreement with his brothers.  ? ?Both [name] and [place] shared that patient would likely want to go to rehab, get stronger, and work on him being able to walk again. They would like to have him placed in a rehab facility near them so they can visit him. They are also in agreement with outpatient palliative services to follow. ? ?In the event of a cardiopulmonary arrest, patient and both brothers are in agreement with full code.  Brother Ozzie Hoyle shared that he would want everything to be done to see if patient could be revived at least once. When asked if patient would want CPR to be attempted and to be placed on a ventilator, pt said he thought that was already the plan.  ? ?Ozzie Hoyle also shred and patient nodded in agreement that if pt is on a ventilator then at that time they would make  the decision whether or not to continue with aggressive medical treatment.  Patient was in agreement with remaining full CODE STATUS. ? ?Questions and concerns were addressed.  PMT will continue to shadow the patient's chart and intervene if goals change, patient's health status declines, or at family's requests. ? ?Code Status: ?Full code ? ?Prognosis: ?Unable to determine ? ?Discharge Planning: ?[place] for rehab with Palliative care service  follow-up ? ?Care plan was discussed with patient, patient's brothers [name] and [place], RN Gwenlyn Perking, [place]. Bonner Puna ? ?Physical Exam ?Vitals and nursing note reviewed.  ?Constitutional:   ?   General: He is not in acute distress. ?   Appearance: He is not toxic-appearing.  ?HENT:  ?   Head:  ?   Comments: Severe temporal wasting ?   Mouth/Throat:  ?   Mouth: Mucous membranes are moist.  ?   Comments: Poor dentition ?Cardiovascular:  ?   Rate and Rhythm: Normal rate.  ?   Pulses: Normal pulses.  ?Pulmonary:  ?   Effort: Pulmonary effort is normal.  ?Abdominal:  ?   Palpations: Abdomen is soft.  ?Musculoskeletal:  ?   Comments: Bilateral LE weakness  ?Skin: ?   General: Skin is warm.  ?   Comments: +1 non pitting edema of bilateral LEs  ?Neurological:  ?   Mental Status: He is alert. Mental status is at baseline.  ?Psychiatric:     ?   Mood and Affect: Mood normal.     ?   Behavior: Behavior normal.  ?         ? ?Palliative Assessment/Data: 40% ? ? ? ?Total Time 50 minutes  ?Greater than 50%  of this time was spent counseling and coordinating care related to the above assessment and plan. ? ?Thank you for allowing the Palliative Medicine Team to assist in the care of this patient. ? ?Verdell Carmine. Willem Klingensmith, DNP, FNP-BC ?Palliative Medicine Team ?Team Phone # [phone] ?  ?

## 2021-05-20 DIAGNOSIS — U071 COVID-19: Secondary | ICD-10-CM | POA: Diagnosis not present

## 2021-05-20 DIAGNOSIS — B20 Human immunodeficiency virus [HIV] disease: Secondary | ICD-10-CM | POA: Diagnosis not present

## 2021-05-20 DIAGNOSIS — A312 Disseminated mycobacterium avium-intracellulare complex (DMAC): Secondary | ICD-10-CM | POA: Diagnosis not present

## 2021-05-20 DIAGNOSIS — B451 Cerebral cryptococcosis: Secondary | ICD-10-CM | POA: Diagnosis not present

## 2021-05-20 LAB — BASIC METABOLIC PANEL
Anion gap: 3 — ABNORMAL LOW (ref 5–15)
BUN: 15 mg/dL (ref 8–23)
CO2: 19 mmol/L — ABNORMAL LOW (ref 22–32)
Calcium: 8.1 mg/dL — ABNORMAL LOW (ref 8.9–10.3)
Chloride: 113 mmol/L — ABNORMAL HIGH (ref 98–111)
Creatinine, Ser: 1.09 mg/dL (ref 0.61–1.24)
GFR, Estimated: >60 mL/min (ref >60)
Glucose, Bld: 71 mg/dL (ref 70–99)
Potassium: 4.3 mmol/L (ref 3.5–5.1)
Sodium: 135 mmol/L (ref 135–145)

## 2021-05-20 NOTE — [place] FL2 (Signed)
?[place] MEDICAID FL2 LEVEL OF CARE SCREENING TOOL  ?  ? ?IDENTIFICATION  ?Patient Name: ?Ricky [name] Birthdate: 1955/09/19 Sex: male Admission Date (Current Location): ?05/09/2021  ?[place] and [place] Number: ? [place] ?  Facility and Address:  ?[facility] [place], [place] [address], [place], [place] [address] ?     Provider Number: ?[id number]  ?Attending Physician Name and Address:  ?Patrecia Pour, MD ? Relative Name and Phone Number:  ?SEARS, ORAN (Brother)   [phone] ?   ?Current Level of Care: ?Hospital Recommended Level of Care: ?[place] Prior Approval Number: ?  ? ?Date Approved/Denied: ?  PASRR Number: ?[id number] A ? ?Discharge Plan: ?  ?  ? ?Current Diagnoses: ?Patient Active Problem List  ? Diagnosis Date Noted  ? Thrombocytopenia ([place]) 05/15/2021  ? Hyponatremia 05/10/2021  ? GERD without esophagitis 05/10/2021  ? Protein-calorie malnutrition, severe 05/10/2021  ? 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 Bladder wall thickening 04/08/2021  ? Abdominal pain 04/08/2021  ? Normocytic anemia 04/08/2021  ? Nausea with vomiting 04/08/2021  ? Fever   ? Acute cholecystitis 01/22/2021  ? Complicated UTI (urinary tract infection) 01/22/2021  ? Noncompliance 01/22/2021  ? Lymphadenopathy   ? Chronic low back pain   ? HIV infection ([place])   ? Community acquired pneumonia   ? Nonadherence to medical treatment   ? COVID-19 virus infection 10/13/2020  ? Alcohol abuse, daily use   ? COVID-19 10/12/2020  ? 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 Blurry vision, left eye   ? AKI (acute kidney injury) ([place])   ? Cavitary lesion of lung   ? Acute nonintractable headache   ? Smoking   ? Cocaine use   ? Paroxysmal SVT (supraventricular tachycardia) (HCC)   ? Abnormal EKG   ? Atrial flutter ([place]) 03/27/2020  ? Aortic atherosclerosis ([place]) 03/27/2020  ? Hypertension 03/27/2020  ? 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 Leukocytopenia 03/26/2020  ?  Macrocytosis 03/26/2020  ? Moderate protein malnutrition ([place]) 03/26/2020  ? Hypoalbuminemia 03/26/2020  ? AIDS (acquired immune deficiency syndrome) ([place]) 03/26/2020  ? ? ?Orientation RESPIRATION BLADDER Height & Weight   ?  ?Time, Situation, Self, Place ? Normal Continent Weight: 140 lb 3.4 oz (63.6 kg) ?Height:  '6\' 1"'$  (185.4 cm)  ?BEHAVIORAL SYMPTOMS/MOOD NEUROLOGICAL BOWEL NUTRITION STATUS  ?    Continent Diet (see d/c summary)  ?AMBULATORY STATUS COMMUNICATION OF NEEDS Skin   ?Extensive Assist Verbally Normal ?  ?  ?  ?    ?     ?     ? ? ?Personal Care Assistance Level of Assistance  ?Bathing, Dressing, Feeding Bathing Assistance: Maximum assistance ?Feeding assistance: Limited assistance ?Dressing Assistance: Maximum assistance ?   ? ?Functional Limitations Info  ?Sight, Hearing, Speech Sight Info: Adequate ?Hearing Info: Adequate ?Speech Info: Adequate  ? ? ?SPECIAL CARE FACTORS FREQUENCY  ?PT (By licensed PT), OT (By licensed OT)   ?  ?PT Frequency: 5x/ week ?OT Frequency: 5x/ week ?  ?  ?  ?   ? ? ?Contractures Contractures Info: Not present  ? ? ?Additional Factors Info  ?Isolation Precautions, Code Status, Allergies Code Status Info: Full ?Allergies Info: Bactrim ?  ?  ?Isolation Precautions Info: Airborne and Contact precautions ?   ? ?Current Medications (05/20/2021):  This is the current hospital active medication list ?Current Facility-Administered Medications  ?Medication Dose Route Frequency Provider Last Rate Last Admin  ? acetaminophen (TYLENOL) tablet 650 mg  650 mg  Oral Q6H PRN Mansy, Jan A, MD      ? Or  ? acetaminophen (TYLENOL) suppository 650 mg  650 mg Rectal Q6H PRN Mansy, Jan A, MD      ? ascorbic acid (VITAMIN C) tablet 500 mg  500 mg Oral Daily Mansy, Jan A, MD   500 mg at 05/20/21 1056  ? azithromycin (ZITHROMAX) tablet 500 mg  500 mg Oral Daily Mansy, Jan A, MD   500 mg at 05/20/21 1051  ? chlorpheniramine-HYDROcodone 10-8 MG/5ML suspension 5 mL  5 mL Oral Q12H PRN Mansy, Jan A, MD    5 mL at 05/15/21 2137  ? chlorpheniramine-HYDROcodone 10-8 MG/5ML suspension 5 mL  5 mL Oral Q12H Shahmehdi, Seyed A, MD   5 mL at 05/20/21 1050  ? dolutegravir (TIVICAY) tablet 50 mg  50 mg Oral Daily Mansy, Jan A, MD   50 mg at 05/20/21 1045  ? emtricitabine-tenofovir (TRUVADA) 200-300 MG per tablet 1 tablet  1 tablet Oral Daily Rai, Ripudeep K, MD   1 tablet at 05/20/21 1045  ? enoxaparin (LOVENOX) injection 40 mg  40 mg Subcutaneous Q24H Mansy, Jan A, MD   40 mg at 05/19/21 1642  ? ethambutol (MYAMBUTOL) tablet 1,000 mg  1,000 mg Oral Daily Mansy, Jan A, MD   1,000 mg at 05/20/21 1046  ? famotidine (PEPCID) tablet 20 mg  20 mg Oral BID Mansy, Jan A, MD   20 mg at 05/20/21 1051  ? feeding supplement (ENSURE ENLIVE / ENSURE PLUS) liquid 237 mL  237 mL Oral TID BM Rai, Ripudeep K, MD   237 mL at 05/20/21 1040  ? fluconazole (DIFLUCAN) tablet 200 mg  200 mg Oral Daily Shelda Pal, DO   200 mg at 05/20/21 1050  ? folic acid (FOLVITE) tablet 1 mg  1 mg Oral Daily Mansy, Jan A, MD   1 mg at 05/20/21 1050  ? guaiFENesin-dextromethorphan (ROBITUSSIN DM) 100-10 MG/5ML syrup 10 mL  10 mL Oral Q4H PRN Mansy, Jan A, MD   10 mL at 05/14/21 2234  ? labetalol (NORMODYNE) injection 20 mg  20 mg Intravenous Q3H PRN Mansy, Jan A, MD      ? magnesium hydroxide (MILK OF MAGNESIA) suspension 30 mL  30 mL Oral Daily PRN Mansy, Jan A, MD   30 mL at 05/10/21 1328  ? megestrol (MEGACE) 400 MG/10ML suspension 400 mg  400 mg Oral Daily Kc, Ramesh, MD   400 mg at 05/20/21 1050  ? mirtazapine (REMERON) tablet 15 mg  15 mg Oral QHS Mansy, Jan A, MD   15 mg at 05/19/21 2249  ? ondansetron (ZOFRAN) tablet 4 mg  4 mg Oral Q6H PRN Mansy, Jan A, MD      ? Or  ? ondansetron [facility]) injection 4 mg  4 mg Intravenous Q6H PRN Mansy, Jan A, MD      ? ondansetron (ZOFRAN-ODT) disintegrating tablet 4 mg  4 mg Oral Q12H Shahmehdi, Seyed A, MD   4 mg at 05/20/21 1044  ? oxyCODONE (Oxy IR/ROXICODONE) immediate release tablet 5 mg  5 mg Oral Q12H  PRN Little Ishikawa, MD   5 mg at 05/19/21 2002  ? pantoprazole (PROTONIX) EC tablet 20 mg  20 mg Oral Daily Mansy, Jan A, MD   20 mg at 05/20/21 1050  ? rifabutin (MYCOBUTIN) capsule 300 mg  300 mg Oral Daily Mansy, Jan A, MD   300 mg at 05/20/21 1049  ? sennosides (SENOKOT) 8.8 MG/5ML syrup  5 mL  5 mL Oral BID Shahmehdi, Seyed A, MD   5 mL at 05/20/21 1050  ? sulfamethoxazole-trimethoprim (BACTRIM) 400-80 MG per tablet 1 tablet  1 tablet Oral Daily Mansy, Jan A, MD   1 tablet at 05/20/21 1050  ? zinc sulfate capsule 220 mg  220 mg Oral Daily Mansy, Jan A, MD   220 mg at 05/20/21 1051  ? ? ? ?Discharge Medications: ?Please see discharge summary for a list of discharge medications. ? ?Relevant Imaging Results: ? ?Relevant Lab Results: ? ? ?Additional Information ?SSN;  [id number];RFXJOIT COVID-19 VACCINE 07/17/2019; 6'1 140lbs ? ?Paulene Floor Arsenio Schnorr, LCSWA ? ? ? ? ?

## 2021-05-20 NOTE — TOC Progression Note (Signed)
Transition of Care (TOC) - Initial/Assessment Note  ? ? ?Patient Details  ?Name: Ricky [name] ?MRN: [id number] ?Date of Birth: Jun 08, 1955 ? ?Transition of Care (TOC) CM/SW Contact:    ?Paulene Floor Jeb Schloemer, LCSWA ?Phone Number: ?05/20/2021, 2:05 PM ? ?Clinical Narrative:                 ?CSW contacted the patient's brother and provided an update. ? ?The patient does not have any bed offers at this time.  CSW contacted [facility] and requested that they review the patient's referral for admission and is awaiting a response.   ? ?  ?  ? ? ?Patient Goals and CMS Choice ?  ?  ?  ? ?Expected Discharge Plan and Services ?  ?  ?  ?  ?  ?Expected Discharge Date: 05/20/21               ?  ?  ?  ?  ?  ?  ?  ?  ?  ?  ? ?Prior Living Arrangements/Services ?  ?  ?  ?       ?  ?  ?  ?  ? ?Activities of Daily Living ?Home Assistive Devices/Equipment: [place] Rile (specify type) ?ADL Screening (condition at time of admission) ?Patient's cognitive ability adequate to safely complete daily activities?: Yes ?Is the patient deaf or have difficulty hearing?: No ?Does the patient have difficulty seeing, even when wearing glasses/contacts?: No ?Does the patient have difficulty concentrating, remembering, or making decisions?: No ?Patient able to express need for assistance with ADLs?: Yes ?Does the patient have difficulty dressing or bathing?: No ?Independently performs ADLs?: Yes (appropriate for developmental age) ?Does the patient have difficulty walking or climbing stairs?: Yes ?Weakness of Legs: Both ?Weakness of Arms/Hands: Both ? ?Permission Sought/Granted ?  ?  ?   ?   ?   ?   ? ?Emotional Assessment ?  ?  ?  ?  ?  ?  ? ?Admission diagnosis:  Nocardia infection [A43.9] ?Patient Active Problem List  ? Diagnosis Date Noted  ? Thrombocytopenia ([place]) 05/15/2021  ? Hyponatremia 05/10/2021  ? GERD without esophagitis 05/10/2021  ? Protein-calorie malnutrition, severe 05/10/2021  ? 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 Bladder wall thickening  04/08/2021  ? Abdominal pain 04/08/2021  ? Normocytic anemia 04/08/2021  ? Nausea with vomiting 04/08/2021  ? Fever   ? Acute cholecystitis 01/22/2021  ? Complicated UTI (urinary tract infection) 01/22/2021  ? Noncompliance 01/22/2021  ? Lymphadenopathy   ? Chronic low back pain   ? HIV infection ([place])   ? Community acquired pneumonia   ? Nonadherence to medical treatment   ? COVID-19 virus infection 10/13/2020  ? Alcohol abuse, daily use   ? COVID-19 10/12/2020  ? 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 Blurry vision, left eye   ? AKI (acute kidney injury) ([place])   ? Cavitary lesion of lung   ? Acute nonintractable headache   ? Smoking   ? Cocaine use   ? Paroxysmal SVT (supraventricular tachycardia) (HCC)   ? Abnormal EKG   ? Atrial flutter ([place]) 03/27/2020  ? Aortic atherosclerosis ([place]) 03/27/2020  ? Hypertension 03/27/2020  ? 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 Leukocytopenia 03/26/2020  ? Macrocytosis 03/26/2020  ? Moderate protein malnutrition ([place]) 03/26/2020  ? Hypoalbuminemia 03/26/2020  ? AIDS (acquired immune deficiency syndrome) ([place]) 03/26/2020  ? ?PCP:  Pcp, No ?Pharmacy:   ?[id number] [place] [facility] [address] ?[address] ?[place] [address] ?Phone: [phone] Fax: [phone] ? ?[facility] ?[address] ?[place] [address] ?Phone: [phone] Fax: [phone] ? ? ? ? ?Social Determinants of Health (SDOH) Interventions ?  ? ?Readmission Risk Interventions ? ?  10/26/2020  ? 12:55 PM 04/25/2020  ? 10:22 AM  ?Readmission Risk Prevention Plan  ?Transportation Screening Complete Complete  ?PCP or Specialist Appt within 5-7 Days Complete   ?PCP or Specialist Appt within 3-5 Days  Complete  ?Home Care Screening Complete   ?[place] or Home Care Consult  Complete  ?Social Work Consult for [place] Planning/Counseling  Complete  ?Palliative Care Screening  Not  Applicable  ?Medication Review Press photographer)  Complete  ? ? ? ?

## 2021-05-20 NOTE — Progress Notes (Signed)
?PROGRESS NOTE ? ? ? ?Patient: Ricky [name]                            PCP: Pcp, No                    ?DOB: 07-23-1955            DOA: 05/09/2021 ?HQP:[id number]             DOS: 05/20/2021, 4:02 PM ? ? LOS: 11 days  ? ?Date of Service: The patient was seen and examined on 05/20/2021 ? ?Subjective:  ? ?Seen along with palliative care NP. Pt is frustrated but contradictory. Taking some medications and not others. At one point says something to the effect of "that's why I'm here is to take medicine and get better, so give me medicine." But also states the medicines aren't helping and he only feels bad so he doesn't take medicines.   ? ?Brief Narrative:  ? 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 ?Patient was admitted for further work-up being treated for sepsis due to MAI disseminated infection, bulky adenopathy concerning for lymphoproliferative cancer, severe symptomatic anemia likely from acute illness profound infection, along with a comfortable meningitis ARDS Nocardia infection hyponatremia COVID-19 positive status, GERD. ? ?Per report -Noncompliant with abx/HIV meds for weeks(months?) --> ID following on abx - abnormal lymphadenopathy - outpt PET scan abnormal - trying to coordinate biopsy with ID>Sx>Pulm>Oncology --> basically down to CT Sx at this point for mediastinal node biopsy and they feels he is not a candidate for  VATS/ Exciisional biopsy of metastatic adenopathy.  ? ?Assessment & Plan:  ? ?Principal Problem: ?  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  Nocardia infection ?  COVID-19 ?   Hyponatremia ?  Cryptococcal meningitis ([place]) ?  AIDS (acquired immune deficiency syndrome) ([place]) ?  Leukocytopenia ?  Normocytic anemia ?  GERD without esophagitis ?  Protein-calorie malnutrition, severe ?  Thrombocytopenia ([place]) ? ?Abnormal lymphadenopathy:  ?- No further work-up per subspecialist ?- Coordinate biopsy with ID>Sx>Pulm>Oncology.  CT surgery increased for possible biopsy but they felt he is not a candidate for VATS/excisional mediastinal biopsy, no current plan for excisional biopsy, per discussion by ID w pulmonary EBUS with biopsies possible, currently no plan to pursue any further biopsies. ?Lymphoma is still on the differential.moving forward as per ID plan is to cont MAC therapy 3 drug regimen started 04/04/2021.    ?  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  ?Continue to decline WBC trending down ?Severe cachexia muscle wasting  ?- Continue current HIV medication as ordered per ID along with Bactrim and fluconazole prophylaxis  ?  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 CT 20.4 suggesting failure to have cleared initial infection. Now s/p molnupiravir  4/7 - 4/11. No attributable symptoms at this time.  ?  ?Anemia of chronic disease: Worsened by acute illness, s/p 1 unit PRBC 4/11.  Hemoglobin stabilized in 8's. Suggest recheck at follow up.  ? ?Thrombocytopenia: Stable. At this level, can continue VTE  ppx.  ?  ?Hyponatremia: Improved and now stable off IVF.  ?  ?Dysphagia: Continue dysphagia 3 diet recommended by SLP.   ? ?GERD: Continue PPI. ?  ?History of substance abuse: Denies IVDU ? ?Lower leg edema from hypoalbuminemia. Compression, elevation.  ?  ?Right indirect inguinal hernia: No incarceration.  ?- Follow up with surgery.  ? ?Deconditioning/debility: ?Patient is weak and frail seen by PT recommended skilled nursing facility, patient reports he is unable to take care of  himself. Requires rehabilitation, TOC consulted and patient was discharged pending bed availability to SNF. ? ?Failure to thrive  ?Body mass index is 18.5 kg/m?Marland Kitchen ?with dysphagia, loss of appetite, encouraging p.o. intake, dietary supplements, ?On Remeron, despite all measures patient is deteriorating with muscle wasting, weakness ?  ?Severe malnutrition:  ?- Continue megace and liberalized diet.  ?  ?Family Communication: None at bedside. Discussed plan of care with palliative care who is point person at this time and held family meeting. Plan is to move forward with SNF. Pt remains full code. ? ?Overall due to above findings, multiple comorbidities, continued decline mentally and physically, patient is more likely hospice appropriate but goals are currently curative. ? ?Admission status:   ?Status is: Inpatient ?Disposition: Stable for discharge to SNF with palliative care. Alternative recommendation by medical team for home under hospice care has been declined by family.  ? ?Objective: ?BP 107/79 (BP Location: Right Arm)   Pulse (!) 109   Temp (!) 97.4 ?F (36.3 ?C) (Oral)   Resp 18   Ht _0  (1.854 m)   Wt 63.6 kg   SpO2 96%   BMI 18.50 kg/m?   ?Gen: Cachectic, chronically ill-appearing male in no distress ?Pulm: Nonlabored breathing room air. Clear. ?CV: Regular rate and rhythm. No murmur, rub, or gallop. No JVD, no2+ stable dependent edema. ?GI: Abdomen soft, non-tender, non-distended, with normoactive bowel sounds.  ?GU: Right inguinal hernia present without significant tenderness/erythema or edema.  ?Ext: Warm, no deformities ?Skin: No new rashes, lesions or ulcers on visualized skin. ?Neuro: Alert and incompletely oriented. No focal neurological deficits. ?Psych: Irritable, not cooperative limiting evaluation. ? ? ?Patrecia Pour, MD  ?[email or web address] ?05/20/2021, 4:02 PM ? ? ? ? ?

## 2021-05-20 NOTE — Plan of Care (Signed)
  Problem: Skin Integrity: Goal: Risk for impaired skin integrity will decrease Outcome: Progressing   

## 2021-05-21 ENCOUNTER — Ambulatory Visit: Payer: Medicaid Other | Admitting: Pharmacist

## 2021-05-21 DIAGNOSIS — B451 Cerebral cryptococcosis: Secondary | ICD-10-CM | POA: Diagnosis not present

## 2021-05-21 DIAGNOSIS — D708 Other neutropenia: Secondary | ICD-10-CM

## 2021-05-21 DIAGNOSIS — U071 COVID-19: Secondary | ICD-10-CM | POA: Diagnosis not present

## 2021-05-21 DIAGNOSIS — A312 Disseminated mycobacterium avium-intracellulare complex (DMAC): Secondary | ICD-10-CM | POA: Diagnosis not present

## 2021-05-21 DIAGNOSIS — B20 Human immunodeficiency virus [HIV] disease: Secondary | ICD-10-CM | POA: Diagnosis not present

## 2021-05-21 LAB — CBC WITH DIFFERENTIAL/PLATELET
Abs Immature Granulocytes: 0.35 10*3/uL — ABNORMAL HIGH (ref 0.00–0.07)
Basophils Absolute: 0 10*3/uL (ref 0.0–0.1)
Basophils Relative: 1 %
Eosinophils Absolute: 0 10*3/uL (ref 0.0–0.5)
Eosinophils Relative: 0 %
HCT: 25.6 % — ABNORMAL LOW (ref 39.0–52.0)
Hemoglobin: 8.4 g/dL — ABNORMAL LOW (ref 13.0–17.0)
Immature Granulocytes: 13 %
Lymphocytes Relative: 12 %
Lymphs Abs: 0.3 10*3/uL — ABNORMAL LOW (ref 0.7–4.0)
MCH: 33.1 pg (ref 26.0–34.0)
MCHC: 32.8 g/dL (ref 30.0–36.0)
MCV: 100.8 fL — ABNORMAL HIGH (ref 80.0–100.0)
Monocytes Absolute: 0.2 10*3/uL (ref 0.1–1.0)
Monocytes Relative: 8 %
Neutro Abs: 1.7 10*3/uL (ref 1.7–7.7)
Neutrophils Relative %: 66 %
Platelets: 144 10*3/uL — ABNORMAL LOW (ref 150–400)
RBC: 2.54 MIL/uL — ABNORMAL LOW (ref 4.22–5.81)
RDW: 15.5 % (ref 11.5–15.5)
Smear Review: DECREASED
WBC: 2.6 10*3/uL — ABNORMAL LOW (ref 4.0–10.5)
nRBC: 0 % (ref 0.0–0.2)

## 2021-05-21 LAB — C-REACTIVE PROTEIN: CRP: 13.3 mg/dL — ABNORMAL HIGH (ref <1.0)

## 2021-05-21 LAB — PHOSPHORUS: Phosphorus: 3.5 mg/dL (ref 2.5–4.6)

## 2021-05-21 LAB — MAGNESIUM: Magnesium: 1.8 mg/dL (ref 1.7–2.4)

## 2021-05-21 LAB — D-DIMER, QUANTITATIVE: D-Dimer, Quant: 3.27 ug/mL-FEU — ABNORMAL HIGH (ref 0.00–0.50)

## 2021-05-21 LAB — FERRITIN: Ferritin: 1319 ng/mL — ABNORMAL HIGH (ref 24–336)

## 2021-05-21 LAB — LACTATE DEHYDROGENASE: LDH: 183 U/L (ref 98–192)

## 2021-05-21 NOTE — TOC Progression Note (Signed)
Transition of Care (TOC) - Initial/Assessment Note  ? ? ?Patient Details  ?Name: Ricky [name] ?MRN: [id number] ?Date of Birth: March 07, 1955 ? ?Transition of Care (TOC) CM/SW Contact:    ?Paulene Floor Merlene Dante, LCSWA ?Phone Number: ?05/21/2021, 10:00 AM ? ?Clinical Narrative:                ?09:00- [facility] accepted the patient in the [place].  CSW contacted [facility] to inquire about when the facility could accept the patient.  CSW was informed that Medicaid would need to be verified prior to admissions.  The admissions coordinator will contact CSW when this has been verified.   ? ?  ?  ? ? ?Patient Goals and CMS Choice ?  ?  ?  ? ?Expected Discharge Plan and Services ?  ?  ?  ?  ?  ?Expected Discharge Date: 05/20/21               ?  ?  ?  ?  ?  ?  ?  ?  ?  ?  ? ?Prior Living Arrangements/Services ?  ?  ?  ?       ?  ?  ?  ?  ? ?Activities of Daily Living ?Home Assistive Devices/Equipment: [place] Rile (specify type) ?ADL Screening (condition at time of admission) ?Patient's cognitive ability adequate to safely complete daily activities?: Yes ?Is the patient deaf or have difficulty hearing?: No ?Does the patient have difficulty seeing, even when wearing glasses/contacts?: No ?Does the patient have difficulty concentrating, remembering, or making decisions?: No ?Patient able to express need for assistance with ADLs?: Yes ?Does the patient have difficulty dressing or bathing?: No ?Independently performs ADLs?: Yes (appropriate for developmental age) ?Does the patient have difficulty walking or climbing stairs?: Yes ?Weakness of Legs: Both ?Weakness of Arms/Hands: Both ? ?Permission Sought/Granted ?  ?  ?   ?   ?   ?   ? ?Emotional Assessment ?  ?  ?  ?  ?  ?  ? ?Admission diagnosis:  Nocardia infection [A43.9] ?Patient Active Problem List  ? Diagnosis Date Noted  ? Thrombocytopenia ([place]) 05/15/2021  ? Hyponatremia 05/10/2021  ? GERD without esophagitis 05/10/2021  ? Protein-calorie malnutrition, severe 05/10/2021  ? Nocardia  infection 05/09/2021  ? Bladder wall thickening 04/08/2021  ? Abdominal pain 04/08/2021  ? Normocytic anemia 04/08/2021  ? Nausea with vomiting 04/08/2021  ? Fever   ? Acute cholecystitis 01/22/2021  ? Complicated UTI (urinary tract infection) 01/22/2021  ? Noncompliance 01/22/2021  ? Lymphadenopathy   ? Chronic low back pain   ? HIV infection ([place])   ? Community acquired pneumonia   ? Nonadherence to medical treatment   ? COVID-19 virus infection 10/13/2020  ? Alcohol abuse, daily use   ? COVID-19 10/12/2020  ? 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 Blurry vision, left eye   ? AKI (acute kidney injury) ([place])   ? Cavitary lesion of lung   ? Acute nonintractable headache   ? Smoking   ? Cocaine use   ? Paroxysmal SVT (supraventricular tachycardia) (HCC)   ? Abnormal EKG   ? Atrial flutter ([place]) 03/27/2020  ? Aortic atherosclerosis ([place]) 03/27/2020  ? Hypertension 03/27/2020  ? 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 Leukocytopenia 03/26/2020  ? Macrocytosis 03/26/2020  ? Moderate protein malnutrition ([place]) 03/26/2020  ? Hypoalbuminemia 03/26/2020  ? AIDS (acquired immune deficiency syndrome) ([place]) 03/26/2020  ? ?PCP:  Pcp, No ?Pharmacy:   ?[id number]  [place] [place] [address] ?[place] [address] ?Phone: [phone] Fax: [phone] ? ?[facility] ?[address] ?[place] [address] ?Phone: [phone] Fax: [phone] ? ? ? ? ?Social Determinants of Health (SDOH) Interventions ?  ? ?Readmission Risk Interventions ? ?  10/26/2020  ? 12:55 PM 04/25/2020  ? 10:22 AM  ?Readmission Risk Prevention Plan  ?Transportation Screening Complete Complete  ?PCP or Specialist Appt within 5-7 Days Complete   ?PCP or Specialist Appt within 3-5 Days  Complete  ?Home Care Screening Complete   ?[place] or Home Care Consult  Complete  ?Social Work Consult for [place] Planning/Counseling   Complete  ?Palliative Care Screening  Not Applicable  ?Medication Review Press photographer)  Complete  ? ? ? ?

## 2021-05-21 NOTE — Progress Notes (Signed)
PT Cancellation Note ? ?Patient Details ?Name: Ricky [name] ?MRN: [id number] ?DOB: 21-Jun-1955 ? ? ?Cancelled Treatment:    Reason Eval/Treat Not Completed: Patient declined, no reason specified ? ?Will continue attempts;  ? ?Roney Marion, PT  ?Acute Rehabilitation Services ?Office [phone] ? ? ? ?[place]ta [place] ?05/21/2021, 11:47 AM ?

## 2021-05-21 NOTE — Discharge Summary (Signed)
Physician Discharge Summary  ?Ricky [name] NAT:[id number] DOB: [birth year] DOA: 05/09/2021 ? ?PCP: Pcp, No ? ?Admit date: 05/09/2021 ?Discharge date: 05/21/2021 ? ?Time spent: 35 minutes ? ?Recommendations for Outpatient Follow-up:  ?`  ?Abnormal lymphadenopathy:  ?- No further work-up per subspecialist ?- Coordinate biopsy with ID>Sx>Pulm>Oncology.  CT surgery increased for possible biopsy but they felt he is not a candidate for VATS/excisional mediastinal biopsy, no current plan for excisional biopsy, per discussion by ID w pulmonary EBUS with biopsies possible, currently no plan to pursue any further biopsies. ?Lymphoma is still on the differential.moving forward as per ID plan is to cont MAC therapy 3 drug regimen started 04/04/2021.    ?  ?AIDS-HIV:  ?-CD4 count is < 35.  ?Continue to decline WBC trending down ?Severe cachexia muscle wasting  ?- Continue current HIV medication as ordered per ID along with Bactrim and fluconazole prophylaxis  ?  ?History of cryptococcal meningitis:  ?-On daily fluconazole maintenance ?  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 CT 20.4 suggesting failure to have cleared initial infection. Now s/p molnupiravir  4/7 - 4/11. No attributable symptoms at this time.  ?COVID-19 Labs ? ?Recent Labs  ?  05/21/21 ?4270  ?DDIMER 3.27*  ?FERRITIN 1,319*  ?LDH 183  ?CRP 13.3*  ? ? ?Lab Results  ?Component Value Date  ? SARSCOV2NAA POSITIVE (A) 05/09/2021  ? SARSCOV2NAA POSITIVE (A) 02/15/2021  ? SARSCOV2NAA POSITIVE (A) 01/21/2021  ? SARSCOV2NAA POSITIVE (A) 10/20/2020  ?  ?  ?Anemia of chronic disease:  ?-Worsened by acute illness, s/p 1 unit PRBC 4/11.  Hemoglobin stabilized in 8's. Suggest recheck at follow up.  ?  ?Thrombocytopenia:  ?-Stable. At this level, can continue VTE ppx.  ?  ?Hyponatremia:  ?-Improved and now stable off IVF.  ?  ?Dysphagia:  ?-Continue dysphagia 3 diet recommended by SLP.   ?  ?GERD:  ?-Continue PPI. ?  ?History of  substance abuse:  ?-Denies IVDU ?  ?Lower leg edema  ?-From hypoalbuminemia. Compression, elevation.  ?  ?Right indirect inguinal hernia:  ?-No incarceration.  ?- Follow up with surgery.  ?  ?Deconditioning/debility: ?-Patient is weak and frail seen by PT recommended skilled nursing facility, patient reports he is unable to take care of himself. Requires rehabilitation, TOC consulted and patient was discharged pending bed availability to SNF. ?  ?Failure to thrive  ?Body mass index is 18.5 kg/m?Marland Kitchen ?with dysphagia, loss of appetite, encouraging p.o. intake, dietary supplements, ?On Remeron, despite all measures patient is deteriorating with muscle wasting, weakness ?  ?Severe malnutrition:  ?- Continue megace and liberalized diet.  ? ? ?Discharge Diagnoses:  ?Principal Problem: ?  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  Nocardia infection ?  COVID-19 ?  Hyponatremia ?  Cryptococcal meningitis ([place]) ?  AIDS (acquired immune deficiency syndrome) ([place]) ?  Leukocytopenia ?  Normocytic anemia ?  GERD without esophagitis ?  Protein-calorie malnutrition, severe ?  Thrombocytopenia ([place]) ? ? ?Discharge Condition: Guarded ? ?Diet recommendation: Dysphagia 3 diet ? ?Filed Weights  ? 05/10/21 0003  ?Weight: 63.6 kg  ? ? 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 ?Patient  was admitted for further work-up being treated for sepsis due to MAI disseminated infection, bulky adenopathy concerning for lymphoproliferative cancer, severe symptomatic anemia likely from acute illness profound infection, along  with a comfortable meningitis ARDS Nocardia infection hyponatremia COVID-19 positive status, GERD. ?  ?Per report -Noncompliant with abx/HIV meds for weeks(months?) --> ID following on abx - abnormal lymphadenopathy - outpt PET scan abnormal - trying to coordinate biopsy with ID>Sx>Pulm>Oncology --> basically down to CT Sx at this point for mediastinal node biopsy and they feels he is not a candidate for  VATS/ Exciisional biopsy of metastatic adenopathy.  ? ?Hospital Course:  ?See above ? ? ?Antibiotics ?Anti-infectives (From admission, onward)  ? ? Start     Ordered Stop  ? 05/11/21 1400  fluconazole (DIFLUCAN) tablet 200 mg       ? 05/11/21 1303    ? 05/11/21 1000  emtricitabine-tenofovir (TRUVADA) 200-300 MG per tablet 1 tablet       ? 05/10/21 1320    ? 05/10/21 1000  azithromycin (ZITHROMAX) tablet 500 mg       ? 05/09/21 2237    ? 05/10/21 1000  dolutegravir (TIVICAY) tablet 50 mg       ? 05/09/21 2237    ? 05/10/21 1000  emtricitabine-tenofovir AF (DESCOVY) 200-25 MG per tablet 1 tablet  Status:  Discontinued       ? 05/09/21 2237 05/10/21 1320  ? 05/10/21 1000  ethambutol (MYAMBUTOL) tablet 1,000 mg       ? 05/09/21 2237    ? 05/10/21 1000  sulfamethoxazole-trimethoprim (BACTRIM) 400-80 MG per tablet 1 tablet       ? 05/09/21 2237    ? 05/10/21 1000  rifabutin (MYCOBUTIN) capsule 300 mg       ? 05/09/21 2237    ? 05/10/21 1000  molnupiravir EUA (LAGEVRIO) capsule 800 mg       ? 05/10/21 0350 05/14/21 2235  ? ?  ?  ? ? ?Discharge Exam: ?Vitals:  ? 05/20/21 1729 05/20/21 2118 05/21/21 0530 05/21/21 0804  ?BP: 114/82 112/81 (!) 122/92 (!) 128/98  ?Pulse: (!) 101 (!) 101 99 (!) 105  ?Resp: _0 ?Temp: 98.4 ?F (36.9 ?C) (!) 97.5 ?F (36.4 ?C) 97.6 ?F (36.4 ?C) (!) 97.3 ?F (36.3 ?C)  ?TempSrc: Oral Oral Oral Oral  ?SpO2: 100% 98% 98% 100%  ?Weight:      ?Height:      ? ? ?General: Cachectic, chronically ill-appearing ?Cardiovascular: Regular rate and rhythm. No murmur, rub, or gallop. No JVD, no2+ stable  dependent edema ?Respiratory: Nonlabored breathing room air. Clear. ? ? ?Discharge Instructions ? ?Discharge Instructions   ? ? Activity as tolerated - No restrictions   Complete by: As directed ?  ? Diet - low sodium heart healthy   Complete by: As directed ?  ? Discharge instructions   Complete by: As directed ?  ? Continue MAC therapy 3 drug regimen rifabutin, azithromycin and ethambutol(started 04/04/21) ?Follow-up with infectious disease team in 1-2 weeks ?Close follow-up with palliative care  ? Discharge wound care:   Complete by: As directed ?  ? Per wound care recommendation  ? Increase activity slowly   Complete by: As directed ?  ? ?  ? ?Allergies as of 05/21/2021   ? ?   Reactions  ? Bactrim [sulfamethoxazole-trimethoprim] Other (See Comments)  ? Patient was trialed on DS TIW and SS daily for PJP prophylaxis and developed hyperkalemia and increased Scr (pt is currently  taking bactrim per infectious disease notes 05/07/21)  ? ?  ? ?  ?Medication List  ?  ? ?STOP taking these medications   ? ?fluticasone 50 MCG/ACT nasal spray ?Commonly known as: FLONASE ?  ?folic acid 1 MG tablet ?Commonly known as: FOLVITE ?  ?oxyCODONE 5 MG immediate release tablet ?Commonly known as: Oxy IR/ROXICODONE ?  ?pantoprazole 20 MG tablet ?Commonly known as: PROTONIX ?  ? ?  ? ?TAKE these medications   ? ?acetaminophen 500 MG tablet ?Commonly known as: TYLENOL ?Take 500 mg by mouth every 6 (six) hours as needed for moderate pain or headache. ?  ?ascorbic acid 500 MG tablet ?Commonly known as: VITAMIN C ?Take 1 tablet (500 mg total) by mouth daily for 5 days. ?  ?azithromycin 500 MG tablet ?Commonly known as: ZITHROMAX ?Take 1 tablet (500 mg total) by mouth daily. ?  ?chlorpheniramine-HYDROcodone 10-8 MG/5ML ?Take 5 mLs by mouth every 12 (twelve) hours as needed for up to 10 days for cough. ?  ?emtricitabine-tenofovir 200-300 MG tablet ?Commonly known as: TRUVADA ?Take 1 tablet by mouth daily. ?  ?ethambutol 400 MG tablet ?Commonly  known as: MYAMBUTOL ?Take 2.5 tablets (1,000 mg total) by mouth daily. ?  ?famotidine 20 MG tablet ?Commonly known as: PEPCID ?Take 1 tablet by mouth 2 times daily. ?  ?feeding supplement Liqd ?Take 237

## 2021-05-21 NOTE — TOC Transition Note (Signed)
Transition of Care (TOC) - CM/SW Discharge Note ? ? ?Patient Details  ?Name: Ricky [name] ?MRN: [id number] ?Date of Birth: Jan 27, 1956 ? ?Transition of Care (TOC) CM/SW Contact:  ?Paulene Floor Chudney Scheffler, LCSWA ?Phone Number: ?05/21/2021, 3:28 PM ? ? ?Clinical Narrative:    ?[facility] ?Anticipated DC date:05/21/2021 ?Family notified: Yes ?Transport by: Corey Harold ? ? ?Per MD patient ready for DC to SNF. RN to call report prior to discharge [phone] room 144. RN, patient, patient's family, and facility notified of DC. Discharge Summary and FL2 sent to facility. DC packet on chart. Ambulance transport requested for patient.  ? ?CSW will sign off for now as social work intervention is no longer needed. Please consult [place] again if new needs arise. ?  ? ? ?Final next level of care: [place] ?Barriers to Discharge: Barriers Resolved ? ? ?Patient Goals and CMS Choice ?  ?  ?  ? ?Discharge Placement ?  ?           ?Patient chooses bed at:  [facility]) ?Patient to be transferred to facility by: [facility] ?Name of family member notified: [name], [name] (Brother)   [phone] ?Patient and family notified of of transfer: 05/21/21 ? ?Discharge Plan and Services ?  ?  ?           ?  ?  ?  ?  ?  ?  ?  ?  ?  ?  ? ?Social Determinants of Health (SDOH) Interventions ?  ? ? ?Readmission Risk Interventions ? ?  10/26/2020  ? 12:55 PM 04/25/2020  ? 10:22 AM  ?Readmission Risk Prevention Plan  ?Transportation Screening Complete Complete  ?PCP or Specialist Appt within 5-7 Days Complete   ?PCP or Specialist Appt within 3-5 Days  Complete  ?Home Care Screening Complete   ?[place] or Home Care Consult  Complete  ?Social Work Consult for [place] Planning/Counseling  Complete  ?Palliative Care Screening  Not Applicable  ?Medication Review Press photographer)  Complete  ? ? ? ? ? ?

## 2021-05-23 ENCOUNTER — Other Ambulatory Visit (HOSPITAL_COMMUNITY): Payer: Self-pay

## 2021-05-24 ENCOUNTER — Other Ambulatory Visit: Payer: Self-pay

## 2021-05-24 ENCOUNTER — Inpatient Hospital Stay (HOSPITAL_COMMUNITY)
Admission: EM | Admit: 2021-05-24 | Discharge: 2021-05-31 | DRG: 974 | Disposition: A | Discharge status: Hospice | Payer: Medicaid Other | Source: Skilled Nursing Facility | Attending: Internal Medicine | Admitting: Internal Medicine

## 2021-05-24 ENCOUNTER — Encounter (HOSPITAL_COMMUNITY): Payer: Self-pay | Admitting: Emergency Medicine

## 2021-05-24 ENCOUNTER — Emergency Department (HOSPITAL_COMMUNITY): Payer: Medicaid Other

## 2021-05-24 DIAGNOSIS — D61818 Other pancytopenia: Secondary | ICD-10-CM | POA: Diagnosis present

## 2021-05-24 DIAGNOSIS — J1282 Pneumonia due to coronavirus disease 2019: Secondary | ICD-10-CM | POA: Diagnosis present

## 2021-05-24 DIAGNOSIS — L89122 Pressure ulcer of left upper back, stage 2: Secondary | ICD-10-CM | POA: Diagnosis not present

## 2021-05-24 DIAGNOSIS — R601 Generalized edema: Secondary | ICD-10-CM | POA: Diagnosis present

## 2021-05-24 DIAGNOSIS — K219 Gastro-esophageal reflux disease without esophagitis: Secondary | ICD-10-CM | POA: Diagnosis present

## 2021-05-24 DIAGNOSIS — E8809 Other disorders of plasma-protein metabolism, not elsewhere classified: Secondary | ICD-10-CM | POA: Diagnosis present

## 2021-05-24 DIAGNOSIS — E43 Unspecified severe protein-calorie malnutrition: Secondary | ICD-10-CM | POA: Diagnosis present

## 2021-05-24 DIAGNOSIS — J9601 Acute respiratory failure with hypoxia: Secondary | ICD-10-CM | POA: Diagnosis present

## 2021-05-24 DIAGNOSIS — N390 Urinary tract infection, site not specified: Secondary | ICD-10-CM | POA: Diagnosis present

## 2021-05-24 DIAGNOSIS — A312 Disseminated mycobacterium avium-intracellulare complex (DMAC): Secondary | ICD-10-CM | POA: Diagnosis present

## 2021-05-24 DIAGNOSIS — G9341 Metabolic encephalopathy: Secondary | ICD-10-CM | POA: Diagnosis present

## 2021-05-24 DIAGNOSIS — J189 Pneumonia, unspecified organism: Secondary | ICD-10-CM | POA: Diagnosis present

## 2021-05-24 DIAGNOSIS — R680 Hypothermia, not associated with low environmental temperature: Secondary | ICD-10-CM | POA: Diagnosis present

## 2021-05-24 DIAGNOSIS — B451 Cerebral cryptococcosis: Secondary | ICD-10-CM | POA: Diagnosis present

## 2021-05-24 DIAGNOSIS — N179 Acute kidney failure, unspecified: Secondary | ICD-10-CM | POA: Diagnosis present

## 2021-05-24 DIAGNOSIS — R627 Adult failure to thrive: Secondary | ICD-10-CM | POA: Diagnosis present

## 2021-05-24 DIAGNOSIS — K4090 Unilateral inguinal hernia, without obstruction or gangrene, not specified as recurrent: Secondary | ICD-10-CM | POA: Diagnosis present

## 2021-05-24 DIAGNOSIS — E871 Hypo-osmolality and hyponatremia: Secondary | ICD-10-CM | POA: Diagnosis not present

## 2021-05-24 DIAGNOSIS — B20 Human immunodeficiency virus [HIV] disease: Secondary | ICD-10-CM | POA: Diagnosis present

## 2021-05-24 DIAGNOSIS — B952 Enterococcus as the cause of diseases classified elsewhere: Secondary | ICD-10-CM | POA: Diagnosis present

## 2021-05-24 DIAGNOSIS — A4189 Other specified sepsis: Secondary | ICD-10-CM | POA: Diagnosis present

## 2021-05-24 DIAGNOSIS — R591 Generalized enlarged lymph nodes: Secondary | ICD-10-CM | POA: Diagnosis present

## 2021-05-24 DIAGNOSIS — R7401 Elevation of levels of liver transaminase levels: Secondary | ICD-10-CM | POA: Diagnosis present

## 2021-05-24 DIAGNOSIS — L89152 Pressure ulcer of sacral region, stage 2: Secondary | ICD-10-CM | POA: Diagnosis present

## 2021-05-24 DIAGNOSIS — Z681 Body mass index (BMI) 19 or less, adult: Secondary | ICD-10-CM

## 2021-05-24 DIAGNOSIS — R188 Other ascites: Secondary | ICD-10-CM | POA: Diagnosis present

## 2021-05-24 DIAGNOSIS — E872 Acidosis, unspecified: Secondary | ICD-10-CM | POA: Diagnosis present

## 2021-05-24 DIAGNOSIS — E162 Hypoglycemia, unspecified: Secondary | ICD-10-CM | POA: Diagnosis present

## 2021-05-24 DIAGNOSIS — R64 Cachexia: Secondary | ICD-10-CM | POA: Diagnosis present

## 2021-05-24 DIAGNOSIS — D7589 Other specified diseases of blood and blood-forming organs: Secondary | ICD-10-CM | POA: Diagnosis present

## 2021-05-24 DIAGNOSIS — Z711 Person with feared health complaint in whom no diagnosis is made: Secondary | ICD-10-CM | POA: Diagnosis not present

## 2021-05-24 DIAGNOSIS — U071 COVID-19: Secondary | ICD-10-CM | POA: Diagnosis present

## 2021-05-24 DIAGNOSIS — L8990 Pressure ulcer of unspecified site, unspecified stage: Secondary | ICD-10-CM | POA: Insufficient documentation

## 2021-05-24 DIAGNOSIS — Z883 Allergy status to other anti-infective agents status: Secondary | ICD-10-CM

## 2021-05-24 DIAGNOSIS — I700 Atherosclerosis of aorta: Secondary | ICD-10-CM | POA: Diagnosis present

## 2021-05-24 DIAGNOSIS — Z789 Other specified health status: Secondary | ICD-10-CM | POA: Diagnosis not present

## 2021-05-24 DIAGNOSIS — Z515 Encounter for palliative care: Secondary | ICD-10-CM | POA: Diagnosis not present

## 2021-05-24 DIAGNOSIS — R54 Age-related physical debility: Secondary | ICD-10-CM | POA: Diagnosis present

## 2021-05-24 DIAGNOSIS — R1310 Dysphagia, unspecified: Secondary | ICD-10-CM | POA: Diagnosis present

## 2021-05-24 DIAGNOSIS — R590 Localized enlarged lymph nodes: Secondary | ICD-10-CM | POA: Diagnosis present

## 2021-05-24 DIAGNOSIS — Z87891 Personal history of nicotine dependence: Secondary | ICD-10-CM

## 2021-05-24 DIAGNOSIS — Z8611 Personal history of tuberculosis: Secondary | ICD-10-CM

## 2021-05-24 DIAGNOSIS — Z781 Physical restraint status: Secondary | ICD-10-CM

## 2021-05-24 DIAGNOSIS — R6520 Severe sepsis without septic shock: Secondary | ICD-10-CM | POA: Diagnosis not present

## 2021-05-24 DIAGNOSIS — E860 Dehydration: Secondary | ICD-10-CM | POA: Diagnosis present

## 2021-05-24 DIAGNOSIS — Z79899 Other long term (current) drug therapy: Secondary | ICD-10-CM

## 2021-05-24 DIAGNOSIS — Z7189 Other specified counseling: Secondary | ICD-10-CM | POA: Diagnosis not present

## 2021-05-24 LAB — CBC WITH DIFFERENTIAL/PLATELET
Abs Immature Granulocytes: 1 10*3/uL — ABNORMAL HIGH (ref 0.00–0.07)
Band Neutrophils: 12 %
Basophils Absolute: 0 10*3/uL (ref 0.0–0.1)
Basophils Relative: 0 %
Blasts: 1 %
Eosinophils Absolute: 0 10*3/uL (ref 0.0–0.5)
Eosinophils Relative: 0 %
HCT: 23.3 % — ABNORMAL LOW (ref 39.0–52.0)
Hemoglobin: 7.5 g/dL — ABNORMAL LOW (ref 13.0–17.0)
Lymphocytes Relative: 3 %
Lymphs Abs: 0.1 10*3/uL — ABNORMAL LOW (ref 0.7–4.0)
MCH: 33 pg (ref 26.0–34.0)
MCHC: 32.2 g/dL (ref 30.0–36.0)
MCV: 102.6 fL — ABNORMAL HIGH (ref 80.0–100.0)
Metamyelocytes Relative: 22 %
Monocytes Absolute: 0.1 10*3/uL (ref 0.1–1.0)
Monocytes Relative: 4 %
Myelocytes: 19 %
Neutro Abs: 1.2 10*3/uL — ABNORMAL LOW (ref 1.7–7.7)
Neutrophils Relative %: 37 %
Other: 2 %
Platelets: 106 10*3/uL — ABNORMAL LOW (ref 150–400)
RBC: 2.27 MIL/uL — ABNORMAL LOW (ref 4.22–5.81)
RDW: 15.5 % (ref 11.5–15.5)
Smear Review: DECREASED
WBC: 2.5 10*3/uL — ABNORMAL LOW (ref 4.0–10.5)
nRBC: 0 % (ref 0.0–0.2)

## 2021-05-24 LAB — COMPREHENSIVE METABOLIC PANEL
ALT: 52 U/L — ABNORMAL HIGH (ref 0–44)
AST: 91 U/L — ABNORMAL HIGH (ref 15–41)
Alkaline Phosphatase: 96 U/L (ref 38–126)
Anion gap: 3 — ABNORMAL LOW (ref 5–15)
BUN: 24 mg/dL — ABNORMAL HIGH (ref 8–23)
CO2: 20 mmol/L — ABNORMAL LOW (ref 22–32)
Calcium: 8.6 mg/dL — ABNORMAL LOW (ref 8.9–10.3)
Chloride: 111 mmol/L (ref 98–111)
Creatinine, Ser: 1.58 mg/dL — ABNORMAL HIGH (ref 0.61–1.24)
GFR, Estimated: 49 mL/min — ABNORMAL LOW (ref >60)
Glucose, Bld: 176 mg/dL — ABNORMAL HIGH (ref 70–99)
Potassium: 4.6 mmol/L (ref 3.5–5.1)
Sodium: 134 mmol/L — ABNORMAL LOW (ref 135–145)
Total Bilirubin: 1.6 mg/dL — ABNORMAL HIGH (ref 0.3–1.2)
Total Protein: 5.7 g/dL — ABNORMAL LOW (ref 6.5–8.1)

## 2021-05-24 LAB — CBG MONITORING, ED
Glucose-Capillary: 53 mg/dL — ABNORMAL LOW (ref 70–99)
Glucose-Capillary: 120 mg/dL — ABNORMAL HIGH (ref 70–99)
Glucose-Capillary: 127 mg/dL — ABNORMAL HIGH (ref 70–99)

## 2021-05-24 LAB — LACTIC ACID, PLASMA: Lactic Acid, Venous: 1.6 mmol/L (ref 0.5–1.9)

## 2021-05-24 LAB — RESP PANEL BY RT-PCR (FLU A&B, COVID) ARPGX2
Influenza A by PCR: NEGATIVE
Influenza B by PCR: NEGATIVE
SARS Coronavirus 2 by RT PCR: POSITIVE — AB

## 2021-05-24 IMAGING — CT CT HEAD W/O CM
4 series · 16 of 47 positions shown, 18 images · non-contrast
Comparison: [DATE]

CLINICAL DATA: Hypoglycemia and unresponsive.



[Series 3: head without · axial · non-contrast · 0.43mm/px · z∈[-111,+9]mm · 7 of 32 slices shown, 9 images]
[im 4/32  brain]
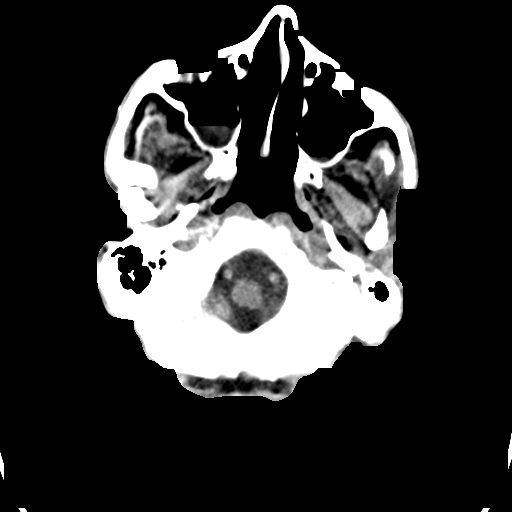
[im 4/32  bone]
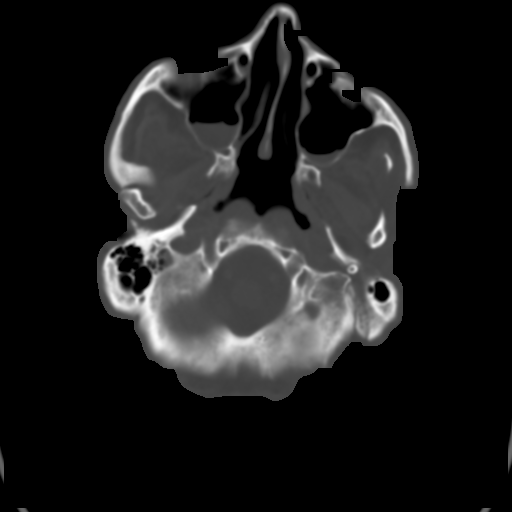
[im 8/32  brain]
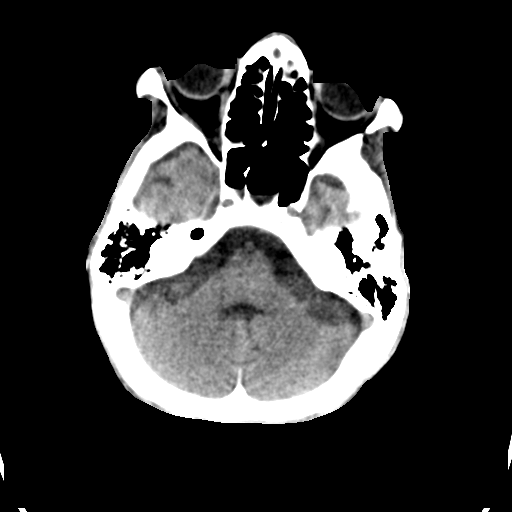
[im 12/32  brain]
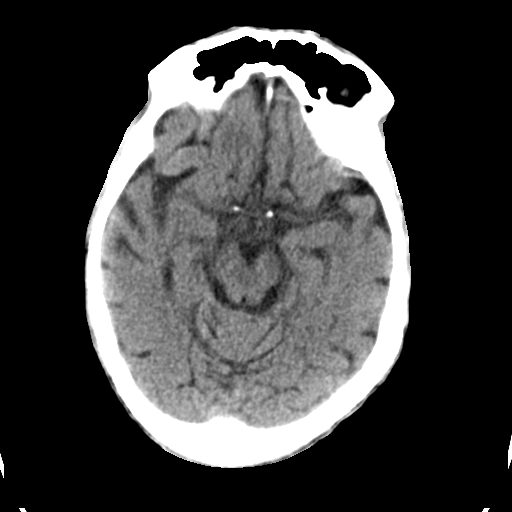
[im 16/32  brain]
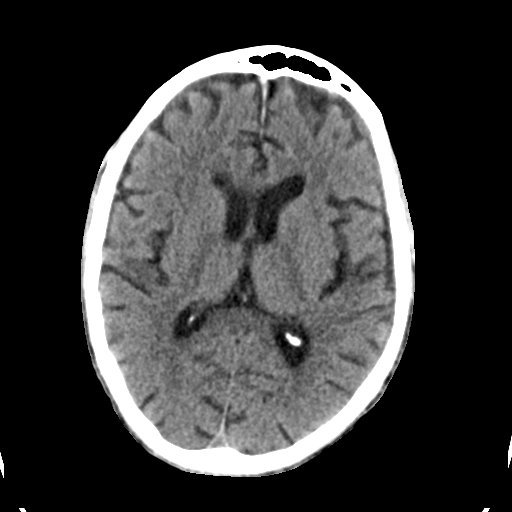
[im 20/32  brain]
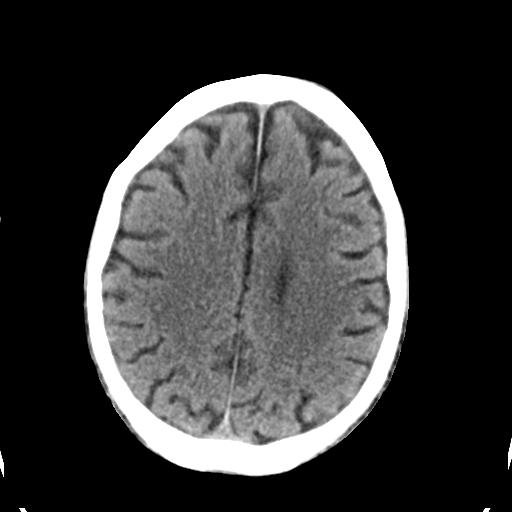
[im 20/32  bone]
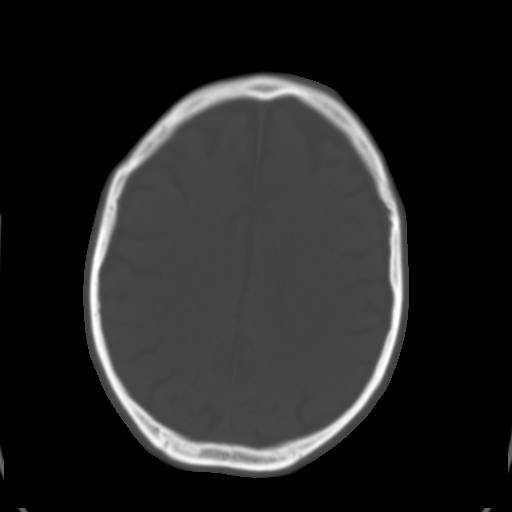
[im 24/32  brain]
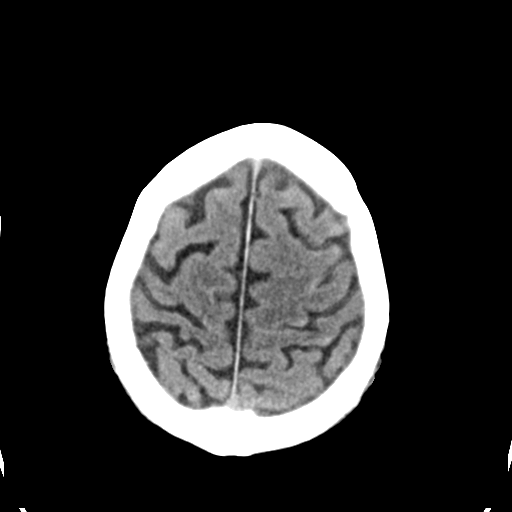
[im 28/32  brain]
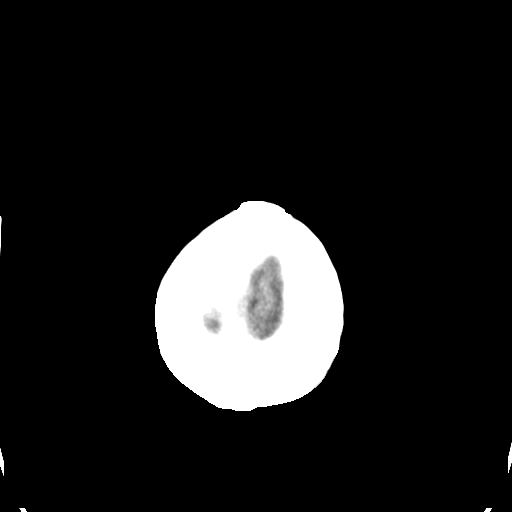

[Series 4: head bone · axial · 0.43mm/px · z∈[-112,-80]mm · 3 of 78 slices shown]
[im 8/78  bone]
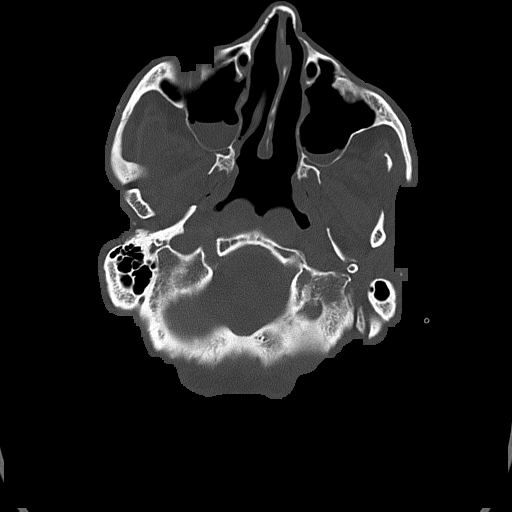
[im 16/78  bone]
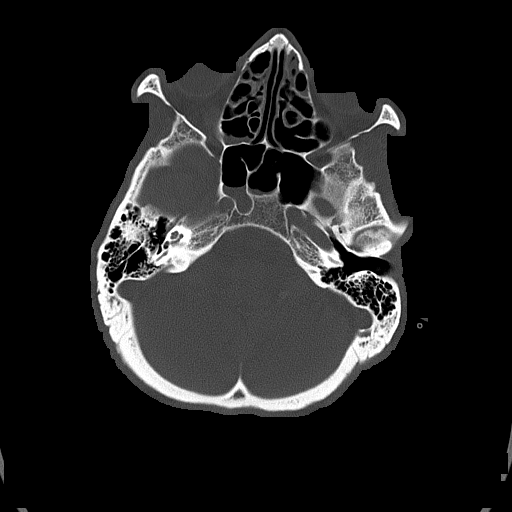
[im 24/78  bone]
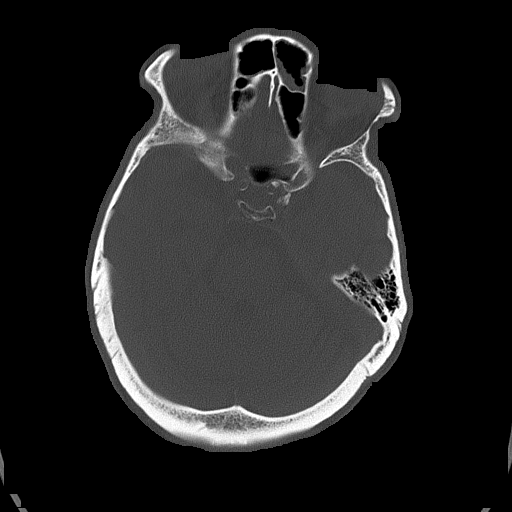

[Series 5: head without cor · coronal · non-contrast · 0.31mm/px · 3 of 67 slices shown]
[im 26/67  brain]
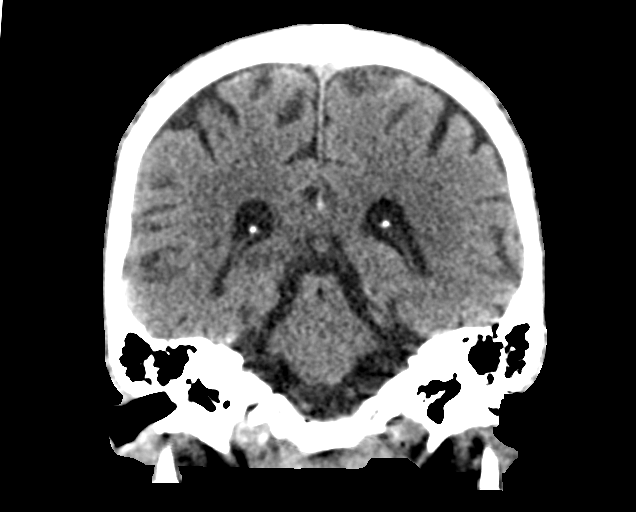
[im 31/67  brain]
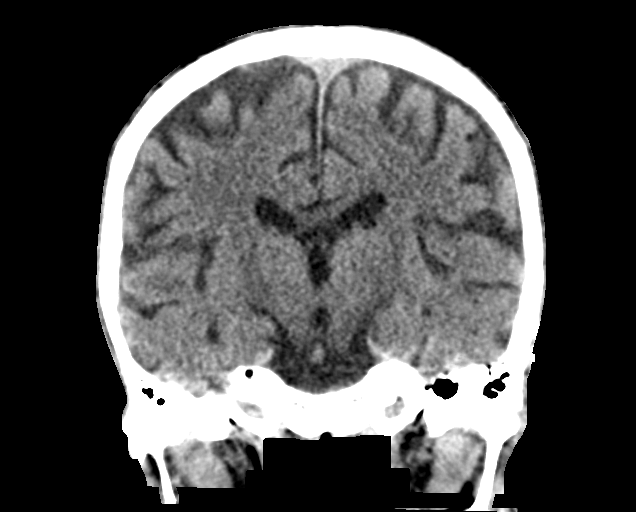
[im 36/67  brain]
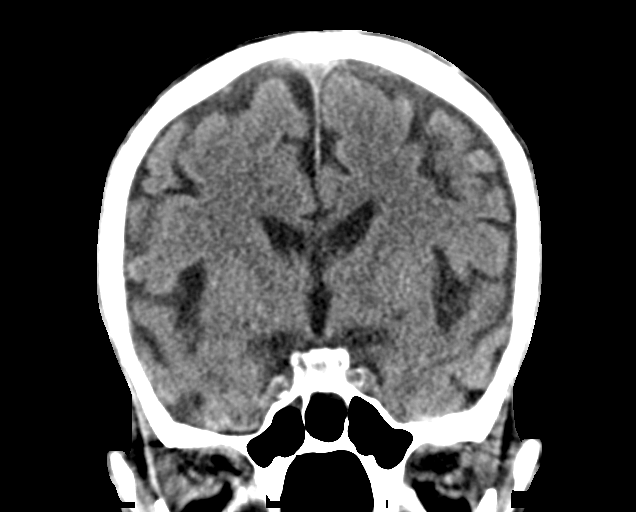

[Series 6: head without sag · sagittal · non-contrast · 0.32mm/px · 3 of 58 slices shown]
[im 20/58  brain]
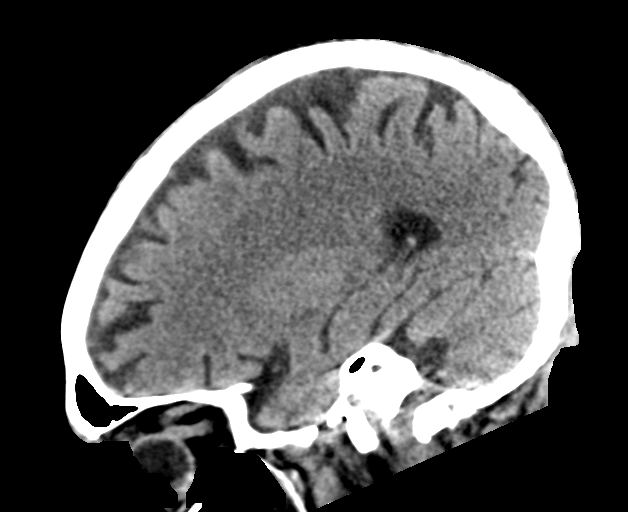
[im 29/58  brain]
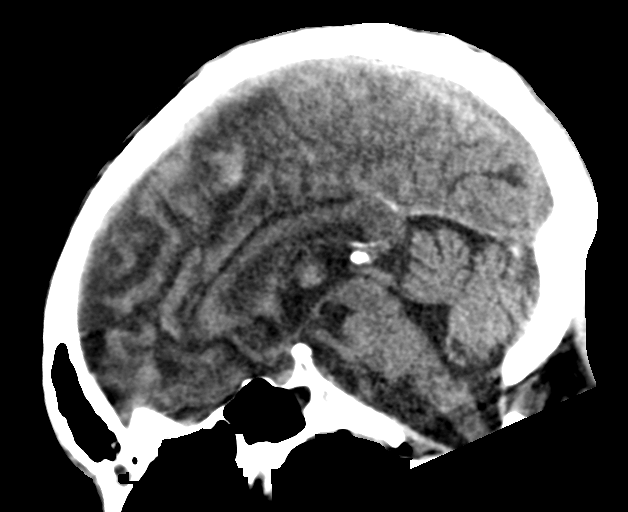
[im 39/58  brain]
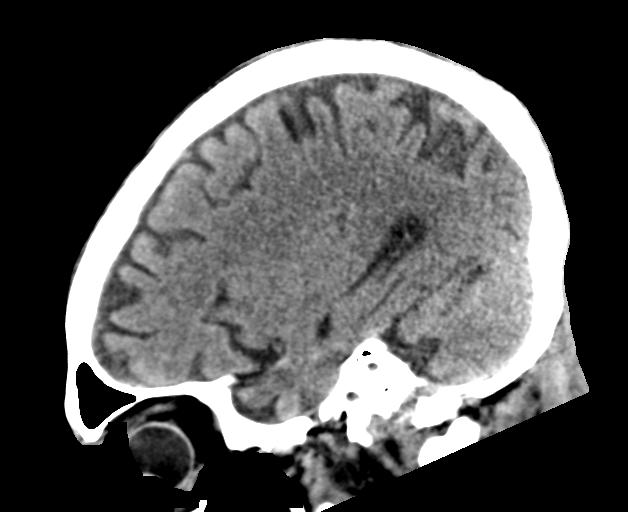

[16 of 47 positions shown; findings below may reference images not displayed]

FINDINGS: Brain: There is mild cerebral atrophy with widening of the
extra-axial spaces and ventricular dilatation.
There are areas of decreased attenuation within the white matter
tracts of the supratentorial brain, consistent with microvascular
disease changes.

Vascular: No hyperdense vessel or unexpected calcification.

Skull: Normal. Negative for fracture or focal lesion.

Sinuses/Orbits: Small to moderate size bilateral maxillary sinus and
sphenoid sinus air-fluid levels are seen. Moderate to marked
severity bilateral ethmoid sinus mucosal thickening is noted.

Other: None.
IMPRESSION: 1. No acute intracranial abnormality.
2. Generalized cerebral atrophy with chronic white matter small
vessel ischemic changes.
3. Moderate to marked severity pansinus disease.

## 2021-05-24 IMAGING — DX DG CHEST 1V PORT
1 series · 1 of 1 positions shown · non-contrast
Comparison: Chest x-ray [DATE].

CLINICAL DATA: Altered mental status.  Hypoglycemia.

EXAM:
PORTABLE CHEST 1 VIEW

[chest ap]
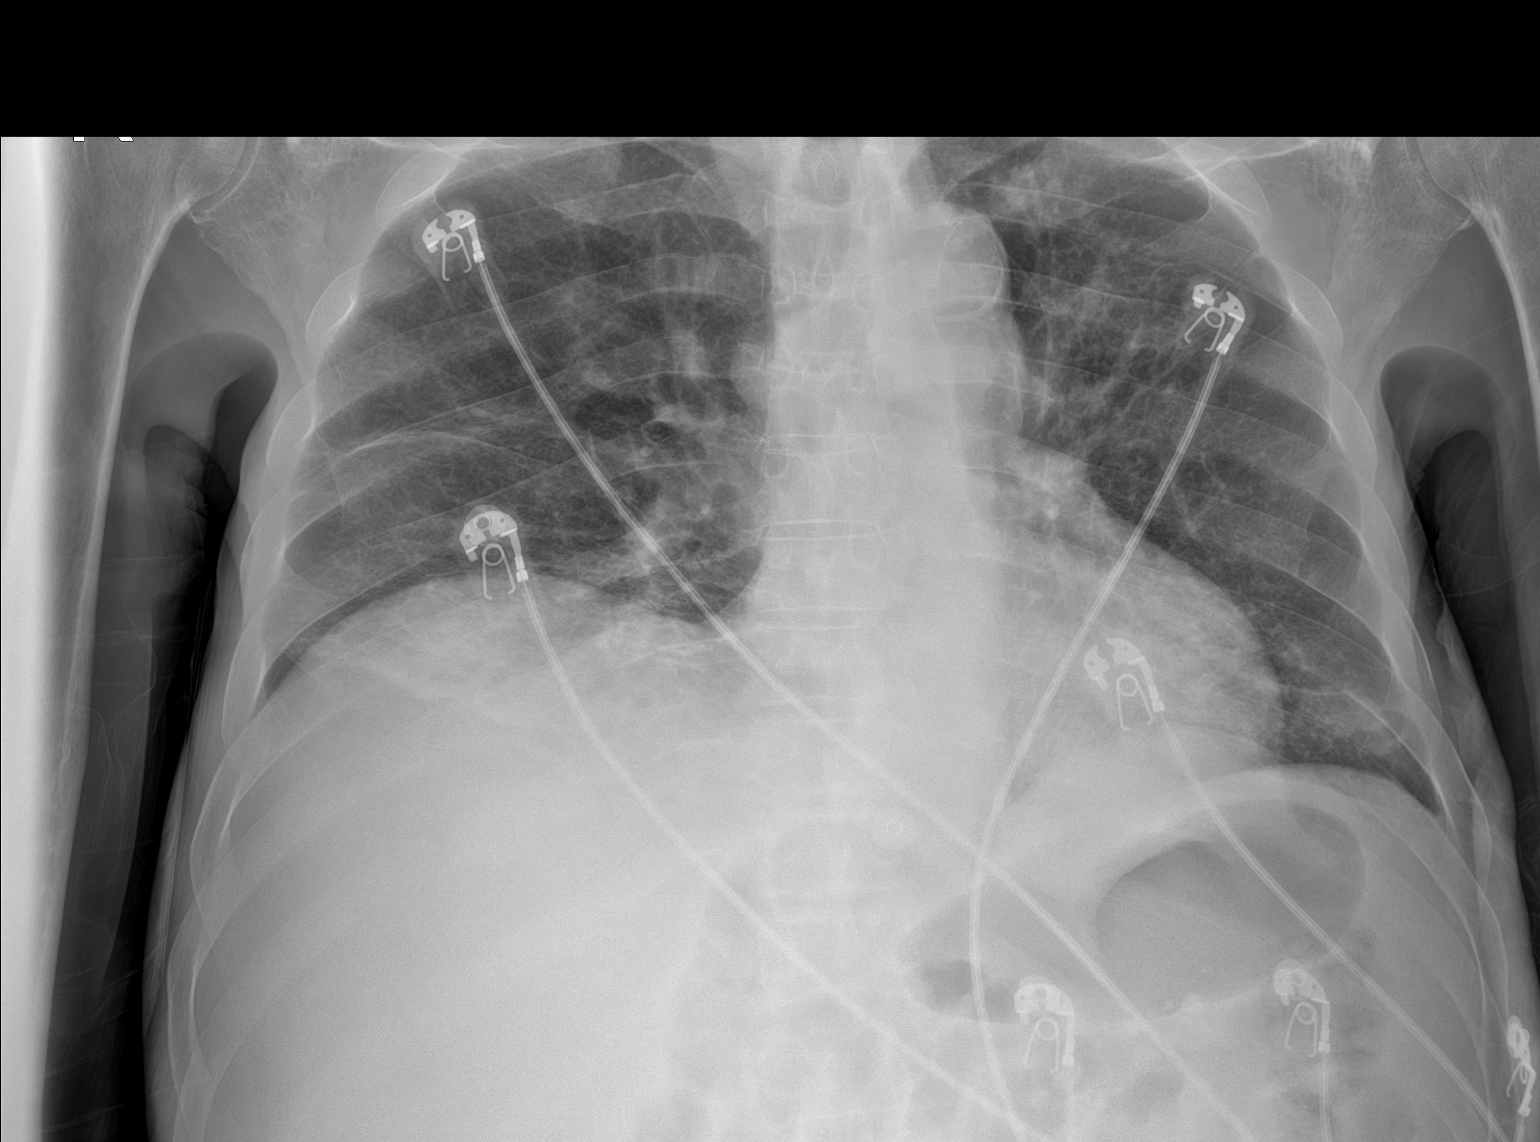

[1 of 1 positions shown; findings below may reference images not displayed]

FINDINGS: The heart and mediastinal contours are unchanged. Aortic
calcification.

Low lung volumes. Bibasilar streaky airspace opacities likely
representing atelectasis. Retrocardiac airspace opacity. No
pulmonary edema. No pleural effusion. No pneumothorax.

No acute osseous abnormality.
IMPRESSION: 1. Low lung volumes with retrocardiac airspace opacity. Followup PA
and lateral chest X-ray is recommended in 3-4 weeks following
therapy to ensure resolution and exclude underlying malignancy.
2.  Aortic Atherosclerosis ([AE]-[AE]).

## 2021-05-24 MED ORDER — SENNOSIDES-DOCUSATE SODIUM 8.6-50 MG PO TABS: 1.0000 | ORAL_TABLET | Freq: Every evening | ORAL | Status: DC | PRN

## 2021-05-24 MED ORDER — MORPHINE SULFATE (PF) 4 MG/ML IV SOLN
4.0000 mg | Freq: Once | INTRAVENOUS | Status: AC
  Administered 2021-05-24: 4 mg via INTRAVENOUS
  Filled 2021-05-24: qty 1

## 2021-05-24 MED ORDER — CEFTRIAXONE SODIUM 2 G IJ SOLR
2.0000 g | Freq: Every day | INTRAMUSCULAR | Status: DC
  Administered 2021-05-24 – 2021-05-25 (×2): 2 g via INTRAVENOUS
  Filled 2021-05-24 (×2): qty 20

## 2021-05-24 MED ORDER — MIRTAZAPINE 15 MG PO TABS
15.0000 mg | ORAL_TABLET | Freq: Every day | ORAL | Status: DC
  Administered 2021-05-24: 15 mg via ORAL
  Filled 2021-05-24 (×3): qty 1

## 2021-05-24 MED ORDER — GUAIFENESIN-DM 100-10 MG/5ML PO SYRP
10.0000 mL | ORAL_SOLUTION | ORAL | Status: DC | PRN
  Administered 2021-05-25: 10 mL via ORAL
  Filled 2021-05-24 (×2): qty 10

## 2021-05-24 MED ORDER — MEGESTROL ACETATE 400 MG/10ML PO SUSP
400.0000 mg | Freq: Every day | ORAL | Status: DC
  Administered 2021-05-25: 400 mg via ORAL
  Filled 2021-05-24 (×3): qty 10

## 2021-05-24 MED ORDER — SULFAMETHOXAZOLE-TRIMETHOPRIM 400-80 MG PO TABS
1.0000 | ORAL_TABLET | Freq: Every day | ORAL | Status: DC
  Administered 2021-05-25: 1 via ORAL
  Filled 2021-05-24 (×4): qty 1

## 2021-05-24 MED ORDER — ENSURE ENLIVE PO LIQD
237.0000 mL | Freq: Three times a day (TID) | ORAL | Status: DC
  Administered 2021-05-25 (×2): 237 mL via ORAL
  Filled 2021-05-24: qty 237

## 2021-05-24 MED ORDER — ACETAMINOPHEN 325 MG PO TABS
650.0000 mg | ORAL_TABLET | Freq: Four times a day (QID) | ORAL | Status: DC | PRN
  Administered 2021-05-25 – 2021-05-27 (×3): 650 mg via ORAL
  Filled 2021-05-24 (×4): qty 2

## 2021-05-24 MED ORDER — DEXTROSE 50 % IV SOLN
INTRAVENOUS | Status: AC
  Administered 2021-05-24: 50 mL via INTRAVENOUS
  Filled 2021-05-24: qty 50

## 2021-05-24 MED ORDER — DEXTROSE 50 % IV SOLN: 50.0000 mL | Freq: Once | INTRAVENOUS | Status: AC

## 2021-05-24 MED ORDER — ETHAMBUTOL HCL 400 MG PO TABS
15.0000 mg/kg | ORAL_TABLET | Freq: Every day | ORAL | Status: DC
  Administered 2021-05-25: 1000 mg via ORAL
  Filled 2021-05-24 (×3): qty 2

## 2021-05-24 MED ORDER — DEXTROSE 10 % IV SOLN: INTRAVENOUS | Status: DC

## 2021-05-24 MED ORDER — DEXTROSE 10 % IV SOLN: INTRAVENOUS | Status: AC

## 2021-05-24 MED ORDER — ACETAMINOPHEN 650 MG RE SUPP: 650.0000 mg | Freq: Four times a day (QID) | RECTAL | Status: DC | PRN

## 2021-05-24 MED ORDER — SODIUM CHLORIDE 0.9% FLUSH
3.0000 mL | Freq: Two times a day (BID) | INTRAVENOUS | Status: DC
  Administered 2021-05-25 – 2021-05-31 (×10): 3 mL via INTRAVENOUS

## 2021-05-24 MED ORDER — FLUCONAZOLE 100 MG PO TABS
200.0000 mg | ORAL_TABLET | Freq: Every day | ORAL | Status: DC
  Administered 2021-05-25: 200 mg via ORAL
  Filled 2021-05-24: qty 2
  Filled 2021-05-24: qty 1

## 2021-05-24 MED ORDER — EMTRICITABINE-TENOFOVIR AF 200-25 MG PO TABS
1.0000 | ORAL_TABLET | Freq: Every day | ORAL | Status: DC
  Administered 2021-05-25: 1 via ORAL
  Filled 2021-05-24 (×3): qty 1

## 2021-05-24 MED ORDER — FAMOTIDINE 20 MG PO TABS
20.0000 mg | ORAL_TABLET | Freq: Two times a day (BID) | ORAL | Status: DC
  Administered 2021-05-24 – 2021-05-25 (×2): 20 mg via ORAL
  Filled 2021-05-24 (×4): qty 1

## 2021-05-24 MED ORDER — AZITHROMYCIN 500 MG PO TABS
500.0000 mg | ORAL_TABLET | Freq: Every day | ORAL | Status: DC
  Administered 2021-05-25: 500 mg via ORAL
  Filled 2021-05-24: qty 2
  Filled 2021-05-24 (×2): qty 1

## 2021-05-24 MED ORDER — DOLUTEGRAVIR SODIUM 50 MG PO TABS
50.0000 mg | ORAL_TABLET | Freq: Every day | ORAL | Status: DC
  Administered 2021-05-25: 50 mg via ORAL
  Filled 2021-05-24 (×3): qty 1

## 2021-05-24 MED ORDER — RIFABUTIN 150 MG PO CAPS
300.0000 mg | ORAL_CAPSULE | Freq: Every day | ORAL | Status: DC
Start: 2021-05-25 — End: 2021-05-27
  Administered 2021-05-25: 300 mg via ORAL
  Filled 2021-05-24 (×4): qty 2

## 2021-05-24 NOTE — ED Notes (Signed)
Family requesting updates from admitting provider. Name Ozzie Hoyle [phone], Twanna Hy [phone]. ?

## 2021-05-24 NOTE — H&P (Signed)
?History and Physical  ? ? ?Ricky [name] EGB:[id number] DOB: 04/07/1955 DOA: 05/24/2021 ? ?PCP: Pcp, No  ? ?Patient coming from: SNF  ? ?Chief Complaint: Decreased LOC, low blood sugar  ? ?HPI: Ricky [name] is a pleasant 66 y.o. male with medical history significant for HIV/AIDS with recent CD4 <35, disseminated MAC, history of cryptococcal meningitis, chronic COVID, dysphagia, debility, severe malnutrition, and abnormal lymphadenopathy, now presenting from his SNF where he was noted to be poorly responsive and found to have CBG in the 64s.  He is unable to contribute much to the history due to his clinical condition.  CBG improved with IV dextrose prior to arrival in the ED, but quickly dropped again.  He does not have history of diabetes or take any diabetes medications.  He has not been eating despite encouragement per report of his family.  He has not been noted to be coughing much and did not appear to be short of breath recently per report. ? ?ED Course: Upon arrival to the ED, patient is found to be afebrile and saturating upper 70s to 90s on room air with tachypnea and stable blood pressure.  Head CT negative for acute intracranial abnormality.  Chest x-ray with retrocardiac opacity.  Chemistry panel notable for mild elevation in LFTs, hypoalbuminemia, and creatinine 1.58.  CBC with macrocytosis, pancytopenia, and marked left shift.  Blood cultures were collected in the ED and patient was started on IV dextrose infusion. ? ?Review of Systems:  ?Unable to complete ROS secondary to patient's clinical condition. ? ?Past Medical History:  ?Diagnosis Date  ? Human immunodeficiency virus (HIV) disease ([place]) 03/26/2020  ? ? ?Past Surgical History:  ?Procedure Laterality Date  ? IR FL GUIDED LOC OF NEEDLE/CATH TIP FOR SPINAL INJECTION LT  10/17/2020  ? SVT ABLATION N/A 04/23/2020  ? Procedure: SVT ABLATION;  Surgeon: Evans Lance, MD;  Location: [facility];  Service: Cardiovascular;  Laterality: N/A;   ? ? ?Social History:  ? reports that he has quit smoking. His smoking use included cigarettes. He has quit using smokeless tobacco. He reports current alcohol use of about 14.0 - 21.0 standard drinks per week. He reports that he does not currently use drugs. ? ?Allergies  ?Allergen Reactions  ? Bactrim [Sulfamethoxazole-Trimethoprim] Other (See Comments)  ?  Patient was trialed on DS TIW and SS daily for PJP prophylaxis and developed hyperkalemia and increased Scr (pt is currently taking bactrim per infectious disease notes 05/07/21)  ? ? ?Family History  ?Problem Relation Age of Onset  ? Hypertension Mother   ? Kidney disease Mother   ? Hypertension Sister   ? Hypertension Brother   ? Kidney cancer Brother   ? Colon cancer Neg Hx   ? Liver cancer Neg Hx   ? Pancreatic cancer Neg Hx   ? Esophageal cancer Neg Hx   ? ? ? ?Prior to Admission medications   ?Medication Sig Start Date End Date Taking? Authorizing Provider  ?acetaminophen (TYLENOL) 500 MG tablet Take 500 mg by mouth every 6 (six) hours as needed for moderate pain or headache.    [provider]  ?azithromycin (ZITHROMAX) 500 MG tablet Take 1 tablet (500 mg total) by mouth daily. 04/04/21 03/30/22  Prudencio Pair T, MD  ?chlorpheniramine-HYDROcodone 10-8 MG/5ML Take 5 mLs by mouth every 12 (twelve) hours as needed for up to 10 days for cough. 05/17/21 05/27/21  ShahmehdiValeria Batman, MD  ?dolutegravir (TIVICAY) 50 MG tablet Take 1 tablet (50  mg total) by mouth daily. 04/04/21   Vu, Johnny Bridge T, MD  ?emtricitabine-tenofovir (TRUVADA) 200-300 MG tablet Take 1 tablet by mouth daily. 04/04/21   Vu, Rockey Situ, MD  ?ethambutol (MYAMBUTOL) 400 MG tablet Take 2.5 tablets (1,000 mg total) by mouth daily. 04/04/21 03/30/22  Prudencio Pair T, MD  ?famotidine (PEPCID) 20 MG tablet Take 1 tablet by mouth 2 times daily. 05/17/21 06/16/21  ShahmehdiValeria Batman, MD  ?feeding supplement (ENSURE ENLIVE / ENSURE PLUS) LIQD Take 237 mLs by mouth 3 (three) times daily between meals. 05/17/21   Shahmehdi,  Valeria Batman, MD  ?fluconazole (DIFLUCAN) 200 MG tablet Take 1 tablet (200 mg total) by mouth daily. 04/04/21 03/30/22  Vu, Rockey Situ, MD  ?guaiFENesin-dextromethorphan (ROBITUSSIN DM) 100-10 MG/5ML syrup Take 10 mLs by mouth every 4 (four) hours as needed for cough. 05/17/21   Shahmehdi, Valeria Batman, MD  ?magnesium hydroxide (MILK OF MAGNESIA) 400 MG/5ML suspension Take 30 mLs by mouth daily as needed for mild constipation. 05/17/21   Shahmehdi, Valeria Batman, MD  ?megestrol (MEGACE) 400 MG/10ML suspension Take 10 mLs (400 mg total) by mouth daily. 05/17/21   ShahmehdiValeria Batman, MD  ?mirtazapine (REMERON) 15 MG tablet Take 1 tablet by mouth at bedtime. ?Patient not taking: Reported on 05/10/2021 04/16/21   Esmond Plants, RPH-CPP  ?ondansetron (ZOFRAN-ODT) 4 MG disintegrating tablet Dissolve 1 tablet (4 mg total) by mouth every 12 (twelve) hours. 05/17/21   Shahmehdi, Valeria Batman, MD  ?rifabutin (MYCOBUTIN) 150 MG capsule Take 2 capsules (300 mg total) by mouth daily. 04/04/21 03/30/22  Jabier Mutton, MD  ?sulfamethoxazole-trimethoprim (BACTRIM) 400-80 MG tablet Take 1 tablet by mouth daily. 04/04/21 03/30/22  Jabier Mutton, MD  ? ? ?Physical Exam: ?Vitals:  ? 05/24/21 2000 05/24/21 2022 05/24/21 2045 05/24/21 2115  ?BP: (!) 113/91  116/86 (!) 116/99  ?Pulse:   90 (!) 54  ?Resp: _0 ?Temp:      ?TempSrc:      ?SpO2:   98% (!) 78%  ?Weight:  63.6 kg    ?Height:  _1  (1.854 m)    ? ? ?Constitutional: Cachectic, calm  ?Eyes: PERTLA, lids and conjunctivae normal ?ENMT: Mucous membranes are dry. Posterior pharynx clear of any exudate or lesions.   ?Neck: supple, no masses  ?Respiratory: no wheezing, no crackles. No accessory muscle use.  ?Cardiovascular: S1 & S2 heard, regular rate and rhythm. Pitting edema b/l LEs. ?Abdomen: no tenderness, soft. Bowel sounds active.  ?Musculoskeletal: no clubbing / cyanosis. No joint deformity upper and lower extremities.   ?Skin: no significant rashes, lesions, ulcers. Warm, dry, well-perfused. ?Neurologic: No  gross facial asymmetry. Moving all extremities. Alert.  ?Psychiatric: Calm. Cooperative.  ? ? ?Labs and Imaging on Admission: I have personally reviewed following labs and imaging studies ? ?CBC: ?Recent Labs  ?Lab 05/21/21 ?4562 05/24/21 ?2000  ?WBC 2.6* 2.5*  ?NEUTROABS 1.7 1.2*  ?HGB 8.4* 7.5*  ?HCT 25.6* 23.3*  ?MCV 100.8* 102.6*  ?PLT 144* 106*  ? ?Basic Metabolic Panel: ?Recent Labs  ?Lab 05/18/21 ?0754 05/19/21 ?1018 05/20/21 ?0508 05/21/21 ?0855 05/24/21 ?2000  ?NA 134* 134* 135  --  134*  ?K 4.2 4.3 4.3  --  4.6  ?CL 113* 112* 113*  --  111  ?CO2 19* 19* 19*  --  20*  ?GLUCOSE 78 72 71  --  176*  ?BUN _2 --  24*  ?CREATININE 0.96 1.05 1.09  --  1.58*  ?  CALCIUM 7.7* 7.8* 8.1*  --  8.6*  ?MG  --   --   --  1.8  --   ?PHOS  --   --   --  3.5  --   ? ?GFR: ?Estimated Creatinine Clearance: 43 mL/min (A) (by C-G formula based on SCr of 1.58 mg/dL (H)). ?Liver Function Tests: ?Recent Labs  ?Lab 05/18/21 ?6438 05/24/21 ?2000  ?AST 70* 91*  ?ALT 32 52*  ?ALKPHOS 135* 96  ?BILITOT 1.9* 1.6*  ?PROT 6.0* 5.7*  ?ALBUMIN <1.5* RESULTS UNAVAILABLE DUE TO INTERFERING SUBSTANCE  ? ?No results for input(s): LIPASE, AMYLASE in the last 168 hours. ?No results for input(s): AMMONIA in the last 168 hours. ?Coagulation Profile: ?No results for input(s): INR, PROTIME in the last 168 hours. ?Cardiac Enzymes: ?No results for input(s): CKTOTAL, CKMB, CKMBINDEX, TROPONINI in the last 168 hours. ?BNP (last 3 results) ?No results for input(s): PROBNP in the last 8760 hours. ?HbA1C: ?No results for input(s): HGBA1C in the last 72 hours. ?CBG: ?Recent Labs  ?Lab 05/24/21 ?1934 05/24/21 ?2051  ?GLUCAP 53* 120*  ? ?Lipid Profile: ?No results for input(s): CHOL, HDL, LDLCALC, TRIG, CHOLHDL, LDLDIRECT in the last 72 hours. ?Thyroid Function Tests: ?No results for input(s): TSH, T4TOTAL, FREET4, T3FREE, THYROIDAB in the last 72 hours. ?Anemia Panel: ?No results for input(s): VITAMINB12, FOLATE, FERRITIN, TIBC, IRON, RETICCTPCT in the  last 72 hours. ?Urine analysis: ?   ?Component Value Date/Time  ? [place] (A) 04/05/2021 1409  ? APPEARANCEUR HAZY (A) 04/05/2021 1409  ? LABSPEC 1.026 04/05/2021 1409  ? PHURINE 5.0 04/05/2021 1409  ?

## 2021-05-24 NOTE — ED Triage Notes (Addendum)
Pt bib EMS from [facility] for hypoglycemia and possible unresponsiveness, CBG 37; dextrose 10 given by EMS, CBG raised to 170. On arrival to ED, CBG 53. No other report given by facility to EMS. Pt hx HIV and AIDS, recent hospital admission for infection. Other EMS VSS ?

## 2021-05-24 NOTE — ED Provider Notes (Signed)
?[facility] ?Provider Note ? ? ?CSN: [id number] ?Arrival date & time: 05/24/21  1930 ? ?  ? ?History ? ?Chief Complaint  ?Patient presents with  ? Hypoglycemia  ? Altered Mental Status  ? ? ?Ricky [name] is a 66 y.o. 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  Patient was recently admitted to the hospital for sepsis.  He was found to have MAI infection.  Patient is currently on rifabutin, azithromycin, ethambutol and Bactrim.  Patient was noted to be confused and altered.  Patient was noted to have a sugar of 30.  Patient was given D10 prior to arrival and blood sugar went up to 150 and came down to 50.  Patient has not been eating and drinking much.  Patient unable to give me much history. ? ?The history is provided by the patient.  ? ?  ? ?Home Medications ?Prior to Admission medications   ?Medication Sig Start Date End Date Taking? Authorizing Provider  ?acetaminophen (TYLENOL) 500 MG tablet Take 500 mg by mouth every 6 (six) hours as needed for moderate pain or headache.    [provider]  ?azithromycin (ZITHROMAX) 500 MG tablet Take 1 tablet (500 mg total) by mouth daily. 04/04/21 03/30/22  Prudencio Pair T, MD  ?chlorpheniramine-HYDROcodone 10-8 MG/5ML Take 5 mLs by mouth every 12 (twelve) hours as needed for up to 10 days for cough. 05/17/21 05/27/21  Deatra James, MD  ?dolutegravir (TIVICAY) 50 MG tablet Take 1 tablet (50 mg total) by mouth daily. 04/04/21   Vu, Johnny Bridge T, MD  ?emtricitabine-tenofovir (TRUVADA) 200-300 MG tablet Take 1 tablet by mouth daily. 04/04/21   Vu, Rockey Situ, MD  ?ethambutol (MYAMBUTOL) 400 MG tablet Take 2.5 tablets (1,000 mg total) by mouth daily. 04/04/21 03/30/22  Prudencio Pair T, MD  ?famotidine (PEPCID) 20 MG tablet Take 1 tablet by mouth 2 times daily. 05/17/21 06/16/21  ShahmehdiValeria Batman, MD  ?feeding supplement (ENSURE ENLIVE / ENSURE PLUS) LIQD Take 237 mLs by mouth 3  (three) times daily between meals. 05/17/21   Shahmehdi, Valeria Batman, MD  ?fluconazole (DIFLUCAN) 200 MG tablet Take 1 tablet (200 mg total) by mouth daily. 04/04/21 03/30/22  Vu, Rockey Situ, MD  ?guaiFENesin-dextromethorphan (ROBITUSSIN DM) 100-10 MG/5ML syrup Take 10 mLs by mouth every 4 (four) hours as needed for cough. 05/17/21   Shahmehdi, Valeria Batman, MD  ?magnesium hydroxide (MILK OF MAGNESIA) 400 MG/5ML suspension Take 30 mLs by mouth daily as needed for mild constipation. 05/17/21   Shahmehdi, Valeria Batman, MD  ?megestrol (MEGACE) 400 MG/10ML suspension Take 10 mLs (400 mg total) by mouth daily. 05/17/21   ShahmehdiValeria Batman, MD  ?mirtazapine (REMERON) 15 MG tablet Take 1 tablet by mouth at bedtime. ?Patient not taking: Reported on 05/10/2021 04/16/21   Esmond Plants, RPH-CPP  ?ondansetron (ZOFRAN-ODT) 4 MG disintegrating tablet Dissolve 1 tablet (4 mg total) by mouth every 12 (twelve) hours. 05/17/21   Shahmehdi, Valeria Batman, MD  ?rifabutin (MYCOBUTIN) 150 MG capsule Take 2 capsules (300 mg total) by mouth daily. 04/04/21 03/30/22  Jabier Mutton, MD  ?sulfamethoxazole-trimethoprim (BACTRIM) 400-80 MG tablet Take 1 tablet by mouth daily. 04/04/21 03/30/22  Jabier Mutton, MD  ?   ? ?Allergies    ?Bactrim [sulfamethoxazole-trimethoprim]   ? ?Review of Systems   ?Review of Systems  ?All other systems reviewed and are negative. ? ?Physical Exam ?Updated Vital Signs ?BP Marland Kitchen)  113/91   Temp 98.3 ?F (36.8 ?C) (Oral)   Resp 18   Ht '6\' 1"'$  (1.854 m)   Wt 63.6 kg   SpO2 97%   BMI 18.50 kg/m?  ?Physical Exam ?Vitals and nursing note reviewed.  ?Constitutional:   ?   Comments: Confused, chronically ill  ?HENT:  ?   Head: Normocephalic.  ?   Nose: Nose normal.  ?   Mouth/Throat:  ?   Mouth: Mucous membranes are dry.  ?Eyes:  ?   Extraocular Movements: Extraocular movements intact.  ?   Pupils: Pupils are equal, round, and reactive to light.  ?Cardiovascular:  ?   Rate and Rhythm: Normal rate and regular rhythm.  ?   Pulses: Normal pulses.  ?   Heart  sounds: Normal heart sounds.  ?Pulmonary:  ?   Effort: Pulmonary effort is normal.  ?   Comments: Crackles bilateral bases ?Abdominal:  ?   Comments: Distended, nontender  ?Genitourinary: ?   Comments: Patient does have a inguinal hernia on the right side that is soft and easily reducible.  Patient has no obvious scrotal cellulitis on exam.  Rectal exam showed stage II sacral decub ulcer ?Musculoskeletal:     ?   General: Normal range of motion.  ?   Cervical back: Normal range of motion and neck supple.  ?Skin: ?   General: Skin is warm.  ?   Capillary Refill: Capillary refill takes less than 2 seconds.  ?Neurological:  ?   Comments: Confused, moving all extremities  ?Psychiatric:  ?   Comments: Unable   ? ? ?ED Results / Procedures / Treatments   ?Labs ?(all labs ordered are listed, but only abnormal results are displayed) ?Labs Reviewed  ?CBG MONITORING, ED - Abnormal; Notable for the following components:  ?    Result Value  ? Glucose-Capillary 53 (*)   ? All other components within normal limits  ?RESP PANEL BY RT-PCR (FLU A&B, COVID) [id number]  ?CULTURE, BLOOD (ROUTINE X 2)  ?CULTURE, BLOOD (ROUTINE X 2)  ?URINE CULTURE  ?CBC WITH DIFFERENTIAL/PLATELET  ?COMPREHENSIVE METABOLIC PANEL  ?LACTIC ACID, PLASMA  ?LACTIC ACID, PLASMA  ?URINALYSIS, ROUTINE W REFLEX MICROSCOPIC  ?CBG MONITORING, ED  ?TROPONIN I (HIGH SENSITIVITY)  ? ? ?EKG ?None ? ?Radiology ?DG Chest Port 1 View ? ?Result Date: 05/24/2021 ?CLINICAL DATA:  Altered mental status.  Hypoglycemia. EXAM: PORTABLE CHEST 1 VIEW COMPARISON:  Chest x-ray 02/16/2021. FINDINGS: The heart and mediastinal contours are unchanged. Aortic calcification. Low lung volumes. Bibasilar streaky airspace opacities likely representing atelectasis. Retrocardiac airspace opacity. No pulmonary edema. No pleural effusion. No pneumothorax. No acute osseous abnormality. IMPRESSION: 1. Low lung volumes with retrocardiac airspace opacity. Followup PA and lateral chest X-ray is  recommended in 3-4 weeks following therapy to ensure resolution and exclude underlying malignancy. 2.  Aortic Atherosclerosis (ICD10-I70.0). Electronically Signed   By: Iven Finn M.D.   On: 05/24/2021 20:17   ? ?Procedures ?Procedures  ? ? ?CRITICAL CARE ?Performed by: Wandra Arthurs ? ? ?Total critical care time: 30 minutes ? ?Critical care time was exclusive of separately billable procedures and treating other patients. ? ?Critical care was necessary to treat or prevent imminent or life-threatening deterioration. ? ?Critical care was time spent personally by me on the following activities: development of treatment plan with patient and/or surrogate as well as nursing, discussions with consultants, evaluation of patient's response to treatment, examination of patient, obtaining history from patient or surrogate, ordering and performing treatments and  interventions, ordering and review of laboratory studies, ordering and review of radiographic studies, pulse oximetry and re-evaluation of patient's condition. ? ? ?Medications Ordered in ED ?Medications  ?dextrose 10 % infusion ( Intravenous New Bag/Given 05/24/21 2009)  ?dextrose 50 % solution 50 mL (50 mLs Intravenous Given 05/24/21 1942)  ?morphine (PF) 4 MG/ML injection 4 mg (4 mg Intravenous Given 05/24/21 2016)  ? ? ?ED Course/ Medical Decision Making/ A&P ?  ?                        ?Medical Decision Making ?CHANSE KAGEL is a 66 y.o. male here presenting with hypoglycemia and altered mental status.  Patient is not diabetic and is not currently on any insulin.  He is getting treatment for MAI infection.  I wonder if his hypoglycemia is secondary to sepsis or to his poor p.o. intake.  At this point we will get CBC and CMP and lactate and cultures and repeat chest x-ray.  We will put patient on D10 drip ?  ?9:46 PM ?Patient is maintaining his sugar around 120 on the D10 drip.  Patient has mild AKI with creatinine 1.5.  Lactate is normal.  Patient CT head did not  show any bleed.  Patient has stage II decub ulcer that does not appear to be infected.  He has an inguinal hernia but that does not appear to be incarcerated.  At this point, hospitalist to admit for hypoglycemia and AKI lik

## 2021-05-24 NOTE — ED Notes (Signed)
First set of Blood cultures sent to the lab. ?

## 2021-05-25 ENCOUNTER — Inpatient Hospital Stay (HOSPITAL_COMMUNITY): Payer: Medicaid Other

## 2021-05-25 DIAGNOSIS — J189 Pneumonia, unspecified organism: Secondary | ICD-10-CM | POA: Diagnosis not present

## 2021-05-25 DIAGNOSIS — E162 Hypoglycemia, unspecified: Secondary | ICD-10-CM | POA: Diagnosis not present

## 2021-05-25 DIAGNOSIS — Z7189 Other specified counseling: Secondary | ICD-10-CM | POA: Insufficient documentation

## 2021-05-25 DIAGNOSIS — N179 Acute kidney failure, unspecified: Secondary | ICD-10-CM | POA: Diagnosis not present

## 2021-05-25 DIAGNOSIS — B20 Human immunodeficiency virus [HIV] disease: Secondary | ICD-10-CM | POA: Diagnosis not present

## 2021-05-25 LAB — TSH: TSH: 4.76 u[IU]/mL — ABNORMAL HIGH (ref 0.350–4.500)

## 2021-05-25 LAB — STREP PNEUMONIAE URINARY ANTIGEN: Strep Pneumo Urinary Antigen: NEGATIVE

## 2021-05-25 LAB — C-REACTIVE PROTEIN: CRP: 17.1 mg/dL — ABNORMAL HIGH (ref <1.0)

## 2021-05-25 LAB — COMPREHENSIVE METABOLIC PANEL
ALT: 50 U/L — ABNORMAL HIGH (ref 0–44)
AST: 90 U/L — ABNORMAL HIGH (ref 15–41)
Albumin: <1.5 g/dL — ABNORMAL LOW (ref 3.5–5.0)
Alkaline Phosphatase: 92 U/L (ref 38–126)
Anion gap: 5 (ref 5–15)
BUN: 24 mg/dL — ABNORMAL HIGH (ref 8–23)
CO2: 19 mmol/L — ABNORMAL LOW (ref 22–32)
Calcium: 8.4 mg/dL — ABNORMAL LOW (ref 8.9–10.3)
Chloride: 108 mmol/L (ref 98–111)
Creatinine, Ser: 1.63 mg/dL — ABNORMAL HIGH (ref 0.61–1.24)
GFR, Estimated: 47 mL/min — ABNORMAL LOW (ref >60)
Glucose, Bld: 134 mg/dL — ABNORMAL HIGH (ref 70–99)
Potassium: 4.3 mmol/L (ref 3.5–5.1)
Sodium: 132 mmol/L — ABNORMAL LOW (ref 135–145)
Total Bilirubin: 1.6 mg/dL — ABNORMAL HIGH (ref 0.3–1.2)
Total Protein: 5.6 g/dL — ABNORMAL LOW (ref 6.5–8.1)

## 2021-05-25 LAB — CBC
HCT: 21 % — ABNORMAL LOW (ref 39.0–52.0)
Hemoglobin: 6.8 g/dL — CL (ref 13.0–17.0)
MCH: 32.9 pg (ref 26.0–34.0)
MCHC: 32.4 g/dL (ref 30.0–36.0)
MCV: 101.4 fL — ABNORMAL HIGH (ref 80.0–100.0)
Platelets: 92 10*3/uL — ABNORMAL LOW (ref 150–400)
RBC: 2.07 MIL/uL — ABNORMAL LOW (ref 4.22–5.81)
RDW: 15.4 % (ref 11.5–15.5)
WBC: 2.3 10*3/uL — ABNORMAL LOW (ref 4.0–10.5)
nRBC: 0 % (ref 0.0–0.2)

## 2021-05-25 LAB — PREPARE RBC (CROSSMATCH)

## 2021-05-25 LAB — HEMOGLOBIN A1C
Hgb A1c MFr Bld: 3.9 % — ABNORMAL LOW (ref 4.8–5.6)
Mean Plasma Glucose: 65.23 mg/dL

## 2021-05-25 LAB — GLUCOSE, CAPILLARY
Glucose-Capillary: 136 mg/dL — ABNORMAL HIGH (ref 70–99)
Glucose-Capillary: 62 mg/dL — ABNORMAL LOW (ref 70–99)
Glucose-Capillary: 70 mg/dL (ref 70–99)
Glucose-Capillary: 93 mg/dL (ref 70–99)

## 2021-05-25 LAB — D-DIMER, QUANTITATIVE: D-Dimer, Quant: 11.87 ug/mL-FEU — ABNORMAL HIGH (ref 0.00–0.50)

## 2021-05-25 LAB — LACTATE DEHYDROGENASE: LDH: 350 U/L — ABNORMAL HIGH (ref 98–192)

## 2021-05-25 LAB — FERRITIN: Ferritin: 2366 ng/mL — ABNORMAL HIGH (ref 24–336)

## 2021-05-25 LAB — CBG MONITORING, ED
Glucose-Capillary: 93 mg/dL (ref 70–99)
Glucose-Capillary: 74 mg/dL (ref 70–99)

## 2021-05-25 LAB — HEMOGLOBIN AND HEMATOCRIT, BLOOD
HCT: 26.1 % — ABNORMAL LOW (ref 39.0–52.0)
Hemoglobin: 8.3 g/dL — ABNORMAL LOW (ref 13.0–17.0)

## 2021-05-25 LAB — CORTISOL: Cortisol, Plasma: 11 ug/dL

## 2021-05-25 LAB — MRSA NEXT GEN BY PCR, NASAL: MRSA by PCR Next Gen: NOT DETECTED

## 2021-05-25 LAB — PROCALCITONIN: Procalcitonin: 4.44 ng/mL

## 2021-05-25 IMAGING — CT CT ABD-PELV W/O CM
3 of 5 series · 11 of 46 positions shown, 16 images · non-contrast
Comparison: [DATE].

CLINICAL DATA: Abdominal pain.

Acute renal insufficiency.
Elevated liver enzymes.
EXAM:
CT ABDOMEN AND PELVIS WITHOUT CONTRAST
TECHNIQUE: Multidetector CT imaging of the abdomen and pelvis was performed
following the standard protocol without IV contrast.
RADIATION DOSE REDUCTION: This exam was performed according to the
departmental dose-optimization program which includes automated
exposure control, adjustment of the mA and/or kV according to
patient size and/or use of iterative reconstruction technique.

[Series 4: ap without · axial · non-contrast · 0.77mm/px · z∈[+1061,+1451]mm · 7 of 105 slices shown, 12 images]
[im 14/105  soft-tissue]
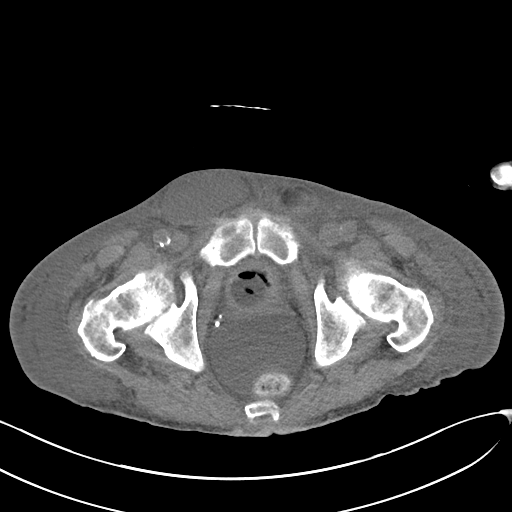
[im 14/105  bone]
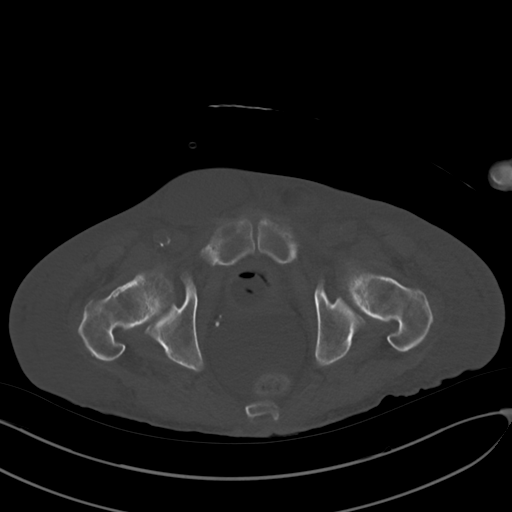
[im 27/105  soft-tissue]
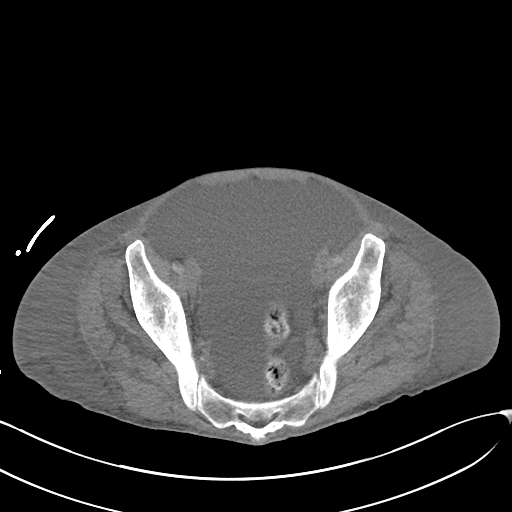
[im 40/105  soft-tissue]
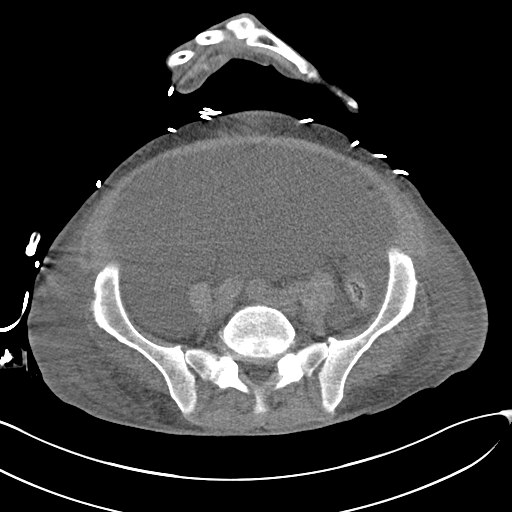
[im 53/105  soft-tissue]
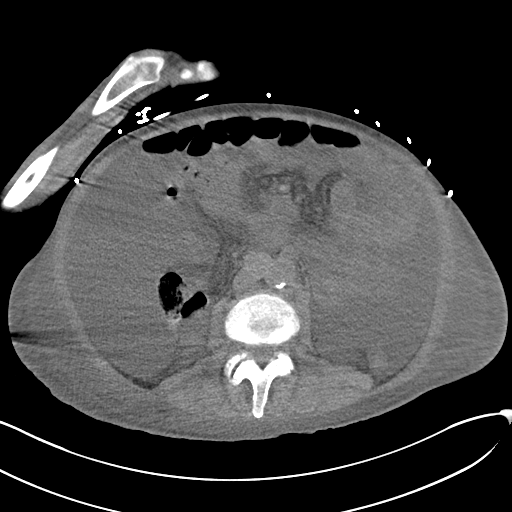
[im 53/105  lung]
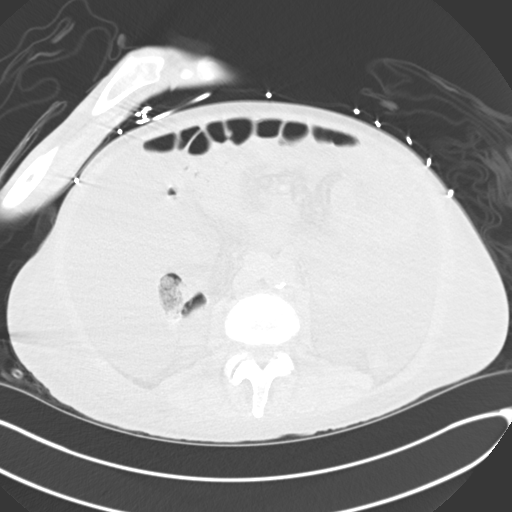
[im 66/105  soft-tissue]
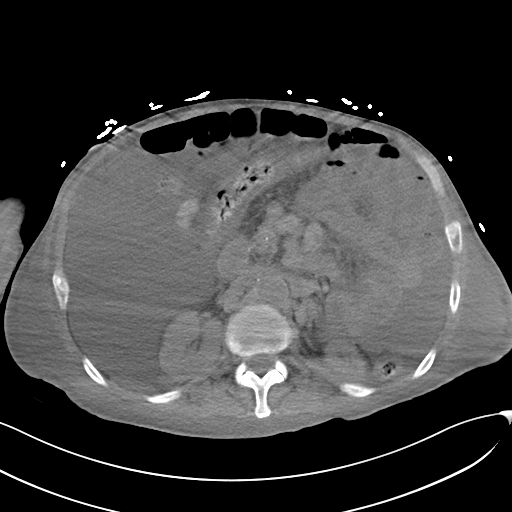
[im 66/105  lung]
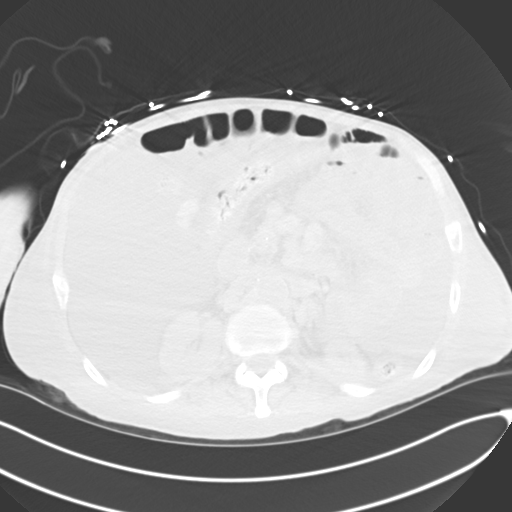
[im 79/105  soft-tissue]
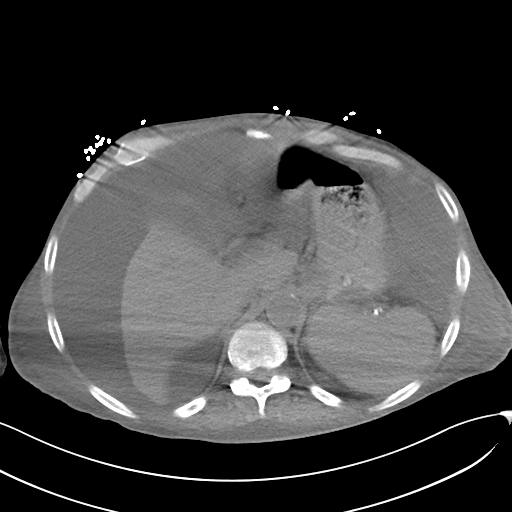
[im 79/105  lung]
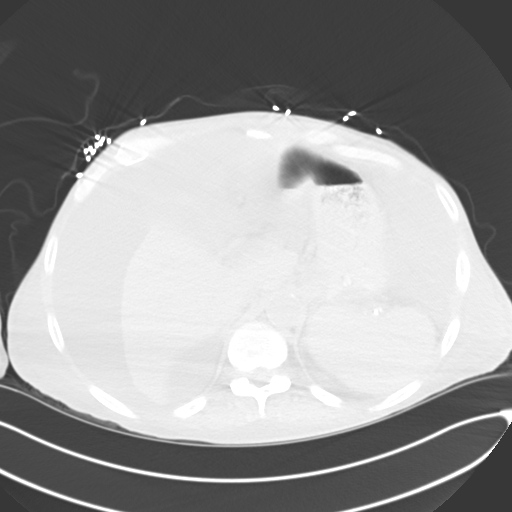
[im 92/105  soft-tissue]
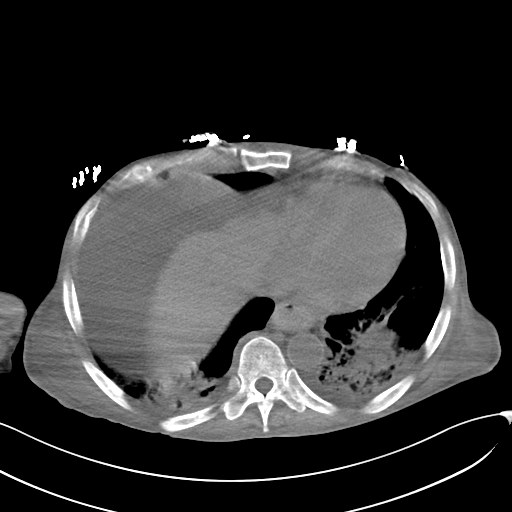
[im 92/105  lung]
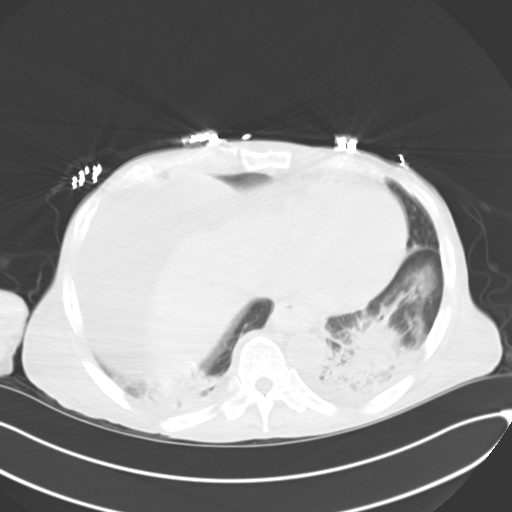

[Series 7: cor · coronal · 0.84mm/px · 3 of 97 slices shown]
[im 33/97  soft-tissue]
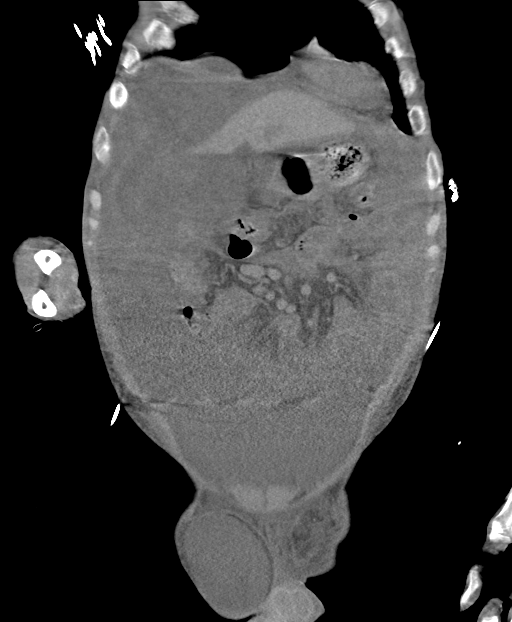
[im 43/97  soft-tissue]
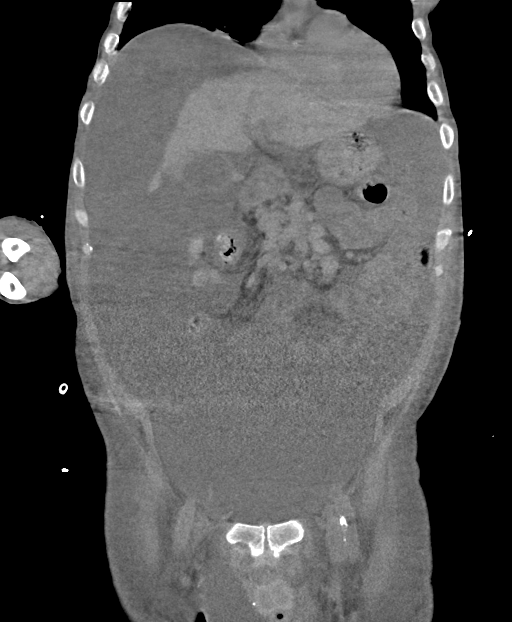
[im 54/97  soft-tissue]
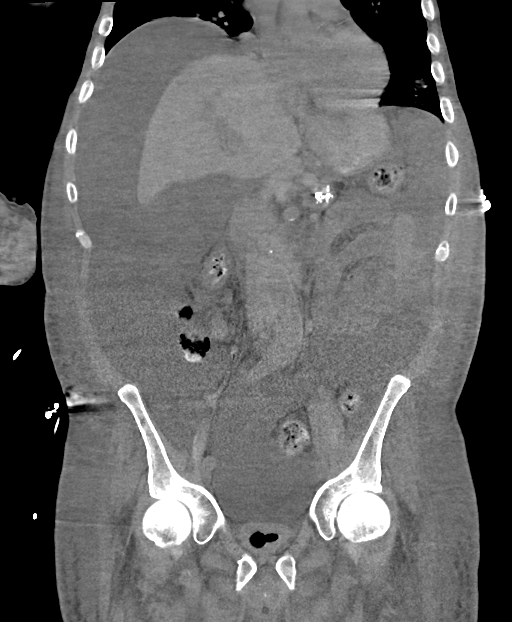

[Series 8: sag · sagittal · 0.57mm/px · 1 of 143 slices shown]
[im 48/143  soft-tissue]
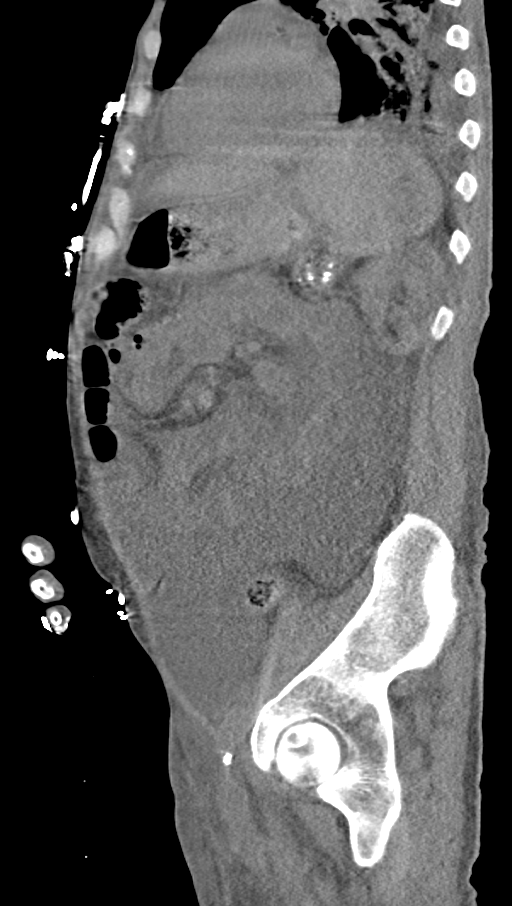

[11 of 46 positions shown; findings below may reference images not displayed]

FINDINGS: Lower chest: Bilateral lower lobe airspace consolidation with
dependent predominance. Additional hazy ground-glass opacities
throughout the visualized lung parenchyma.

Hepatobiliary: No focal liver abnormality is seen. No gallstones,
gallbladder wall thickening, or biliary dilatation.

Pancreas: Calcific pancreatitis.

Spleen: Normal in size without focal abnormality.

Adrenals/Urinary Tract: Adrenal glands are unremarkable. Kidneys are
normal, without renal calculi, focal lesion, or hydronephrosis.
Bladder is decompressed around urinary Foley.

Stomach/Bowel: Stomach is within normal limits. No evidence of
appendicitis. No evidence of bowel wall thickening, distention, or
inflammatory changes.

Vascular/Lymphatic: Aortic atherosclerosis. Diffuse retroperitoneal
lymphadenopathy. Index right periaortic lymph node measuring 2.2 cm,
from prior measurement of 2.4 cm, image 54/105.

Reproductive: Prostate is unremarkable.

Other: Large volume abdominopelvic ascites.  Right hydrocele.

Musculoskeletal: Anasarca.  No acute osseous findings.
IMPRESSION: 1. Diffuse retroperitoneal lymphadenopathy, slightly improved.
2. Bilateral lower lobe airspace consolidation with dependent
predominance. Additional hazy ground-glass opacities throughout the
visualized lung parenchyma. These findings may represent multifocal
pneumonia or postobstructive pneumonitis.
3. The previously seen cavitating lesion in the left lower lobe
represents as soft tissue pulmonary mass measuring 3.2 x 1.7 cm. The
cavitation is no longer seen.
4. Large volume abdominopelvic ascites.
5. Anasarca.
6. Calcific pancreatitis.
7. Right hydrocele.
8. Aortic atherosclerosis.

Aortic Atherosclerosis ([1G]-[1G]).

## 2021-05-25 MED ORDER — DEXAMETHASONE SODIUM PHOSPHATE 10 MG/ML IJ SOLN: 6.0000 mg | INTRAMUSCULAR | Status: DC

## 2021-05-25 MED ORDER — NITROGLYCERIN 0.4 MG SL SUBL: 0.4000 mg | SUBLINGUAL_TABLET | SUBLINGUAL | Status: DC | PRN

## 2021-05-25 MED ORDER — ALUM & MAG HYDROXIDE-SIMETH 200-200-20 MG/5ML PO SUSP: 5.0000 mL | Freq: Four times a day (QID) | ORAL | Status: DC | PRN

## 2021-05-25 MED ORDER — TRAMADOL HCL 50 MG PO TABS
50.0000 mg | ORAL_TABLET | Freq: Two times a day (BID) | ORAL | Status: DC | PRN
  Filled 2021-05-25: qty 1

## 2021-05-25 MED ORDER — LACTATED RINGERS IV BOLUS
1000.0000 mL | Freq: Once | INTRAVENOUS | Status: AC
  Administered 2021-05-25: 1000 mL via INTRAVENOUS

## 2021-05-25 MED ORDER — LORAZEPAM 2 MG/ML IJ SOLN: 0.5000 mg | Freq: Four times a day (QID) | INTRAMUSCULAR | Status: DC | PRN

## 2021-05-25 MED ORDER — DEXTROSE 50 % IV SOLN
INTRAVENOUS | Status: AC
Start: 2021-05-25 — End: 2021-05-25
  Administered 2021-05-25: 25 mL
  Filled 2021-05-25: qty 50

## 2021-05-25 MED ORDER — PANTOPRAZOLE SODIUM 40 MG PO TBEC
40.0000 mg | DELAYED_RELEASE_TABLET | Freq: Every day | ORAL | Status: DC
  Filled 2021-05-25 (×3): qty 1

## 2021-05-25 MED ORDER — SODIUM CHLORIDE 0.9 % IV SOLN
200.0000 mg | Freq: Once | INTRAVENOUS | Status: DC
  Filled 2021-05-25: qty 40

## 2021-05-25 MED ORDER — SODIUM CHLORIDE 0.9% IV SOLUTION: Freq: Once | INTRAVENOUS | Status: AC

## 2021-05-25 MED ORDER — SODIUM CHLORIDE 0.9 % IV SOLN: 100.0000 mg | Freq: Every day | INTRAVENOUS | Status: DC

## 2021-05-25 NOTE — Consult Note (Signed)
? ?                                                                                ?Consultation Note ?Date: 05/25/2021  ? ?Patient Name: Ricky [name]  ?DOB: 01-Sep-1955  MRN: [id number]  Age / Sex: 66 y.o., male  ?PCP: Pcp, No ?Referring Physician: Hosie Poisson, MD ? ?Reason for Consultation: Establishing goals of care ? ?HPI/Patient Profile: 66 y.o. male  with past medical history of  HIV/AIDS with recent CD4 <35, disseminated MAC, history of cryptococcal meningitis, chronic COVID, dysphagia, debility, severe malnutrition, and abnormal lymphadenopathy admitted on 05/24/2021 with hypoglycemia and poor responsiveness at SNF. ? ?Patient was recently discharged to SNF on 05/21/21 and is very cachectic, frail, now with AKI and acute encephalopathy. PMT has been consulted to assist with goals of care conversation.  ? ?Clinical Assessment and Goals of Care: ? ?I have reviewed medical records including EPIC notes, labs and imaging, received report from RN, assessed the patient and then met at the bedside along with his brothers [name] and Ozzie Hoyle, his sister [name], to discuss diagnosis prognosis, GOC, EOL wishes, disposition and options. ? ?I introduced Palliative Medicine as specialized medical care for people living with serious illness. It focuses on providing relief from the symptoms and stress of a serious illness. The goal is to improve quality of life for both the patient and the family. ? ?We discussed a brief life review of the patient and then focused on their current illness.  ? ?Medical History Review and Understanding: ?Reviewed current illness in the setting of multiple chronic comorbidities - family verbalize their understanding. ? 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 He is described as a kind person. Recently was living with his sister as his primary caregiver but became too weak and was at [facility]  since most recent discharge. He is a Engineer, manufacturing. ? ?Functional and Nutritional State: ?Patient has had minimal oral intake for several weeks, taking bites and sips of ensure but mostly refusing and spitting out food. He is reported to have taken a few steps his first day at SNF with therapy but has not mobilized since then. He was talking a lot and cognitively intact just a week ago but has gradually declined at [facility].  ? ?Palliative Symptoms: ?Abdominal/epigastric pain ? ?Code Status: ?Concepts specific to code status, artifical feeding and hydration, and rehospitalization were considered and discussed. ? ?Discussion: ?Patient's brother Ozzie Hoyle initially at the bedside and shares he thought that patient has had a cardiac arrest with CPR in the field. He shares that patient would want to do everything possible to continue living as long as possible. He is concerned about the SNF facility and realizes patient is still at high risk for further complications and death from his current illness. He shares his faith and that he would accept God's will if it is patient's time to pass. We discussed CODE STATUS and I relayed that notes did not indicate patient received CPR. I shared my worry that this would cause more suffering than benefit, understanding evidenced-based poor outcomes in similar hospitalized patients, as  the cause of the arrest is likely associated with chronic/terminal disease rather than a reversible acute cardio-pulmonary event. He would like to update his siblings and discuss together as a family. ? ?I then met with patient's brother [name] and sister [name] at the bedside. Both are in agreement that they do not want patient to return to [place] and are very unsatisfied with the care he has received. We reviewed his current medical illness in detail, clarifying hypoglycemia as cause for unresponsiveness and provided education on the overall care plan, risks, and available resources in an outpatient setting.  Family shares that patient went outside briefly with them in a wheelchair at the facility yesterday, he declined rapidly afterwards. Family has not seen any attempts to assist him with meals at SNF and they would like to take him home with palliative care/home health. Patient's family have a good understanding of palliative and hospice support after previous discussions. I attempted to clarify limited support available in the home, as family understands he needs 24/7 care. Sister is willing to provide this and they are not ready for hospice at this time. [name] would consider feeding tube "if that's what he needs to eat" and feels patient would want this for himself. ? ? ?Hospice and Palliative Care services outpatient were explained and offered.  ? ?Discussed the importance of continued conversation with family and the medical providers regarding overall plan of care and treatment options, ensuring decisions are within the context of the patient?s values and GOCs.  ? ?Questions and concerns were addressed.  Hard Choices booklet left for review. The family was encouraged to call with questions or concerns.  PMT will continue to support holistically.  ? ?  ?SUMMARY OF RECOMMENDATIONS   ?-Full code ?-Full scope treatment ?-Spiritual care consult per patient's brother request ?-Psychosocial and emotional support provided ?-Patient's sister would like to resume caregiving in her home with [facility] and outpatient palliative care, not ready for hospice ?-Patient's brothers would be open to continuing SNF ?-PMT will continue to follow for ongoing goc discussions ? ? ?Prognosis:  ?Very poor prognosis given severe cachexia, malnutrition, multiple infections, failure to thrive and ongoing decline with recurrent hospitalizations ? ?Discharge Planning: To Be Determined  ? ?  ? ?Primary Diagnoses: ?Present on Admission: ? Non-diabetic hypoglycemia ? Protein-calorie malnutrition, severe ? 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 Community acquired pneumonia ? AKI (acute kidney injury) ([place]) ? AIDS (acquired immune deficiency syndrome) ([place]) ? COVID-19 ? Lymphadenopathy ? Pancytopenia ([place]) ? ? ?I have reviewed the medical record, interviewed the patient and family, and examined the patient. The following aspects are pertinent. ? ?Past Medical History:  ?Diagnosis Date  ? Human immunodeficiency virus (HIV) disease ([place]) 03/26/2020  ? ?Social History  ? ?Socioeconomic History  ? Marital status: Married  ?  Spouse name: Not on file  ? Number of children: Not on file  ? Years of education: Not on file  ? Highest education level: Not on file  ?Occupational History  ? Not on file  ?Tobacco Use  ? Smoking status: Former  ?  Types: Cigarettes  ? Smokeless tobacco: Former  ? Tobacco comments:  ?  Smokes 1 - 2 cigarttes a day 04/18/21  ?Substance and Sexual Activity  ? Alcohol use: Yes  ?  Alcohol/week: 14.0 - 21.0 standard drinks  ?  Types: 14 - 21 Cans of beer per week  ? Drug use: Not Currently  ? Sexual activity: Not on file  ?Other Topics  Concern  ? Not on file  ?Social History Narrative  ? Not on file  ? ?Social Determinants of Health  ? ?Financial Resource Strain: Not on file  ?Food Insecurity: Not on file  ?Transportation Needs: Not on file  ?Physical Activity: Not on file  ?Stress: Not on file  ?Social Connections: Not on file  ? ?Family History  ?Problem Relation Age of Onset  ? Hypertension Mother   ? Kidney disease Mother   ? Hypertension Sister   ? Hypertension Brother   ? Kidney cancer Brother   ? Colon cancer Neg Hx   ? Liver cancer Neg Hx   ? Pancreatic cancer Neg Hx   ? Esophageal cancer Neg Hx   ? ?Scheduled Meds: ? sodium chloride   Intravenous Once  ? azithromycin  500 mg Oral Daily  ? dolutegravir  50 mg Oral Daily  ? emtricitabine-tenofovir AF  1 tablet Oral Daily  ? ethambutol  15 mg/kg Oral Daily  ? famotidine  20 mg Oral BID  ? feeding supplement  237 mL Oral TID BM  ? fluconazole  200 mg Oral  Daily  ? megestrol  400 mg Oral Daily  ? mirtazapine  15 mg Oral QHS  ? rifabutin  300 mg Oral Daily  ? sodium chloride flush  3 mL Intravenous Q12H  ? sulfamethoxazole-trimethoprim  1 tablet Oral Daily  ? ?Cornelius Moras

## 2021-05-25 NOTE — Progress Notes (Addendum)
? ? ? [facility] ?                                                                            ? ? ?Ricky [name], is a 66 y.o. male, DOB - [birth year], [id number] ?Admit date - 05/24/2021    ?Outpatient Primary MD for the patient is Pcp, No ? ?LOS - 1  days ? ? ? ?Brief summary  ? ?Ricky [name] is a pleasant 66 y.o. male with medical history significant for HIV/AIDS with recent CD4 <35, disseminated MAC, history of cryptococcal meningitis, chronic COVID, dysphagia, debility, severe malnutrition, and abdominal lymphadenopathy, now presenting from his SNF where he was noted to be poorly responsive and found to have CBG in the 105s.  Patient received IV dextrose prior to the arrival and his CBGs have improved.  Chest x-ray shows retrocardiac opacity suspicious for pneumonia. ? ? ?Assessment & Plan  ? ? ?Assessment and Plan: ? ?Acute metabolic encephalopathy probably secondary to a combination of hypoglycemia, pneumonia,, mild dementia. ?-His mental status slightly improved he is more alert and interactive and complaining of abdominal pain. ?-Patient currently on IV dextrose infusion for hypoglycemia. ?-Unfortunately his A1c is around 3.9, probably secondary to poor oral intake. ?-Urine cultures and blood cultures are pending at this time ? ? ?Left retrocardiac opacity on chest x-ray currently treating as pneumonia. ?Patient on room air with sats in 92 to 93%. ?Patient started on IV Rocephin and Zithromax. ?Nasal cannula oxygen to keep sats greater than 90%. ?Blood cultures to be followed ?Urine for strep pneumonia is negative, urine for Legionella antigen is pending ? ? ? ?Acute kidney injury probably with mild metabolic acidosis secondary to poor oral intake, dehydration, acute prerenal azotemia. ?Patient's baseline creatinine around 1 admitted with a creatinine of 1.58, had not worsened to 1.63 today ?Check for urinary retention, CT abdomen/ultrasound renal to check for hydronephrosis ?IV fluids, renally  dose medications and continue to monitor. ? ? ?HIV/AIDS last CD4 count less than 35. ?Patient is on Tivicay Descovy and Bactrim. ?Continue the same. ? ? ?History of disseminated MAC infection ?Continue with rifabutin, azithromycin and ethambutol ? ? ? ?History of cryptococcal meningitis ?Continue with fluconazole ? ? ?Pancytopenia ?Probably secondary to HIV,. ?Hemoglobin dropped to 6.8 this morning, ordered 1 unit of PRBC transfusion. ?Platelet count around 92,000. ? ? ?Mediastinal  lymphadenopathy ?Patient has  mediastinal and hilar lymphadenopathy. ?Last admission CT surgery consulted for possible biopsy but they felt he is not a candidate for a VATS/excisional mediastinal biopsy.   ?General surgery could not do an excisional biopsy as patient had multiple comorbidities and would need a laparotomy and patient would not be a candidate for it at this time. ?Lymphoma is still on the differential. ? ? ? ? ?Chronic COVID-19 infection ?Patient unable to clear initial infection patient. He completed the course of Molnupiravir last week.  ?He is on RA with sats around 93%.  ? ? ?Dysphagia ?SLP recommended dysphagia 3 diet. ? ? ? ?Anasarca with leg edema ascites probably secondary to hypoalbuminemia. ?Recommend compression stockings and elevation of the legs and will get ultrasound paracentesis after the CT is done. ? ? ? ?Severe malnutrition ?Patient is currently on  Megace, continue the same, liberalize diet ?Nutrition consulted. ? ? ?GERD ?Patient on PPI continue the same ? ? ? ?Elevated liver enzymes ?Get imaging for further evaluation. ? ? ?Hyponatremia ?-Mild, continue to monitor. ? ? ? ?RN Pressure Injury Documentation: ?Pressure Injury 05/20/21 Sacrum Mid Stage 2 -  Partial thickness loss of dermis presenting as a shallow open injury with a red, pink wound bed without slough. pink top layer gone (Active)  ?05/20/21 1800  ?Location: Sacrum  ?Location Orientation: Mid  ?Staging: Stage 2 -  Partial thickness loss of  dermis presenting as a shallow open injury with a red, pink wound bed without slough.  ?Wound Description (Comments): pink top layer gone  ?Present on Admission:   ?Dressing Type Foam - Lift dressing to assess site every shift 05/20/21 2037  ? ? ?  ? ?Estimated body mass index is 18.5 kg/m? as calculated from the following: ?  Height as of this encounter: _0  (1.854 m). ?  Weight as of this encounter: 63.6 kg. ? ?Code Status: FULL CODE.  ?DVT Prophylaxis:  SCDs Start: 05/24/21 2245 ? ? ?Level of Care: Level of care: Telemetry Medical ?Family Communication family at bedside. ? ?Disposition Plan:     Remains inpatient appropriate:  IV antibiotics.  ? ?Procedures:  ?None.  ? ?Consultants:   ?Palliative care ? ?Antimicrobials:  ? ?Anti-infectives (From admission, onward)  ? ? Start     Dose/Rate Route Frequency Ordered Stop  ? 05/26/21 1000  remdesivir 100 mg in sodium chloride 0.9 % 100 mL IVPB  Status:  Discontinued       ?See Hyperspace for full Linked Orders Report.  ? 100 mg ?200 mL/hr over 30 Minutes Intravenous Daily 05/25/21 1106 05/25/21 1203  ? 05/25/21 1213  remdesivir 200 mg in sodium chloride 0.9% 250 mL IVPB  Status:  Discontinued       ?See Hyperspace for full Linked Orders Report.  ? 200 mg ?580 mL/hr over 30 Minutes Intravenous Once 05/25/21 1106 05/25/21 1203  ? 05/25/21 1000  azithromycin (ZITHROMAX) tablet 500 mg       ? 500 mg Oral Daily 05/24/21 2246    ? 05/25/21 1000  dolutegravir (TIVICAY) tablet 50 mg       ? 50 mg Oral Daily 05/24/21 2246    ? 05/25/21 1000  emtricitabine-tenofovir AF (DESCOVY) 200-25 MG per tablet 1 tablet       ? 1 tablet Oral Daily 05/24/21 2246    ? 05/25/21 1000  ethambutol (MYAMBUTOL) tablet 1,000 mg       ? 15 mg/kg ? 66.2 kg Oral Daily 05/24/21 2246    ? 05/25/21 1000  fluconazole (DIFLUCAN) tablet 200 mg       ? 200 mg Oral Daily 05/24/21 2246    ? 05/25/21 1000  rifabutin (MYCOBUTIN) capsule 300 mg       ? 300 mg Oral Daily 05/24/21 2246    ? 05/25/21 1000   sulfamethoxazole-trimethoprim (BACTRIM) 400-80 MG per tablet 1 tablet       ? 1 tablet Oral Daily 05/24/21 2246    ? 05/24/21 2330  cefTRIAXone (ROCEPHIN) 2 g in sodium chloride 0.9 % 100 mL IVPB       ? 2 g ?200 mL/hr over 30 Minutes Intravenous Daily at bedtime 05/24/21 2246 05/29/21 2159  ? ?  ? ? ? ?Medications ? ?Scheduled Meds: ? sodium chloride   Intravenous Once  ? azithromycin  500 mg Oral Daily  ? dolutegravir  50 mg Oral Daily  ? emtricitabine-tenofovir AF  1 tablet Oral Daily  ? ethambutol  15 mg/kg Oral Daily  ? famotidine  20 mg Oral BID  ? feeding supplement  237 mL Oral TID BM  ? fluconazole  200 mg Oral Daily  ? megestrol  400 mg Oral Daily  ? mirtazapine  15 mg Oral QHS  ? rifabutin  300 mg Oral Daily  ? sodium chloride flush  3 mL Intravenous Q12H  ? sulfamethoxazole-trimethoprim  1 tablet Oral Daily  ? ?Continuous Infusions: ? cefTRIAXone (ROCEPHIN)  IV Stopped (05/25/21 0346)  ? ?PRN Meds:.acetaminophen **OR** acetaminophen, guaiFENesin-dextromethorphan, senna-docusate ? ? ? ?Subjective:  ? ?Wassim Kirksey was seen and examined today.  ? ?Objective:  ? ?Vitals:  ? 05/25/21 1145 05/25/21 1200 05/25/21 1213 05/25/21 1215  ?BP: (!) 85/68 92/76  99/79  ?Pulse: 92  93 92  ?Resp: (!) 24 (!) 27 (!) 24 (!) 25  ?Temp:      ?TempSrc:      ?SpO2: 100%  100% 100%  ?Weight:      ?Height:      ? ? ?Intake/Output Summary (Last 24 hours) at 05/25/2021 1233 ?Last data filed at 05/25/2021 1018 ?Gross per 24 hour  ?Intake 730 ml  ?Output --  ?Net 730 ml  ? ?Filed Weights  ? 05/24/21 2022  ?Weight: 63.6 kg  ? ? ? ?Exam ?General exam: ill appearing gentleman, not in distress, mumbling, confused.  ?Respiratory system: Clear to auscultation. Respiratory effort normal. ?Cardiovascular system: S1 & S2 heard, RRR. No JVD, No pedal edema. ?Gastrointestinal system: Abdomen is firm distended, mildly tender. Normal bowel sounds heard. ?Central nervous system: lethargic, confused, mumbling,  ?Extremities: bilateral leg edema.   ?Skin: stage 2 pressure injury. ?Psychiatry: unable to assess.  ? ? ?Data Reviewed:  I have personally reviewed following labs and imaging studies ? ? ?CBC ?Lab Results  ?Component Value Date  ? WBC 2.3 (L) 04/

## 2021-05-25 NOTE — ED Notes (Signed)
Pt c/o chest pain and abd pain. Upon assessment, abd is hard and tender to touch. RN made admitting MD aware via secure chat. ?

## 2021-05-25 NOTE — ED Notes (Signed)
IV team at bedside 

## 2021-05-25 NOTE — ED Notes (Signed)
Blood consent signed by pt's family member, pt unable to sign for himself. ?

## 2021-05-25 NOTE — Progress Notes (Signed)
NEW ADMISSION NOTE ?New Admission Note:  ? ?Arrival Method: Stretcher ?Mental Orientation:  A&O  x2 ?Telemetry: yes box 7 ?Assessment: Completed ?Skin: See assessment ?IV: Infusing when arrived the Locked Right fore arm ?Pain: Chest 6 on faces scale ?Tubes: foley ?Safety Measures: Safety Fall Prevention Plan has been given, discussed and signed ?Admission: Completed ?[facility] Orientation: Patient has been orientated to the room, unit and staff.  ?Family: Sister bedside ? ?Orders have been reviewed and implemented. Will continue to monitor the patient. Call light has been placed within reach and bed alarm has been activated.  ? ?Berneta Levins, RN   ?

## 2021-05-25 NOTE — ED Notes (Addendum)
Pt's upper L arm IV infiltrated. IV removed, RN started new 20g PIV in RAC. IV team order will be placed to obtain better access. Phlebotomy called to collect labs ?

## 2021-05-25 NOTE — Progress Notes (Signed)
?   05/25/21 1751  ?Assess: MEWS Score  ?Temp  ?(unable to get oral or AUnder arm)  ?BP 98/78  ?Pulse Rate 100  ?Resp 17  ?SpO2 (!) 89 %  ?O2 Device Room Air  ?Assess: MEWS Score  ?MEWS Temp 1  ?MEWS Systolic 1  ?MEWS Pulse 0  ?MEWS RR 0  ?MEWS LOC 0  ?MEWS Score 2  ?MEWS Score Color Yellow  ?Notify: Provider  ?Provider Name/Title Karleen Hampshire MD  ?Date Provider Notified 05/25/21  ?Time Provider Notified 1930  ?Notification Type Page  ?Notification Reason Other (Comment) ?([place]ellow [place])  ?Document  ?Patient Outcome Other (Comment) ?(monitoring)  ?Progress note created (see row info) Yes  ? ? ?Unable to Get temp on patient oral or under arm  Night nurse to try rectal ?

## 2021-05-25 NOTE — ED Notes (Signed)
Breakfast order placed ?

## 2021-05-26 ENCOUNTER — Inpatient Hospital Stay (HOSPITAL_COMMUNITY): Payer: Medicaid Other

## 2021-05-26 DIAGNOSIS — A312 Disseminated mycobacterium avium-intracellulare complex (DMAC): Secondary | ICD-10-CM

## 2021-05-26 DIAGNOSIS — B20 Human immunodeficiency virus [HIV] disease: Secondary | ICD-10-CM | POA: Diagnosis not present

## 2021-05-26 DIAGNOSIS — E162 Hypoglycemia, unspecified: Secondary | ICD-10-CM

## 2021-05-26 DIAGNOSIS — D61818 Other pancytopenia: Secondary | ICD-10-CM

## 2021-05-26 DIAGNOSIS — G9340 Encephalopathy, unspecified: Secondary | ICD-10-CM

## 2021-05-26 DIAGNOSIS — J189 Pneumonia, unspecified organism: Secondary | ICD-10-CM | POA: Diagnosis not present

## 2021-05-26 DIAGNOSIS — N179 Acute kidney failure, unspecified: Secondary | ICD-10-CM

## 2021-05-26 DIAGNOSIS — U071 COVID-19: Secondary | ICD-10-CM

## 2021-05-26 DIAGNOSIS — R591 Generalized enlarged lymph nodes: Secondary | ICD-10-CM

## 2021-05-26 DIAGNOSIS — B451 Cerebral cryptococcosis: Secondary | ICD-10-CM

## 2021-05-26 DIAGNOSIS — E43 Unspecified severe protein-calorie malnutrition: Secondary | ICD-10-CM

## 2021-05-26 DIAGNOSIS — R64 Cachexia: Secondary | ICD-10-CM

## 2021-05-26 DIAGNOSIS — R188 Other ascites: Secondary | ICD-10-CM

## 2021-05-26 LAB — GLUCOSE, CAPILLARY
Glucose-Capillary: 100 mg/dL — ABNORMAL HIGH (ref 70–99)
Glucose-Capillary: 100 mg/dL — ABNORMAL HIGH (ref 70–99)
Glucose-Capillary: 104 mg/dL — ABNORMAL HIGH (ref 70–99)
Glucose-Capillary: 97 mg/dL (ref 70–99)
Glucose-Capillary: 99 mg/dL (ref 70–99)
Glucose-Capillary: 70 mg/dL (ref 70–99)
Glucose-Capillary: 64 mg/dL — ABNORMAL LOW (ref 70–99)
Glucose-Capillary: 120 mg/dL — ABNORMAL HIGH (ref 70–99)

## 2021-05-26 LAB — COMPREHENSIVE METABOLIC PANEL
ALT: 53 U/L — ABNORMAL HIGH (ref 0–44)
ALT: 49 U/L — ABNORMAL HIGH (ref 0–44)
AST: 88 U/L — ABNORMAL HIGH (ref 15–41)
AST: 86 U/L — ABNORMAL HIGH (ref 15–41)
Albumin: <1.5 g/dL — ABNORMAL LOW (ref 3.5–5.0)
Albumin: <1.5 g/dL — ABNORMAL LOW (ref 3.5–5.0)
Alkaline Phosphatase: 88 U/L (ref 38–126)
Alkaline Phosphatase: 88 U/L (ref 38–126)
Anion gap: 6 (ref 5–15)
Anion gap: 6 (ref 5–15)
BUN: 26 mg/dL — ABNORMAL HIGH (ref 8–23)
BUN: 25 mg/dL — ABNORMAL HIGH (ref 8–23)
CO2: 17 mmol/L — ABNORMAL LOW (ref 22–32)
CO2: 18 mmol/L — ABNORMAL LOW (ref 22–32)
Calcium: 8.6 mg/dL — ABNORMAL LOW (ref 8.9–10.3)
Calcium: 8.4 mg/dL — ABNORMAL LOW (ref 8.9–10.3)
Chloride: 111 mmol/L (ref 98–111)
Chloride: 109 mmol/L (ref 98–111)
Creatinine, Ser: 1.7 mg/dL — ABNORMAL HIGH (ref 0.61–1.24)
Creatinine, Ser: 1.68 mg/dL — ABNORMAL HIGH (ref 0.61–1.24)
GFR, Estimated: 45 mL/min — ABNORMAL LOW (ref >60)
GFR, Estimated: 45 mL/min — ABNORMAL LOW (ref >60)
Glucose, Bld: 90 mg/dL (ref 70–99)
Glucose, Bld: 103 mg/dL — ABNORMAL HIGH (ref 70–99)
Potassium: 4.3 mmol/L (ref 3.5–5.1)
Potassium: 4.5 mmol/L (ref 3.5–5.1)
Sodium: 134 mmol/L — ABNORMAL LOW (ref 135–145)
Sodium: 133 mmol/L — ABNORMAL LOW (ref 135–145)
Total Bilirubin: 1.8 mg/dL — ABNORMAL HIGH (ref 0.3–1.2)
Total Bilirubin: 1.4 mg/dL — ABNORMAL HIGH (ref 0.3–1.2)
Total Protein: 5.7 g/dL — ABNORMAL LOW (ref 6.5–8.1)
Total Protein: 5.4 g/dL — ABNORMAL LOW (ref 6.5–8.1)

## 2021-05-26 LAB — CBC WITH DIFFERENTIAL/PLATELET
Abs Immature Granulocytes: 0.7 10*3/uL — ABNORMAL HIGH (ref 0.00–0.07)
Band Neutrophils: 20 %
Basophils Absolute: 0 10*3/uL (ref 0.0–0.1)
Basophils Relative: 0 %
Eosinophils Absolute: 0 10*3/uL (ref 0.0–0.5)
Eosinophils Relative: 0 %
HCT: 25 % — ABNORMAL LOW (ref 39.0–52.0)
Hemoglobin: 8 g/dL — ABNORMAL LOW (ref 13.0–17.0)
Lymphocytes Relative: 1 %
Lymphs Abs: 0 10*3/uL — ABNORMAL LOW (ref 0.7–4.0)
MCH: 29.9 pg (ref 26.0–34.0)
MCHC: 32 g/dL (ref 30.0–36.0)
MCV: 93.3 fL (ref 80.0–100.0)
Metamyelocytes Relative: 9 %
Monocytes Absolute: 0.1 10*3/uL (ref 0.1–1.0)
Monocytes Relative: 3 %
Myelocytes: 11 %
Neutro Abs: 2.7 10*3/uL (ref 1.7–7.7)
Neutrophils Relative %: 56 %
Platelets: 67 10*3/uL — ABNORMAL LOW (ref 150–400)
RBC: 2.68 MIL/uL — ABNORMAL LOW (ref 4.22–5.81)
RDW: 22.2 % — ABNORMAL HIGH (ref 11.5–15.5)
WBC: 3.5 10*3/uL — ABNORMAL LOW (ref 4.0–10.5)
nRBC: 0.6 % — ABNORMAL HIGH (ref 0.0–0.2)

## 2021-05-26 LAB — BLOOD GAS, ARTERIAL
Acid-base deficit: 5.5 mmol/L — ABNORMAL HIGH (ref 0.0–2.0)
Bicarbonate: 18 mmol/L — ABNORMAL LOW (ref 20.0–28.0)
Drawn by: 5262
O2 Saturation: 98.9 %
Patient temperature: 36.7
pCO2 arterial: 29 mmHg — ABNORMAL LOW (ref 32–48)
pH, Arterial: 7.4 (ref 7.35–7.45)
pO2, Arterial: 166 mmHg — ABNORMAL HIGH (ref 83–108)

## 2021-05-26 LAB — MAGNESIUM: Magnesium: 1.8 mg/dL (ref 1.7–2.4)

## 2021-05-26 LAB — T4, FREE: Free T4: 1.28 ng/dL — ABNORMAL HIGH (ref 0.61–1.12)

## 2021-05-26 LAB — TYPE AND SCREEN
ABO/RH(D): O POS
Antibody Screen: NEGATIVE
Unit division: 0

## 2021-05-26 LAB — BPAM RBC
Blood Product Expiration Date: 202305262359
ISSUE DATE / TIME: 202304221451
Unit Type and Rh: 5100

## 2021-05-26 LAB — URINALYSIS, COMPLETE (UACMP) WITH MICROSCOPIC
Bilirubin Urine: NEGATIVE
Glucose, UA: NEGATIVE mg/dL
Ketones, ur: NEGATIVE mg/dL
Nitrite: NEGATIVE
Protein, ur: 100 mg/dL — AB
Specific Gravity, Urine: 1.027 (ref 1.005–1.030)
pH: 5 (ref 5.0–8.0)

## 2021-05-26 LAB — CBC
HCT: 23.3 % — ABNORMAL LOW (ref 39.0–52.0)
Hemoglobin: 7.6 g/dL — ABNORMAL LOW (ref 13.0–17.0)
MCH: 30.3 pg (ref 26.0–34.0)
MCHC: 32.6 g/dL (ref 30.0–36.0)
MCV: 92.8 fL (ref 80.0–100.0)
Platelets: 51 10*3/uL — ABNORMAL LOW (ref 150–400)
RBC: 2.51 MIL/uL — ABNORMAL LOW (ref 4.22–5.81)
RDW: 22.5 % — ABNORMAL HIGH (ref 11.5–15.5)
WBC: 2.9 10*3/uL — ABNORMAL LOW (ref 4.0–10.5)
nRBC: 0 % (ref 0.0–0.2)

## 2021-05-26 IMAGING — DX DG CHEST 1V PORT
1 series · 1 of 1 positions shown · non-contrast
Comparison: None.

CLINICAL DATA: Chest pain

EXAM:
PORTABLE CHEST 1 VIEW

[chest ap]
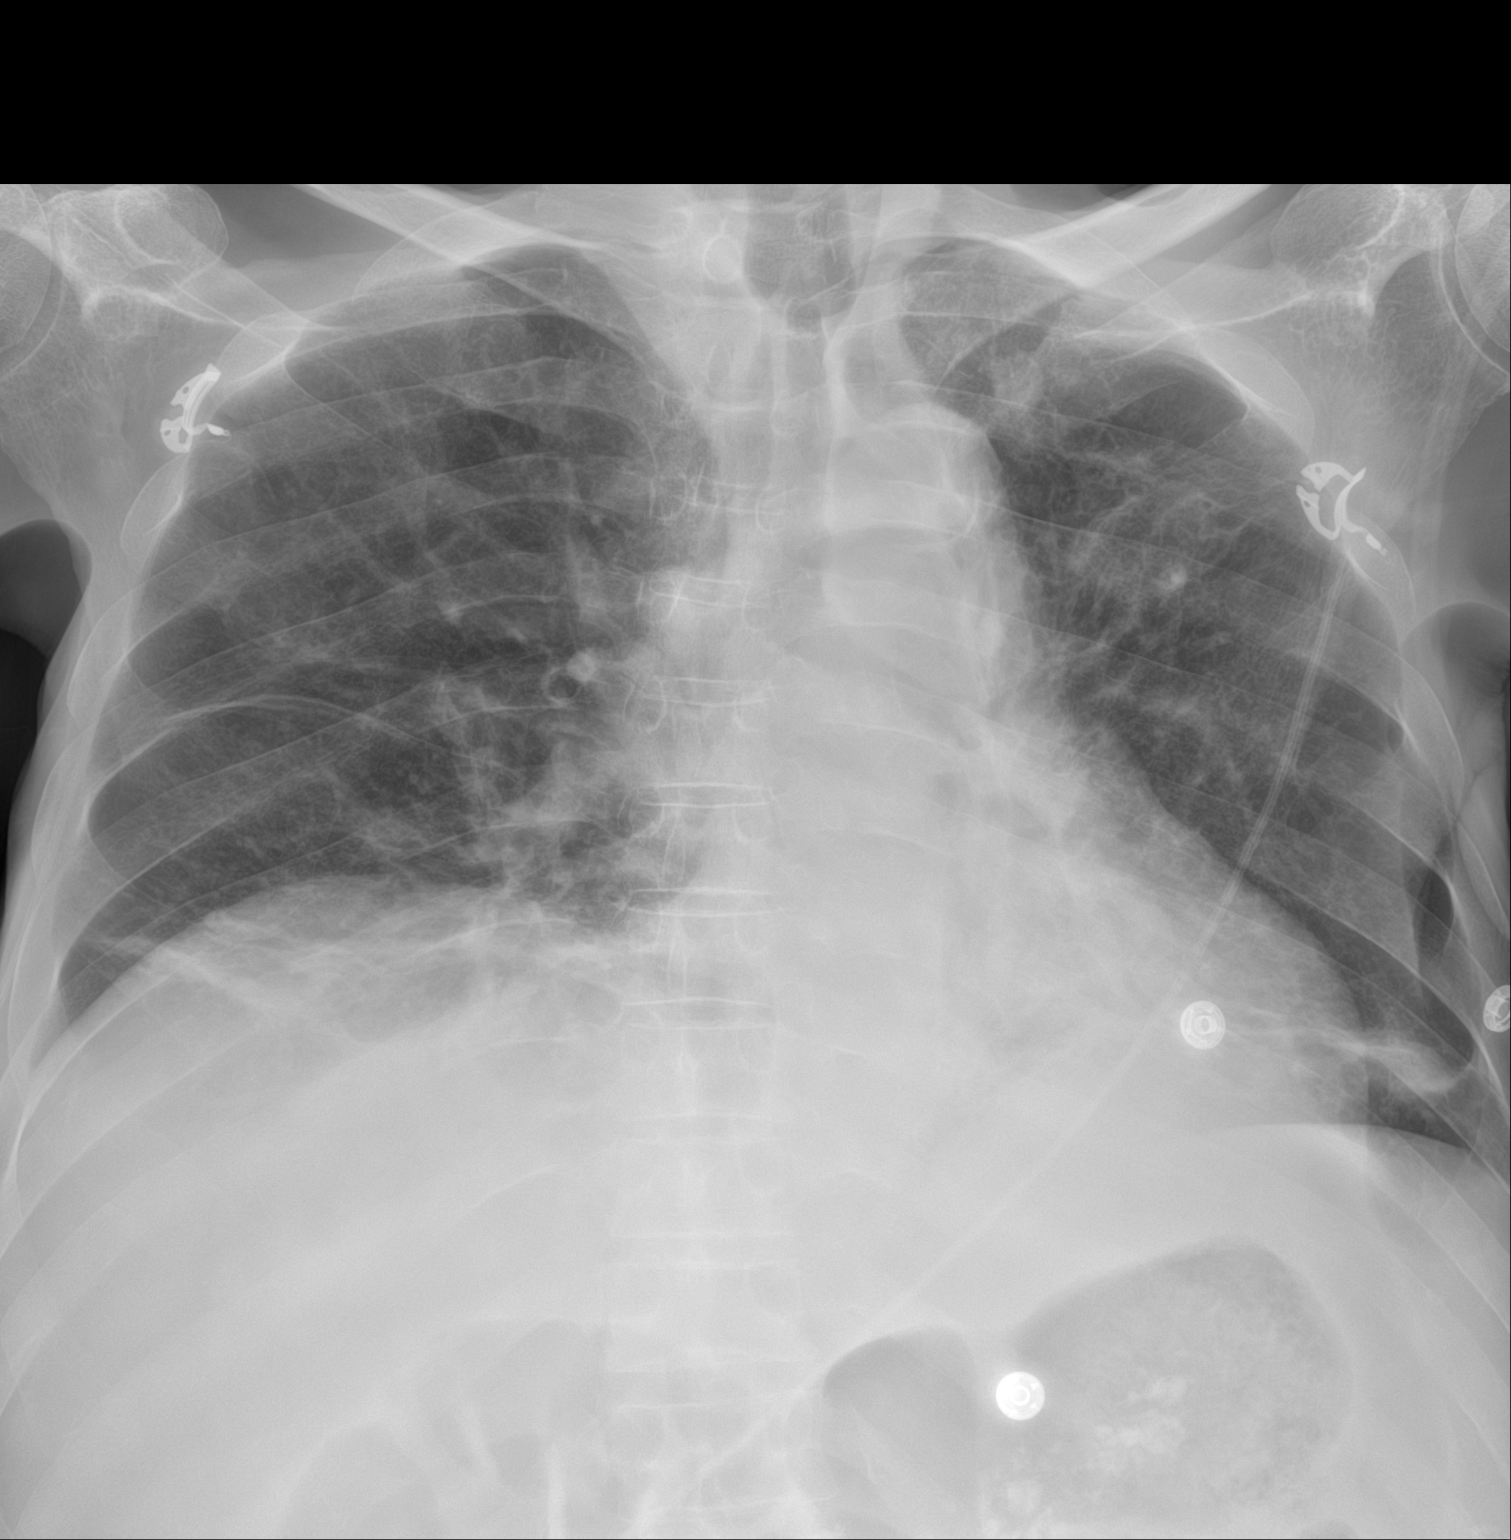

[1 of 1 positions shown; findings below may reference images not displayed]

FINDINGS: Bibasilar atelectasis with area of left retrocardiac increased
opacity. No pleural effusion or pneumothorax. Cardiomediastinal
contours are normal.
IMPRESSION: Left retrocardiac opacities may indicate atelectasis or developing
consolidation.

## 2021-05-26 IMAGING — CT CT CHEST W/O CM
2 of 4 series · 12 of 36 positions shown, 15 images · non-contrast
Comparison: AP chest [DATE], CT abdomen and pelvis [DATE];
CT chest [DATE]

CLINICAL DATA: Chest wall mass or lump. Left lower lobe
anterolateral soft tissue nodule on CT abdomen and pelvis yesterday.



[Series 3: chest wo · axial · 0.74mm/px · z∈[+491,+753]mm · 9 of 155 slices shown, 12 images]
[im 12/155  mediastinal]
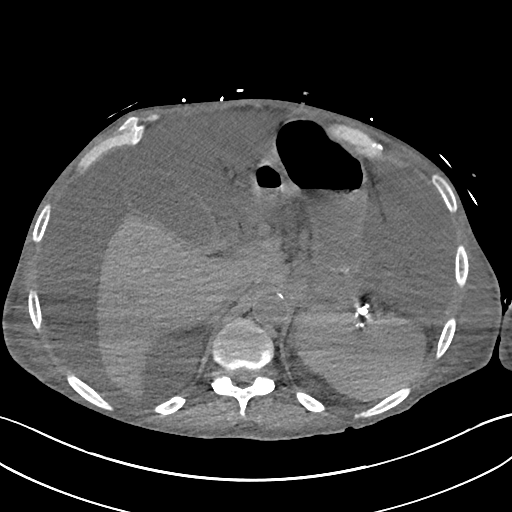
[im 12/155  lung]
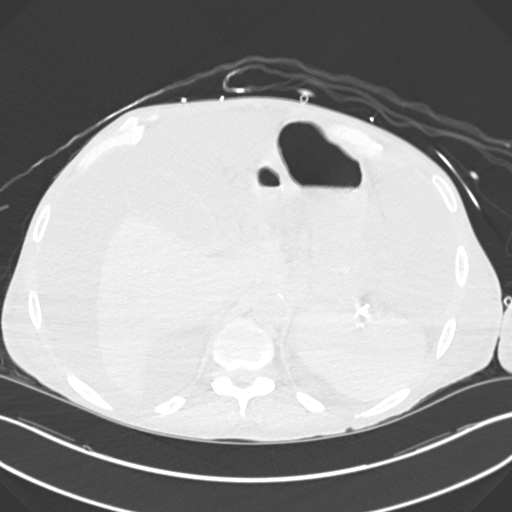
[im 36/155  lung]
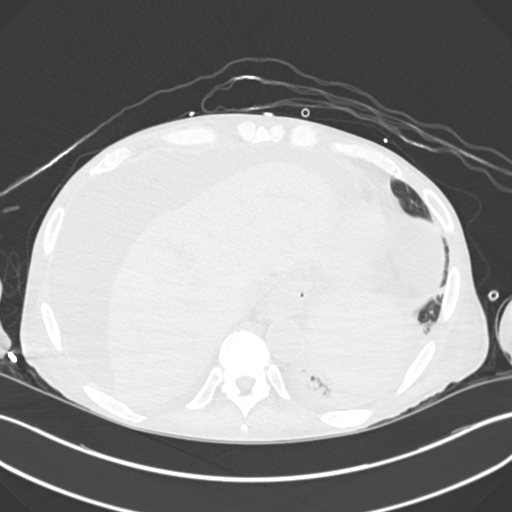
[im 48/155  lung]
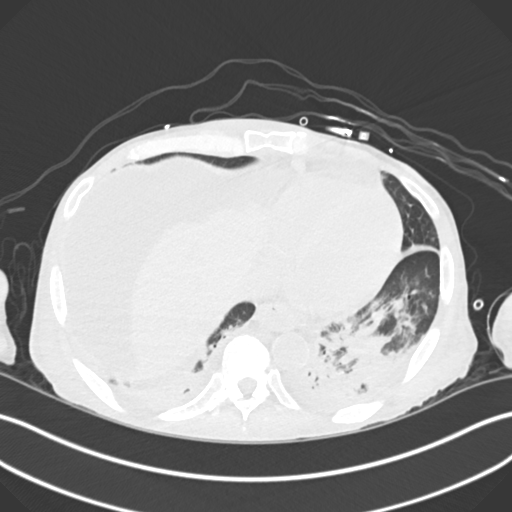
[im 60/155  lung]
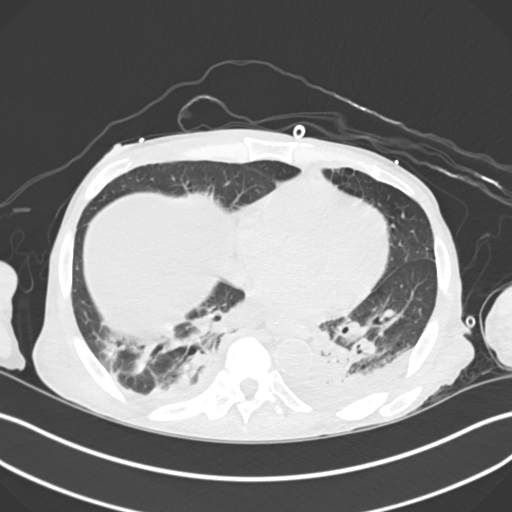
[im 83/155  mediastinal]
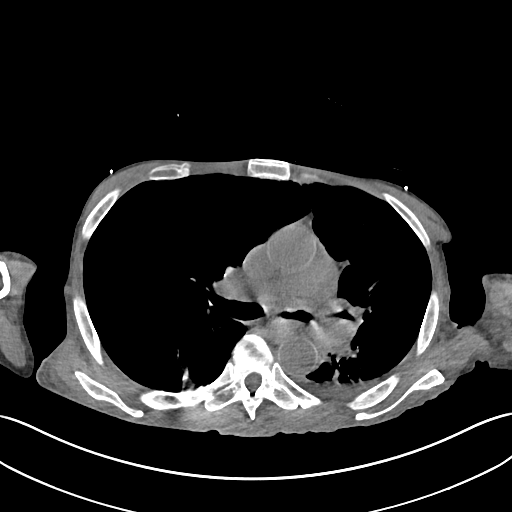
[im 83/155  lung]
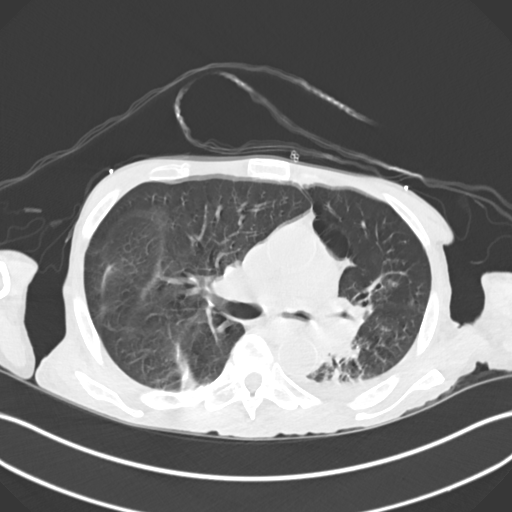
[im 95/155  lung]
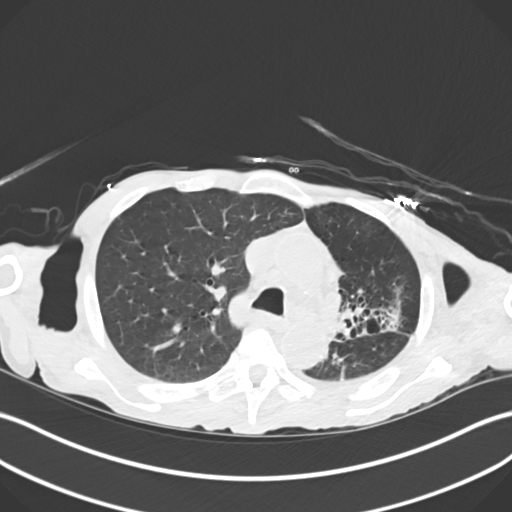
[im 107/155  lung]
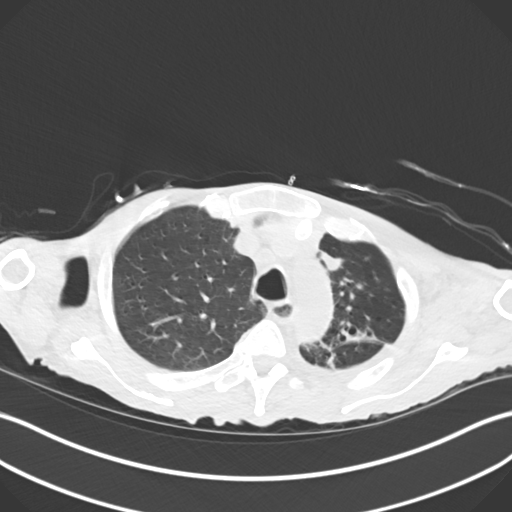
[im 131/155  lung]
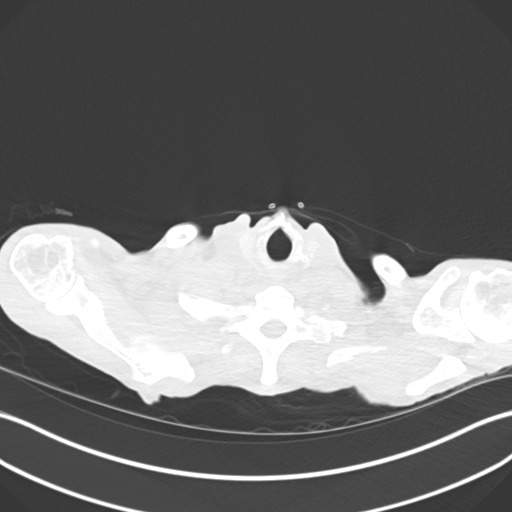
[im 143/155  mediastinal]
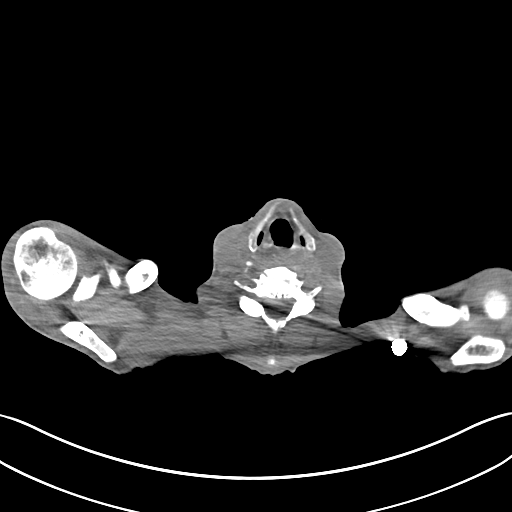
[im 143/155  lung]
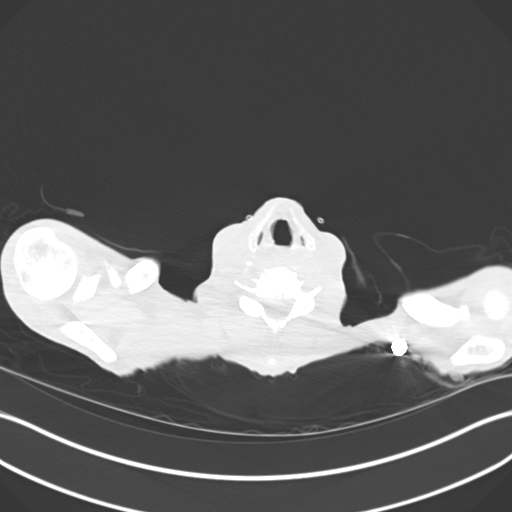

[Series 6: cor · coronal · 0.69mm/px · 3 of 133 slices shown]
[im 27/133  lung]
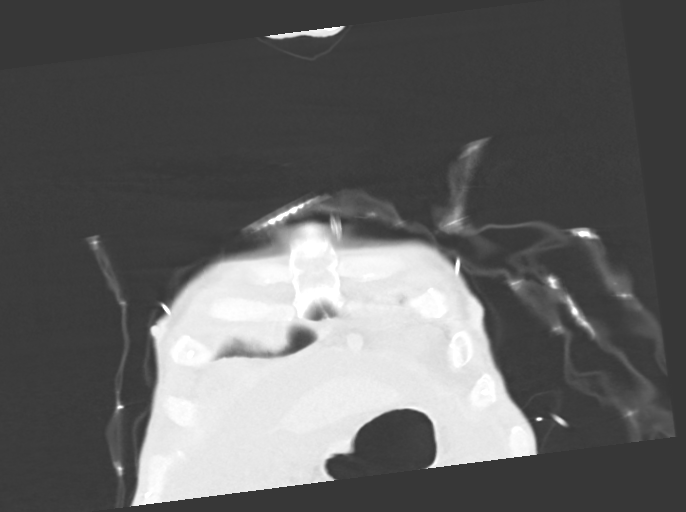
[im 53/133  lung]
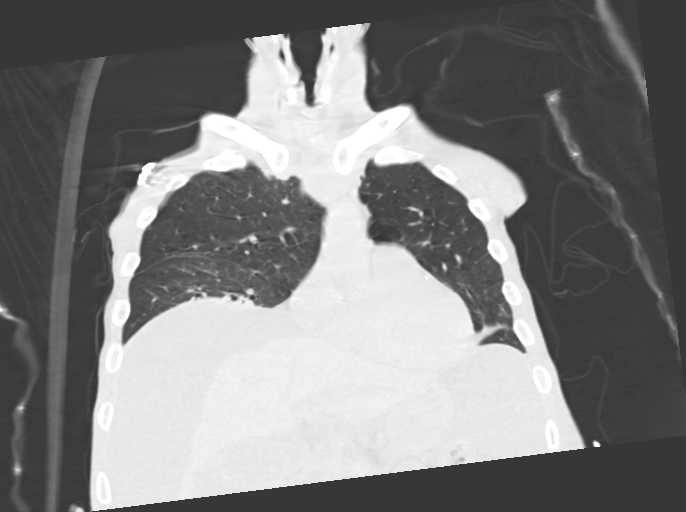
[im 80/133  lung]
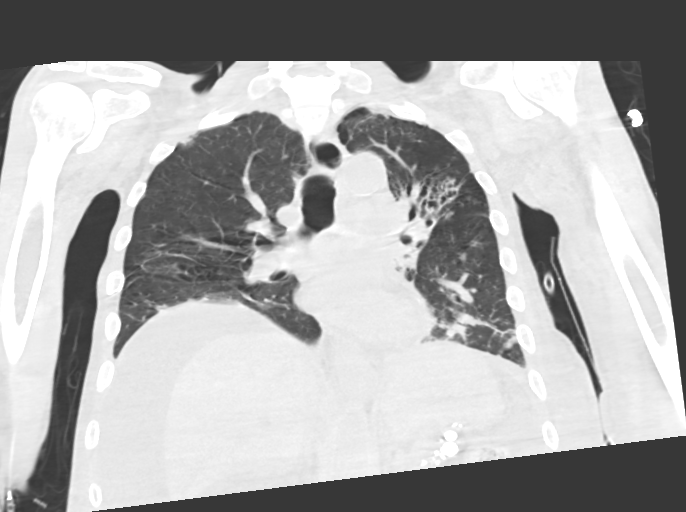

[12 of 36 positions shown; findings below may reference images not displayed]

FINDINGS: Cardiovascular: Heart size is normal. Trace pericardial fluid is
similar to prior. The ascending aorta measures up to 3.5 cm not
aneurysmal. Mild thoracic aortic atherosclerotic calcifications.

Mediastinum/Nodes: No axillary lymphadenopathy. Subcarinal lymph
node measures approximately 3.3 cm in AP dimension, similar to
prior. The prior right-sided low-density within this lymph node
appears smaller (axial series 3, image 82), likely a smaller region
of prior necrosis. Lack of IV contrast limits evaluation of the hila
for lymphadenopathy. Left hilar enlarged lymph nodes are grossly
similar to prior. Reference left hilar lymph node measuring 1.6 cm
in AP dimension previously measured approximately 1.8 cm (axial
series 2, image 78).

The thyroid is unremarkable.  Mildly patulous esophagus.

Lungs/Pleura: The central airways are patent. There are moderate
centrilobular emphysematous changes.

There are decreased lung volumes compared to prior. This is likely
the cause for slightly different shape but overall similar size of
the previously seen superior left upper lobe nodule, measuring up to
approximately 10 x 11 x 12 mm (axial series 4, image 45 and sagittal
series 7, image 137), previously 10 x 11 x 13 mm. Just inferior
anterior and medial to this, there is a 15 x 10 mm nodule that is
unchanged from prior.

More inferior left upper lobe 8 mm nodule is increased from 6 mm
previously (axial image 54). On the prior [DATE] CT there was a
cavitary nodule measuring up to 1.7 cm with soft tissue density
nodules extending anteriorly and posterolaterally from the same
region. As seen on yesterday's CT of the abdomen there is now more
confluent soft tissue density seen in this region, with a 2.0 x
x 1.4 cm (transverse by AP by craniocaudal) nodular density now
seen. Overall this appears to represent soft tissue density filling
in the entire region of the previously seen cavitary nodule and
smaller peripheral nodular densities.

New small left-greater-than-right pleural effusions.

Posterior left upper lobe bronchiectasis is similar to prior, with
slightly worsened bronchial wall thickening. There is increased
medial bilateral lower lobe consolidation around the mildly dilated
airways, suggesting an infectious/inflammatory process. No
pneumothorax.

Upper Abdomen: There is again a minimally nodular contour of the
liver. Moderate upper abdominal ascites. Diffuse calcifications
within the partially visualized pancreas, likely the sequela of
chronic calcific pancreatitis.

There is again anasarca.

Musculoskeletal: Mild dextrocurvature of the mid to upper thoracic
spine. Mild multilevel degenerative disc changes. Moderate anterior
C5-6 disc space narrowing and endplate osteophytes.
IMPRESSION: Compared to [DATE]:

1. And anterolateral left lower lobe cavitary nodule previously
measuring up to 1.7 cm with additional soft tissue density nodules
extending anteriorly and posterolaterally is now replaced by a more
confluent and larger oval soft tissue density spanning the region of
the cavitary nodule and prior smaller linearly distributed
peripheral nodules. Additional left upper lobe 12 mm and 15 mm
nodules appear unchanged. Again these all may represent
infectious/inflammatory process however malignancy cannot be
entirely excluded.
2. Within the limitations of lack of IV contrast, grossly stable
mediastinal and hilar lymphadenopathy. Interval decrease in
suggesting necrosis of the right aspect of the subcarinal lymph
node.
3. New small left-greater-than-right pleural effusions.
4. There is again bronchiectasis of the posterior left upper lobe
and bilateral medial lower lobes. Increased associated peripheral
bronchial wall thickening suggesting a worsened
infectious/inflammatory process.

Aortic Atherosclerosis ([AS]-[AS]) and Emphysema ([AS]-[AS]).

## 2021-05-26 MED ORDER — SODIUM CHLORIDE 0.9 % IV SOLN
3.0000 g | Freq: Four times a day (QID) | INTRAVENOUS | Status: DC
  Administered 2021-05-26 – 2021-05-29 (×12): 3 g via INTRAVENOUS
  Filled 2021-05-26 (×15): qty 8

## 2021-05-26 MED ORDER — SODIUM CHLORIDE 0.9 % IV SOLN
100.0000 mg | Freq: Every day | INTRAVENOUS | Status: AC
  Administered 2021-05-27 – 2021-05-30 (×4): 100 mg via INTRAVENOUS
  Filled 2021-05-26 (×6): qty 20

## 2021-05-26 MED ORDER — CHLORHEXIDINE GLUCONATE CLOTH 2 % EX PADS
6.0000 | MEDICATED_PAD | Freq: Every day | CUTANEOUS | Status: DC
  Administered 2021-05-26 – 2021-05-31 (×6): 6 via TOPICAL

## 2021-05-26 MED ORDER — FLUCONAZOLE IN SODIUM CHLORIDE 200-0.9 MG/100ML-% IV SOLN
200.0000 mg | INTRAVENOUS | Status: DC
  Administered 2021-05-26 – 2021-05-31 (×6): 200 mg via INTRAVENOUS
  Filled 2021-05-26 (×6): qty 100

## 2021-05-26 MED ORDER — DEXAMETHASONE SODIUM PHOSPHATE 10 MG/ML IJ SOLN
6.0000 mg | Freq: Every day | INTRAMUSCULAR | Status: DC
  Administered 2021-05-26 – 2021-05-31 (×6): 6 mg via INTRAVENOUS
  Filled 2021-05-26: qty 0.6
  Filled 2021-05-26 (×2): qty 1
  Filled 2021-05-26: qty 0.6
  Filled 2021-05-26 (×2): qty 1

## 2021-05-26 MED ORDER — DEXTROSE 50 % IV SOLN: 12.5000 g | INTRAVENOUS | Status: AC

## 2021-05-26 MED ORDER — SODIUM CHLORIDE 0.9 % IV SOLN
200.0000 mg | Freq: Once | INTRAVENOUS | Status: AC
  Administered 2021-05-26: 200 mg via INTRAVENOUS
  Filled 2021-05-26: qty 40

## 2021-05-26 MED ORDER — SODIUM CHLORIDE 0.9 % IV SOLN
100.0000 mg | Freq: Every day | INTRAVENOUS | Status: DC
  Filled 2021-05-26: qty 20

## 2021-05-26 MED ORDER — DEXTROSE 50 % IV SOLN
INTRAVENOUS | Status: AC
  Administered 2021-05-26: 50 mL
  Filled 2021-05-26: qty 50

## 2021-05-26 MED ORDER — SODIUM CHLORIDE 0.9 % IV BOLUS
500.0000 mL | Freq: Once | INTRAVENOUS | Status: AC
  Administered 2021-05-26: 500 mL via INTRAVENOUS

## 2021-05-26 MED ORDER — DEXTROSE IN LACTATED RINGERS 5 % IV SOLN: INTRAVENOUS | Status: DC

## 2021-05-26 MED ORDER — LACTATED RINGERS IV BOLUS
1000.0000 mL | Freq: Once | INTRAVENOUS | Status: AC
  Administered 2021-05-26: 1000 mL via INTRAVENOUS

## 2021-05-26 NOTE — Progress Notes (Signed)
? ? ? [facility] ?                                                                            ? ? ?Ricky [name], is a 66 y.o. male, DOB - 1955-08-09, [id number] ?Admit date - 05/24/2021    ?Outpatient Primary MD for the patient is Pcp, No ? ?LOS - 2  days ? ? ? ?Brief summary  ? ?Ricky [name] is a pleasant 66 y.o. male with medical history significant for HIV/AIDS with recent CD4 <35, disseminated MAC, history of cryptococcal meningitis, chronic COVID, dysphagia, debility, severe malnutrition, and abdominal lymphadenopathy, now presenting from his SNF where he was noted to be poorly responsive and found to have CBG in the 43s.  Patient received IV dextrose prior to the arrival and his CBGs have improved.  Chest x-ray shows retrocardiac opacity suspicious for pneumonia. ? ? ?Assessment & Plan  ? ? ?Assessment and Plan: ? ?Acute metabolic encephalopathy probably secondary to a combination of hypoglycemia, pneumonia,, mild dementia. ?-pts  mental status continues to worsen , currently not following commands.  ?-Patient on IV dextrose infusion for hypoglycemia. ?-Unfortunately his A1c is around 3.9, probably secondary to poor oral intake. ?-Urine cultures and blood cultures are pending at this time ? ? ?Sepsis/ hypothermia/ hypotensive/ tachypnea/ tachycardia/ AKI/ AMS present on admission ?Acute respiratory failure with hypoxia requiring 2 lit of [place] oxygen.  ?Left retrocardiac opacity on chest x-ray currently treating as pneumonia./ COVID pneumonia ?Patient currently hypoxic requiring about 2 lit of [place] oxygen.  ?Patient started on IV Rocephin and Zithromax., transitioning to Unasyn for anaerobic coverage.  ?Nasal cannula oxygen to keep sats greater than 90%, currently requiring about 2 lit/min.  ?Blood cultures to be followed. ?Urine for strep pneumonia is negative, urine for Legionella antigen is pending. ?He received IV decadron and IV remdesevir for COVID as per ID recommendations.  ?CT chest without  contrast ordered for further evaluation.  ? ? ?Acute kidney injury probably with mild metabolic acidosis secondary to poor oral intake, dehydration, prerenal azotemia. ?Patient's baseline creatinine around 1 admitted with a creatinine of 1.58, had  worsened to 1.63 to 1.68. ?Check for urinary retention, CT abdomen/ultrasound renal to check for hydronephrosis ?IV fluids, renally dose medications and continue to monitor. ? ? ?HIV/AIDS last CD4 count less than 35. ?Patient is on Tivicay Descovy and Bactrim. ?Continue the same. ID on board.  ? ? ?History of disseminated MAC infection ?Continue with rifabutin, azithromycin and ethambutol ? ? ?History of cryptococcal meningitis ?Continue with fluconazole ? ? ?Pancytopenia ?Probably secondary to HIV, infection.  ?S/p 1 unit of prbc transfusion, for hemoglobin of 6.8, improved to 7.6.  ?Thrombocytopenia worsening to 51,000, probably from infection.  ? ? ?Mediastinal / Extensive retroperitoneal  lymphadenopathy ?Patient has  mediastinal and hilar and extensive retroperitoneal lymphadenopathy. ?Last admission CT surgery consulted for possible biopsy but they felt he is not a candidate for a VATS/excisional mediastinal biopsy.   ?General surgery could not do an excisional biopsy as patient had multiple comorbidities and would need a laparotomy and patient would not be a candidate for it at this time. ?Lymphoma is still on the differential. ? ? ? ?Left lower lobe  lung mass:  ?Unclear etiology, possible lymphoma. Will request pulmonary consult for further evaluation.  ? ? ?Chronic COVID-19 infection/ COVID pneumonia:  ?Patient unable to clear initial infection patient. He completed the course of Molnupiravir last week.  ?Discussed with Dr Tommy Medal , recommend IV steroids and IV remdesivir.  ? ? ? ?Dysphagia ?SLP today recommending NPO.  ? ? ? ?Anasarca with leg edema ascites probably secondary to hypoalbuminemia. ?Recommend compression stockings and elevation of the legs  ?CT  abdomen and pelvis shows large volume ascites, US paracentesis ordered.  ?Fluid to be sent for analysis.  ? ? ? ?Severe malnutrition ?Patient is currently on Megace, unable to take any oral meds, not safe due to altered mental status.  ? ? ?GERD ?On protonix which is on hold.  ? ? ? ?Elevated liver enzymes ?Get imaging for further evaluation. ? ? ?Hyponatremia ?-Mild, continue to monitor. ? ? ? ?RN Pressure Injury Documentation: ?Pressure Injury 05/20/21 Sacrum Mid Stage 2 -  Partial thickness loss of dermis presenting as a shallow open injury with a red, pink wound bed without slough. pink top layer gone (Active)  ?05/20/21 1800  ?Location: Sacrum  ?Location Orientation: Mid  ?Staging: Stage 2 -  Partial thickness loss of dermis presenting as a shallow open injury with a red, pink wound bed without slough.  ?Wound Description (Comments): pink top layer gone  ?Present on Admission:   ?Dressing Type Foam - Lift dressing to assess site every shift 05/26/21 0208  ? ? ?  ? ?Estimated body mass index is 18.5 kg/m? as calculated from the following: ?  Height as of this encounter: _0  (1.854 m). ?  Weight as of this encounter: 63.6 kg. ? ?Code Status: FULL CODE.  ?DVT Prophylaxis:  SCDs Start: 05/24/21 2245 ? ? ?Level of Care: Level of care: Progressive ?Family Communication family at bedside. ? ?Disposition Plan:     Remains inpatient appropriate:  IV antibiotics.  ? ?Procedures:  ?None.  ? ?Consultants:   ?Palliative care ? ?Antimicrobials:  ? ?Anti-infectives (From admission, onward)  ? ? Start     Dose/Rate Route Frequency Ordered Stop  ? 05/27/21 1000  remdesivir 100 mg in sodium chloride 0.9 % 100 mL IVPB       ?See Hyperspace for full Linked Orders Report.  ? 100 mg ?200 mL/hr over 30 Minutes Intravenous Daily 05/26/21 1229 05/31/21 0959  ? 05/26/21 1400  remdesivir 200 mg in sodium chloride 0.9% 250 mL IVPB       ?See Hyperspace for full Linked Orders Report.  ? 200 mg ?580 mL/hr over 30 Minutes Intravenous Once  05/26/21 1229    ? 05/26/21 1300  fluconazole (DIFLUCAN) IVPB 200 mg       ? 200 mg ?100 mL/hr over 60 Minutes Intravenous Every 24 hours 05/26/21 1135    ? 05/26/21 1300  Ampicillin-Sulbactam (UNASYN) 3 g in sodium chloride 0.9 % 100 mL IVPB       ? 3 g ?200 mL/hr over 30 Minutes Intravenous Every 6 hours 05/26/21 1213    ? 05/26/21 1230  remdesivir 100 mg in sodium chloride 0.9 % 100 mL IVPB  Status:  Discontinued       ? 100 mg ?200 mL/hr over 30 Minutes Intravenous Daily 05/26/21 1144 05/26/21 1229  ? 05/26/21 1000  remdesivir 100 mg in sodium chloride 0.9 % 100 mL IVPB  Status:  Discontinued       ?See Hyperspace for full Linked Orders Report.  ?  100 mg ?200 mL/hr over 30 Minutes Intravenous Daily 05/25/21 1106 05/25/21 1203  ? 05/25/21 1213  remdesivir 200 mg in sodium chloride 0.9% 250 mL IVPB  Status:  Discontinued       ?See Hyperspace for full Linked Orders Report.  ? 200 mg ?580 mL/hr over 30 Minutes Intravenous Once 05/25/21 1106 05/25/21 1203  ? 05/25/21 1000  azithromycin (ZITHROMAX) tablet 500 mg       ? 500 mg Oral Daily 05/24/21 2246    ? 05/25/21 1000  dolutegravir (TIVICAY) tablet 50 mg       ? 50 mg Oral Daily 05/24/21 2246    ? 05/25/21 1000  emtricitabine-tenofovir AF (DESCOVY) 200-25 MG per tablet 1 tablet       ? 1 tablet Oral Daily 05/24/21 2246    ? 05/25/21 1000  ethambutol (MYAMBUTOL) tablet 1,000 mg       ? 15 mg/kg ? 66.2 kg Oral Daily 05/24/21 2246    ? 05/25/21 1000  fluconazole (DIFLUCAN) tablet 200 mg  Status:  Discontinued       ? 200 mg Oral Daily 05/24/21 2246 05/26/21 1135  ? 05/25/21 1000  rifabutin (MYCOBUTIN) capsule 300 mg       ? 300 mg Oral Daily 05/24/21 2246    ? 05/25/21 1000  sulfamethoxazole-trimethoprim (BACTRIM) 400-80 MG per tablet 1 tablet       ? 1 tablet Oral Daily 05/24/21 2246    ? 05/24/21 2330  cefTRIAXone (ROCEPHIN) 2 g in sodium chloride 0.9 % 100 mL IVPB  Status:  Discontinued       ? 2 g ?200 mL/hr over 30 Minutes Intravenous Daily at bedtime 05/24/21  2246 05/26/21 1135  ? ?  ? ? ? ?Medications ? ?Scheduled Meds: ? azithromycin  500 mg Oral Daily  ? Chlorhexidine Gluconate Cloth  6 each Topical Daily  ? dexamethasone (DECADRON) injection  6 mg Intraven

## 2021-05-26 NOTE — Progress Notes (Signed)
Pharmacy Antibiotic Note ? ?Ricky [name] is a 66 y.o. male admitted on 05/24/2021 with pneumonia.  Pharmacy has been consulted for unasyn dosing per ID. Patient is also COVID positive on 05/24/21 and has been started on remdesivir and steroids per ID. ? ?Of note, patient has HIV with recent undetectable CD4 count and is immunocompromised. He has history of multiple opportunistic infections. He is on the following: ? ?-HIV: tivicay, descovy ?-H/x cryptococcal meningitis fluconazole ?-Dissemiunated mycobaterial inf: azithromycin, rifabutin, ethambutol ?-PJP ppx: bactrim SS daily ? ?WBC low at 2.9. Scr stable at 1.68, afebrile.  ? ?Plan: ?Start Unasyn 3 g q6h ?F/u cultures, clinical course, s/x of infection and ability to de-escalate ?F/u ID team recommendations ? ?Height: '6\' 1"'$  (185.4 cm) ?Weight: 63.6 kg (140 lb 3.4 oz) ?IBW/kg (Calculated) : 79.9 ? ?Temp (24hrs), Avg:94.5 ?F (34.7 ?C), Min:92.5 ?F (33.6 ?C), Max:97.8 ?F (36.6 ?C) ? ?Recent Labs  ?Lab 05/20/21 ?0508 05/21/21 ?4656 05/24/21 ?2000 05/25/21 ?8127 05/26/21 ?5170 05/26/21 ?0174  ?WBC  --  2.6* 2.5* 2.3* 3.5* 2.9*  ?CREATININE 1.09  --  1.58* 1.63* 1.70* 1.68*  ?LATICACIDVEN  --   --  1.6  --   --   --   ?  ?Estimated Creatinine Clearance: 40.5 mL/min (A) (by C-G formula based on SCr of 1.68 mg/dL (H)).   ? ?Allergies  ?Allergen Reactions  ? Bactrim [Sulfamethoxazole-Trimethoprim] Other (See Comments)  ?  Patient was trialed on DS TIW and SS daily for PJP prophylaxis and developed hyperkalemia and increased Scr (pt is currently taking bactrim per infectious disease notes 05/07/21)  ? ? ?Antimicrobials this admission: ?4/21 ceftriaxone >> 4/23 ?4/23 unasyn >> ?4/22 azithromycin >>  ? ?4/23 remdesivir>> ? ?Dose adjustments this admission: ?NA ? ?Microbiology results: ?4/21 BCx: ngtd ?4/23 UCx: ngtd  ?4/22 Sputum: set  ?4/22 MRSA PCR: neg ?4/21: COVID positive ? ?Thank you for allowing pharmacy to be a part of this patient?s care. ? ?Cathrine Muster,  PharmD ?PGY2 Cardiology Pharmacy Resident ?Phone: [phone] ?05/26/2021  12:07 PM ? ?Please check [email or web address] for unit-specific pharmacy phone numbers. ? ?Ricky [name] ?05/26/2021 12:03 PM ? ?

## 2021-05-26 NOTE — Consult Note (Addendum)
? ? ? ?Date of Admission:  05/24/2021    ?      ?Reason for Consult:  AIDS cachexia pneumonia hypoglycemia  ?Referring Provider: Hosie Poisson, MD ? ? ?Assessment: ? 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 ?Plan: ? ?Will change him from ceftriaxone to unasyn to better cover anaerobes ?We will repeat CT of the chest without  contrast ?Would recommend large-volume paracentesis for therapeutic and diagnostic purposes and send fluid for cell count differential protein glucose albumin AFB culture fungal culture and for cytology ?Will check cycle threshold on his COVID PCR ?Would send his Lymph node to the [facility] for PCR testing to help hopefully understand whether his lymph node is truly infected with Mycobacterium or whether he has another infection with nocardia present ?Hopefully he can began to take pills again when he becomes less confused and when that is the case would reinstitute his TIVICAY Truvada,  azithromycin and rifabutin and ethambutol and bactrim ?Would give him fluconazole IV until he can take po meds ?May need to have feeding tube placed for meds and nutrition if he continues to be so encephalopathic. And we MAY NEED TO DO THIS REGARDLESS IF  PURSUING AGGRESSIVE CARE TO DELIVER SUFFICIENT NUTRITION ?May be also prudent to involve palliative care  ?I will check HIV RNA quant and CD4 ?Continue airborne isolation precautions for COVID ? ?ADDENDUM: Cycle threshold for his COVID 19 was 16 c/w active COVID 19 infection ? ?Would initiate remdesivir and steroids (the latter might help with his hypoglycemia) ? ? ? ?Principal Problem: ?  Non-diabetic hypoglycemia ?Active Problems: ?  AIDS (acquired immune deficiency syndrome) ([place]) ?  AKI (acute kidney injury) ([place]) ?  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  COVID-19 ?  Community acquired pneumonia ?  Lymphadenopathy ?  Protein-calorie malnutrition, severe ?  Pancytopenia ([place]) ? ? ?Scheduled Meds: ? azithromycin  500 mg Oral Daily  ? Chlorhexidine Gluconate Cloth  6 each Topical Daily  ? dolutegravir  50 mg Oral Daily  ? emtricitabine-tenofovir AF  1 tablet Oral Daily  ? ethambutol  15 mg/kg Oral Daily  ? famotidine  20 mg Oral BID  ? feeding supplement  237 mL Oral TID BM  ? fluconazole  200 mg Oral Daily  ? megestrol  400 mg Oral Daily  ? mirtazapine  15 mg Oral QHS  ? pantoprazole  40 mg Oral Q0600  ? rifabutin  300 mg Oral Daily  ? sodium chloride flush  3 mL Intravenous Q12H  ? sulfamethoxazole-trimethoprim  1 tablet Oral Daily  ? ?Continuous Infusions: ? cefTRIAXone (ROCEPHIN)  IV 2 g (05/25/21 2253)  ? dextrose 5% lactated ringers 75 mL/hr at 05/26/21 0036  ? ?PRN Meds:.acetaminophen **OR** acetaminophen, alum & mag hydroxide-simeth, guaiFENesin-dextromethorphan, nitroGLYCERIN, senna-docusate, traMADol ? ?HPI: Ricky [name] is a 66 y.o. male with longstanding history of HIV AIDS and history of nonadherence to medical therapy who  more recently became engaged in care and actually came to appointments at the clinic having seen Dr. Gale Journey on several occasions. ? ?Unfortunately he jad already succumbed in the interim to multiple opportunistic infections including cryptococcal  meningitis which fortunately has been treated [place] with chronic fluconazole.  He also developed disseminated Mycobacterium avium which grew on blood cultures and he is currently on azithromycin ethambutol and rifabutin. ? ?Antiviral regimen is currently TIVICAY and Truvada and his most recent viral load in April had come down to nearly undetectable but unfortunately CD4 count has not budged much yet. ? ?He was recently admitted after a retroperitoneal lymph node biopsy was read as showing granulomatous inflammation with multiple filamentous organisms initially thought to be consistent with nocardia but upon review later felt to be consistent with Mycobacterium avium. ? ?During his most recent admission he was still COVID-19 positive and was treated with molnuprinavir. ? ?He was discharged to skilled nursing facility given the fact that he could no longer care for himself. ? ?He is now readmitted with hypoglycemia after found to be minimally responsive and with blood sugars in the 30s. ? ?He was taken to the ER and blood sugars have improved with IV dextrose but then dropped again.  This x-ray done on admission was read as showing a retrocardiac opacity.  He did drop his saturations into the 70s on room air. ? ?He had pneumococcal antigen Legionella antigen sent and was started on ceftriaxone. ? ?Admission labs were significant for pancytopenia and acute kidney injury. ? ?He is still positive for COVID-19 although I do not know the cycle threshold on the PCR. ? ?He had a CT of the abdomen pelvis performed showed diffuse retroperitoneal lymphadenopathy which is improved slightly likely in response to his treatment for Mycobacterium, along with bilateral lobar airspace consolidation and previously seen cavitary lesion in the left lower lobe which now is consolidated into a soft tissue "mass with a large volume of abdominal pelvic ascites calcified pancreas ? ?Today when I examined him he was not able to converse  with me at all and was clearly still encephalopathic he is apparently still making copious secretions and complaining of pain diffusely. ? ?I would expect that some of his pain is due to his extensive ascites and I would recommend large-volume paracentesis both for diagnostic and therapeutic purposes. ? ?Would make sure that peritoneal fluid is sent for cell count differential protein glucose albumin, AFB stain and culture fungal stain and culture as well as sent for cytology. ? ?I will repeat CT of the chest without contrast. ? ?I will ask the lab if they can give me the cycle threshold on his COVID-19 PCR. ? ?Currently he is not lucid enough to be able to take oral medications. ? ?If this does not improve and aggressive care is desired he will need a feeding tube placed so that he can be given his antiretrovirals which are TIVICAY and Truvada along with his medications for Mycobacterium AVM, namely azithromycin ethambutol and rifabutin, and PCP prophylaxis with Bactrim. ? ?He is on chronic fluconazole which I will switch over to IV form since he is not taking p.o. at present. ? ?Ultimately he also may benefit again from palliative care consult. ? ?I am proud of him for finally getting his virus under control but it is quite depressing seeing how many different opportunistic infections a taken hold of him and his current condition. ? ?I would think that everything he has is treatable and  curable but it is going to require him to be in skilled nursing for an extended period of time with quite aggressive measures to get him back to a healthy state. ? ?I spent 123 minutes with the patient including than 50% of the time in face to face counseling of the patient guarding the above pathology, personally reviewing CT of the abdomen pelvis performed admission chest x-ray prior CTs of the chest pathology viral load CD4 counts cultures pathology along with review of medical records in preparation for the visit and during the  visit and in coordination of his care. ? ? ? ?Review of Systems: ?Review of Systems  ?Unable to perform ROS: Mental acuity  ? ?Past Medical History:  ?Diagnosis Date  ? Human immunodeficiency virus (HIV) d

## 2021-05-26 NOTE — Progress Notes (Signed)
PT Cancellation Note ? ?Patient Details ?Name: Ricky [name] ?MRN: [id number] ?DOB: May 22, 1955 ? ? ?Cancelled Treatment:    Reason Eval/Treat Not Completed: Patient not medically ready Per RN, pt with continued hallucinations, decreased responsiveness, and not following commands. Will follow. ? ? ?Wyona Almas, PT, DPT ?Acute Rehabilitation Services ?Pager [phone] ?Office [phone] ? ? ? ?Carloine Margo Aye ?05/26/2021, 11:49 AM ?

## 2021-05-26 NOTE — Consult Note (Signed)
? ?NAME:  Ricky [name], MRN:  [id number], DOB:  11/26/1955, LOS: 2 ?ADMISSION DATE:  05/24/2021, CONSULTATION DATE:  05/26/2021 ?REFERRING MD:  Dr. Karleen Hampshire, CHIEF COMPLAINT:  Respiratory distress and hypoxia   ? 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 ? ?On ED arrival patient was seen afebrile but mildly tachypneic and hypoxic.  Head CT negative.  Lab work significant for mildly elevated LFTs, hypoalbuminemia, and mild elevation in creatinine of 1.58, pancytopenia with marked left shift. ? ?Afternoon of 4/23 patient was seen with progressive hypotension and hypothermia prompting critical care consultation. ? 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 ?Significant Hospital Events: ?Including procedures, antibiotic start and stop dates in addition to other pertinent events   ?4/21 admitted from local SNF with poor mentation and hypoglycemia ?4/23 critical care consulted due to hypothermia, tachypnea, and hypotension ? ?Interim History / Subjective:  ?As above ? ?Objective   ?Blood pressure (!) 88/65, pulse 93, temperature (!) 95.4 ?F (35.2 ?C), temperature source Rectal, resp. rate (!) 21, height  _0  (1.854 m), weight 63.6 kg, SpO2 100 %. ?   ?   ? ?Intake/Output Summary (Last 24 hours) at 05/26/2021 1633 ?Last data filed at 05/26/2021 0300 ?Gross per 24 hour  ?Intake 580.22 ml  ?Output 307 ml  ?Net 273.22 ml  ? ?Filed Weights  ? 05/24/21 2022  ?Weight: 63.6 kg  ? ? ?Examination: ?Examination deferred to attending ? ?Resolved Hospital Problem list   ? ? ?Assessment & Plan:  ?Acute metabolic encephalopathy ?History of cryptococcal meningitis ?-Being treated with fluconazole currently ?P: ?Maintain neuro protective measures ?Nutrition and bowel regiment  ?Seizure precautions  ?Aspirations precautions  ?Minimize sedation ?Continue IV fluconazole, per ID ? ?Severe sepsis with evidence of organ dysfunction ?-By afternoon of 4/23 patient is hypothermic, tachypneic, and hypotensive with evidence of AKI and elevated LFTs on admit. ?Pancytopenia ?-WBC 2.9, platelets 5.1 on 4/23 ?P: ?Continue current care on progressive, low threshold to move to ICU  ?Supplemental oxygen for sat goal > 92 ?Follow pan cultures  ?Continue Unasyn, Remdesivir, Rifabutin. And Bactrim ? ?Acute kidney injury ?-Creatine 1.68, GFR 45 on 4/23 ?P: ?Follow renal function  ?Monitor urine output ?Trend Bmet ?Avoid nephrotoxins ?Ensure adequate renal perfusion  ? ?HIV/AIDS with last CD4 count less than 35 ?-On Tivicay, Descovy, and truvada ?P: ?ID following, appreciate assistance  ?Neutropenic precautions  ?Supportive care  ?Trend CBC and fever curve  ? ?History of disseminated MAC infection ?-Managed on Azithromycin, Ethambutol, and Rifabutin at baseline  ?History of TB ?Persistent COVID-19 infection ?-With concern  for highly mutates variant given non-ex ?Left lower lobe lung mass ?P: ?Infectious disease following ?Continue antibiotics and antivirals as above ?Isolation/neutropenic precautions as above ? ?Extensive retroperitoneal and mediastinal lymphadenopathy ?Elevated liver enzymes ?Anasarca ?Severe hypoalbuminemia ?P: ?Patient would benefit  from large-volume paracentesis but given hemodynamic instability may worsen hypotension ?Likely will need albumin supplementation  during paracentesis ?Send fluid studies/cultures from paracentesis ?Oxygen supplementation as below ? ?Severe protein calorie malnutrition ?Failure to thrive ?P: ?Supplement protein as able ?Need to consider enteral nutrition ? ? ? ?Best Practice (right click and "Reselect all SmartList Selections" daily)  ? ?Diet/type: NPO ?DVT prophylaxis: SCD ?GI prophylaxis: PPI ?Lines: N/A ?Foley:  N/A ?Code Status:  full code ?Last date of multidisciplinary goals of care discussion: Family extensively updated per attending at bedside 4/23 with continued goals of aggressive intervention.  Palliative care is following patient ? ?Labs   ?CBC: ?Recent Labs  ?Lab 05/21/21 ?4166 05/24/21 ?2000 05/25/21 ?0630 05/25/21 ?1946 05/26/21 ?0056 05/26/21 ?1601  ?WBC 2.6* 2.5* 2.3*  --  3.5* 2.9*  ?NEUTROABS 1.7 1.2*  --   --  2.7  --   ?HGB 8.4* 7.5* 6.8* 8.3* 8.0* 7.6*  ?HCT 25.6* 23.3* 21.0* 26.1* 25.0* 23.3*  ?MCV 100.8* 102.6* 101.4*  --  93.3 92.8  ?PLT 144* 106* 92*  --  67* 51*  ? ? ?Basic Metabolic Panel: ?Recent Labs  ?Lab 05/20/21 ?0508 05/21/21 ?0932 05/24/21 ?2000 05/25/21 ?3557 05/26/21 ?3220 05/26/21 ?2542  ?NA 135  --  134* 132* 134* 133*  ?K 4.3  --  4.6 4.3 4.3 4.5  ?CL 113*  --  111 108 111 109  ?CO2 19*  --  20* 19* 17* 18*  ?GLUCOSE 71  --  176* 134* 90 103*  ?BUN 15  --  24* 24* 26* 25*  ?CREATININE 1.09  --  1.58* 1.63* 1.70* 1.68*  ?CALCIUM 8.1*  --  8.6* 8.4* 8.6* 8.4*  ?MG  --  1.8  --   --  1.8  --   ?PHOS  --  3.5  --   --   --   --   ? ?GFR: ?Estimated Creatinine Clearance: 40.5 mL/min (A) (by C-G formula based on SCr of 1.68 mg/dL (H)). ?Recent Labs  ?Lab 05/24/21 ?2000 05/25/21 ?7062 05/25/21 ?1858 05/26/21 ?0056 05/26/21 ?3762  ?PROCALCITON  --   --  4.44  --   --   ?WBC 2.5* 2.3*  --  3.5* 2.9*  ?LATICACIDVEN 1.6  --   --   --   --   ? ? ?Liver Function Tests: ?Recent Labs  ?Lab  05/24/21 ?2000 05/25/21 ?8315 05/26/21 ?1761 05/26/21 ?6073  ?AST 91* 90* 88* 86*  ?ALT 52* 50* 53* 49*  ?ALKPHOS 96 92 88 88  ?BILITOT 1.6* 1.6* 1.8* 1.4*  ?PROT 5.7* 5.6* 5.7* 5.4*  ?ALBUMIN RESULTS UNAVAILABLE DUE TO INTERFERING SUBSTANCE <1.5* <1.5* <1.5*  ? ?No results for input(s): LIPASE, AMYLASE in the last 168 hours. ?No results for input(s): AMMONIA in the last 168 hours. ? ?ABG ?   ?Component Value Date/Time  ? PHART 7.4 05/26/2021 0235  ? PCO2ART 29 (L) 05/26/2021 0235  ? PO2ART 166 (H) 05/26/2021 0235  ? HCO3 18.0 (L) 05/26/2021 0235  ? ACIDBASEDEF 5.5 (H) 05/26/2021 0235  ? O2SAT 98.9 05/26/2021 0235  ?  ? ?Coagulation Profile: ?No results for input(s): INR, PROTIME in the last 168 hours. ? ?Cardiac Enzymes: ?No results for input(s): CKTOTAL, CKMB, CKMBINDEX, TROPONINI in the last 168 hours. ? ?HbA1C: ?Hgb A1c MFr Bld  ?Date/Time Value Ref Range Status  ?05/25/2021 05:40 AM 3.9 (L) 4.8 - 5.6 % Final  ?  Comment:  ?  (NOTE) ?Pre diabetes:          5.7%-6.4% ? ?Diabetes:              >  6.4% ? ?Glycemic control for   <7.0% ?adults with diabetes ?  ? ? ?CBG: ?Recent Labs  ?Lab 05/26/21 ?0234 05/26/21 ?3374 05/26/21 ?4514 05/26/21 ?0911 05/26/21 ?1451  ?GLUCAP 100* 104* 97 99 70  ? ? ?Review of Systems:   ?Unable to assess due to encephalopathy ? ?Past Medical History:  ?He,  has a past medical history of Human immunodeficiency virus (HIV) disease ([place]) (03/26/2020).  ? ?Surgical History:  ? ?Past Surgical History:  ?Procedure Laterality Date  ? IR FL GUIDED LOC OF NEEDLE/CATH TIP FOR SPINAL INJECTION LT  10/17/2020  ? SVT ABLATION N/A 04/23/2020  ? Procedure: SVT ABLATION;  Surgeon: Evans Lance, MD;  Location: [place] [facility];  Service: Cardiovascular;  Laterality: N/A;  ?  ? ?Social History:  ? reports that he has quit smoking. His smoking use included cigarettes. He has quit using smokeless tobacco. He reports current alcohol use of about 14.0 - 21.0 standard drinks per week. He reports that he does  not currently use drugs.  ? ?Family History:  ?His family history includes Hypertension in his brother, mother, and sister; Kidney cancer in his brother; Kidney disease in his mother. There is no history

## 2021-05-26 NOTE — Evaluation (Signed)
Clinical/Bedside Swallow Evaluation ?Patient Details  ?Name: Ricky [name] ?MRN: [id number] ?Date of Birth: 28-Apr-1955 ? ?Today's Date: 05/26/2021 ?Time: SLP Start Time (ACUTE ONLY): 1660 SLP Stop Time (ACUTE ONLY): 1546 ?SLP Time Calculation (min) (ACUTE ONLY): 16 min ? ?Past Medical History:  ?Past Medical History:  ?Diagnosis Date  ? Human immunodeficiency virus (HIV) disease ([place]) 03/26/2020  ? ?Past Surgical History:  ?Past Surgical History:  ?Procedure Laterality Date  ? IR FL GUIDED LOC OF NEEDLE/CATH TIP FOR SPINAL INJECTION LT  10/17/2020  ? SVT ABLATION N/A 04/23/2020  ? Procedure: SVT ABLATION;  Surgeon: Evans Lance, MD;  Location: [facility];  Service: Cardiovascular;  Laterality: N/A;  ? ?HPI:  ?Pt is a 66 y.o. male who presented from SNF where he was noted to be poorly responsive and found to have CBG in the 30s. Acute metabolic encephalopathy suspected. CXR: 4/23: Left retrocardiac opacities may indicate atelectasis or developing consolidation. PMH: HIV/AIDS with recent CD4 <35, disseminated MAC, history of cryptococcal meningitis, chronic COVID, dysphagia, debility, severe malnutrition, and abnormal lymphadenopathy, GERD. Pt seen by SLP 4/10 due to "gagging episode while taking pills"; BSE WNL, Rx: regular diet with thin liquids, pills whole in puree. SLP reconsulted 4/14 due to pt grimacing while taking pills; delayed cough noted with 1/7 sips of water; facial grimacing noted but pt denied odynophagia.  ?  ?Assessment / Plan / Recommendation  ?Clinical Impression ? Pt was seen for bedside swallow evaluation with two sisters present. Pt's sister reported that the pt has not eaten for the past few days. Pt was alert, but encouragement was necessary for participation, and he was unable to provide any meaningful history. Pt's swallow function is notably worse than when he was last seen by SLP on 4/14. Pt demonstrated reduced bolus awareness with absent bolus manipulation, and oral holding.  Despite prompts and cues from pt's family and SLP, no volitional swallow was noted with puree, and pt only demonstrated three swallows with ice chips throughout the evaluation. Pt does not present as a candidate for safe oral intake at this time. An NPO status is recommended and pt's attending, Dr. Karleen Hampshire, has been advised of this. SLP will follow to assess improvement in swallow function if this continues to align with GOC. ?SLP Visit Diagnosis: Dysphagia, unspecified (R13.10) ?   ?Aspiration Risk ? Moderate aspiration risk;Risk for inadequate nutrition/hydration  ?  ?Diet Recommendation NPO  ? ?Medication Administration: Via alternative means  ?  ?Other  Recommendations Oral Care Recommendations: Oral care QID   ? ?Recommendations for follow up therapy are one component of a multi-disciplinary discharge planning process, led by the attending physician.  Recommendations may be updated based on patient status, additional functional criteria and insurance authorization. ? ?Follow up Recommendations  (TBD)  ? ? ?  ?Assistance Recommended at Discharge Frequent or constant Supervision/Assistance  ?Functional Status Assessment Patient has had a recent decline in their functional status and demonstrates the ability to make significant improvements in function in a reasonable and predictable amount of time.  ?Frequency and Duration min 2x/week  ?2 weeks ?  ?   ? ?Prognosis Prognosis for Safe Diet Advancement: Fair ?Barriers to Reach Goals: Severity of deficits  ? ?  ? ?Swallow Study   ?General Date of Onset: 05/26/21 ?HPI: Pt is a 66 y.o. male who presented from SNF where he was noted to be poorly responsive and found to have CBG in the 30s. Acute metabolic encephalopathy suspected. CXR: 4/23: Left retrocardiac  opacities may indicate atelectasis or developing consolidation. PMH: HIV/AIDS with recent CD4 <35, disseminated MAC, history of cryptococcal meningitis, chronic COVID, dysphagia, debility, severe malnutrition, and  abnormal lymphadenopathy, GERD. Pt seen by SLP 4/10 due to "gagging episode while taking pills"; BSE WNL, Rx: regular diet with thin liquids, pills whole in puree. SLP reconsulted 4/14 due to pt grimacing while taking pills; delayed cough noted with 1/7 sips of water; facial grimacing noted but pt denied odynophagia. ?Type of Study: Bedside Swallow Evaluation ?Previous Swallow Assessment: See HPI ?Diet Prior to this Study: Thin liquids;Dysphagia 3 (soft) ?Temperature Spikes Noted: No ?Respiratory Status: Nasal cannula ?History of Recent Intubation: No ?Behavior/Cognition: Alert;Doesn't follow directions;Requires cueing;Distractible ?Oral Cavity Assessment: Within Functional Limits ?Oral Care Completed by SLP: No ?Oral Cavity - Dentition: Missing dentition;Poor condition ?Self-Feeding Abilities: Total assist ?Patient Positioning: Partially reclined;Postural control adequate for testing ?Baseline Vocal Quality: Normal ?Volitional Cough: Cognitively unable to elicit ?Volitional Swallow: Unable to elicit  ?  ?Oral/Motor/Sensory Function Overall Oral Motor/Sensory Function:  (UTA)   ?Ice Chips Ice chips: Impaired ?Presentation: Spoon ?Oral Phase Impairments: Poor awareness of bolus ?Oral Phase Functional Implications: Oral holding;Prolonged oral transit   ?Thin Liquid Thin Liquid: Not tested  ?  ?Nectar Thick Nectar Thick Liquid: Not tested   ?Honey Thick Honey Thick Liquid: Not tested   ?Puree Puree: Impaired ?Presentation: Spoon ?Oral Phase Impairments: Poor awareness of bolus ?Oral Phase Functional Implications: Oral holding;Oral residue;Prolonged oral transit   ?Solid ? ? ?  Solid: Not tested  ? ?  ?Ricky [name] I. Hardin Negus, [place], CCC-SLP ?Acute Rehabilitation Services ?Office number [phone] ?Pager [phone] ? ?Ricky [name] ?05/26/2021,4:08 PM ? ? ? ? ? ?

## 2021-05-26 NOTE — Progress Notes (Signed)
SLP Cancellation Note ? ?Patient Details ?Name: Ricky [name] ?MRN: [id number] ?DOB: November 21, 1955 ? ? ?Cancelled treatment:       Reason Eval/Treat Not Completed: Patient at procedure or test/unavailable (SLP arrived to the unitto see pt, but Sharyn Lull, RN reported that the pt is currently having a [place] discussion with palliative care. SLP will follow up later as schedule allows.) ? ?Carie Kapuscinski I. Hardin Negus, [place], CCC-SLP ?Acute Rehabilitation Services ?Office number [phone] ?Pager [phone] ? ?Horton Marshall ?05/26/2021, 2:42 PM ?

## 2021-05-26 NOTE — Progress Notes (Addendum)
? ?                                                                                                                                                     ?                                                   ?Daily Progress Note  ? ?Patient Name: Ricky [name]       Date: 05/26/2021 ?DOB: 1955/08/02  Age: 66 y.o. MRN#: [id number] ?Attending Physician: Hosie Poisson, MD ?Primary Care Physician: Pcp, No ?Admit Date: 05/24/2021 ? ?Reason for Consultation/Follow-up: Establishing goals of care ? ?Subjective: ?Medical records reviewed. Patient assessed at the bedside. Discussed with RN. His sister Ricky [name] is present for the majority of the discussion. His brother Ricky [name], sister Ricky [name], and several cousins and other extended family then arrive and I provided an update on the severity of patient's illness in the waiting room. I shared the importance of honoring patient's wishes if he were able to speak for himself and recommended consideration of DNR order and comfort focused care. ? ?Reviewed patient's illness and interval history since our initial discussion at length including complications such as worsening AKI, hypothermia, progressive hypotension, ongoing hypoglycemia. Ricky [name] understands that "things are bad, everything keeps coming down" but also seems to be unrealistic in her desire to discuss topics such as guardianship, HCPOA documentation, and resources for home. I shared my worry that patient will not improve enough to go home and that he is at high risk to die from his current illness. I again recommended consideration of DNR order and provided education that this decision does not impact care patient receives, rather is only effective if he stops breathing or his heart stops despite all aggressive interventions aimed at prolonging life. Patient's sister Ricky [name] shares that she does not "believe" in DNR or discontinuation of life-prolonging interventions as this would be giving up. Provided reassurance and education  that patient would continue to receive excellent care even if the focus of his care were to shift to a comfort oriented approach. She states "all we can do is keep him comfortable" and I reviewed comfort care in detail. She clarifies that her idea of comfort is provided positive reinforcement, companionship and support as he continues to receive all possible interventions to prolong his life. She wants patient to receive pain medication and we discussed risks and benefits of opioids given patient's tenuous condition and overall goals of care. She would like patient to receive tramadol if safe, however her priority is quantity of life over quality. She tells me patient's brother Ricky [name] has accepted that patient is not likely to survive but she is  not willing to consider this possibility.  ? ?Questions and concerns addressed. [employer] Choices for [employer] booklet provided for review. PMT will continue to support holistically.  ? ?Length of Stay: 2 ? ?Current Medications: ?Scheduled Meds:  ? azithromycin  500 mg Oral Daily  ? Chlorhexidine Gluconate Cloth  6 each Topical Daily  ? dexamethasone (DECADRON) injection  6 mg Intravenous Daily  ? dolutegravir  50 mg Oral Daily  ? emtricitabine-tenofovir AF  1 tablet Oral Daily  ? ethambutol  15 mg/kg Oral Daily  ? famotidine  20 mg Oral BID  ? feeding supplement  237 mL Oral TID BM  ? megestrol  400 mg Oral Daily  ? mirtazapine  15 mg Oral QHS  ? pantoprazole  40 mg Oral Q0600  ? rifabutin  300 mg Oral Daily  ? sodium chloride flush  3 mL Intravenous Q12H  ? sulfamethoxazole-trimethoprim  1 tablet Oral Daily  ? ? ?Continuous Infusions: ? ampicillin-sulbactam (UNASYN) IV    ? dextrose 5% lactated ringers 75 mL/hr at 05/26/21 0036  ? fluconazole (DIFLUCAN) IV 200 mg (05/26/21 1359)  ? remdesivir 200 mg in sodium chloride 0.9% 250 mL IVPB    ? Followed by  ? [START ON 05/27/2021] remdesivir 100 mg in NS 100 mL    ? ? ?PRN Meds: ?acetaminophen **OR** acetaminophen, alum & mag  hydroxide-simeth, guaiFENesin-dextromethorphan, nitroGLYCERIN, senna-docusate, traMADol ? ?Physical Exam ?Vitals and nursing note reviewed.  ?Constitutional:   ?   Appearance: He is cachectic. He is ill-appearing.  ?   Interventions: Nasal cannula in place.  ?   Comments: hypothermic  ?Cardiovascular:  ?   Rate and Rhythm: Normal rate.  ?Pulmonary:  ?   Effort: Tachypnea present.  ?Skin: ?   General: Skin is warm and dry.  ?Neurological:  ?   Mental Status: He is lethargic.  ?         ? ?Vital Signs: BP 107/76 (BP Location: Left Arm)   Pulse 96   Temp (!) 95.4 ?F (35.2 ?C) (Rectal)   Resp (!) 25   Ht _0  (1.854 m)   Wt 63.6 kg   SpO2 100%   BMI 18.50 kg/m?  ?SpO2: SpO2: 100 % ?O2 Device: O2 Device: Nasal Cannula ?O2 Flow Rate: O2 Flow Rate (L/min): 2 L/min ? ?Intake/output summary:  ?Intake/Output Summary (Last 24 hours) at 05/26/2021 1509 ?Last data filed at 05/26/2021 0300 ?Gross per 24 hour  ?Intake 580.22 ml  ?Output 307 ml  ?Net 273.22 ml  ? ?LBM: Last BM Date : 05/26/21 ?Baseline Weight: Weight: 63.6 kg ?Most recent weight: Weight: 63.6 kg ? ?     ?Palliative Assessment/Data: 10% ? ? ? ? ? ?Patient Active Problem List  ? Diagnosis Date Noted  ? Goals of care, counseling/discussion   ? Non-diabetic hypoglycemia 05/24/2021  ? Pancytopenia ([place]) 05/24/2021  ? Thrombocytopenia ([place]) 05/15/2021  ? Hyponatremia 05/10/2021  ? GERD without esophagitis 05/10/2021  ? Protein-calorie malnutrition, severe 05/10/2021  ? 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 Bladder wall thickening 04/08/2021  ? Abdominal pain 04/08/2021  ? Normocytic anemia 04/08/2021  ? Nausea with vomiting 04/08/2021  ? Fever   ? Acute cholecystitis 01/22/2021  ? Complicated UTI (urinary tract infection) 01/22/2021  ? Noncompliance 01/22/2021  ? Lymphadenopathy   ? Chronic low back pain   ? HIV infection ([place])   ? Community acquired pneumonia   ? Nonadherence to medical treatment   ? COVID-19 virus infection 10/13/2020  ? Alcohol abuse, daily use   ?  COVID-19 10/12/2020  ? 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 Blurry vision, left eye   ? AKI (acute kidney injury) ([place])   ? Cavitary lesion of lung   ? Acute nonintractable headache   ? Smoking   ? Cocaine use   ? Paroxysmal SVT (supraventricular tachycardia) ([facility])   ? Abnormal EKG   ? Atrial flutter ([place]) 03/27/2020  ? Aortic atherosclerosis ([place]) 03/27/2020  ? Hypertension 03/27/2020  ? 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 Leukocytopenia 03/26/2020  ? Macrocytosis 03/26/2020  ? Moderate protein malnutrition ([place]) 03/26/2020  ? Hypoalbuminemia 03/26/2020  ? AIDS (acquired immune deficiency syndrome) ([place]) 03/26/2020  ? ? ?Palliative Care Assessment & Plan  ? ?Patient Profile: ?66 y.o. male  with past medical history of  HIV/AIDS with recent CD4 <35, disseminated MAC, history of cryptococcal meningitis, chronic COVID, dysphagia, debility, severe malnutrition, and abnormal lymphadenopathy admitted on 05/24/2021 with hypoglycemia and poor responsiveness at SNF. ?  ?Patient was recently discharged to SNF on 05/21/21 and is very cachectic, frail, now with AKI and acute encephalopathy. PMT has been consulted to assist with goals of care conversation.  ? ?Assessment: ?HIV/AIDS with CD4 <35 ?Disseminated MAC ?Chronic COVID ?Severe malnutrition ?Debility ?Acute respiratory failure with hypoxia ?Sepsis ?AKI ?Dysphagia, worsening ? ? ?Recommendations/Plan: ?Continue full code/full scope treatment ?Psychosocial and emotional support provided ?PMT will continue to follow and offer family meeting for ongoing [place] discussions, goals are clear for aggressive care at this time ? ? ?Prognosis: ? Very poor prognosis given severe cachexia, malnutrition, multiple infections, failure to thrive and ongoing decline with recurrent hospitalizations ? ?Discharge Planning: ?To Be Determined ? ? ?Total time: ?I spent 65 minutes in the care of the patient today in the above activities and documenting the  encounter. ?Prolonged time: Yes ? ?Dorthy Cooler, PA-C ?Palliative Medicine Team ?Team phone # [phone] ? ?Thank you for allowing the Palliative Medicine Team to assist in the care of this patient. Pleas

## 2021-05-26 NOTE — Progress Notes (Signed)
?   05/26/21 0001  ?Assess: MEWS Score  ?Temp (!) 92.7 ?F (33.7 ?C)  ?BP (!) 71/46  ?Pulse Rate (!) 101  ?Resp 17  ?Level of Consciousness Alert  ?SpO2 98 %  ?O2 Device Room Air  ?Patient Activity (if Appropriate) In bed  ?Assess: MEWS Score  ?MEWS Temp 2  ?MEWS Systolic 2  ?MEWS Pulse 1  ?MEWS RR 0  ?MEWS LOC 0  ?MEWS Score 5  ?MEWS Score Color Red  ?Assess: if the MEWS score is Yellow or Red  ?Were vital signs taken at a resting state? Yes  ?Focused Assessment Change from prior assessment (see assessment flowsheet)  ?Early Detection of Sepsis Score *See Row Information* High  ?MEWS guidelines implemented *See Row Information* Yes  ?Treat  ?MEWS Interventions Escalated (See documentation below)  ?Pain Scale 0-10  ?Pain Score  ?(unable to give actual number - c/o chest pain)  ?Pain Frequency Constant  ?Pain Onset On-going  ?Pain Intervention(s) MD notified (Comment);Emotional support  ?Multiple Pain Sites No  ?Take Vital Signs  ?Increase Vital Sign Frequency  Red: Q 1hr X 4 then Q 4hr X 4, if remains red, continue Q 4hrs  ?Escalate  ?MEWS: Escalate Red: discuss with charge nurse/RN and provider, consider discussing with RRT  ?Notify: Charge Nurse/RN  ?Name of Charge Nurse/RN Notified Charito B. RN  ?Date Charge Nurse/RN Notified 05/26/21  ?Notify: Provider  ?Provider Name/Title Dr. Velia Meyer  ?Date Provider Notified 05/26/21  ?Notification Type Page  ?Notification Reason Change in status  ?Provider response See new orders  ?Date of Provider Response 05/26/21  ?Notify: Rapid Response  ?Name of Rapid Response RN Notified Anselm Pancoast RN  ?Date Rapid Response Notified 05/26/21  ?Document  ?Patient Outcome Transferred/level of care increased  ?Progress note created (see row info) Yes  ? ?Patient's BS at 2043 - 62.  1/2 amp D50 given. BS @ 2200 - 136.  BS @ 2339 - 93.  Patient is uncooperative and pulling at lines.  He refused all PO Medications.  Dr. Velia Meyer made aware.  New orders received.  As night progressed, his VS  @ 0001 = Rectal Temp - 92.7 (checked several times), BP was 71/46.  O2 Sat dropped to 90 on RA at one point.  He became tachypneic with retractions.  RR RN at bedside.  Dr. Velia Meyer made aware and new order to transfer to PCU received.  Additional orders implemented.  Patient transferred to 4E without difficulty.  Patient was in RED MEWS and protocol was implemented.  Earleen Reaper RN ?

## 2021-05-26 NOTE — Progress Notes (Signed)
TRH night cross cover note: ? ?Patient with diminished responsiveness, confusion.  Vital signs at the time notable for hypothermia, with rectal temp of 92.7; initial blood pressure per automatic cuff found to be 71/46, however correlating manual blood pressure noted to be 103/77, with ensuing systolic blood pressures in the 90s, consistent with baseline systolic blood pressures over the last day, which been noted to be in the 90s to low 100s mmHg respiratory rate 17-20, oxygen saturation 85 to 89% on room air, subsequently improving to 100% on 2 L nasal cannula and representing a new supplemental oxygen requirement for him. ? ?Leading up to the above, patient's blood sugar was noted to be 62, prompting administration of half amp of D50, with repeat blood sugar noted to be 136, and most recent value found to be trending back down to 93.  The patient had complained of some nonspecific chest discomfort earlier in the evening, which his sister conveyed is consistent with discomfort associated with GERD.  The patient refused existing orders for Pepcid as well as Protonix, which was ordered for his history of GERD. ? ?Per my chart review, including review of most recent rounding hospitalist progress note, this is a 66 year old male with history of HIV/AIDS chronic COVID-19 infection, admitted on 05/25/2018 with suspected acute metabolic encephalopathy in the setting of diminished responsiveness noted at his SNF, felt to be multifactorial in nature, with suspected contributions from hypoglycemia as well as bacterial pneumonia in the setting of presenting chest x-ray showing evidence of left retrocardiac airspace opacity concerning for pneumonia, for which azithromycin and Rocephin were initiated.  Of note, he is also on Bactrim for PCP prophylaxis. ? ?Per chart review, the patient initially demonstrated evidence of refractory hypoglycemia, prompting initiation of D10 drip, which was subsequently discontinued on 05/25/2021.   His hospitalization has also been notable for acute kidney injury, with creatinine up to 1.63 relative to baseline of 1.0, in the setting of suspected dehydration due to diminished oral intake, with CT abdomen/pelvis showing no evidence of postrenal obstructive source, including no evidence of ureteral stone or hydronephrosis.  Additionally, CT head performed on the day of admission showed no evidence of acute intracranial process. ? ?In the setting of this evening's episode of diminished responsiveness with increased supplemental oxygen demand, EKG was performed, and while results of diet been released, reportedly consistent with sinus rhythm without evidence of acute ischemic changes.  Additionally, stat chest x-ray showed previously noted left retrocardiac airspace opacity, felt to be consistent with pneumonia, while otherwise showing no evidence of acute cardiopulmonary process, including no evidence of edema, effusion, or pneumothorax. ? ?Stat CMP notable for serum bicarbonate 17, anion gap 6, creatinine 1.70, compared to most recent prior value of 1.63, with liver enzymes not suggestive of cholestatic process.  Stat CBC notable for hemoglobin 8 compared to most recent prior value of 8.3.  Additionally, stat ABG showed no evidence of hypercapnia, with specific results as follows: 7.4/29//166/18.  ? ?In the setting of evidence of refractory hypoglycemia, as well as documentation of suspected dehydration due to diminished oral intake, with updated labs reflecting further worsening of patient's renal function, I have started IV fluids in the form of D5 LR at 75 cc/h and ordered urinalysis with microscopy.  Additionally, I have ordered every hour Accu-Cheks x6 occurrences followed by resumption of previously ordered every 4 hour CBG checks, and confirmed that the patient is not on any short acting or basal insulin at this time. ? ?He is being transferred  to PCU for higher level of care.  Additionally, in the  setting of his hypothermia, I ordered [employer].  ? ? ? ?Babs Bertin, DO ?Hospitalist ? ?

## 2021-05-26 NOTE — Significant Event (Signed)
Rapid Response Event Note  ? ?Reason for Call :  ?Chest pain, T-92(R), multiple hypoglycemic events ? ?Initial Focused Assessment:  ?Pt lying in bed with eyes open. He is alert to self, c/o mid sternal chest pain. He is tachypneic and mildly labored with some accessory muscle use. Lungs diminished t/o. Skin warm and dry. ? ?T-92(R), HR-80, BP-103/77, RR-26, SpO2-90% on RA.  ? ?Interventions:  ?EKG-NSR ?PCXR-Left retrocardiac opacities may indicate atelectasis or developing consolidation. ?ABG ?Bipap ?R1RX@ 75 ?CMP, Mg ?CBCD ?B[place]hugger ?Tx to PCU for closer monitoring ?Plan of Care:  ?Tx to 4E14. Bairhugger for hypothermia. Bipap for WOB. Await lab values/ABG. Continue to monitor pt closely. Call RRT if further assistance needed.  ? ?Event Summary:  ? ?MD Notified: Dr. Velia Meyer notified by bedside RN ?Call [facility] ?Arrival Time:0010 ?End Time:0145 ? ?Dillard Essex, RN ?

## 2021-05-27 ENCOUNTER — Other Ambulatory Visit (HOSPITAL_COMMUNITY): Payer: Self-pay

## 2021-05-27 ENCOUNTER — Inpatient Hospital Stay (HOSPITAL_COMMUNITY): Payer: Medicaid Other

## 2021-05-27 ENCOUNTER — Telehealth: Payer: Self-pay

## 2021-05-27 DIAGNOSIS — Z515 Encounter for palliative care: Secondary | ICD-10-CM

## 2021-05-27 DIAGNOSIS — B20 Human immunodeficiency virus [HIV] disease: Secondary | ICD-10-CM | POA: Diagnosis not present

## 2021-05-27 DIAGNOSIS — E162 Hypoglycemia, unspecified: Secondary | ICD-10-CM | POA: Diagnosis not present

## 2021-05-27 DIAGNOSIS — N179 Acute kidney failure, unspecified: Secondary | ICD-10-CM | POA: Diagnosis not present

## 2021-05-27 DIAGNOSIS — Z7189 Other specified counseling: Secondary | ICD-10-CM

## 2021-05-27 DIAGNOSIS — Z711 Person with feared health complaint in whom no diagnosis is made: Secondary | ICD-10-CM

## 2021-05-27 DIAGNOSIS — J189 Pneumonia, unspecified organism: Secondary | ICD-10-CM | POA: Diagnosis not present

## 2021-05-27 LAB — CBC
HCT: 25.4 % — ABNORMAL LOW (ref 39.0–52.0)
Hemoglobin: 8.3 g/dL — ABNORMAL LOW (ref 13.0–17.0)
MCH: 30 pg (ref 26.0–34.0)
MCHC: 32.7 g/dL (ref 30.0–36.0)
MCV: 91.7 fL (ref 80.0–100.0)
Platelets: 63 10*3/uL — ABNORMAL LOW (ref 150–400)
RBC: 2.77 MIL/uL — ABNORMAL LOW (ref 4.22–5.81)
RDW: 21.9 % — ABNORMAL HIGH (ref 11.5–15.5)
WBC: 3.5 10*3/uL — ABNORMAL LOW (ref 4.0–10.5)
nRBC: 0 % (ref 0.0–0.2)

## 2021-05-27 LAB — COMPREHENSIVE METABOLIC PANEL
ALT: 51 U/L — ABNORMAL HIGH (ref 0–44)
AST: 73 U/L — ABNORMAL HIGH (ref 15–41)
Albumin: <1.5 g/dL — ABNORMAL LOW (ref 3.5–5.0)
Alkaline Phosphatase: 100 U/L (ref 38–126)
Anion gap: 4 — ABNORMAL LOW (ref 5–15)
BUN: 22 mg/dL (ref 8–23)
CO2: 19 mmol/L — ABNORMAL LOW (ref 22–32)
Calcium: 8.2 mg/dL — ABNORMAL LOW (ref 8.9–10.3)
Chloride: 111 mmol/L (ref 98–111)
Creatinine, Ser: 1.59 mg/dL — ABNORMAL HIGH (ref 0.61–1.24)
GFR, Estimated: 48 mL/min — ABNORMAL LOW (ref >60)
Glucose, Bld: 126 mg/dL — ABNORMAL HIGH (ref 70–99)
Potassium: 4.3 mmol/L (ref 3.5–5.1)
Sodium: 134 mmol/L — ABNORMAL LOW (ref 135–145)
Total Bilirubin: 1.2 mg/dL (ref 0.3–1.2)
Total Protein: 5.5 g/dL — ABNORMAL LOW (ref 6.5–8.1)

## 2021-05-27 LAB — HIV-1 RNA QUANT-NO REFLEX-BLD
HIV 1 RNA Quant: <20 copies/mL
LOG10 HIV-1 RNA: UNDETERMINED log10copy/mL

## 2021-05-27 LAB — GLUCOSE, CAPILLARY
Glucose-Capillary: 106 mg/dL — ABNORMAL HIGH (ref 70–99)
Glucose-Capillary: 113 mg/dL — ABNORMAL HIGH (ref 70–99)
Glucose-Capillary: 116 mg/dL — ABNORMAL HIGH (ref 70–99)
Glucose-Capillary: 120 mg/dL — ABNORMAL HIGH (ref 70–99)
Glucose-Capillary: 128 mg/dL — ABNORMAL HIGH (ref 70–99)
Glucose-Capillary: 194 mg/dL — ABNORMAL HIGH (ref 70–99)
Glucose-Capillary: 198 mg/dL — ABNORMAL HIGH (ref 70–99)

## 2021-05-27 LAB — PATHOLOGIST SMEAR REVIEW

## 2021-05-27 LAB — MAGNESIUM: Magnesium: 1.8 mg/dL (ref 1.7–2.4)

## 2021-05-27 LAB — PHOSPHORUS: Phosphorus: 4.5 mg/dL (ref 2.5–4.6)

## 2021-05-27 LAB — URINE CULTURE: Culture: >=100000 — AB

## 2021-05-27 LAB — T3, FREE: T3, Free: 1.2 pg/mL — ABNORMAL LOW (ref 2.0–4.4)

## 2021-05-27 IMAGING — DX DG ABD PORTABLE 1V
1 series · 1 of 1 positions shown · non-contrast
Comparison: [DATE]

CLINICAL DATA: Feeding tube placement

EXAM:
PORTABLE ABDOMEN - 1 VIEW

[abdomen]
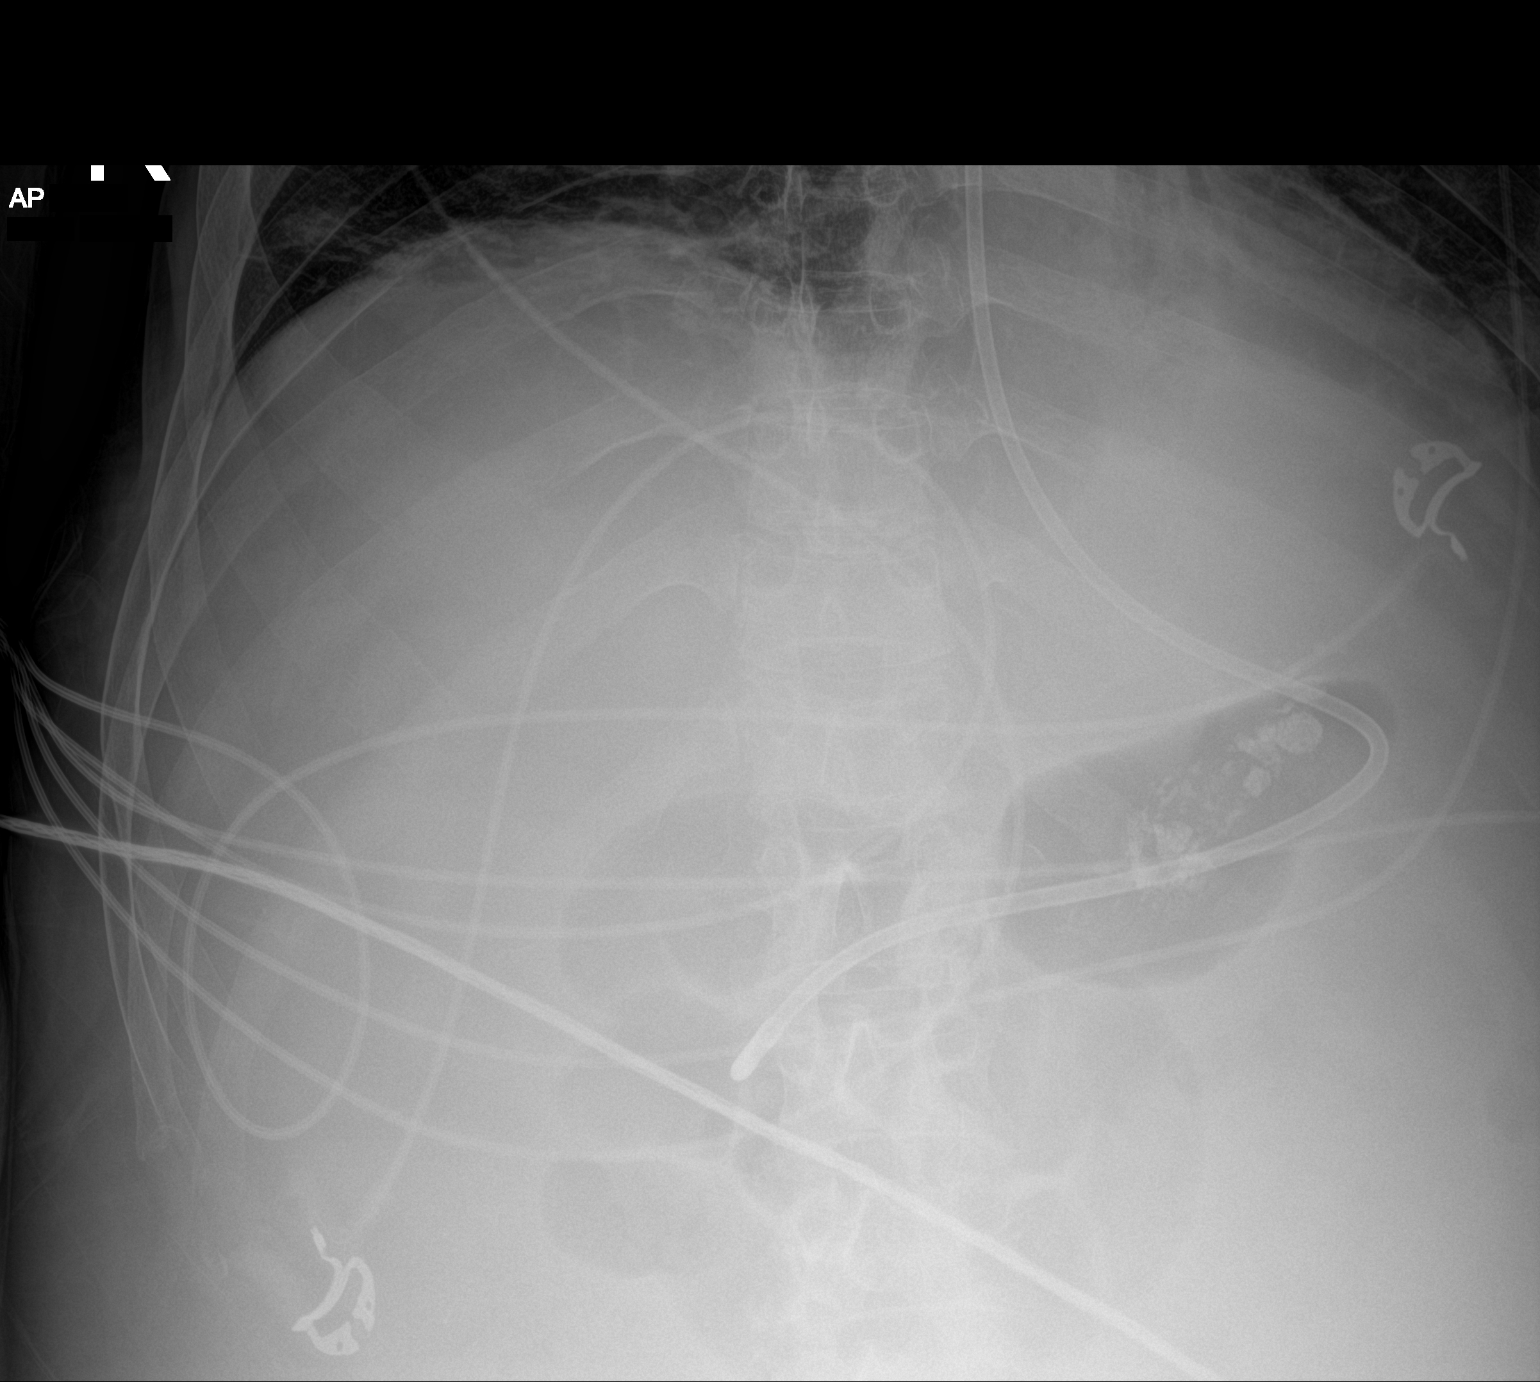

[1 of 1 positions shown; findings below may reference images not displayed]

FINDINGS: Weighted enteric feeding tube is positioned with tip below the
diaphragm, in the vicinity of the pylorus or duodenal bulb.
Nonobstructive pattern of included bowel gas.
IMPRESSION: Weighted enteric feeding tube is positioned with tip below the
diaphragm, in the vicinity of the pylorus or duodenal bulb.

## 2021-05-27 MED ORDER — FAMOTIDINE 20 MG PO TABS
20.0000 mg | ORAL_TABLET | Freq: Two times a day (BID) | ORAL | Status: DC
  Administered 2021-05-27 – 2021-05-31 (×9): 20 mg
  Filled 2021-05-27 (×9): qty 1

## 2021-05-27 MED ORDER — ENSURE ENLIVE PO LIQD
237.0000 mL | Freq: Three times a day (TID) | ORAL | Status: DC
  Administered 2021-05-27: 237 mL

## 2021-05-27 MED ORDER — DOLUTEGRAVIR SODIUM 50 MG PO TABS
50.0000 mg | ORAL_TABLET | Freq: Every day | ORAL | Status: DC
  Administered 2021-05-27 – 2021-05-31 (×5): 50 mg
  Filled 2021-05-27 (×5): qty 1

## 2021-05-27 MED ORDER — TRAMADOL HCL 50 MG PO TABS
50.0000 mg | ORAL_TABLET | Freq: Two times a day (BID) | ORAL | Status: DC | PRN
Start: 2021-05-27 — End: 2021-06-01
  Administered 2021-05-30: 50 mg
  Filled 2021-05-27: qty 1

## 2021-05-27 MED ORDER — SULFAMETHOXAZOLE-TRIMETHOPRIM 400-80 MG PO TABS
1.0000 | ORAL_TABLET | Freq: Every day | ORAL | Status: DC
  Administered 2021-05-27 – 2021-05-31 (×5): 1
  Filled 2021-05-27 (×5): qty 1

## 2021-05-27 MED ORDER — OSMOLITE 1.5 CAL PO LIQD
1000.0000 mL | ORAL | Status: DC
  Administered 2021-05-27: 1000 mL
  Filled 2021-05-27 (×2): qty 1000

## 2021-05-27 MED ORDER — AZITHROMYCIN 500 MG PO TABS
500.0000 mg | ORAL_TABLET | Freq: Every day | ORAL | Status: DC
  Administered 2021-05-27 – 2021-05-31 (×5): 500 mg
  Filled 2021-05-27 (×5): qty 1

## 2021-05-27 MED ORDER — SENNOSIDES-DOCUSATE SODIUM 8.6-50 MG PO TABS
1.0000 | ORAL_TABLET | Freq: Every evening | ORAL | Status: DC | PRN
Start: 2021-05-27 — End: 2021-06-01

## 2021-05-27 MED ORDER — ETHAMBUTOL HCL 400 MG PO TABS
15.0000 mg/kg | ORAL_TABLET | Freq: Every day | ORAL | Status: DC
  Administered 2021-05-27 – 2021-05-31 (×5): 1000 mg
  Filled 2021-05-27 (×5): qty 2

## 2021-05-27 MED ORDER — EMTRICITABINE-TENOFOVIR AF 200-25 MG PO TABS
1.0000 | ORAL_TABLET | Freq: Every day | ORAL | Status: DC
  Administered 2021-05-27 – 2021-05-31 (×5): 1
  Filled 2021-05-27 (×5): qty 1

## 2021-05-27 MED ORDER — GUAIFENESIN-DM 100-10 MG/5ML PO SYRP
10.0000 mL | ORAL_SOLUTION | ORAL | Status: DC | PRN
  Administered 2021-05-30: 10 mL
  Filled 2021-05-27: qty 10

## 2021-05-27 MED ORDER — RIFABUTIN 150 MG PO CAPS
300.0000 mg | ORAL_CAPSULE | Freq: Every day | ORAL | Status: DC
  Administered 2021-05-27 – 2021-05-31 (×5): 300 mg
  Filled 2021-05-27 (×5): qty 2

## 2021-05-27 MED ORDER — ALBUMIN HUMAN 25 % IV SOLN
25.0000 g | Freq: Once | INTRAVENOUS | Status: AC
  Administered 2021-05-27: 25 g via INTRAVENOUS
  Filled 2021-05-27: qty 100

## 2021-05-27 MED ORDER — PROSOURCE TF PO LIQD
45.0000 mL | Freq: Three times a day (TID) | ORAL | Status: DC
  Administered 2021-05-27 – 2021-05-28 (×4): 45 mL
  Filled 2021-05-27 (×4): qty 45

## 2021-05-27 NOTE — Progress Notes (Signed)
Bipap not needed at this time, pt resting comfortably. RT will continue to monitor as needed throughout night. ?

## 2021-05-27 NOTE — Progress Notes (Signed)
? ? ? [facility] ?                                                                            ? ? ?Ricky [name], is a 66 y.o. male, DOB - 22-Mar-1955, PIR:[id number] ?Admit date - 05/24/2021    ?Outpatient Primary MD for the patient is Pcp, No ? ?LOS - 3  days ? ? ? ?Brief summary  ? ?Ricky [name] is a pleasant 66 y.o. male with medical history significant for HIV/AIDS with recent CD4 <35, disseminated MAC, history of cryptococcal meningitis, chronic COVID, dysphagia, debility, severe malnutrition, and abdominal lymphadenopathy, now presenting from his SNF where he was noted to be poorly responsive and found to have CBG in the 63s.  Patient received IV dextrose prior to the arrival and his CBGs have improved.  Chest x-ray shows retrocardiac opacity suspicious for pneumonia. ? ? ?Assessment & Plan  ? ? ?Assessment and Plan: ? ?Acute metabolic encephalopathy probably secondary to a combination of hypoglycemia, pneumonia,, mild dementia. ?-pts  mental status continues to worsen , currently not following commands.  ?-Patient on IV dextrose infusion for hypoglycemia. ?-Unfortunately his A1c is around 3.9, probably secondary to poor oral intake. ?-Urine cultures show 1,00,000 enterococcus fecalis sensitive to ampicillin.  ?Blood cultures are negative so far.  ? ? ?Sepsis/ hypothermia/ hypotensive/ tachypnea/ tachycardia/ AKI/ AMS present on admission ?Acute respiratory failure with hypoxia requiring 2 lit of [place] oxygen.  ?Left retrocardiac opacity on chest x-ray currently treating as pneumonia./ COVID pneumonia ?Patient currently hypoxic requiring about 2 lit of [place] oxygen.  ?Patient started on IV Rocephin and Zithromax., transitioning to Unasyn for anaerobic coverage.  ?Nasal cannula oxygen to keep sats greater than 90%, currently requiring about 2 lit/min.  ?Blood cultures to be followed. ?Urine for strep pneumonia is negative, urine for Legionella antigen is pending. ?He received IV decadron and IV remdesevir  for COVID as per ID recommendations.  ?CT chest without contrast done and results reviewed.  ? ? ?Acute kidney injury probably with mild metabolic acidosis secondary to poor oral intake, dehydration, prerenal azotemia. ?Patient's baseline creatinine around 1 admitted with a creatinine of 1.58, had  worsened to 1.63 to 1.68 to 1.59. ?Foley catheter placed.  ?CT abdomen and pelvis negative for hydronephrosis . ?IV fluids, renally dose medications and continue to monitor. ? ? ?HIV/AIDS last CD4 count less than 35. ?Patient is on Tivicay Descovy and Bactrim. ?Continue the same. ID on board.  ?Rpt CD4 count ordered by ID.  ? ? ?History of disseminated MAC infection ?Continue with rifabutin, azithromycin and ethambutol ? ? ?History of cryptococcal meningitis ?Continue with fluconazole ? ? ?Pancytopenia ?Probably secondary to HIV, infection.  ?S/p 1 unit of prbc transfusion, for hemoglobin of 6.8, improved to 7.6. TO 8.3. ?Thrombocytopenia platelets are around 63,000.  ? ? ?Mediastinal / Extensive retroperitoneal  lymphadenopathy ?Patient has  mediastinal and hilar and extensive retroperitoneal lymphadenopathy. ?Last admission CT surgery consulted for possible biopsy but they felt he is not a candidate for a VATS/excisional mediastinal biopsy.   ?General surgery could not do an excisional biopsy as patient had multiple comorbidities and would need a laparotomy and patient would not be a candidate for  it at this time. ?Lymphoma is still on the differential. ? ? ? ?Left lower lobe lung mass:  ?Patient will need biopsy of the lung mass, recommend outpatient follow up with Dr Valeta Harms.  ? ? ?Chronic COVID-19 infection/ COVID pneumonia:  ?Patient unable to clear initial infection patient. He completed the course of Molnupiravir last week.  ?IV steroids and IV remdesevir ordered .  ? ? ? ?Dysphagia ?SLP evaluation recommending NPO.  ? ? ? ?Anasarca with leg edema ascites probably secondary to hypoalbuminemia. ?Recommend compression  stockings and elevation of the legs  ?CT abdomen and pelvis shows large volume ascites, US paracentesis ordered. Small volume paracentesis and albumin to be given after paracentesis.  ?Fluid to be sent for analysis.  ? ? ? ?Severe malnutrition ?Patient is currently on Megace, unable to take any oral meds, not safe due to altered mental status.  ?Ordered cortrak for nutrition and meds.  ? ? ?GERD ?On protonix  ? ? ? ?Elevated liver enzymes ?Improving.  ? ? ?Hyponatremia ?-Mild, continue to monitor. ? ? ?Enterococcus UTI:  ?Sensitive to ampicillin. Currently on UNASYN.  ? ? ? ?Diarrhea: watery.  ?First episode today.  ? ? ? Pressure injuty.  ?RN Pressure Injury Documentation: ?Pressure Injury 05/20/21 Sacrum Mid Stage 2 -  Partial thickness loss of dermis presenting as a shallow open injury with a red, pink wound bed without slough. pink top layer gone (Active)  ?05/20/21 1800  ?Location: Sacrum  ?Location Orientation: Mid  ?Staging: Stage 2 -  Partial thickness loss of dermis presenting as a shallow open injury with a red, pink wound bed without slough.  ?Wound Description (Comments): pink top layer gone  ?Present on Admission:   ?Dressing Type Foam - Lift dressing to assess site every shift 05/27/21 0900  ? ? ?Interventions: Tube feeding, MVI ? ?Estimated body mass index is 18.5 kg/m? as calculated from the following: ?  Height as of this encounter: _0  (1.854 m). ?  Weight as of this encounter: 63.6 kg. ? ?Code Status: FULL CODE.  ?DVT Prophylaxis:  SCDs Start: 05/24/21 2245 ? ? ?Level of Care: Level of care: Progressive ?Family Communication discussed with sister over the phone.  ? ?Disposition Plan:     Remains inpatient appropriate:  IV antibiotics.  ? ?Procedures:  ?CT abdomen and pelvis.  ? ?Consultants:   ?Palliative care ? ?Antimicrobials:  ? ?Anti-infectives (From admission, onward)  ? ? Start     Dose/Rate Route Frequency Ordered Stop  ? 05/27/21 1045  azithromycin (ZITHROMAX) tablet 500 mg       ? 500  mg Per Tube Daily 05/27/21 0956    ? 05/27/21 1045  dolutegravir (TIVICAY) tablet 50 mg       ? 50 mg Per Tube Daily 05/27/21 0956    ? 05/27/21 1045  emtricitabine-tenofovir AF (DESCOVY) 200-25 MG per tablet 1 tablet       ? 1 tablet Per Tube Daily 05/27/21 0956    ? 05/27/21 1045  ethambutol (MYAMBUTOL) tablet 1,000 mg       ? 15 mg/kg ? 66.2 kg Per Tube Daily 05/27/21 0956    ? 05/27/21 1045  rifabutin (MYCOBUTIN) capsule 300 mg       ? 300 mg Per Tube Daily 05/27/21 0957    ? 05/27/21 1045  sulfamethoxazole-trimethoprim (BACTRIM) 400-80 MG per tablet 1 tablet       ? 1 tablet Per Tube Daily 05/27/21 0957    ? 05/27/21 1000  remdesivir 100  mg in sodium chloride 0.9 % 100 mL IVPB       ?See Hyperspace for full Linked Orders Report.  ? 100 mg ?200 mL/hr over 30 Minutes Intravenous Daily 05/26/21 1229 05/31/21 0959  ? 05/26/21 1400  remdesivir 200 mg in sodium chloride 0.9% 250 mL IVPB       ?See Hyperspace for full Linked Orders Report.  ? 200 mg ?580 mL/hr over 30 Minutes Intravenous Once 05/26/21 1229 05/27/21 1320  ? 05/26/21 1300  fluconazole (DIFLUCAN) IVPB 200 mg       ? 200 mg ?100 mL/hr over 60 Minutes Intravenous Every 24 hours 05/26/21 1135    ? 05/26/21 1300  Ampicillin-Sulbactam (UNASYN) 3 g in sodium chloride 0.9 % 100 mL IVPB       ? 3 g ?200 mL/hr over 30 Minutes Intravenous Every 6 hours 05/26/21 1213    ? 05/26/21 1230  remdesivir 100 mg in sodium chloride 0.9 % 100 mL IVPB  Status:  Discontinued       ? 100 mg ?200 mL/hr over 30 Minutes Intravenous Daily 05/26/21 1144 05/26/21 1229  ? 05/26/21 1000  remdesivir 100 mg in sodium chloride 0.9 % 100 mL IVPB  Status:  Discontinued       ?See Hyperspace for full Linked Orders Report.  ? 100 mg ?200 mL/hr over 30 Minutes Intravenous Daily 05/25/21 1106 05/25/21 1203  ? 05/25/21 1213  remdesivir 200 mg in sodium chloride 0.9% 250 mL IVPB  Status:  Discontinued       ?See Hyperspace for full Linked Orders Report.  ? 200 mg ?580 mL/hr over 30 Minutes  Intravenous Once 05/25/21 1106 05/25/21 1203  ? 05/25/21 1000  azithromycin (ZITHROMAX) tablet 500 mg  Status:  Discontinued       ? 500 mg Oral Daily 05/24/21 2246 05/27/21 0956  ? 05/25/21 1000  dolutegra

## 2021-05-27 NOTE — Progress Notes (Addendum)
Initial Nutrition Assessment ? ?DOCUMENTATION CODES:  ? ?Severe malnutrition in context of chronic illness ? ?INTERVENTION:  ? ?Initiate tube feeding via Cortrak tube: ?Osmolite 1.5 at 25 ml/h, increase by 10 ml every 12 hours to goal rate of 55 ml/h (1320 ml per day). ?Prosource TF 45 ml TID. ? ?Provides 2100 kcal, 116 gm protein, 1006 ml free water daily. ? ?MVI with minerals via tube once daily. ? ?Monitor magnesium, potassium, and phosphorus BID for at least 3 days, MD to replete as needed, as pt is at risk for refeeding syndrome given severe malnutrition. ? ?D/C PO Ensure. ? ?NUTRITION DIAGNOSIS:  ? 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 ? ?GOAL:  ? ?Patient will meet greater than or equal to 90% of their needs ? ?MONITOR:  ? ?TF tolerance, Labs, Diet advancement ? ?REASON FOR ASSESSMENT:  ? ?Consult ?Enteral/tube feeding initiation and management ? ?ASSESSMENT:  ? ?66 yo male admitted with hypoglycemia and decreased mentation. 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 ? ?Received MD Consult for TF initiation and management. ?Cortrak tube was placed this morning.  ?Per X-ray, tip is in the vicinity of the pylorus or duodenal bulb.  ? ?Patient unable to provide any nutrition history. ?He is severely malnourished with severe depletion of muscle and subcutaneous fat mass.  ?Difficult to determine actual dry weight or weight history d/t severe edema. ?Current weight seems to be carried over from previous weight from earlier this month. ? ?Labs reviewed. Na 134 (L), A1C 3.9 (L) ?CBG: 113-116 ? ?Medications reviewed and include Decadron, Tivicay, Descovy, Myambutol, Pepcid, Protonix, remdesivir. ? ?Patient is at risk for refeeding syndrome; will  advance TF very slowly and check mag, phos, and K. MD to replete as needed. Discussed with RN.  ? ?NUTRITION - FOCUSED PHYSICAL EXAM: ? ?Flowsheet Row Most Recent Value  ?Orbital Region Severe depletion  ?Upper Arm Region Severe depletion  ?Thoracic and Lumbar Region Severe depletion  ?Buccal Region Severe depletion  ?Temple Region Severe depletion  ?Clavicle Bone Region Severe depletion  ?Clavicle and Acromion Bone Region Severe depletion  ?Scapular Bone Region Severe depletion  ?Dorsal Hand Severe depletion  ?Patellar Region Severe depletion  ?Anterior Thigh Region Severe depletion  ?Posterior Calf Region Unable to assess  ?Edema (RD Assessment) Severe  ?Hair Reviewed  ?Eyes Reviewed  ?Mouth Reviewed  ?Skin Reviewed  ?Nails Reviewed  ? ?  ? ? ?Diet Order:   ?Diet Order   ? ?       ?  Diet NPO time specified  Diet effective now       ?  ? ?  ?  ? ?  ? ? ?EDUCATION NEEDS:  ? ?Not appropriate for education at this time ? ?Skin:  Skin Assessment: Skin Integrity Issues: ?Skin Integrity Issues:: Stage II ?Stage II: sacrum ? ?Last BM:  4/23 ? ?Height:  ? ?Ht Readings from Last 1 Encounters:  ?05/24/21 '6\' 1"'$  (1.854 m)  ? ? ?Weight:  ? ?Wt Readings from Last 1 Encounters:  ?05/24/21 63.6 kg  ? ? ?BMI:  Body mass index is 18.5 kg/m?. ? ?Estimated Nutritional Needs:  ? ?Kcal:  2000-2200 ? ?Protein:  100-120 gm ? ?Fluid:  1.8-2 L ? ? ? ?Lucas Mallow RD, LDN, CNSC ?Please refer to Amion for contact information.                                                       ? ?

## 2021-05-27 NOTE — TOC Initial Note (Signed)
Transition of Care (TOC) - Initial/Assessment Note  ? ? ?Patient Details  ?Name: Ricky [name] ?MRN: [id number] ?Date of Birth: 1955/12/22 ? ?Transition of Care [facility]) CM/SW Contact:    ?Ninfa Meeker, RN ?Phone Number: ?05/27/2021, 1:16 PM ? ?Clinical Narrative:                 ? Transition of Care Screening Note: ? ?Transition of Care Department [facility]) has reviewed patient and no TOC needs have been identified at this time. We will continue to monitor patient advancement through Interdisciplinary progressions. If new patient transition needs arise, please place a consult.   ? ?  ?  ? ? ?Patient Goals and CMS Choice ?  ?  ?  ? ?Expected Discharge Plan and Services ?  ?  ?  ?  ?  ?                ?  ?  ?  ?  ?  ?  ?  ?  ?  ?  ? ?Prior Living Arrangements/Services ?  ?  ?  ?       ?  ?  ?  ?  ? ?Activities of Daily Living ?Home Assistive Devices/Equipment: Gilford Rile (specify type), Bedside commode/3-in-1 ?ADL Screening (condition at time of admission) ?Patient's cognitive ability adequate to safely complete daily activities?: No ?Is the patient deaf or have difficulty hearing?: No ?Does the patient have difficulty seeing, even when wearing glasses/contacts?: No ?Does the patient have difficulty concentrating, remembering, or making decisions?: Yes ?Patient able to express need for assistance with ADLs?: Yes ?Does the patient have difficulty dressing or bathing?: Yes ?Independently performs ADLs?: No ?Communication: Independent ?Dressing (OT): Dependent ?Is this a change from baseline?: Pre-admission baseline ?Grooming: Dependent ?Is this a change from baseline?: Pre-admission baseline ?Feeding: Dependent ?Is this a change from baseline?: Pre-admission baseline ?Bathing: Dependent ?Is this a change from baseline?: Pre-admission baseline ?Toileting: Dependent ?Is this a change from baseline?: Pre-admission baseline ?In/Out Bed: Dependent ?Is this a change from baseline?: Pre-admission baseline ?Walks in Home:  Dependent ?Is this a change from baseline?: Pre-admission baseline ?Does the patient have difficulty walking or climbing stairs?: Yes ?Weakness of Legs: Both ?Weakness of Arms/Hands: Both ? ?Permission Sought/Granted ?  ?  ?   ?   ?   ?   ? ?Emotional Assessment ?  ?  ?  ?  ?  ?  ? ?Admission diagnosis:  Hypoglycemia [E16.2] ?AKI (acute kidney injury) ([place]) [N17.9] ?Non-diabetic hypoglycemia [E16.2] ?Patient Active Problem List  ? Diagnosis Date Noted  ? Goals of care, counseling/discussion   ? Non-diabetic hypoglycemia 05/24/2021  ? Pancytopenia ([place]) 05/24/2021  ? Thrombocytopenia ([place]) 05/15/2021  ? Hyponatremia 05/10/2021  ? GERD without esophagitis 05/10/2021  ? Protein-calorie malnutrition, severe 05/10/2021  ? 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 Bladder wall thickening 04/08/2021  ? Abdominal pain 04/08/2021  ? Normocytic anemia 04/08/2021  ? Nausea with vomiting 04/08/2021  ? Fever   ? Acute cholecystitis 01/22/2021  ? Complicated UTI (urinary tract infection) 01/22/2021  ? Noncompliance 01/22/2021  ? Lymphadenopathy   ? Chronic low back pain   ? HIV infection ([place])   ? Community acquired pneumonia   ? Nonadherence to medical treatment   ? COVID-19 virus infection 10/13/2020  ? Alcohol abuse, daily use   ? COVID-19 10/12/2020  ? 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 Blurry vision, left eye   ? AKI (acute kidney injury) ([place])   ? Cavitary  lesion of lung   ? Acute nonintractable headache   ? Smoking   ? Cocaine use   ? Paroxysmal SVT (supraventricular tachycardia) ([facility])   ? Abnormal EKG   ? Atrial flutter ([place]) 03/27/2020  ? Aortic atherosclerosis ([place]) 03/27/2020  ? Hypertension 03/27/2020  ? Cryptococcal meningitis ([place]) 03/26/2020  ? Leukocytopenia 03/26/2020  ? Macrocytosis 03/26/2020  ? Moderate protein malnutrition ([place]) 03/26/2020  ? Hypoalbuminemia 03/26/2020  ? AIDS (acquired immune deficiency syndrome) ([place]) 03/26/2020  ? ?PCP:  Pcp, No ?Pharmacy:   ?[id number] [facility] [address] [address]?Phone: [phone]: [phone]? ?[facility]?Phone: [phone]: [phone]? ?[facility] ?[address]?[place]?Phone: [phone]: [phone] ? ? ? ? ?Social Determinants of Health (SDOH) Interventions ?  ? ?Readmission Risk Interventions ? ?  10/26/2020  ? 12:55 PM 04/25/2020  ? 10:22 AM  ?Readmission Risk Prevention Plan  ?Transportation Screening Complete Complete  ?PCP or Specialist Appt within 5-7 Days Complete   ?PCP or Specialist Appt within 3-5 Days  Complete  ?Home Care Screening Complete   ?[place] Consult  Complete  ?Social Work Consult for [place]seling  Complete  ?Palliative Care Screening  Not Applicable  ?Medication Review (Press photographer  Complete  ? ? ? ?

## 2021-05-27 NOTE — Procedures (Signed)
Cortrak  Person Inserting Tube:  Kellsey Sansone D, RD Tube Type:  Cortrak - 43 inches Tube Size:  10 Tube Location:  Left nare Secured by: Bridle Technique Used to Measure Tube Placement:  Marking at nare/corner of mouth Cortrak Secured At:  72 cm  Cortrak Tube Team Note:  Consult received to place a Cortrak feeding tube.   X-ray is required, abdominal x-ray has been ordered by the Cortrak team. Please confirm tube placement before using the Cortrak tube.   If the tube becomes dislodged please keep the tube and contact the Cortrak team at [email or web address] (password [email or web address]) for replacement.  If after hours and replacement cannot be delayed, place a NG tube and confirm placement with an abdominal x-ray.   Ricky [name], RD, LDN Clinical Dietitian RD pager # available in AMION  After hours/weekend pager # available in AMION   

## 2021-05-27 NOTE — Telephone Encounter (Signed)
(  1:00 pm) PC SW completed follow-up call to patient's sister-Ricky [name]. SW inquired about patient's status. She advised that patient is currently in the hospital and his prognosis is not good. She and her siblings are trying to determine the course of care as he is quickly declining. She advised hospice was recommended, but didn't want him to go to hospice. SW provided education to her regarding the difference between palliative and hospice and the services under each. SW stressed that hospice will focus on comfort care for patient. Ricky [name] advised that she would like to take patient home, where he could be with family and care for him there. Based on education provided she feels hospice would be more appropriate. SW encouraged Ricky [name] to make this known to the staff and SW will update the hospice liaisons for follow-up. SW provided reassurance of ongoing support and advised her to call with any additional questions.  ?

## 2021-05-27 NOTE — Evaluation (Signed)
Physical Therapy Evaluation and Discharge ?Patient Details ?Name: Ricky [name] ?MRN: [id number] ?DOB: 15-Nov-1955 ?Today's Date: 05/27/2021 ? ?History of Present Illness ? Pt is a 66 y.o. M who presents with decreased mentation and hypoglycemia. 4/23 critical care consulted due to hypothermia, tachypnea, and hypotension. 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  ?Clinical Impression ? Patient evaluated by Physical Therapy with no further acute PT needs identified. Pt visually tracking and follows 10% of one step commands. RN present at beginning of session to assist with pad change and placement of sacral foam. Performed PROM/AAROM to all extremities and cervical stretching. Pt requiring total assist for mobility at this time. Due to pt being dependent in care, no further acute PT needs at this time. See below for any follow-up Physical Therapy or equipment needs. PT is signing off. Thank you for this referral. ? ?   ? ?Recommendations for follow up therapy are one component of a multi-disciplinary discharge planning process, led by the attending physician.  Recommendations may be updated based on patient status, additional functional criteria and insurance authorization. ? ?Follow Up Recommendations No PT follow up (plan for hospice per Palliative) ? ?  ?Assistance Recommended at Discharge Frequent or constant Supervision/Assistance  ?Patient can return home with the following ? Two people to help with walking and/or transfers;A lot of help with bathing/dressing/bathroom;Assistance with cooking/housework;Assistance with feeding;Direct supervision/assist for medications management ? ?  ?Equipment Recommendations Hospital bed;Other  (comment) (hoyer lift)  ?Recommendations for Other Services ?    ?  ?Functional Status Assessment Patient has had a recent decline in their functional status and/or demonstrates limited ability to make significant improvements in function in a reasonable and predictable amount of time  ? ?  ?Precautions / Restrictions Precautions ?Precautions: Fall ?Restrictions ?Weight Bearing Restrictions: No  ? ?  ? ?Mobility ? Bed Mobility ?Overal bed mobility: Needs Assistance ?Bed Mobility: Rolling ?Rolling: Total assist ?  ?  ?  ?  ?General bed mobility comments: TotalA for rolling for placement of new pad and sacral foam ?  ? ?Transfers ?  ?  ?  ?  ?  ?  ?  ?  ?  ?General transfer comment: unable ?  ? ?Ambulation/Gait ?  ?  ?  ?  ?  ?  ?  ?  ? ?Stairs ?  ?  ?  ?  ?  ? ?Wheelchair Mobility ?  ? ?Modified Rankin (Stroke Patients Only) ?  ? ?  ? ?Balance   ?  ?  ?  ?  ?  ?  ?  ?  ?  ?  ?  ?  ?  ?  ?  ?  ?  ?  ?   ? ? ? ?Pertinent Vitals/Pain Pain Assessment ?Pain Assessment: Faces ?Faces Pain Scale: Hurts even more ?Pain Location: generalized ?Pain Descriptors / Indicators: Discomfort ?Pain Intervention(s): Monitored during session, Repositioned  ? ? ?Home Living Family/patient expects to be discharged to:: Private residence ?Living Arrangements: Other relatives (sister) ?Available Help at Discharge: Family ?Type of Home: Apartment ?  ?  ?  ?  ?Home Layout: Able to live on main level with bedroom/bathroom ?Home Equipment: None ?   ?  ?Prior Function Prior Level of Function : Needs assist ?  ?  ?  ?  ?  ?  ?  ?  ADLs Comments: from SNF, requiring assist for ADL's ?  ? ? ?Hand Dominance  ?   ? ?  ?Extremity/Trunk Assessment  ? Upper Extremity Assessment ?Upper Extremity Assessment: Generalized weakness ?  ? ?Lower Extremity Assessment ?Lower Extremity Assessment: Generalized weakness ?  ? ?Cervical / Trunk Assessment ?Cervical / Trunk Assessment: Kyphotic  ?Communication  ? Communication: HOH;Other (comment) (unintelligible speech)   ?Cognition Arousal/Alertness: Awake/alert ?Behavior During Therapy: Flat affect ?Overall Cognitive Status: History of cognitive impairments - at baseline ?  ?  ?  ?  ?  ?  ?  ?  ?  ?  ?  ?  ?  ?  ?  ?  ?General Comments: Pt following 10% of commands, unintelligble speech, visually tracking ?  ?  ? ?  ?General Comments   ? ?  ?Exercises General Exercises - Upper Extremity ?Shoulder Flexion: Both, AAROM, 10 reps, Supine ?Elbow Flexion: AAROM, Both, 10 reps, Supine ?General Exercises - Lower Extremity ?Ankle Circles/Pumps: PROM, Both, 10 reps, Supine ?Short Arc Quad: PROM, Both, 10 reps, Supine ?Heel Slides: PROM, Both, 10 reps, Supine ?Hip ABduction/ADduction: PROM, Both, 10 reps, Supine ?Other Exercises ?Other Exercises: Supine: cervical rotation to R/L  ? ?Assessment/Plan  ?  ?PT Assessment Patient does not need any further PT services  ?PT Problem List Decreased strength;Decreased activity tolerance;Decreased balance;Decreased mobility;Decreased cognition;Decreased knowledge of use of DME;Decreased safety awareness;Decreased knowledge of precautions;Cardiopulmonary status limiting activity ? ?   ?  ?PT Treatment Interventions     ? ?PT Goals (Current goals can be found in the Care Plan section)  ?Acute Rehab PT Goals ?Patient Stated Goal: unable ?PT Goal Formulation: Patient unable to participate in goal setting ?Time For Goal Achievement: 06/10/21 ?Potential to Achieve Goals: Poor ? ?  ?Frequency   ?  ? ? ?Co-evaluation   ?  ?  ?  ?  ? ? ?  ?AM-PAC PT "6 Clicks" Mobility  ?Outcome Measure Help needed turning from your back to your side while in a flat bed without using bedrails?: Total ?Help needed moving from lying on your back to sitting on the side of a flat bed without using bedrails?: Total ?Help needed moving to and from a bed to a chair (including a wheelchair)?: Total ?Help needed standing up from a chair using your arms (e.g., wheelchair or bedside chair)?: Total ?Help needed to walk in hospital room?:  Total ?Help needed climbing 3-5 steps with a railing? : Total ?6 Click Score: 6 ? ?  ?End of Session   ?Activity Tolerance: Patient limited by fatigue ?Patient left: in bed;with call bell/phone within reach ?Nurse Communication: Mobility status ?PT Visit Diagnosis: Muscle weakness (generalized) (M62.81);Other abnormalities of gait and mobility (R26.89);Unsteadiness on feet (R26.81);Difficulty in walking, not elsewhere classified (R26.2) ?  ? ?Time: 1439-1510 ?PT Time Calculation (min) (ACUTE ONLY): 31 min ? ? ?Charges:   PT Evaluation ?$PT Eval Moderate Complexity: 1 Mod ?PT Treatments ?$Therapeutic Activity: 8-22 mins ?  ?   ? ? ?Wyona Almas, PT, DPT ?Acute Rehabilitation Services ?Pager [phone] ?Office [phone] ? ? ?Ricky [name] ?05/27/2021, 4:59 PM ? ?

## 2021-05-27 NOTE — Progress Notes (Addendum)
?                                                                                                                                                     ?                                                   ?Daily Progress Note  ? ?Patient Name: Ricky [name]       Date: 05/27/2021 ?DOB: [birth year]  Age: 66 y.o. MRN#: [id number] ?Attending Physician: Hosie Poisson, MD ?Primary Care Physician: Pcp, No ?Admit Date: 05/24/2021 ? ?Reason for Consultation/Follow-up: Establishing goals of care ? ?Subjective: ?Chart review performed. Received report from primary RN - no acute concerns. Coretrak was place and tube feeds started today.  ? ?Went to visit patient at bedside - sister/[name] and son/Cliff present.  ?Patient was lying in bed asleep - he is arousable to voice/gentle touch but does not open his eyes. No respiratory distress, increased work of breathing, or secretions noted. He is wearing mitts attempting to pull at coretrak. ? ?Lengthy discussion on patient's current illness and what it means in the larger context of patient's on-going co-morbidities.  Natural disease trajectory and expectations at EOL were discussed. I attempted to elicit values and goals of care important to the patient. The difference between aggressive medical intervention and comfort care was considered in light of the patient's goals of care. Prognostication reviewed - patient is likely approaching end of life.  ? ?We discussed that evidence has shown that PEG tubes in patients at end of life do not increase survival, prevent aspiration, or improve wound healing.  They can promote isolation and use of restraints leading to increased potential for pressure ulcers. Use of PEG tubes is not medically recommended in this population; rather, careful hand feeding with aspiration precautions has been shown to provide the best quality of life.  ? ?Provided education and counseling at length on the philosophy and benefits of hospice care. Discussed that it  offers a holistic approach to care in the setting of end-stage illness, and is about supporting the patient where they are allowing nature to take it's course. Discussed the hospice team includes RNs, physicians, social workers, and chaplains. They can provide personal care, support for the family, and help keep patient out of the hospital as well as assist with DME needs for home hospice. Education provided on the difference between home vs residential hospice. Provided reassurance that residential hospice referral could be cancelled (would anticipate hospital death) at any time if patient's condition changed and it was felt they were too unstable for transfer. ? ?If family do opt for hospice, they would prefer home hospice.  However, at this time, [name] wants to continue coretrak. Encouraged family to do time limited trial of 24-48 hours - they are agreeable. Attempted to discuss putting medical boundaries for intervention in the event patient declines - family not willing to discuss.  ? ?All questions and concerns addressed. Encouraged to call with questions and/or concerns. PMT card provided. ? ? ?Length of Stay: 3 ? ?Current Medications: ?Scheduled Meds:  ? azithromycin  500 mg Per Tube Daily  ? Chlorhexidine Gluconate Cloth  6 each Topical Daily  ? dexamethasone (DECADRON) injection  6 mg Intravenous Daily  ? dolutegravir  50 mg Per Tube Daily  ? emtricitabine-tenofovir AF  1 tablet Per Tube Daily  ? ethambutol  15 mg/kg Per Tube Daily  ? famotidine  20 mg Per Tube BID  ? feeding supplement (PROSource TF)  45 mL Per Tube TID  ? pantoprazole  40 mg Oral Q0600  ? rifabutin  300 mg Per Tube Daily  ? sodium chloride flush  3 mL Intravenous Q12H  ? sulfamethoxazole-trimethoprim  1 tablet Per Tube Daily  ? ? ?Continuous Infusions: ? ampicillin-sulbactam (UNASYN) IV 3 g (05/27/21 1220)  ? feeding supplement (OSMOLITE 1.5 CAL) 1,000 mL (05/27/21 1225)  ? fluconazole (DIFLUCAN) IV 200 mg (05/27/21 1319)  ? remdesivir  100 mg in NS 100 mL 100 mg (05/27/21 1103)  ? ? ?PRN Meds: ?acetaminophen **OR** acetaminophen, alum & mag hydroxide-simeth, guaiFENesin-dextromethorphan, nitroGLYCERIN, senna-docusate, traMADol ? ?Physical Exam ?Vitals and nursing note reviewed.  ?Constitutional:   ?   General: He is not in acute distress. ?   Appearance: He is cachectic. He is ill-appearing.  ?Pulmonary:  ?   Effort: No respiratory distress.  ?Skin: ?   General: Skin is warm and dry.  ?Neurological:  ?   Mental Status: He is lethargic.  ?   Motor: Weakness present.  ?Psychiatric:     ?   Speech: He is noncommunicative.  ?         ? ?Vital Signs: BP (!) 113/91 (BP Location: Left Arm)   Pulse 95   Temp 98 ?F (36.7 ?C) (Axillary)   Resp 18   Ht _0  (1.854 m)   Wt 63.6 kg   SpO2 98%   BMI 18.50 kg/m?  ?SpO2: SpO2: 98 % ?O2 Device: O2 Device: Nasal Cannula ?O2 Flow Rate: O2 Flow Rate (L/min): 2 L/min ? ?Intake/output summary: No intake or output data in the 24 hours ending 05/27/21 1802 ?LBM: Last BM Date : 05/26/21 ?Baseline Weight: Weight: 63.6 kg ?Most recent weight: Weight: 63.6 kg ? ?     ?Palliative Assessment/Data: PPS 30% on tube feeds ? ? ? ? ? ?Patient Active Problem List  ? Diagnosis Date Noted  ? Goals of care, counseling/discussion   ? Non-diabetic hypoglycemia 05/24/2021  ? Pancytopenia ([place]) 05/24/2021  ? Thrombocytopenia ([place]) 05/15/2021  ? Hyponatremia 05/10/2021  ? GERD without esophagitis 05/10/2021  ? Protein-calorie malnutrition, severe 05/10/2021  ? 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 Bladder wall thickening 04/08/2021  ? Abdominal pain 04/08/2021  ? Normocytic anemia 04/08/2021  ? Nausea with vomiting 04/08/2021  ? Fever   ? Acute cholecystitis 01/22/2021  ? Complicated UTI (urinary tract infection) 01/22/2021  ? Noncompliance 01/22/2021  ? Lymphadenopathy   ? Chronic low back pain   ? HIV infection ([place])   ? Community acquired pneumonia   ? Nonadherence to medical treatment   ? COVID-19 virus infection 10/13/2020  ?  Alcohol abuse, daily use   ? COVID-19  10/12/2020  ? 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 Blurry vision, left eye   ? AKI (acute kidney injury) ([place])   ? Cavitary lesion of lung   ? Acute nonintractable headache   ? Smoking   ? Cocaine use   ? Paroxysmal SVT (supraventricular tachycardia) ([facility])   ? Abnormal EKG   ? Atrial flutter ([place]) 03/27/2020  ? Aortic atherosclerosis ([place]) 03/27/2020  ? Hypertension 03/27/2020  ? Cryptococcal meningitis ([place]) 03/26/2020  ? Leukocytopenia 03/26/2020  ? Macrocytosis 03/26/2020  ? Moderate protein malnutrition ([place]) 03/26/2020  ? Hypoalbuminemia 03/26/2020  ? AIDS (acquired immune deficiency syndrome) ([place]) 03/26/2020  ? ? ?Palliative Care Assessment & Plan  ? ?Patient Profile: ?66 y.o. male  with past medical history of  HIV/AIDS with recent CD4 <35, disseminated MAC, history of cryptococcal meningitis, chronic COVID, dysphagia, debility, severe malnutrition, and abnormal lymphadenopathy admitted on 05/24/2021 with hypoglycemia and poor responsiveness at SNF. ?  ?Patient was recently discharged to SNF on 05/21/21 and is very cachectic, frail, now with AKI and acute encephalopathy. PMT has been consulted to assist with goals of care conversation.  ? ?Assessment: ?Principal Problem: ?  Non-diabetic hypoglycemia ?Active Problems: ?  AIDS (acquired immune deficiency syndrome) ([place]) ?  AKI (acute kidney injury) ([place]) ?  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  COVID-19 ?  Community acquired pneumonia ?  Lymphadenopathy ?  Protein-calorie malnutrition, severe ?  Pancytopenia ([place]) ? ? ?Recommendations/Plan: ?Continue full code/full scope - not willing to put medical boundaries in place at this time in case of decline ?Family agreeable to 24-48 hr time limited trial of feeding tube then will make decisions for comfort if no improvement - would want home with hospice  ?PMT will continue to follow and support  holistically ? ? ?Goals of Care and Additional Recommendations: ?Limitations on Scope of Treatment: Full Scope Treatment ? ?Code Status: ? ?  ?Code Status Orders  ?(From admission, onward)  ?  ? ? ?  ? ?  Start     Ordered  ? 04/

## 2021-05-27 NOTE — Progress Notes (Signed)
Brief Palliative Medicine Progress Note: ? ?PMT follow up: [place] completed with sister/[name] and brother/Cliff - full note to follow. ? ?Recommendations/Plan: ?Continue full code/full scope - not willing to put medical boundaries in place at this time in case of decline ?Family agreeable to 48 hr time limited trial of feeding tube then will consider decisions for comfort if no improvement - would want home with hospice  ?PMT will continue to follow and support holistically ? ?Thank you for allowing PMT to assist in the care of this patient. ? ?[employer] Tamala Julian, FNP-BC ?Palliative Medicine Team ?Team Phone: [phone] ?NO CHARGE ? ?

## 2021-05-27 NOTE — Progress Notes (Signed)
? ?NAME:  Ricky [name], MRN:  [id number], DOB:  1955-02-08, LOS: 3 ?ADMISSION DATE:  05/24/2021, CONSULTATION DATE:  05/26/2021 ?REFERRING MD:  Dr. Karleen Hampshire, CHIEF COMPLAINT:  Respiratory distress and hypoxia   ? 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 ? ?On ED arrival patient was seen afebrile but mildly tachypneic and hypoxic.  Head CT negative.  Lab work significant for mildly elevated LFTs, hypoalbuminemia, and mild elevation in creatinine of 1.58, pancytopenia with marked left shift. ? ?Afternoon of 4/23 patient was seen with progressive hypotension and hypothermia prompting critical care consultation. ? 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 ?Significant Hospital Events: ?Including procedures, antibiotic start and stop dates in addition to other pertinent events   ?4/21 admitted from local SNF with poor mentation and hypoglycemia ?4/23 critical care consulted due to hypothermia, tachypnea, and hypotension ? ?Interim History / Subjective:  ?Sats are currently 100% on 2 L [place] ?MAP is 83 this morning ?Na 134/ K 4.3/ CO2 19/ Creatinine 1.59/ Albumin < 1.5 ?WBC 3.5/ HGB 8.3/ Platelets 63K ?T  Max 97.7 ?No I&O per Flowsheet ?Pt slightly more alert, BP remains soft, but MAP > 80 ?Urine Cx + for > 100,000 colonies of Enterococcus Faecalis ( Unasyn should cover) ? ?Objective   ?Blood pressure 103/73, pulse 98, temperature 97.9 ?F (36.6 ?C), temperature source Axillary, resp. rate 17, height _0  (1.854 m), weight 63.6 kg, SpO2 100 %. ?   ?   ?No intake or output data in the 24 hours ending 05/27/21 0915 ? ?Filed Weights  ? 05/24/21 2022  ?Weight: 63.6 kg  ? ? ?Examination: ?General: Awake and alert, elderly, chronically ill appearing elderly  male ?Skin: Warm and Dry, intact, no lesions or breakdown noted ?HEENT: Temporal wasting, PERRLA, MM Pink and dry, No LAD, No JVD ?Heart:S1. S2. Regular, tachy at times per tele, 95-105 ?Lungs:Bilateral Chest excursion , Few Rhonchi, diminished per bases bilaterally ?NFA:OZHY, NT, ND, BS hypocarive, Body mass index is 18.5 kg/m?. ?Ext: 2+ BLE edema , No obvious deformities ?Neuro: Responds to call of name, tracks, moans, does not follow commands  ? ?Resolved Hospital Problem list   ? ? ?Assessment & Plan:  ?Acute metabolic encephalopathy ?History of cryptococcal meningitis ?-Being treated with fluconazole currently ?P: ?Maintain neuro protective measures ?Nutrition and bowel regiment  ?Seizure precautions  ?Aspiration precautions  ?Minimize sedation ?Continue IV fluconazole, per ID ? ?Severe sepsis with evidence of organ dysfunction ?- 4/24: patient is normothermic on [place],  RR 17,  and has MAP of > 80  ?- Creatinine is slowly down Trending  ?- Platelets Count is 63 K from 51 K  ?- WBC 3.5 from 2.9 ? ?P: ?Continue current care on progressive, low threshold to move to ICU  ?Supplemental oxygen for sat goal > 92 ?Follow pan cultures  ?Continue Unasyn, Remdesivir, Rifabutin. And Bactrim ? ?Acute kidney injury ?-Creatine 1.59, GFR  48 on 4/24 ?P: ?Follow renal function  ?Monitor urine output ?Trend Bmet ?Avoid nephrotoxins ?Ensure adequate renal perfusion   ? ?HIV/AIDS with last CD4 count less than 35 ?-On [place], [place]covy, and truvada ?P: ?ID following, appreciate assistance  ?Neutropenic precautions  ?Supportive care  ?Trend CBC and fever curve  ? ?History of disseminated MAC infection ?-Managed on Azithromycin, Ethambutol, and Rifabutin at baseline  ?History of TB ?Persistent COVID-19 infection ?-With concern  for highly mutates variant given non-existent immune system ?Left lower lobe lung mass ?P: ?Infectious disease following ?Continue antibiotics and antivirals as above ?Isolation/neutropenic precautions as above ?- Refused lung mass biopsy  ? ?Extensive retroperitoneal and mediastinal lymphadenopathy ?Elevated liver enzymes ?Anasarca ?Severe hypoalbuminemia ?P: ?Patient would benefit from large-volume paracentesis but given hemodynamic instability may worsen hypotension ?If stable enough for paracentesis on future Pulmonary would defer to IR ?Likely will need albumin supplementation during paracentesis ?Send fluid studies/cultures from paracentesis ?Oxygen supplementation as below ? ?Severe protein calorie malnutrition ?Failure to thrive ?P: ?Supplement protein as able ?Need to consider enteral nutrition ?Consider Dietitian Consult  ? ? ? ?Best Practice (right click and "Reselect all SmartList Selections" daily)  ? ?Diet/type: NPO ?DVT prophylaxis: SCD ?GI prophylaxis: PPI ?Lines: N/A ?Foley:  N/A ?Code Status:  full code ?Last date of multidisciplinary goals of care discussion: Family extensively updated per attending at bedside 4/23 with continued goals of aggressive intervention.  Palliative care is following patient. ?No family at bedside 4/24 ? ?Labs   ?CBC: ?Recent Labs  ?Lab 05/21/21 ?0175 05/24/21 ?2000 05/25/21 ?1025 05/25/21 ?1946 05/26/21 ?8527 05/26/21 ?7824 05/27/21 ?2353  ?WBC 2.6* 2.5* 2.3*  --  3.5* 2.9* 3.5*  ?NEUTROABS 1.7 1.2*  --   --  2.7  --   --   ?HGB 8.4* 7.5* 6.8* 8.3* 8.0* 7.6* 8.3*  ?HCT 25.6* 23.3* 21.0* 26.1* 25.0* 23.3* 25.4*   ?MCV 100.8* 102.6* 101.4*  --  93.3 92.8 91.7  ?PLT 144* 106* 92*  --  67* 51* 63*  ? ? ?Basic Metabolic Panel: ?Recent Labs  ?Lab 05/21/21 ?6144 05/24/21 ?2000 05/25/21 ?3154 05/26/21 ?0086 05/26/21 ?7619 05/27/21 ?5093  ?NA  --  134* 132* 134* 133* 134*  ?K  --  4.6 4.3 4.3 4.5 4.3  ?CL  --  111 108 111 109 111  ?CO2  --  20* 19* 17* 18* 19*  ?GLUCOSE  --  176* 134* 90 103* 126*  ?BUN  --  24* 24* 26* 25* 22  ?CREATININE  --  1.58* 1.63* 1.70* 1.68* 1.59*  ?CALCIUM  --  8.6* 8.4* 8.6* 8.4* 8.2*  ?MG 1.8  --   --  1.8  --   --   ?PHOS 3.5  --   --   --   --   --   ? ?GFR: ?Estimated Creatinine Clearance: 42.8 mL/min (A) (by C-G formula based on SCr of 1.59 mg/dL (H)). ?Recent Labs  ?Lab 05/24/21 ?2000 05/25/21 ?2671 05/25/21 ?1858 05/26/21 ?2458 05/26/21 ?0998 05/27/21 ?3382  ?PROCALCITON  --   --  4.44  --   --   --   ?WBC 2.5* 2.3*  --  3.5* 2.9* 3.5*  ?LATICACIDVEN 1.6  --   --   --   --   --   ? ? ?Liver Function Tests: ?Recent Labs  ?Lab 05/24/21 ?2000 05/25/21 ?5053 05/26/21 ?9767 05/26/21 ?3419 05/27/21 ?3790  ?AST 91* 90* 88* 86* 73*  ?ALT 52* 50* 53* 49* 51*  ?ALKPHOS 96 92 88  88 100  ?BILITOT 1.6* 1.6* 1.8* 1.4* 1.2  ?PROT 5.7* 5.6* 5.7* 5.4* 5.5*  ?ALBUMIN RESULTS UNAVAILABLE DUE TO INTERFERING SUBSTANCE <1.5* <1.5* <1.5* <1.5*  ? ?No results for input(s): LIPASE, AMYLASE in the last 168 hours. ?No results for input(s): AMMONIA in the last 168 hours. ? ?ABG ?   ?Component Value Date/Time  ? PHART 7.4 05/26/2021 0235  ? PCO2ART 29 (L) 05/26/2021 0235  ? PO2ART 166 (H) 05/26/2021 0235  ? HCO3 18.0 (L) 05/26/2021 0235  ? ACIDBASEDEF 5.5 (H) 05/26/2021 0235  ? O2SAT 98.9 05/26/2021 0235  ?  ? ?Coagulation Profile: ?No results for input(s): INR, PROTIME in the last 168 hours. ? ?Cardiac Enzymes: ?No results for input(s): CKTOTAL, CKMB, CKMBINDEX, TROPONINI in the last 168 hours. ? ?HbA1C: ?Hgb A1c MFr Bld  ?Date/Time Value Ref Range Status  ?05/25/2021 05:40 AM 3.9 (L) 4.8 - 5.6 % Final  ?  Comment:  ?   (NOTE) ?Pre diabetes:          5.7%-6.4% ? ?Diabetes:              >6.4% ? ?Glycemic control for   <7.0% ?adults with diabetes ?  ? ? ?CBG: ?Recent Labs  ?Lab 05/26/21 ?2022 05/26/21 ?2110 05/26/21 ?2357 05/27/21 ?0404

## 2021-05-27 NOTE — Progress Notes (Signed)
This chaplain responded to PMT consult for spiritual care. The chaplain understands the Pt. brother-Cliff requested the chaplain's bedside presence.  ? ?The Pt. eyes are open during the visit, but the Pt. does not respond to the chaplain's movement or conversation with the Pt about his family. The chaplain paused and recognized the holy before prayer with the Pt. ? ?This chaplain is available for F/U spiritual care as needed. ? ?Chaplain Sallyanne Kuster ?[phone] ? ? ?

## 2021-05-27 NOTE — Progress Notes (Addendum)
?  [facility] ? ? ?Reason for visit: Follow up on HIV/AIDS ? ?Interval History: WBC 3.5.  no fever.   ? ? ?Physical Exam: ?Constitutional:  ?Vitals:  ? 05/27/21 0808 05/27/21 1130  ?BP: 103/73 100/81  ?Pulse: 98 98  ?Resp: 17 20  ?Temp: 97.9 ?F (36.6 ?C) 97.6 ?F (36.4 ?C)  ?SpO2: 100% 98%  ? patient appears in NAD ?Respiratory: Normal respiratory effort; CTA B ?Cardiovascular: RRR ?GI: soft, nt, nd ? ?Review of Systems: ?Constitutional: negative for fevers and chills ? ?Lab Results  ?Component Value Date  ? WBC 3.5 (L) 05/27/2021  ? HGB 8.3 (L) 05/27/2021  ? HCT 25.4 (L) 05/27/2021  ? MCV 91.7 05/27/2021  ? PLT 63 (L) 05/27/2021  ?  ?Lab Results  ?Component Value Date  ? CREATININE 1.59 (H) 05/27/2021  ? BUN 22 05/27/2021  ? NA 134 (L) 05/27/2021  ? K 4.3 05/27/2021  ? CL 111 05/27/2021  ? CO2 19 (L) 05/27/2021  ?  ?Lab Results  ?Component Value Date  ? ALT 51 (H) 05/27/2021  ? AST 73 (H) 05/27/2021  ? ALKPHOS 100 05/27/2021  ?  ? ?Microbiology: ?Recent Results (from the past 240 hour(s))  ?Resp Panel by RT-PCR (Flu A&B, Covid) Nasopharyngeal Swab     Status: Abnormal  ? Collection Time: 05/24/21  7:41 PM  ? Specimen: Nasopharyngeal Swab; Nasopharyngeal(NP) swabs in vial transport medium  ?Result Value Ref Range Status  ? SARS Coronavirus 2 by RT PCR POSITIVE (A) NEGATIVE Final  ?  Comment: (NOTE) ?SARS-CoV-2 target nucleic acids are DETECTED. ? ?The SARS-CoV-2 RNA is generally detectable in upper respiratory ?specimens during the acute phase of infection. Positive results are ?indicative of the presence of the identified virus, but do not rule ?out bacterial infection or co-infection with other pathogens not ?detected by the test. Clinical correlation with patient history and ?other diagnostic information is necessary to determine patient ?infection status. The expected result is Negative. ? ?Fact Sheet for Patients: ?EntrepreneurPulse.com.au ? ?Fact Sheet for  Healthcare Providers: ?IncredibleEmployment.be ? ?This test is not yet approved or cleared by the [place] FDA and  ?has been authorized for detection and/or diagnosis of SARS-CoV-2 by ?FDA under an Emergency Use Authorization (EUA).  This EUA will ?remain in effect (meaning this test can be used) for the duration of  ?the COVID-19 declaration under Section 564(b)(1) of the A ct, 21 ?U.S.C. section 360bbb-3(b)(1), unless the authorization is ?terminated or revoked sooner. ? ?  ? Influenza A by PCR NEGATIVE NEGATIVE Final  ? Influenza B by PCR NEGATIVE NEGATIVE Final  ?  Comment: (NOTE) ?The Xpert Xpress SARS-CoV-2/FLU/RSV plus assay is intended as an aid ?in the diagnosis of influenza from Nasopharyngeal swab specimens and ?should not be used as a sole basis for treatment. Nasal washings and ?aspirates are unacceptable for Xpert Xpress SARS-CoV-2/FLU/RSV ?testing. ? ?Fact Sheet for Patients: ?EntrepreneurPulse.com.au ? ?Fact Sheet for Healthcare Providers: ?IncredibleEmployment.be ? ?This test is not yet approved or cleared by the [place] FDA and ?has been authorized for detection and/or diagnosis of SARS-CoV-2 by ?FDA under an Emergency Use Authorization (EUA). This EUA will remain ?in effect (meaning this test can be used) for the duration of the ?COVID-19 declaration under Section 564(b)(1) of the Act, 21 U.S.C. ?section 360bbb-3(b)(1), unless the authorization is terminated or ?revoked. ? ?Performed at [facility], [place] [address], [place], [place] ?[address] ?  ?Blood culture (routine x 2)     Status:  None (Preliminary result)  ? Collection Time: 05/24/21  8:00 PM  ? Specimen: BLOOD  ?Result Value Ref Range Status  ? Specimen Description BLOOD BLOOD LEFT FOREARM  Final  ? Special Requests   Final  ?  BOTTLES DRAWN AEROBIC AND ANAEROBIC Blood Culture adequate volume  ? Culture   Final  ?  NO GROWTH 3 DAYS ?Performed at [facility],  [place] [address], [place], [place] [address] ?  ? Report Status PENDING  Incomplete  ?Blood culture (routine x 2)     Status: None (Preliminary result)  ? Collection Time: 05/24/21  9:09 PM  ? Specimen: BLOOD  ?Result Value Ref Range Status  ? Specimen Description BLOOD RIGHT ANTECUBITAL  Final  ? Special Requests   Final  ?  BOTTLES DRAWN AEROBIC AND ANAEROBIC Blood Culture adequate volume  ? Culture   Final  ?  NO GROWTH 3 DAYS ?Performed at [facility], [place] [address], [place], [place] [address] ?  ? Report Status PENDING  Incomplete  ?Urine Culture     Status: Abnormal  ? Collection Time: 05/25/21  5:40 AM  ? Specimen: Urine, Clean Catch  ?Result Value Ref Range Status  ? Specimen Description URINE, CLEAN CATCH  Final  ? Special Requests   Final  ?  NONE ?Performed at [facility], [place] [address] [place], [place] [address] ?  ? Culture >=100,000 COLONIES/mL ENTEROCOCCUS FAECALIS (A)  Final  ? Report Status 05/27/2021 FINAL  Final  ? Organism ID, Bacteria ENTEROCOCCUS FAECALIS (A)  Final  ?    Susceptibility  ? Enterococcus faecalis - MIC*  ?  AMPICILLIN <=2 SENSITIVE Sensitive   ?  NITROFURANTOIN <=16 SENSITIVE Sensitive   ?  VANCOMYCIN 1 SENSITIVE Sensitive   ?  * >=100,000 COLONIES/mL ENTEROCOCCUS FAECALIS  ?MRSA Next Gen by PCR, Nasal     Status: None  ? Collection Time: 05/25/21  6:34 PM  ? Specimen: Nasal Mucosa; Nasal Swab  ?Result Value Ref Range Status  ? MRSA by PCR Next Gen NOT DETECTED NOT DETECTED Final  ?  Comment: (NOTE) ?The GeneXpert MRSA Assay (FDA approved for NASAL specimens only), ?is one component of a comprehensive MRSA colonization surveillance ?program. It is not intended to diagnose MRSA infection nor to guide ?or monitor treatment for MRSA infections. ?Test performance is not FDA approved in patients less than 2 years ?old. ?Performed at [facility], [place] [address], [place], [place] ?[address] ?  ? ? ?Impression/Plan:  ?1. HIV/AIDS - he remains on dolutegravir and Descovy  and las viral load more suppressed.  Rechecked this weekend and results pending.  Will also consider changing his medications to be more compatible with tube feeds, depending on goals of care discussions.   ?CD4 < 35.   ? ?2.  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  Will continue.   ? ?3.  OI prophylaxis - on Bactrim and fluconazole prophylaxis.  ? ?4.  Disposition - I had a prolonged discussion with the patients sister, Ricky [name] regarding Ricky [name]'s lab results and prognosis.  She asked about his CD4 count and viral load.  She also talked at length about Ricky [name]'s options and reiterated the differences between palliative care and hospice.  She asked me about [facility] and had a detailed discussion with me of her negative experiences with the nursing home care and hope that she can take [place] home.  She  though acknowledged the work that it takes to care for him and I did discuss that [employer] has a long history of working well with patients like [facility] and encouraged her to consider that as an option.  She indicated she is leaning toward hospice care.   ? ?I have personally spent 50 minutes involved in face-to-face and non-face-to-face activities for this patient on the day of the visit plus an additional 30 minutes including a prolonged conversation with the patients sister. Professional time spent includes the following activities: Preparing to see the patient (review of tests), Obtaining and/or reviewing separately obtained history (admission/discharge record), Performing a medically appropriate examination and/or evaluation , Ordering medications/tests/procedures, referring and communicating with other health care professionals, Documenting clinical information in the EMR, Independently interpreting results (not separately reported), Communicating results to the patient/family/caregiver, Counseling and educating the patient/family/caregiver and Care  coordination (not separately reported).   ? ?  ?

## 2021-05-28 ENCOUNTER — Inpatient Hospital Stay (HOSPITAL_COMMUNITY): Payer: Medicaid Other

## 2021-05-28 DIAGNOSIS — E162 Hypoglycemia, unspecified: Secondary | ICD-10-CM | POA: Diagnosis not present

## 2021-05-28 DIAGNOSIS — N179 Acute kidney failure, unspecified: Secondary | ICD-10-CM | POA: Diagnosis not present

## 2021-05-28 DIAGNOSIS — B20 Human immunodeficiency virus [HIV] disease: Secondary | ICD-10-CM | POA: Diagnosis not present

## 2021-05-28 DIAGNOSIS — J189 Pneumonia, unspecified organism: Secondary | ICD-10-CM | POA: Diagnosis not present

## 2021-05-28 LAB — CBC WITH DIFFERENTIAL/PLATELET
Abs Immature Granulocytes: 0.87 10*3/uL — ABNORMAL HIGH (ref 0.00–0.07)
Basophils Absolute: 0 10*3/uL (ref 0.0–0.1)
Basophils Relative: 1 %
Eosinophils Absolute: 0 10*3/uL (ref 0.0–0.5)
Eosinophils Relative: 0 %
HCT: 29.1 % — ABNORMAL LOW (ref 39.0–52.0)
Hemoglobin: 9.8 g/dL — ABNORMAL LOW (ref 13.0–17.0)
Immature Granulocytes: 17 %
Lymphocytes Relative: 4 %
Lymphs Abs: 0.2 10*3/uL — ABNORMAL LOW (ref 0.7–4.0)
MCH: 31.1 pg (ref 26.0–34.0)
MCHC: 33.7 g/dL (ref 30.0–36.0)
MCV: 92.4 fL (ref 80.0–100.0)
Monocytes Absolute: 0.4 10*3/uL (ref 0.1–1.0)
Monocytes Relative: 7 %
Neutro Abs: 3.7 10*3/uL (ref 1.7–7.7)
Neutrophils Relative %: 71 %
Platelets: 80 10*3/uL — ABNORMAL LOW (ref 150–400)
RBC: 3.15 MIL/uL — ABNORMAL LOW (ref 4.22–5.81)
RDW: 22.3 % — ABNORMAL HIGH (ref 11.5–15.5)
Smear Review: DECREASED
WBC: 5.2 10*3/uL (ref 4.0–10.5)
nRBC: 1 % — ABNORMAL HIGH (ref 0.0–0.2)

## 2021-05-28 LAB — MAGNESIUM
Magnesium: 1.9 mg/dL (ref 1.7–2.4)
Magnesium: 1.8 mg/dL (ref 1.7–2.4)

## 2021-05-28 LAB — COMPREHENSIVE METABOLIC PANEL
ALT: 50 U/L — ABNORMAL HIGH (ref 0–44)
AST: 62 U/L — ABNORMAL HIGH (ref 15–41)
Albumin: <1.5 g/dL — ABNORMAL LOW (ref 3.5–5.0)
Alkaline Phosphatase: 89 U/L (ref 38–126)
Anion gap: 9 (ref 5–15)
BUN: 29 mg/dL — ABNORMAL HIGH (ref 8–23)
CO2: 17 mmol/L — ABNORMAL LOW (ref 22–32)
Calcium: 8.1 mg/dL — ABNORMAL LOW (ref 8.9–10.3)
Chloride: 109 mmol/L (ref 98–111)
Creatinine, Ser: 1.75 mg/dL — ABNORMAL HIGH (ref 0.61–1.24)
GFR, Estimated: 43 mL/min — ABNORMAL LOW (ref >60)
Glucose, Bld: 273 mg/dL — ABNORMAL HIGH (ref 70–99)
Potassium: 4.5 mmol/L (ref 3.5–5.1)
Sodium: 135 mmol/L (ref 135–145)
Total Bilirubin: 1.3 mg/dL — ABNORMAL HIGH (ref 0.3–1.2)
Total Protein: 5.7 g/dL — ABNORMAL LOW (ref 6.5–8.1)

## 2021-05-28 LAB — GLUCOSE, CAPILLARY
Glucose-Capillary: 228 mg/dL — ABNORMAL HIGH (ref 70–99)
Glucose-Capillary: 230 mg/dL — ABNORMAL HIGH (ref 70–99)
Glucose-Capillary: 241 mg/dL — ABNORMAL HIGH (ref 70–99)
Glucose-Capillary: 247 mg/dL — ABNORMAL HIGH (ref 70–99)
Glucose-Capillary: 252 mg/dL — ABNORMAL HIGH (ref 70–99)
Glucose-Capillary: 263 mg/dL — ABNORMAL HIGH (ref 70–99)

## 2021-05-28 LAB — LEGIONELLA PNEUMOPHILA SEROGP 1 UR AG: L. pneumophila Serogp 1 Ur Ag: NEGATIVE

## 2021-05-28 LAB — PHOSPHORUS
Phosphorus: 4.6 mg/dL (ref 2.5–4.6)
Phosphorus: 4.3 mg/dL (ref 2.5–4.6)

## 2021-05-28 IMAGING — DX DG ABD PORTABLE 1V
1 series · 2 of 2 positions shown · non-contrast
Comparison: Previous studies including radiograph done on
[DATE]

CLINICAL DATA: Vomiting

EXAM:
PORTABLE ABDOMEN - 1 VIEW

[Series 1: abdomen · 0.14mm/px · 2 of 2 slices shown]
[im 1/2]
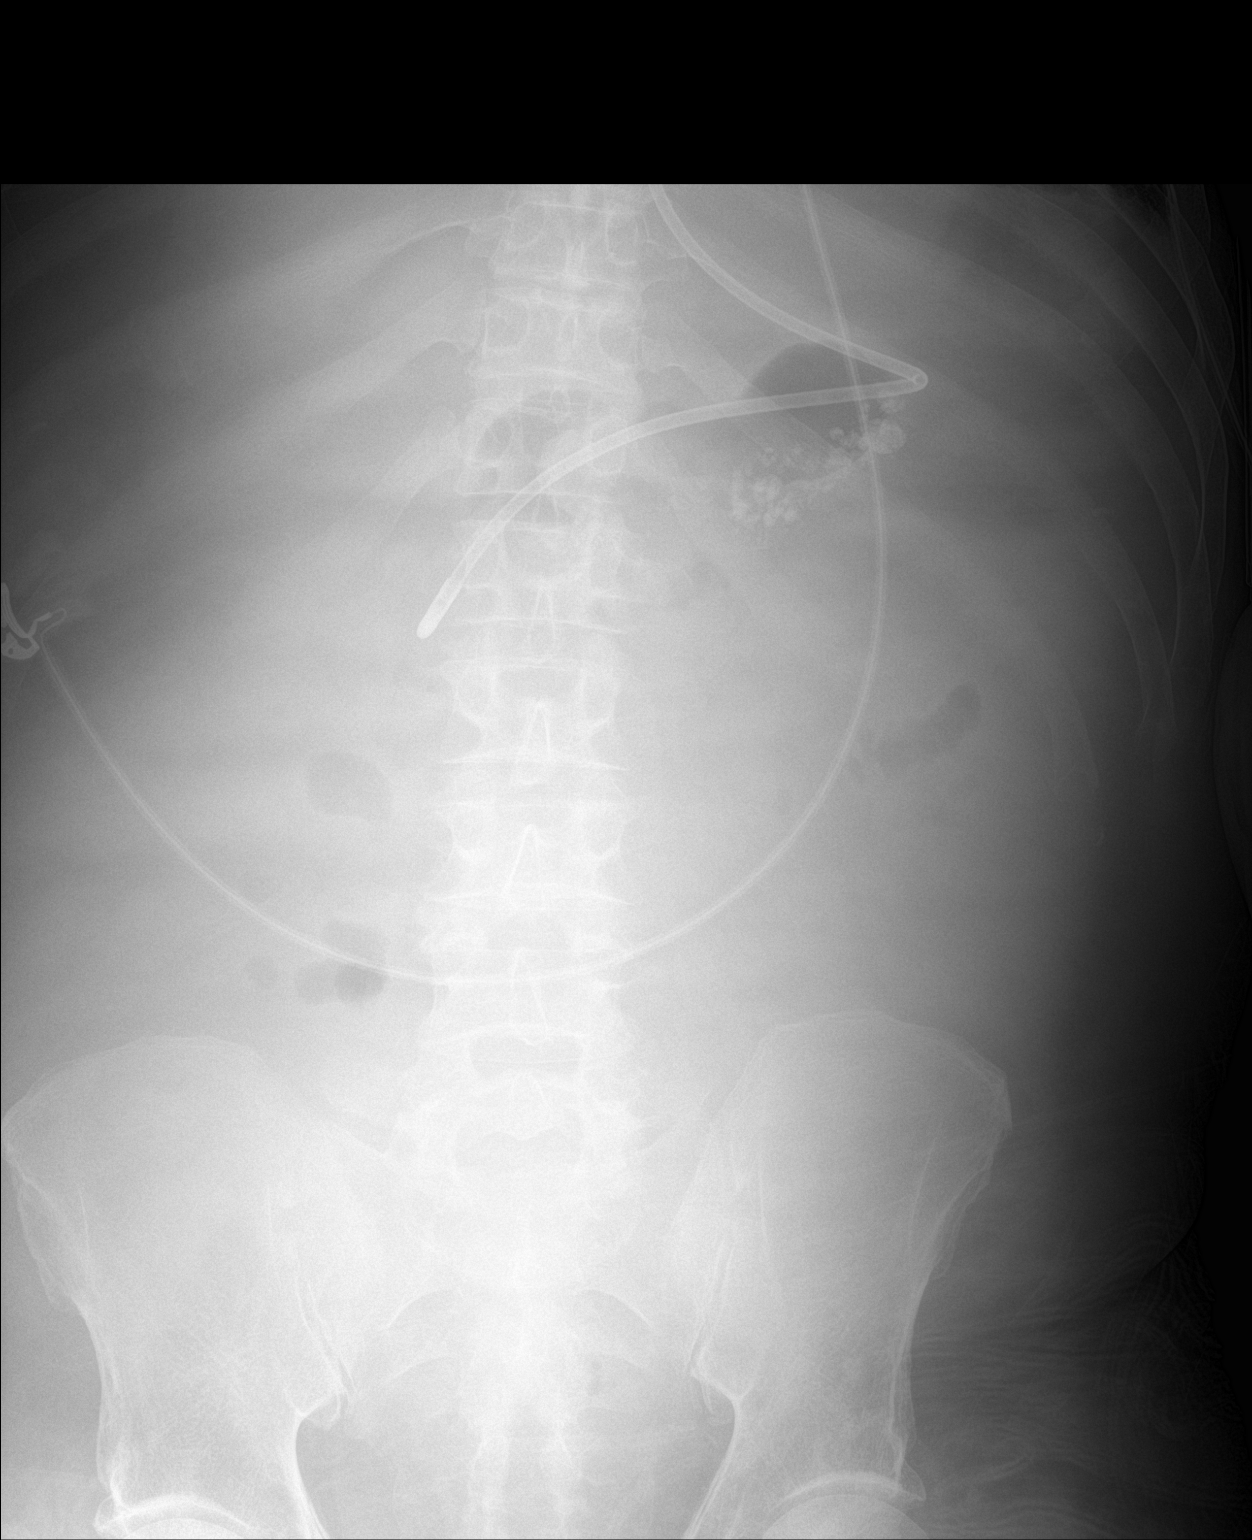
[im 2/2]
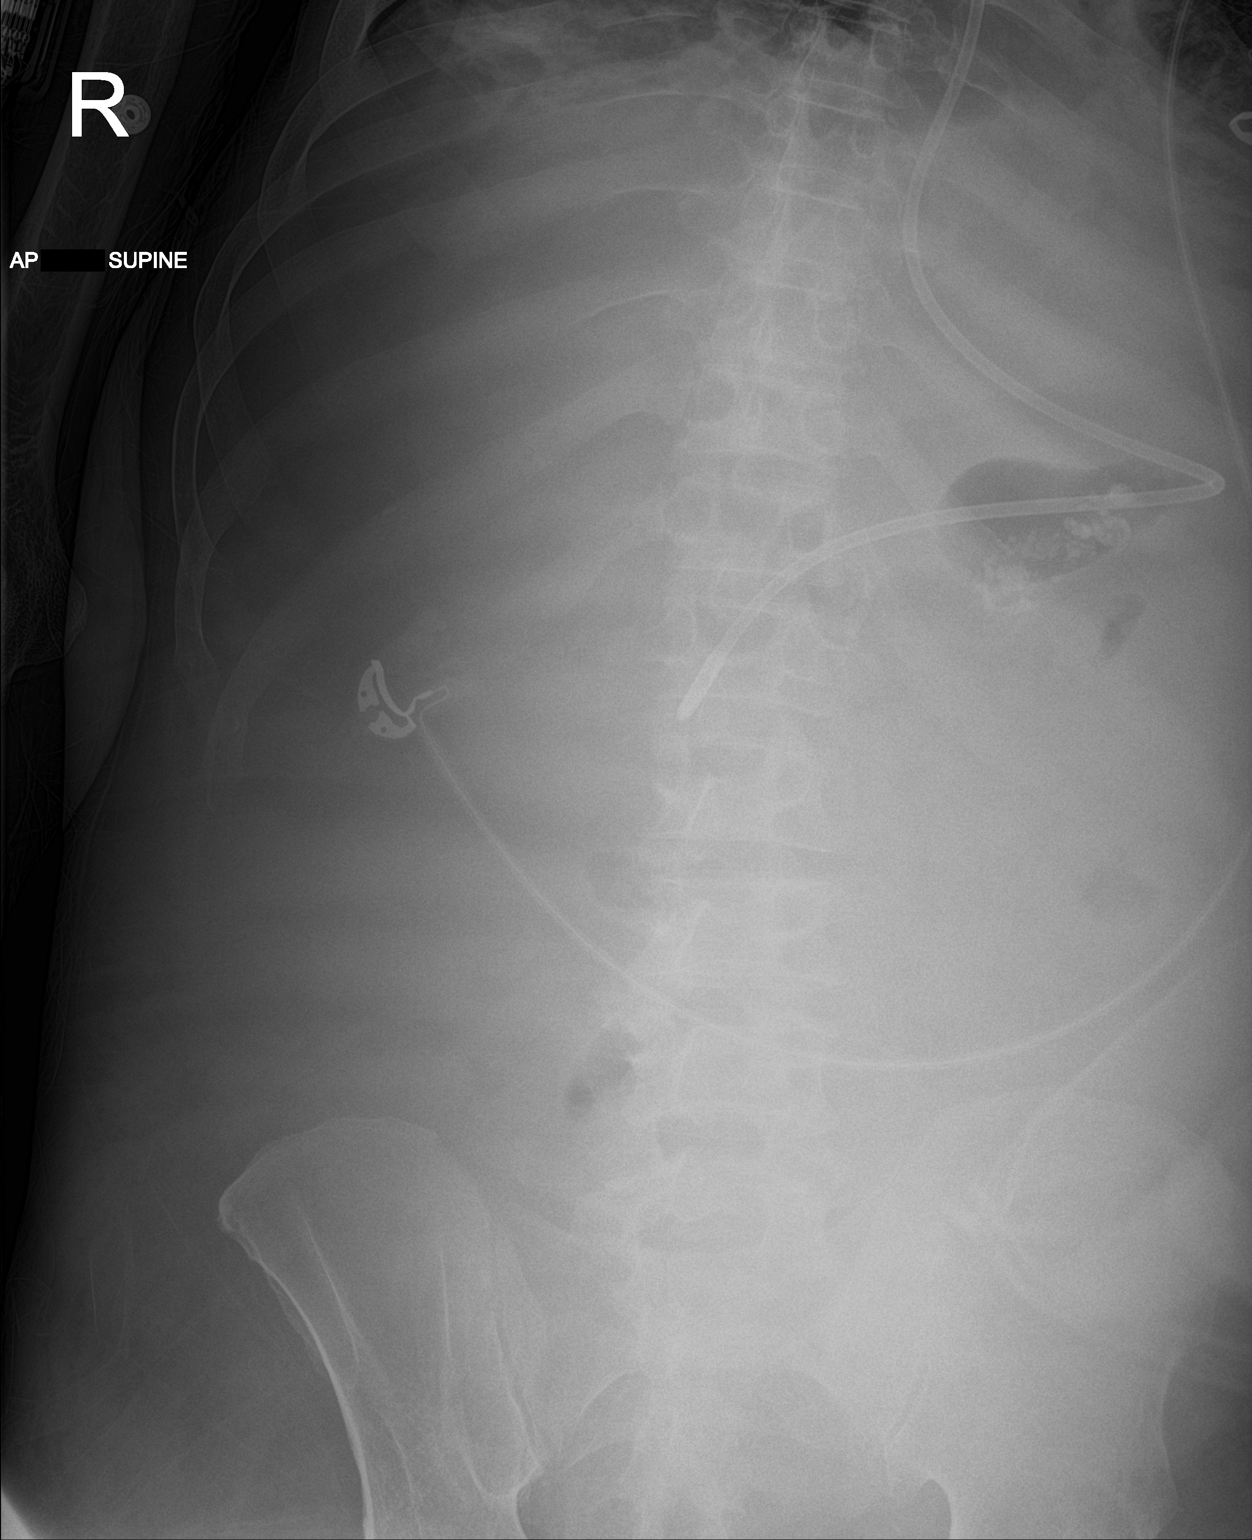

[2 of 2 positions shown; findings below may reference images not displayed]

FINDINGS: Bowel gas pattern is nonspecific. There is diffuse haziness in the
abdomen suggesting ascites. Tip enteric tube is seen in the region
of pylorus or proximal duodenum with no interval change in position.
There are coarse calcifications in the epigastrium possibly
suggesting calcific pancreatitis. Overall, no significant interval
changes are noted.
IMPRESSION: Nonspecific bowel gas pattern. Possible large ascites. Chronic
calcific pancreatitis.

## 2021-05-28 MED ORDER — MEDIHONEY WOUND/BURN DRESSING EX PSTE
1.0000 | PASTE | Freq: Every day | CUTANEOUS | Status: DC
Start: 2021-05-28 — End: 2021-06-01
  Administered 2021-05-28 – 2021-05-31 (×4): 1 via TOPICAL
  Filled 2021-05-28: qty 44

## 2021-05-28 MED ORDER — FREE WATER
200.0000 mL | Status: DC
  Administered 2021-05-28 (×2): 200 mL

## 2021-05-28 MED ORDER — INSULIN ASPART 100 UNIT/ML IJ SOLN
0.0000 [IU] | Freq: Three times a day (TID) | INTRAMUSCULAR | Status: DC
  Administered 2021-05-29: 2 [IU] via SUBCUTANEOUS
  Administered 2021-05-30 (×3): 1 [IU] via SUBCUTANEOUS

## 2021-05-28 MED ORDER — ORAL CARE MOUTH RINSE
15.0000 mL | Freq: Two times a day (BID) | OROMUCOSAL | Status: DC
  Administered 2021-05-28 – 2021-05-31 (×6): 15 mL via OROMUCOSAL

## 2021-05-28 MED ORDER — VITAL 1.5 CAL PO LIQD
1000.0000 mL | ORAL | Status: DC
  Administered 2021-05-28 – 2021-05-29 (×4): 1000 mL
  Filled 2021-05-28 (×5): qty 1000

## 2021-05-28 NOTE — Consult Note (Addendum)
WOC Nurse Consult Note: ?Reason for Consult: Consult requested for sacrum; Pt is in isolation for Covid.  ?Wound type: Sacrum with chronic Unstageable pressure injury, noted as present on admission on 4/21, according to the bedside nursing flow sheet. Pt is emaciated with multiple systemic factors which can impair healing. He is frequently incontinent of loose stools and it is difficult to keep the location from becoming soiled, related to the close proximity to the rectum. ?Pressure Injury POA: Yes ?Measurement: 3X3cm, 100% tightly adhered yellow slough, small amt tan drainage, no odor or fluctuance ?Dressing procedure/placement/frequency: Topical treatment orders provided for bedside nurses to perform as follows to provide enzymatic debridement of nonviable tissue: Apply Medihoney to the sacrum wound Q day, then cover with foam dressing.  (Change foam dressing Q 3 days or PRN soiling). ?Please re-consult if further assistance is needed.  Thank-you,  ?Julien Girt MSN, RN, [place], [place], CNS ?[phone]  ?

## 2021-05-28 NOTE — Progress Notes (Signed)
Speech Language Pathology Treatment: Dysphagia  ?Patient Details ?Name: Ricky [name] ?MRN: [id number] ?DOB: 1955/04/11 ?Today's Date: 05/28/2021 ?Time: 8891-6945 ?SLP Time Calculation (min) (ACUTE ONLY): 39 min ? ?Assessment / Plan / Recommendation ?Clinical Impression ? Pt was seen for f/u swallowing therapy. Note that he had an episode of emesis this am with TF on hold but cleared with RN before attempting minimal POs. Oral care was performed with toothbrush and toothpaste, followed by SLP administering liquid wash with oral suction to rinse his mouth due to current level of oral control. Initially, pt kept his mouth open, not closing his dentition or lips to presentation of spoon/straw. Modifications were made per sister's request, such as switching from cold water to room temperature water. She also took the cup and tried to make the pt drink, although he started to push the cup away when she did this. Pt did ultimately did get very little amounts of water up through the straw, followed by oral holding requiring cues to swallow, and throat clearing with delayed coughing. Pt does not seem appropriate at this time for a PO diet given current presentation including mentation and deconditioning. Would focus primarily on increasing and then maintaining oral hygiene. Discussed with RN that single-use swabs with water could be used immediately following oral care to provide moisture. Pt and sister were educated on recommendations. ?  ?HPI HPI: Pt is a 66 y.o. male who presented from SNF where he was noted to be poorly responsive and found to have CBG in the 30s. Acute metabolic encephalopathy suspected. CXR: 4/23: Left retrocardiac opacities may indicate atelectasis or developing consolidation. PMH: HIV/AIDS with recent CD4 <35, disseminated MAC, history of cryptococcal meningitis, chronic COVID, dysphagia, debility, severe malnutrition, and abnormal lymphadenopathy, GERD. Pt seen by SLP 4/10 due to "gagging episode  while taking pills"; BSE WNL, Rx: regular diet with thin liquids, pills whole in puree. SLP reconsulted 4/14 due to pt grimacing while taking pills; delayed cough noted with 1/7 sips of water; facial grimacing noted but pt denied odynophagia. ?  ?   ?SLP Plan ? Continue with current plan of care ? ?  ?  ?Recommendations for follow up therapy are one component of a multi-disciplinary discharge planning process, led by the attending physician.  Recommendations may be updated based on patient status, additional functional criteria and insurance authorization. ?  ? ?Recommendations  ?Diet recommendations: NPO ?Medication Administration: Via alternative means  ?   ?    ?   ? ? ? ? Oral Care Recommendations: Oral care QID ?Follow Up Recommendations: Skilled nursing-short term rehab (<3 hours/day) ?Assistance recommended at discharge: Frequent or constant Supervision/Assistance ?SLP Visit Diagnosis: Dysphagia, unspecified (R13.10) ?Plan: Continue with current plan of care ? ? ? ? ?  ?  ? ? ?Osie Bond., M.A. CCC-SLP ?Acute Rehabilitation Services ?Office [phone] ? ?Secure chat preferred ? ? ?05/28/2021, 3:14 PM ?

## 2021-05-28 NOTE — Plan of Care (Signed)
Problem: Education: Goal: Knowledge of General Education information will improve Description Including pain rating scale, medication(s)/side effects and non-pharmacologic comfort measures Outcome: Progressing   Problem: Health Behavior/Discharge Planning: Goal: Ability to manage health-related needs will improve Outcome: Progressing   Problem: Clinical Measurements: Goal: Will remain free from infection Outcome: Progressing   Problem: Activity: Goal: Risk for activity intolerance will decrease Outcome: Progressing   Problem: Nutrition: Goal: Adequate nutrition will be maintained Outcome: Progressing   Problem: Coping: Goal: Level of anxiety will decrease Outcome: Progressing   Problem: Elimination: Goal: Will not experience complications related to urinary retention Outcome: Progressing   Problem: Pain Managment: Goal: General experience of comfort will improve Outcome: Progressing   Problem: Safety: Goal: Ability to remain free from injury will improve Outcome: Progressing   Problem: Skin Integrity: Goal: Risk for impaired skin integrity will decrease Outcome: Progressing   

## 2021-05-28 NOTE — Progress Notes (Signed)
Pt  had a bile colored emesis episode. Tube feeding stopped. On call MD is aware. Asked for a chest xray. Pt is stable at this time. Nausea meds are contraindicated due to QTC being elevated.  ?

## 2021-05-28 NOTE — Progress Notes (Signed)
? ? ? [facility] ?                                                                            ? ? ?Ricky [name], is a 66 y.o. male, DOB - 04-04-1955, ZDG:[id number] ?Admit date - 05/24/2021    ?Outpatient Primary MD for the patient is Pcp, No ? ?LOS - 4  days ? ? ? ?Brief summary  ? ?Ricky [name] is a pleasant 66 y.o. male with medical history significant for HIV/AIDS with recent CD4 <35, disseminated MAC, history of cryptococcal meningitis, chronic COVID, dysphagia, debility, severe malnutrition, and abdominal lymphadenopathy, now presenting from his SNF where he was noted to be poorly responsive and found to have CBG in the 34s.  Patient received IV dextrose prior to the arrival and his CBGs have improved.  Chest x-ray shows retrocardiac opacity suspicious for pneumonia. He was started on IV antibiotics and ID consulted in view of his low CD4 counts and extensive MAI disease and on multiple antibiotics. He was also found to have moderate to severe ascites and severely malnourished with hypoalbuminemia. Cortrak was placed and tube feeds started, but he couldn't tolerate them and vomited overnight, held the tube feeds for 8 hours and restarted them slowly after discussing with Dietician. He is scheduled for IR guided paracentesis and should receive albumin post paracentesis.  ? ? ?In view of his deconditioning and multiple medical issues and poor progression, palliative is on board and discussion ongoing.  ? ? ?Assessment & Plan  ? ? ?Assessment and Plan: ? ?Acute metabolic encephalopathy probably secondary to a combination of hypoglycemia, pneumonia,, mild dementia. ?-pts  mental status remains the same currently not following commands.  ?-Unfortunately his A1c is around 3.9, probably secondary to poor oral intake. ?-Urine cultures show 1,00,000 enterococcus fecalis sensitive to ampicillin, currently on Unasyn.  ?Blood cultures are negative so far.  ? ? ?Sepsis/ hypothermia/ hypotensive/ tachypnea/  tachycardia/ AKI/ AMS present on admission ?Acute respiratory failure with hypoxia requiring 2 lit of [place] oxygen.  ?Left retrocardiac opacity on chest x-ray currently treating as pneumonia./ COVID pneumonia ?Patient currently hypoxic requiring about 2 lit of [place] oxygen.  ?Patient started on IV Rocephin and Zithromax., transitioned to Unasyn for anaerobic coverage.  ?Nasal cannula oxygen to keep sats greater than 90%, currently requiring about 2 lit/min.  ?Follow up blood cultures.  ?Urine for strep pneumonia is negative, urine for Legionella antigen is pending. ?He  is also on IV decadron and IV remdesevir for COVID as per ID recommendations.  ?CT chest without contrast done and results reviewed.  ? ? ?Acute kidney injury probably with mild metabolic acidosis secondary to poor oral intake, dehydration, prerenal azotemia. ?Patient's baseline creatinine around 1 admitted with a creatinine of 1.58, had  worsened to 1.63 to 1.68 to 1.59 to 1.7 ?Foley catheter placed.  ?CT abdomen and pelvis negative for hydronephrosis . ?IV fluids, renally dose medications and continue to monitor. ? ? ?HIV/AIDS last CD4 count less than 35. ?Patient is on [place] Descovy and Bactrim. ?Continue the same. ID on board.  ?Rpt CD4 count ordered by ID.  ? ? ?History of disseminated MAC infection ?Continue with rifabutin, azithromycin and ethambutol ? ? ?  History of cryptococcal meningitis ?Continue with fluconazole ? ? ?Pancytopenia ?Probably secondary to HIV, infection.  ?S/p 1 unit of prbc transfusion, for hemoglobin of 6.8, improved to 7.6. TO 8.3 to 9.8 ?Thrombocytopenia platelets are around 80,000.  ? ? ?Mediastinal / Extensive retroperitoneal  lymphadenopathy ?Patient has  mediastinal and hilar and extensive retroperitoneal lymphadenopathy. ?Last admission CT surgery consulted for possible biopsy but they felt he is not a candidate for a VATS/excisional mediastinal biopsy.   ?General surgery could not do an excisional biopsy as patient had  multiple comorbidities and would need a laparotomy and patient would not be a candidate for it at this time. ?Lymphoma is still on the differential. ? ? ? ?Left lower lobe lung mass:  ?Patient will need biopsy of the lung mass, recommend outpatient follow up with Dr Valeta Harms.  ? ? ?Chronic COVID-19 infection/ COVID pneumonia:  ?Patient unable to clear initial infection patient. He completed the course of Molnupiravir last week.  ?IV steroids and IV remdesevir ordered .  ? ? ? ?Dysphagia ?SLP evaluation recommending NPO.  ? ? ? ?Anasarca with leg edema ascites probably secondary to hypoalbuminemia. ?Recommend compression stockings and elevation of the legs  ?CT abdomen and pelvis shows large volume ascites, US paracentesis ordered. Small volume paracentesis and albumin to be given after paracentesis.  ?Fluid to be sent for analysis.  ? ? ? ?Severe malnutrition ?Patient is currently on Megace, unable to take any oral meds, not safe due to altered mental status.  ?Ordered cortrak for nutrition and meds.  ? ? ?GERD ?On protonix  ? ? ? ?Elevated liver enzymes ?Improving.  ? ? ?Hyponatremia ?-Mild, continue to monitor. ? ? ?Enterococcus UTI:  ?Sensitive to ampicillin. Currently on UNASYN.  ? ? ? ?Diarrhea: watery. Resolved.  ? ? ? Pressure injury.  ?RN Pressure Injury Documentation: ?Pressure Injury 05/24/21 Sacrum Mid Unstageable - Full thickness tissue loss in which the base of the injury is covered by slough (yellow, tan, gray, green or brown) and/or eschar (tan, brown or black) in the wound bed. (Active)  ?05/24/21 1800  ?Location: Sacrum  ?Location Orientation: Mid  ?Staging: Unstageable - Full thickness tissue loss in which the base of the injury is covered by slough (yellow, tan, gray, green or brown) and/or eschar (tan, brown or black) in the wound bed.  ?Wound Description (Comments):   ?Present on Admission: Yes  ?Dressing Type Foam - Lift dressing to assess site every shift 05/28/21 0727  ? ?Wound care consulted  and recommendations given.  ? ?Interventions: Tube feeding, MVI ? ?Estimated body mass index is 18.5 kg/m? as calculated from the following: ?  Height as of this encounter: _0  (1.854 m). ?  Weight as of this encounter: 63.6 kg. ? ?Code Status: FULL CODE.  ?DVT Prophylaxis:  Place TED hose Start: 05/28/21 1310 ?SCDs Start: 05/24/21 2245 ? ? ?Level of Care: Level of care: Progressive ?Family Communication discussed with sister over the phone today.  ? ?Disposition Plan:     Remains inpatient appropriate:  IV antibiotics.  ? ?Procedures:  ?CT abdomen and pelvis.  ?IR paracentesis_ 4/25 ? ?Consultants:   ?Palliative care ?IR ?PCCM ?ID  ?Wound care  ? ?Antimicrobials:  ? ?Anti-infectives (From admission, onward)  ? ? Start     Dose/Rate Route Frequency Ordered Stop  ? 05/27/21 1045  azithromycin (ZITHROMAX) tablet 500 mg       ? 500 mg Per Tube Daily 05/27/21 0956    ? 05/27/21 1045  dolutegravir (TIVICAY)  tablet 50 mg       ? 50 mg Per Tube Daily 05/27/21 0956    ? 05/27/21 1045  emtricitabine-tenofovir AF (DESCOVY) 200-25 MG per tablet 1 tablet       ? 1 tablet Per Tube Daily 05/27/21 0956    ? 05/27/21 1045  ethambutol (MYAMBUTOL) tablet 1,000 mg       ? 15 mg/kg ? 66.2 kg Per Tube Daily 05/27/21 0956    ? 05/27/21 1045  rifabutin (MYCOBUTIN) capsule 300 mg       ? 300 mg Per Tube Daily 05/27/21 0957    ? 05/27/21 1045  sulfamethoxazole-trimethoprim (BACTRIM) 400-80 MG per tablet 1 tablet       ? 1 tablet Per Tube Daily 05/27/21 0957    ? 05/27/21 1000  remdesivir 100 mg in sodium chloride 0.9 % 100 mL IVPB       ?See Hyperspace for full Linked Orders Report.  ? 100 mg ?200 mL/hr over 30 Minutes Intravenous Daily 05/26/21 1229 05/31/21 0959  ? 05/26/21 1400  remdesivir 200 mg in sodium chloride 0.9% 250 mL IVPB       ?See Hyperspace for full Linked Orders Report.  ? 200 mg ?580 mL/hr over 30 Minutes Intravenous Once 05/26/21 1229 05/27/21 1320  ? 05/26/21 1300  fluconazole (DIFLUCAN) IVPB 200 mg       ? 200 mg ?100  mL/hr over 60 Minutes Intravenous Every 24 hours 05/26/21 1135    ? 05/26/21 1300  Ampicillin-Sulbactam (UNASYN) 3 g in sodium chloride 0.9 % 100 mL IVPB       ? 3 g ?200 mL/hr over 30 Minutes Intravenous

## 2021-05-28 NOTE — Progress Notes (Signed)
Nutrition Follow-up ? ?DOCUMENTATION CODES:  ? ?Severe malnutrition in context of chronic illness ? ?INTERVENTION:  ? ?Recommend resume TF via Cortrak: ?Vital 1.5 at 20 ml/h to provide 720 kcal, 32 gm protein, 367 ml free water daily. Once tolerance established, recommend increase by 10 ml every 12 hours to goal rate of 60 ml/h (1440 ml per day). ?  ?At goal rate, provides 2160 kcal, 97 gm protein, 1100 ml free water daily. ?  ?MVI with minerals via tube once daily. ?  ?Continue to monitor magnesium, potassium, and phosphorus BID for at least 3 days, MD to replete as needed, as pt is at risk for refeeding syndrome given severe malnutrition. ?  ?NUTRITION DIAGNOSIS:  ? 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 ? ?Ongoing  ? ?GOAL:  ? ?Patient will meet greater than or equal to 90% of their needs ? ?Unmet  ? ?MONITOR:  ? ?TF tolerance, Labs, Diet advancement ? ?REASON FOR ASSESSMENT:  ? ?Consult ?Enteral/tube feeding initiation and management ? ?ASSESSMENT:  ? ?66 yo male admitted with hypoglycemia and decreased mentation. 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 ? ?Palliative care team is following patient. Family wants to continue full code and full scope of care. Plans for 48 hour time limited trial of tube feeding. If no improvement, may consider transition to comfort care.  ? ?Tube feeding was started via Cortrak tube yesterday. Patient developed emesis (bile colored) this morning and tube feeding has been on hold since then. Abdominal film taken this morning showed Cortrak tube tip in the region of the pylorus or proximal duodenum with no change in position. Also showed large ascites and chronic calcific pancreatitis. Will change to a  semi-elemental TF formula for potential improvement in tolerance. Discussed with MD.  ? ?Labs and medications reviewed. ?Potassium, phosphorus, and magnesium were all WNL this morning.  ? ?Diet Order:   ?Diet Order   ? ?       ?  Diet NPO time specified  Diet effective now       ?  ? ?  ?  ? ?  ? ? ?EDUCATION NEEDS:  ? ?Not appropriate for education at this time ? ?Skin:  Skin Assessment: Skin Integrity Issues: ?Skin Integrity Issues:: Stage II ?Stage II: sacrum ? ?Last BM:  4/25 type 7 ? ?Height:  ? ?Ht Readings from Last 1 Encounters:  ?05/24/21 '6\' 1"'$  (1.854 m)  ? ? ?Weight:  ? ?Wt Readings from Last 1 Encounters:  ?05/24/21 63.6 kg  ? ? ?BMI:  Body mass index is 18.5 kg/m?. ? ?Estimated Nutritional Needs:  ? ?Kcal:  2000-2200 ? ?Protein:  100-120 gm ? ?Fluid:  1.8-2 L ? ? ? ?Lucas Mallow RD, LDN, CNSC ?Please refer to Amion for contact information.                                                       ? ?

## 2021-05-28 NOTE — Progress Notes (Signed)
[facility]) Hospital Liaison note: ?  ?This is a pending outpatient-based Palliative Care patient. Will continue to follow for disposition. ?  ?Please call with any outpatient palliative questions or concerns. ?  ?Thank you, ?Lorelee Market, LPN ?[facility] Hospital Liaison ?[phone] ?

## 2021-05-29 ENCOUNTER — Inpatient Hospital Stay (HOSPITAL_COMMUNITY): Payer: Medicaid Other

## 2021-05-29 DIAGNOSIS — N179 Acute kidney failure, unspecified: Secondary | ICD-10-CM | POA: Diagnosis not present

## 2021-05-29 DIAGNOSIS — E162 Hypoglycemia, unspecified: Secondary | ICD-10-CM | POA: Diagnosis not present

## 2021-05-29 DIAGNOSIS — L8990 Pressure ulcer of unspecified site, unspecified stage: Secondary | ICD-10-CM | POA: Insufficient documentation

## 2021-05-29 DIAGNOSIS — J189 Pneumonia, unspecified organism: Secondary | ICD-10-CM | POA: Diagnosis not present

## 2021-05-29 DIAGNOSIS — B20 Human immunodeficiency virus [HIV] disease: Secondary | ICD-10-CM | POA: Diagnosis not present

## 2021-05-29 DIAGNOSIS — Z789 Other specified health status: Secondary | ICD-10-CM

## 2021-05-29 HISTORY — PX: IR PARACENTESIS: IMG2679

## 2021-05-29 LAB — GLUCOSE, CAPILLARY
Glucose-Capillary: 263 mg/dL — ABNORMAL HIGH (ref 70–99)
Glucose-Capillary: 261 mg/dL — ABNORMAL HIGH (ref 70–99)
Glucose-Capillary: 184 mg/dL — ABNORMAL HIGH (ref 70–99)
Glucose-Capillary: 180 mg/dL — ABNORMAL HIGH (ref 70–99)
Glucose-Capillary: 217 mg/dL — ABNORMAL HIGH (ref 70–99)

## 2021-05-29 LAB — CULTURE, BLOOD (ROUTINE X 2)
Culture: NO GROWTH
Culture: NO GROWTH
Special Requests: ADEQUATE
Special Requests: ADEQUATE

## 2021-05-29 LAB — GLUCOSE, PLEURAL OR PERITONEAL FLUID: Glucose, Fluid: 284 mg/dL

## 2021-05-29 LAB — BODY FLUID CELL COUNT WITH DIFFERENTIAL
Eos, Fluid: 0 %
Lymphs, Fluid: 25 %
Monocyte-Macrophage-Serous Fluid: 69 % (ref 50–90)
Neutrophil Count, Fluid: 6 % (ref 0–25)
Total Nucleated Cell Count, Fluid: 17 cu mm (ref 0–1000)

## 2021-05-29 LAB — GRAM STAIN

## 2021-05-29 LAB — LACTATE DEHYDROGENASE, PLEURAL OR PERITONEAL FLUID: LD, Fluid: 35 U/L — ABNORMAL HIGH (ref 3–23)

## 2021-05-29 LAB — MAGNESIUM: Magnesium: 2 mg/dL (ref 1.7–2.4)

## 2021-05-29 LAB — ALBUMIN, PLEURAL OR PERITONEAL FLUID: Albumin, Fluid: <1.5 g/dL

## 2021-05-29 LAB — PHOSPHORUS: Phosphorus: 3.3 mg/dL (ref 2.5–4.6)

## 2021-05-29 IMAGING — US IR PARACENTESIS
1 series · 4 of 4 positions shown · non-contrast
Comparison: none

INDICATION: HIV/AIDS, severe protein calorie malnutrition, and acute renal
failure with ascites. Request for diagnostic and therapeutic
paracentesis.

[Series 1: ir (person_name)/(person_name) · 4 of 4 slices shown]
[im 1/4]
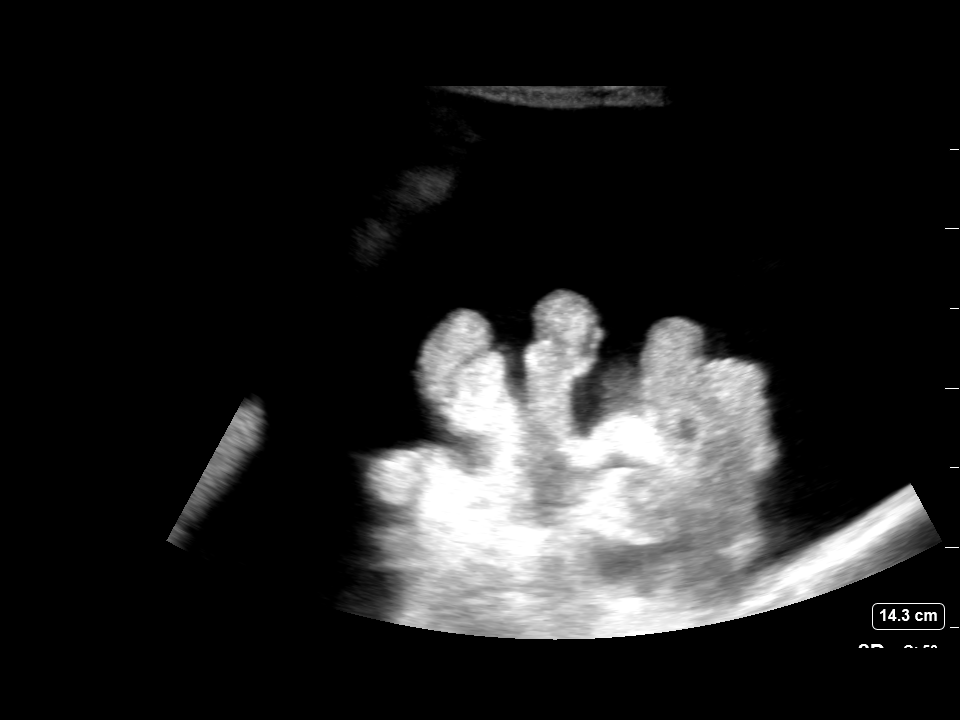
[im 2/4]
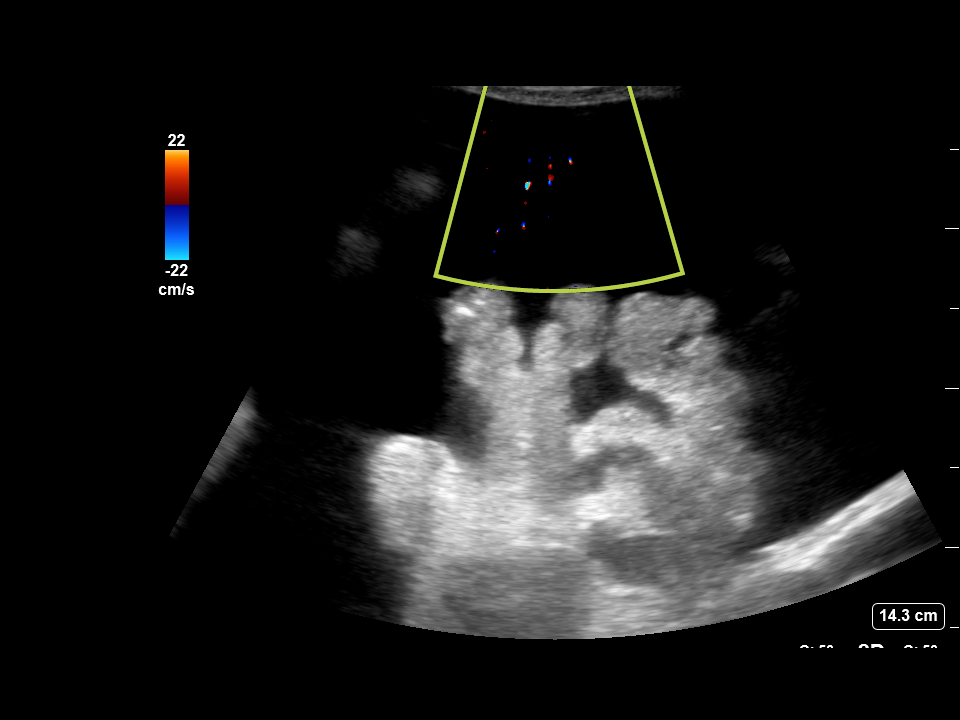
[im 3/4]
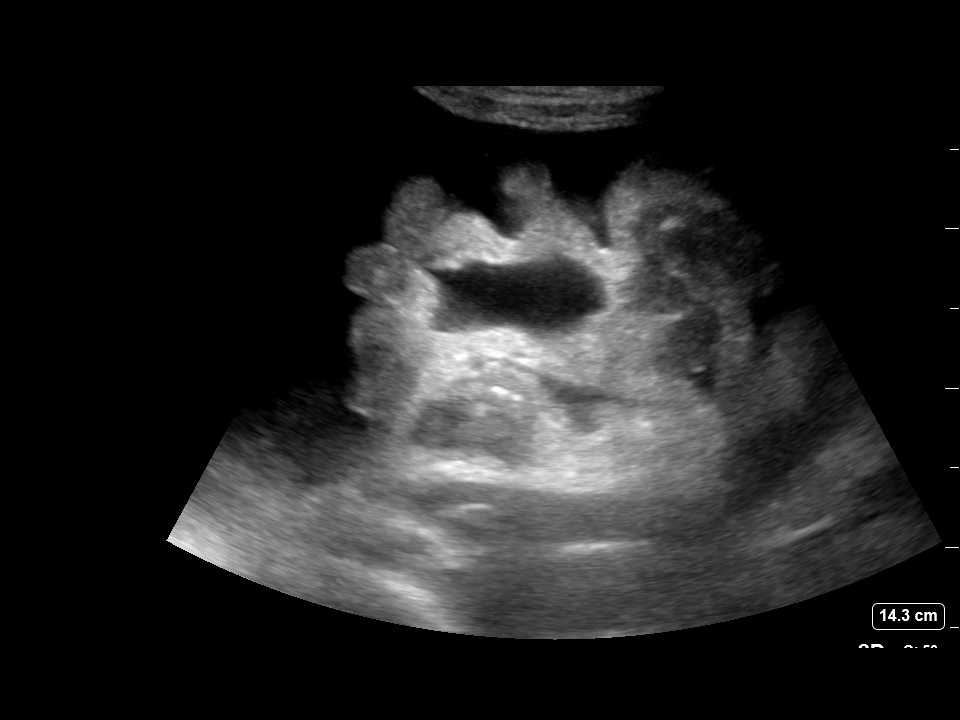
[im 4/4]
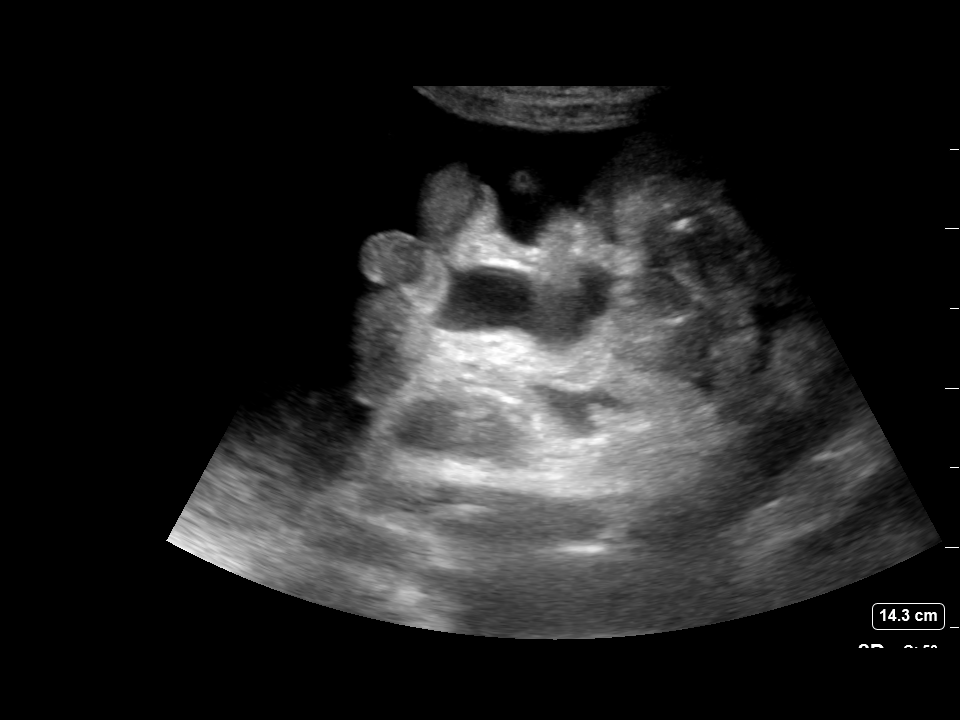

[4 of 4 positions shown; findings below may reference images not displayed]

EXAM:
ULTRASOUND GUIDED PARACENTESIS

MEDICATIONS:
1% lidocaine 8 mL

COMPLICATIONS:
None immediate.

PROCEDURE:
Informed written consent was obtained from the patient after a
discussion of the risks, benefits and alternatives to treatment. A
timeout was performed prior to the initiation of the procedure.

Initial ultrasound scanning demonstrates a large amount of ascites
within the right lower abdominal quadrant. The right lower abdomen
was prepped and draped in the usual sterile fashion. 1% lidocaine
was used for local anesthesia.

Following this, a 19 gauge, 7-cm, Yueh catheter was introduced. An
ultrasound image was saved for documentation purposes. The
paracentesis was performed. The catheter was removed and a dressing
was applied. The patient tolerated the procedure well without
immediate post procedural complication.
FINDINGS: A total of approximately 4.9 L of clear yellow fluid was removed.
Samples were sent to the laboratory as requested by the clinical
team.
IMPRESSION: Successful ultrasound-guided paracentesis yielding 4.9 liters of
peritoneal fluid.

## 2021-05-29 MED ORDER — LIDOCAINE HCL (PF) 1 % IJ SOLN
INTRAMUSCULAR | Status: DC | PRN
Start: 2021-05-29 — End: 2021-06-01
  Administered 2021-05-29: 10 mL

## 2021-05-29 MED ORDER — SODIUM CHLORIDE 0.9 % IV SOLN: INTRAVENOUS | Status: DC

## 2021-05-29 MED ORDER — LIDOCAINE HCL 1 % IJ SOLN
INTRAMUSCULAR | Status: AC
  Filled 2021-05-29: qty 20

## 2021-05-29 MED ORDER — PANTOPRAZOLE 2 MG/ML SUSPENSION
40.0000 mg | Freq: Every day | ORAL | Status: DC
  Administered 2021-05-29 – 2021-05-31 (×3): 40 mg
  Filled 2021-05-29 (×4): qty 20

## 2021-05-29 MED ORDER — SODIUM CHLORIDE 0.9 % IV SOLN
3.0000 g | Freq: Three times a day (TID) | INTRAVENOUS | Status: DC
  Administered 2021-05-29 – 2021-05-31 (×7): 3 g via INTRAVENOUS
  Filled 2021-05-29 (×7): qty 8

## 2021-05-29 MED ORDER — FREE WATER
100.0000 mL | Status: DC
  Administered 2021-05-29 – 2021-05-31 (×10): 100 mL

## 2021-05-29 NOTE — Procedures (Signed)
PROCEDURE SUMMARY: ? ?Successful US guided paracentesis from right lateral abdomen.  ?Yielded 4.9 liters of clear yellow fluid.  ?No immediate complications.  ?Patient tolerated well.  ?EBL = trace ? ?Specimen was sent for labs. ? ?Murrell Redden PA-C ?05/29/2021 ?4:32 PM ? ? ? ?

## 2021-05-29 NOTE — Progress Notes (Signed)
SLP Cancellation Note ? ?Patient Details ?Name: ALEXANDRU MOORER ?MRN: [id number] ?DOB: [birth year] ? ? ?Cancelled treatment:       Reason Eval/Treat Not Completed: Patient at procedure or test/unavailable ? ? ?Lois Slagel, Katherene Ponto ?05/29/2021, 10:43 AM ?

## 2021-05-29 NOTE — Progress Notes (Signed)
Pt has had low urine output for the past couple of hours (54m), NP DOlena Hecklemade aware. Maintenance fluid ordered. ?

## 2021-05-29 NOTE — Progress Notes (Signed)
?                                                                                                                                                     ?                                                   ?Daily Progress Note  ? ?Patient Name: Ricky [name]       Date: 05/29/2021 ?DOB: [birth year]  Age: 66 y.o. MRN#: [id number] ?Attending Physician: [place] Eric J, DO ?Primary Care Physician: Pcp, No ?Admit Date: 05/24/2021 ? ?Reason for Consultation/Follow-up: Establishing goals of care ? ?Subjective: ?Chart review performed - noted 4/25 AM emesis episode after initiating tube feeds on 4/24, which resulted in tube feeds being stopped. Received report from primary RN -no acute concerns.  RN reports tube feeds were restarted 4/25 evening which resulted in 2 more episodes of vomiting overnight. ? ?Went to visit patient at bedside - patient not present for evaluation due to having gone for paracentesis. ? ?Called brother/Ralph and brother/Cliff via conference call.  Attempted to also include sister/[name] with no answer.  Emotional support provided to patient's brothers.  Reviewed interval history since last PMT visit on Monday 4/24.  Reviewed that, unfortunately, patient is not tolerating tube feeds as evidenced by multiple emesis episodes. Gentle education provided to family that despite medical interventions patient has not shown significant improvement and in fact, has continued to decline. Education provided on high risk for recurrent vomiting/discomfort and aspiration/infection if family decided to continue tube feeds.  Natural trajectory at end-of-life reviewed. Goals and philosophy of comfort/hospice care again reviewed.  Medical recommendation given to stop tube feeds and transition to full comfort/hospice.  Reviewed that no matter which path is chosen (aggressive versus comfort) patient's prognosis remains poor.   ? ?Brothers would appreciate time and space to consider options and discuss information is given to  them today.  They do understand options of home versus residential hospice if they decide to focus on patient's comfort. They would also like to discuss information with their sister.  Family will be arriving to hospital later this evening likely around 4:30 PM to see patient and discuss as a family.  PMT will follow-up tomorrow for decisions. ? ?At this time family would like to continue current medical treatment, including tube feeds. ? ?All questions and concerns addressed. Encouraged to call with questions and/or concerns. PMT card previously provided. ? ?Length of Stay: 5 ? ?Current Medications: ?Scheduled Meds:  ? azithromycin  500 mg Per Tube Daily  ? Chlorhexidine Gluconate Cloth  6 each Topical Daily  ? dexamethasone (DECADRON) injection  6 mg Intravenous Daily  ?  dolutegravir  50 mg Per Tube Daily  ? emtricitabine-tenofovir AF  1 tablet Per Tube Daily  ? ethambutol  15 mg/kg Per Tube Daily  ? famotidine  20 mg Per Tube BID  ? feeding supplement (VITAL 1.5 CAL)  1,000 mL Per Tube Q24H  ? free water  200 mL Per Tube Q4H  ? insulin aspart  0-4 Units Subcutaneous TID WC  ? leptospermum manuka honey  1 application. Topical Daily  ? mouth rinse  15 mL Mouth Rinse BID  ? pantoprazole sodium  40 mg Per Tube Daily  ? rifabutin  300 mg Per Tube Daily  ? sodium chloride flush  3 mL Intravenous Q12H  ? sulfamethoxazole-trimethoprim  1 tablet Per Tube Daily  ? ? ?Continuous Infusions: ? sodium chloride 75 mL/hr at 05/29/21 0600  ? ampicillin-sulbactam (UNASYN) IV    ? fluconazole (DIFLUCAN) IV 200 mg (05/28/21 1400)  ? remdesivir 100 mg in NS 100 mL 100 mg (05/28/21 1055)  ? ? ?PRN Meds: ?acetaminophen **OR** acetaminophen, alum & mag hydroxide-simeth, guaiFENesin-dextromethorphan, nitroGLYCERIN, senna-docusate, traMADol ? ?Physical Exam        -unable to perform patient in procedure. ? ?Vital Signs: BP (!) 136/98 (BP Location: Left Arm)   Pulse 86   Temp (!) 93.7 ?F (34.3 ?C) (Rectal)   Resp 15   Ht _0  (1.854  m)   Wt 77.4 kg   SpO2 100%   BMI 22.51 kg/m?  ?SpO2: SpO2: 100 % ?O2 Device: O2 Device: Nasal Cannula ?O2 Flow Rate: O2 Flow Rate (L/min): 2 L/min ? ?Intake/output summary:  ?Intake/Output Summary (Last 24 hours) at 05/29/2021 0840 ?Last data filed at 05/29/2021 0600 ?Gross per 24 hour  ?Intake 2193.95 ml  ?Output 500 ml  ?Net 1693.95 ml  ? ?LBM: Last BM Date : 05/27/21 ?Baseline Weight: Weight: 63.6 kg ?Most recent weight: Weight: 77.4 kg ? ?     ?Palliative Assessment/Data: PPS 30% on tube feeds ? ? ? ? ? ?Patient Active Problem List  ? Diagnosis Date Noted  ? Goals of care, counseling/discussion   ? Non-diabetic hypoglycemia 05/24/2021  ? Pancytopenia ([place]) 05/24/2021  ? Thrombocytopenia ([place]) 05/15/2021  ? Hyponatremia 05/10/2021  ? GERD without esophagitis 05/10/2021  ? Protein-calorie malnutrition, severe 05/10/2021  ? 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 Bladder wall thickening 04/08/2021  ? Abdominal pain 04/08/2021  ? Normocytic anemia 04/08/2021  ? Nausea with vomiting 04/08/2021  ? Fever   ? Acute cholecystitis 01/22/2021  ? Complicated UTI (urinary tract infection) 01/22/2021  ? Noncompliance 01/22/2021  ? Lymphadenopathy   ? Chronic low back pain   ? HIV infection ([place])   ? Community acquired pneumonia   ? Nonadherence to medical treatment   ? COVID-19 virus infection 10/13/2020  ? Alcohol abuse, daily use   ? COVID-19 10/12/2020  ? 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 Blurry vision, left eye   ? AKI (acute kidney injury) ([place])   ? Cavitary lesion of lung   ? Acute nonintractable headache   ? Smoking   ? Cocaine use   ? Paroxysmal SVT (supraventricular tachycardia) ([facility])   ? Abnormal EKG   ? Atrial flutter ([place]) 03/27/2020  ? Aortic atherosclerosis ([place]) 03/27/2020  ? Hypertension 03/27/2020  ? Cryptococcal meningitis ([place]) 03/26/2020  ? Leukocytopenia 03/26/2020  ? Macrocytosis 03/26/2020  ? Moderate protein malnutrition ([place]) 03/26/2020  ? Hypoalbuminemia 03/26/2020   ? AIDS (acquired immune deficiency syndrome) ([place]) 03/26/2020  ? ? ?Palliative Care Assessment & Plan  ? ?  Patient Profile: ?66 y.o. male  with past medical history of  HIV/AIDS with recent CD4 <35, disseminated MAC, history of cryptococcal meningitis, chronic COVID, dysphagia, debility, severe malnutrition, and abnormal lymphadenopathy admitted on 05/24/2021 with hypoglycemia and poor responsiveness at SNF. ?  ?Patient was recently discharged to SNF on 05/21/21 and is very cachectic, frail, now with AKI and acute encephalopathy. PMT has been consulted to assist with goals of care conversation.  ? ?Assessment: ?Principal Problem: ?  Non-diabetic hypoglycemia ?Active Problems: ?  AIDS (acquired immune deficiency syndrome) ([place]) ?  AKI (acute kidney injury) ([place]) ?  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  COVID-19 ?  Community acquired pneumonia ?  Lymphadenopathy ?  Protein-calorie malnutrition, severe ?  Pancytopenia ([place]) ?  Concern about end of life ? ?Recommendations/Plan: ?Continue full code/full scope ?Family considering transition to full comfort/hospice care -will be meeting this evening as a family for decision making ?PMT will follow-up tomorrow 4/27 for continued goals of care ?PMT will continue to follow and support holistically ? ?Goals of Care and Additional Recommendations: ?Limitations on Scope of Treatment: Full Scope Treatment ? ?Code Status: ? ?  ?Code Status Orders  ?(From admission, onward)  ?  ? ? ?  ? ?  Start     Ordered  ? 05/24/21 2245  Full code  Continuous       ? 05/24/21 2246  ? ?  ?  ? ?  ? ?Code Status History   ? ? Date Active Date Inactive Code Status Order ID Comments User Context  ? 05/09/2021 2237 05/22/2021 0412 Full Code [id number]  Christel Mormon, MD Inpatient  ? 01/22/2021 0145 01/25/2021 0027 Full Code [id number]  Chotiner, Yevonne Aline, MD ED  ? 10/12/2020 2323 10/26/2020 2240 Full Code [id number]  Lurline Del, DO ED  ? 03/26/2020 1948 04/25/2020 1639 Full  Code [id number]  Reubin Milan, MD ED  ? ?  ? ? ?Prognosis: ? Poor ? ?Discharge Planning: ?To Be Determined ? ?Care plan was discussed with primary RN, patient's brothers, Dr. [place] TOC ? ?Thank you fo

## 2021-05-29 NOTE — Progress Notes (Signed)
?PROGRESS NOTE ? ? ? ?Ricky [name]  SAY:[id number] DOB: 01/06/1956 DOA: 05/24/2021 ?PCP: Pcp, No  ? ? ?Brief Narrative:  ? ?REA RESER is a a 66 year old male with past medical history significant for HIV/AIDS (CD4 <35), disseminated MAC, history of cryptococcal meningitis, chronic COVID, dysphagia, debility, severe protein calorie malnutrition, abdominal lymphadenopathy who presented from SNF after being found poorly responsive and found to have a blood glucose in the 30s.  Patient received IV dextrose in route via EMS with improvement of glucose.  Chest x-ray on admission shows retrocardiac opacity suspicious for pneumonia and was started on IV antibiotics.  Infectious disease was consulted due to extensive MAI disease and on multiple antibiotics.  Patient was also found to have severe ascites and severely malnourished with hypoalbuminemia.  Core track feeding tube was placed and was initiated on tube feeds with poor toleration.  Patient underwent IR guided paracentesis on 4/26.  Given his continued decline with poor response to aggressive therapy and in toleration of tube feeds, palliative care was consulted for assistance with goals of care and medical decision making. ? ?Assessment & Plan: ?  ?Acute metabolic encephalopathy ?CT head without contrast with no acute intracranial malady, generalized cerebral atrophy with chronic white matter small vessel ischemic disease, moderate/marked severity pansinus disease.  Etiology likely multifactorial with combination of hypoglycemia, pneumonia, mild dementia and chronic comorbidities such as HIV/AIDS, chronic MAC infection and malnourishment.  Additionally, urine culture positive for Enterococcus faecalis and continues on antibiotics.  Patient continues to be poorly responsive and likely hospice candidate. ?--Palliative care following, given poor toleration to tube feeds and continued decline anticipate likely need of home hospice versus residential  hospice ?--Continue antibiotics for now ? ?Sepsis, POA ?Acute respiratory failure with hypoxia, POA ?Enterococcus UTI ?Patient presenting with confusion, decreased responsiveness and found to have a left retrocardiac opacity on chest x-ray concerning for pneumonia.  CT chest without contrast left lower lobe cavitary nodule with additional soft tissue density nodules extending anteriorly and posteriorly is now replaced by more confluent and larger oval soft tissue density spanning the region of the cavitary nodule, left upper lobe 12 mm and 15 mm nodules appear unchanged, grossly stable mediastinal hilar lymphadenopathy, interval decrease suggesting necrosis of the right aspect of the subcarinal lymph node, small left greater than right pleural effusions, bronchiectasis posterior left upper lobe and bilateral medial lower lobes.  Strep pneumonia urinary antigen negative.  Initially started on azithromycin and ceftriaxone, transitioned to Unasyn for anaerobic coverage. ?--Continue Unasyn 3g IV q8h ? ?COVID-19 viral pneumonia ?Initial COVID-19 positivity 10/12/2020, remains positive.  Likely secondary to immunosuppressed state given HIV/AIDS status.  Seen by ID with recommendation of IV steroids and remdesivir. ?--Remdesivir, DAY #4/5 ?--Decadron 6 mg IV every 24 hours, plan 10-day course ?--Supportive care, maintain SPO2 greater than 92% ?--Continue contact/airborne isolation ? ?Acute renal failure ?Metabolic acidosis ?Etiology likely secondary to prerenal azotemia in the setting of poor oral intake/dehydration.  Baseline creatinine around 1.0.  Creatinine on admission 1.58.  CT abdomen/pelvis with no hydronephrosis seen. ?--Cr 1.58>>1.63>1.68>1.75 ?--Not tolerating tube feeds, now stopped ?--Restart IV fluids with NS at 75 mL/h ? ?HIV/AIDS ?--Descovy and TIVICAY dailt ?--Bactrim daily for PCP prophylaxis ? ?History of cryptococcal meningitis ?--Fluconazole ? ?History of disseminated MAC infection ?--Rifabutin,  azithromycin, ethambutol ? ?Mediastinal, extensive retroperitoneal lymphadenopathy ?During last admission, CT surgery consulted for possible biopsy but felt he is not a candidate for VATS/excisional mediastinal biopsy.  General surgery cannot perform an excisional biopsy as patient  had multiple comorbidities and would lead and laparotomy and not a great candidate at this time.  Concern for lymphoma on differential.  Overall poor prognosis with multiple comorbidities and adult failure to thrive. ? ?Left lower lung mass ?Patient would benefit from biopsy of lung mass, but likely unable to tolerate. ? ?Dysphagia ?Evaluated by SLP, recommend NPO.  Core track placed and currently not tolerating tube feeds with multiple vomiting episodes.  Patient very poorly responsive and overall poor prognosis and likely benefit from hospice/comfort care at this time.  Palliative care following.  Family meeting to determine next plan of course. ? 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 ?Hyponatremia ?Etiology likely secondary to poor oral intake with associated anasarca, adult failure to thrive.  Attempted tube feeds but unable to tolerate. ?--Continue IV fluid hydration ? ?GERD: Protonix daily ? ?Pressure injury ?Pressure Injury 05/24/21 Sacrum Mid Unstageable - Full thickness tissue loss in which the base of the injury is covered by slough (yellow, tan, gray, green or brown) and/or eschar (tan, brown or black) in the wound bed. (Active)  ?05/24/21 1800  ?Location: Sacrum  ?Location Orientation: Mid  ?Staging: Unstageable - Full thickness tissue loss in which the base of the injury is covered by slough (yellow, tan,  gray, green or brown) and/or eschar (tan, brown or black) in the wound bed.  ?Wound Description (Comments):   ?Present on Admission: Yes  ?   ?Pressure Injury 05/29/21 Shoulder Left;Posterior Stage 2 -  Partial thickness loss of dermis presenting as a shallow open injury with a red, pink wound bed without slough. (Active)  ?05/29/21 0445  ?Location: Shoulder  ?Location Orientation: Left;Posterior  ?Staging: Stage 2 -  Partial thickness loss of dermis presenting as a shallow open injury with a red, pink wound bed without slough.  ?Wound Description (Comments):   ?Present on Admission: No  ?-- Wound care consulted, continue offloading, local wound care ? ? ?DVT prophylaxis: Place TED hose Start: 05/28/21 1310 ?SCDs Start: 05/24/21 2245 ? ?  Code Status: Full Code ?Family Communication:  ? ?Disposition Plan:  ?Level of care: Progressive ?Status is: Inpatient ?Remains inpatient appropriate because: IV fluid hydration, further discussions needed with family/palliative care, anticipate home with hospice versus residential hospice needs ?  ? ?Consultants:  ?Infectious disease ?PCCM ?Palliative care ? ?Procedures:  ?None ? ?Antimicrobials:  ?Azithromycin 4/22, 4/24>> ?Ceftriaxone 4/21 - 4/22 ?Unasyn 4/23>> ?Remdesivir 4/23>> ?Fluconazole ?Ethambutol ?Bactrim ? ? ?Subjective: ?Patient seen examined bedside, resting comfortably.  Poorly interactive.  Sister-in-law present.  Seen by palliative care this morning with likely recommendation of transitioning to comfort measures; but family still wants to talk later this evening regarding this.  Continues to not tolerate tube feeds with multiple episodes of emesis overnight.  Overall very poor/grim prognosis with continued decline.  Unable to obtain any further ROS from patient due to his current mental status.  No other acute concerns overnight per nursing staff. ? ?Objective: ?Vitals:  ? 05/29/21 0523 05/29/21 0817 05/29/21 0900 05/29/21 1245  ?BP: (!) 128/100 (!) 136/98 (!)  127/97 (!) 116/92  ?Pulse: 86     ?Resp: 15     ?Temp:      ?TempSrc:      ?SpO2: 100%     ?Weight:      ?  Height:      ? ? ?Intake/Output Summary (Last 24 hours) at 05/29/2021 1412 ?Last data filed at 05/29/2021 0600 ?Gross per 24

## 2021-05-30 DIAGNOSIS — E162 Hypoglycemia, unspecified: Secondary | ICD-10-CM | POA: Diagnosis not present

## 2021-05-30 LAB — CBC
HCT: 27.7 % — ABNORMAL LOW (ref 39.0–52.0)
Hemoglobin: 8.8 g/dL — ABNORMAL LOW (ref 13.0–17.0)
MCH: 29.4 pg (ref 26.0–34.0)
MCHC: 31.8 g/dL (ref 30.0–36.0)
MCV: 92.6 fL (ref 80.0–100.0)
Platelets: 48 10*3/uL — ABNORMAL LOW (ref 150–400)
RBC: 2.99 MIL/uL — ABNORMAL LOW (ref 4.22–5.81)
RDW: 22.1 % — ABNORMAL HIGH (ref 11.5–15.5)
WBC: 2.7 10*3/uL — ABNORMAL LOW (ref 4.0–10.5)
nRBC: 0.8 % — ABNORMAL HIGH (ref 0.0–0.2)

## 2021-05-30 LAB — GLUCOSE, CAPILLARY
Glucose-Capillary: 184 mg/dL — ABNORMAL HIGH (ref 70–99)
Glucose-Capillary: 194 mg/dL — ABNORMAL HIGH (ref 70–99)
Glucose-Capillary: 195 mg/dL — ABNORMAL HIGH (ref 70–99)
Glucose-Capillary: 199 mg/dL — ABNORMAL HIGH (ref 70–99)
Glucose-Capillary: 207 mg/dL — ABNORMAL HIGH (ref 70–99)
Glucose-Capillary: 216 mg/dL — ABNORMAL HIGH (ref 70–99)
Glucose-Capillary: 219 mg/dL — ABNORMAL HIGH (ref 70–99)

## 2021-05-30 LAB — COMPREHENSIVE METABOLIC PANEL
ALT: 46 U/L — ABNORMAL HIGH (ref 0–44)
AST: 45 U/L — ABNORMAL HIGH (ref 15–41)
Albumin: <1.5 g/dL — ABNORMAL LOW (ref 3.5–5.0)
Alkaline Phosphatase: 128 U/L — ABNORMAL HIGH (ref 38–126)
Anion gap: 4 — ABNORMAL LOW (ref 5–15)
BUN: 33 mg/dL — ABNORMAL HIGH (ref 8–23)
CO2: 17 mmol/L — ABNORMAL LOW (ref 22–32)
Calcium: 8.1 mg/dL — ABNORMAL LOW (ref 8.9–10.3)
Chloride: 117 mmol/L — ABNORMAL HIGH (ref 98–111)
Creatinine, Ser: 1.38 mg/dL — ABNORMAL HIGH (ref 0.61–1.24)
GFR, Estimated: 57 mL/min — ABNORMAL LOW (ref >60)
Glucose, Bld: 234 mg/dL — ABNORMAL HIGH (ref 70–99)
Potassium: 3.5 mmol/L (ref 3.5–5.1)
Sodium: 138 mmol/L (ref 135–145)
Total Bilirubin: 1 mg/dL (ref 0.3–1.2)
Total Protein: 5.2 g/dL — ABNORMAL LOW (ref 6.5–8.1)

## 2021-05-30 LAB — MAGNESIUM: Magnesium: 1.8 mg/dL (ref 1.7–2.4)

## 2021-05-30 LAB — CYTOLOGY - NON PAP

## 2021-05-30 MED ORDER — MORPHINE SULFATE (PF) 2 MG/ML IV SOLN
1.0000 mg | INTRAVENOUS | Status: DC | PRN
  Administered 2021-05-31: 1 mg via INTRAVENOUS
  Filled 2021-05-30: qty 1

## 2021-05-30 NOTE — Progress Notes (Signed)
Speech Language Pathology Treatment: Dysphagia  ?Patient Details ?Name: Ricky [name] ?MRN: [id number] ?DOB: 06-Jun-1955 ?Today's Date: 05/30/2021 ?Time: 5027-7412 ?SLP Time Calculation (min) (ACUTE ONLY): 14 min ? ?Assessment / Plan / Recommendation ?Clinical Impression ? Pt is awake this morning but not consistently following commands. SLP modified environment and positioning to maximize alertness and safety. Oral care was provided with use of toothbrush and although dentition looked a little improved compared to last visit, it is in poor condition likely at baseline and it is difficult to visualize further into his oral cavity because he does not open his mouth further to command. Oral care was provided as thoroughly as able. When offered small amounts of water (room temperature per sister's request last visit) via spoon, pt still demonstrates minimal attempts at acceptance. He did get small amounts of water, accepted passively via spoon, with anterior loss and oral holding noted. SLP used yankauer to remove the remaining water from his mouth when swallowing was not felt to palpation. Continue to offer pt additional trials of different consistencies, flavors, and delivery methods, but he shook his head "no" and would turn from presentation. When asked if he was hungry or if he was thirsty, he replied no. Would continue to remain NPO with focus on oral care pending further discussions re: [place] amongst family. Risk for aspiration as well as risk for adverse events related to dysphagia remains high.  ?  ?HPI HPI: Pt is a 66 y.o. male who presented from SNF where he was noted to be poorly responsive and found to have CBG in the 30s. Acute metabolic encephalopathy suspected. CXR: 4/23: Left retrocardiac opacities may indicate atelectasis or developing consolidation. PMH: HIV/AIDS with recent CD4 <35, disseminated MAC, history of cryptococcal meningitis, chronic COVID, dysphagia, debility, severe malnutrition, and  abnormal lymphadenopathy, GERD. Pt seen by SLP 4/10 due to "gagging episode while taking pills"; BSE WNL, Rx: regular diet with thin liquids, pills whole in puree. SLP reconsulted 4/14 due to pt grimacing while taking pills; delayed cough noted with 1/7 sips of water; facial grimacing noted but pt denied odynophagia. ?  ?   ?SLP Plan ? Continue with current plan of care ? ?  ?  ?Recommendations for follow up therapy are one component of a multi-disciplinary discharge planning process, led by the attending physician.  Recommendations may be updated based on patient status, additional functional criteria and insurance authorization. ?  ? ?Recommendations  ?Diet recommendations: NPO ?Medication Administration: Via alternative means  ?   ?    ?   ? ? ? ? Oral Care Recommendations: Oral care QID ?Follow Up Recommendations: Skilled nursing-short term rehab (<3 hours/day) ?Assistance recommended at discharge: Frequent or constant Supervision/Assistance ?SLP Visit Diagnosis: Dysphagia, unspecified (R13.10) ?Plan: Continue with current plan of care ? ? ? ? ?  ?  ? ? ?Osie Bond., M.A. CCC-SLP ?Acute Rehabilitation Services ?Office [phone] ? ?Secure chat preferred ? ? ?05/30/2021, 9:22 AM ?

## 2021-05-30 NOTE — TOC Initial Note (Signed)
Transition of Care (TOC) - Initial/Assessment Note  ? ? ?Patient Details  ?Name: Ricky [name] ?MRN: [id number] ?Date of Birth: October 01, 1955 ? ?Transition of Care [facility]) CM/SW Contact:    ?Joanne Chars, LCSW ?Phone Number: ?05/30/2021, 2:40 PM ? ?Clinical Narrative:     CSW informed by palliative team that family has decided to move forward with home hospice.  CSW spoke with pt sister [name] outside of pt room, choice of hospice agencies discussed, sister had spoken to [facility] about a month ago, would like to speak with them again. Sister asked about guardianship, CSW discussed this with her that this is likely not necessary at this time.  Sister also talked about her decision with DNR and stating she is still not feeling good about DNR at this time. ? ?CSW LM on [facility] referral phone.            ? ? ?Expected Discharge Plan: [place] ?Barriers to Discharge: No Barriers Identified ? ? ?Patient Goals and CMS Choice ?  ?  ?Choice offered to / list presented to : Sibling (sister [name]) ? ?Expected Discharge Plan and Services ?Expected Discharge Plan: [place] ?In-house Referral: Clinical Social Work ?  ?Post Acute Care Choice: Hospice ?Living arrangements for the past 2 months: Apartment ?                ?  ?  ?  ?  ?  ?  ?  ?  ?  ?  ? ?Prior Living Arrangements/Services ?Living arrangements for the past 2 months: Apartment ?Lives with:: Siblings (sister [name]) ?  ?       ?Need for Family Participation in Patient Care: Yes (Comment) ?Care giver support system in place?: Yes (comment) ?Current home services: Other (comment) (none) ?Criminal Activity/Legal Involvement Pertinent to Current Situation/Hospitalization: No - Comment as needed ? ?Activities of Daily Living ?Home Assistive Devices/Equipment: Gilford Rile (specify type), Bedside commode/3-in-1 ?ADL Screening (condition at time of admission) ?Patient's cognitive ability adequate to safely complete daily activities?: No ?Is the  patient deaf or have difficulty hearing?: No ?Does the patient have difficulty seeing, even when wearing glasses/contacts?: No ?Does the patient have difficulty concentrating, remembering, or making decisions?: Yes ?Patient able to express need for assistance with ADLs?: Yes ?Does the patient have difficulty dressing or bathing?: Yes ?Independently performs ADLs?: No ?Communication: Independent ?Dressing (OT): Dependent ?Is this a change from baseline?: Pre-admission baseline ?Grooming: Dependent ?Is this a change from baseline?: Pre-admission baseline ?Feeding: Dependent ?Is this a change from baseline?: Pre-admission baseline ?Bathing: Dependent ?Is this a change from baseline?: Pre-admission baseline ?Toileting: Dependent ?Is this a change from baseline?: Pre-admission baseline ?In/Out Bed: Dependent ?Is this a change from baseline?: Pre-admission baseline ?Walks in Home: Dependent ?Is this a change from baseline?: Pre-admission baseline ?Does the patient have difficulty walking or climbing stairs?: Yes ?Weakness of Legs: Both ?Weakness of Arms/Hands: Both ? ?Permission Sought/Granted ?  ?  ?   ?   ?   ?   ? ?Emotional Assessment ?Appearance:: Appears stated age ?Attitude/Demeanor/Rapport: Unable to Assess ?Affect (typically observed): Unable to Assess ?Orientation: : Oriented to Self ?  ?  ? ?Admission diagnosis:  Hypoglycemia [E16.2] ?AKI (acute kidney injury) ([place]) [N17.9] ?Non-diabetic hypoglycemia [E16.2] ?Patient Active Problem List  ? Diagnosis Date Noted  ? Pressure injury of skin 05/29/2021  ? Goals of care, counseling/discussion   ? Non-diabetic hypoglycemia 05/24/2021  ? Pancytopenia ([place]) 05/24/2021  ? Thrombocytopenia ([place]) 05/15/2021  ?  Hyponatremia 05/10/2021  ? GERD without esophagitis 05/10/2021  ? Protein-calorie malnutrition, severe 05/10/2021  ? 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 Bladder wall thickening 04/08/2021  ? Abdominal pain 04/08/2021  ? Normocytic anemia 04/08/2021  ? Nausea with  vomiting 04/08/2021  ? Fever   ? Acute cholecystitis 01/22/2021  ? Complicated UTI (urinary tract infection) 01/22/2021  ? Noncompliance 01/22/2021  ? Lymphadenopathy   ? Chronic low back pain   ? HIV infection ([place])   ? Community acquired pneumonia   ? Nonadherence to medical treatment   ? COVID-19 virus infection 10/13/2020  ? Alcohol abuse, daily use   ? COVID-19 10/12/2020  ? 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 Blurry vision, left eye   ? AKI (acute kidney injury) ([place])   ? Cavitary lesion of lung   ? Acute nonintractable headache   ? Smoking   ? Cocaine use   ? Paroxysmal SVT (supraventricular tachycardia) ([facility])   ? Abnormal EKG   ? Atrial flutter ([place]) 03/27/2020  ? Aortic atherosclerosis ([place]) 03/27/2020  ? Hypertension 03/27/2020  ? 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 Leukocytopenia 03/26/2020  ? Macrocytosis 03/26/2020  ? Moderate protein malnutrition ([place]) 03/26/2020  ? Hypoalbuminemia 03/26/2020  ? AIDS (acquired immune deficiency syndrome) ([place]) 03/26/2020  ? ?PCP:  Pcp, No ?Pharmacy:   ?[id number]  - [address]?[place] [address]?Phone: [phone]: [phone]? ?[facility] ?[address]?[place]?Phone: [phone]: [phone]? ?[facility] ?[address]?[place]?Phone: [phone]: [phone] ? ? ? ? ?Social Determinants of Health (SDOH) Interventions ?  ? ?Readmission Risk Interventions ? ?  10/26/2020  ? 12:55 PM 04/25/2020  ? 10:22 AM  ?Readmission Risk Prevention Plan  ?Transportation Screening Complete Complete  ?PCP or Specialist Appt within 5-7 Days Complete   ?PCP or Specialist Appt within 3-5 Days  Complete  ?Home Care Screening Complete   ?[place] Consult  Complete  ?Social Work Consult for [place]/Counseling  Complete   ?Palliative Care Screening  Not Applicable  ?Medication Review (Press photographer  Complete  ? ? ? ?

## 2021-05-30 NOTE — Progress Notes (Signed)
?  Ricky [name] for Infectious Disease ? ? ?Reason for visit: Follow up on HIV/AIDS, disseminated MAI ? ?Interval History: no fever, some hypothermia ?  ? ?Physical Exam: ?Constitutional:  ?Vitals:  ? 05/29/21 2128 05/30/21 0858  ?BP: (!) 123/99 116/89  ?Pulse: (!) 106   ?Resp: 20   ?Temp: (!) 96.3 ?F (35.7 ?C) 97.8 ?F (36.6 ?C)  ?SpO2: 100%   ? patient appears in NAD; cachectic, unresponsive ?Respiratory: normal respiratory effort ?Cardiovascular: RRR ? ? ?Review of Systems: ?Unable to be assessed due to patient factors ? ?Lab Results  ?Component Value Date  ? WBC 2.7 (L) 05/30/2021  ? HGB 8.8 (L) 05/30/2021  ? HCT 27.7 (L) 05/30/2021  ? MCV 92.6 05/30/2021  ? PLT 48 (L) 05/30/2021  ?  ?Lab Results  ?Component Value Date  ? CREATININE 1.38 (H) 05/30/2021  ? BUN 33 (H) 05/30/2021  ? NA 138 05/30/2021  ? K 3.5 05/30/2021  ? CL 117 (H) 05/30/2021  ? CO2 17 (L) 05/30/2021  ?  ?Lab Results  ?Component Value Date  ? ALT 46 (H) 05/30/2021  ? AST 45 (H) 05/30/2021  ? ALKPHOS 128 (H) 05/30/2021  ?  ? ?Microbiology: ?Recent Results (from the past 240 hour(s))  ?Resp Panel by RT-PCR (Flu A&B, Covid) Nasopharyngeal Swab     Status: Abnormal  ? Collection Time: 05/24/21  7:41 PM  ? Specimen: Nasopharyngeal Swab; Nasopharyngeal(NP) swabs in vial transport medium  ?Result Value Ref Range Status  ? SARS Coronavirus 2 by RT PCR POSITIVE (A) NEGATIVE Final  ?  Comment: (NOTE) ?SARS-CoV-2 target nucleic acids are DETECTED. ? ?The SARS-CoV-2 RNA is generally detectable in upper respiratory ?specimens during the acute phase of infection. Positive results are ?indicative of the presence of the identified virus, but do not rule ?out bacterial infection or co-infection with other pathogens not ?detected by the test. Clinical correlation with patient history and ?other diagnostic information is necessary to determine patient ?infection status. The expected result is Negative. ? ?Fact Sheet for  Patients: ?EntrepreneurPulse.com.au ? ?Fact Sheet for Healthcare Providers: ?IncredibleEmployment.be ? ?This test is not yet approved or cleared by the [place] FDA and  ?has been authorized for detection and/or diagnosis of SARS-CoV-2 by ?FDA under an Emergency Use Authorization (EUA).  This EUA will ?remain in effect (meaning this test can be used) for the duration of  ?the COVID-19 declaration under Section 564(b)(1) of the A ct, 21 ?U.S.C. section 360bbb-3(b)(1), unless the authorization is ?terminated or revoked sooner. ? ?  ? Influenza A by PCR NEGATIVE NEGATIVE Final  ? Influenza B by PCR NEGATIVE NEGATIVE Final  ?  Comment: (NOTE) ?The Xpert Xpress SARS-CoV-2/FLU/RSV plus assay is intended as an aid ?in the diagnosis of influenza from Nasopharyngeal swab specimens and ?should not be used as a sole basis for treatment. Nasal washings and ?aspirates are unacceptable for Xpert Xpress SARS-CoV-2/FLU/RSV ?testing. ? ?Fact Sheet for Patients: ?EntrepreneurPulse.com.au ? ?Fact Sheet for Healthcare Providers: ?IncredibleEmployment.be ? ?This test is not yet approved or cleared by the [place] FDA and ?has been authorized for detection and/or diagnosis of SARS-CoV-2 by ?FDA under an Emergency Use Authorization (EUA). This EUA will remain ?in effect (meaning this test can be used) for the duration of the ?COVID-19 declaration under Section 564(b)(1) of the Act, 21 U.S.C. ?section 360bbb-3(b)(1), unless the authorization is terminated or ?revoked. ? ?Performed at [facility], [place] [address] [place], [place] ?[address] ?  ?Blood culture (routine x 2)  Status: None  ? Collection Time: 05/24/21  8:00 PM  ? Specimen: BLOOD  ?Result Value Ref Range Status  ? Specimen Description BLOOD BLOOD LEFT FOREARM  Final  ? Special Requests   Final  ?  BOTTLES DRAWN AEROBIC AND ANAEROBIC Blood Culture adequate volume  ? Culture   Final  ?  NO  GROWTH 5 DAYS ?Performed at [facility], [place] [address], [place], [place] [address] ?  ? Report Status 05/29/2021 FINAL  Final  ?Blood culture (routine x 2)     Status: None  ? Collection Time: 05/24/21  9:09 PM  ? Specimen: BLOOD  ?Result Value Ref Range Status  ? Specimen Description BLOOD RIGHT ANTECUBITAL  Final  ? Special Requests   Final  ?  BOTTLES DRAWN AEROBIC AND ANAEROBIC Blood Culture adequate volume  ? Culture   Final  ?  NO GROWTH 5 DAYS ?Performed at [facility], [place] [address], [place], [place] [address] ?  ? Report Status 05/29/2021 FINAL  Final  ?Urine Culture     Status: Abnormal  ? Collection Time: 05/25/21  5:40 AM  ? Specimen: Urine, Clean Catch  ?Result Value Ref Range Status  ? Specimen Description URINE, CLEAN CATCH  Final  ? Special Requests   Final  ?  NONE ?Performed at [facility], [place] [address] [place], [place] [address] ?  ? Culture >=100,000 COLONIES/mL ENTEROCOCCUS FAECALIS (A)  Final  ? Report Status 05/27/2021 FINAL  Final  ? Organism ID, Bacteria ENTEROCOCCUS FAECALIS (A)  Final  ?    Susceptibility  ? Enterococcus faecalis - MIC*  ?  AMPICILLIN <=2 SENSITIVE Sensitive   ?  NITROFURANTOIN <=16 SENSITIVE Sensitive   ?  VANCOMYCIN 1 SENSITIVE Sensitive   ?  * >=100,000 COLONIES/mL ENTEROCOCCUS FAECALIS  ?MRSA Next Gen by PCR, Nasal     Status: None  ? Collection Time: 05/25/21  6:34 PM  ? Specimen: Nasal Mucosa; Nasal Swab  ?Result Value Ref Range Status  ? MRSA by PCR Next Gen NOT DETECTED NOT DETECTED Final  ?  Comment: (NOTE) ?The GeneXpert MRSA Assay (FDA approved for NASAL specimens only), ?is one component of a comprehensive MRSA colonization surveillance ?program. It is not intended to diagnose MRSA infection nor to guide ?or monitor treatment for MRSA infections. ?Test performance is not FDA approved in patients less than 2 years ?old. ?Performed at [facility], [place] [address], [place], [place] ?[address] ?  ?Gram stain     Status: None  ?  Collection Time: 05/29/21 11:17 AM  ? Specimen: Abdomen; Peritoneal Fluid  ?Result Value Ref Range Status  ? Specimen Description PERITONEAL FLUID  Final  ? Special Requests ABDOMEN  Final  ? Gram Stain   Final  ?  WBC PRESENT,BOTH PMN AND MONONUCLEAR ?NO ORGANISMS SEEN ?CYTOSPIN SMEAR ?Performed at [facility], [place] [address] [place], [place] [address] ?  ? Report Status 05/29/2021 FINAL  Final  ?Culture, body fluid w Gram Stain-bottle     Status: None (Preliminary result)  ? Collection Time: 05/29/21 11:17 AM  ? Specimen: Peritoneal Washings  ?Result Value Ref Range Status  ? Specimen Description PERITONEAL FLUID  Final  ? Special Requests ABDOMEN  Final  ? Culture   Final  ?  NO GROWTH < 24 HOURS ?Performed at [facility], [place] [address] [place],  [address] ?  ? Report Status PENDING  Incomplete  ? ? ?  Impression/Plan:  ?1. HIV/AIDS - he is getting his ARVs and most recent viral load on 4/23 is suppressed, so no issues with his current regimen.   ?Will continue with T[place] and Descovy.   ? ?2.  Malnutrition - only able to get trickle feeds and not at all able to get enough nutrition to support him.  ? ?3.  Disseminated MAI infection - on three drug regimen with azithromycin, ethambutol and rifabutin for treatment.   ? ?4.  COVID - 19 infection - persistent with a low CT value concerning for ongoing, unresolving infection.  Completing Remdesivir today and not likely to add much value to further treatment.  ? ?5.  Hospice - I agree with the need for hospice-centered care as his outcome at this point with or without interventions will not change so I agree that comfort should be the focus.   ? ?At this point, I will sign off.   ? ?  ?

## 2021-05-30 NOTE — Progress Notes (Signed)
?                                                                                                                                                     ?                                                   ?Daily Progress Note  ? ?Patient Name: Ricky [name]       Date: 05/30/2021 ?DOB: Jan 30, 1956  Age: 66 y.o. MRN#: [id number] ?Attending Physician: [place] Eric J, DO ?Primary Care Physician: Pcp, No ?Admit Date: 05/24/2021 ? ?Reason for Consultation/Follow-up: Establishing goals of care ? ?Subjective: ?AM: Medical records reviewed. Patient assessed at the bedside. Discussed with RN Carly. ? ?Called patient's brother Ricky [name] to follow up on family discussion and assist with decision-making for possible transition to hospice/comfort care. Ricky [name] shares that his sister Ricky [name] is adamant about taking patient home with hospice, other siblings have reservations about home as the best location for hospice care but agree to try. Provided reassurance and discussed option to transfer to hospice facility if it becomes necessary after discharging home with hospice. We discussed option of comfort care while patient is admitted and he shares that family did not discuss in much detail. He states "whatever the doctors think is best. If we did all we can do and he is still getting worse, if there is nothing else they can do then that's that." I shared my recommendation for comfort care given that this unfortunately seems to be the case. He defers final decision to the care team and I offered to call his sister to gather additional family input on care options. ? ?I then called patient's sister Ricky [name] who comfirms decision for home with hospice. We discussed the referral process and hospice resources available for support including equipment, medications, personnel. She is nervous, as she has never cared for someone in hospice care before. She is also anxious about not having cardiac monitoring, IV lines, antibiotics to support patient  with. Reviewed course of events since admission and counseled that patient has been declining despite all of these interventions. Counseled on hospice philosophy at length and provided education on research-based evidence indicating that these interventions do not provide comfort in patients nearing end of life, but rather could contribute to suffering. Reviewed signs and symptoms of distress at end of life, emphasizing 24/7 help line for RN support and additional options including ED or hospice facility for comfort medications if symptoms are uncontrolled. Emphasized that any interventions would be aimed at easing symptom burden and increasing comfort, not curative (antibiotics for UTIs, antifungals for itching, etc.).  ? ?Patient's sister and I then discussed code  status again given goal for home with hospice. Recommended consideration of DNR status, understanding evidenced-based poor outcomes in similar hospitalized patients, as the cause of the arrest is likely associated with chronic/terminal disease rather than a reversible acute cardio-pulmonary event. She is having a hard time with this, wondering why he can't be resuscitated "at least once maybe two times." Counseled on the purpose of hospice as support for patients approaching a peaceful and natural death, which is aligned with DNR. She wishes to come in person to the hospital to discuss this further. ? ?PM: Met at the bedside to continue discussing goals of care with patient's sister. We again discussed code status and this PA practiced reflective listening, provided emotional support while she shared her religious beliefs and the impact of her beliefs on her preference for full code. She feels that a DNR order would be giving up and she understands that some patients can live on hospice for several years. She envisions that cardiopulmonary resuscitation would allow patient to live longer and comfortably while on hospice. I gently informed her that Ricky [name]  unfortunately has a poor prognosis of weeks at best that would make him eligible for residential hospice, certainly not years. Counseled that CPR/intubation would likely cause more suffering than benefit, taking away patient's right to a peaceful death and not contributing to his overall health. She does not want her family to blame her for making a bad decision and feels uncomfortable with updating code status. We discussed that this would need to be in place prior to discharge home with hospice. Ricky [name] states we can't keep having him suffering in the process of waiting for these antibiotics to work." She is agreeable to proceeding with referral to hospice today and will discuss code status further with family.    ? ? ?Questions and concerns addressed. Encouraged to call with questions and/or concerns. PMT will continue to support holistically.  ? ? ?Length of Stay: 6 ? ?Current Medications: ?Scheduled Meds:  ? azithromycin  500 mg Per Tube Daily  ? Chlorhexidine Gluconate Cloth  6 each Topical Daily  ? dexamethasone (DECADRON) injection  6 mg Intravenous Daily  ? dolutegravir  50 mg Per Tube Daily  ? emtricitabine-tenofovir AF  1 tablet Per Tube Daily  ? ethambutol  15 mg/kg Per Tube Daily  ? famotidine  20 mg Per Tube BID  ? feeding supplement (VITAL 1.5 CAL)  1,000 mL Per Tube Q24H  ? free water  100 mL Per Tube Q4H  ? insulin aspart  0-4 Units Subcutaneous TID WC  ? leptospermum manuka honey  1 application. Topical Daily  ? mouth rinse  15 mL Mouth Rinse BID  ? pantoprazole sodium  40 mg Per Tube Daily  ? rifabutin  300 mg Per Tube Daily  ? sodium chloride flush  3 mL Intravenous Q12H  ? sulfamethoxazole-trimethoprim  1 tablet Per Tube Daily  ? ? ?Continuous Infusions: ? sodium chloride Stopped (05/30/21 0456)  ? ampicillin-sulbactam (UNASYN) IV 3 g (05/30/21 0630)  ? fluconazole (DIFLUCAN) IV 200 mg (05/29/21 1315)  ? remdesivir 100 mg in NS 100 mL 100 mg (05/29/21 0857)  ? ? ?PRN Meds: ?acetaminophen **OR**  acetaminophen, alum & mag hydroxide-simeth, guaiFENesin-dextromethorphan, lidocaine (PF), nitroGLYCERIN, senna-docusate, traMADol ? ?Physical Exam ?Vitals and nursing note reviewed.  ?Constitutional:   ?   Appearance: He is cachectic. He is ill-appearing and diaphoretic.  ?   Interventions: Nasal cannula in place.  ?Cardiovascular:  ?   Rate and Rhythm: Tachycardia  present.  ?Pulmonary:  ?   Effort: Pulmonary effort is normal.  ?Neurological:  ?   Mental Status: He is lethargic and disoriented.  ?     ? ?Vital Signs: BP 116/89 (BP Location: Right Wrist)   Pulse (!) 106   Temp 97.8 ?F (36.6 ?C) (Oral)   Resp 20   Ht _0  (1.854 m)   Wt 74.5 kg   SpO2 100%   BMI 21.67 kg/m?  ?SpO2: SpO2: 100 % ?O2 Device: O2 Device: Nasal Cannula ?O2 Flow Rate: O2 Flow Rate (L/min): 2 L/min ? ?Intake/output summary:  ?Intake/Output Summary (Last 24 hours) at 05/30/2021 1011 ?Last data filed at 05/30/2021 0500 ?Gross per 24 hour  ?Intake 2369.76 ml  ?Output 650 ml  ?Net 1719.76 ml  ? ? ?LBM: Last BM Date : 05/27/21 ?Baseline Weight: Weight: 63.6 kg ?Most recent weight: Weight: 74.5 kg ? ?     ?Palliative Assessment/Data: PPS 30% on 20 mL/hr tube feeds ? ? ? ? ? ?Patient Active Problem List  ? Diagnosis Date Noted  ? Pressure injury of skin 05/29/2021  ? Goals of care, counseling/discussion   ? Non-diabetic hypoglycemia 05/24/2021  ? Pancytopenia ([place]) 05/24/2021  ? Thrombocytopenia ([place]) 05/15/2021  ? Hyponatremia 05/10/2021  ? GERD without esophagitis 05/10/2021  ? Protein-calorie malnutrition, severe 05/10/2021  ? 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 Bladder wall thickening 04/08/2021  ? Abdominal pain 04/08/2021  ? Normocytic anemia 04/08/2021  ? Nausea with vomiting 04/08/2021  ? Fever   ? Acute cholecystitis 01/22/2021  ? Complicated UTI (urinary tract infection) 01/22/2021  ? Noncompliance 01/22/2021  ? Lymphadenopathy   ? Chronic low back pain   ? HIV infection ([place])   ? Community acquired pneumonia   ? Nonadherence to medical  treatment   ? COVID-19 virus infection 10/13/2020  ? Alcohol abuse, daily use   ? COVID-19 10/12/2020  ? 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 Blurry vision, left eye   ? AKI

## 2021-05-30 NOTE — Progress Notes (Signed)
?PROGRESS NOTE ? ? ? ?Ricky [name]  NTI:[id number] DOB: 1955/12/21 DOA: 05/24/2021 ?PCP: Pcp, No  ? ? ?Brief Narrative:  ? ?Ricky [name] is a a 66 year old male with past medical history significant for HIV/AIDS (CD4 <35), disseminated MAC, history of cryptococcal meningitis, chronic COVID, dysphagia, debility, severe protein calorie malnutrition, abdominal lymphadenopathy who presented from SNF after being found poorly responsive and found to have a blood glucose in the 30s.  Patient received IV dextrose in route via EMS with improvement of glucose.  Chest x-ray on admission shows retrocardiac opacity suspicious for pneumonia and was started on IV antibiotics.  Infectious disease was consulted due to extensive MAI disease and on multiple antibiotics.  Patient was also found to have severe ascites and severely malnourished with hypoalbuminemia.  Core track feeding tube was placed and was initiated on tube feeds with poor toleration.  Patient underwent IR guided paracentesis on 4/26.  Given his continued decline with poor response to aggressive therapy and in toleration of tube feeds, palliative care was consulted for assistance with goals of care and medical decision making. ? ?Assessment & Plan: ?  ?Acute metabolic encephalopathy ?CT head without contrast with no acute intracranial malady, generalized cerebral atrophy with chronic white matter small vessel ischemic disease, moderate/marked severity pansinus disease.  Etiology likely multifactorial with combination of hypoglycemia, pneumonia, mild dementia and chronic comorbidities such as HIV/AIDS, chronic MAC infection and malnourishment.  Additionally, urine culture positive for Enterococcus faecalis and continues on antibiotics.  Patient continues to be poorly responsive and likely hospice candidate. ?--Palliative care following, given poor toleration to tube feeds and continued decline anticipate likely need of home hospice versus residential  hospice ?--Continue antibiotics for now ? ?Sepsis, POA ?Acute respiratory failure with hypoxia, POA ?Enterococcus UTI ?Patient presenting with confusion, decreased responsiveness and found to have a left retrocardiac opacity on chest x-ray concerning for pneumonia.  CT chest without contrast left lower lobe cavitary nodule with additional soft tissue density nodules extending anteriorly and posteriorly is now replaced by more confluent and larger oval soft tissue density spanning the region of the cavitary nodule, left upper lobe 12 mm and 15 mm nodules appear unchanged, grossly stable mediastinal hilar lymphadenopathy, interval decrease suggesting necrosis of the right aspect of the subcarinal lymph node, small left greater than right pleural effusions, bronchiectasis posterior left upper lobe and bilateral medial lower lobes.  Strep pneumonia urinary antigen negative.  Initially started on azithromycin and ceftriaxone, transitioned to Unasyn for anaerobic coverage. ?--Continue Unasyn 3g IV q8h ? ?COVID-19 viral pneumonia ?Initial COVID-19 positivity 10/12/2020, remains positive.  Likely secondary to immunosuppressed state given HIV/AIDS status.  Seen by ID with recommendation of IV steroids and remdesivir. ?--Remdesivir, DAY #5/5 ?--Decadron 6 mg IV every 24 hours, plan 10-day course ?--Supportive care, maintain SPO2 greater than 92% ?--Continue contact/airborne isolation ? ?Acute renal failure ?Metabolic acidosis ?Etiology likely secondary to prerenal azotemia in the setting of poor oral intake/dehydration.  Baseline creatinine around 1.0.  Creatinine on admission 1.58.  CT abdomen/pelvis with no hydronephrosis seen. ?--Cr 1.58>>1.63>1.68>1.75 ?--Poorly tolerating tube feeds ?--IV fluids with NS at 75 mL/h ? ?HIV/AIDS ?--Descovy and TIVICAY daily ?--Bactrim daily for PCP prophylaxis ? ?History of cryptococcal meningitis ?--Fluconazole ? ?History of disseminated MAC infection ?--Rifabutin, azithromycin,  ethambutol ? ?Mediastinal, extensive retroperitoneal lymphadenopathy ?During last admission, CT surgery consulted for possible biopsy but felt he is not a candidate for VATS/excisional mediastinal biopsy.  General surgery cannot perform an excisional biopsy as patient had multiple comorbidities  and would lead and laparotomy and not a great candidate at this time.  Concern for lymphoma on differential.  Overall poor prognosis with multiple comorbidities and adult failure to thrive. ? ?Left lower lung mass ?Patient would benefit from biopsy of lung mass, but likely unable to tolerate. ? ?Dysphagia ?Evaluated by SLP, recommend NPO.  Core track placed and currently not tolerating tube feeds with multiple vomiting episodes.  Patient very poorly responsive and overall poor prognosis and likely benefit from hospice/comfort care at this time.  Palliative care following.  Family meeting to determine next plan of course. ? 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 ?Hyponatremia ?Etiology likely secondary to poor oral intake with associated anasarca, adult failure to thrive.  Attempted tube feeds but unable to tolerate. ?--Continue IV fluid hydration ? ?GERD: Protonix daily ? ?Pressure injury ?Pressure Injury 05/24/21 Sacrum Mid Unstageable - Full thickness tissue loss in which the base of the injury is covered by slough (yellow, tan, gray, green or brown) and/or eschar (tan, brown or black) in the wound bed. (Active)  ?05/24/21 1800  ?Location: Sacrum  ?Location Orientation: Mid  ?Staging: Unstageable - Full thickness tissue loss in which the base of the injury is covered by slough (yellow, tan, gray, green  or brown) and/or eschar (tan, brown or black) in the wound bed.  ?Wound Description (Comments):   ?Present on Admission: Yes  ?   ?Pressure Injury 05/29/21 Shoulder Left;Posterior Stage 2 -  Partial thickness loss of dermis presenting as a shallow open injury with a red, pink wound bed without slough. (Active)  ?05/29/21 0445  ?Location: Shoulder  ?Location Orientation: Left;Posterior  ?Staging: Stage 2 -  Partial thickness loss of dermis presenting as a shallow open injury with a red, pink wound bed without slough.  ?Wound Description (Comments):   ?Present on Admission: No  ?-- Wound care consulted, continue offloading, local wound care ? ? ?DVT prophylaxis: Place TED hose Start: 05/28/21 1310 ?SCDs Start: 05/24/21 2245 ? ?  Code Status: Full Code ?Family Communication:  ? ?Disposition Plan:  ?Level of care: Progressive ?Status is: Inpatient ?Remains inpatient appropriate because: IV fluid hydration, further discussions needed with family/palliative care, anticipate home with hospice versus residential hospice needs ?  ? ?Consultants:  ?Infectious disease ?PCCM ?Palliative care ? ?Procedures:  ?None ? ?Antimicrobials:  ?Azithromycin 4/22, 4/24>> ?Ceftriaxone 4/21 - 4/22 ?Unasyn 4/23>> ?Remdesivir 4/23>> ?Fluconazole ?Ethambutol ?Bactrim ? ? ?Subjective: ?Patient seen examined bedside, resting comfortably.  Poorly interactive.  No family present.  Received update from palliative care that family likely will proceed with home with hospice but awaiting for family arrival to the hospital for further meeting with palliative care.  Overall very poor/grim prognosis with continued decline.  Unable to obtain any further ROS from patient due to his current mental status.  No other acute concerns overnight per nursing staff. ? ?Objective: ?Vitals:  ? 05/29/21 1245 05/29/21 2128 05/30/21 0500 05/30/21 0858  ?BP: (!) 116/92 (!) 123/99  116/89  ?Pulse:  (!) 106    ?Resp:  20    ?Temp:  (!) 96.3 ?F (35.7 ?C)  97.8 ?F (36.6 ?C)   ?TempSrc:  Axillary  Oral  ?SpO2:  100%    ?Weight:   74.5 kg   ?Height:      ? ? ?  Intake/Output Summary (Last 24 hours) at 05/30/2021 1146 ?Last data filed at 05/30/2021 1000 ?Gross per 24 hour  ?Intake 2694.71 ml  ?Output 650

## 2021-05-30 NOTE — Progress Notes (Signed)
Manufacturing engineer [facility]) Hospital Liaison Note ? ?Referral received for patient/family interest in hospice at home. Chart under review by [facility] physician.  ? ?Hospice eligibility pending.  ? ?Plan is to discharge home tomorrow via private care.  ? ?DME in the home: none ? ?DME needs: hospital bed, oxygn, BSC, over the bed table, walker wheelchair, shower chair.  ? ?[name] is the contact for DME delivery.  ? ?Please send patient home with comfort medications/prescriptions at discharge.  ? ?Please call with any questions or concerns. Thank you ? ?Roselee Nova, LCSW ?[facility] Liaison ?[phone] ?

## 2021-05-30 NOTE — Progress Notes (Addendum)
Nutrition Follow-up ? ?DOCUMENTATION CODES:  ? ?Severe malnutrition in context of chronic illness ? ?INTERVENTION:  ? ?For now, continue TF via Cortrak: ?Vital 1.5 at 20 ml/h to provide 720 kcal, 32 gm protein, 367 ml free water daily.  ? ?Pending goals of care decisions, consider increasing by 10 ml every 12 hours to goal rate of 60 ml/h (1440 ml per day) to provide 2160 kcal, 97 gm protein, 1100 ml free water daily. ? ?Continue MVI with minerals via tube once daily. ?  ?Continue to monitor magnesium, potassium, and phosphorus, MD to replete as needed, as pt is at risk for refeeding syndrome given severe malnutrition. ? ?If plans for comfort care, recommend d/c tube feeding and remove Cortrak tube.  ?  ?NUTRITION DIAGNOSIS:  ? 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 ? ?Ongoing  ? ?GOAL:  ? ?Patient will meet greater than or equal to 90% of their needs ? ?Unmet  ? ?MONITOR:  ? ?TF tolerance, Labs, Diet advancement ? ?REASON FOR ASSESSMENT:  ? ?Consult ?Enteral/tube feeding initiation and management ? ?ASSESSMENT:  ? ?66 yo male admitted with hypoglycemia and decreased mentation. 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 ? ?Palliative care team is following patient. Awaiting family arrival to the hospital for further discussions regarding goals of care.  ? ?Tube feeding is infusing via Cortrak tube at 20 ml/h. No emesis since yesterday morning.   ? ?Labs and medications reviewed. ?CBG: [phone] ?Potassium, phosphorus, and magnesium were all WNL this morning.  ? ?Diet Order:   ?Diet Order   ? ?       ?  Diet NPO time specified  Diet effective now       ?  ? ?  ?  ? ?  ? ? ?EDUCATION NEEDS:  ? ?Not appropriate for education at this  time ? ?Skin:  Skin Assessment: Skin Integrity Issues: ?Skin Integrity Issues:: Stage II, Unstageable ?Stage II: L shoulder ?Unstageable: sacrum ? ?Last BM:  4/25 type 6 ? ?Height:  ? ?Ht Readings from Last 1 Encounters:  ?05/24/21 '6\' 1"'$  (1.854 m)  ? ? ?Weight:  ? ?Wt Readings from Last 1 Encounters:  ?05/30/21 74.5 kg  ? ? ?BMI:  Body mass index is 21.67 kg/m?. ? ?Estimated Nutritional Needs:  ? ?Kcal:  2000-2200 ? ?Protein:  100-120 gm ? ?Fluid:  1.8-2 L ? ? ? ?Lucas Mallow RD, LDN, CNSC ?Please refer to Amion for contact information.                                                       ? ?

## 2021-05-30 NOTE — Progress Notes (Signed)
Pharmacy Antibiotic Note ? ?[name] is a 66 y.o. male admitted on 05/24/2021 with pneumonia.  Pharmacy has been consulted for unasyn dosing per ID. Patient is also COVID positive on 05/24/21 and has been started on remdesivir and steroids per ID. ? ?Of note, patient has HIV with recent undetectable CD4 count and is immunocompromised. He has history of multiple opportunistic infections. He is on the following: ? ?-HIV: tivicay, descovy ?-H/x cryptococcal meningitis fluconazole ?-Dissemiunated mycobaterial inf: azithromycin, rifabutin, ethambutol ?-PJP ppx: bactrim SS daily ? ?WBC low at 2.7. Scr stable at 1.75, afebrile.  ?Urine positive for Enterococcus, S-Ampicillin ? ?Plan: ?Continue Unasyn 3 g q8h ?F/u cultures, clinical course, s/x of infection and ability to de-escalate ?F/u ID team recommendations ? ?Height: '6\' 1"'$  (185.4 cm) ?Weight: 74.5 kg (164 lb 3.9 oz) ?IBW/kg (Calculated) : 79.9 ? ?Temp (24hrs), Avg:96.3 ?F (35.7 ?C), Min:96.3 ?F (35.7 ?C), Max:96.3 ?F (35.7 ?C) ? ?Recent Labs  ?Lab 05/24/21 ?2000 05/25/21 ?7253 05/26/21 ?6644 05/26/21 ?0347 05/27/21 ?4259 05/28/21 ?5638 05/28/21 ?7564 05/30/21 ?3329  ?WBC 2.5* 2.3* 3.5* 2.9* 3.5*  --  5.2 2.7*  ?CREATININE 1.58* 1.63* 1.70* 1.68* 1.59* 1.75*  --   --   ?LATICACIDVEN 1.6  --   --   --   --   --   --   --   ? ?  ?Estimated Creatinine Clearance: 45.5 mL/min (A) (by C-G formula based on SCr of 1.75 mg/dL (H)).   ? ?Allergies  ?Allergen Reactions  ? Bactrim [Sulfamethoxazole-Trimethoprim] Other (See Comments)  ?  Patient was trialed on DS TIW and SS daily for PJP prophylaxis and developed hyperkalemia and increased Scr (pt is currently taking bactrim per infectious disease notes 05/07/21)  ? ? ?Antimicrobials this admission: ?4/21 ceftriaxone >> 4/23 ?4/23 unasyn >> ?4/22 azithromycin >>  ? ?4/23 remdesivir>> ? ?Dose adjustments this admission: ?NA ? ?Microbiology results: ?4/21 BCx: ngtd ?4/23 UCx: enterococcus >100K ?4/22 Sputum: set  ?4/22 MRSA PCR:  neg ?4/21: COVID positive ? ?Kwinton Maahs A. Levada Dy, PharmD, BCPS, FNKF ?Clinical Pharmacist ?[place] ?Please utilize Amion for appropriate phone number to reach the unit pharmacist ([place]) ? ? ?Caleb Prigmore A Matthe Sloane ?05/30/2021 7:30 AM ? ?

## 2021-05-30 NOTE — Progress Notes (Signed)
Bipap not indicated at this time. RT will continue to monitor as needed. ?

## 2021-05-31 ENCOUNTER — Other Ambulatory Visit (HOSPITAL_COMMUNITY): Payer: Self-pay

## 2021-05-31 DIAGNOSIS — E162 Hypoglycemia, unspecified: Secondary | ICD-10-CM | POA: Diagnosis not present

## 2021-05-31 LAB — BASIC METABOLIC PANEL
Anion gap: 1 — ABNORMAL LOW (ref 5–15)
BUN: 27 mg/dL — ABNORMAL HIGH (ref 8–23)
CO2: 19 mmol/L — ABNORMAL LOW (ref 22–32)
Calcium: 8 mg/dL — ABNORMAL LOW (ref 8.9–10.3)
Chloride: 116 mmol/L — ABNORMAL HIGH (ref 98–111)
Creatinine, Ser: 1.25 mg/dL — ABNORMAL HIGH (ref 0.61–1.24)
GFR, Estimated: >60 mL/min (ref >60)
Glucose, Bld: 166 mg/dL — ABNORMAL HIGH (ref 70–99)
Potassium: 3.3 mmol/L — ABNORMAL LOW (ref 3.5–5.1)
Sodium: 136 mmol/L (ref 135–145)

## 2021-05-31 LAB — CBC
HCT: 23.5 % — ABNORMAL LOW (ref 39.0–52.0)
Hemoglobin: 7.6 g/dL — ABNORMAL LOW (ref 13.0–17.0)
MCH: 30 pg (ref 26.0–34.0)
MCHC: 32.3 g/dL (ref 30.0–36.0)
MCV: 92.9 fL (ref 80.0–100.0)
Platelets: 38 10*3/uL — ABNORMAL LOW (ref 150–400)
RBC: 2.53 MIL/uL — ABNORMAL LOW (ref 4.22–5.81)
RDW: 21.6 % — ABNORMAL HIGH (ref 11.5–15.5)
WBC: 2.4 10*3/uL — ABNORMAL LOW (ref 4.0–10.5)
nRBC: 0.9 % — ABNORMAL HIGH (ref 0.0–0.2)

## 2021-05-31 LAB — GLUCOSE, CAPILLARY
Glucose-Capillary: 168 mg/dL — ABNORMAL HIGH (ref 70–99)
Glucose-Capillary: 134 mg/dL — ABNORMAL HIGH (ref 70–99)
Glucose-Capillary: 154 mg/dL — ABNORMAL HIGH (ref 70–99)
Glucose-Capillary: 150 mg/dL — ABNORMAL HIGH (ref 70–99)

## 2021-05-31 MED ORDER — MORPHINE SULFATE (CONCENTRATE) 20 MG/ML PO SOLN
5.0000 mg | ORAL | 0 refills | Status: AC | PRN
  Filled 2021-05-31: qty 8, 5d supply, fill #0

## 2021-05-31 NOTE — Progress Notes (Signed)
?                                                                                                                                                     ?                                                   ?Daily Progress Note  ? ?Patient Name: Ricky [name]       Date: 05/31/2021 ?DOB: February 20, 1955  Age: 66 y.o. MRN#: [id number] ?Attending Physician: [place] Eric J, DO ?Primary Care Physician: Pcp, No ?Admit Date: 05/24/2021 ? ?Reason for Consultation/Follow-up: Establishing goals of care ? ?Subjective: ?Medical records reviewed. Patient assessed at the bedside. Remains encephalopathic, lethargic. No family at the bedside. ? ?Called patient's sister [name] to continue conversation regarding code status and assist with palliative medicine needs. She states that she has not yet discussed code status with family. She will be proceeding with [facility] hospice and is shopping for sheets to fit the hospital bed. Emphasized the importance of ongoing conversation with family, ensuring decisions are within the context of the patient?s values and GOCs.  ? ?Questions and concerns addressed. Encouraged to call with questions and/or concerns. PMT will continue to support holistically.  ? ? ?Length of Stay: 7 ? ?Current Medications: ?Scheduled Meds:  ? azithromycin  500 mg Per Tube Daily  ? Chlorhexidine Gluconate Cloth  6 each Topical Daily  ? dexamethasone (DECADRON) injection  6 mg Intravenous Daily  ? dolutegravir  50 mg Per Tube Daily  ? emtricitabine-tenofovir AF  1 tablet Per Tube Daily  ? ethambutol  15 mg/kg Per Tube Daily  ? famotidine  20 mg Per Tube BID  ? feeding supplement (VITAL 1.5 CAL)  1,000 mL Per Tube Q24H  ? free water  100 mL Per Tube Q4H  ? insulin aspart  0-4 Units Subcutaneous TID WC  ? leptospermum manuka honey  1 application. Topical Daily  ? mouth rinse  15 mL Mouth Rinse BID  ? pantoprazole sodium  40 mg Per Tube Daily  ? rifabutin  300 mg Per Tube Daily  ? sodium chloride flush  3 mL Intravenous Q12H  ?  sulfamethoxazole-trimethoprim  1 tablet Per Tube Daily  ? ? ?Continuous Infusions: ? sodium chloride 75 mL/hr at 05/30/21 2200  ? ampicillin-sulbactam (UNASYN) IV 3 g (05/31/21 0631)  ? fluconazole (DIFLUCAN) IV Stopped (05/30/21 1332)  ? ? ?PRN Meds: ?acetaminophen **OR** acetaminophen, alum & mag hydroxide-simeth, guaiFENesin-dextromethorphan, lidocaine (PF), morphine injection, nitroGLYCERIN, senna-docusate, traMADol ? ?Physical Exam ?Vitals and nursing note reviewed.  ?Constitutional:   ?   Appearance: He is cachectic. He is ill-appearing and diaphoretic.  ?   Interventions:  Nasal cannula in place.  ?Cardiovascular:  ?   Rate and Rhythm: Tachycardia present.  ?Pulmonary:  ?   Effort: Pulmonary effort is normal.  ?Neurological:  ?   Mental Status: He is lethargic and disoriented.  ?     ? ?Vital Signs: BP 112/79 (BP Location: Right Arm)   Pulse 87   Temp 98.2 ?F (36.8 ?C) (Oral)   Resp 17   Ht _0  (1.854 m)   Wt 74.5 kg   SpO2 100%   BMI 21.67 kg/m?  ?SpO2: SpO2: 100 % ?O2 Device: O2 Device: Nasal Cannula ?O2 Flow Rate: O2 Flow Rate (L/min): 2 L/min ? ?Intake/output summary:  ?Intake/Output Summary (Last 24 hours) at 05/31/2021 1240 ?Last data filed at 05/31/2021 0100 ?Gross per 24 hour  ?Intake 599.89 ml  ?Output 550 ml  ?Net 49.89 ml  ? ? ?LBM: Last BM Date : 05/30/21 ?Baseline Weight: Weight: 63.6 kg ?Most recent weight: Weight: 74.5 kg ? ?     ?Palliative Assessment/Data: PPS 30% on 20 mL/hr tube feeds ? ? ? ? ? ?Patient Active Problem List  ? Diagnosis Date Noted  ? Pressure injury of skin 05/29/2021  ? Goals of care, counseling/discussion   ? Non-diabetic hypoglycemia 05/24/2021  ? Pancytopenia ([place]) 05/24/2021  ? Thrombocytopenia ([place]) 05/15/2021  ? Hyponatremia 05/10/2021  ? GERD without esophagitis 05/10/2021  ? Protein-calorie malnutrition, severe 05/10/2021  ? 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 Bladder wall thickening 04/08/2021  ? Abdominal pain 04/08/2021  ? Normocytic anemia 04/08/2021  ?  Nausea with vomiting 04/08/2021  ? Fever   ? Acute cholecystitis 01/22/2021  ? Complicated UTI (urinary tract infection) 01/22/2021  ? Noncompliance 01/22/2021  ? Lymphadenopathy   ? Chronic low back pain   ? HIV infection ([place])   ? Community acquired pneumonia   ? Nonadherence to medical treatment   ? COVID-19 virus infection 10/13/2020  ? Alcohol abuse, daily use   ? COVID-19 10/12/2020  ? 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 Blurry vision, left eye   ? AKI (acute kidney injury) ([place])   ? Cavitary lesion of lung   ? Acute nonintractable headache   ? Smoking   ? Cocaine use   ? Paroxysmal SVT (supraventricular tachycardia) ([facility])   ? Abnormal EKG   ? Atrial flutter ([place]) 03/27/2020  ? Aortic atherosclerosis ([place]) 03/27/2020  ? Hypertension 03/27/2020  ? 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 Leukocytopenia 03/26/2020  ? Macrocytosis 03/26/2020  ? Moderate protein malnutrition ([place]) 03/26/2020  ? Hypoalbuminemia 03/26/2020  ? AIDS (acquired immune deficiency syndrome) ([place]) 03/26/2020  ? ? ?Palliative Care Assessment & Plan  ? ?Patient Profile: ?66 y.o. male  with past medical history of  HIV/AIDS with recent CD4 <35, disseminated MAC, history of cryptococcal meningitis, chronic COVID, dysphagia, debility, severe malnutrition, and abnormal lymphadenopathy admitted on 05/24/2021 with hypoglycemia and poor responsiveness at SNF. ?  ?Patient was recently discharged to SNF on 05/21/21 and is very cachectic, frail, now with AKI and acute encephalopathy. PMT has been consulted to assist with goals of care conversation.  ? ?Assessment: ?Principal Problem: ?  Non-diabetic hypoglycemia ?Active Problems: ?  AIDS (acquired immune deficiency syndrome) ([place]) ?  AKI (acute kidney injury) ([place]) ?  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  COVID-19 ?  Community acquired pneumonia ?  Lymphadenopathy ?  Protein-calorie malnutrition, severe ?  Pancytopenia ([place]) ?   Pressure injury of skin ?  Concern about end of life ? ?Recommendations/Plan: ?Full code ?  Discharge home with hospice today  ?PMT remains available for acute needs ? ?Prognosis: ? Poor ? ?Discharge Planning: ?To Be Determined ? ? ?MDM: High ? ? ?Dorthy Cooler, PA-C ?Palliative Medicine Team ?Team phone # [phone] ? ?Thank you for allowing the Palliative Medicine Team to assist in the care of this patient. Please utilize secure chat with additional questions, if there is no response within 30 minutes please call the above phone number. ? ?Palliative Medicine Team providers are available by phone from 7am to 7pm daily and can be reached through the team cell phone.  ?Should this patient require assistance outside of these hours, please call the patient's attending physician.  ? ?

## 2021-05-31 NOTE — Discharge Summary (Signed)
?Physician Discharge Summary  ?Ricky [name] JKK:[id number] DOB: [birth year] DOA: 05/24/2021 ? ?PCP: Pcp, No ? ?Admit date: 05/24/2021 ?Discharge date: 05/31/2021 ? ?Admitted From: Home ?Disposition: Home with hospice ? ?Recommendations for Outpatient Follow-up:  ?Follow-up with [facility] ? ? ?Discharge Condition: Guarded, anticipate death in days-weeks ?CODE STATUS: Full code ?Diet recommendation: Comfort feeds as tolerates ? ?History of present illness: ? ?Ricky [name] is a a 66 year old male with past medical history significant for HIV/AIDS (CD4 <35), disseminated MAC, history of cryptococcal meningitis, chronic COVID, dysphagia, debility, severe protein calorie malnutrition, abdominal lymphadenopathy who presented from SNF after being found poorly responsive and found to have a blood glucose in the 30s.  Patient received IV dextrose in route via EMS with improvement of glucose.  Chest x-ray on admission shows retrocardiac opacity suspicious for pneumonia and was started on IV antibiotics.  Infectious disease was consulted due to extensive MAI disease and on multiple antibiotics.  Patient was also found to have severe ascites and severely malnourished with hypoalbuminemia.  Core track feeding tube was placed and was initiated on tube feeds with poor toleration.  Patient underwent IR guided paracentesis on 4/26.  Given his continued decline with poor response to aggressive therapy and in toleration of tube feeds, palliative care was consulted for assistance with goals of care and medical decision making. ? ?Hospital course: ? ?Acute metabolic encephalopathy ?CT head without contrast with no acute intracranial malady, generalized cerebral atrophy with chronic white matter small vessel ischemic disease, moderate/marked severity pansinus disease.  Etiology likely multifactorial with combination of hypoglycemia, pneumonia, mild dementia and chronic comorbidities such as HIV/AIDS, chronic MAC infection and  malnourishment.  Additionally, urine culture positive for Enterococcus faecalis and continues on antibiotics.  Patient continues to be poorly responsive and likely hospice candidate. ?--Palliative care following, given poor toleration to tube feeds and continued decline anticipate likely need of home hospice versus residential hospice ?--Continue antibiotics for now ?  ?Sepsis, POA ?Acute respiratory failure with hypoxia, POA ?Enterococcus UTI ?Patient presenting with confusion, decreased responsiveness and found to have a left retrocardiac opacity on chest x-ray concerning for pneumonia.  CT chest without contrast left lower lobe cavitary nodule with additional soft tissue density nodules extending anteriorly and posteriorly is now replaced by more confluent and larger oval soft tissue density spanning the region of the cavitary nodule, left upper lobe 12 mm and 15 mm nodules appear unchanged, grossly stable mediastinal hilar lymphadenopathy, interval decrease suggesting necrosis of the right aspect of the subcarinal lymph node, small left greater than right pleural effusions, bronchiectasis posterior left upper lobe and bilateral medial lower lobes.  Strep pneumonia urinary antigen negative.  Initially started on azithromycin and ceftriaxone, transitioned to Unasyn for anaerobic coverage.  Completed 7-day course of Unasyn while inpatient. ?  ?COVID-19 viral pneumonia ?Initial COVID-19 positivity 10/12/2020, remains positive.  Likely secondary to immunosuppressed state given HIV/AIDS status.  Seen by ID with recommendation of IV steroids and remdesivir.  Completed 5-day course of remdesivir on 05/30/2021.  Completed 7-day course of IV Decadron. ?  ?Acute renal failure ?Metabolic acidosis ?Etiology likely secondary to prerenal azotemia in the setting of poor oral intake/dehydration.  Baseline creatinine around 1.0.  Creatinine on admission 1.58.  CT abdomen/pelvis with no hydronephrosis seen.  Creatinine improved to  1.25 at time of discharge.  Now transition to hospice care. ?  ?HIV/AIDS ?Descovy, TIVICAY and Bactrim discontinued now transitioning to hospice ?  ?History of cryptococcal meningitis ?Fluconazole discontinued ?  ?History of disseminated  MAC infection ?Rifabutin, azithromycin, ethambutol discontinued ?  ?Mediastinal, extensive retroperitoneal lymphadenopathy ?During last admission, CT surgery consulted for possible biopsy but felt he is not a candidate for VATS/excisional mediastinal biopsy.  General surgery cannot perform an excisional biopsy as patient had multiple comorbidities and would lead and laparotomy and not a great candidate at this time.  Concern for lymphoma on differential.  Overall poor prognosis with multiple comorbidities and adult failure to thrive.  Transitioning to home hospice. ?  ?Left lower lung mass ?Patient would benefit from biopsy of lung mass, but likely unable to tolerate. ?  ?Dysphagia ?Evaluated by SLP, recommend NPO.  Core track placed and currently not tolerating tube feeds with multiple vomiting episodes.  Patient very poorly responsive and overall poor prognosis and likely benefit from hospice/comfort care at this time.  Palliative care following.  Discharge home with home hospice. ? ?  ?Severe protein calorie malnutrition ?Nutrition Status: ?Nutrition Problem: Severe Malnutrition ?Etiology: chronic illness (HIV, bronchiectatic cavitary lung disease, chronic COVID-19) ?Signs/Symptoms: severe muscle depletion, severe fat depletion ?Interventions: Tube feeding, MVI ?Given his severe dysphagia, core track was placed during hospitalization.  Did not tolerate tube feeds and unable to increase tube feed rate due to multiple episodes of vomiting.  Now transitioning to home hospice.   ?  ?Hyponatremia ?Etiology likely secondary to poor oral intake with associated anasarca, adult failure to thrive.  Attempted tube feeds but unable to tolerate. ? ?  ?GERD: PPI discontinued ?  ?Pressure  injury ?Pressure Injury 05/24/21 Sacrum Mid Unstageable - Full thickness tissue loss in which the base of the injury is covered by slough (yellow, tan, gray, green or brown) and/or eschar (tan, brown or black) in the wound bed. (Active)  ?05/24/21 1800  ?Location: Sacrum  ?Location Orientation: Mid  ?Staging: Unstageable - Full thickness tissue loss in which the base of the injury is covered by slough (yellow, tan, gray, green or brown) and/or eschar (tan, brown or black) in the wound bed.  ?Wound Description (Comments):   ?Present on Admission: Yes  ?   ?Pressure Injury 05/29/21 Shoulder Left;Posterior Stage 2 -  Partial thickness loss of dermis presenting as a shallow open injury with a red, pink wound bed without slough. (Active)  ?05/29/21 0445  ?Location: Shoulder  ?Location Orientation: Left;Posterior  ?Staging: Stage 2 -  Partial thickness loss of dermis presenting as a shallow open injury with a red, pink wound bed without slough.  ?Wound Description (Comments):   ?Present on Admission: No  ?Continue local wound care ? ? ? ? ? ?Discharge Diagnoses:  ?Principal Problem: ?  Non-diabetic hypoglycemia ?Active Problems: ?  COVID-19 ?  AIDS (acquired immune deficiency syndrome) ([place]) ?  AKI (acute kidney injury) ([place]) ?  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  Community acquired pneumonia ?  Lymphadenopathy ?  Protein-calorie malnutrition, severe ?  Pancytopenia ([place]) ?  Pressure injury of skin ? ? ? ?Discharge Instructions ? ?Discharge Instructions   ? ? Diet - low sodium heart healthy   Complete by: As directed ?  ? Increase activity slowly   Complete by: As directed ?  ? No wound care   Complete by: As directed ?  ? ?  ? ?Allergies as of 05/31/2021   ? ?   Reactions  ? Bactrim [sulfamethoxazole-trimethoprim] Other (See Comments)  ? Patient was trialed on DS TIW and SS daily for PJP prophylaxis and developed hyperkalemia and increased Scr (pt is currently taking bactrim per  infectious disease  notes 05/07/21)  ? ?  ? ?  ?Medication List  ?  ? ?STOP taking these medications   ? ?acetaminophen 500 MG tablet ?Commonly known as: TYLENOL ?  ?azithromycin 500 MG tablet ?Commonly known as: ZITHROMAX ?

## 2021-05-31 NOTE — TOC Progression Note (Signed)
Transition of Care (TOC) - Progression Note  ? ? ?Patient Details  ?Name: Ricky [name] ?MRN: [id number] ?Date of Birth: 1955-12-25 ? ?Transition of Care (TOC) CM/SW Contact  ?Joanne Chars, LCSW ?Phone Number: ?05/31/2021, 2:50 PM ? ?Clinical Narrative:   CSW spoke with Shanita/[facility].  DME will be delivered today, would be ready for DC.   ? ? ? ?Expected Discharge Plan: [place] ?Barriers to Discharge: Other (must enter comment) ? ?Expected Discharge Plan and Services ?Expected Discharge Plan: [place] ?In-house Referral: Clinical Social Work ?  ?Post Acute Care Choice: Hospice ?Living arrangements for the past 2 months: Apartment ?Expected Discharge Date: 05/31/21               ?  ?  ?  ?  ?  ?  ?  ?  ?  ?  ? ? ?Social Determinants of Health (SDOH) Interventions ?  ? ?Readmission Risk Interventions ? ?  10/26/2020  ? 12:55 PM 04/25/2020  ? 10:22 AM  ?Readmission Risk Prevention Plan  ?Transportation Screening Complete Complete  ?PCP or Specialist Appt within 5-7 Days Complete   ?PCP or Specialist Appt within 3-5 Days  Complete  ?Home Care Screening Complete   ?[place] or Home Care Consult  Complete  ?Social Work Consult for [place] Planning/Counseling  Complete  ?Palliative Care Screening  Not Applicable  ?Medication Review Press photographer)  Complete  ? ? ?

## 2021-05-31 NOTE — Plan of Care (Signed)
?  Problem: Activity: ?Goal: Ability to tolerate increased activity will improve ?Outcome: Not Progressing ?  ?Problem: Clinical Measurements: ?Goal: Ability to maintain a body temperature in the normal range will improve ?Outcome: Not Progressing ?  ?Problem: Respiratory: ?Goal: Ability to maintain adequate ventilation will improve ?Outcome: Not Progressing ?Goal: Ability to maintain a clear airway will improve ?Outcome: Not Progressing ?  ?Problem: Safety: ?Goal: Non-violent Restraint(s) ?Outcome: Not Progressing ?  ?Problem: Education: ?Goal: Knowledge of General Education information will improve ?Description: Including pain rating scale, medication(s)/side effects and non-pharmacologic comfort measures ?Outcome: Not Progressing ?  ?Problem: Health Behavior/Discharge Planning: ?Goal: Ability to manage health-related needs will improve ?Outcome: Not Progressing ?  ?Problem: Clinical Measurements: ?Goal: Ability to maintain clinical measurements within normal limits will improve ?Outcome: Not Progressing ?Goal: Will remain free from infection ?Outcome: Not Progressing ?Goal: Diagnostic test results will improve ?Outcome: Not Progressing ?Goal: Respiratory complications will improve ?Outcome: Not Progressing ?Goal: Cardiovascular complication will be avoided ?Outcome: Not Progressing ?  ?Problem: Activity: ?Goal: Risk for activity intolerance will decrease ?Outcome: Not Progressing ?  ?Problem: Nutrition: ?Goal: Adequate nutrition will be maintained ?Outcome: Not Progressing ?  ?Problem: Coping: ?Goal: Level of anxiety will decrease ?Outcome: Not Progressing ?  ?Problem: Elimination: ?Goal: Will not experience complications related to bowel motility ?Outcome: Not Progressing ?Goal: Will not experience complications related to urinary retention ?Outcome: Not Progressing ?  ?Problem: Pain Managment: ?Goal: General experience of comfort will improve ?Outcome: Not Progressing ?  ?Problem: Safety: ?Goal: Ability to  remain free from injury will improve ?Outcome: Not Progressing ?  ?Problem: Skin Integrity: ?Goal: Risk for impaired skin integrity will decrease ?Outcome: Not Progressing ?  ?

## 2021-05-31 NOTE — TOC Transition Note (Signed)
Transition of Care (TOC) - CM/SW Discharge Note ? ? ?Patient Details  ?Name: Ricky [name] ?MRN: [id number] ?Date of Birth: 12/03/1955 ? ?Transition of Care [facility]) CM/SW Contact:  ?Joanne Chars, LCSW ?Phone Number: ?05/31/2021, 3:00 PM ? ? ?Clinical Narrative:   Pt discharging home with [facility] home hospice.  Sister is aware and requesting a call when pt is picked up and on the way. ? ? ? ?Final next level of care: [place] ?Barriers to Discharge: Barriers Resolved ? ? ?Patient Goals and CMS Choice ?  ?  ?Choice offered to / list presented to : Sibling (sister [name]) ? ?Discharge Placement ?  ?           ?Patient chooses bed at:  (home with [facility]) ?Patient to be transferred to facility by: [facility] ?Name of family member notified: sister [name] ?Patient and family notified of of transfer: 05/31/21 ? ?Discharge Plan and Services ?In-house Referral: Clinical Social Work ?  ?Post Acute Care Choice: Hospice          ?  ?  ?  ?  ?  ?  ?  ?  ?  ?  ? ?Social Determinants of Health (SDOH) Interventions ?  ? ? ?Readmission Risk Interventions ? ?  10/26/2020  ? 12:55 PM 04/25/2020  ? 10:22 AM  ?Readmission Risk Prevention Plan  ?Transportation Screening Complete Complete  ?PCP or Specialist Appt within 5-7 Days Complete   ?PCP or Specialist Appt within 3-5 Days  Complete  ?Home Care Screening Complete   ?[place] or Home Care Consult  Complete  ?Social Work Consult for [place] Planning/Counseling  Complete  ?Palliative Care Screening  Not Applicable  ?Medication Review Press photographer)  Complete  ? ? ? ? ? ?

## 2021-05-31 NOTE — Plan of Care (Signed)
?  Problem: Activity: ?Goal: Ability to tolerate increased activity will improve ?Outcome: Progressing ?  ?Problem: Clinical Measurements: ?Goal: Ability to maintain a body temperature in the normal range will improve ?Outcome: Progressing ?  ?Problem: Respiratory: ?Goal: Ability to maintain adequate ventilation will improve ?Outcome: Progressing ?Goal: Ability to maintain a clear airway will improve ?Outcome: Progressing ?  ?Problem: Safety: ?Goal: Non-violent Restraint(s) ?Outcome: Progressing ?  ?Problem: Education: ?Goal: Knowledge of General Education information will improve ?Description: Including pain rating scale, medication(s)/side effects and non-pharmacologic comfort measures ?Outcome: Progressing ?  ?Problem: Health Behavior/Discharge Planning: ?Goal: Ability to manage health-related needs will improve ?Outcome: Progressing ?  ?Problem: Health Behavior/Discharge Planning: ?Goal: Ability to manage health-related needs will improve ?Outcome: Progressing ?  ?Problem: Clinical Measurements: ?Goal: Ability to maintain clinical measurements within normal limits will improve ?Outcome: Progressing ?Goal: Will remain free from infection ?Outcome: Progressing ?Goal: Diagnostic test results will improve ?Outcome: Progressing ?Goal: Respiratory complications will improve ?Outcome: Progressing ?Goal: Cardiovascular complication will be avoided ?Outcome: Progressing ?  ?Problem: Activity: ?Goal: Risk for activity intolerance will decrease ?Outcome: Progressing ?  ?Problem: Nutrition: ?Goal: Adequate nutrition will be maintained ?Outcome: Progressing ?  ?Problem: Elimination: ?Goal: Will not experience complications related to bowel motility ?Outcome: Progressing ?Goal: Will not experience complications related to urinary retention ?Outcome: Progressing ?  ?

## 2021-05-31 NOTE — Progress Notes (Signed)
?PROGRESS NOTE ? ? ? ?Ricky [name]  NOM:[id number] DOB: 30-Oct-1955 DOA: 05/24/2021 ?PCP: Pcp, No  ? ? ?Brief Narrative:  ? ?Ricky [name] is a a 66 year old male with past medical history significant for HIV/AIDS (CD4 <35), disseminated MAC, history of cryptococcal meningitis, chronic COVID, dysphagia, debility, severe protein calorie malnutrition, abdominal lymphadenopathy who presented from SNF after being found poorly responsive and found to have a blood glucose in the 30s.  Patient received IV dextrose in route via EMS with improvement of glucose.  Chest x-ray on admission shows retrocardiac opacity suspicious for pneumonia and was started on IV antibiotics.  Infectious disease was consulted due to extensive MAI disease and on multiple antibiotics.  Patient was also found to have severe ascites and severely malnourished with hypoalbuminemia.  Core track feeding tube was placed and was initiated on tube feeds with poor toleration.  Patient underwent IR guided paracentesis on 4/26.  Given his continued decline with poor response to aggressive therapy and in toleration of tube feeds, palliative care was consulted for assistance with goals of care and medical decision making. ? ?Assessment & Plan: ?  ?Acute metabolic encephalopathy ?CT head without contrast with no acute intracranial malady, generalized cerebral atrophy with chronic white matter small vessel ischemic disease, moderate/marked severity pansinus disease.  Etiology likely multifactorial with combination of hypoglycemia, pneumonia, mild dementia and chronic comorbidities such as HIV/AIDS, chronic MAC infection and malnourishment.  Additionally, urine culture positive for Enterococcus faecalis and continues on antibiotics.  Patient continues to be poorly responsive and likely hospice candidate. ?--Palliative care following, given poor toleration to tube feeds and continued decline anticipate likely need of home hospice versus residential  hospice ?--Continue antibiotics for now ? ?Sepsis, POA ?Acute respiratory failure with hypoxia, POA ?Enterococcus UTI ?Patient presenting with confusion, decreased responsiveness and found to have a left retrocardiac opacity on chest x-ray concerning for pneumonia.  CT chest without contrast left lower lobe cavitary nodule with additional soft tissue density nodules extending anteriorly and posteriorly is now replaced by more confluent and larger oval soft tissue density spanning the region of the cavitary nodule, left upper lobe 12 mm and 15 mm nodules appear unchanged, grossly stable mediastinal hilar lymphadenopathy, interval decrease suggesting necrosis of the right aspect of the subcarinal lymph node, small left greater than right pleural effusions, bronchiectasis posterior left upper lobe and bilateral medial lower lobes.  Strep pneumonia urinary antigen negative.  Initially started on azithromycin and ceftriaxone, transitioned to Unasyn for anaerobic coverage. ?--Continue Unasyn 3g IV q8h; to complete 7-day course ? ?COVID-19 viral pneumonia ?Initial COVID-19 positivity 10/12/2020, remains positive.  Likely secondary to immunosuppressed state given HIV/AIDS status.  Seen by ID with recommendation of IV steroids and remdesivir.  Completed 5-day course of remdesivir on 05/30/2021. ?--Decadron 6 mg IV every 24 hours, plan 10-day course ?--Supportive care, maintain SPO2 greater than 92% ?--Continue contact/airborne isolation ? ?Acute renal failure ?Metabolic acidosis ?Etiology likely secondary to prerenal azotemia in the setting of poor oral intake/dehydration.  Baseline creatinine around 1.0.  Creatinine on admission 1.58.  CT abdomen/pelvis with no hydronephrosis seen. ?--Cr 1.58>>1.63>1.68>1.75>1.25 ?--Poorly tolerating tube feeds ?--IV fluids with NS at 75 mL/h ? ?HIV/AIDS ?--Descovy and TIVICAY daily ?--Bactrim daily for PCP prophylaxis ? ?History of cryptococcal meningitis ?--Fluconazole ? ?History of  disseminated MAC infection ?--Rifabutin, azithromycin, ethambutol ? ?Mediastinal, extensive retroperitoneal lymphadenopathy ?During last admission, CT surgery consulted for possible biopsy but felt he is not a candidate for VATS/excisional mediastinal biopsy.  General surgery cannot  perform an excisional biopsy as patient had multiple comorbidities and would lead and laparotomy and not a great candidate at this time.  Concern for lymphoma on differential.  Overall poor prognosis with multiple comorbidities and adult failure to thrive. ? ?Left lower lung mass ?Patient would benefit from biopsy of lung mass, but likely unable to tolerate. ? ?Dysphagia ?Evaluated by SLP, recommend NPO.  Core track placed and currently not tolerating tube feeds with multiple vomiting episodes.  Patient very poorly responsive and overall poor prognosis and likely benefit from hospice/comfort care at this time.  Palliative care following.   ?--Plan to discharge home with home hospice once equipment has arrived ? 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 ?Hyponatremia ?Etiology likely secondary to poor oral intake with associated anasarca, adult failure to thrive.  Attempted tube feeds but unable to tolerate. ?--Continue IV fluid hydration ? ?GERD: Protonix daily ? ?Pressure injury ?Pressure Injury 05/24/21 Sacrum Mid Unstageable - Full thickness tissue loss in which the base of the injury is covered by slough (yellow, tan, gray, green or brown) and/or eschar (tan, brown or black) in the wound bed. (Active)  ?05/24/21 1800  ?Location: Sacrum  ?Location Orientation: Mid  ?Staging: Unstageable - Full thickness tissue loss  in which the base of the injury is covered by slough (yellow, tan, gray, green or brown) and/or eschar (tan, brown or black) in the wound bed.  ?Wound Description (Comments):   ?Present on Admission: Yes  ?   ?Pressure Injury 05/29/21 Shoulder Left;Posterior Stage 2 -  Partial thickness loss of dermis presenting as a shallow open injury with a red, pink wound bed without slough. (Active)  ?05/29/21 0445  ?Location: Shoulder  ?Location Orientation: Left;Posterior  ?Staging: Stage 2 -  Partial thickness loss of dermis presenting as a shallow open injury with a red, pink wound bed without slough.  ?Wound Description (Comments):   ?Present on Admission: No  ?-- Wound care consulted, continue offloading, local wound care ? ? ?DVT prophylaxis: Place TED hose Start: 05/28/21 1310 ?SCDs Start: 05/24/21 2245 ? ?  Code Status: Full Code ?Family Communication:  ? ?Disposition Plan:  ?Level of care: Progressive ?Status is: Inpatient ?Remains inpatient appropriate because: IV fluid hydration, pending discharge to home hospice once equipment has arrived. ?  ? ?Consultants:  ?Infectious disease ?PCCM ?Palliative care ? ?Procedures:  ?None ? ?Antimicrobials:  ?Azithromycin 4/22, 4/24>> ?Ceftriaxone 4/21 - 4/22 ?Unasyn 4/23>> ?Remdesivir 4/23>> ?Fluconazole ?Ethambutol ?Bactrim ? ? ?Subjective: ?Patient seen examined bedside, resting comfortably.  Poorly interactive.  No family present.  Long conversation yesterday with patient's sister [name] regarding her brother's grim prognosis; as he is actively dying and not tolerating tube feeds with recurrent episodes of vomiting.  Plan is to discharge home with home hospice once equipment has arrived.  Overall very poor/grim prognosis with continued decline.  Unable to obtain any further ROS from patient due to his current mental status.  No other acute concerns overnight per nursing staff. ? ?Objective: ?Vitals:  ? 05/30/21 2008 05/30/21 2200 05/31/21 0100 05/31/21 0841  ?BP: (!) 123/91   (!) 136/99 112/79  ?Pulse:   87   ?Resp: 20  17   ?Temp:  97.6 ?F (36.4 ?C) 97.8 ?F (36.6 ?C) 98.2 ?F (36.8 ?C)  ?TempSrc:  Oral Axillary Oral  ?SpO2: 100%  100%   ?Weight:      ?Height:      ? ? ?Intake/Output Summary (Last

## 2021-06-03 LAB — CULTURE, BODY FLUID W GRAM STAIN -BOTTLE: Culture: NO GROWTH

## 2021-06-04 ENCOUNTER — Other Ambulatory Visit (HOSPITAL_COMMUNITY): Payer: Self-pay

## 2021-06-05 ENCOUNTER — Ambulatory Visit: Payer: Medicaid Other | Admitting: Internal Medicine

## 2021-06-28 LAB — FUNGUS CULTURE RESULT

## 2021-06-28 LAB — FUNGUS CULTURE WITH STAIN

## 2021-06-28 LAB — FUNGAL ORGANISM REFLEX

## 2021-07-04 DEATH — deceased

## 2021-10-01 ENCOUNTER — Other Ambulatory Visit (HOSPITAL_COMMUNITY): Payer: Self-pay

## 2021-12-03 LAB — HISTOPLASMA ANTIGEN, URINE: Histoplasma Antigen, urine: <0.5 (ref <0.5)
